# Patient Record
Sex: Male | Born: 1935 | Race: White | Hispanic: No | Marital: Married | Smoking: Former smoker
Health system: Southern US, Community
[De-identification: ages and names within clinical notes are randomized; demographics above are authoritative.]

## PROBLEM LIST (undated history)

## (undated) ENCOUNTER — Emergency Department (HOSPITAL_COMMUNITY): Payer: Medicare Other | Source: Home / Self Care

## (undated) DIAGNOSIS — R972 Elevated prostate specific antigen [PSA]: Secondary | ICD-10-CM

## (undated) DIAGNOSIS — I272 Pulmonary hypertension, unspecified: Secondary | ICD-10-CM

## (undated) DIAGNOSIS — I4821 Permanent atrial fibrillation: Secondary | ICD-10-CM

## (undated) DIAGNOSIS — C61 Malignant neoplasm of prostate: Secondary | ICD-10-CM

## (undated) DIAGNOSIS — D126 Benign neoplasm of colon, unspecified: Secondary | ICD-10-CM

## (undated) DIAGNOSIS — Z8669 Personal history of other diseases of the nervous system and sense organs: Secondary | ICD-10-CM

## (undated) DIAGNOSIS — Z953 Presence of xenogenic heart valve: Secondary | ICD-10-CM

## (undated) DIAGNOSIS — G2581 Restless legs syndrome: Secondary | ICD-10-CM

## (undated) DIAGNOSIS — K2270 Barrett's esophagus without dysplasia: Secondary | ICD-10-CM

## (undated) DIAGNOSIS — R739 Hyperglycemia, unspecified: Secondary | ICD-10-CM

## (undated) DIAGNOSIS — I10 Essential (primary) hypertension: Secondary | ICD-10-CM

## (undated) DIAGNOSIS — R609 Edema, unspecified: Secondary | ICD-10-CM

## (undated) DIAGNOSIS — Z952 Presence of prosthetic heart valve: Secondary | ICD-10-CM

## (undated) DIAGNOSIS — E0590 Thyrotoxicosis, unspecified without thyrotoxic crisis or storm: Secondary | ICD-10-CM

## (undated) DIAGNOSIS — Z8679 Personal history of other diseases of the circulatory system: Secondary | ICD-10-CM

## (undated) DIAGNOSIS — Z950 Presence of cardiac pacemaker: Secondary | ICD-10-CM

## (undated) DIAGNOSIS — K219 Gastro-esophageal reflux disease without esophagitis: Secondary | ICD-10-CM

## (undated) DIAGNOSIS — D509 Iron deficiency anemia, unspecified: Secondary | ICD-10-CM

## (undated) DIAGNOSIS — Z9889 Other specified postprocedural states: Secondary | ICD-10-CM

## (undated) DIAGNOSIS — R296 Repeated falls: Secondary | ICD-10-CM

## (undated) DIAGNOSIS — D131 Benign neoplasm of stomach: Secondary | ICD-10-CM

## (undated) DIAGNOSIS — I495 Sick sinus syndrome: Secondary | ICD-10-CM

## (undated) DIAGNOSIS — C4491 Basal cell carcinoma of skin, unspecified: Secondary | ICD-10-CM

## (undated) DIAGNOSIS — I872 Venous insufficiency (chronic) (peripheral): Secondary | ICD-10-CM

## (undated) DIAGNOSIS — I350 Nonrheumatic aortic (valve) stenosis: Secondary | ICD-10-CM

## (undated) DIAGNOSIS — Z951 Presence of aortocoronary bypass graft: Secondary | ICD-10-CM

## (undated) DIAGNOSIS — Z8719 Personal history of other diseases of the digestive system: Secondary | ICD-10-CM

## (undated) DIAGNOSIS — I739 Peripheral vascular disease, unspecified: Secondary | ICD-10-CM

## (undated) DIAGNOSIS — E785 Hyperlipidemia, unspecified: Secondary | ICD-10-CM

## (undated) DIAGNOSIS — I059 Rheumatic mitral valve disease, unspecified: Secondary | ICD-10-CM

## (undated) DIAGNOSIS — I2510 Atherosclerotic heart disease of native coronary artery without angina pectoris: Secondary | ICD-10-CM

## (undated) DIAGNOSIS — I5032 Chronic diastolic (congestive) heart failure: Secondary | ICD-10-CM

## (undated) DIAGNOSIS — I255 Ischemic cardiomyopathy: Secondary | ICD-10-CM

## (undated) DIAGNOSIS — F411 Generalized anxiety disorder: Principal | ICD-10-CM

## (undated) DIAGNOSIS — W19XXXA Unspecified fall, initial encounter: Secondary | ICD-10-CM

## (undated) HISTORY — DX: Sick sinus syndrome: I49.5

## (undated) HISTORY — PX: CYSTECTOMY: SHX5119

## (undated) HISTORY — DX: Elevated prostate specific antigen (PSA): R97.20

## (undated) HISTORY — DX: Restless legs syndrome: G25.81

## (undated) HISTORY — DX: Generalized anxiety disorder: F41.1

## (undated) HISTORY — DX: Hyperglycemia, unspecified: R73.9

## (undated) HISTORY — DX: Gastro-esophageal reflux disease without esophagitis: K21.9

## (undated) HISTORY — DX: Atherosclerotic heart disease of native coronary artery without angina pectoris: I25.10

## (undated) HISTORY — DX: Edema, unspecified: R60.9

## (undated) HISTORY — DX: Nonrheumatic aortic (valve) stenosis: I35.0

## (undated) HISTORY — PX: TONSILLECTOMY: SUR1361

## (undated) HISTORY — DX: Venous insufficiency (chronic) (peripheral): I87.2

## (undated) HISTORY — DX: Basal cell carcinoma of skin, unspecified: C44.91

## (undated) HISTORY — DX: Presence of xenogenic heart valve: Z95.3

## (undated) HISTORY — DX: Ischemic cardiomyopathy: I25.5

## (undated) HISTORY — DX: Iron deficiency anemia, unspecified: D50.9

## (undated) HISTORY — DX: Barrett's esophagus without dysplasia: K22.70

## (undated) HISTORY — DX: Presence of aortocoronary bypass graft: Z95.1

## (undated) HISTORY — DX: Other specified postprocedural states: Z98.890

## (undated) HISTORY — DX: Hyperlipidemia, unspecified: E78.5

## (undated) HISTORY — DX: Personal history of other diseases of the circulatory system: Z86.79

## (undated) HISTORY — DX: Essential (primary) hypertension: I10

## (undated) HISTORY — DX: Benign neoplasm of stomach: D13.1

## (undated) HISTORY — DX: Benign neoplasm of colon, unspecified: D12.6

## (undated) HISTORY — DX: Personal history of other diseases of the nervous system and sense organs: Z86.69

## (undated) HISTORY — DX: Peripheral vascular disease, unspecified: I73.9

## (undated) HISTORY — PX: SKIN CANCER EXCISION: SHX779

---

## 1990-11-01 HISTORY — PX: CARDIAC CATHETERIZATION: SHX172

## 1999-07-08 ENCOUNTER — Encounter (INDEPENDENT_AMBULATORY_CARE_PROVIDER_SITE_OTHER): Payer: Self-pay

## 1999-07-08 ENCOUNTER — Other Ambulatory Visit: Admission: RE | Admit: 1999-07-08 | Discharge: 1999-07-08 | Payer: Self-pay | Admitting: Gastroenterology

## 1999-11-10 ENCOUNTER — Ambulatory Visit (HOSPITAL_COMMUNITY): Admission: RE | Admit: 1999-11-10 | Discharge: 1999-11-10 | Payer: Self-pay | Admitting: Endocrinology

## 1999-11-10 ENCOUNTER — Encounter: Payer: Self-pay | Admitting: Endocrinology

## 2001-07-01 ENCOUNTER — Ambulatory Visit (HOSPITAL_BASED_OUTPATIENT_CLINIC_OR_DEPARTMENT_OTHER): Admission: RE | Admit: 2001-07-01 | Discharge: 2001-07-01 | Payer: Self-pay | Admitting: Endocrinology

## 2002-11-06 ENCOUNTER — Emergency Department (HOSPITAL_COMMUNITY): Admission: EM | Admit: 2002-11-06 | Discharge: 2002-11-06 | Payer: Self-pay | Admitting: Emergency Medicine

## 2004-07-06 ENCOUNTER — Observation Stay (HOSPITAL_COMMUNITY): Admission: EM | Admit: 2004-07-06 | Discharge: 2004-07-08 | Payer: Self-pay

## 2004-07-12 ENCOUNTER — Emergency Department (HOSPITAL_COMMUNITY): Admission: EM | Admit: 2004-07-12 | Discharge: 2004-07-12 | Payer: Self-pay | Admitting: Emergency Medicine

## 2004-08-03 ENCOUNTER — Ambulatory Visit (HOSPITAL_COMMUNITY): Admission: RE | Admit: 2004-08-03 | Discharge: 2004-08-03 | Payer: Self-pay | Admitting: General Surgery

## 2004-09-07 ENCOUNTER — Ambulatory Visit: Payer: Self-pay | Admitting: Gastroenterology

## 2004-09-13 ENCOUNTER — Ambulatory Visit: Payer: Self-pay | Admitting: Endocrinology

## 2005-05-01 ENCOUNTER — Emergency Department (HOSPITAL_COMMUNITY): Admission: EM | Admit: 2005-05-01 | Discharge: 2005-05-01 | Payer: Self-pay | Admitting: Emergency Medicine

## 2005-05-05 ENCOUNTER — Ambulatory Visit: Payer: Self-pay | Admitting: Endocrinology

## 2005-05-09 ENCOUNTER — Ambulatory Visit (HOSPITAL_COMMUNITY): Admission: RE | Admit: 2005-05-09 | Discharge: 2005-05-09 | Payer: Self-pay | Admitting: Endocrinology

## 2005-05-09 ENCOUNTER — Ambulatory Visit: Payer: Self-pay | Admitting: Endocrinology

## 2005-08-23 ENCOUNTER — Ambulatory Visit: Payer: Self-pay | Admitting: Endocrinology

## 2005-09-01 ENCOUNTER — Ambulatory Visit: Payer: Self-pay | Admitting: Gastroenterology

## 2005-09-08 ENCOUNTER — Ambulatory Visit: Payer: Self-pay | Admitting: Endocrinology

## 2005-09-09 HISTORY — PX: CARDIOVASCULAR STRESS TEST: SHX262

## 2005-09-15 ENCOUNTER — Ambulatory Visit: Payer: Self-pay | Admitting: Endocrinology

## 2005-11-15 ENCOUNTER — Ambulatory Visit: Payer: Self-pay | Admitting: Endocrinology

## 2005-11-25 ENCOUNTER — Ambulatory Visit: Payer: Self-pay | Admitting: Endocrinology

## 2005-12-01 ENCOUNTER — Ambulatory Visit (HOSPITAL_COMMUNITY): Admission: RE | Admit: 2005-12-01 | Discharge: 2005-12-01 | Payer: Self-pay | Admitting: Orthopedic Surgery

## 2006-02-09 ENCOUNTER — Ambulatory Visit: Payer: Self-pay | Admitting: Endocrinology

## 2006-02-10 ENCOUNTER — Ambulatory Visit: Admission: RE | Admit: 2006-02-10 | Discharge: 2006-05-11 | Payer: Self-pay | Admitting: Radiation Oncology

## 2006-02-22 ENCOUNTER — Ambulatory Visit: Payer: Self-pay | Admitting: Gastroenterology

## 2006-03-21 ENCOUNTER — Ambulatory Visit: Payer: Self-pay | Admitting: Gastroenterology

## 2006-03-21 ENCOUNTER — Encounter (INDEPENDENT_AMBULATORY_CARE_PROVIDER_SITE_OTHER): Payer: Self-pay | Admitting: Specialist

## 2006-04-05 ENCOUNTER — Ambulatory Visit: Payer: Self-pay | Admitting: Endocrinology

## 2006-05-12 ENCOUNTER — Ambulatory Visit: Admission: RE | Admit: 2006-05-12 | Discharge: 2006-08-10 | Payer: Self-pay | Admitting: Radiation Oncology

## 2006-08-23 ENCOUNTER — Ambulatory Visit: Payer: Self-pay | Admitting: Endocrinology

## 2006-11-03 ENCOUNTER — Ambulatory Visit: Payer: Self-pay | Admitting: Gastroenterology

## 2006-11-15 ENCOUNTER — Ambulatory Visit: Payer: Self-pay | Admitting: Gastroenterology

## 2006-11-15 LAB — HM COLONOSCOPY

## 2007-01-08 ENCOUNTER — Ambulatory Visit: Payer: Self-pay | Admitting: Endocrinology

## 2007-06-01 ENCOUNTER — Encounter: Payer: Self-pay | Admitting: Endocrinology

## 2007-06-01 DIAGNOSIS — I2510 Atherosclerotic heart disease of native coronary artery without angina pectoris: Secondary | ICD-10-CM

## 2007-06-01 DIAGNOSIS — I10 Essential (primary) hypertension: Secondary | ICD-10-CM

## 2007-06-01 DIAGNOSIS — K219 Gastro-esophageal reflux disease without esophagitis: Secondary | ICD-10-CM

## 2007-06-01 DIAGNOSIS — E785 Hyperlipidemia, unspecified: Secondary | ICD-10-CM

## 2007-06-01 HISTORY — DX: Gastro-esophageal reflux disease without esophagitis: K21.9

## 2007-06-01 HISTORY — DX: Essential (primary) hypertension: I10

## 2007-06-01 HISTORY — DX: Atherosclerotic heart disease of native coronary artery without angina pectoris: I25.10

## 2007-06-01 HISTORY — DX: Hyperlipidemia, unspecified: E78.5

## 2007-06-09 ENCOUNTER — Emergency Department (HOSPITAL_COMMUNITY): Admission: EM | Admit: 2007-06-09 | Discharge: 2007-06-09 | Payer: Self-pay | Admitting: Emergency Medicine

## 2007-06-19 ENCOUNTER — Ambulatory Visit: Payer: Self-pay | Admitting: Endocrinology

## 2007-06-25 ENCOUNTER — Emergency Department (HOSPITAL_COMMUNITY): Admission: EM | Admit: 2007-06-25 | Discharge: 2007-06-25 | Payer: Self-pay | Admitting: Emergency Medicine

## 2007-07-02 ENCOUNTER — Ambulatory Visit: Payer: Self-pay | Admitting: Endocrinology

## 2007-07-11 ENCOUNTER — Encounter (HOSPITAL_BASED_OUTPATIENT_CLINIC_OR_DEPARTMENT_OTHER): Admission: RE | Admit: 2007-07-11 | Discharge: 2007-08-07 | Payer: Self-pay | Admitting: Surgery

## 2007-08-07 ENCOUNTER — Encounter (HOSPITAL_BASED_OUTPATIENT_CLINIC_OR_DEPARTMENT_OTHER): Admission: RE | Admit: 2007-08-07 | Discharge: 2007-09-24 | Payer: Self-pay | Admitting: Surgery

## 2007-08-16 ENCOUNTER — Ambulatory Visit: Payer: Self-pay | Admitting: Endocrinology

## 2007-08-23 ENCOUNTER — Ambulatory Visit: Payer: Self-pay | Admitting: Internal Medicine

## 2007-09-18 ENCOUNTER — Encounter: Payer: Self-pay | Admitting: Endocrinology

## 2008-01-01 ENCOUNTER — Encounter: Payer: Self-pay | Admitting: Endocrinology

## 2008-01-01 HISTORY — PX: CARDIOVASCULAR STRESS TEST: SHX262

## 2008-01-10 ENCOUNTER — Encounter: Payer: Self-pay | Admitting: Endocrinology

## 2008-01-14 DIAGNOSIS — Z8669 Personal history of other diseases of the nervous system and sense organs: Secondary | ICD-10-CM

## 2008-01-14 DIAGNOSIS — D126 Benign neoplasm of colon, unspecified: Secondary | ICD-10-CM

## 2008-01-14 DIAGNOSIS — Z8546 Personal history of malignant neoplasm of prostate: Secondary | ICD-10-CM | POA: Insufficient documentation

## 2008-01-14 HISTORY — DX: Benign neoplasm of colon, unspecified: D12.6

## 2008-01-14 HISTORY — DX: Personal history of other diseases of the nervous system and sense organs: Z86.69

## 2008-01-16 ENCOUNTER — Encounter: Admission: RE | Admit: 2008-01-16 | Discharge: 2008-01-16 | Payer: Self-pay | Admitting: Cardiovascular Disease

## 2008-01-21 ENCOUNTER — Ambulatory Visit (HOSPITAL_COMMUNITY): Admission: RE | Admit: 2008-01-21 | Discharge: 2008-01-21 | Payer: Self-pay | Admitting: Cardiology

## 2008-01-21 ENCOUNTER — Encounter (INDEPENDENT_AMBULATORY_CARE_PROVIDER_SITE_OTHER): Payer: Self-pay | Admitting: Cardiology

## 2008-02-29 ENCOUNTER — Ambulatory Visit (HOSPITAL_COMMUNITY): Admission: RE | Admit: 2008-02-29 | Discharge: 2008-02-29 | Payer: Self-pay | Admitting: *Deleted

## 2008-04-16 ENCOUNTER — Encounter: Payer: Self-pay | Admitting: Endocrinology

## 2008-04-17 ENCOUNTER — Encounter: Payer: Self-pay | Admitting: Endocrinology

## 2008-05-21 ENCOUNTER — Encounter: Payer: Self-pay | Admitting: Endocrinology

## 2008-06-13 ENCOUNTER — Encounter: Payer: Self-pay | Admitting: Endocrinology

## 2008-06-13 ENCOUNTER — Encounter: Payer: Self-pay | Admitting: Cardiovascular Disease

## 2008-06-13 DIAGNOSIS — I059 Rheumatic mitral valve disease, unspecified: Secondary | ICD-10-CM

## 2008-06-13 DIAGNOSIS — Z951 Presence of aortocoronary bypass graft: Secondary | ICD-10-CM

## 2008-06-13 DIAGNOSIS — Z8679 Personal history of other diseases of the circulatory system: Secondary | ICD-10-CM

## 2008-06-13 DIAGNOSIS — Z953 Presence of xenogenic heart valve: Secondary | ICD-10-CM

## 2008-06-13 HISTORY — DX: Personal history of other diseases of the circulatory system: Z86.79

## 2008-06-13 HISTORY — DX: Presence of xenogenic heart valve: Z95.3

## 2008-06-13 HISTORY — PX: MITRAL VALVE REPLACEMENT: SHX147

## 2008-06-13 HISTORY — PX: CORONARY ARTERY BYPASS GRAFT: SHX141

## 2008-06-13 HISTORY — DX: Rheumatic mitral valve disease, unspecified: I05.9

## 2008-06-13 HISTORY — DX: Presence of aortocoronary bypass graft: Z95.1

## 2008-06-25 ENCOUNTER — Encounter: Payer: Self-pay | Admitting: Endocrinology

## 2008-06-29 ENCOUNTER — Encounter: Payer: Self-pay | Admitting: Endocrinology

## 2008-07-13 ENCOUNTER — Encounter: Payer: Self-pay | Admitting: Endocrinology

## 2008-07-13 DIAGNOSIS — I495 Sick sinus syndrome: Secondary | ICD-10-CM

## 2008-07-13 HISTORY — DX: Sick sinus syndrome: I49.5

## 2008-07-13 HISTORY — PX: PACEMAKER PLACEMENT: SHX43

## 2008-07-15 ENCOUNTER — Encounter: Payer: Self-pay | Admitting: Endocrinology

## 2008-07-23 ENCOUNTER — Encounter: Payer: Self-pay | Admitting: Endocrinology

## 2008-07-28 ENCOUNTER — Encounter: Payer: Self-pay | Admitting: Endocrinology

## 2008-08-01 ENCOUNTER — Ambulatory Visit: Payer: Self-pay | Admitting: Cardiothoracic Surgery

## 2008-08-07 ENCOUNTER — Ambulatory Visit: Payer: Self-pay | Admitting: Endocrinology

## 2008-08-08 ENCOUNTER — Ambulatory Visit: Payer: Self-pay | Admitting: Cardiothoracic Surgery

## 2008-08-08 ENCOUNTER — Encounter: Admission: RE | Admit: 2008-08-08 | Discharge: 2008-08-08 | Payer: Self-pay | Admitting: Cardiothoracic Surgery

## 2008-08-12 ENCOUNTER — Ambulatory Visit: Payer: Self-pay | Admitting: Endocrinology

## 2008-08-15 ENCOUNTER — Encounter: Payer: Self-pay | Admitting: Endocrinology

## 2008-08-18 ENCOUNTER — Telehealth (INDEPENDENT_AMBULATORY_CARE_PROVIDER_SITE_OTHER): Payer: Self-pay | Admitting: *Deleted

## 2008-08-18 DIAGNOSIS — Z8679 Personal history of other diseases of the circulatory system: Secondary | ICD-10-CM | POA: Insufficient documentation

## 2008-08-18 HISTORY — DX: Personal history of other diseases of the circulatory system: Z86.79

## 2008-08-20 ENCOUNTER — Encounter: Payer: Self-pay | Admitting: Endocrinology

## 2008-08-22 ENCOUNTER — Ambulatory Visit: Payer: Self-pay | Admitting: Cardiothoracic Surgery

## 2008-08-26 ENCOUNTER — Encounter: Payer: Self-pay | Admitting: Endocrinology

## 2008-08-28 ENCOUNTER — Encounter (HOSPITAL_COMMUNITY): Admission: RE | Admit: 2008-08-28 | Discharge: 2008-11-05 | Payer: Self-pay | Admitting: Cardiovascular Disease

## 2008-09-04 ENCOUNTER — Ambulatory Visit: Payer: Self-pay | Admitting: Endocrinology

## 2008-09-04 ENCOUNTER — Telehealth (INDEPENDENT_AMBULATORY_CARE_PROVIDER_SITE_OTHER): Payer: Self-pay | Admitting: *Deleted

## 2008-09-04 DIAGNOSIS — F411 Generalized anxiety disorder: Secondary | ICD-10-CM | POA: Insufficient documentation

## 2008-09-04 DIAGNOSIS — S8000XA Contusion of unspecified knee, initial encounter: Secondary | ICD-10-CM

## 2008-09-04 DIAGNOSIS — S81819A Laceration without foreign body, unspecified lower leg, initial encounter: Secondary | ICD-10-CM | POA: Insufficient documentation

## 2008-09-04 HISTORY — DX: Generalized anxiety disorder: F41.1

## 2008-09-08 LAB — CONVERTED CEMR LAB
Basophils Absolute: 0 10*3/uL (ref 0.0–0.1)
Basophils Relative: 0.1 % (ref 0.0–3.0)
MCHC: 33.6 g/dL (ref 30.0–36.0)
MCV: 85.1 fL (ref 78.0–100.0)
Monocytes Absolute: 0.6 10*3/uL (ref 0.1–1.0)
Monocytes Relative: 10.6 % (ref 3.0–12.0)
RDW: 15 % — ABNORMAL HIGH (ref 11.5–14.6)
TSH: 1.25 microintl units/mL (ref 0.35–5.50)

## 2008-09-09 ENCOUNTER — Encounter (INDEPENDENT_AMBULATORY_CARE_PROVIDER_SITE_OTHER): Payer: Self-pay | Admitting: *Deleted

## 2008-09-14 ENCOUNTER — Ambulatory Visit: Payer: Self-pay | Admitting: Internal Medicine

## 2008-09-14 ENCOUNTER — Inpatient Hospital Stay (HOSPITAL_COMMUNITY): Admission: EM | Admit: 2008-09-14 | Discharge: 2008-09-16 | Payer: Self-pay | Admitting: Emergency Medicine

## 2008-09-15 ENCOUNTER — Encounter (INDEPENDENT_AMBULATORY_CARE_PROVIDER_SITE_OTHER): Payer: Self-pay | Admitting: Internal Medicine

## 2008-09-15 ENCOUNTER — Ambulatory Visit: Payer: Self-pay | Admitting: Vascular Surgery

## 2008-09-18 ENCOUNTER — Ambulatory Visit: Payer: Self-pay | Admitting: Endocrinology

## 2008-09-18 DIAGNOSIS — L03119 Cellulitis of unspecified part of limb: Secondary | ICD-10-CM

## 2008-09-18 DIAGNOSIS — I872 Venous insufficiency (chronic) (peripheral): Secondary | ICD-10-CM | POA: Insufficient documentation

## 2008-09-18 DIAGNOSIS — D649 Anemia, unspecified: Secondary | ICD-10-CM

## 2008-09-18 DIAGNOSIS — L02419 Cutaneous abscess of limb, unspecified: Secondary | ICD-10-CM | POA: Insufficient documentation

## 2008-09-18 HISTORY — DX: Venous insufficiency (chronic) (peripheral): I87.2

## 2008-09-18 LAB — CONVERTED CEMR LAB
Basophils Absolute: 0 10*3/uL (ref 0.0–0.1)
HCT: 29.8 % — ABNORMAL LOW (ref 39.0–52.0)
Hemoglobin: 10.2 g/dL — ABNORMAL LOW (ref 13.0–17.0)
Iron: 32 ug/dL — ABNORMAL LOW (ref 42–165)
Lymphocytes Relative: 13 % (ref 12.0–46.0)
MCHC: 34.2 g/dL (ref 30.0–36.0)
Platelets: 316 10*3/uL (ref 150–400)
RBC: 3.56 M/uL — ABNORMAL LOW (ref 4.22–5.81)
WBC: 7.3 10*3/uL (ref 4.5–10.5)

## 2008-09-22 ENCOUNTER — Encounter: Payer: Self-pay | Admitting: Endocrinology

## 2008-09-22 ENCOUNTER — Encounter (HOSPITAL_BASED_OUTPATIENT_CLINIC_OR_DEPARTMENT_OTHER): Admission: RE | Admit: 2008-09-22 | Discharge: 2008-10-17 | Payer: Self-pay | Admitting: Internal Medicine

## 2008-10-13 ENCOUNTER — Telehealth: Payer: Self-pay | Admitting: Endocrinology

## 2008-10-13 ENCOUNTER — Ambulatory Visit: Payer: Self-pay | Admitting: Endocrinology

## 2008-10-13 DIAGNOSIS — J069 Acute upper respiratory infection, unspecified: Secondary | ICD-10-CM | POA: Insufficient documentation

## 2008-10-13 DIAGNOSIS — D509 Iron deficiency anemia, unspecified: Secondary | ICD-10-CM

## 2008-10-13 DIAGNOSIS — J90 Pleural effusion, not elsewhere classified: Secondary | ICD-10-CM | POA: Insufficient documentation

## 2008-10-13 HISTORY — DX: Iron deficiency anemia, unspecified: D50.9

## 2008-10-13 LAB — CONVERTED CEMR LAB
Basophils Relative: 2.3 % (ref 0.0–3.0)
CO2: 28 meq/L (ref 19–32)
Chloride: 107 meq/L (ref 96–112)
Creatinine, Ser: 0.8 mg/dL (ref 0.4–1.5)
Eosinophils Absolute: 0 10*3/uL (ref 0.0–0.7)
Eosinophils Relative: 0 % (ref 0.0–5.0)
Glucose, Bld: 116 mg/dL — ABNORMAL HIGH (ref 70–99)
HCT: 31.6 % — ABNORMAL LOW (ref 39.0–52.0)
Iron: 9 ug/dL — ABNORMAL LOW (ref 42–165)
Lymphocytes Relative: 5.2 % — ABNORMAL LOW (ref 12.0–46.0)
MCV: 82.6 fL (ref 78.0–100.0)
Monocytes Absolute: 0.5 10*3/uL (ref 0.1–1.0)
Neutrophils Relative %: 88.2 % — ABNORMAL HIGH (ref 43.0–77.0)
RDW: 16.6 % — ABNORMAL HIGH (ref 11.5–14.6)
Saturation Ratios: 3.5 % — ABNORMAL LOW (ref 20.0–50.0)
Sodium: 142 meq/L (ref 135–145)
WBC: 11.5 10*3/uL — ABNORMAL HIGH (ref 4.5–10.5)

## 2008-10-14 ENCOUNTER — Telehealth (INDEPENDENT_AMBULATORY_CARE_PROVIDER_SITE_OTHER): Payer: Self-pay | Admitting: *Deleted

## 2008-10-14 ENCOUNTER — Ambulatory Visit: Payer: Self-pay | Admitting: Cardiothoracic Surgery

## 2008-10-15 ENCOUNTER — Ambulatory Visit: Payer: Self-pay | Admitting: Endocrinology

## 2008-10-16 ENCOUNTER — Ambulatory Visit: Payer: Self-pay | Admitting: Endocrinology

## 2008-10-21 ENCOUNTER — Ambulatory Visit: Payer: Self-pay | Admitting: Endocrinology

## 2008-10-21 ENCOUNTER — Ambulatory Visit: Payer: Self-pay

## 2008-10-21 DIAGNOSIS — R319 Hematuria, unspecified: Secondary | ICD-10-CM | POA: Insufficient documentation

## 2008-10-21 DIAGNOSIS — R609 Edema, unspecified: Secondary | ICD-10-CM | POA: Insufficient documentation

## 2008-10-21 LAB — CONVERTED CEMR LAB
BUN: 16 mg/dL (ref 6–23)
Bilirubin Urine: NEGATIVE
CO2: 30 meq/L (ref 19–32)
Calcium: 8.6 mg/dL (ref 8.4–10.5)
Creatinine, Ser: 0.8 mg/dL (ref 0.4–1.5)
Eosinophils Relative: 1.5 % (ref 0.0–5.0)
Glucose, Urine, Semiquant: NEGATIVE
INR: 1.9 — ABNORMAL HIGH (ref 0.8–1.0)
Ketones, urine, test strip: NEGATIVE
MCV: 81.5 fL (ref 78.0–100.0)
Neutrophils Relative %: 71.7 % (ref 43.0–77.0)
Nitrite: NEGATIVE
Platelets: 245 10*3/uL (ref 150–400)
Potassium: 4 meq/L (ref 3.5–5.1)
Pro B Natriuretic peptide (BNP): 186 pg/mL — ABNORMAL HIGH (ref 0.0–100.0)
Protein, U semiquant: 30
Prothrombin Time: 19.7 s — ABNORMAL HIGH (ref 10.9–13.3)
RBC: 3.8 M/uL — ABNORMAL LOW (ref 4.22–5.81)
RDW: 16.5 % — ABNORMAL HIGH (ref 11.5–14.6)
Sodium: 143 meq/L (ref 135–145)
Specific Gravity, Urine: >=1.03
Urobilinogen, UA: 0.2
pH: 5

## 2008-10-22 ENCOUNTER — Encounter: Payer: Self-pay | Admitting: Endocrinology

## 2008-11-04 ENCOUNTER — Telehealth (INDEPENDENT_AMBULATORY_CARE_PROVIDER_SITE_OTHER): Payer: Self-pay | Admitting: *Deleted

## 2008-11-07 ENCOUNTER — Encounter (HOSPITAL_COMMUNITY): Admission: RE | Admit: 2008-11-07 | Discharge: 2009-01-05 | Payer: Self-pay | Admitting: Cardiovascular Disease

## 2008-11-13 ENCOUNTER — Ambulatory Visit: Payer: Self-pay | Admitting: Endocrinology

## 2008-11-13 LAB — CONVERTED CEMR LAB
Eosinophils Absolute: 0.1 10*3/uL (ref 0.0–0.7)
HCT: 35.5 % — ABNORMAL LOW (ref 39.0–52.0)
Hemoglobin: 12 g/dL — ABNORMAL LOW (ref 13.0–17.0)
Iron: 72 ug/dL (ref 42–165)
MCV: 84.8 fL (ref 78.0–100.0)
Neutro Abs: 3.8 10*3/uL (ref 1.4–7.7)
RBC: 4.19 M/uL — ABNORMAL LOW (ref 4.22–5.81)
RDW: 19.1 % — ABNORMAL HIGH (ref 11.5–14.6)
WBC: 5.5 10*3/uL (ref 4.5–10.5)

## 2008-11-18 ENCOUNTER — Ambulatory Visit (HOSPITAL_COMMUNITY): Admission: RE | Admit: 2008-11-18 | Discharge: 2008-11-18 | Payer: Self-pay | Admitting: Cardiovascular Disease

## 2008-11-28 ENCOUNTER — Ambulatory Visit: Payer: Self-pay | Admitting: Internal Medicine

## 2008-11-28 ENCOUNTER — Encounter: Payer: Self-pay | Admitting: Endocrinology

## 2008-12-12 ENCOUNTER — Encounter: Payer: Self-pay | Admitting: Endocrinology

## 2009-02-18 ENCOUNTER — Encounter (INDEPENDENT_AMBULATORY_CARE_PROVIDER_SITE_OTHER): Payer: Self-pay | Admitting: *Deleted

## 2009-02-24 ENCOUNTER — Encounter: Payer: Self-pay | Admitting: Internal Medicine

## 2009-03-20 ENCOUNTER — Ambulatory Visit: Payer: Self-pay | Admitting: Internal Medicine

## 2009-03-20 DIAGNOSIS — K2270 Barrett's esophagus without dysplasia: Secondary | ICD-10-CM

## 2009-03-20 HISTORY — DX: Barrett's esophagus without dysplasia: K22.70

## 2009-03-26 ENCOUNTER — Encounter: Payer: Self-pay | Admitting: Endocrinology

## 2009-03-26 ENCOUNTER — Telehealth: Payer: Self-pay | Admitting: Internal Medicine

## 2009-03-30 ENCOUNTER — Telehealth (INDEPENDENT_AMBULATORY_CARE_PROVIDER_SITE_OTHER): Payer: Self-pay | Admitting: *Deleted

## 2009-04-02 ENCOUNTER — Ambulatory Visit: Payer: Self-pay | Admitting: Endocrinology

## 2009-04-02 DIAGNOSIS — R040 Epistaxis: Secondary | ICD-10-CM

## 2009-04-02 LAB — CONVERTED CEMR LAB
Basophils Absolute: 0 10*3/uL (ref 0.0–0.1)
Eosinophils Absolute: 0.1 10*3/uL (ref 0.0–0.7)
Hemoglobin: 11.7 g/dL — ABNORMAL LOW (ref 13.0–17.0)
Iron: 70 ug/dL (ref 42–165)
Lymphocytes Relative: 19.3 % (ref 12.0–46.0)
Lymphs Abs: 1 10*3/uL (ref 0.7–4.0)
Monocytes Relative: 10.7 % (ref 3.0–12.0)
Neutrophils Relative %: 68.6 % (ref 43.0–77.0)
RBC: 3.8 M/uL — ABNORMAL LOW (ref 4.22–5.81)
RDW: 14 % (ref 11.5–14.6)
Saturation Ratios: 25.4 % (ref 20.0–50.0)
Transferrin: 196.7 mg/dL — ABNORMAL LOW (ref 212.0–360.0)

## 2009-04-28 ENCOUNTER — Ambulatory Visit: Payer: Self-pay | Admitting: Internal Medicine

## 2009-04-28 ENCOUNTER — Encounter: Payer: Self-pay | Admitting: Internal Medicine

## 2009-05-04 ENCOUNTER — Telehealth: Payer: Self-pay | Admitting: Internal Medicine

## 2009-05-13 ENCOUNTER — Encounter: Payer: Self-pay | Admitting: Endocrinology

## 2009-05-13 ENCOUNTER — Encounter: Payer: Self-pay | Admitting: Internal Medicine

## 2009-05-13 DIAGNOSIS — D131 Benign neoplasm of stomach: Secondary | ICD-10-CM

## 2009-05-13 HISTORY — DX: Benign neoplasm of stomach: D13.1

## 2009-05-15 ENCOUNTER — Ambulatory Visit: Payer: Self-pay | Admitting: Internal Medicine

## 2009-05-18 ENCOUNTER — Telehealth: Payer: Self-pay | Admitting: Endocrinology

## 2009-05-21 ENCOUNTER — Ambulatory Visit: Payer: Self-pay | Admitting: Endocrinology

## 2009-05-21 ENCOUNTER — Telehealth: Payer: Self-pay | Admitting: Endocrinology

## 2009-05-21 DIAGNOSIS — R05 Cough: Secondary | ICD-10-CM | POA: Insufficient documentation

## 2009-05-22 ENCOUNTER — Ambulatory Visit: Payer: Self-pay | Admitting: Internal Medicine

## 2009-05-22 ENCOUNTER — Ambulatory Visit (HOSPITAL_COMMUNITY): Admission: RE | Admit: 2009-05-22 | Discharge: 2009-05-22 | Payer: Self-pay | Admitting: Internal Medicine

## 2009-05-22 ENCOUNTER — Encounter: Payer: Self-pay | Admitting: Internal Medicine

## 2009-05-22 ENCOUNTER — Telehealth: Payer: Self-pay | Admitting: Endocrinology

## 2009-05-25 ENCOUNTER — Encounter: Payer: Self-pay | Admitting: Internal Medicine

## 2009-05-28 ENCOUNTER — Telehealth: Payer: Self-pay | Admitting: Internal Medicine

## 2009-06-03 ENCOUNTER — Telehealth: Payer: Self-pay | Admitting: Internal Medicine

## 2009-06-11 ENCOUNTER — Encounter: Payer: Self-pay | Admitting: Endocrinology

## 2009-07-10 ENCOUNTER — Encounter: Payer: Self-pay | Admitting: Endocrinology

## 2009-07-14 HISTORY — PX: COLONOSCOPY: SHX174

## 2009-07-14 HISTORY — PX: UPPER GASTROINTESTINAL ENDOSCOPY: SHX188

## 2009-07-30 DIAGNOSIS — R609 Edema, unspecified: Secondary | ICD-10-CM

## 2009-07-30 DIAGNOSIS — R600 Localized edema: Secondary | ICD-10-CM

## 2009-07-30 HISTORY — DX: Edema, unspecified: R60.9

## 2009-07-30 HISTORY — DX: Localized edema: R60.0

## 2009-08-18 ENCOUNTER — Ambulatory Visit: Payer: Self-pay | Admitting: Endocrinology

## 2009-12-31 ENCOUNTER — Telehealth: Payer: Self-pay | Admitting: Internal Medicine

## 2010-01-01 ENCOUNTER — Ambulatory Visit: Payer: Self-pay | Admitting: Endocrinology

## 2010-01-01 ENCOUNTER — Encounter: Payer: Self-pay | Admitting: Endocrinology

## 2010-01-01 ENCOUNTER — Ambulatory Visit: Payer: Self-pay | Admitting: Internal Medicine

## 2010-01-01 DIAGNOSIS — Z860100 Personal history of colon polyps, unspecified: Secondary | ICD-10-CM | POA: Insufficient documentation

## 2010-01-02 LAB — CONVERTED CEMR LAB
AST: 22 units/L (ref 0–37)
Alkaline Phosphatase: 66 units/L (ref 39–117)
Basophils Relative: 0.1 % (ref 0.0–3.0)
Bilirubin, Direct: 0.2 mg/dL (ref 0.0–0.3)
CO2: 28 meq/L (ref 19–32)
Cholesterol: 109 mg/dL (ref 0–200)
GFR calc non Af Amer: 68.66 mL/min (ref >60)
Glucose, Bld: 101 mg/dL — ABNORMAL HIGH (ref 70–99)
Hemoglobin: 11.2 g/dL — ABNORMAL LOW (ref 13.0–17.0)
Lymphocytes Relative: 9.3 % — ABNORMAL LOW (ref 12.0–46.0)
Monocytes Relative: 8.6 % (ref 3.0–12.0)
Neutro Abs: 6.3 10*3/uL (ref 1.4–7.7)
Neutrophils Relative %: 81 % — ABNORMAL HIGH (ref 43.0–77.0)
Saturation Ratios: 3.9 % — ABNORMAL LOW (ref 20.0–50.0)
Sodium: 143 meq/L (ref 135–145)
TSH: 0.76 microintl units/mL (ref 0.35–5.50)
Total Protein: 6.1 g/dL (ref 6.0–8.3)
Triglycerides: 29 mg/dL (ref 0.0–149.0)
VLDL: 5.8 mg/dL (ref 0.0–40.0)
WBC: 7.8 10*3/uL (ref 4.5–10.5)

## 2010-01-21 ENCOUNTER — Encounter: Payer: Self-pay | Admitting: Endocrinology

## 2010-03-11 ENCOUNTER — Telehealth: Payer: Self-pay | Admitting: Endocrinology

## 2010-04-14 ENCOUNTER — Encounter (INDEPENDENT_AMBULATORY_CARE_PROVIDER_SITE_OTHER): Payer: Self-pay | Admitting: *Deleted

## 2010-04-27 ENCOUNTER — Encounter: Payer: Self-pay | Admitting: Endocrinology

## 2010-05-24 ENCOUNTER — Ambulatory Visit: Payer: Self-pay | Admitting: Internal Medicine

## 2010-05-24 ENCOUNTER — Encounter (INDEPENDENT_AMBULATORY_CARE_PROVIDER_SITE_OTHER): Payer: Self-pay | Admitting: *Deleted

## 2010-06-03 ENCOUNTER — Encounter: Payer: Self-pay | Admitting: Endocrinology

## 2010-06-03 HISTORY — PX: CARDIOVASCULAR STRESS TEST: SHX262

## 2010-06-11 ENCOUNTER — Encounter: Payer: Self-pay | Admitting: Endocrinology

## 2010-06-16 DIAGNOSIS — I739 Peripheral vascular disease, unspecified: Secondary | ICD-10-CM

## 2010-06-16 HISTORY — DX: Peripheral vascular disease, unspecified: I73.9

## 2010-06-24 ENCOUNTER — Telehealth: Payer: Self-pay | Admitting: Endocrinology

## 2010-06-29 ENCOUNTER — Ambulatory Visit: Payer: Self-pay | Admitting: Internal Medicine

## 2010-07-02 ENCOUNTER — Encounter: Payer: Self-pay | Admitting: Internal Medicine

## 2010-07-06 ENCOUNTER — Encounter: Payer: Self-pay | Admitting: Endocrinology

## 2010-08-12 ENCOUNTER — Ambulatory Visit: Payer: Self-pay | Admitting: Endocrinology

## 2010-08-26 ENCOUNTER — Encounter: Payer: Self-pay | Admitting: Endocrinology

## 2010-09-28 ENCOUNTER — Ambulatory Visit: Payer: Self-pay | Admitting: Endocrinology

## 2010-09-28 DIAGNOSIS — C4491 Basal cell carcinoma of skin, unspecified: Secondary | ICD-10-CM

## 2010-09-28 HISTORY — DX: Basal cell carcinoma of skin, unspecified: C44.91

## 2010-09-28 LAB — CONVERTED CEMR LAB
Basophils Absolute: 0 10*3/uL (ref 0.0–0.1)
Eosinophils Absolute: 0.1 10*3/uL (ref 0.0–0.7)
Hemoglobin: 12.9 g/dL — ABNORMAL LOW (ref 13.0–17.0)
Lymphocytes Relative: 20.9 % (ref 12.0–46.0)
MCV: 93.5 fL (ref 78.0–100.0)
Monocytes Absolute: 0.6 10*3/uL (ref 0.1–1.0)
Monocytes Relative: 10.1 % (ref 3.0–12.0)
Neutro Abs: 3.7 10*3/uL (ref 1.4–7.7)
Platelets: 169 10*3/uL (ref 150.0–400.0)
RBC: 4.03 M/uL — ABNORMAL LOW (ref 4.22–5.81)
TSH: 1.32 microintl units/mL (ref 0.35–5.50)
Transferrin: 250.5 mg/dL (ref 212.0–360.0)
WBC: 5.5 10*3/uL (ref 4.5–10.5)

## 2010-10-22 ENCOUNTER — Ambulatory Visit: Payer: Self-pay | Admitting: Endocrinology

## 2010-10-22 DIAGNOSIS — L738 Other specified follicular disorders: Secondary | ICD-10-CM | POA: Insufficient documentation

## 2010-11-05 ENCOUNTER — Telehealth: Payer: Self-pay | Admitting: Internal Medicine

## 2010-12-09 NOTE — Assessment & Plan Note (Signed)
Summary: constipation/rectal bleeding , pt on coumadin/sheri    History of Present Illness Visit Type: Follow-up Visit Primary GI MD: Stan Head MD [facility] Primary Provider: Romero Belling, MD Chief Complaint: constipation and rectal bleeding pt. was seen today by Dr. Everardo All for cold symtpoms and placed on Antibiotic History of Present Illness:   77 YO MALE KNOWN TO DR. Leone Payor WITH HX OF BARRETTS ESOPHAGUS,DIVERTICULAR DISEASE,HX OF COLON POLYPS,AND EXTERNAL HEMORRHOIDS. HE IS ON COUMADIN FOR MITRAL VALVE REPLACEMENT.HE COMES IN TODAY WITH C/O CONSTIPATION AND RECTAL BLEEDING. HE HAS HAD PROBLEMS WITH CONSTIPATION FOR THE PAST FEW MONTHS DESPITE USING FIBER SUPPLEMENT,AND STOOL SOFTENERS REGULARLY. HE HAD A HARD STOOL 2 DAYS AGO, AND AFTER THAT STARTED HAVING DARK RED BLOOD AND SMALL CLOTS PER RECTUM.HE HAS NO C/O ABDOMINAL PAIN,DISCOMFORT,NO RECTAL DISCOMFORT.   GI Review of Systems      Denies abdominal pain, acid reflux, belching, bloating, chest pain, dysphagia with liquids, dysphagia with solids, heartburn, loss of appetite, nausea, vomiting, vomiting blood, and  weight loss.      Reports change in bowel habits, constipation, diverticulosis, heme positive stool, hemorrhoids, and  rectal bleeding.     Denies anal fissure, black tarry stools, diarrhea, fecal incontinence, irritable bowel syndrome, jaundice, light color stool, liver problems, and  rectal pain.    Current Medications (verified): 1)  Protonix 40 Mg  Tbec (Pantoprazole Sodium) .... Take 1 Tablet By Mouth Two Times A Day 2)  Bayer Low Strength 81 Mg  Tbec (Aspirin) .... Take 1 By Mouth Qd 3)  Digoxin 0.125 Mg Tabs (Digoxin) .... 1/2 By Mouth Once Daily 4)  Coumadin 3 Mg Tabs (Warfarin Sodium) .Marland Kitchen.. 1 Tablet By Mouth As Directed 5)  Metoprolol Tartrate 25 Mg Tabs (Metoprolol Tartrate) .Marland Kitchen.. 1 Tablet By Mouth Two Times A Day 6)  Amiodarone Hcl 200 Mg Tabs (Amiodarone Hcl) .Marland Kitchen.. 1 Tablet By Mouth Once Daily 7)  Calcium 600/vitamin  D 600-400 Mg-Unit Tabs (Calcium Carbonate-Vitamin D) .Marland Kitchen.. 1 Tablet By Mouth Two Times A Day 8)  Simvastatin 40 Mg Tabs (Simvastatin) .Marland Kitchen.. 1 Qhs 9)  Cefuroxime Axetil 250 Mg Tabs (Cefuroxime Axetil) .Marland Kitchen.. 1 Qd  Allergies (verified): No Known Drug Allergies  Past History:  Past Medical History: Coronary artery disease Mitral Regurgitation s/p MVR, bioprosthetic GERD BARRETTS ESOPHAGUS DIVERTICULOSIS HX COLON POLYPS Hyperlipidemia Hypertension Pipe smoker (quit 1980) Barrett's Esophagus Chronic atrophy left testicle Restless leg syndrome Hyperglycemia Elevated PSA  Past Surgical History: Reviewed history from 05/13/2009 and no changes required. Cardiac catherization (11/01/1990) Stress Cardiolite (07/04/1996) EKG (05/17/2004) Prostatectomy Tonsillectomy Left leg cystectomy Pacemaker placement ([place]) CABG, S Mitral Valve Replacement, bioprosthetic, Maze procedure 8/09 [facility]  Family History: Reviewed history from 03/20/2009 and no changes required. No FH of Colon Cancer: Family History of Breast Cancer:mother  Social History: Reviewed history from 03/20/2009 and no changes required. 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         ROS OTHERWISE AS IN HPI  Vital Signs:  Patient profile:   77 year old male Height:  70 inches Weight:      173.38  pounds BMI:     24.97 Pulse rate:   80 / minute Pulse rhythm:   regular BP sitting:   128 / 94  (left arm)  Vitals Entered By: Milford Cage NCMA (January 01, 2010 11:13 AM)  Physical Exam  General:  Well developed, well nourished, no acute distress. Head:  Normocephalic and atraumatic. Eyes:  PERRLA, no icterus. Lungs:  Clear throughout to auscultation. Heart:  Regular rate and rhythm; no murmurs, rubs,  or bruits.,PROSTHETIC VALVE SOUND Abdomen:  SOFT, NONTENDER, NO MASS OR HSM,BS+ Rectal:  EXTERNAL HEMORRHOID,NONTENDER, RED BLOOD ON GLOVE NO STOOL IN VAULT Extremities:  MULTIPLE ECCHYMOSIS Neurologic:  Alert and  oriented x4;  grossly normal neurologically. Psych:  Alert and cooperative. Normal mood and affect.   Impression & Recommendations:  Problem # 1:  RECTAL BLEEDING (ICD-569.3) Assessment New 77 YO MALE WITH 2 DAY HX OF RECTAL BLEEDING  SECONDARY TO EXTERNAL HEMORRHOID-EXACERBATED BY CONSTIPATION AND COUMADIN USE.  START ANUSOL HC SUPP AT BEDTIME DAILY X 10 DAYS THEN AS NEEDED. START MIRALAX 17 GM DAILY IN 8 OZ WATER-MAY STOP STOOL SOFTENER CONTINUE EFFORTS AT HIGH FIBER DIET AND INCREASING FLUIDS. FOLLOW UP WITH DRGESSNER IN 2-3 WEEKS IF SXS PERSIST.  Problem # 2:  PERSONAL HX COLONIC POLYPS (ICD-V12.72) Assessment: Comment Only LAST COLONOSCOPY 1/08-NO POLYPS;DUE FOR FOLLOW UP 11/2011  Problem # 3:  MITRAL VALVE REPLACEMENT, HX OF; ON COUMADIN (ICD-V15.1) Assessment: Comment Only  Problem # 4:  BARRETTS ESOPHAGUS (ICD-530.85) Assessment: Comment Only LAST EGD 7/10  Problem # 5:  CEREBROVASCULAR ACCIDENT, HX OF (ICD-V12.50) Assessment: Comment Only  Problem # 6:  PROSTATE CANCER, HX OF (ICD-V10.46) Assessment: Comment Only  Patient Instructions: 1)  Take Miralax 17 grams in 8 0z of water daily. 2)  I sent a perscription for Anusol HC Suppositories. 3)  Use as directed. 4)  You are due for a follow up Colonoscopy in 11/2011.  5)  Call for a follow up if  still having problems in 2 weeks. 6)  Copy sent to : Romero Belling, MD 7)  The medication list was reviewed and reconciled.  All changed / newly prescribed medications were explained.  A complete medication list was provided to the patient / caregiver. Prescriptions: ANUSOL-HC 25 MG SUPP (HYDROCORTISONE ACETATE) Use 1 suppository  at bedtime for 10 days and then as needed  #10 x 1   Entered by:   Lowry Ram NCMA   Authorized by:   Sammuel Cooper PA-c   Signed by:   Lowry Ram NCMA on 01/01/2010   Method used:   [facility]  [phone]* (retail)       [address] [place], [place]  [address]       Ph: [phone] or [phone]       Fax: [phone]   RxID:   [id number]

## 2010-12-09 NOTE — Letter (Signed)
Summary: [facility]  [facility]   Imported By: Sherian Rein 06/16/2010 15:06:55  _____________________________________________________________________  External Attachment:    Type:   Image     Comment:   External Document

## 2010-12-09 NOTE — Miscellaneous (Signed)
Summary: Orders Update   Clinical Lists Changes  Orders: Added new Referral order of Psychology Referral (Psychology) - Signed    wife gives me a paper with a list of pt's psychological sxs.  she feels he would be resistent to seeing psychologist.  after discussion with pt, he agrees to see psychology.  i have discarded the paper, because wife gives to me and not to him.

## 2010-12-09 NOTE — Consult Note (Signed)
Summary: [facility]  [facility]   Imported By: Sherian Rein 07/13/2010 13:25:19  _____________________________________________________________________  External Attachment:    Type:   Image     Comment:   External Document

## 2010-12-09 NOTE — Procedures (Signed)
Summary: Upper Endoscopy  Patient: [name] Note: All result statuses are Final unless otherwise noted.  Tests: (1) Upper Endoscopy (EGD)   EGD Upper Endoscopy       DONE     [facility]     [address]     [place], [place]  [address]           ENDOSCOPY PROCEDURE REPORT           PATIENT:  [name] [name], [name] [name]  MR#:  [id number]     BIRTHDATE:  15-Oct-1929, 81 yrs. old  GENDER:  male           ENDOSCOPIST:  Iva Boop, MD, [facility]     Referred by:           PROCEDURE DATE:  06/29/2010     PROCEDURE:  EGD with biopsy     ASA CLASS:  Class II     INDICATIONS:  h/o Barrett's Esophagus - surveillance EGD, atypia     seen on last biopsies 2010           MEDICATIONS:   Fentanyl 25 mcg IV, Versed 3 mg IV     TOPICAL ANESTHETIC:  Exactacain Spray           DESCRIPTION OF PROCEDURE:   After the risks benefits and     alternatives of the procedure were thoroughly explained, informed     consent was obtained.  The LB [id number] endoscope was     introduced through the mouth and advanced to the second portion of     the duodenum, without limitations.  The instrument was slowly     withdrawn as the mucosa was fully examined.     <<PROCEDUREIMAGES>>           Barrett's esophagus was found in the distal esophagus. Two tongues     and multiple tiny islands of columnar mucosa. 38-40 cm. Nothing     suspicious seen. Multiple biopsies were obtained and sent to     pathology.  A hiatal hernia was found. It was 10 cm in size. 40-50     cm.  Otherwise the examination was normal.    Retroflexed views     also revealed a hiatal hernia.    The scope was then withdrawn     from the patient and the procedure completed.           COMPLICATIONS:  None           ENDOSCOPIC IMPRESSION:     1) Barrett's esophagus in the distal esophagus (38-40 cm)     2) 10 cm hiatal hernia     3) Otherwise normal examination     RECOMMENDATIONS:     1) Await biopsy results           REPEAT EXAM:   await biopsy results, future health status and these     will determine plan for next routine endoscopy.           Iva Boop, MD, [facility]           CC:  The Patient           n.     eSIGNED:   Iva Boop at 06/29/2010 10:31 AM           [name] [name], [id number]  Note: An exclamation mark (!) indicates a result that was not dispersed into the flowsheet. Document Creation Date: 06/29/2010 10:33 AM _______________________________________________________________________  Marland Kitchen  1) Order result status: Final Collection or observation date-time: 06/29/2010 10:22 Requested date-time:  Receipt date-time:  Reported date-time:  Referring Physician:   Ordering Physician: Stan Head [phone]) Specimen Source:  Source: Launa Grill Order Number: [id number] Lab site:   Appended Document: Upper Endoscopy     Procedures Next Due Date:    EGD: 07/2011

## 2010-12-09 NOTE — Letter (Signed)
Summary: Office Visit Letter  [facility]  [address] [place], [place] [address]   Phone: [phone]  Fax: [phone]      April 14, 2010 MRN: [id number]   DOMINICO ROD [address] [place], [place]  [address]   Dear Mr. WANLESS,   According to our records, it is time for you to schedule a follow-up office visit with [place].   At your convenience, please call [phone] (option #2)to schedule an office visit. If you have any questions, concerns, or feel that this letter is in error, we would appreciate your call.   Sincerely,    Iva Boop, M.D.  [facility] [phone]

## 2010-12-09 NOTE — Assessment & Plan Note (Signed)
Summary: FLU SHOT/NWS  Nurse Visit   Allergies: No Known Drug Allergies  Orders Added: 1)  Flu Vaccine 3yrs + MEDICARE PATIENTS [Q2039] 2)  Administration Flu vaccine - MCR [G0008] Flu Vaccine Consent Questions     Do you have a history of severe allergic reactions to this vaccine? no    Any prior history of allergic reactions to egg and/or gelatin? no    Do you have a sensitivity to the preservative Thimersol? no    Do you have a past history of Guillan-Barre Syndrome? no    Do you currently have an acute febrile illness? no    Have you ever had a severe reaction to latex? no    Vaccine information given and explained to patient? yes    Are you currently pregnant? no    Lot Number:[id number]   Exp Date:05/07/2011   Site Given  Left Deltoid IM 

## 2010-12-09 NOTE — Assessment & Plan Note (Signed)
Summary: COLD/ SORE THROAT/ NO FEVER/NWS   Vital Signs:  Patient profile:   77 year old male Height:      70 inches (177.80 cm) Weight:      175 pounds (79.55 kg) O2 Sat:      93 % on Room air Temp:     97.5 degrees F (36.39 degrees C) oral Pulse rate:   85 / minute BP sitting:   118 / 74  (left arm) Cuff size:   regular  Vitals Entered By: Josph Macho RMA (January 01, 2010 8:12 AM)  O2 Flow:  Room air CC: Cold, sore throat, no fever X6days/ CF   Primary Provider:  Romero Belling, MD  CC:  Cold, sore throat, and no fever X6days/ CF.  History of Present Illness: see above, x 4 days.   Current Medications (verified): 1)  Protonix 40 Mg  Tbec (Pantoprazole Sodium) .... Take 1 Tablet By Mouth Two Times A Day 2)  Bayer Low Strength 81 Mg  Tbec (Aspirin) .... Take 1 By Mouth Qd 3)  Digoxin 0.125 Mg Tabs (Digoxin) .... 1/2 By Mouth Once Daily 4)  Iron 325 (65 Fe) Mg Tabs (Ferrous Sulfate) .... Take 2 Two Times A Day 5)  Astepro 0.15 % Soln (Azelastine Hcl) .... 2 Spray/side Once Daily 6)  Coumadin 3 Mg Tabs (Warfarin Sodium) .Marland Kitchen.. 1 Tablet By Mouth As Directed 7)  Metoprolol Tartrate 25 Mg Tabs (Metoprolol Tartrate) .Marland Kitchen.. 1 Tablet By Mouth Two Times A Day 8)  Amiodarone Hcl 200 Mg Tabs (Amiodarone Hcl) .Marland Kitchen.. 1 Tablet By Mouth Once Daily 9)  Citracal Plus  Tabs (Multiple Minerals-Vitamins) .Marland Kitchen.. 1 Tablet By Mouth Once Daily 10)  Calcium 600/vitamin D 600-400 Mg-Unit Tabs (Calcium Carbonate-Vitamin D) .Marland Kitchen.. 1 Tablet By Mouth Two Times A Day 11)  Vitamin D3 1000 Unit Caps (Cholecalciferol) .Marland Kitchen.. 1 Capsule By Mouth Once Daily 12)  Cefuroxime Axetil 250 Mg Tabs (Cefuroxime Axetil) .Marland Kitchen.. 1 Bid 13)  Simvastatin 40 Mg Tabs (Simvastatin) .Marland Kitchen.. 1 Qhs  Allergies (verified): No Known Drug Allergies  Past History:  Past Medical History: Last updated: 05/13/2009 Coronary artery disease Mitral Regurgitation s/p MVR, bioprosthetic GERD Hyperlipidemia Hypertension Pipe smoker (quit  1980) Barrett's Esophagus Chronic atrophy left testicle Restless leg syndrome Hyperglycemia Elevated PSA Heart Disease  Review of Systems  The patient denies fever.    Physical Exam  General:  normal appearance.   Head:  head: no deformity eyes: no periorbital swelling, no proptosis external nose and ears are normal mouth: no lesion seen   Impression & Recommendations:  Problem # 1:  COUGH (ICD-786.2) due to uri  Medications Added to Medication List This Visit: 1)  Cefuroxime Axetil 250 Mg Tabs (Cefuroxime axetil) .Marland Kitchen.. 1 qd  Other Orders: TLB-Lipid Panel (80061-LIPID) TLB-TSH (Thyroid Stimulating Hormone) (84443-TSH) TLB-IBC Pnl (Iron/FE;Transferrin) (83550-IBC) TLB-BMP (Basic Metabolic Panel-BMET) (80048-METABOL) TLB-CBC Platelet - w/Differential (85025-CBCD) TLB-Hepatic/Liver Function Pnl (80076-HEPATIC) TLB-PSA (Prostate Specific Antigen) (84153-PSA) T-2 View CXR (71020TC) Est. Patient Level III (01601)  Patient Instructions: 1)  chest x ray and blood tests today. 2)  cefuroxime 250 mg two times a day. 3)  physical soon. Prescriptions: CEFUROXIME AXETIL 250 MG TABS (CEFUROXIME AXETIL) 1 qd  #14 x 0   Entered and Authorized by:   Minus Breeding MD   Signed by:   Minus Breeding MD on 01/01/2010   Method used:   [facility]  [address]  [phone]* (retail)  [address] [place], [place]  [address]       Ph: [phone] or [phone]       Fax: [phone]   RxID:   [id number]   Appended Document: Orders Update     Clinical Lists Changes  Orders: Added new Service order of Prescription Created Electronically [phone]) - Signed

## 2010-12-09 NOTE — Assessment & Plan Note (Signed)
Summary: LUMPS IN ARM PIT /NWS   Vital Signs:  Patient profile:   77 year old male Height:      70 inches (177.80 cm) Weight:      180.25 pounds (81.93 kg) BMI:     25.96 O2 Sat:      96 % on Room air Temp:     98.4 degrees F (36.89 degrees C) oral Pulse rate:   88 / minute BP sitting:   110 / 74  (left arm) Cuff size:   regular  Vitals Entered By: Brenton Grills CMA Duncan Dull) (October 22, 2010 9:45 AM)  O2 Flow:  Room air CC: Lumps in left arm pit area/tender to touch/aj Is Patient Diabetic? No   Referring Provider:  n/a Primary Provider:  Romero Belling, MD  CC:  Lumps in left arm pit area/tender to touch/aj.  History of Present Illness: pt states few weeks of slight lumps at the left axilla, and assoc pain  Current Medications (verified): 1)  Bayer Low Strength 81 Mg  Tbec (Aspirin) .... Take 1 By Mouth Qd 2)  Digoxin 0.125 Mg Tabs (Digoxin) .... 1/2 By Mouth Once Daily 3)  Coumadin 3 Mg Tabs (Warfarin Sodium) .Marland Kitchen.. 1 Tablet By Mouth As Directed 4)  Metoprolol Tartrate 25 Mg Tabs (Metoprolol Tartrate) .Marland Kitchen.. 1 Tablet By Mouth Two Times A Day 5)  Amiodarone Hcl 200 Mg Tabs (Amiodarone Hcl) .Marland Kitchen.. 1 Tablet By Mouth Once Daily 6)  Simvastatin 40 Mg Tabs (Simvastatin) .Marland Kitchen.. 1 Qhs 7)  Pantoprazole Sodium 40 Mg Tbec (Pantoprazole Sodium) .Marland Kitchen.. 1 By Mouth Two Times A Day 8)  Centrum Silver Ultra Mens  Tabs (Multiple Vitamins-Minerals) .Marland Kitchen.. 1 By Mouth Once Daily 9)  Iron 325 (65 Fe) Mg Tabs (Ferrous Sulfate) .Marland Kitchen.. 1 By Mouth Once Daily  Allergies (verified): No Known Drug Allergies  Past History:  Past Medical History: Last updated: 05/24/2010 Coronary artery disease Mitral Regurgitation s/p MVR, bioprosthetic GERD BARRETTS ESOPHAGUS DIVERTICULOSIS HX COLON POLYPS Hyperlipidemia Hypertension Pipe smoker (quit 1980) Barrett's Esophagus Chronic atrophy left testicle Restless leg syndrome Hyperglycemia Elevated PSA squemas cell skin cancer  Review of Systems  The patient  denies fever.         denies itching at the axillae  Physical Exam  General:  normal appearance.   Chest Wall:  mild to moderate folliculitis at the left axilla   Impression & Recommendations:  Problem # 1:  FOLLICULITIS (ICD-704.8) Assessment New  Problem # 2:  coumadin rx this presents a risk of doxycycline rx  Medications Added to Medication List This Visit: 1)  Amoxicillin-pot Clavulanate 500-125 Mg Tabs (Amoxicillin-pot clavulanate) .Marland Kitchen.. 1 tab three times a day  Other Orders: Est. Patient Level IV ([address])  Patient Instructions: 1)  augmentin, 1 tab three times a day. 2)  return in a few weeks if it is not better. Prescriptions: AMOXICILLIN-POT CLAVULANATE 500-125 MG TABS (AMOXICILLIN-POT CLAVULANATE) 1 tab three times a day  #21 x 0   Entered and Authorized by:   Minus Breeding MD   Signed by:   Minus Breeding MD on 10/22/2010   Method used:   [facility]  [phone]* (retail)       [address] [place], [place]  [address]       Ph: [phone] or [phone]       Fax: [phone]   RxID:   [id number]    Orders Added:  1)  Est. Patient Level IV [[address]]

## 2010-12-09 NOTE — Letter (Signed)
Summary: Patient Notice-Barrett's [facility]  [address] [place], [place] [address]   Phone: [phone]  Fax: [phone]        July 02, 2010 MRN: [id number]    GRAEME MENEES [address] [place], [place]  [address]    Dear Mr. YOAKUM,  I am pleased to inform you that the biopsies taken during your recent endoscopic examination did not show any evidence of cancer upon pathologic examination.  However, your biopsies indicate you have a condition known as Barrett's esophagus. While not cancer, it is pre-cancerous (can progress to cancer) and needs to be monitored with repeat endoscopic examination and biopsies.  Fortunately, it is quite rare that this develops into cancer, but careful monitoring of the condition along with taking your medication as prescribed is important in reducing the risk of developing cancer.  It is my recommendation that you have a routine repeat office visit in 1 year. At this point, we will see how you are doing as time goes on and whether it makes sense to continue routine endoscopy follow-up. The earliest I would recommend that would be 3 years, 2014.  Please call us if you have or develop heartburn, reflux symptoms, any swallowing problems, or if you have questions about your condition that have not been fully answered at this time.   Sincerely,  Iva Boop MD, [facility]  This letter has been electronically signed by your physician.  Appended Document: Patient Notice-Barrett's Esopghagus letter mailed

## 2010-12-09 NOTE — Progress Notes (Signed)
Summary: Triage   Phone Note Call from Patient Call back at Home Phone [phone]   Caller: Patient Call For: Fredderick Phenix Reason for Call: Talk to Nurse Summary of Call: Pt said he is bleeding from his rectum. Pt. is constipated Initial call taken by: Karna Christmas,  December 31, 2009 10:06 AM  Follow-up for Phone Call        Patient  c/o constipation he has been trying OTC stool softener with no improvement.  I have advised him to start on  Miralax 1-2 doses a day.  He reports bright red rectal bleeding and is on coumadin.  Dr Leone Payor is it ok to call in some suppositories or rectal cream .  Please advise if you would like him evlauated or ok for rx. Follow-up by: Darcey Nora RN, CGRN,  December 31, 2009 11:02 AM  Additional Follow-up for Phone Call Additional follow up Details #1::        It is best that he be evaluated in the office Iva Boop MD, [facility]  December 31, 2009 11:15 AM     Additional Follow-up for Phone Call Additional follow up Details #2::    Patient  scheduled with Mike Gip PA 01-01-10 11:00 Follow-up by: Darcey Nora RN, CGRN,  December 31, 2009 11:48 AM

## 2010-12-09 NOTE — Progress Notes (Signed)
Summary: Rx refill request  Phone Note Call from Patient Call back at Home Phone [phone]   Caller: Patient Summary of Call: pt came into clinic requesting refill of Simvastin 40mg  #90. Initial call taken by: Margaret Pyle, CMA,  Mar 11, 2010 2:37 PM    Prescriptions: SIMVASTATIN 40 MG TABS (SIMVASTATIN) 1 qhs  #90 x 0   Entered by:   Margaret Pyle, CMA   Authorized by:   Minus Breeding MD   Signed by:   Margaret Pyle, CMA on 03/11/2010   Method used:   Electronically to        [facility]* (mail-order)             ,          Ph: [phone]       Fax: [phone]   RxID:   [id number]

## 2010-12-09 NOTE — Letter (Signed)
Summary: External Correspondence  External Correspondence   Imported By: Lester [place] 05/13/2010 10:11:28  _____________________________________________________________________  External Attachment:    Type:   Image     Comment:   External Document

## 2010-12-09 NOTE — Progress Notes (Signed)
Summary: Rx refill req  Phone Note Refill Request Message from:  Patient on June 24, 2010 10:20 AM  Refills Requested: Medication #1:  DIGOXIN 0.125 MG TABS 1/2 by mouth once daily   Dosage confirmed as above?Dosage Confirmed   Supply Requested: 3 months  Method Requested: Fax to [facility] Initial call taken by: Margaret Pyle, CMA,  June 24, 2010 10:20 AM    Prescriptions: DIGOXIN 0.125 MG TABS (DIGOXIN) 1/2 by mouth once daily  #45 x 0   Entered by:   Margaret Pyle, CMA   Authorized by:   Minus Breeding MD   Signed by:   Margaret Pyle, CMA on 06/24/2010   Method used:   Electronically to        [facility]* (retail)             ,          Ph: [phone]       Fax: [phone]   RxID:   [id number]

## 2010-12-09 NOTE — Letter (Signed)
Summary: [facility]   Imported By: Sherian Rein 09/10/2010 10:52:19  _____________________________________________________________________  External Attachment:    Type:   Image     Comment:   External Document

## 2010-12-09 NOTE — Letter (Signed)
Summary: [facility]   Imported By: Lester [place] 06/16/2010 07:45:06  _____________________________________________________________________  External Attachment:    Type:   Image     Comment:   External Document

## 2010-12-09 NOTE — Progress Notes (Signed)
Summary: HA/SAE pt  Phone Note Call from Patient Call back at Home Phone [phone]   Caller: Patient Summary of Call: Pt called stating he has had HA on and off x 4 days, no fever, pain or weakness. HA is not radiating. please advise Initial call taken by: Margaret Pyle, CMA,  November 05, 2010 9:13 AM  Follow-up for Phone Call        hard to say much from just this information, but OK for tylenol as needed , but should consider OV or ER for persistent or worsening pain, or any relation to blurred vision, n/v, balance issue or facial or extremity weakness Follow-up by: Corwin Levins MD,  November 05, 2010 9:21 AM  Additional Follow-up for Phone Call Additional follow up Details #1::        called patient informed of above information. Additional Follow-up by: Robin Ewing CMA Duncan Dull),  November 05, 2010 2:13 PM

## 2010-12-09 NOTE — Letter (Signed)
Summary: EGD Instructions  [facility]  [address] [place], [place] [address]   Phone: [phone]  Fax: [phone]       [name] [name]    03/09/1929    MRN: [id number]       Procedure Day Dorna Bloom: [name], 06/29/10     Arrival Time: 1:00 PM     Procedure Time: 2:00 PM     Location of Procedure:                    _X_ [facility] (4th Floor)  PREPARATION FOR ENDOSCOPY   On TUESDAY, 06/29/10 THE DAY OF THE PROCEDURE:  1.   No solid foods, milk or milk products are allowed after midnight the night before your procedure.  2.   Do not drink anything colored red or purple.  Avoid juices with pulp.  No orange juice.  3.  You may drink clear liquids until 12:00 PM, which is 2 hours before your procedure.                                                                                                CLEAR LIQUIDS INCLUDE: Water Jello Ice Popsicles Tea (sugar ok, no milk/cream) Powdered fruit flavored drinks Coffee (sugar ok, no milk/cream) Gatorade Juice: apple, white grape, white cranberry  Lemonade Clear bullion, consomm, broth Carbonated beverages (any kind) Strained chicken noodle soup Hard Candy   MEDICATION INSTRUCTIONS  Unless otherwise instructed, you should take regular prescription medications with a small sip of water as early as possible the morning of your procedure.  Do not hold your Coumadin for your procedure per Dr. Leone Payor.                OTHER INSTRUCTIONS  You will need a responsible adult at least 75 years of age to accompany you and drive you home.   This person must remain in the waiting room during your procedure.  Wear loose fitting clothing that is easily removed.  Leave jewelry and other valuables at home.  However, you may wish to bring a book to read or an iPod/MP3 player to listen to music as you wait for your procedure to start.  Remove all body piercing jewelry and leave at home.  Total time from  sign-in until discharge is approximately 2-3 hours.  You should go home directly after your procedure and rest.  You can resume normal activities the day after your procedure.  The day of your procedure you should not:   Drive   Make legal decisions   Operate machinery   Drink alcohol   Return to work  You will receive specific instructions about eating, activities and medications before you leave.    The above instructions have been reviewed and explained to me by   _______________________    I fully understand and can verbalize these instructions _____________________________ Date _________
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
## 2010-12-09 NOTE — Assessment & Plan Note (Signed)
Summary: PER PT PER DR SAE SCHED YEARLY--[facility]   Vital Signs:  Patient profile:   77 year old male Height:      70 inches (177.80 cm) Weight:      179.38 pounds (81.54 kg) BMI:     25.83 O2 Sat:      96 % on Room air Temp:     98.2 degrees F (36.78 degrees C) oral Pulse rate:   88 / minute BP sitting:   124 / 72  (left arm) Cuff size:   regular  Vitals Entered By: Brenton Grills CMA Duncan Dull) (September 28, 2010 9:53 AM)  O2 Flow:  Room air CC: Yearly F/U/aj Is Patient Diabetic? No   Referring Provider:  n/a Primary Provider:  Romero Belling, MD  CC:  Yearly F/U/aj.  History of Present Illness: here for regular wellness examination.  He's feeling pretty well in general, and does not smoke. alcohol is rare.  he now takes fe 1/day.  Current Medications (verified): 1)  Bayer Low Strength 81 Mg  Tbec (Aspirin) .... Take 1 By Mouth Qd 2)  Digoxin 0.125 Mg Tabs (Digoxin) .... 1/2 By Mouth Once Daily 3)  Coumadin 3 Mg Tabs (Warfarin Sodium) .Marland Kitchen.. 1 Tablet By Mouth As Directed 4)  Metoprolol Tartrate 25 Mg Tabs (Metoprolol Tartrate) .Marland Kitchen.. 1 Tablet By Mouth Two Times A Day 5)  Amiodarone Hcl 200 Mg Tabs (Amiodarone Hcl) .Marland Kitchen.. 1 Tablet By Mouth Once Daily 6)  Simvastatin 40 Mg Tabs (Simvastatin) .Marland Kitchen.. 1 Qhs 7)  Pantoprazole Sodium 40 Mg Tbec (Pantoprazole Sodium) .Marland Kitchen.. 1 By Mouth Two Times A Day 8)  Centrum Silver Ultra Mens  Tabs (Multiple Vitamins-Minerals) .Marland Kitchen.. 1 By Mouth Once Daily 9)  Iron 325 (65 Fe) Mg Tabs (Ferrous Sulfate) .Marland Kitchen.. 1 By Mouth Once Daily  Allergies (verified): No Known Drug Allergies  Past History:  Past Medical History: Last updated: 05/24/2010 Coronary artery disease Mitral Regurgitation s/p MVR, bioprosthetic GERD BARRETTS ESOPHAGUS DIVERTICULOSIS HX COLON POLYPS Hyperlipidemia Hypertension Pipe smoker (quit 1980) Barrett's Esophagus Chronic atrophy left testicle Restless leg syndrome Hyperglycemia Elevated PSA squemas cell skin cancer  Family  History: Reviewed history from 03/20/2009 and no changes required. No FH of Colon Cancer: Family History of Breast Cancer:mother  Social History: Reviewed history from 03/20/2009 and no changes required. 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  The patient denies fever, vision loss, decreased hearing, chest pain, syncope, dyspnea on exertion, prolonged cough, headaches, abdominal pain, melena, hematochezia, severe indigestion/heartburn, hematuria, suspicious skin lesions, and depression.    Physical Exam  General:  normal appearance.   Head:  head: no deformity eyes: no periorbital swelling, no proptosis external nose and ears are normal mouth: no lesion seen Neck:  Supple without thyroid enlargement or tenderness.  Chest Wall:  thers is kyphosis, and increased a-p diameter there is a healed median sternotomy scar pacemaker is present at the right anterior chest. Lungs:  Clear to auscultation bilaterally. Normal respiratory effort.  Heart:  Regular rate and rhythm without gallops noted. Normal S1,S2.  there is a high-pitched systolic murmur Abdomen:  abdomen is soft, nontender.  no hepatosplenomegaly.   not distended.  no hernia  Rectal:  sees urology  Genitalia:  sees urology  Prostate:  sees urology  Msk:  muscle bulk and strength are grossly normal.  no obvious joint swelling.  gait is normal and steady  Pulses:  dorsalis pedis intact bilat, but decreased from normal.  no carotid bruit Extremities:  there is severe cyanosis of the legs c/w chronic vanous insufficiency.   there are bilat varicosities no deformity.  no ulcer on the feet.   1+ right pedal edema, 1+ left pedal edema mycotic toenails.   Neurologic:  cn 2-12 grossly intact.   readily moves all 4's.   sensation is intact to touch on the feet  Skin:  normal texture and temp.  no rash.  there are  ecchymoses on the hands and forearms.  not diaphoretic.  there are multiple ak's  Cervical Nodes:  No significant adenopathy.  Psych:  Alert and cooperative; normal mood and affect; normal attention span and concentration.     Impression & Recommendations:  Problem # 1:  ROUTINE GENERAL MEDICAL EXAM@HEALTH  CARE FACL (ICD-V70.0)  Medications Added to Medication List This Visit: 1)  Iron 325 (65 Fe) Mg Tabs (Ferrous sulfate) .Marland Kitchen.. 1 by mouth once daily  Other Orders: TLB-TSH (Thyroid Stimulating Hormone) (84443-TSH) TLB-CBC Platelet - w/Differential (85025-CBCD) TLB-IBC Pnl (Iron/FE;Transferrin) (83550-IBC) Est. Patient 65& > (78295)  Patient Instructions: 1)  blood tests are being ordered for you today.  please call [phone] to hear your test results. 2)  please consider these measures for your health:  minimize alcohol.  do not use tobacco products.  have a colonoscopy at least every 10 years from age 76.  keep firearms safely stored.  always use seat belts.  have working smoke alarms in your home.  see an eye doctor and dentist regularly.  never drive under the influence of alcohol or drugs (including prescription drugs).  those with fair skin should take precautions against the sun. 3)  please let me know what your wishes would be, if artificial life support measures should become necessary.  it is critically important to prevent falling down (keep floor areas well-lit, dry, and free of loose objects). 4)  (update: we discussed code status.  pt requests full code, but would not want to be started or maintained on artificial life-support measures if there was not a reasonable chance of recovery) 5)  (update: i left message on phone-tree:  increase fe to 2/day   Orders Added: 1)  TLB-TSH (Thyroid Stimulating Hormone) [84443-TSH] 2)  TLB-CBC Platelet - w/Differential [85025-CBCD] 3)  TLB-IBC Pnl (Iron/FE;Transferrin) [83550-IBC] 4)  Est. Patient 65& > [57846]

## 2010-12-09 NOTE — Letter (Signed)
Summary: [facility]  [facility]   Imported By: Sherian Rein 01/26/2010 10:53:03  _____________________________________________________________________  External Attachment:    Type:   Image     Comment:   External Document

## 2010-12-17 ENCOUNTER — Encounter: Payer: Self-pay | Admitting: Endocrinology

## 2011-01-03 ENCOUNTER — Telehealth: Payer: Self-pay | Admitting: Endocrinology

## 2011-01-06 ENCOUNTER — Ambulatory Visit (INDEPENDENT_AMBULATORY_CARE_PROVIDER_SITE_OTHER): Payer: Medicare Other | Admitting: Endocrinology

## 2011-01-06 ENCOUNTER — Encounter: Payer: Self-pay | Admitting: Endocrinology

## 2011-01-06 DIAGNOSIS — E0590 Thyrotoxicosis, unspecified without thyrotoxic crisis or storm: Secondary | ICD-10-CM

## 2011-01-13 NOTE — Progress Notes (Signed)
Summary: OV due  Phone Note Outgoing Call Call back at [facility] Phone [phone]   Call placed by: Brenton Grills CMA Duncan Dull),  January 03, 2011 9:48 AM Call placed to: Patient Summary of Call: Per MD, pt is due for F/U OV. Left message for pt to callback office to schedule OV.

## 2011-01-13 NOTE — Assessment & Plan Note (Signed)
Summary: f/u appt/ (per phone call from ashley)cd   Vital Signs:  Patient profile:   77 year old male Weight:      175.0 pounds (79.55 kg) O2 Sat:      95 % on Room air Temp:     97.7 degrees F (36.50 degrees C) oral Pulse rate:   87 / minute BP sitting:   110 / 62  (left arm) Cuff size:   regular  Vitals Entered By: Orlan Leavens RMA (January 06, 2011 1:09 PM)  O2 Flow:  Room air CC: follow-up visit Is Patient Diabetic? No Pain Assessment Patient in pain? no        Referring Provider:  n/a Primary Provider:  Romero Belling, MD  CC:  follow-up visit.  History of Present Illness: pt was noted by dr Alanda Amass to have low tsh.  it was normal when he was lst here 3 mos ago, and pt says no h/o thyroid problems.  he denies palpitations, anxiety, and doe.   he has few years of moderate weakness of the muscles, worst at the thighs, but no assoc diaphoresis.  Current Medications (verified): 1)  Bayer Low Strength 81 Mg  Tbec (Aspirin) .... Take 1 By Mouth Qd 2)  Digoxin 0.125 Mg Tabs (Digoxin) .... 1/2 By Mouth Once Daily 3)  Coumadin 3 Mg Tabs (Warfarin Sodium) .Marland Kitchen.. 1 Tablet By Mouth As Directed 4)  Metoprolol Tartrate 25 Mg Tabs (Metoprolol Tartrate) .Marland Kitchen.. 1 Tablet By Mouth Two Times A Day 5)  Amiodarone Hcl 200 Mg Tabs (Amiodarone Hcl) .Marland Kitchen.. 1 Tablet By Mouth Once Daily 6)  Simvastatin 40 Mg Tabs (Simvastatin) .Marland Kitchen.. 1 Qhs 7)  Pantoprazole Sodium 40 Mg Tbec (Pantoprazole Sodium) .Marland Kitchen.. 1 By Mouth Two Times A Day 8)  Centrum Silver Ultra Mens  Tabs (Multiple Vitamins-Minerals) .Marland Kitchen.. 1 By Mouth Once Daily 9)  Iron 325 (65 Fe) Mg Tabs (Ferrous Sulfate) .Marland Kitchen.. 1 By Mouth Once Daily 10)  Amoxicillin-Pot Clavulanate 500-125 Mg Tabs (Amoxicillin-Pot Clavulanate) .Marland Kitchen.. 1 Tab Three Times A Day  Allergies (verified): No Known Drug Allergies  Past History:  Past Medical History: Last updated: 05/24/2010 Coronary artery disease Mitral Regurgitation s/p MVR, bioprosthetic GERD BARRETTS  ESOPHAGUS DIVERTICULOSIS HX COLON POLYPS Hyperlipidemia Hypertension Pipe smoker (quit 1980) Barrett's Esophagus Chronic atrophy left testicle Restless leg syndrome Hyperglycemia Elevated PSA squemas cell skin cancer  Review of Systems  The patient denies fever.         denies tremor  Physical Exam  Neck:  thyroid is perhaps slightly enlarged on the right, and normal on the left Additional Exam:  outside test results are reviewed:  tsh=0.058   Impression & Recommendations:  Problem # 1:  HYPERTHYROIDISM (ICD-242.90) Assessment New  Problem # 2:  anticoagulation interaction between tapazole and coumadin  Medications Added to Medication List This Visit: 1)  Methimazole 10 Mg Tabs (Methimazole) .Marland Kitchen.. 1 tab once daily  Other Orders: Est. Patient Level IV (16109) Cardiology Referral (Cardiology)  Patient Instructions: 1)  take methimazole 10 mg once daily 2)  Please schedule a follow-up appointment in 1 month. 3)  please see dr Kandis Cocking office in 4 days, for a coumadin check.   Prescriptions: METHIMAZOLE 10 MG TABS (METHIMAZOLE) 1 tab once daily  #30 x 2   Entered and Authorized by:   Minus Breeding MD   Signed by:   Minus Breeding MD on 01/06/2011   Method used:   Electronically to        [facility]  [address]  [phone]* (retail)       [address] [place], [place]  [address]       Ph: [phone] or [phone]       Fax: [phone]   RxID:   [id number]    Orders Added: 1)  Est. Patient Level IV [75643] 2)  Cardiology Referral [Cardiology]

## 2011-01-13 NOTE — Letter (Signed)
Summary: [facility]  [facility]   Imported By: Sherian Rein 01/04/2011 11:08:31  _____________________________________________________________________  External Attachment:    Type:   Image     Comment:   External Document

## 2011-01-18 ENCOUNTER — Encounter: Payer: Self-pay | Admitting: Endocrinology

## 2011-02-21 LAB — URINALYSIS, ROUTINE W REFLEX MICROSCOPIC
Hgb urine dipstick: NEGATIVE
Protein, ur: NEGATIVE mg/dL
Specific Gravity, Urine: 1.017 (ref 1.005–1.030)
Urobilinogen, UA: 1 mg/dL (ref 0.0–1.0)

## 2011-02-21 LAB — URINE MICROSCOPIC-ADD ON

## 2011-02-21 LAB — PROTIME-INR
INR: 2.4 — ABNORMAL HIGH (ref 0.00–1.49)
Prothrombin Time: 27.9 seconds — ABNORMAL HIGH (ref 11.6–15.2)

## 2011-02-21 LAB — URINE CULTURE

## 2011-03-16 ENCOUNTER — Telehealth: Payer: Self-pay | Admitting: *Deleted

## 2011-03-16 NOTE — Telephone Encounter (Signed)
Pt's wife Nicole Cella called, wanted to let MD know that pt has been hospitalized in [place], [place] with bacterial pneumonia.

## 2011-03-17 NOTE — Telephone Encounter (Signed)
Noted, thank you

## 2011-03-22 NOTE — Assessment & Plan Note (Signed)
[facility]   NAME:  [name] [name], [name] [name] NO.:  [id number]   MEDICAL RECORD NO.:  [id number]      DATE OF BIRTH:  1929/09/15   PHYSICIAN:  Maxwell Caul, M.D. VISIT DATE:  09/07/2007                                   OFFICE VISIT   PURPOSE OF TODAY'S VISIT:  [name] is seen in followup for his venous  stasis ulceration on his right lower extremity associated with edema and  venous stasis changes.  Last week we wrapped him with an Unna wrap and  used TCA.  We have already given him a prescription for bilateral 30 to  40 mm open-toe below the knee compression hose which he is wearing on  his left leg.   WOUND EXAM:  Temperature, he is afebrile.  Pulse is 78, respirations 16,  blood pressure 117/73.  On the right medial lower leg, there are several open areas compatible  with venous stasis.  All of these areas are close to 100%  epithelialized.  There is no evidence of infection.  The original trauma  he had on his medial aspect of his leg is also epithelializing nicely.  Nevertheless, I do not think all of these wounds are ready for usual  activity, and therefore, I have rewrapped him in the this week with  forecast that we will be able to transfer him to his graded pressure  stockings next week.  Incidentally, there is a raised area over the  right anterior tibia.  This area was a bit worrisome to me in terms of a  possible underlying skin cancer (Bowen's disease).  In the setting of  venous stasis, and the fact that the patient said that this area has  been present for a long time and unchanged, I am uncertain, but I would  observe this clinically over time.  He is already known to a  dermatologist.   IMPRESSION:  Venous stasis ulceration.  I have rewrapped him in an Unna  with TCA underneath.  He may take Unna off next week for a shower and  then I forecast we will be able to transfer him to his graded pressure  stockings and discharge  at that time.           ______________________________  Maxwell Caul, M.D.     MGR/MEDQ  D:  09/07/2007  T:  09/07/2007  Job:  [id number]

## 2011-03-22 NOTE — H&P (Signed)
NAME:  [name] [name], [name] [name] NO.:  [id number]   MEDICAL RECORD NO.:  [id number]          PATIENT TYPE:  EMS   LOCATION:  MAJO                         FACILITY:  [facility]   PHYSICIAN:  Therisa Doyne, MD    DATE OF BIRTH:  1929-10-13   DATE OF ADMISSION:  09/14/2008  DATE OF DISCHARGE:                              HISTORY & PHYSICAL   PRIMARY CARE Leray Garverick:  Sean A. Everardo All, MD   CARDIOTHORACIC SURGEON:  Dr. Hazle Quant at [facility].   ASSOCIATED PHYSICIAN:  Kerin Perna, M.D.   CHIEF COMPLAINT:  Left lower extremity pain and erythema.   HISTORY OF PRESENT ILLNESS:  A 77 year old white male with past medical  history significant for coronary artery disease, status post recent  cardiothoracic surgery in August 2009.  During this procedure he  underwent coronary artery bypass grafting, as well as St. Jude mitral  valve replacement surgery and associated MAZE procedure.  He presents to  the emergency department today because of acute pain and erythema in his  left lower extremity.  It should be noted that the patient has chronic  venous stasis changes, and recently had a fall approximately 2 weeks --  where he tripped over a cart.  He had several skin tears on both his  right and left legs.   This morning he awoke with severe tenderness and erythema over his left  knee, which extended down his left calf.  Because of these symptoms he  went to the urgent care clinic.  They recommended that he come to the  emergency department for intravenous antibiotics.   The patient denies any other symptoms.  He denies any fevers, chills or  malaise.  He denies any heart failure symptoms, denying dyspnea on  exertion.  No chest pain, orthopnea, PND or syncope   In the emergency department the patient received vancomycin 1 gram  intravenously.   REVIEW OF SYSTEMS:  All systems were reviewed and are negative, except  as those mentioned above in the history of present illness.   PAST  MEDICAL HISTORY:  1. Status post cardiothoracic surgery in August 2009, for which he      underwent CABG, St. Jude mitral valve replacement and MAZE      procedure.  2. Postoperative atrial fibrillation; on amiodarone and Coumadin.  3. Anemia.  4. Hypertension.  5. History of Barrett's esophagus.   SOCIAL HISTORY:  The patient is married and lives in [place].  He  denies tobacco, alcohol or illicit drugs.   FAMILY HISTORY:  Negative for cancer, stroke or heart attack.   ALLERGIES:  NO KNOWN DRUG ALLERGIES.   MEDICATIONS:  1. Aspirin 81 mg daily.  2. Coumadin 5 mg on Monday and 2.5 mg on the other days.  3. Potassium chloride 10 mEq on Tuesdays and Thursdays.  4. Lasix 20 mg on Tuesdays and Thursdays.  5. Lopressor 25 mg b.i.d.  6. Protonix 40 mg b.i.d.  7. Amiodarone 200 mg daily.  8. Digoxin 0.125 mg tablet, take 1/2 tablet daily.  9. Simvastatin 80 mg daily.   PHYSICAL EXAMINATION:  VITAL SIGNS:  Temperature 97.8, blood pressure  128/75, pulse 81, respirations 20, oxygen saturation 99% on room air.  GENERAL:  In no acute distress.  HEENT:  Normocephalic and atraumatic.  Pupils equal, round and reactive  to light and accommodation.  Extraocular movements are intact.  Oropharynx pink and moist.  NECK:  Supple, no lymphadenopathy nor jugular venous distention.  CARDIOVASCULAR:  Regular rate and rhythm, with a 2/6 holosystolic murmur  heard at the left lower sternal border.  CHEST:  Clear to auscultation bilaterally.  ABDOMEN:  Positive bowel sounds.  Soft, nontender and nondistended.  EXTREMITIES:  There is chronic stasis dermatitis and venous stasis  changes at the bilateral lower extremities.  His shin over his left  lower extremity has a healing skin tear, with granulation tissue  present.  His left knee is tender to palpation; it is warm and  erythematous.  There is also warmth extending down the lower calf.  NEUROLOGIC:  No focal deficits.  SKIN:  Abnormalities as  noted above.  BACK:  No CVA tenderness.   LABORATORY STUDIES:  White blood cell count 5.9, hemoglobin 9.8.  INR  2.1.   DIAGNOSTIC TESTING:  Left lower extremity x-ray showed no evidence of  osteomyelitis.   ASSESSMENT AND PLAN:  This is a 77 year old white male with a past  medical history significant for recent coronary artery bypass grafting,  mechanical mitral valve replacement and Maze procedure; complicated by  postoperative atrial fibrillation.  He presents to the emergency  department with evidence of left lower extremity cellulitis.   1. We will admit the patient to [facility] service.   1. Left lower extremity cellulitis.  He received vancomycin in the      emergency department, and by the patient's report he states that      his erythema is decreasing.  We will continue vancomycin for gram-      positive coverage.  Will check blood cultures x2.  Will obtain a      wound consult; I doubt that this is a DVT playing role in this, as      the patient has been chronically anticoagulated with Coumadin since      his surgery.   1. Status post St. Jude mitral valve mechanical replacement.  The      patient has no heart failure symptoms at this time, however he does      have evidence of a systolic murmur.  Because of this, we will check      an echocardiogram to ensure that the valve is functioning properly.      Continue Coumadin therapy, with 5 mg on Mondays and 2.5 mg on other      days.  Check daily INRs.   1. Postoperative atrial fibrillation:  Currently he appears to be in      sinus rhythm.  We will also check an EKG, monitor the patient on      telemetry.  Will continue amiodarone 200 mg daily; continue Digoxin      0.125 mg half a tablet daily; and Lopressor 20 mg b.i.d.   1. Coronary artery disease:  Continue aspirin, simvastatin and      Lopressor.   1. Fluids, Electrolytes, Nutrition:  Nitrites are stable.  Regular      diet.   1. DVT Prophylaxis:   The patient is chronically being anticoagulated.      Therisa Doyne, MD  Electronically Signed     SJT/MEDQ  D:  09/14/2008  T:  09/15/2008  Job:  [id number]

## 2011-03-22 NOTE — Assessment & Plan Note (Signed)
[facility]   NAME:  [name] [name], [name] [name]                ACCOUNT NO.:  [id number]   MEDICAL RECORD NO.:  [id number]      DATE OF BIRTH:  October 03, 1929   PHYSICIAN:  [name] A. Tanda Rockers, M.D. VISIT DATE:  07/19/2007                                   OFFICE VISIT   SUBJECTIVE:  [name] [name] is a 77 year old man who we are re-evaluating and  following for a stasis ulcer.  In the interim he has been treated with a  compression wrap.  There has been no pain.  There has been moderate  drainage, no malodor, and no fever.   OBJECTIVE:  His blood pressure is 143/87, respirations 16, pulse rate  70, temperature 97.8.  Inspection of the lower extremity shows that  there is 3+ edema on the left.  The right lower extremity has been  adequately wrapped with control of edema as manifested by lineal  wrinkles.  The wound, itself, has areas of necrosis.  Under EMLA block,  an excisional debridement was performed with removal of skin,  subcutaneous tissue, and chronically inflamed granulation tissue.  Hemorrhage was controlled with direct pressure.  Thereafter, and Unna  boot was reapplied.  The pedal pulse remains palpable.  There is no  evidence of ischemia or ascending infection.   ASSESSMENT:  Clinical improvement of stasis ulcer with adequate  debridement.   PLAN:  We are transferring the patient to the nurse only clinic for the  next 3 weeks for weekly visits, and the physician will reevaluate him in  1 month.  We have given the patient an opportunity to ask questions.  He  seems to understand the treatment and plan.  He expresses gratitude for  having been seen in the clinic; and indicates that he will be compliant      Harold A. Tanda Rockers, M.D.  Electronically Signed     HAN/MEDQ  D:  07/19/2007  T:  07/19/2007  Job:  [id number]

## 2011-03-22 NOTE — Assessment & Plan Note (Signed)
[facility]   NAME:  [name] [name], [name] [name]                ACCOUNT NO.:  [id number]   MEDICAL RECORD NO.:  [id number]      DATE OF BIRTH:  1929/03/27   PHYSICIAN:  [name] A. Tanda Rockers, M.D. VISIT DATE:  09/17/2007                                   OFFICE VISIT   SUBJECTIVE:  Mr. [name] [name] is a 77 year old man who we have followed for  stasis ulceration involving his right lower extremity.  We have treated  him with compression wrap therapy and topical agent.  He returns for  followup.  There has been no excessive drainage, malodor pain or fever.  He removed his wrap prior to his office visit.   OBJECTIVE:  VITAL SIGNS:  Blood pressure is 132/79, respirations 18,  pulse rate 80, temperature 97.5.  EXTREMITIES:  Inspection of the right lower extremity shows that wound  #1 has a firm eschar with contraction of the wound.  There is no  drainage.  There is no exudate.  There is no malodor.  There is no  evidence of infection or ischemia.   ASSESSMENT:  Resolved stasis.   PLAN:  We are placing the patient in bilateral 30-40 mm, below-the-knee  compression hose.  We have instructed him to wear these stockings upon  awakening and to leave them in place until he retires at night.  He is  not to sleep in the stockings.  He may notice some scant drainage  associated with mechanical separation of the eschar.  If this should  occur, he is to use a cotton gauze over thin epithelium, but he is to  continue to wear the stocking.  If he has any concerns, he is to call  the clinic for an interim appointment.  We are discharging the patient  from the clinic to keep his followup medical appointments with Dr. Romero Belling.   We have given the patient an opportunity to ask questions.  He seems to  understand and expresses gratitude for having been seen in the clinic.      Harold A. Tanda Rockers, M.D.  Electronically Signed     HAN/MEDQ  D:  09/17/2007  T:  09/18/2007  Job:  [id number]   cc:    Gregary Signs A. Everardo All, MD

## 2011-03-22 NOTE — Assessment & Plan Note (Signed)
[facility]                         GASTROENTEROLOGY OFFICE NOTE   NAME:BAUERAyren, [name] [name]                       MRN:          [id number]  DATE:08/23/2007                            DOB:          [birth year]    CHIEF COMPLAINT:  Follow up of reflux and other GI problems.  Prescription refill.   PROBLEMS:  1. Barrett's esophagus, no dysplasia on two consecutive EGDs.  2. Reflux esophagitis.  3. Hiatal hernia.  4. Gastritis.  5. Diverticulosis and external hemorrhoids on colonoscopy on November 15, 2006, done for surveillance of adenomatous polyps.  6. Chronic Coumadin therapy for atrial fibrillation, followed by Dr.      Alanda Amass.  7. Hypertension.  8. Dyslipidemia.  9. Mitral valve prolapse with some mitral regurgitation, aortic      sclerosis.  10.Prostate cancer, status post radiation.  11.Prior tonsillectomy.  12.Stasis ulcer of the right lower extremity, treated at the wound      care center this year.  13.Former smoker.  14.Varicose veins, lower extremities.  15.Restless legs syndrome.  16.Hyperglycemia, question pre-diabetes.  17.Moderate pulmonary hypertension.  18.Scoliosis.   MEDICATIONS:  1. Lisinopril 5 mg daily.  2. Protonix 40 mg b.i.d.  3. Simvastatin 80 mg daily.  4. Coumadin as directed.  5. Aspirin 81 mg daily.  6. Metoprolol 50 mg daily.  7. Multivitamin daily.   No known drug allergies.   He is doing well without any reflux.  I spent 15 minutes of time  reviewing his colonoscopy history, his Barrett's esophagus and the  rationale for b.i.d. Protonix therapy.  There is no dysphagia reported.  We discussed that he could have an EGD off of Coumadin with biopsies in  the future.  I think his recall for his EGD should be 2010, not 2009,  i.e., three years after two consecutive biopsies showing no dysplasia.  Colonoscopy will be considered at five years, depending on what his  medical comorbidities.   Weight 192  pounds.  Pulse 72, blood pressure 110/68.   ASSESSMENT:  Per problem list above.  He is stable.  We will continue  his current medications, get his Protonix refilled.  As long as there is  no dysphagia or symptoms, and he is continuing regular primary care  followup, we can refill his Protonix for two years without an office  visit, I believe.     Iva Boop, MD,FACG  Electronically Signed    CEG/MedQ  DD: 08/23/2007  DT: 08/23/2007  Job #: [id number]   cc:   Gerlene Burdock A. Alanda Amass, M.D.
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
## 2011-03-22 NOTE — Consult Note (Signed)
NEW PATIENT CONSULTATION   [name] [name], [name] [name]  DOB:  1929/09/28                                        August 01, 2008  CHART #:  [id number]   REASON FOR CONSULTATION:  Swelling in the right groin following CABG -  mitral valve replacement and Maze procedure by Dr. Janey Genta at [facility] in August 2009.   PRESENT ILLNESS:  [name] [name] is a very nice 77 year old gentleman who  underwent mitral valve replacement with a St. Jude porcine valve,  cryoMaze procedure, and saphenous vein graft to the LAD by Dr. Janey Genta  on June 13, 2008.  He had a perioperative left body CVA and had  bradycardia postop and had a transvenous dual-chamber pacemaker placed.  He is a patient of Dr. Alanda Amass and has been noted by Dr. Alanda Amass to  have some swelling in the right thigh where the saphenous vein was  harvested with an open technique.  He has been recently placed on  Coumadin after he had problems with nosebleeds.  He has undergone  rehabilitation and has recovered well from his left body stroke.  He  denies fever or drainage from the surgical incisions.   MEDICATIONS:  Lopressor, Lanoxin, amiodarone, simvastatin, Protonix,  Lasix, potassium, Prilosec, iron, Percocet p.r.n. pain, and Ambien  p.r.n.   PHYSICAL EXAMINATION:  VITAL SIGNS:  Blood pressure 120/80, pulse 86,  respirations 18, saturation 95%, and temperature 97.  GENERAL:  He is alert and oriented.  LUNGS:  Breath sounds are clear.  Sternum is stable and well healed.  He  has kyphosis of his chest.  CARDIAC:  Rhythm is regular.  He has a grade 2/6 holosystolic murmur.  EXTREMITIES:  He has chronic venous stasis of both legs in the pretibial  region to the ankles and some ankle edema.  He has 2 small bridged  surgical incisions of the right thigh where the vein was harvested.  There were some thickened tissue underneath the incisions of either  hematoma or collagenous scar formation.  There is no erythema  or  fluctuance and there is no evidence of fluid pocket.   LABORATORY DATA:  An ultrasound of the leg performed in Dr. Kandis Cocking  office showed a hematoma.   IMPRESSION AND RECOMMENDATIONS:  It does not appear the patient has a  lymphocele or fluid-filled structure in the thigh, but rather an  organizing hematoma with scar.  However, I would recommend elevation,  heat therapy, and observation.  The patient returned to see me in 1 week  for a wound check with a chest x-ray.  I would not start antibiotics at  this point.   Kerin Perna, M.D.  Electronically Signed   PV/MEDQ  D:  08/01/2008  T:  08/02/2008  Job:  [id number]   cc:   Gerlene Burdock A. Alanda Amass, M.D.

## 2011-03-22 NOTE — Assessment & Plan Note (Signed)
[facility]   NAME:  REEVES, MUSICK                ACCOUNT NO.:  [id number]   MEDICAL RECORD NO.:  [id number]      DATE OF BIRTH:  1929/05/13   PHYSICIAN:  [name] A. Tanda Rockers, M.D. VISIT DATE:  07/26/2007                                   OFFICE VISIT   SUBJECTIVE:  Mr. Gilkeson is a 77 year old man whom we are treating for  stasis ulcer involving his right lower extremity.  He has been managed  with an Radio broadcast assistant.  There has been no interim excessive drainage,  malodor, pain, or fever.   OBJECTIVE:  Blood pressure is 129/87, respirations 16, pulse rate 88,  temperature is 97.7.  Inspection of the right lower extremity shows that the edema is well  controlled with an Unna wrap.  The ulcer shows a 100% granulated base  without evidence of infection.  There is early epithelialization at the  periphery.  The pedal pulse remains faintly palpable.  There is no  evidence of ischemia or ascending infection.   ASSESSMENT:  Clinical response to compression.   PLAN:  Resume the Unna wrap and reevaluate the patient per nurse for 4  visits with monthly followup per M.D.      Harold A. Tanda Rockers, M.D.  Electronically Signed     HAN/MEDQ  D:  07/26/2007  T:  07/26/2007  Job:  [id number]

## 2011-03-22 NOTE — Assessment & Plan Note (Signed)
[facility]   NAME:  SENDER, RUEB                ACCOUNT NO.:  [id number]   MEDICAL RECORD NO.:  [id number]      DATE OF BIRTH:  [birth year]   PHYSICIAN:  [name] A. Tanda Rockers, M.D. VISIT DATE:  08/15/2007                                   OFFICE VISIT   SUBJECTIVE:  Mr. Bunner is a 77 year old man who we have followed for a  right medial stasis ulcer.  We have treated him exclusively with  compression wraps, he has shown continued decrease in the area.  In the  interim there has been no excessive drainage, malodor, pain or fever.  He is tolerating the wrap well.   OBJECTIVE:  Blood pressure is 123/71, respirations 16, pulse rate 77,  temperature 98.5.  Inspection of the lower extremity shows that there  are persistent changes of stasis in both lower extremities on the right.  The ulceration shows evidence of area reduction.  There is no evidence  of active infection, cellulitis or abscess.  The pedal pulses are  indeterminate but both lower extremities are equally warm and there is  no evidence of petechiae or acute ischemia.   ASSESSMENT:  Clinical response to compression therapy.   PLAN:  We will resume the Unna wrap.  We will reevaluate the patient in  1 week.      Harold A. Tanda Rockers, M.D.  Electronically Signed     HAN/MEDQ  D:  08/15/2007  T:  08/16/2007  Job:  [id number]
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
## 2011-03-22 NOTE — Assessment & Plan Note (Signed)
OFFICE VISIT   [name] [name], [name] [name]  DOB:  August 23, 1929                                        October 14, 2008  CHART #:  [id number]   HISTORY:  The patient seen on today's date in followup.  He was seen  last August 22, 2008, by Dr. Donata Clay where he was evaluated for a  hematoma of the right thigh, status post saphenous vein graft for his  combined CABG and mitral valve replacement done by Dr. Janey Genta in  August 2009.  At that time, he had a lymphocele/seroma aspirated of  approximately 20 mL of clear fluid in the distal aspect of the incision.  Since that time, he reports that he is having minimal discomfort from  this.  Primarily, he gets some discomfort following the use of his  recumbent bike in cardiac rehabilitation.  He describes this is only a  mild discomfort.  He denies fevers, chills, or other constitutional  symptoms.  He denies any drainage or erythema.   PHYSICAL EXAMINATION:  VITAL SIGNS:  Blood pressure is 108/84, pulse 84,  respirations 18, and oxygen saturation is 94% on room air.  PULMONARY:  Coarse breath sounds throughout.  CARDIAC:  A 2/6 systolic murmur.  EXTREMITIES:  The right groin incision is expected.  There is firmness  to the underlying tissue.  No erythema.  No drainage.  No flocculence.  No evidence of seroma.   ASSESSMENT:  The patient is making steady progress in his recovery  overall.  At this point, the wound is quite stable and will require some  time for remodeling of the dense scar tissue.  There is no current  evidence of infection.  We will see him again on a p.r.n. basis.   [name] [name], P.A.-C.   [name] [name]  D:  10/14/2008  T:  10/14/2008  Job:  [id number]   cc:   [name] Signs A. Everardo All, MD  [name] Register, MD  [name] [name], M.D.  [name] [name], M.D.

## 2011-03-22 NOTE — Assessment & Plan Note (Signed)
[facility]   NAME:  [name] [name], [name] [name]                ACCOUNT NO.:  [id number]   MEDICAL RECORD NO.:  [id number]      DATE OF BIRTH:  [birth year]   PHYSICIAN:  [name] A. Tanda Rockers, M.D. VISIT DATE:  08/23/2007                                   OFFICE VISIT   SUBJECTIVE:  [name] [name] is a 77 year old man who we are following for a  posttraumatic stasis ulcer involving his right medial lower extremity.  In the interim, he has worn an [employer]  He continues to be  ambulatory.  There has been no pain or excessive drainage.   OBJECTIVE:  VITAL SIGNS:  Blood pressure is 136/80, respirations 18,  pulse rate 77, temperature is 97.1.  EXTREMITIES:  Inspection of the right medial leg shows that there is in  fact decrease in area and volume of the previous ulcer.  There is  minimum erythema.  There is no evidence of active infection.  There is  no evidence of ischemia.   ASSESSMENT:  Clinical improvement.   PLAN:  We will resume compression therapy and reevaluate the patient in  1 week.      Harold A. Tanda Rockers, M.D.  Electronically Signed     HAN/MEDQ  D:  08/23/2007  T:  08/24/2007  Job:  [id number]

## 2011-03-22 NOTE — Assessment & Plan Note (Signed)
[facility]   NAME:  [name] [name], [name] [name] NO.:  [id number]   MEDICAL RECORD NO.:  [id number]      DATE OF BIRTH:  17-Nov-1928   PHYSICIAN:  Barry Dienes. Eloise Harman, M.D. VISIT DATE:  10/14/2008                                   OFFICE VISIT   SUBJECTIVE:  The patient is a 77 year old Caucasian man who has been  followed for traumatically-induced stasis ulcers on both legs.  He has  been treated with Silverlon covered by a Profore wrap changed once  weekly.  At last visit, his left-sided traumatic ulcer had resolved and  he was wearing 20-30 compression hose on that side.  He has not had  significant pain in either leg and has been wearing the compression hose  on the left side faithfully.   OBJECTIVE:  VITAL SIGNS:  Blood pressure 145/91, pulse 85, respirations  18, and temperature 97.6.   The condition of the wound is as follows:  Wound #2:  It is located on the proximal anterior right leg and has  resolved entirely.  Both legs continue to show dry skin without loss of  integrity of this underlying epidermis.  He has hyperpigmentation of  both legs consistent with chronic venous insufficiency.   ASSESSMENT:  Healed right anterior leg ulcer secondary to trauma plus  chronic venous insufficiency.  Continued chronic venous insufficiency of  both legs with dry skin.   PLAN:  He is discharged from the wound center today.  He was advised to  continue wearing his 20 - 30 knee-high compression hose every a.m.  In  addition, he was advised to apply Bag Balm, udder cream to both legs  nightly and covered with stockings.  If he has significant breakdown of  the skin in the future, he is advised to call for a reevaluation.           ______________________________  Barry Dienes Eloise Harman, M.D.     DGP/MEDQ  D:  10/14/2008  T:  10/14/2008  Job:  [id number]   cc:   Gregary Signs A. Everardo All, MD

## 2011-03-22 NOTE — Assessment & Plan Note (Signed)
[facility]   NAME:  [name] [name], [name] [name]                ACCOUNT NO.:  [id number]   MEDICAL RECORD NO.:  [id number]      DATE OF BIRTH:  Feb 17, 1929   PHYSICIAN:  Jonelle Sports. Sevier, M.D.  VISIT DATE:  10/01/2008                                   OFFICE VISIT   HISTORY:  This 77 year old white male is followed for stasis ulcerations  traumatically induced on the anterior aspects of both pretibial areas,  more proximal on the left than on the right.  These have been relatively  minor and the treated fairly conservatively simply with Silverlon pads  and Profore wraps.   In the week since his last visit, he reports no new problems.  No  awareness of swelling, no pain, no drainage or odor, and no fever or  systemic symptoms.   PHYSICAL EXAMINATION:  Blood pressure 144/84, pulse 80, respirations 20,  and temperature 97.7.  The lesion on the right anterior lower extremity  measures 0.7 x 0.5 x 0.1 cm and then on the left 1.2 x 1.2 x 0.2 cm.  Both have good clean bases and granular appearance and appear to be  progressing toward healing.   IMPRESSION:  Satisfactory course, traumatic venous ulcerations,  bilateral lower extremities.   DISPOSITION:  The wounds require no specific attention today that will  be redressed with Silverlon pads and bilateral Profore dressings with  the anticipation that they will heal within the next week.   Followup visit is scheduled for 1 week.           ______________________________  Jonelle Sports. Cheryll Cockayne, M.D.     RES/MEDQ  D:  10/01/2008  T:  10/01/2008  Job:  [id number]

## 2011-03-22 NOTE — Discharge Summary (Signed)
NAME:  [name] [name], [name] [name] NO.:  [id number]   MEDICAL RECORD NO.:  [id number]          PATIENT TYPE:  INP   LOCATION:  5524                         FACILITY:  [facility]   PHYSICIAN:  [name] [name], MDDATE OF BIRTH:  1929/07/17   DATE OF ADMISSION:  09/14/2008  DATE OF DISCHARGE:  09/16/2008                               DISCHARGE SUMMARY   PRIMARY CARE PHYSICIAN:  [name] A. Everardo All, MD   CARDIOTHORACIC SURGEON:  Dr. Lucretia [name] at [facility].   ASSOCIATE PHYSICIAN:  [name] Perna, MD   DISCHARGE DIAGNOSES:  Left lower extremity cellulitis in setting of  chronic venous stasis ulcers.   HISTORY OF PRESENT ILLNESS:  [name] [name] is a 77 year old white male with  past medical history of coronary artery disease status post recent  coronary artery bypass with St. Jude mitral valve replacement in August  2009 with associated maze procedure.  The patient presented to the  emergency room on day of admission with onset of acute pain and erythema  to the left lower extremity distal to knee.  The patient did report  tripping and falling approximately 2 weeks prior to onset of these  symptoms experiencing abrasions to left shin.  The patient denied any  fever, chills or malaise, denied any chest pain, orthopnea or syncope.  Upon evaluation in the emergency room, the patient found to have chronic  stasis dermatitis and venous stasis changes bilaterally to lower  extremities.  However, shin on his left lower extremity had healing skin  tear with granulation tissue, erythema noted to patellar region and  distal to anterior tib-fib area extending to lower calf.  The patient  was admitted for further evaluation and treatment of cellulitis.   PAST MEDICAL HISTORY:  1. Status post CABG St. Jude mitral valve replacement and maze      procedure August 2009 at [facility].  2. Postop AFib.  3. Anemia.  4. Hypertension.  5. History of Barrett  esophagus.   COURSE OF HOSPITALIZATION:  1. Left lower extremity cellulitis.  The patient started on empiric      vancomycin at the time of admission with slow improvement to left      lower extremity cellulitis despite therapeutic INR as the patient      is on chronic Coumadin.  Venous Dopplers were obtained which were      negative for DVT.  At this time as the patient has shown clinical      improvement and is anxious for discharge, the patient will be      discharged home on oral doxycycline as well as oral Avelox for a      total of 10 days treatment and close outpatient followup with      primary care physician.  Also of note, prior to this      hospitalization, the patient had been scheduled to see Wound Clinic      in regards to venous stasis ulcers by primary care physician.  The      patient is scheduled for this appointment on September 24, 2008, is      instructed to keep this appointment.  The patient also underwent      evaluation by wound care therapy during this hospitalization who      recommended Vaseline gauze to scabbed areas on lower extremities as      well as Mepilex to protect knees from friction and shear.  The      patient and wife will be instructed on wound care prior to      disposition.   MEDICATIONS AT THE TIME OF DISCHARGE:  1. Avelox 400 mg p.o. daily until gone.  2. Doxycycline 100 mg p.o. daily until gone.  3. Coumadin 5 mg on Monday.  4. Coumadin 2.5 mg Tuesday through Sunday.  5. Potassium chloride 10 mEq on Tuesday and Thursday.  6. Lasix 20 mg on Tuesday and Thursday.  7. Metoprolol 25 mg p.o. b.i.d.  8. Protonix 40 mg p.o. daily.  9. Amiodarone 1200 mg p.o. daily.  10.Digoxin 0.125 mg half tablet p.o. daily.  11.Simvastatin 80 mg p.o. daily.  12.Aspirin 81 mg p.o. daily.   PERTINENT LABORATORY WORK AT TIME OF DISCHARGE:  White cell count 5.9,  platelet count 314, hemoglobin 9.8, hematocrit 29.9, sodium 143,  potassium 4.0, BUN 19,  creatinine 1.1, blood cultures x2 obtained on  September 14, 2008 are negative for any growth.  INR on September 16, 2008,  2.1.   DISPOSITION:  The patient felt medically stable for discharge home at  this time.  The patient instructed to follow up with his primary care  physician Dr. Romero [name] on Thursday September 18, 2008 at 3 p.m. at  which time reassessment of the patient's cellulitis and any further  antibiotic treatment can be determined.  The patient is instructed to  call the office or return to the ER for any fever or increased erythema  or pain to the left lower extremity.      Cordelia Pen, NP      Raenette Rover. Felicity Coyer, MD  Electronically Signed    LE/MEDQ  D:  09/16/2008  T:  09/17/2008  Job:  [id number]   cc:   [name] Signs A. Everardo All, MD

## 2011-03-22 NOTE — Assessment & Plan Note (Signed)
OFFICE VISIT   [name] [name], [name] [name]  DOB:  1929-02-15                                        August 08, 2008  CHART #:  [id number]   CURRENT PROBLEMS:  1. Hematoma and seroma of right thigh status post saphenous vein      harvest for combined CABG and mitral valve replacement and maze      procedure by Dr. Janey Genta in August 2009.  2. Postoperative anemia.  3. Postoperative atrial fibrillation on amiodarone.   HISTORY OF PRESENT ILLNESS:  The patient is a 77 year old gentleman who  is recovering from a complex operation and prolonged postoperative  course in August.  He is currently walking and driving and is being  followed by Dr. Alanda Amass for Cardiology.  I was asked to see him for  evaluation treatment of his surgical incisions has he had a surgery out  town.  Today, he returns for a wound check of a right thigh hematoma  where vein was harvested after the mammary artery was found to be  inadequate for uses a conduit.  He denies fever.  He denies symptoms of  CHF.  The sternal incision has been healing well and has cause no  problem.   MEDICATIONS:  He remains on his multiple medications including aspirin,  Lopressor, Lanoxin, Protonix, Zocor, amiodarone, Lasix, and potassium.   PHYSICAL EXAMINATION:  Blood pressure 113/75, pulse 72 and regular,  respirations 18, saturation 98%.  He looks very well and stronger this  week.  Breath sounds are clear.  Cardiac rhythm is regular and he has a  soft systolic murmur grade 1/6.  Examination the right thigh reveals  some dense collagen - scar tissue in the right upper thigh.  The right  lower thigh has a fluctuant area consistent with a lymphoseal or seroma.  Under sterile prep, a needle aspiration this air returned 30 mL of clear  fluid.  A slight pressure wrap dressing was applied to the high which  will be alone placed for the next 12 hours.   IMAGING:  PA and lateral chest x-ray today shows fairly clear  lung  fields.  No significant pleural effusions and the sternal wires are all  intact.   IMPRESSION:  The patient is gradually recovering. .  I would not start  Coumadin in this patient due to his high risk for fall and the  bioprosthetic mitral valve.  I did provide him with a prescription for  Niferex to help with his postoperative anemia.  I plan on following his  leg wound and will see him back in 2 weeks to make sure the seroma does  not recur.  There is no sign of infection and antibiotics were not  indicated at this time.    Kerin Perna, M.D.  Electronically Signed   PV/MEDQ  D:  08/08/2008  T:  08/08/2008  Job:  [id number]   cc:   Gerlene Burdock A. Alanda Amass, M.D.

## 2011-03-22 NOTE — Assessment & Plan Note (Signed)
[facility]   NAME:  [name] [name], [name] [name]                ACCOUNT NO.:  [id number]   MEDICAL RECORD NO.:  [id number]      DATE OF BIRTH:  1929/03/27   PHYSICIAN:  [name] A. Tanda Rockers, M.D.      VISIT DATE:                                   OFFICE VISIT   SUBJECTIVE:  [name] [name] is a 77 year old man who we are following for  stasis ulceration.  We have treated him with Unna wraps.  He returns for  followup.  There has been no excessive drainage, malodor, pain or fever.   OBJECTIVE:  VITAL SIGNS:  Blood pressure is 135/49, respirations 16,  pulse rate 67, temperature 97.1.  EXTREMITIES:  Inspection of the right medial lower extremity shows that  the stasis wound has decreased in volume. The wound was measured,  photographed and catalogued.  There remains mild hyperemia and moist  exudative drainage.  This area was cultured without difficulty.  The  pedal pulse remains palpable.  The edema is 1 to 2+.   ASSESSMENT:  Clinical improvement.   PLAN:  We will continue the patient in the nurse's clinic weekly for 3  weeks and thereafter, we will have the patient evaluated by the  physician monthly.  In the interim, we will also initiate doxycycline  100 mg p.o. b.i.d.      Harold A. Tanda Rockers, M.D.  Electronically Signed     HAN/MEDQ  D:  08/02/2007  T:  08/02/2007  Job:  [id number]

## 2011-03-22 NOTE — Assessment & Plan Note (Signed)
OFFICE VISIT   CORDON, GASSETT  DOB:  16-Sep-1929                                        August 22, 2008  CHART #:  [id number]   CURRENT PROBLEMS:  1. Hematoma right thigh status post saphenous vein harvest for      combined CABG and mitral valve replacement and maze procedure by      Dr. Janey Genta in August 2009 at [facility].   HISTORY OF PRESENT ILLNESS:  Mr. Michiels returns for a wound check.  He  was seen 2 weeks ago for his right thigh.  He had a lymphocele-seroma  aspirated of approximately 20 mL of clear fluid at the distal aspect of  the incision.  He had a fairly firm well organized hematoma in the  proximal right groin.  He is now driving and walking and going to rehab.  He has had no fever.   On exam, his blood pressure is 120/80, pulse 80, respirations 18,  saturation 96%.  The right thigh and groin look better.  He still has  some indurated correction he still has some firm organized hematoma just  beneath the inguinal crease.  There is no fluctuance or evidence of a  cystic structure or lymphocele.  The distal aspect of the short incision  to harvest vein has less fluid and should resolve now without further  aspiration.  The distal leg is warm without significant edema.   PLAN:  Observational care of the right leg, but no further aspiration or  surgery is anticipated.  I will see him back for final check in  approximately 6 weeks.   Kerin Perna, M.D.  Electronically Signed   PV/MEDQ  D:  08/22/2008  T:  08/23/2008  Job:  [id number]
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
## 2011-03-22 NOTE — Assessment & Plan Note (Signed)
[facility]   NAME:  HITESH, FOUCHE                ACCOUNT NO.:  [id number]   MEDICAL RECORD NO.:  [id number]      DATE OF BIRTH:  31-May-1929   PHYSICIAN:  [name] A. Tanda Rockers, M.D. VISIT DATE:  08/31/2007                                   OFFICE VISIT   SUBJECTIVE:  Mr. Dilone is a 76 year old man who we are following for  stasis ulceration of his right lower extremity associated with edema.  He continues to be ambulatory.  There has been no pain.  No fever.  No  malodor.   OBJECTIVE:  VITAL SIGNS:  Blood pressure is 127/78, respirations 16,  pulse rate 66, temperature 98.5.  WOUNDS:  Inspection of his right lower extremity shows a persistence of  chronic stasis changes with hyperpigmentation and punctate ulcerations  which were measured, cataloged, and entered into the database.  Please  refer to that database.  The pedal pulse remains palpable.  Capillary  refill is normal.  There is no evidence of ascending infection.   IMPRESSION:  Continued improvement of stasis ulcerations with control of  edema.   PLAN:  We have given the patient a prescription for bilateral 30-40 mm  open toe below-the-knee compression hose.  He is to procure these  garments and to begin wearing them on his left lower extremity.  We  anticipate that his wound should be near completely healed during his  next visit.  We will see the patient in a week.      Harold A. Tanda Rockers, M.D.  Electronically Signed     HAN/MEDQ  D:  08/31/2007  T:  09/01/2007  Job:  [id number]

## 2011-03-22 NOTE — Assessment & Plan Note (Signed)
[facility]   NAME:  [name] [name], [name] [name]                ACCOUNT NO.:  [id number]   MEDICAL RECORD NO.:  [id number]      DATE OF BIRTH:  12/22/1928   PHYSICIAN:  Jonelle Sports. Sevier, M.D.  VISIT DATE:  10/08/2008                                   OFFICE VISIT   HISTORY:  This is a 77 year old white male who has been followed for  traumatically-induced stasis ulcers on both pretibial areas.  He has  been treated with standard treatment and compression and has shown  steady progress.   Since his last visit here a week ago, he has had no problematic symptoms  and his therapy has been well tolerated.   PHYSICAL EXAMINATION:  VITAL SIGNS:  Blood pressure 127/84, pulse 87,  temperature 97.6, respirations not recorded.  EXTREMITIES:  The lesion on the left anterior lower extremity pretibial  area is completely resolved.  On the right anterior pretibial area,  there is persistence of a small ulcer covered by a bit of crust.  Following removal of that crust, the lesion measures 0.7 x 1.2 x 0.1 cm  and has a nice clean base.   IMPRESSION:  Resolution of left lower extremity ulcer on persistence,  but with improvement of right lower extremity ulcer.   DISPOSITION:  1. The right lower extremity wound is selectively debrided of the      crust as indicated above.  2. It will then be treated with an application of Silverlon held in      place with Steri-Strips and that extremity returned to a Profore      wrap.  3. Followup visit will be here in 1 week with the anticipation that      this lesion will be completely healed at that time and it can be      released from further followup.           ______________________________  Jonelle Sports. Cheryll Cockayne, M.D.     RES/MEDQ  D:  10/08/2008  T:  10/08/2008  Job:  [id number]

## 2011-03-22 NOTE — Assessment & Plan Note (Signed)
[facility]   NAME:  [name], [name] NO.:  [id number]   MEDICAL RECORD NO.:  [id number]      DATE OF BIRTH:  1929/05/29   PHYSICIAN:  Darlin Priestly, MD   VISIT DATE:  02/29/2008                                   OFFICE VISIT   [name] [name] is a 77 year old male, patient of Dr. Susa Griffins, with  a history of hypertension, hyperlipidemia, noncritical CAD by remote  cath, history of Barrett's esophagus, history of mitral regurgitation,  and questionable aortic valve disease.  He recently underwent  transesophageal echocardiogram to evaluate his mitral regurgitation;  however, this was a poor quality study secondary to inability to obtain  adequate images.  The patient does have severe high kyphosis.  He is now  brought back for repeat attempt at TEE to reevaluate the mitral valve.   DESCRIPTION OF PROCEDURE:  After getting informed consent, the patient  was brought to the [facility]oscopy Suite in a fasting state.  He then  underwent successful and uncomplicated transesophageal echocardiogram.   This is a technically suboptimal study secondary to poor acoustic  window.   The left ventricle is not well visualized.   The aortic valve appears to be moderately calcified with mild aortic  regurgitation.  There appears to be probable mild-to-moderate aortic  stenosis with __________  aortic valve area of approximately 1.2 to 1.3  sq cm.   The mitral valve is not perfectly visualized; however, we are able to  assess the patient has probable moderate-to-severe mitral annular  calcification.  The anterior and posterior mitral leaflets appear at  least moderately thickened with moderate prolapse of the posterior  mitral valve leaflet.  There appears to be at least moderate mitral  regurg, which is an eccentric jet skirting across the atrial floor.  The  left atrium does appear to be dilated, though I am unable to completely  visualize his entire  left atrium.  Continuous-wave Doppler interrogation  of the mitral valve reveals a probable mild mitral stenosis with a  transmitral valve gradient of approximately 6 mmHg.   The tricuspid and pulmonic valves are not well visualized.   There appears to be moderate calcification of the descending thoracic  aorta with mild-to-moderate atherosclerosis present.   No __________ views obtained.   Conclusions:  Technically suboptimal transesophageal echocardiogram  secondary to poor acoustic window.  The patient does appear to have  moderately thickened mitral valve anterior and posterior leaflets with  moderate prolapse  of posterior mitral valve leaflet and at least moderate mitral regurg.  There also appears to be mild mitral stenosis.  In addition, the patient  has a moderately calcified aortic valve with probable mild aortic  stenosis and mild aortic insufficiency.  I cannot comment on left  ventricular function.      Darlin Priestly, MD  Electronically Signed     RHM/MEDQ  D:  02/29/2008  T:  03/01/2008  Job:  [id number]   cc:   Gerlene Burdock A. Alanda Amass, M.D.

## 2011-03-23 DIAGNOSIS — F411 Generalized anxiety disorder: Secondary | ICD-10-CM

## 2011-03-24 ENCOUNTER — Ambulatory Visit: Payer: Medicare Other | Admitting: Endocrinology

## 2011-03-24 ENCOUNTER — Encounter: Payer: Self-pay | Admitting: Endocrinology

## 2011-03-24 ENCOUNTER — Ambulatory Visit (INDEPENDENT_AMBULATORY_CARE_PROVIDER_SITE_OTHER): Payer: Medicare Other | Admitting: Endocrinology

## 2011-03-24 DIAGNOSIS — I509 Heart failure, unspecified: Secondary | ICD-10-CM

## 2011-03-24 DIAGNOSIS — E0590 Thyrotoxicosis, unspecified without thyrotoxic crisis or storm: Secondary | ICD-10-CM

## 2011-03-24 DIAGNOSIS — D509 Iron deficiency anemia, unspecified: Secondary | ICD-10-CM

## 2011-03-24 NOTE — Patient Instructions (Addendum)
blood tests are being ordered for you today.  please call [phone] to hear your test results.  You will be prompted to enter the 9-digit "MRN" number that appears at the top left of this page, followed by #.  Then you will hear the message. pending the test results, please continue these listed medications for now.  Cc dr Alanda Amass Please make a follow-up appointment in 1 month (update: i left message on phone-tree:  Same rx.

## 2011-03-24 NOTE — Progress Notes (Signed)
Subjective:    Patient ID: [name] [name], male    DOB: Nov 16, 1928, 77 y.o.   MRN: [id number]  HPI Pt was recently hospitalized for pneumonia in [place] (he had gone to celebrate his 82th birthday). He was rx'ed  With abx, and was d/c'ed 03/18/11.  pt states he feels better in general. He was noted to have "water around my heart," which was rx'ed with lasix.   He was given kcl also, due to being on lasix.   He ran out of tapazole 1-2 mos ago.   Past Medical History  Diagnosis Date  . GASTRIC POLYP 05/13/2009  . COLONIC POLYPS, ADENOMATOUS 01/14/2008  . HYPERLIPIDEMIA 06/01/2007  . ANEMIA, IRON DEFICIENCY 10/13/2008  . UNSPECIFIED ANEMIA 09/18/2008  . COAGULOPATHY, COUMADIN-INDUCED 01/14/2008  . ANXIETY 09/04/2008  . HYPERTENSION 06/01/2007  . CORONARY ARTERY DISEASE 06/01/2007  . ATRIAL FIBRILLATION, CHRONIC 01/14/2008  . HEMORRHOIDS, EXTERNAL 01/14/2008  . UNSPECIFIED VENOUS INSUFFICIENCY 09/18/2008  . PLEURAL EFFUSION, LEFT 10/13/2008  . GERD 06/01/2007  . BARRETTS ESOPHAGUS 03/20/2009  . HIATAL HERNIA 01/14/2008  . DIVERTICULOSIS, COLON 01/14/2008  . PROSTATE CANCER, HX OF 01/14/2008  . CATARACTS, BILATERAL, HX OF 01/14/2008  . CEREBROVASCULAR ACCIDENT, HX OF 08/18/2008  . PERSONAL HX COLONIC POLYPS 01/01/2010  . BASAL CELL CARCINOMA OF SKIN SITE UNSPECIFIED 09/28/2010  . HYPERTHYROIDISM 01/06/2011  . Hyperglycemia   . Elevated PSA   . Restless leg syndrome   . Mitral regurgitation     s/p MVR, bioprosthetic    Past Surgical History  Procedure Date  . Prostatectomy   . Tonsillectomy   . Cystectomy     Left leg  . Pacemaker placement     Adapata  . Coronary artery bypass graft   . Mitral valve replacement   . Maze procedure 08/09    ECU  . Cardiac catheterization 11/01/1990  . Skin cancer excision     removed from forehead and right leg    History   Social History  . Marital Status: Married    Spouse Name: N/A    Number of Children: N/A  . Years of Education: N/A   Occupational  History  . Retired    Social History Main Topics  . Smoking status: Former Games developer  . Smokeless tobacco: Not on file  . Alcohol Use: No  . Drug Use: No  . Sexually Active: Not on file   Other Topics Concern  . Not on file   Social History Narrative  . No narrative on file    Current Outpatient Prescriptions on File Prior to Visit  Medication Sig Dispense Refill  . amiodarone (PACERONE) 200 MG tablet Take 200 mg by mouth daily.        Marland Kitchen aspirin (BAYER LOW STRENGTH) 81 MG EC tablet Take 81 mg by mouth daily.        . Ferrous Sulfate (IRON) 325 (65 FE) MG TABS Take 1 tablet by mouth daily.        . methimazole (TAPAZOLE) 10 MG tablet Take 10 mg by mouth daily.        . metoprolol tartrate (LOPRESSOR) 25 MG tablet Take 25 mg by mouth 2 (two) times daily.        . Multiple Vitamins-Minerals (CENTRUM SILVER ULTRA MENS) TABS Take 1 tablet by mouth daily.        . pantoprazole (PROTONIX) 40 MG tablet Take 40 mg by mouth 2 (two) times daily.        . simvastatin (ZOCOR)  40 MG tablet Take 40 mg by mouth at bedtime.        Marland Kitchen warfarin (COUMADIN) 3 MG tablet Take 1 tablet by mouth as directed         No Known Allergies  Family History  Problem Relation Age of Onset  . Cancer Mother     Breast Cancer    BP 124/82  Pulse 88  Temp(Src) 97.9 F (36.6 C) (Oral)  Ht 5\' 9"  (1.753 m)  Wt 158 lb 3.2 oz (71.759 kg)  BMI 23.36 kg/m2  SpO2 98%    Review of Systems He lost weight due to his illness. No further fever.      Objective:   Physical Exam elderly, frail, no distress LUNGS:  Clear to auscultation.    Lab Results  Component Value Date   WBC 5.5 09/28/2010   HGB 12.9* 09/28/2010   HCT 37.8* 09/28/2010   PLT 169.0 09/28/2010   CHOL 109 01/01/2010   TRIG 29.0 01/01/2010   HDL 49.60 01/01/2010   ALT 18 01/01/2010   AST 22 01/01/2010   NA 143 01/01/2010   K 3.7 01/01/2010   CL 108 01/01/2010   CREATININE 1.1 01/01/2010   BUN 20 01/01/2010   CO2 28 01/01/2010   TSH 1.32  09/28/2010   PSA 0.00 Repeated and verified X2. ng/mL* 01/01/2010   INR 2.4* 11/18/2008      Assessment & Plan:  Pneumonia, clinically improved. Chf, clinically improved Hypokalemia, he needs to contnue rx for now Hyperthyroidism.  He needs to continue tapazole.
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
## 2011-03-28 ENCOUNTER — Telehealth: Payer: Self-pay

## 2011-03-28 NOTE — Telephone Encounter (Signed)
Message copied by Margaret Pyle on Mon Mar 28, 2011  2:28 PM ------      Message from: Romero Belling      Created: Sat Mar 26, 2011  3:32 PM       Sorry, i mean to dr Alanda Amass

## 2011-03-28 NOTE — Telephone Encounter (Signed)
Message copied by Margaret Pyle on Mon Mar 28, 2011  2:28 PM ------      Message from: Romero Belling      Created: Sat Mar 26, 2011  3:31 PM       Please fax note, and labs, if any, to pcp.  Thank you.

## 2011-03-28 NOTE — Telephone Encounter (Signed)
done

## 2011-03-29 ENCOUNTER — Other Ambulatory Visit: Payer: Self-pay | Admitting: Endocrinology

## 2011-04-06 ENCOUNTER — Ambulatory Visit (INDEPENDENT_AMBULATORY_CARE_PROVIDER_SITE_OTHER)
Admission: RE | Admit: 2011-04-06 | Discharge: 2011-04-06 | Disposition: A | Discharge status: Home / Self Care | Payer: Medicare Other | Source: Ambulatory Visit | Attending: Internal Medicine | Admitting: Internal Medicine

## 2011-04-06 ENCOUNTER — Encounter: Payer: Self-pay | Admitting: Internal Medicine

## 2011-04-06 ENCOUNTER — Ambulatory Visit (INDEPENDENT_AMBULATORY_CARE_PROVIDER_SITE_OTHER): Payer: Medicare Other | Admitting: Internal Medicine

## 2011-04-06 VITALS — BP 110/72 | HR 87 | Temp 97.8°F | Ht 70.0 in | Wt 166.2 lb

## 2011-04-06 DIAGNOSIS — I509 Heart failure, unspecified: Secondary | ICD-10-CM

## 2011-04-06 DIAGNOSIS — IMO0002 Reserved for concepts with insufficient information to code with codable children: Secondary | ICD-10-CM

## 2011-04-06 DIAGNOSIS — I10 Essential (primary) hypertension: Secondary | ICD-10-CM

## 2011-04-06 DIAGNOSIS — R509 Fever, unspecified: Secondary | ICD-10-CM

## 2011-04-06 MED ORDER — AZITHROMYCIN 250 MG PO TABS: ORAL_TABLET | ORAL | Status: AC

## 2011-04-06 NOTE — Progress Notes (Signed)
Subjective:    Patient ID: [name] [name], male    DOB: [birth year], 77 y.o.   MRN: [id number]  HPI  Pt of Dr Everardo All with hx afib, CVA, prostate CA, CAD, CHF, Left pleural effusion - here after recently visiting family in [place] early May 2012, unfortunately requiring hospn there may 5-10 for primarily PNA per pt, and reports also seen per Cardiology and felt pacemaker ok, and no other acute cardiac issue (no record available today).  Treated with antibx IV, then with more to go home (not sure of name), finished over a wk ago, and seemed to be back to baseline until a few days ago with onset URI symptoms of feeling warm, sinus congestion, ear fullness with feeling off balance, hoarseness and cough (nonprod).  Pt denies chest pain, increased sob or doe, wheezing, orthopnea, PND, increased LE swelling, palpitations, dizziness or syncope. Pt denies new neurological symptoms such as new headache, or facial or extremity weakness or numbness   Pt denies polydipsia, polyuria.  No recent fall, not sure how he developed abrasion to left leg Past Medical History  Diagnosis Date  . GASTRIC POLYP 05/13/2009  . COLONIC POLYPS, ADENOMATOUS 01/14/2008  . HYPERLIPIDEMIA 06/01/2007  . ANEMIA, IRON DEFICIENCY 10/13/2008  . UNSPECIFIED ANEMIA 09/18/2008  . COAGULOPATHY, COUMADIN-INDUCED 01/14/2008  . ANXIETY 09/04/2008  . HYPERTENSION 06/01/2007  . CORONARY ARTERY DISEASE 06/01/2007  . ATRIAL FIBRILLATION, CHRONIC 01/14/2008  . HEMORRHOIDS, EXTERNAL 01/14/2008  . UNSPECIFIED VENOUS INSUFFICIENCY 09/18/2008  . PLEURAL EFFUSION, LEFT 10/13/2008  . GERD 06/01/2007  . BARRETTS ESOPHAGUS 03/20/2009  . HIATAL HERNIA 01/14/2008  . DIVERTICULOSIS, COLON 01/14/2008  . PROSTATE CANCER, HX OF 01/14/2008  . CATARACTS, BILATERAL, HX OF 01/14/2008  . CEREBROVASCULAR ACCIDENT, HX OF 08/18/2008  . PERSONAL HX COLONIC POLYPS 01/01/2010  . BASAL CELL CARCINOMA OF SKIN SITE UNSPECIFIED 09/28/2010  . HYPERTHYROIDISM 01/06/2011  . Hyperglycemia   .  Elevated PSA   . Restless leg syndrome   . Mitral regurgitation     s/p MVR, bioprosthetic   Past Surgical History  Procedure Date  . Prostatectomy   . Tonsillectomy   . Cystectomy     Left leg  . Pacemaker placement     Adapata  . Coronary artery bypass graft   . Mitral valve replacement   . Maze procedure 08/09    ECU  . Cardiac catheterization 11/01/1990  . Skin cancer excision     removed from forehead and right leg    reports that he has quit smoking. He does not have any smokeless tobacco history on file. He reports that he does not drink alcohol or use illicit drugs. family history includes Cancer in his mother. No Known Allergies Current Outpatient Prescriptions on File Prior to Visit  Medication Sig Dispense Refill  . amiodarone (PACERONE) 200 MG tablet Take 200 mg by mouth daily.        Marland Kitchen aspirin (BAYER LOW STRENGTH) 81 MG EC tablet Take 81 mg by mouth daily.        . Ferrous Sulfate (IRON) 325 (65 FE) MG TABS Take 1 tablet by mouth daily.        . methimazole (TAPAZOLE) 10 MG tablet TAKE 1 TABLET EVERY DAY  30 tablet  5  . metoprolol tartrate (LOPRESSOR) 25 MG tablet Take 25 mg by mouth 2 (two) times daily.        . Multiple Vitamins-Minerals (CENTRUM SILVER ULTRA MENS) TABS Take 1 tablet by mouth daily.        Marland Kitchen  pantoprazole (PROTONIX) 40 MG tablet Take 40 mg by mouth 2 (two) times daily.        . simvastatin (ZOCOR) 40 MG tablet Take 40 mg by mouth at bedtime.        Marland Kitchen warfarin (COUMADIN) 3 MG tablet Take 1 tablet by mouth as directed        Review of Systems All otherwise neg per pt     Objective:   Physical Exam BP 110/72  Pulse 87  Temp(Src) 97.8 F (36.6 C) (Oral)  Ht 5\' 10"  (1.778 m)  Wt 166 lb 4 oz (75.411 kg)  BMI 23.85 kg/m2  SpO2 98% Physical Exam  VS noted, mild ill appearing Constitutional: Pt appears well-developed and well-nourished.  HENT: Head: Normocephalic.  Right Ear: External ear normal.  Left Ear: External ear normal.  Bilat tm's  mild erythema.  Sinus nontender.  Pharynx mild erythema Eyes: Conjunctivae and EOM are normal. Pupils are equal, round, and reactive to light.  Neck: Normal range of motion. Neck supple. neck with mid post SCM mass, firm/not hard, ? Mild tender No other neck LA or mass noted Cardiovascular: Normal rate and regular rhythm.   Pulmonary/Chest: Effort normal and breath sounds with few LLL crackles Abd:  Soft, NT, non-distended, + BS Neurological: Pt is alert. No cranial nerve deficit.  Skin: Skin is warm. No erythema. left leg abrasion below knee 2 cm area without red/swelling/tender Psychiatric: Pt behavior is normal. Thought content normal.         Assessment & Plan:

## 2011-04-06 NOTE — Assessment & Plan Note (Signed)
Exam c/w URI, left neck nodular mass and LLL rales - unclear what this represents whether acute or chronic given the recent hx of PNA in [place];  Will tx with zpack, check CXR, and ask pt to see Dr Everardo All in 1 wk

## 2011-04-06 NOTE — Patient Instructions (Signed)
Take all new medications as prescribed Continue all other medications as before Please go to XRAY in the Basement for the x-ray test Please call the phone number [phone] (the PhoneTree System) for results of testing in 2-3 days;  When calling, simply dial the number, and when prompted enter the MRN number above (the Medical Record Number) and the # key, then the message should start. Please return in 1 week to Dr Everardo All for re-check

## 2011-04-11 ENCOUNTER — Encounter: Payer: Self-pay | Admitting: Internal Medicine

## 2011-04-11 NOTE — Assessment & Plan Note (Signed)
Noted left knee area, no sign of cellulitis,  to f/u any worsening symptoms or concerns

## 2011-04-11 NOTE — Assessment & Plan Note (Signed)
stable overall by hx and exam, most recent data reviewed with pt, and pt to continue medical treatment as before  BP Readings from Last 3 Encounters:  04/06/11 110/72  03/24/11 124/82  01/06/11 110/62

## 2011-04-11 NOTE — Assessment & Plan Note (Signed)
stable overall by hx and exam, most recent data reviewed with pt, and pt to continue medical treatment as before  Lab Results  Component Value Date   WBC 5.5 09/28/2010   HGB 12.9* 09/28/2010   HCT 37.8* 09/28/2010   PLT 169.0 09/28/2010   CHOL 109 01/01/2010   TRIG 29.0 01/01/2010   HDL 49.60 01/01/2010   ALT 18 01/01/2010   AST 22 01/01/2010   NA 143 01/01/2010   K 3.7 01/01/2010   CL 108 01/01/2010   CREATININE 1.1 01/01/2010   BUN 20 01/01/2010   CO2 28 01/01/2010   TSH 1.32 09/28/2010   PSA 0.00 Repeated and verified X2. ng/mL* 01/01/2010   INR 2.4* 11/18/2008

## 2011-04-13 ENCOUNTER — Other Ambulatory Visit (INDEPENDENT_AMBULATORY_CARE_PROVIDER_SITE_OTHER): Payer: Medicare Other

## 2011-04-13 ENCOUNTER — Ambulatory Visit (INDEPENDENT_AMBULATORY_CARE_PROVIDER_SITE_OTHER): Payer: Medicare Other | Admitting: Endocrinology

## 2011-04-13 ENCOUNTER — Encounter: Payer: Self-pay | Admitting: Endocrinology

## 2011-04-13 DIAGNOSIS — I509 Heart failure, unspecified: Secondary | ICD-10-CM

## 2011-04-13 DIAGNOSIS — D509 Iron deficiency anemia, unspecified: Secondary | ICD-10-CM

## 2011-04-13 DIAGNOSIS — E0590 Thyrotoxicosis, unspecified without thyrotoxic crisis or storm: Secondary | ICD-10-CM

## 2011-04-13 LAB — CBC WITH DIFFERENTIAL/PLATELET
Basophils Absolute: 0 10*3/uL (ref 0.0–0.1)
Eosinophils Relative: 0.6 % (ref 0.0–5.0)
HCT: 33.7 % — ABNORMAL LOW (ref 39.0–52.0)
Hemoglobin: 11.5 g/dL — ABNORMAL LOW (ref 13.0–17.0)
Lymphocytes Relative: 17.8 % (ref 12.0–46.0)
Monocytes Relative: 11.6 % (ref 3.0–12.0)
Neutro Abs: 3.7 10*3/uL (ref 1.4–7.7)
RBC: 3.84 Mil/uL — ABNORMAL LOW (ref 4.22–5.81)
RDW: 14.9 % — ABNORMAL HIGH (ref 11.5–14.6)
WBC: 5.3 10*3/uL (ref 4.5–10.5)

## 2011-04-13 LAB — BASIC METABOLIC PANEL
BUN: 19 mg/dL (ref 6–23)
Chloride: 110 mEq/L (ref 96–112)
GFR: 98.84 mL/min (ref >60.00)
Potassium: 4.1 mEq/L (ref 3.5–5.1)
Sodium: 144 mEq/L (ref 135–145)

## 2011-04-13 LAB — IBC PANEL: Saturation Ratios: 15.5 % — ABNORMAL LOW (ref 20.0–50.0)

## 2011-04-13 LAB — BRAIN NATRIURETIC PEPTIDE: Pro B Natriuretic peptide (BNP): 420 pg/mL — ABNORMAL HIGH (ref 0.0–100.0)

## 2011-04-13 LAB — TSH: TSH: 0.02 u[IU]/mL — ABNORMAL LOW (ref 0.35–5.50)

## 2011-04-13 MED ORDER — FUROSEMIDE 20 MG PO TABS: 20.0000 mg | ORAL_TABLET | Freq: Every day | ORAL | Status: DC

## 2011-04-13 MED ORDER — METHIMAZOLE 10 MG PO TABS: 10.0000 mg | ORAL_TABLET | Freq: Two times a day (BID) | ORAL | Status: DC

## 2011-04-13 NOTE — Patient Instructions (Addendum)
blood tests are being ordered for you today.  please call [phone] to hear your test results.  You will be prompted to enter the 9-digit "MRN" number that appears at the top left of this page, followed by #.  Then you will hear the message. pending the test results, please continue these listed medications for now.  Cc dr Alanda Amass Please make a follow-up appointment in 1 month. (update: i left message on phone-tree:  Increase tapazole to 10 mg bid.  Increase fe to 1 tab bid).

## 2011-04-13 NOTE — Progress Notes (Signed)
Subjective:    Patient ID: [name] [name], male    DOB: [birth year], 77 y.o.   MRN: [id number]  HPI Pt says cough and hoarseness have improved since pt was seen here 1 week ago. He still reports doe, but that is better recently. Denies brbpr and hematuria Past Medical History  Diagnosis Date  . GASTRIC POLYP 05/13/2009  . COLONIC POLYPS, ADENOMATOUS 01/14/2008  . HYPERLIPIDEMIA 06/01/2007  . ANEMIA, IRON DEFICIENCY 10/13/2008  . UNSPECIFIED ANEMIA 09/18/2008  . COAGULOPATHY, COUMADIN-INDUCED 01/14/2008  . ANXIETY 09/04/2008  . HYPERTENSION 06/01/2007  . CORONARY ARTERY DISEASE 06/01/2007  . ATRIAL FIBRILLATION, CHRONIC 01/14/2008  . HEMORRHOIDS, EXTERNAL 01/14/2008  . UNSPECIFIED VENOUS INSUFFICIENCY 09/18/2008  . PLEURAL EFFUSION, LEFT 10/13/2008  . GERD 06/01/2007  . BARRETTS ESOPHAGUS 03/20/2009  . HIATAL HERNIA 01/14/2008  . DIVERTICULOSIS, COLON 01/14/2008  . PROSTATE CANCER, HX OF 01/14/2008  . CATARACTS, BILATERAL, HX OF 01/14/2008  . CEREBROVASCULAR ACCIDENT, HX OF 08/18/2008  . PERSONAL HX COLONIC POLYPS 01/01/2010  . BASAL CELL CARCINOMA OF SKIN SITE UNSPECIFIED 09/28/2010  . HYPERTHYROIDISM 01/06/2011  . Hyperglycemia   . Elevated PSA   . Restless leg syndrome   . Mitral regurgitation     s/p MVR, bioprosthetic    Past Surgical History  Procedure Date  . Prostatectomy   . Tonsillectomy   . Cystectomy     Left leg  . Pacemaker placement     Adapata  . Coronary artery bypass graft   . Mitral valve replacement   . Maze procedure 08/09    ECU  . Cardiac catheterization 11/01/1990  . Skin cancer excision     removed from forehead and right leg    History   Social History  . Marital Status: Married    Spouse Name: N/A    Number of Children: N/A  . Years of Education: N/A   Occupational History  . Retired    Social History Main Topics  . Smoking status: Former Smoker    Types: Pipe  . Smokeless tobacco: Not on file  . Alcohol Use: No  . Drug Use: No  . Sexually Active:  Not on file   Other Topics Concern  . Not on file   Social History Narrative  . No narrative on file    Current Outpatient Prescriptions on File Prior to Visit  Medication Sig Dispense Refill  . amiodarone (PACERONE) 200 MG tablet Take 200 mg by mouth daily.        Marland Kitchen aspirin (BAYER LOW STRENGTH) 81 MG EC tablet Take 81 mg by mouth daily.        . Ferrous Sulfate (IRON) 325 (65 FE) MG TABS Take 1 tablet by mouth daily.        . methimazole (TAPAZOLE) 10 MG tablet TAKE 1 TABLET EVERY DAY  30 tablet  5  . metoprolol tartrate (LOPRESSOR) 25 MG tablet Take 25 mg by mouth 2 (two) times daily.        . Multiple Vitamins-Minerals (CENTRUM SILVER ULTRA MENS) TABS Take 1 tablet by mouth daily.        . pantoprazole (PROTONIX) 40 MG tablet Take 40 mg by mouth 2 (two) times daily.        . simvastatin (ZOCOR) 40 MG tablet Take 40 mg by mouth at bedtime.        Marland Kitchen warfarin (COUMADIN) 3 MG tablet Take 1 tablet by mouth as directed         No Known Allergies  Family History  Problem Relation Age of Onset  . Cancer Mother     Breast Cancer    BP 124/80  Pulse 90  Temp(Src) 97.4 F (36.3 C) (Oral)  Ht 5\' 10"  (1.778 m)  Wt 167 lb 12.8 oz (76.114 kg)  BMI 24.08 kg/m2  SpO2 98%     Review of Systems Denies fever and chest pain    Objective:   Physical Exam elderly, frail, no distress Chest wall: kyphosis (chronic) LUNGS:  Clear to auscultation    04/06/11 cxr is noted. Lab Results  Component Value Date   IRON 41* 04/13/2011   Lab Results  Component Value Date   WBC 5.3 04/13/2011   HGB 11.5* 04/13/2011   HCT 33.7* 04/13/2011   MCV 87.7 04/13/2011   PLT 151.0 04/13/2011   Lab Results  Component Value Date   TSH 0.02* 04/13/2011  bnp is elevated  Assessment & Plan:  Chf, with mild exacerbation Hyperthyroidism, needs increased rx fe-deficiency anemia, needs increased rx

## 2011-05-05 LAB — PACEMAKER DEVICE OBSERVATION

## 2011-06-23 ENCOUNTER — Other Ambulatory Visit: Payer: Self-pay

## 2011-06-23 MED ORDER — METHIMAZOLE 10 MG PO TABS: 10.0000 mg | ORAL_TABLET | Freq: Two times a day (BID) | ORAL | Status: DC

## 2011-06-23 NOTE — Telephone Encounter (Signed)
Pt informed of Rx refill to mail order pharmacy

## 2011-07-18 ENCOUNTER — Other Ambulatory Visit (INDEPENDENT_AMBULATORY_CARE_PROVIDER_SITE_OTHER): Payer: Medicare Other

## 2011-07-18 ENCOUNTER — Ambulatory Visit (INDEPENDENT_AMBULATORY_CARE_PROVIDER_SITE_OTHER)
Admission: RE | Admit: 2011-07-18 | Discharge: 2011-07-18 | Disposition: A | Discharge status: Home / Self Care | Payer: Medicare Other | Source: Ambulatory Visit | Attending: Endocrinology | Admitting: Endocrinology

## 2011-07-18 ENCOUNTER — Ambulatory Visit (INDEPENDENT_AMBULATORY_CARE_PROVIDER_SITE_OTHER): Payer: Medicare Other | Admitting: Endocrinology

## 2011-07-18 ENCOUNTER — Encounter: Payer: Self-pay | Admitting: Endocrinology

## 2011-07-18 DIAGNOSIS — I509 Heart failure, unspecified: Secondary | ICD-10-CM

## 2011-07-18 DIAGNOSIS — J90 Pleural effusion, not elsewhere classified: Secondary | ICD-10-CM

## 2011-07-18 DIAGNOSIS — E0590 Thyrotoxicosis, unspecified without thyrotoxic crisis or storm: Secondary | ICD-10-CM

## 2011-07-18 DIAGNOSIS — D509 Iron deficiency anemia, unspecified: Secondary | ICD-10-CM

## 2011-07-18 DIAGNOSIS — R0602 Shortness of breath: Secondary | ICD-10-CM

## 2011-07-18 LAB — CBC WITH DIFFERENTIAL/PLATELET
Basophils Absolute: 0 10*3/uL (ref 0.0–0.1)
Eosinophils Relative: 1.7 % (ref 0.0–5.0)
Hemoglobin: 11.1 g/dL — ABNORMAL LOW (ref 13.0–17.0)
Lymphocytes Relative: 14.5 % (ref 12.0–46.0)
Monocytes Relative: 10.7 % (ref 3.0–12.0)
Neutro Abs: 5.3 10*3/uL (ref 1.4–7.7)
Platelets: 301 10*3/uL (ref 150.0–400.0)
RDW: 16 % — ABNORMAL HIGH (ref 11.5–14.6)
WBC: 7.3 10*3/uL (ref 4.5–10.5)

## 2011-07-18 LAB — BRAIN NATRIURETIC PEPTIDE: Pro B Natriuretic peptide (BNP): 156 pg/mL — ABNORMAL HIGH (ref 0.0–100.0)

## 2011-07-18 NOTE — Progress Notes (Signed)
Subjective:    Patient ID: [name] [name], male    DOB: 1929/02/26, 77 y.o.   MRN: [id number]  HPI Pt states 1 week of mild sob sensation in the chest, in the context of exertion.  No assoc chest pain.   Past Medical History  Diagnosis Date  . GASTRIC POLYP 05/13/2009  . COLONIC POLYPS, ADENOMATOUS 01/14/2008  . HYPERLIPIDEMIA 06/01/2007  . ANEMIA, IRON DEFICIENCY 10/13/2008  . UNSPECIFIED ANEMIA 09/18/2008  . COAGULOPATHY, COUMADIN-INDUCED 01/14/2008  . ANXIETY 09/04/2008  . HYPERTENSION 06/01/2007  . CORONARY ARTERY DISEASE 06/01/2007  . ATRIAL FIBRILLATION, CHRONIC 01/14/2008  . HEMORRHOIDS, EXTERNAL 01/14/2008  . UNSPECIFIED VENOUS INSUFFICIENCY 09/18/2008  . PLEURAL EFFUSION, LEFT 10/13/2008  . GERD 06/01/2007  . BARRETTS ESOPHAGUS 03/20/2009  . HIATAL HERNIA 01/14/2008  . DIVERTICULOSIS, COLON 01/14/2008  . PROSTATE CANCER, HX OF 01/14/2008  . CATARACTS, BILATERAL, HX OF 01/14/2008  . CEREBROVASCULAR ACCIDENT, HX OF 08/18/2008  . PERSONAL HX COLONIC POLYPS 01/01/2010  . BASAL CELL CARCINOMA OF SKIN SITE UNSPECIFIED 09/28/2010  . HYPERTHYROIDISM 01/06/2011  . Hyperglycemia   . Elevated PSA   . Restless leg syndrome   . Mitral regurgitation     s/p MVR, bioprosthetic    Past Surgical History  Procedure Date  . Prostatectomy   . Tonsillectomy   . Cystectomy     Left leg  . Pacemaker placement     [place]  . Coronary artery bypass graft   . Mitral valve replacement   . Maze procedure 08/09    [facility]  . Cardiac catheterization 11/01/1990  . Skin cancer excision     removed from forehead and right leg    History   Social History  . Marital Status: Married    Spouse Name: N/A    Number of Children: N/A  . Years of Education: N/A   Occupational History  . Retired    Social History Main Topics  . Smoking status: Former Smoker    Types: Pipe  . Smokeless tobacco: Not on file   Comment: quit over 40 years ago, never smoked cigarettes  . Alcohol Use: No  . Drug Use: No  . Sexually  Active: Not on file   Other Topics Concern  . Not on file   Social History Narrative  . No narrative on file    Current Outpatient Prescriptions on File Prior to Visit  Medication Sig Dispense Refill  . amiodarone (PACERONE) 200 MG tablet Take 200 mg by mouth daily.        Marland Kitchen aspirin (BAYER LOW STRENGTH) 81 MG EC tablet Take 81 mg by mouth daily.        . metoprolol tartrate (LOPRESSOR) 25 MG tablet Take 25 mg by mouth 2 (two) times daily.        . Multiple Vitamins-Minerals (CENTRUM SILVER ULTRA MENS) TABS Take 1 tablet by mouth daily.        . pantoprazole (PROTONIX) 40 MG tablet Take 40 mg by mouth 2 (two) times daily.        . simvastatin (ZOCOR) 40 MG tablet Take 40 mg by mouth at bedtime.        Marland Kitchen warfarin (COUMADIN) 3 MG tablet Take 1 tablet by mouth as directed       . Ferrous Sulfate (IRON) 325 (65 FE) MG TABS Take 1 tablet by mouth 2 (two) times daily.         No Known Allergies  Family History  Problem Relation Age of Onset  .  Cancer Mother     Breast Cancer    BP 108/68  Pulse 102  Temp(Src) 97.3 F (36.3 C) (Oral)  Ht 5\' 10"  (1.778 m)  Wt 173 lb 12.8 oz (78.835 kg)  BMI 24.94 kg/m2  SpO2 95%    Review of Systems  Constitutional: Negative for fever and diaphoresis.  Eyes: Negative for visual disturbance.  Cardiovascular: Negative for leg swelling.  Gastrointestinal: Negative for anal bleeding.       He stopped fe tabs, due to constipation  Genitourinary: Negative for hematuria.  Musculoskeletal: Negative for gait problem.  Skin:       No change in chronic easy bruising  Neurological: Negative for syncope.  Psychiatric/Behavioral: Negative for confusion.   Denies cough    Objective:   Physical Exam VITAL SIGNS:  See vs page GENERAL: no distress LUNGS:  Clear to auscultation, except for decreased bs at the left lower lung field.  HEART:  Regular rate and rhythm.  There is a soft systolic murmur noted. Normal S1,S2.   Ext: 1+ bilat leg edema   (  i reviewed all of today's labs) Cxr: nad    Assessment & Plan:  CHF, worse fe-deficiency anemia, needs increased rx Pleural effusion, worse Hyperthyroidism, overcontrolled Sob could be caused or contributed to by any of the above.  Constipation.  This limits rx of anemia

## 2011-07-18 NOTE — Patient Instructions (Addendum)
Chest x-ray, and blood tests are being requested for you today.  please call [phone] to hear your test results.  You will be prompted to enter the 9-digit "MRN" number that appears at the top left of this page, followed by #.  Then you will hear the message. Please make a regular physical appointment in 3 months. (update: i left message on phone-tree:  Reduce tapazole to 10 mg q.  Increase lasix to 40/d.  Take fe 1/d.  Take miralax.  Ref pulm).

## 2011-07-19 LAB — BASIC METABOLIC PANEL
Calcium: 8.6 mg/dL (ref 8.4–10.5)
GFR: 88.48 mL/min (ref >60.00)
Sodium: 142 mEq/L (ref 135–145)

## 2011-07-19 LAB — IBC PANEL
Iron: 24 ug/dL — ABNORMAL LOW (ref 42–165)
Saturation Ratios: 10.3 % — ABNORMAL LOW (ref 20.0–50.0)

## 2011-07-19 MED ORDER — FUROSEMIDE 40 MG PO TABS: 40.0000 mg | ORAL_TABLET | Freq: Two times a day (BID) | ORAL | Status: DC

## 2011-07-22 ENCOUNTER — Telehealth: Payer: Self-pay

## 2011-07-22 NOTE — Telephone Encounter (Signed)
Please at least try it, as your blood test and x-rays say you have too much fluid

## 2011-07-22 NOTE — Telephone Encounter (Signed)
Pt states that 20 mg Furosemide causes him to go to the restroom every 20 minutes. Pt also wanted me to inform MD that since having Prostate surgery (which "messed up bladder") he had more frequent restroom visits and furosemide makes it worse. Pt says he will try 40 mg tomorrow and if he is not able to continue he will call back Monday to inform.

## 2011-07-22 NOTE — Telephone Encounter (Signed)
Pt called stating that Furosemide 20 mg causes frequent urination through out the day. Pt is concerned about increasing to 40 mg for this same reason, please advise.

## 2011-07-27 ENCOUNTER — Encounter: Payer: Self-pay | Admitting: Internal Medicine

## 2011-07-29 ENCOUNTER — Ambulatory Visit (INDEPENDENT_AMBULATORY_CARE_PROVIDER_SITE_OTHER): Payer: Medicare Other | Admitting: Critical Care Medicine

## 2011-07-29 ENCOUNTER — Encounter: Payer: Self-pay | Admitting: Critical Care Medicine

## 2011-07-29 DIAGNOSIS — J90 Pleural effusion, not elsewhere classified: Secondary | ICD-10-CM

## 2011-07-29 DIAGNOSIS — D6832 Hemorrhagic disorder due to extrinsic circulating anticoagulants: Secondary | ICD-10-CM

## 2011-07-29 DIAGNOSIS — D689 Coagulation defect, unspecified: Secondary | ICD-10-CM

## 2011-07-29 NOTE — Patient Instructions (Addendum)
A thoracentesis will be obtained on Thursday 08/04/11 Hold coumadin starting with your Sunday 9/23 dose Come to [facility] for lab work on Wed 08/03/11 to check the protime prior to the procedure for 9/27 Return in two weeks for follow up

## 2011-07-29 NOTE — Progress Notes (Signed)
Subjective:    Patient ID: [name] [name], male    DOB: July 18, 1929, 77 y.o.   MRN: [id number]  HPI Comments: Was visiting in [place] in 03/2011.  Hosp for PNA LLL .  rx and released.  Was ok after this.  Then notes over the past three weeks notes more dyspnea.    Shortness of Breath This is a chronic problem. The current episode started more than 1 month ago (noted more dyspnea 03/2011). Episode frequency: Exertional only, ok at rest  The problem has been gradually worsening. Pertinent negatives include no abdominal pain, chest pain, ear pain, fever, headaches, hemoptysis, leg pain, leg swelling, neck pain, orthopnea, PND, rash, rhinorrhea, sore throat, sputum production, vomiting or wheezing. The symptoms are aggravated by any activity. Associated symptoms comments: No cough. He has tried nothing for the symptoms. His past medical history is significant for pneumonia. There is no history of asthma, COPD, DVT, a heart failure or PE.    Past Medical History  Diagnosis Date  . GASTRIC POLYP 05/13/2009  . COLONIC POLYPS, ADENOMATOUS 01/14/2008  . HYPERLIPIDEMIA 06/01/2007  . ANEMIA, IRON DEFICIENCY 10/13/2008  . UNSPECIFIED ANEMIA 09/18/2008  . COAGULOPATHY, COUMADIN-INDUCED 01/14/2008  . ANXIETY 09/04/2008  . HYPERTENSION 06/01/2007  . CORONARY ARTERY DISEASE 06/01/2007  . ATRIAL FIBRILLATION, CHRONIC 01/14/2008  . HEMORRHOIDS, EXTERNAL 01/14/2008  . UNSPECIFIED VENOUS INSUFFICIENCY 09/18/2008  . PLEURAL EFFUSION, LEFT 10/13/2008  . GERD 06/01/2007  . BARRETTS ESOPHAGUS 03/20/2009  . HIATAL HERNIA 01/14/2008  . DIVERTICULOSIS, COLON 01/14/2008  . PROSTATE CANCER, HX OF 01/14/2008  . CATARACTS, BILATERAL, HX OF 01/14/2008  . CEREBROVASCULAR ACCIDENT, HX OF 08/18/2008  . PERSONAL HX COLONIC POLYPS 01/01/2010  . BASAL CELL CARCINOMA OF SKIN SITE UNSPECIFIED 09/28/2010  . HYPERTHYROIDISM 01/06/2011  . Hyperglycemia   . Elevated PSA   . Restless leg syndrome   . Mitral regurgitation     s/p MVR, bioprosthetic       Family History  Problem Relation Age of Onset  . Cancer Mother     Breast Cancer     History   Social History  . Marital Status: Married    Spouse Name: N/A    Number of Children: N/A  . Years of Education: N/A   Occupational History  . Retired    Social History Main Topics  . Smoking status: Former Smoker -- 2 years    Types: Pipe    Quit date: 11/07/1965  . Smokeless tobacco: Never Used   Comment: quit over 40 years ago, never smoked cigarettes  . Alcohol Use: No  . Drug Use: No  . Sexually Active: Not on file   Other Topics Concern  . Not on file   Social History Narrative  . No narrative on file     No Known Allergies   Outpatient Prescriptions Prior to Visit  Medication Sig Dispense Refill  . amiodarone (PACERONE) 200 MG tablet Take 200 mg by mouth daily.        Marland Kitchen aspirin (BAYER LOW STRENGTH) 81 MG EC tablet Take 81 mg by mouth daily.        . Ferrous Sulfate (IRON) 325 (65 FE) MG TABS Take 1 tablet by mouth daily.       . furosemide (LASIX) 40 MG tablet Take 1 tablet (40 mg total) by mouth 2 (two) times daily.  30 tablet  11  . methimazole (TAPAZOLE) 10 MG tablet Take 10 mg by mouth daily.        Marland Kitchen  metoprolol tartrate (LOPRESSOR) 25 MG tablet Take 25 mg by mouth 2 (two) times daily.        . Multiple Vitamins-Minerals (CENTRUM SILVER ULTRA MENS) TABS Take 1 tablet by mouth daily.        . pantoprazole (PROTONIX) 40 MG tablet Take 40 mg by mouth 2 (two) times daily.        . simvastatin (ZOCOR) 40 MG tablet Take 40 mg by mouth at bedtime.        Marland Kitchen warfarin (COUMADIN) 3 MG tablet Take 1 tablet by mouth as directed           Review of Systems  Constitutional: Positive for activity change, fatigue and unexpected weight change. Negative for fever, chills, diaphoresis and appetite change.  HENT: Positive for voice change. Negative for hearing loss, ear pain, nosebleeds, congestion, sore throat, facial swelling, rhinorrhea, sneezing, mouth sores, trouble  swallowing, neck pain, neck stiffness, dental problem, postnasal drip, sinus pressure, tinnitus and ear discharge.   Eyes: Negative for photophobia, discharge, itching and visual disturbance.  Respiratory: Positive for shortness of breath. Negative for apnea, cough, hemoptysis, sputum production, choking, chest tightness, wheezing and stridor.   Cardiovascular: Negative for chest pain, palpitations, orthopnea, leg swelling and PND.  Gastrointestinal: Positive for constipation. Negative for nausea, vomiting, abdominal pain, blood in stool and abdominal distention.  Genitourinary: Positive for dysuria, frequency and difficulty urinating. Negative for urgency, hematuria, flank pain and decreased urine volume.  Musculoskeletal: Negative for myalgias, back pain, joint swelling, arthralgias and gait problem.  Skin: Negative for color change, pallor and rash.  Neurological: Negative for dizziness, tremors, seizures, syncope, speech difficulty, weakness, light-headedness, numbness and headaches.  Hematological: Negative for adenopathy. Does not bruise/bleed easily.  Psychiatric/Behavioral: Negative for confusion, sleep disturbance and agitation. The patient is not nervous/anxious.        Objective:   Physical Exam Filed Vitals:   07/29/11 1549  BP: 108/68  Pulse: 91  Temp: 97.9 F (36.6 C)  TempSrc: Oral  Height: 5\' 10"  (1.778 m)  Weight: 164 lb 3.2 oz (74.481 kg)  SpO2: 92%    Gen: Pleasant, well-nourished, in no distress,  normal affect  ENT: No lesions,  mouth clear,  oropharynx clear, no postnasal drip  Neck: No JVD, no TMG, no carotid bruits  Lungs: No use of accessory muscles, no dullness to percussion, decreased BS 1/2 way up on L  Cardiovascular: RRR, heart sounds normal, no murmur or gallops, no peripheral edema  Abdomen: soft and NT, no HSM,  BS normal  Musculoskeletal: No deformities, no cyanosis or clubbing  Neuro: alert, non focal  Skin: Warm, no lesions or  rashes   CXR: increased L pleural effusion    Assessment & Plan:   PLEURAL EFFUSION, LEFT Increased L pleural effusion ?parapneumonic from prior PNA vs transudative due to heart disease  Plan Tap L effusion under u/s  Hold coumadin and recheck INR to insure is <1.5 for tap     Updated Medication List Outpatient Encounter Prescriptions as of 07/29/2011  Medication Sig Dispense Refill  . amiodarone (PACERONE) 200 MG tablet Take 200 mg by mouth daily.        Marland Kitchen aspirin (BAYER LOW STRENGTH) 81 MG EC tablet Take 81 mg by mouth daily.        . digoxin (LANOXIN) 0.125 MG tablet Take 0.5 tablets by mouth Daily.      . Ferrous Sulfate (IRON) 325 (65 FE) MG TABS Take 1 tablet by mouth daily.       Marland Kitchen  furosemide (LASIX) 40 MG tablet Take 1 tablet (40 mg total) by mouth 2 (two) times daily.  30 tablet  11  . methimazole (TAPAZOLE) 10 MG tablet Take 10 mg by mouth daily.        . metoprolol tartrate (LOPRESSOR) 25 MG tablet Take 25 mg by mouth 2 (two) times daily.        . Multiple Vitamins-Minerals (CENTRUM SILVER ULTRA MENS) TABS Take 1 tablet by mouth daily.        . pantoprazole (PROTONIX) 40 MG tablet Take 40 mg by mouth 2 (two) times daily.        . simvastatin (ZOCOR) 40 MG tablet Take 40 mg by mouth at bedtime.        . Tamsulosin HCl (FLOMAX) 0.4 MG CAPS Take 1 tablet by mouth Daily.      Marland Kitchen warfarin (COUMADIN) 3 MG tablet Take 1 tablet by mouth as directed

## 2011-07-30 NOTE — Assessment & Plan Note (Signed)
Increased L pleural effusion ?parapneumonic from prior PNA vs transudative due to heart disease  Plan Tap L effusion under u/s  Hold coumadin and recheck INR to insure is <1.5 for tap

## 2011-08-03 ENCOUNTER — Ambulatory Visit: Payer: Medicare Other

## 2011-08-03 DIAGNOSIS — D6832 Hemorrhagic disorder due to extrinsic circulating anticoagulants: Secondary | ICD-10-CM

## 2011-08-03 LAB — PROTIME-INR
INR: 1.59 — ABNORMAL HIGH (ref <1.50)
Prothrombin Time: 19.5 seconds — ABNORMAL HIGH (ref 11.6–15.2)

## 2011-08-04 ENCOUNTER — Other Ambulatory Visit: Payer: Self-pay | Admitting: Diagnostic Radiology

## 2011-08-04 ENCOUNTER — Other Ambulatory Visit: Payer: Self-pay | Admitting: Critical Care Medicine

## 2011-08-04 ENCOUNTER — Ambulatory Visit (HOSPITAL_COMMUNITY)
Admission: RE | Admit: 2011-08-04 | Discharge: 2011-08-04 | Disposition: A | Discharge status: Home / Self Care | Payer: Medicare Other | Source: Ambulatory Visit | Attending: Critical Care Medicine | Admitting: Critical Care Medicine

## 2011-08-04 ENCOUNTER — Other Ambulatory Visit (HOSPITAL_COMMUNITY): Payer: Medicare Other

## 2011-08-04 ENCOUNTER — Inpatient Hospital Stay (HOSPITAL_COMMUNITY): Admit: 2011-08-04 | Payer: Self-pay

## 2011-08-04 DIAGNOSIS — J90 Pleural effusion, not elsewhere classified: Secondary | ICD-10-CM

## 2011-08-04 DIAGNOSIS — Z8546 Personal history of malignant neoplasm of prostate: Secondary | ICD-10-CM | POA: Insufficient documentation

## 2011-08-04 LAB — BODY FLUID CELL COUNT WITH DIFFERENTIAL
Eos, Fluid: 0 %
Monocyte-Macrophage-Serous Fluid: 31 % — ABNORMAL LOW (ref 50–90)

## 2011-08-04 LAB — PROTEIN, BODY FLUID: Total protein, fluid: 3.6 g/dL

## 2011-08-04 LAB — LACTATE DEHYDROGENASE, PLEURAL OR PERITONEAL FLUID: LD, Fluid: 173 U/L — ABNORMAL HIGH (ref 3–23)

## 2011-08-04 LAB — PH, BODY FLUID: pH, Fluid: 8

## 2011-08-07 LAB — BODY FLUID CULTURE: Culture: NO GROWTH

## 2011-08-09 ENCOUNTER — Telehealth: Payer: Self-pay | Admitting: Critical Care Medicine

## 2011-08-09 LAB — DIFFERENTIAL
Eosinophils Absolute: 0.1
Lymphs Abs: 1.2
Monocytes Absolute: 0.6
Monocytes Relative: 10
Neutrophils Relative %: 67

## 2011-08-09 LAB — POCT I-STAT, CHEM 8
Chloride: 107
Creatinine, Ser: 1.1
Glucose, Bld: 98
Hemoglobin: 9.9 — ABNORMAL LOW
Potassium: 4

## 2011-08-09 LAB — CULTURE, BLOOD (ROUTINE X 2)
Culture: NO GROWTH
Culture: NO GROWTH

## 2011-08-09 LAB — CBC
HCT: 29.9 — ABNORMAL LOW
Hemoglobin: 9.8 — ABNORMAL LOW
MCV: 85.2
RBC: 3.51 — ABNORMAL LOW
WBC: 5.9

## 2011-08-09 LAB — PROTIME-INR
INR: 2.1 — ABNORMAL HIGH
INR: 2.1 — ABNORMAL HIGH
Prothrombin Time: 25.2 — ABNORMAL HIGH

## 2011-08-09 NOTE — Telephone Encounter (Signed)
I spoke with pt and he states he was returning Dr. Delford Field call and he was advised his test results will be discussed with him on that day. Nothing was needed from pt and per [place] Dr. Delford Field will review everything at his OV on thursday

## 2011-08-09 NOTE — Telephone Encounter (Signed)
Called, spoke with pt.  He was instructed to f/u with PW in 2 wks from last OV on 07/29/11  -- he would like to schedule this appt.  I offered tomorrow in [place] but pt already has an appt that conflicts with this.  He is ok with coming to [facility] office on Thursday -- appt scheduled for 08/11/11 at 4:15pm with PW in [facility] -- pt aware and was given [facility] address and phone number.  Nothing further needed at this time.

## 2011-08-11 ENCOUNTER — Ambulatory Visit (INDEPENDENT_AMBULATORY_CARE_PROVIDER_SITE_OTHER): Payer: Medicare Other | Admitting: Critical Care Medicine

## 2011-08-11 ENCOUNTER — Encounter: Payer: Self-pay | Admitting: Critical Care Medicine

## 2011-08-11 DIAGNOSIS — J90 Pleural effusion, not elsewhere classified: Secondary | ICD-10-CM

## 2011-08-11 MED ORDER — FUROSEMIDE 40 MG PO TABS: 40.0000 mg | ORAL_TABLET | Freq: Two times a day (BID) | ORAL | Status: DC

## 2011-08-11 NOTE — Patient Instructions (Signed)
Resume lasix 1/2 tablet twice daily Return in 2 months Call sooner if breathing is worse

## 2011-08-11 NOTE — Progress Notes (Signed)
Subjective:    Patient ID: [name] [name], male    DOB: October 15, 1929, 77 y.o.   MRN: [id number]  HPI Comments: Was visiting in [place] in 03/2011.  Hosp for PNA LLL .  rx and released.  Was ok after this.  Then notes over the past three weeks notes more dyspnea.    Shortness of Breath This is a chronic problem. The current episode started more than 1 month ago (noted more dyspnea 03/2011). Episode frequency: Exertional only, ok at rest  The problem has been rapidly improving. Pertinent negatives include no abdominal pain, chest pain, ear pain, fever, headaches, hemoptysis, leg pain, leg swelling, neck pain, orthopnea, PND, rash, rhinorrhea, sore throat, sputum production, vomiting or wheezing. The symptoms are aggravated by exercise. Associated symptoms comments: No cough. He has tried nothing for the symptoms. His past medical history is significant for pneumonia. There is no history of asthma, COPD, DVT, a heart failure or PE.   10/4 L effusion tapped and exudate.   No Ca , no bacteria. Now no cough.  Less dyspneic.  If do a lot of exercise will be dyspneic.   Past Medical History  Diagnosis Date  . GASTRIC POLYP 05/13/2009  . COLONIC POLYPS, ADENOMATOUS 01/14/2008  . HYPERLIPIDEMIA 06/01/2007  . ANEMIA, IRON DEFICIENCY 10/13/2008  . UNSPECIFIED ANEMIA 09/18/2008  . COAGULOPATHY, COUMADIN-INDUCED 01/14/2008  . ANXIETY 09/04/2008  . HYPERTENSION 06/01/2007  . CORONARY ARTERY DISEASE 06/01/2007  . ATRIAL FIBRILLATION, CHRONIC 01/14/2008  . HEMORRHOIDS, EXTERNAL 01/14/2008  . UNSPECIFIED VENOUS INSUFFICIENCY 09/18/2008  . PLEURAL EFFUSION, LEFT 10/13/2008  . GERD 06/01/2007  . BARRETTS ESOPHAGUS 03/20/2009  . HIATAL HERNIA 01/14/2008  . DIVERTICULOSIS, COLON 01/14/2008  . PROSTATE CANCER, HX OF 01/14/2008  . CATARACTS, BILATERAL, HX OF 01/14/2008  . CEREBROVASCULAR ACCIDENT, HX OF 08/18/2008  . PERSONAL HX COLONIC POLYPS 01/01/2010  . BASAL CELL CARCINOMA OF SKIN SITE UNSPECIFIED 09/28/2010  .  HYPERTHYROIDISM 01/06/2011  . Hyperglycemia   . Elevated PSA   . Restless leg syndrome   . Mitral regurgitation     s/p MVR, bioprosthetic     Family History  Problem Relation Age of Onset  . Cancer Mother     Breast Cancer     History   Social History  . Marital Status: Married    Spouse Name: N/A    Number of Children: N/A  . Years of Education: N/A   Occupational History  . Retired    Social History Main Topics  . Smoking status: Former Smoker -- 2 years    Types: Pipe    Quit date: 11/07/1965  . Smokeless tobacco: Never Used   Comment: quit over 40 years ago, never smoked cigarettes  . Alcohol Use: No  . Drug Use: No  . Sexually Active: Not on file   Other Topics Concern  . Not on file   Social History Narrative  . No narrative on file     No Known Allergies   Outpatient Prescriptions Prior to Visit  Medication Sig Dispense Refill  . amiodarone (PACERONE) 200 MG tablet Take 200 mg by mouth daily.        Marland Kitchen aspirin (BAYER LOW STRENGTH) 81 MG EC tablet Take 81 mg by mouth daily.        . digoxin (LANOXIN) 0.125 MG tablet Take 0.5 tablets by mouth Daily.      . Ferrous Sulfate (IRON) 325 (65 FE) MG TABS Take 1 tablet by mouth daily.       Marland Kitchen  methimazole (TAPAZOLE) 10 MG tablet Take 10 mg by mouth daily.        . metoprolol tartrate (LOPRESSOR) 25 MG tablet Take 25 mg by mouth 2 (two) times daily.        . Multiple Vitamins-Minerals (CENTRUM SILVER ULTRA MENS) TABS Take 1 tablet by mouth daily.        . pantoprazole (PROTONIX) 40 MG tablet Take 40 mg by mouth 2 (two) times daily.        . simvastatin (ZOCOR) 40 MG tablet Take 40 mg by mouth at bedtime.        . Tamsulosin HCl (FLOMAX) 0.4 MG CAPS Take 1 tablet by mouth Daily.      Marland Kitchen warfarin (COUMADIN) 3 MG tablet Take 1 tablet by mouth as directed       . furosemide (LASIX) 40 MG tablet Take 1 tablet (40 mg total) by mouth 2 (two) times daily.  30 tablet  11      Review of Systems  Constitutional: Positive  for activity change, fatigue and unexpected weight change. Negative for fever, chills, diaphoresis and appetite change.  HENT: Positive for voice change. Negative for hearing loss, ear pain, nosebleeds, congestion, sore throat, facial swelling, rhinorrhea, sneezing, mouth sores, trouble swallowing, neck pain, neck stiffness, dental problem, postnasal drip, sinus pressure, tinnitus and ear discharge.   Eyes: Negative for photophobia, discharge, itching and visual disturbance.  Respiratory: Positive for shortness of breath. Negative for apnea, cough, hemoptysis, sputum production, choking, chest tightness, wheezing and stridor.   Cardiovascular: Negative for chest pain, palpitations, orthopnea, leg swelling and PND.  Gastrointestinal: Positive for constipation. Negative for nausea, vomiting, abdominal pain, blood in stool and abdominal distention.  Genitourinary: Positive for dysuria, frequency and difficulty urinating. Negative for urgency, hematuria, flank pain and decreased urine volume.  Musculoskeletal: Negative for myalgias, back pain, joint swelling, arthralgias and gait problem.  Skin: Negative for color change, pallor and rash.  Neurological: Negative for dizziness, tremors, seizures, syncope, speech difficulty, weakness, light-headedness, numbness and headaches.  Hematological: Negative for adenopathy. Does not bruise/bleed easily.  Psychiatric/Behavioral: Negative for confusion, sleep disturbance and agitation. The patient is not nervous/anxious.        Objective:   Physical Exam  Filed Vitals:   08/11/11 1611  BP: 122/88  Pulse: 81  Temp: 97.9 F (36.6 C)  TempSrc: Oral  Height: 5\' 10"  (1.778 m)  Weight: 173 lb (78.472 kg)  SpO2: 99%    Gen: Pleasant, well-nourished, in no distress,  normal affect  ENT: No lesions,  mouth clear,  oropharynx clear, no postnasal drip  Neck: No JVD, no TMG, no carotid bruits  Lungs: No use of accessory muscles, no dullness to percussion,  decreased BS 1/4 way up on L  Cardiovascular: RRR, heart sounds normal, no murmur or gallops, 2+ peripheral edema  Abdomen: soft and NT, no HSM,  BS normal  Musculoskeletal: No deformities, no cyanosis or clubbing  Neuro: alert, non focal  Skin: Warm, no lesions or rashes   08/04/2011 status post thoracentesis CXR: Decreased L pleural effusion    Assessment & Plan:   PLEURAL EFFUSION, LEFT Increased L pleural effusion  With mixed exudate and transudate features Etiology likely due to chronic pleuritis from prior community-acquired pneumonia with associated congestive heart failure and tendency towards fluid retention Status post drainage 07/2011  Plan Continued observation and resume Lasix at 20 mg twice daily Return 2 months     Updated Medication List Outpatient Encounter Prescriptions as of 08/11/2011  Medication Sig Dispense Refill  . amiodarone (PACERONE) 200 MG tablet Take 200 mg by mouth daily.        Marland Kitchen aspirin (BAYER LOW STRENGTH) 81 MG EC tablet Take 81 mg by mouth daily.        . digoxin (LANOXIN) 0.125 MG tablet Take 0.5 tablets by mouth Daily.      . Ferrous Sulfate (IRON) 325 (65 FE) MG TABS Take 1 tablet by mouth daily.       . methimazole (TAPAZOLE) 10 MG tablet Take 10 mg by mouth daily.        . metoprolol tartrate (LOPRESSOR) 25 MG tablet Take 25 mg by mouth 2 (two) times daily.        . Multiple Vitamins-Minerals (CENTRUM SILVER ULTRA MENS) TABS Take 1 tablet by mouth daily.        . pantoprazole (PROTONIX) 40 MG tablet Take 40 mg by mouth 2 (two) times daily.        . simvastatin (ZOCOR) 40 MG tablet Take 40 mg by mouth at bedtime.        . Tamsulosin HCl (FLOMAX) 0.4 MG CAPS Take 1 tablet by mouth Daily.      Marland Kitchen warfarin (COUMADIN) 3 MG tablet Take 1 tablet by mouth as directed       . furosemide (LASIX) 40 MG tablet Take 1 tablet (40 mg total) by mouth 2 (two) times daily. Take 1/2 tablet twice daily  30 tablet  11  . DISCONTD: furosemide (LASIX) 40 MG  tablet Take 1 tablet (40 mg total) by mouth 2 (two) times daily.  30 tablet  11

## 2011-08-12 NOTE — Assessment & Plan Note (Signed)
Increased L pleural effusion  With mixed exudate and transudate features Etiology likely due to chronic pleuritis from prior community-acquired pneumonia with associated congestive heart failure and tendency towards fluid retention Status post drainage 07/2011  Plan Continued observation and resume Lasix at 20 mg twice daily Return 2 months

## 2011-08-15 ENCOUNTER — Ambulatory Visit: Payer: Medicare Other | Admitting: Endocrinology

## 2011-08-18 ENCOUNTER — Other Ambulatory Visit (INDEPENDENT_AMBULATORY_CARE_PROVIDER_SITE_OTHER): Payer: Medicare Other

## 2011-08-18 ENCOUNTER — Encounter: Payer: Self-pay | Admitting: Internal Medicine

## 2011-08-18 ENCOUNTER — Encounter: Payer: Self-pay | Admitting: Endocrinology

## 2011-08-18 ENCOUNTER — Ambulatory Visit (INDEPENDENT_AMBULATORY_CARE_PROVIDER_SITE_OTHER): Payer: Medicare Other | Admitting: Endocrinology

## 2011-08-18 ENCOUNTER — Ambulatory Visit (INDEPENDENT_AMBULATORY_CARE_PROVIDER_SITE_OTHER): Payer: Medicare Other | Admitting: Internal Medicine

## 2011-08-18 VITALS — BP 102/64 | HR 90 | Temp 97.3°F | Ht 70.0 in | Wt 166.5 lb

## 2011-08-18 DIAGNOSIS — K219 Gastro-esophageal reflux disease without esophagitis: Secondary | ICD-10-CM

## 2011-08-18 DIAGNOSIS — Z86010 Personal history of colonic polyps: Secondary | ICD-10-CM

## 2011-08-18 DIAGNOSIS — E0590 Thyrotoxicosis, unspecified without thyrotoxic crisis or storm: Secondary | ICD-10-CM

## 2011-08-18 DIAGNOSIS — Z23 Encounter for immunization: Secondary | ICD-10-CM

## 2011-08-18 DIAGNOSIS — D509 Iron deficiency anemia, unspecified: Secondary | ICD-10-CM

## 2011-08-18 DIAGNOSIS — K2270 Barrett's esophagus without dysplasia: Secondary | ICD-10-CM

## 2011-08-18 DIAGNOSIS — J90 Pleural effusion, not elsewhere classified: Secondary | ICD-10-CM

## 2011-08-18 LAB — CBC WITH DIFFERENTIAL/PLATELET
Basophils Absolute: 0 10*3/uL (ref 0.0–0.1)
Basophils Relative: 0.2 % (ref 0.0–3.0)
Eosinophils Relative: 0.3 % (ref 0.0–5.0)
HCT: 32.2 % — ABNORMAL LOW (ref 39.0–52.0)
Hemoglobin: 10.7 g/dL — ABNORMAL LOW (ref 13.0–17.0)
Lymphocytes Relative: 15.9 % (ref 12.0–46.0)
Lymphs Abs: 0.9 10*3/uL (ref 0.7–4.0)
Monocytes Relative: 8.3 % (ref 3.0–12.0)
Neutro Abs: 4.2 10*3/uL (ref 1.4–7.7)
RBC: 3.79 Mil/uL — ABNORMAL LOW (ref 4.22–5.81)
WBC: 5.6 10*3/uL (ref 4.5–10.5)

## 2011-08-18 LAB — T4, FREE: Free T4: 0.69 ng/dL (ref 0.60–1.60)

## 2011-08-18 LAB — FERRITIN: Ferritin: 100.9 ng/mL (ref 22.0–322.0)

## 2011-08-18 LAB — IBC PANEL: Saturation Ratios: 20.5 % (ref 20.0–50.0)

## 2011-08-18 LAB — BASIC METABOLIC PANEL
Calcium: 8.4 mg/dL (ref 8.4–10.5)
GFR: 86.2 mL/min (ref >60.00)
Potassium: 3.9 mEq/L (ref 3.5–5.1)
Sodium: 141 mEq/L (ref 135–145)

## 2011-08-18 MED ORDER — METHIMAZOLE 5 MG PO TABS: 5.0000 mg | ORAL_TABLET | Freq: Three times a day (TID) | ORAL | Status: DC

## 2011-08-18 NOTE — Assessment & Plan Note (Signed)
Add ferritin to today's labs I think this is chronic disease anemia Will let him know if he still needs iron (it constipates him)

## 2011-08-18 NOTE — Patient Instructions (Signed)
We have added a Ferritin level to your labs that were completed earlier today. Please follow up as needed.

## 2011-08-18 NOTE — Assessment & Plan Note (Signed)
At his age and comorbidities no more screening He understands and accepts

## 2011-08-18 NOTE — Progress Notes (Signed)
Subjective:    Patient ID: [name] [name], male    DOB: 08/23/1929, 77 y.o.   MRN: [id number]  HPI Pt states 3 weeks of intermittent moderate pain at the left post hip and flank, worse in the context of bending over.  No assoc numbness.  Pain is slightly improved over the past few days Since on the reduced tapazole, he otherwise feels no different.   Past Medical History  Diagnosis Date  . GASTRIC POLYP 05/13/2009  . COLONIC POLYPS, ADENOMATOUS 01/14/2008  . HYPERLIPIDEMIA 06/01/2007  . ANEMIA, IRON DEFICIENCY 10/13/2008  . COAGULOPATHY, COUMADIN-INDUCED 01/14/2008  . ANXIETY 09/04/2008  . HYPERTENSION 06/01/2007  . CORONARY ARTERY DISEASE 06/01/2007  . ATRIAL FIBRILLATION, CHRONIC 01/14/2008  . UNSPECIFIED VENOUS INSUFFICIENCY 09/18/2008  . PLEURAL EFFUSION, LEFT 10/13/2008  . GERD 06/01/2007  . BARRETTS ESOPHAGUS 03/20/2009  . PROSTATE CANCER, HX OF 01/14/2008  . CATARACTS, BILATERAL, HX OF 01/14/2008  . CEREBROVASCULAR ACCIDENT, HX OF 08/18/2008  . PERSONAL HX COLONIC POLYPS 01/01/2010  . BASAL CELL CARCINOMA OF SKIN SITE UNSPECIFIED 09/28/2010  . HYPERTHYROIDISM 01/06/2011  . Hyperglycemia   . Elevated PSA   . Restless leg syndrome   . Mitral regurgitation     s/p MVR, bioprosthetic    Past Surgical History  Procedure Date  . Prostatectomy   . Tonsillectomy   . Cystectomy     Left leg  . Pacemaker placement     Ad[facility]ata  . Coronary artery bypass graft 06/2008  . Mitral valve replacement 06/2008  . Maze procedure 08/09    [facility]  . Cardiac catheterization 11/01/1990  . Skin cancer excision     removed from forehead and right leg, nose/face  . Upper gastrointestinal endoscopy 07/14/2009    erosive reflux esopagitis, Barrett's esophagus  . Colonoscopy 07/14/2009    diverticulosis, internal hemorrhoids    History   Social History  . Marital Status: Married    Spouse Name: N/A    Number of Children: 4  . Years of Education: N/A   Occupational History  . Retired    Social History Main  Topics  . Smoking status: Former Smoker -- 2 years    Types: Pipe    Quit date: 11/07/1965  . Smokeless tobacco: Never Used   Comment: quit over 40 years ago, never smoked cigarettes  . Alcohol Use: No  . Drug Use: No  . Sexually Active: Not on file   Other Topics Concern  . Not on file   Social History Narrative  . No narrative on file    Current Outpatient Prescriptions on File Prior to Visit  Medication Sig Dispense Refill  . amiodarone (PACERONE) 200 MG tablet Take 200 mg by mouth daily.        Marland Kitchen aspirin (BAYER LOW STRENGTH) 81 MG EC tablet Take 81 mg by mouth daily.        . digoxin (LANOXIN) 0.125 MG tablet Take 0.5 tablets by mouth Daily.      . Ferrous Sulfate (IRON) 325 (65 FE) MG TABS Take 1 tablet by mouth daily.       . metoprolol tartrate (LOPRESSOR) 25 MG tablet Take 25 mg by mouth 2 (two) times daily.        . Multiple Vitamins-Minerals (CENTRUM SILVER ULTRA MENS) TABS Take 1 tablet by mouth daily.        . pantoprazole (PROTONIX) 40 MG tablet Take 40 mg by mouth 2 (two) times daily.        . simvastatin (  ZOCOR) 40 MG tablet Take 40 mg by mouth at bedtime.        . Tamsulosin HCl (FLOMAX) 0.4 MG CAPS Take 1 tablet by mouth Daily.      Marland Kitchen warfarin (COUMADIN) 3 MG tablet Take 1 tablet by mouth as directed         No Known Allergies  Family History  Problem Relation Age of Onset  . Cancer Mother     Breast Cancer    BP 102/64  Pulse 90  Temp(Src) 97.3 F (36.3 C) (Oral)  Ht 5\' 10"  (1.778 m)  Wt 166 lb 8 oz (75.524 kg)  BMI 23.89 kg/m2  SpO2 99%  Review of Systems Denies hematuria and fever    Objective:   Physical Exam VITAL SIGNS:  See vs page GENERAL: no distress CHEST WALL:  Kyphosis (chronic) LUNGS:  Clear to auscultation, except for decreased bs at the left base. Left flank: minimally tender  Lab Results  Component Value Date   TSH 9.93* 08/18/2011   Lab Results  Component Value Date   WBC 5.6 08/18/2011   HGB 10.7* 08/18/2011   HCT  32.2* 08/18/2011   MCV 84.8 08/18/2011   PLT 268.0 08/18/2011      Assessment & Plan:  Low-back pain.  He declines pain meds Hyperthyroidism, still overcontrolled. fe-deficiency anemia, needs increased rx

## 2011-08-18 NOTE — Assessment & Plan Note (Signed)
No active sxs (dysphagia or heartburn) At his age and with comorbidities does not make sense to screen further I have explained and he accepts.

## 2011-08-18 NOTE — Progress Notes (Signed)
  Subjective:    Patient ID: [name] [name], male    DOB: 06-18-1929, 77 y.o.   MRN: [id number]  HPI 77 year old white man with history of adenomatous colon polyps and Barrett's esophagus with GERD. He has no significant GI complaints today though he is constipated somewhat on iron. He has a low TIBC and low iron has been given ferrous sulfate. More recently he has had problems with a left pleural effusion which was thought related to community-acquired pneumonia that developed this summer, which was diagnosed and treated in [place]. He just had a basal cell carcinoma removed from the right side of his nose and nasolabial fold area   Review of Systems As above    Objective:   Physical Exam Elderly, in no acute distress       Assessment & Plan:

## 2011-08-18 NOTE — Patient Instructions (Addendum)
blood tests are being requested for you today.  please call [phone] to hear your test results.  You will be prompted to enter the 9-digit "MRN" number that appears at the top left of this page, followed by #.  Then you will hear the message. Please make a regular physical appointment in 2 months. (update: i left message on phone-tree:  Skip 1 week of tapazole, then resume at 5 mg qd.  Increase fe to 2/d).

## 2011-08-18 NOTE — Assessment & Plan Note (Signed)
No complaints, will continue PPI, currently on pantoprazole.

## 2011-08-22 ENCOUNTER — Telehealth: Payer: Self-pay | Admitting: *Deleted

## 2011-08-22 NOTE — Telephone Encounter (Signed)
ok 

## 2011-08-22 NOTE — Telephone Encounter (Signed)
Pt's spouse informed 

## 2011-08-22 NOTE — Telephone Encounter (Signed)
Should be 5 mg qd.  i changed med list.

## 2011-08-22 NOTE — Telephone Encounter (Signed)
Pt informed. Pt states that he still has some 10mg  tablets left and wants to know if he can cut Methimazole tablet in half.

## 2011-08-22 NOTE — Telephone Encounter (Signed)
Patient requesting a call back regarding methimazole. Per pt, he was advised by Dr Everardo All to take only ONE 10 mg tab once daily. Med list shows 5 mg tid. He is confused, what dose should he be taking?

## 2011-08-30 ENCOUNTER — Other Ambulatory Visit: Payer: Self-pay

## 2011-08-30 MED ORDER — SIMVASTATIN 40 MG PO TABS: 40.0000 mg | ORAL_TABLET | Freq: Every day | ORAL | Status: DC

## 2011-09-02 ENCOUNTER — Ambulatory Visit
Admission: RE | Admit: 2011-09-02 | Discharge: 2011-09-02 | Disposition: A | Discharge status: Home / Self Care | Payer: Medicare Other | Source: Ambulatory Visit | Attending: Cardiovascular Disease | Admitting: Cardiovascular Disease

## 2011-09-02 ENCOUNTER — Other Ambulatory Visit: Payer: Self-pay | Admitting: Cardiovascular Disease

## 2011-09-02 DIAGNOSIS — I509 Heart failure, unspecified: Secondary | ICD-10-CM

## 2011-09-14 ENCOUNTER — Telehealth: Payer: Self-pay | Admitting: Endocrinology

## 2011-09-14 NOTE — Telephone Encounter (Signed)
please call patient: i received labs from dr Alanda Amass. Verify pt is on methimazole 5 mg qd. Then skip 1 week, then resume at 5 mg m,w,f. Ret as sched.

## 2011-09-14 NOTE — Telephone Encounter (Signed)
Left message for pt to callback office.  

## 2011-09-14 NOTE — Telephone Encounter (Signed)
Pt advised and will return as scheduled

## 2011-09-19 ENCOUNTER — Telehealth: Payer: Self-pay | Admitting: Internal Medicine

## 2011-09-21 MED ORDER — PANTOPRAZOLE SODIUM 40 MG PO TBEC: 40.0000 mg | DELAYED_RELEASE_TABLET | Freq: Two times a day (BID) | ORAL | Status: DC

## 2011-09-21 NOTE — Telephone Encounter (Signed)
Medication sent to [facility] for pantoprazole #180 with 3 refills.

## 2011-10-11 ENCOUNTER — Ambulatory Visit: Payer: Medicare Other | Admitting: Critical Care Medicine

## 2011-10-14 ENCOUNTER — Ambulatory Visit (INDEPENDENT_AMBULATORY_CARE_PROVIDER_SITE_OTHER): Payer: Medicare Other | Admitting: Critical Care Medicine

## 2011-10-14 ENCOUNTER — Ambulatory Visit (INDEPENDENT_AMBULATORY_CARE_PROVIDER_SITE_OTHER)
Admission: RE | Admit: 2011-10-14 | Discharge: 2011-10-14 | Disposition: A | Discharge status: Home / Self Care | Payer: Medicare Other | Source: Ambulatory Visit | Attending: Critical Care Medicine | Admitting: Critical Care Medicine

## 2011-10-14 ENCOUNTER — Encounter: Payer: Self-pay | Admitting: Critical Care Medicine

## 2011-10-14 VITALS — BP 110/70 | HR 84 | Temp 97.7°F | Ht 70.0 in | Wt 174.0 lb

## 2011-10-14 DIAGNOSIS — J90 Pleural effusion, not elsewhere classified: Secondary | ICD-10-CM

## 2011-10-14 NOTE — Progress Notes (Signed)
Subjective:    Patient ID: [name] [name], male    DOB: November 28, 1928, 77 y.o.   MRN: [id number]  HPI Comments: Was visiting in [place] in 03/2011.  Hosp for PNA LLL .  rx and released.  Was ok after this.  Then notes over the past three weeks notes more dyspnea.    Shortness of Breath This is a chronic problem. The current episode started more than 1 month ago (noted more dyspnea 03/2011). Episode frequency: Exertional only, ok at rest  The problem has been rapidly improving. Pertinent negatives include no abdominal pain, chest pain, ear pain, fever, headaches, hemoptysis, leg pain, leg swelling, neck pain, orthopnea, PND, rash, rhinorrhea, sore throat, sputum production, vomiting or wheezing. The symptoms are aggravated by exercise. Associated symptoms comments: No cough. He has tried nothing for the symptoms. His past medical history is significant for pneumonia. There is no history of asthma, COPD, DVT, a heart failure or PE.   10/4 L effusion tapped and exudate.   No Ca , no bacteria. Now no cough.  Less dyspneic.  If do a lot of exercise will be dyspneic.    12/7 At last ov we rec: increased L pleural effusion  With mixed exudate and transudate features Etiology likely due to chronic pleuritis from prior community-acquired pneumonia with associated congestive heart failure and tendency towards fluid retention Status post drainage 07/2011  Plan Continued observation and resume Lasix at 20 mg twice daily Return 2 months  No symptoms now.  No cough or dyspnea.   Past Medical History  Diagnosis Date  . GASTRIC POLYP 05/13/2009  . COLONIC POLYPS, ADENOMATOUS 01/14/2008  . HYPERLIPIDEMIA 06/01/2007  . ANEMIA, IRON DEFICIENCY 10/13/2008  . COAGULOPATHY, COUMADIN-INDUCED 01/14/2008  . ANXIETY 09/04/2008  . HYPERTENSION 06/01/2007  . CORONARY ARTERY DISEASE 06/01/2007  . ATRIAL FIBRILLATION, CHRONIC 01/14/2008  . UNSPECIFIED VENOUS INSUFFICIENCY 09/18/2008  . PLEURAL EFFUSION, LEFT 10/13/2008  .  GERD 06/01/2007  . BARRETTS ESOPHAGUS 03/20/2009  . PROSTATE CANCER, HX OF 01/14/2008  . CATARACTS, BILATERAL, HX OF 01/14/2008  . CEREBROVASCULAR ACCIDENT, HX OF 08/18/2008  . PERSONAL HX COLONIC POLYPS 01/01/2010  . BASAL CELL CARCINOMA OF SKIN SITE UNSPECIFIED 09/28/2010  . HYPERTHYROIDISM 01/06/2011  . Hyperglycemia   . Elevated PSA   . Restless leg syndrome   . Mitral regurgitation     s/p MVR, bioprosthetic     Family History  Problem Relation Age of Onset  . Cancer Mother     Breast Cancer     History   Social History  . Marital Status: Married    Spouse Name: N/A    Number of Children: 4  . Years of Education: N/A   Occupational History  . Retired    Social History Main Topics  . Smoking status: Former Smoker -- 2 years    Types: Pipe    Quit date: 11/07/1965  . Smokeless tobacco: Never Used   Comment: quit over 40 years ago, never smoked cigarettes  . Alcohol Use: No  . Drug Use: No  . Sexually Active: Not on file   Other Topics Concern  . Not on file   Social History Narrative  . No narrative on file     No Known Allergies   Outpatient Prescriptions Prior to Visit  Medication Sig Dispense Refill  . amiodarone (PACERONE) 200 MG tablet Take 200 mg by mouth daily.        Marland Kitchen aspirin (BAYER LOW STRENGTH) 81 MG EC tablet Take  81 mg by mouth daily.        . digoxin (LANOXIN) 0.125 MG tablet Take 0.5 tablets by mouth Daily.      . Ferrous Sulfate (IRON) 325 (65 FE) MG TABS Take 1 tablet by mouth daily.       . furosemide (LASIX) 20 MG tablet Take 20 mg by mouth 2 (two) times daily. Pt is taking as needed only.      . methimazole (TAPAZOLE) 5 MG tablet Take 5 mg by mouth daily.        . metoprolol tartrate (LOPRESSOR) 25 MG tablet Take 25 mg by mouth 2 (two) times daily.        . Multiple Vitamins-Minerals (CENTRUM SILVER ULTRA MENS) TABS Take 1 tablet by mouth daily.        . pantoprazole (PROTONIX) 40 MG tablet Take 1 tablet (40 mg total) by mouth 2 (two) times  daily.  180 tablet  3  . simvastatin (ZOCOR) 40 MG tablet Take 1 tablet (40 mg total) by mouth at bedtime.  90 tablet  1  . Tamsulosin HCl (FLOMAX) 0.4 MG CAPS Take 1 tablet by mouth Daily.      Marland Kitchen warfarin (COUMADIN) 3 MG tablet Take 1 tablet by mouth as directed           Review of Systems  Constitutional: Positive for activity change, fatigue and unexpected weight change. Negative for fever, chills, diaphoresis and appetite change.  HENT: Positive for voice change. Negative for hearing loss, ear pain, nosebleeds, congestion, sore throat, facial swelling, rhinorrhea, sneezing, mouth sores, trouble swallowing, neck pain, neck stiffness, dental problem, postnasal drip, sinus pressure, tinnitus and ear discharge.   Eyes: Negative for photophobia, discharge, itching and visual disturbance.  Respiratory: Positive for shortness of breath. Negative for apnea, cough, hemoptysis, sputum production, choking, chest tightness, wheezing and stridor.   Cardiovascular: Negative for chest pain, palpitations, orthopnea, leg swelling and PND.  Gastrointestinal: Positive for constipation. Negative for nausea, vomiting, abdominal pain, blood in stool and abdominal distention.  Genitourinary: Positive for dysuria, frequency and difficulty urinating. Negative for urgency, hematuria, flank pain and decreased urine volume.  Musculoskeletal: Negative for myalgias, back pain, joint swelling, arthralgias and gait problem.  Skin: Negative for color change, pallor and rash.  Neurological: Negative for dizziness, tremors, seizures, syncope, speech difficulty, weakness, light-headedness, numbness and headaches.  Hematological: Negative for adenopathy. Does not bruise/bleed easily.  Psychiatric/Behavioral: Negative for confusion, sleep disturbance and agitation. The patient is not nervous/anxious.        Objective:   Physical Exam  Filed Vitals:   10/14/11 1138  BP: 110/70  Pulse: 84  Temp: 97.7 F (36.5 C)    TempSrc: Oral  Height: 5\' 10"  (1.778 m)  Weight: 174 lb (78.926 kg)  SpO2: 94%    Gen: Pleasant, well-nourished, in no distress,  normal affect  ENT: No lesions,  mouth clear,  oropharynx clear, no postnasal drip  Neck: No JVD, no TMG, no carotid bruits  Lungs: No use of accessory muscles, no dullness to percussion, decreased BS 1/4 way up on L  Cardiovascular: RRR, heart sounds normal, no murmur or gallops, 2+ peripheral edema  Abdomen: soft and NT, no HSM,  BS normal  Musculoskeletal: No deformities, no cyanosis or clubbing  Neuro: alert, non focal  Skin: Warm, no lesions or rashes   Dg Chest 2 View  10/14/2011  *RADIOLOGY REPORT*  Clinical Data: Follow up pleural effusion  CHEST - 2 VIEW  Comparison: 09/02/2011  Findings: Stable left lower lobe opacity, likely a combination of atelectasis and pleural effusion.  Mild right basilar atelectasis. No pneumothorax.  Stable cardiomediastinal silhouette.  Left subclavian pacemaker.  Large hiatal hernia.  IMPRESSION: Stable left lower lobe opacity, likely a combination of atelectasis and pleural effusion.  Original Report Authenticated By: Charline Bills, M.D.      Assessment & Plan:   PLEURAL EFFUSION, LEFT Stable L pleural effusion multifactorial Plan Monitor for any change in symptoms No need to retap effusion     Updated Medication List Outpatient Encounter Prescriptions as of 10/14/2011  Medication Sig Dispense Refill  . amiodarone (PACERONE) 200 MG tablet Take 200 mg by mouth daily.        Marland Kitchen aspirin (BAYER LOW STRENGTH) 81 MG EC tablet Take 81 mg by mouth daily.        . digoxin (LANOXIN) 0.125 MG tablet Take 0.5 tablets by mouth Daily.      . Ferrous Sulfate (IRON) 325 (65 FE) MG TABS Take 1 tablet by mouth daily.       . furosemide (LASIX) 20 MG tablet Take 20 mg by mouth 2 (two) times daily. Pt is taking as needed only.      . methimazole (TAPAZOLE) 5 MG tablet Take 5 mg by mouth daily.        . metoprolol  tartrate (LOPRESSOR) 25 MG tablet Take 25 mg by mouth 2 (two) times daily.        . Multiple Vitamins-Minerals (CENTRUM SILVER ULTRA MENS) TABS Take 1 tablet by mouth daily.        . pantoprazole (PROTONIX) 40 MG tablet Take 1 tablet (40 mg total) by mouth 2 (two) times daily.  180 tablet  3  . simvastatin (ZOCOR) 40 MG tablet Take 1 tablet (40 mg total) by mouth at bedtime.  90 tablet  1  . Tamsulosin HCl (FLOMAX) 0.4 MG CAPS Take 1 tablet by mouth Daily.      Marland Kitchen warfarin (COUMADIN) 3 MG tablet Take 1 tablet by mouth as directed

## 2011-10-14 NOTE — Patient Instructions (Signed)
Chest xray today, I will call with results Return as needed, if not much fluid on chest xray return will be as needed No change in medications

## 2011-10-14 NOTE — Progress Notes (Signed)
Quick Note:  Notify the patient that the Xray is stable, left effusion is stable Return to pulmonary as needed  No change in medications are recommended. Continue current meds as prescribed at last office visit ______

## 2011-10-15 NOTE — Assessment & Plan Note (Signed)
Stable L pleural effusion multifactorial Plan Monitor for any change in symptoms No need to retap effusion

## 2011-10-17 ENCOUNTER — Telehealth: Payer: Self-pay | Admitting: Critical Care Medicine

## 2011-10-17 NOTE — Telephone Encounter (Signed)
Pt advised of results. Jennifer Castillo, CMA  

## 2011-11-14 ENCOUNTER — Other Ambulatory Visit: Payer: Self-pay

## 2011-11-14 MED ORDER — SIMVASTATIN 40 MG PO TABS: 40.0000 mg | ORAL_TABLET | Freq: Every day | ORAL | Status: DC

## 2011-11-15 ENCOUNTER — Telehealth: Payer: Self-pay

## 2011-11-15 NOTE — Telephone Encounter (Signed)
Since neither med is new, any effect has already been taken into account

## 2011-11-15 NOTE — Telephone Encounter (Signed)
Pt called stating that according to insert that came with Methimazole, this medication affects Warfarin, Digoxin, and Metoprolol. Pt is concerned as he take all of these medications and is unsure of the potential affects, please advise.

## 2011-11-15 NOTE — Telephone Encounter (Signed)
Left message on machine for pt to return my call  

## 2011-11-16 NOTE — Telephone Encounter (Signed)
Pt informed and understood

## 2011-12-02 ENCOUNTER — Telehealth: Payer: Self-pay

## 2011-12-02 DIAGNOSIS — D500 Iron deficiency anemia secondary to blood loss (chronic): Secondary | ICD-10-CM

## 2011-12-02 NOTE — Telephone Encounter (Signed)
Message copied by Annett Fabian on Fri Dec 02, 2011 10:04 AM ------      Message from: Stan Head E      Created: Thu Dec 01, 2011  5:42 PM      Regarding: cbc       Tell him I was doing chart review and would like him to have a CBC to make sure things are stable re: anemia      (we had decided no more recalls but his old colonoscopy recall came to me).

## 2011-12-02 NOTE — Telephone Encounter (Signed)
Patient advised he will come one day next week

## 2011-12-06 ENCOUNTER — Other Ambulatory Visit (INDEPENDENT_AMBULATORY_CARE_PROVIDER_SITE_OTHER): Payer: Medicare Other

## 2011-12-06 DIAGNOSIS — D500 Iron deficiency anemia secondary to blood loss (chronic): Secondary | ICD-10-CM

## 2011-12-06 LAB — CBC WITH DIFFERENTIAL/PLATELET
Basophils Absolute: 0 10*3/uL (ref 0.0–0.1)
Eosinophils Relative: 1.6 % (ref 0.0–5.0)
Hemoglobin: 11.3 g/dL — ABNORMAL LOW (ref 13.0–17.0)
Lymphocytes Relative: 17.8 % (ref 12.0–46.0)
Monocytes Relative: 11.5 % (ref 3.0–12.0)
Neutro Abs: 3.1 10*3/uL (ref 1.4–7.7)
RBC: 3.86 Mil/uL — ABNORMAL LOW (ref 4.22–5.81)
RDW: 16.3 % — ABNORMAL HIGH (ref 11.5–14.6)
WBC: 4.5 10*3/uL (ref 4.5–10.5)

## 2011-12-06 NOTE — Progress Notes (Signed)
Quick Note:  Mild anemia is unchanged No further testing recommended ______

## 2011-12-25 ENCOUNTER — Encounter (HOSPITAL_COMMUNITY): Payer: Self-pay

## 2011-12-25 ENCOUNTER — Emergency Department (HOSPITAL_COMMUNITY)
Admission: EM | Admit: 2011-12-25 | Discharge: 2011-12-25 | Disposition: A | Discharge status: Home / Self Care | Payer: Medicare Other | Source: Home / Self Care | Attending: Family Medicine | Admitting: Family Medicine

## 2011-12-25 DIAGNOSIS — T148XXA Other injury of unspecified body region, initial encounter: Secondary | ICD-10-CM

## 2011-12-25 DIAGNOSIS — X58XXXA Exposure to other specified factors, initial encounter: Secondary | ICD-10-CM

## 2011-12-25 DIAGNOSIS — IMO0002 Reserved for concepts with insufficient information to code with codable children: Secondary | ICD-10-CM

## 2011-12-25 NOTE — ED Provider Notes (Signed)
History     CSN: [id number]  Arrival date & time 12/25/11  1045   First MD Initiated Contact with Patient 12/25/11 1134      Chief Complaint  Patient presents with  . Puncture Wound    (Consider location/radiation/quality/duration/timing/severity/associated sxs/prior treatment) HPI Comments: [name] [name] presents today for evaluation of a wound over his right anterior lower leg. He reports that he cut his leg on his car door on Tuesday. He did not seek evaluation or care at that time. He dressed and bandaged the wound himself. He presents today for persistent bleeding from the wound. He is on Coumadin for atrial fibrillation. He reports that his last INR was too high and his medication was therefore adjusted. He denies any fever, warmth over the wound or pain.  Patient is a 78 y.o. male presenting with leg pain. The history is provided by the patient.  Leg Pain  The incident occurred more than 2 days ago. The incident occurred at home. The injury mechanism was a direct blow. The pain is present in the right leg. The patient is experiencing no pain. Pertinent negatives include no numbness, no inability to bear weight and no tingling.    Past Medical History  Diagnosis Date  . GASTRIC POLYP 05/13/2009  . COLONIC POLYPS, ADENOMATOUS 01/14/2008  . HYPERLIPIDEMIA 06/01/2007  . ANEMIA, IRON DEFICIENCY 10/13/2008  . COAGULOPATHY, COUMADIN-INDUCED 01/14/2008  . ANXIETY 09/04/2008  . HYPERTENSION 06/01/2007  . CORONARY ARTERY DISEASE 06/01/2007  . ATRIAL FIBRILLATION, CHRONIC 01/14/2008  . UNSPECIFIED VENOUS INSUFFICIENCY 09/18/2008  . PLEURAL EFFUSION, LEFT 10/13/2008  . GERD 06/01/2007  . BARRETTS ESOPHAGUS 03/20/2009  . PROSTATE CANCER, HX OF 01/14/2008  . CATARACTS, BILATERAL, HX OF 01/14/2008  . CEREBROVASCULAR ACCIDENT, HX OF 08/18/2008  . PERSONAL HX COLONIC POLYPS 01/01/2010  . BASAL CELL CARCINOMA OF SKIN SITE UNSPECIFIED 09/28/2010  . HYPERTHYROIDISM 01/06/2011  . Hyperglycemia   . Elevated PSA   .  Restless leg syndrome   . Mitral regurgitation     s/p MVR, bioprosthetic    Past Surgical History  Procedure Date  . Prostatectomy   . Tonsillectomy   . Cystectomy     Left leg  . Pacemaker placement     Adapata  . Coronary artery bypass graft 06/2008  . Mitral valve replacement 06/2008  . Maze procedure 08/09    [facility]  . Cardiac catheterization 11/01/1990  . Skin cancer excision     removed from forehead and right leg, nose/face  . Upper gastrointestinal endoscopy 07/14/2009    erosive reflux esopagitis, Barrett's esophagus  . Colonoscopy 07/14/2009    diverticulosis, internal hemorrhoids    Family History  Problem Relation Age of Onset  . Cancer Mother     Breast Cancer    History  Substance Use Topics  . Smoking status: Former Smoker -- 2 years    Types: Pipe    Quit date: 11/07/1965  . Smokeless tobacco: Never Used   Comment: quit over 40 years ago, never smoked cigarettes  . Alcohol Use: No      Review of Systems  Constitutional: Negative.   HENT: Negative.   Eyes: Negative.   Respiratory: Negative.   Cardiovascular: Negative.   Gastrointestinal: Negative.   Genitourinary: Negative.   Musculoskeletal: Negative.   Skin: Positive for wound.  Neurological: Negative.  Negative for tingling and numbness.  Hematological: Bruises/bleeds easily.    Allergies  Review of patient's allergies indicates no known allergies.  Home Medications   Current Outpatient Rx  Name Route Sig Dispense Refill  . AMIODARONE HCL 200 MG PO TABS Oral Take 200 mg by mouth daily.      . ASPIRIN 81 MG PO TBEC Oral Take 81 mg by mouth daily.      Marland Kitchen DIGOXIN 0.125 MG PO TABS Oral Take 0.5 tablets by mouth Daily.    . IRON 325 (65 FE) MG PO TABS Oral Take 1 tablet by mouth daily.     . FUROSEMIDE 20 MG PO TABS Oral Take 20 mg by mouth 2 (two) times daily. Pt is taking as needed only.    Marland Kitchen METHIMAZOLE 5 MG PO TABS Oral Take 5 mg by mouth daily.      Marland Kitchen METOPROLOL TARTRATE 25 MG PO TABS  Oral Take 25 mg by mouth 2 (two) times daily.      . CENTRUM SILVER ULTRA MENS PO TABS Oral Take 1 tablet by mouth daily.      Marland Kitchen PANTOPRAZOLE SODIUM 40 MG PO TBEC Oral Take 1 tablet (40 mg total) by mouth 2 (two) times daily. 180 tablet 3  . SIMVASTATIN 40 MG PO TABS Oral Take 1 tablet (40 mg total) by mouth at bedtime. 90 tablet 1  . TAMSULOSIN HCL 0.4 MG PO CAPS Oral Take 1 tablet by mouth Daily.    . WARFARIN SODIUM 3 MG PO TABS  Take 1 tablet by mouth as directed       BP 118/80  Pulse 91  Temp(Src) 97.7 F (36.5 C) (Oral)  Resp 17  SpO2 97%  Physical Exam  Nursing note and vitals reviewed. Constitutional: He is oriented to person, place, and time. He appears well-developed and well-nourished.  HENT:  Head: Normocephalic and atraumatic.  Eyes: EOM are normal.  Neck: Normal range of motion.  Pulmonary/Chest: Effort normal.  Musculoskeletal: Normal range of motion.       Legs: Neurological: He is alert and oriented to person, place, and time.  Skin: Skin is warm and dry.     Psychiatric: His behavior is normal.    ED Course  Procedures (including critical care time)  Labs Reviewed - No data to display No results found.   1. Laceration       MDM  Surgical absorbable gelatin sponge applied; pressure dressing; will follow up with me in 48 hours        [name] Priest, MD 12/25/11 1255

## 2011-12-25 NOTE — Discharge Instructions (Signed)
We have applied a special foam that is used to stop bleeding. Leave the dressing, and the foam, in place until you follow up with [place]. Return sooner should your symptoms worsen in any way such as increased pain, warmth, purulent drainage, or numbness, tingling, or weakness.

## 2011-12-25 NOTE — ED Notes (Signed)
Pt hit rt shin on car door on Tuesday and has skin tear and bruising, pt on coumadin for atrial fib.

## 2011-12-27 ENCOUNTER — Encounter (HOSPITAL_COMMUNITY): Payer: Self-pay | Admitting: Emergency Medicine

## 2011-12-27 ENCOUNTER — Emergency Department (INDEPENDENT_AMBULATORY_CARE_PROVIDER_SITE_OTHER)
Admission: EM | Admit: 2011-12-27 | Discharge: 2011-12-27 | Disposition: A | Discharge status: Home Health | Payer: Medicare Other | Source: Home / Self Care | Attending: Family Medicine | Admitting: Family Medicine

## 2011-12-27 DIAGNOSIS — IMO0002 Reserved for concepts with insufficient information to code with codable children: Secondary | ICD-10-CM

## 2011-12-27 DIAGNOSIS — X58XXXA Exposure to other specified factors, initial encounter: Secondary | ICD-10-CM

## 2011-12-27 DIAGNOSIS — T148XXA Other injury of unspecified body region, initial encounter: Secondary | ICD-10-CM

## 2011-12-27 NOTE — ED Provider Notes (Signed)
History     CSN: [id number]  Arrival date & time 12/27/11  1308   First MD Initiated Contact with Patient 12/27/11 0830      Chief Complaint  Patient presents with  . Wound Check    (Consider location/radiation/quality/duration/timing/severity/associated sxs/prior treatment) HPI Comments: [name] [name] presents for evaluation for wound recheck. [name] [name] was seen here 2 days ago for laceration on his right lower leg. [name] [name] struck his leg on a car door several days before presentation. [name] [name] presented to care for concern of persistent bleeding as [name] [name] is on blood thinners. [name] [name] takes Coumadin for atrial fibrillation. [name] [name] eventually wound himself at a controlled most of the bleeding except for one small area. Gelfoam was applied with a pressure dressing and [name] [name] was advised to come back today. [name] [name] denies any other problems.  Patient is a 78 y.o. male presenting with wound check. The history is provided by the patient.  Wound Check  [name] [name] was treated in the ED 2 to 3 days ago. Previous treatment in the ED includes laceration repair. Treatments tried: gelfoam and pressure dressing. There has been no drainage from the wound. The redness has not changed. The swelling has not changed. The pain has not changed.    Past Medical History  Diagnosis Date  . GASTRIC POLYP 05/13/2009  . COLONIC POLYPS, ADENOMATOUS 01/14/2008  . HYPERLIPIDEMIA 06/01/2007  . ANEMIA, IRON DEFICIENCY 10/13/2008  . COAGULOPATHY, COUMADIN-INDUCED 01/14/2008  . ANXIETY 09/04/2008  . HYPERTENSION 06/01/2007  . CORONARY ARTERY DISEASE 06/01/2007  . ATRIAL FIBRILLATION, CHRONIC 01/14/2008  . UNSPECIFIED VENOUS INSUFFICIENCY 09/18/2008  . PLEURAL EFFUSION, LEFT 10/13/2008  . GERD 06/01/2007  . BARRETTS ESOPHAGUS 03/20/2009  . PROSTATE CANCER, HX OF 01/14/2008  . CATARACTS, BILATERAL, HX OF 01/14/2008  . CEREBROVASCULAR ACCIDENT, HX OF 08/18/2008  . PERSONAL HX COLONIC POLYPS 01/01/2010  . BASAL CELL CARCINOMA OF SKIN SITE UNSPECIFIED 09/28/2010  . HYPERTHYROIDISM  01/06/2011  . Hyperglycemia   . Elevated PSA   . Restless leg syndrome   . Mitral regurgitation     s/p MVR, bioprosthetic    Past Surgical History  Procedure Date  . Prostatectomy   . Tonsillectomy   . Cystectomy     Left leg  . Pacemaker placement     Adapata  . Coronary artery bypass graft 06/2008  . Mitral valve replacement 06/2008  . Maze procedure 08/09    [facility]  . Cardiac catheterization 11/01/1990  . Skin cancer excision     removed from forehead and right leg, nose/face  . Upper gastrointestinal endoscopy 07/14/2009    erosive reflux esopagitis, Barrett's esophagus  . Colonoscopy 07/14/2009    diverticulosis, internal hemorrhoids    Family History  Problem Relation Age of Onset  . Cancer Mother     Breast Cancer    History  Substance Use Topics  . Smoking status: Former Smoker -- 2 years    Types: Pipe    Quit date: 11/07/1965  . Smokeless tobacco: Never Used   Comment: quit over 40 years ago, never smoked cigarettes  . Alcohol Use: No      Review of Systems  Constitutional: Negative.   HENT: Negative.   Eyes: Negative.   Respiratory: Negative.   Cardiovascular: Negative.   Gastrointestinal: Negative.   Genitourinary: Negative.   Musculoskeletal: Negative.   Skin: Positive for wound.  Neurological: Negative.     Allergies  Review of patient's allergies indicates no known allergies.  Home Medications   Current Outpatient Rx  Name Route  Sig Dispense Refill  . AMIODARONE HCL 200 MG PO TABS Oral Take 200 mg by mouth daily.      . ASPIRIN 81 MG PO TBEC Oral Take 81 mg by mouth daily.      Marland Kitchen DIGOXIN 0.125 MG PO TABS Oral Take 0.5 tablets by mouth Daily.    . IRON 325 (65 FE) MG PO TABS Oral Take 1 tablet by mouth daily.     . FUROSEMIDE 20 MG PO TABS Oral Take 20 mg by mouth 2 (two) times daily. Pt is taking as needed only.    Marland Kitchen METHIMAZOLE 5 MG PO TABS Oral Take 5 mg by mouth daily.      Marland Kitchen METOPROLOL TARTRATE 25 MG PO TABS Oral Take 25 mg by mouth 2  (two) times daily.      . CENTRUM SILVER ULTRA MENS PO TABS Oral Take 1 tablet by mouth daily.      Marland Kitchen PANTOPRAZOLE SODIUM 40 MG PO TBEC Oral Take 1 tablet (40 mg total) by mouth 2 (two) times daily. 180 tablet 3  . SIMVASTATIN 40 MG PO TABS Oral Take 1 tablet (40 mg total) by mouth at bedtime. 90 tablet 1  . TAMSULOSIN HCL 0.4 MG PO CAPS Oral Take 1 tablet by mouth Daily.    . WARFARIN SODIUM 3 MG PO TABS  Take 1 tablet by mouth as directed       BP 113/67  Pulse 84  Temp(Src) 97.6 F (36.4 C) (Oral)  Resp 18  SpO2 95%  Physical Exam  Nursing note and vitals reviewed. Constitutional: [name] [name] is oriented to person, place, and time. [name] [name] appears well-developed and well-nourished.  HENT:  Head: Normocephalic and atraumatic.  Eyes: EOM are normal.  Neck: Normal range of motion.  Pulmonary/Chest: Effort normal.  Musculoskeletal: Normal range of motion.  Neurological: [name] [name] is alert and oriented to person, place, and time.  Skin: Skin is warm and dry.          Hemostatic V shaped laceration over lateral RIGHT lower leg; gelfoam removed, new non-stick dressing applied  Psychiatric: His behavior is normal.    ED Course  Procedures (including critical care time)  Labs Reviewed - No data to display No results found.   1. Laceration       MDM  Bleeding controlled; new nonstick loose dressing applied        Richardo Priest, MD 12/27/11 [phone]

## 2011-12-27 NOTE — ED Notes (Signed)
Wound recheck to right lower leg.  Seen Sunday 12/24/2010.

## 2011-12-27 NOTE — Discharge Instructions (Signed)
Keep wound clean, dry, and covered. Change dressing once daily. Monitor for signs of infection, such as increasing redness, warmth, purulent drainage or increased pain. Return to care. Should any of these symptoms present. Keep leg elevated when not walking.

## 2012-03-19 ENCOUNTER — Other Ambulatory Visit: Payer: Self-pay

## 2012-03-19 MED ORDER — METHIMAZOLE 5 MG PO TABS: 5.0000 mg | ORAL_TABLET | Freq: Every day | ORAL | Status: DC

## 2012-03-19 MED ORDER — SIMVASTATIN 40 MG PO TABS: 40.0000 mg | ORAL_TABLET | Freq: Every day | ORAL | Status: DC

## 2012-04-17 ENCOUNTER — Other Ambulatory Visit: Payer: Self-pay

## 2012-04-17 MED ORDER — SIMVASTATIN 40 MG PO TABS: 40.0000 mg | ORAL_TABLET | Freq: Every day | ORAL | Status: DC

## 2012-07-03 ENCOUNTER — Telehealth: Payer: Self-pay | Admitting: *Deleted

## 2012-07-03 MED ORDER — SIMVASTATIN 40 MG PO TABS
40.0000 mg | ORAL_TABLET | Freq: Every day | ORAL | Status: DC
Start: 2012-07-03 — End: 2012-09-24

## 2012-07-03 NOTE — Telephone Encounter (Signed)
Left msg on triage need new rx for simvastatin [facility]...Raechel Chute

## 2012-07-03 NOTE — Telephone Encounter (Signed)
Called pt back no answer LMOM rx sent [facility]...Raechel Chute

## 2012-07-24 ENCOUNTER — Ambulatory Visit (INDEPENDENT_AMBULATORY_CARE_PROVIDER_SITE_OTHER): Payer: Medicare Other

## 2012-07-24 DIAGNOSIS — Z23 Encounter for immunization: Secondary | ICD-10-CM

## 2012-08-06 ENCOUNTER — Other Ambulatory Visit: Payer: Self-pay | Admitting: Ophthalmology

## 2012-08-10 ENCOUNTER — Encounter: Payer: Self-pay | Admitting: Internal Medicine

## 2012-08-17 ENCOUNTER — Encounter: Payer: Self-pay | Admitting: Endocrinology

## 2012-08-17 ENCOUNTER — Ambulatory Visit (INDEPENDENT_AMBULATORY_CARE_PROVIDER_SITE_OTHER): Payer: Medicare Other | Admitting: Endocrinology

## 2012-08-17 VITALS — BP 124/78 | HR 86 | Temp 97.7°F | Wt 175.0 lb

## 2012-08-17 DIAGNOSIS — E785 Hyperlipidemia, unspecified: Secondary | ICD-10-CM

## 2012-08-17 DIAGNOSIS — Z79899 Other long term (current) drug therapy: Secondary | ICD-10-CM

## 2012-08-17 DIAGNOSIS — E0590 Thyrotoxicosis, unspecified without thyrotoxic crisis or storm: Secondary | ICD-10-CM

## 2012-08-17 DIAGNOSIS — I4891 Unspecified atrial fibrillation: Secondary | ICD-10-CM

## 2012-08-17 DIAGNOSIS — D509 Iron deficiency anemia, unspecified: Secondary | ICD-10-CM

## 2012-08-17 DIAGNOSIS — Z0000 Encounter for general adult medical examination without abnormal findings: Secondary | ICD-10-CM

## 2012-08-17 DIAGNOSIS — I10 Essential (primary) hypertension: Secondary | ICD-10-CM

## 2012-08-17 LAB — HEPATIC FUNCTION PANEL
AST: 20 U/L (ref 0–37)
Alkaline Phosphatase: 87 U/L (ref 39–117)
Indirect Bilirubin: 0.4 mg/dL (ref 0.0–0.9)
Total Bilirubin: 0.6 mg/dL (ref 0.3–1.2)
Total Protein: 6.1 g/dL (ref 6.0–8.3)

## 2012-08-17 LAB — BASIC METABOLIC PANEL
CO2: 31 mEq/L (ref 19–32)
Calcium: 8.9 mg/dL (ref 8.4–10.5)
Chloride: 109 mEq/L (ref 96–112)
Creat: 0.83 mg/dL (ref 0.50–1.35)
Glucose, Bld: 71 mg/dL (ref 70–99)

## 2012-08-17 LAB — CBC WITH DIFFERENTIAL/PLATELET
Basophils Absolute: 0 10*3/uL (ref 0.0–0.1)
Basophils Relative: 1 % (ref 0–1)
Eosinophils Absolute: 0.1 10*3/uL (ref 0.0–0.7)
Eosinophils Relative: 2 % (ref 0–5)
HCT: 36.2 % — ABNORMAL LOW (ref 39.0–52.0)
Hemoglobin: 12.4 g/dL — ABNORMAL LOW (ref 13.0–17.0)
Lymphs Abs: 1.1 10*3/uL (ref 0.7–4.0)
MCV: 90 fL (ref 78.0–100.0)
Neutrophils Relative %: 67 % (ref 43–77)
Platelets: 160 10*3/uL (ref 150–400)
WBC: 5.6 10*3/uL (ref 4.0–10.5)

## 2012-08-17 LAB — LIPID PANEL
HDL: 37 mg/dL — ABNORMAL LOW (ref >39)
Triglycerides: 59 mg/dL (ref <150)

## 2012-08-17 LAB — IBC PANEL: %SAT: 25 % (ref 20–55)

## 2012-08-17 NOTE — Patient Instructions (Addendum)
blood tests are being requested for you today.  You will be contacted with results. please consider these measures for your health:  minimize alcohol.  do not use tobacco products.  have a colonoscopy at least every 10 years from age 78.  keep firearms safely stored.  always use seat belts.  have working smoke alarms in your home.  see an eye doctor and dentist regularly.  never drive under the influence of alcohol or drugs (including prescription drugs).  those with fair skin should take precautions against the sun. please let me know what your wishes would be, if artificial life support measures should become necessary.  it is critically important to prevent falling down (keep floor areas well-lit, dry, and free of loose objects.  If you have a cane, walker, or wheelchair, you should use it, even for short trips around the house.  Also, try not to rush). You should have a vaccine against shingles (a painful rash which results from the  chickenpox infection which most people had many years ago).  This vaccine reduces, but does not totally eliminate the risk of shingles.  Because this is a medicare part d benefit, you should get it at a pharmacy.   Please come back for a follow-up appointment in 6 months. (update: we discussed code status.  pt requests full code, but would not want to be started or maintained on artificial life-support measures if there was not a reasonable chance of recovery)

## 2012-08-17 NOTE — Progress Notes (Signed)
  Subjective:    Patient ID: [name] [name], male    DOB: Aug 18, 1929, 78 y.o.   MRN: [id number]  HPI    Review of Systems  Constitutional: Negative for unexpected weight change.  HENT: Negative for hearing loss.   Eyes: Negative for visual disturbance.  Respiratory: Negative for shortness of breath.   Cardiovascular: Negative for chest pain.  Gastrointestinal: Negative for abdominal pain.  Musculoskeletal: Negative for back pain.  Skin: Negative for rash.  Neurological: Negative for syncope.  Hematological: Bruises/bleeds easily.  Psychiatric/Behavioral: Negative for dysphoric mood.       Objective:   Physical Exam VS: see vs page GEN: no distress HEAD: head: no deformity eyes: no periorbital swelling, no proptosis external nose and ears are normal mouth: no lesion seen NECK: supple, thyroid is not enlarged CHEST WALL: no deformity.  Old healed surgical scar (median sternotomy), and pacemaker present. LUNGS: clear to auscultation BREASTS:  No gynecomastia CV: reg rate and rhythm.  Systolic murmur GENITALIA/RECTAL/PROSTATE:  sees urology MUSCULOSKELETAL: muscle bulk and strength are grossly normal.  no obvious joint swelling.  gait is normal and steady; kyphosis EXTEMITIES: no deformity.  no ulcer on the feet.  feet are of normal color and temp.  no edema.  There is bilateral onychomycosis, rust colored skin, and varicosities.   PULSES: dorsalis pedis intact bilat, but decreased from normal.  no carotid bruit.   SKIN:  Normal texture and temperature.  No rash or suspicious lesion is visible.  Multiple ecchymoses. NODES:  None palpable at the neck PSYCH: alert, oriented x3.  Does not appear anxious nor depressed.     Assessment & Plan:  Wellness visit today, with problems stable, except as noted.    SEPARATE EVALUATION FOLLOWS--EACH PROBLEM HERE IS NEW, NOT RESPONDING TO TREATMENT, OR POSES SIGNIFICANT RISK TO THE PATIENT'S HEALTH: HISTORY OF THE PRESENT ILLNESS: The  state of at least three ongoing medical problems is addressed today: Hyperthyroidism: he takes tapazole as rx'ed.  Denies fever Anemia: denies hematuria Abnormal ua: denies urinary retention PAST MEDICAL HISTORY reviewed and up to date today REVIEW OF SYSTEMS: Denies falls and brbpr PHYSICAL EXAMINATION: VITAL SIGNS:  See vs page GENERAL: no distress ABD: abdomen is soft, nontender.  no hepatosplenomegaly.  not distended.  no hernia NEURO:  cn 2-12 grossly intact.   readily moves all 4's.  sensation is intact to touch on the feet.  no tremor LAB/XRAY RESULTS: (i reviewed [place] results) Lab Results  Component Value Date   WBC 5.6 08/17/2012   HGB 12.4* 08/17/2012   HCT 36.2* 08/17/2012   PLT 160 08/17/2012   GLUCOSE 71 08/17/2012   CHOL 122 08/17/2012   TRIG 59 08/17/2012   HDL 37* 08/17/2012   LDLCALC 73 08/17/2012   ALT 16 08/17/2012   AST 20 08/17/2012   NA 142 08/17/2012   K 4.7 08/17/2012   CL 109 08/17/2012   CREATININE 0.83 08/17/2012   BUN 15 08/17/2012   CO2 31 08/17/2012   TSH 12.822* 08/17/2012   PSA 0.00 Repeated and verified X2. ng/mL* 01/01/2010   INR 1.59* 08/03/2011  IMPRESSION: hyperthyroidism, overcontrolled mild anemia, which persists despite normal iron level Abnormal ua, uncertain etiology PLAN: See instruction page

## 2012-08-18 LAB — URINALYSIS, ROUTINE W REFLEX MICROSCOPIC
Nitrite: NEGATIVE
Specific Gravity, Urine: 1.023 (ref 1.005–1.030)
pH: 5.5 (ref 5.0–8.0)

## 2012-08-18 LAB — URINALYSIS, MICROSCOPIC ONLY: Bacteria, UA: NONE SEEN

## 2012-08-24 ENCOUNTER — Encounter: Payer: Self-pay | Admitting: Endocrinology

## 2012-09-24 ENCOUNTER — Other Ambulatory Visit: Payer: Self-pay

## 2012-09-24 MED ORDER — SIMVASTATIN 40 MG PO TABS: 40.0000 mg | ORAL_TABLET | Freq: Every day | ORAL | Status: DC

## 2012-09-24 MED ORDER — METHIMAZOLE 5 MG PO TABS: 5.0000 mg | ORAL_TABLET | Freq: Once | ORAL | Status: DC

## 2012-10-08 ENCOUNTER — Other Ambulatory Visit: Payer: Self-pay

## 2012-10-08 MED ORDER — PANTOPRAZOLE SODIUM 40 MG PO TBEC: 40.0000 mg | DELAYED_RELEASE_TABLET | Freq: Two times a day (BID) | ORAL | Status: DC

## 2012-10-08 NOTE — Telephone Encounter (Signed)
Form for Pantoprazole faxed back to [phone].  [facility].

## 2012-11-01 ENCOUNTER — Encounter (HOSPITAL_COMMUNITY): Payer: Self-pay | Admitting: *Deleted

## 2012-11-01 ENCOUNTER — Emergency Department (HOSPITAL_COMMUNITY)
Admission: EM | Admit: 2012-11-01 | Discharge: 2012-11-01 | Disposition: A | Discharge status: Home / Self Care | Payer: Medicare Other | Attending: Emergency Medicine | Admitting: Emergency Medicine

## 2012-11-01 ENCOUNTER — Emergency Department (HOSPITAL_COMMUNITY): Payer: Medicare Other

## 2012-11-01 DIAGNOSIS — I4891 Unspecified atrial fibrillation: Secondary | ICD-10-CM | POA: Insufficient documentation

## 2012-11-01 DIAGNOSIS — S298XXA Other specified injuries of thorax, initial encounter: Secondary | ICD-10-CM | POA: Insufficient documentation

## 2012-11-01 DIAGNOSIS — Z79899 Other long term (current) drug therapy: Secondary | ICD-10-CM | POA: Insufficient documentation

## 2012-11-01 DIAGNOSIS — Z8669 Personal history of other diseases of the nervous system and sense organs: Secondary | ICD-10-CM | POA: Insufficient documentation

## 2012-11-01 DIAGNOSIS — Z8639 Personal history of other endocrine, nutritional and metabolic disease: Secondary | ICD-10-CM | POA: Insufficient documentation

## 2012-11-01 DIAGNOSIS — Z95810 Presence of automatic (implantable) cardiac defibrillator: Secondary | ICD-10-CM | POA: Insufficient documentation

## 2012-11-01 DIAGNOSIS — W108XXA Fall (on) (from) other stairs and steps, initial encounter: Secondary | ICD-10-CM | POA: Insufficient documentation

## 2012-11-01 DIAGNOSIS — E785 Hyperlipidemia, unspecified: Secondary | ICD-10-CM | POA: Insufficient documentation

## 2012-11-01 DIAGNOSIS — Z7982 Long term (current) use of aspirin: Secondary | ICD-10-CM | POA: Insufficient documentation

## 2012-11-01 DIAGNOSIS — K219 Gastro-esophageal reflux disease without esophagitis: Secondary | ICD-10-CM | POA: Insufficient documentation

## 2012-11-01 DIAGNOSIS — R0789 Other chest pain: Secondary | ICD-10-CM

## 2012-11-01 DIAGNOSIS — I2510 Atherosclerotic heart disease of native coronary artery without angina pectoris: Secondary | ICD-10-CM | POA: Insufficient documentation

## 2012-11-01 DIAGNOSIS — Z862 Personal history of diseases of the blood and blood-forming organs and certain disorders involving the immune mechanism: Secondary | ICD-10-CM | POA: Insufficient documentation

## 2012-11-01 DIAGNOSIS — Z8719 Personal history of other diseases of the digestive system: Secondary | ICD-10-CM | POA: Insufficient documentation

## 2012-11-01 DIAGNOSIS — Z860100 Personal history of colon polyps, unspecified: Secondary | ICD-10-CM | POA: Insufficient documentation

## 2012-11-01 DIAGNOSIS — Z8546 Personal history of malignant neoplasm of prostate: Secondary | ICD-10-CM | POA: Insufficient documentation

## 2012-11-01 DIAGNOSIS — Z85828 Personal history of other malignant neoplasm of skin: Secondary | ICD-10-CM | POA: Insufficient documentation

## 2012-11-01 DIAGNOSIS — Y939 Activity, unspecified: Secondary | ICD-10-CM | POA: Insufficient documentation

## 2012-11-01 DIAGNOSIS — Z8679 Personal history of other diseases of the circulatory system: Secondary | ICD-10-CM | POA: Insufficient documentation

## 2012-11-01 DIAGNOSIS — D509 Iron deficiency anemia, unspecified: Secondary | ICD-10-CM | POA: Insufficient documentation

## 2012-11-01 DIAGNOSIS — Z8673 Personal history of transient ischemic attack (TIA), and cerebral infarction without residual deficits: Secondary | ICD-10-CM | POA: Insufficient documentation

## 2012-11-01 DIAGNOSIS — Y929 Unspecified place or not applicable: Secondary | ICD-10-CM | POA: Insufficient documentation

## 2012-11-01 DIAGNOSIS — T148XXA Other injury of unspecified body region, initial encounter: Secondary | ICD-10-CM | POA: Insufficient documentation

## 2012-11-01 DIAGNOSIS — Z8709 Personal history of other diseases of the respiratory system: Secondary | ICD-10-CM | POA: Insufficient documentation

## 2012-11-01 DIAGNOSIS — Z951 Presence of aortocoronary bypass graft: Secondary | ICD-10-CM | POA: Insufficient documentation

## 2012-11-01 DIAGNOSIS — Z87891 Personal history of nicotine dependence: Secondary | ICD-10-CM | POA: Insufficient documentation

## 2012-11-01 DIAGNOSIS — Z7901 Long term (current) use of anticoagulants: Secondary | ICD-10-CM | POA: Insufficient documentation

## 2012-11-01 DIAGNOSIS — I10 Essential (primary) hypertension: Secondary | ICD-10-CM | POA: Insufficient documentation

## 2012-11-01 MED ORDER — HYDROCODONE-ACETAMINOPHEN 5-325 MG PO TABS: 1.0000 | ORAL_TABLET | ORAL | Status: DC | PRN

## 2012-11-01 MED ORDER — METHOCARBAMOL 500 MG PO TABS: 500.0000 mg | ORAL_TABLET | Freq: Two times a day (BID) | ORAL | Status: DC

## 2012-11-01 NOTE — ED Notes (Addendum)
Pt reports falling down 13 stairs on Christmas Eve.  Pt remembers falling, denies hitting head or LOC.  Pt reports mid back pain that hurts worse with deep inspiration and turning a certain way.  Pt reports that he thinks he has a broken rib.  Pt ambulatory without difficulty.  NAD noted.  A/O x 4.  Pts back is not bruised or swollen or deformity noted.

## 2012-11-01 NOTE — ED Notes (Signed)
Pt. returned from XR. 

## 2012-11-01 NOTE — ED Notes (Signed)
Patient transported to X-ray 

## 2012-11-01 NOTE — ED Notes (Signed)
Pt wife, Jeanice Lim, went home. Requested to call her if any changes/Discharge. [phone]

## 2012-11-01 NOTE — ED Provider Notes (Signed)
History     CSN: [id number]  Arrival date & time 11/01/12  9562   First MD Initiated Contact with Patient 11/01/12 2226      Chief Complaint  Patient presents with  . Back Pain    (Consider location/radiation/quality/duration/timing/severity/associated sxs/prior treatment) HPI Comments: Pt comes in with cc of fall and back pain. Pt had a fall on Christmas eve, as he just slid down the stairs. He fell on to his right side - and started having focal pain the day after. The pain is mild to moderate, worse with inspiration and with palpation in that region.  Pt has no midline spine tenderness and no  numbness, weakness, urinary incontinence, urinary retention, bowel incontinence, weakness.   Patient is a 78 y.o. male presenting with back pain. The history is provided by the patient.  Back Pain  Associated symptoms include chest pain. Pertinent negatives include no abdominal pain and no dysuria.    Past Medical History  Diagnosis Date  . GASTRIC POLYP 05/13/2009  . COLONIC POLYPS, ADENOMATOUS 01/14/2008  . HYPERLIPIDEMIA 06/01/2007  . ANEMIA, IRON DEFICIENCY 10/13/2008  . COAGULOPATHY, COUMADIN-INDUCED 01/14/2008  . ANXIETY 09/04/2008  . HYPERTENSION 06/01/2007  . CORONARY ARTERY DISEASE 06/01/2007  . ATRIAL FIBRILLATION, CHRONIC 01/14/2008  . UNSPECIFIED VENOUS INSUFFICIENCY 09/18/2008  . PLEURAL EFFUSION, LEFT 10/13/2008  . GERD 06/01/2007  . BARRETTS ESOPHAGUS 03/20/2009  . PROSTATE CANCER, HX OF 01/14/2008  . CATARACTS, BILATERAL, HX OF 01/14/2008  . CEREBROVASCULAR ACCIDENT, HX OF 08/18/2008  . PERSONAL HX COLONIC POLYPS 01/01/2010  . BASAL CELL CARCINOMA OF SKIN SITE UNSPECIFIED 09/28/2010  . HYPERTHYROIDISM 01/06/2011  . Hyperglycemia   . Elevated PSA   . Restless leg syndrome   . Mitral regurgitation     s/p MVR, bioprosthetic    Past Surgical History  Procedure Date  . Prostatectomy   . Tonsillectomy   . Cystectomy     Left leg  . Pacemaker placement     Adapata  .  Coronary artery bypass graft 06/2008  . Mitral valve replacement 06/2008  . Maze procedure 08/09    [facility]  . Cardiac catheterization 11/01/1990  . Skin cancer excision     removed from forehead and right leg, nose/face  . Upper gastrointestinal endoscopy 07/14/2009    erosive reflux esopagitis, Barrett's esophagus  . Colonoscopy 07/14/2009    diverticulosis, internal hemorrhoids    Family History  Problem Relation Age of Onset  . Cancer Mother     Breast Cancer    History  Substance Use Topics  . Smoking status: Former Smoker -- 2 years    Types: Pipe    Quit date: 11/07/1965  . Smokeless tobacco: Never Used     Comment: quit over 40 years ago, never smoked cigarettes  . Alcohol Use: No      Review of Systems  Constitutional: Negative for activity change and appetite change.  Respiratory: Negative for cough and shortness of breath.   Cardiovascular: Positive for chest pain.  Gastrointestinal: Negative for abdominal pain.  Genitourinary: Negative for dysuria.  Musculoskeletal: Positive for back pain.  Hematological: Bruises/bleeds easily.    Allergies  Review of patient's allergies indicates no known allergies.  Home Medications   Current Outpatient Rx  Name  Route  Sig  Dispense  Refill  . AMIODARONE HCL 200 MG PO TABS   Oral   Take 200 mg by mouth daily.          . ASPIRIN EC 81 MG PO TBEC  Oral   Take 81 mg by mouth daily.         Marland Kitchen DIGOXIN 0.125 MG PO TABS   Oral   Take 0.125 mg by mouth daily.         Marland Kitchen FERROUS SULFATE 325 (65 FE) MG PO TABS   Oral   Take 325 mg by mouth daily with breakfast.         . METHIMAZOLE 5 MG PO TABS   Oral   Take 5 mg by mouth 3 (three) times a week. On Monday, Wednesday and Friday         . METOPROLOL TARTRATE 50 MG PO TABS   Oral   Take 50 mg by mouth 2 (two) times daily.         . ADULT MULTIVITAMIN W/MINERALS CH   Oral   Take 1 tablet by mouth daily. Centrum Silver         . PANTOPRAZOLE SODIUM 40  MG PO TBEC   Oral   Take 1 tablet (40 mg total) by mouth 2 (two) times daily.   180 tablet   3     Faxed to [phone]   . SIMVASTATIN 40 MG PO TABS   Oral   Take 1 tablet (40 mg total) by mouth at bedtime.   90 tablet   0     Yearly physical due in October no additional refil ...   . TAMSULOSIN HCL 0.4 MG PO CAPS   Oral   Take by mouth every evening.          . WARFARIN SODIUM 3 MG PO TABS   Oral   Take 3-4.5 mg by mouth every evening. 1 1/2 tabs (4.5 mg) on Monday, Wednesday and Friday, 1 tab (3 mg) on Tuesday, Thursday, Saturday and Sunday           BP 130/75  Pulse 72  Temp 98 F (36.7 C) (Oral)  Resp 20  SpO2 95%  Physical Exam  Nursing note and vitals reviewed. Constitutional: He is oriented to person, place, and time. He appears well-developed.  HENT:  Head: Normocephalic and atraumatic.  Eyes: Conjunctivae normal and EOM are normal. Pupils are equal, round, and reactive to light.  Neck: Normal range of motion. Neck supple.  Cardiovascular: Normal rate and regular rhythm.   Pulmonary/Chest: Effort normal and breath sounds normal.  Abdominal: Soft. Bowel sounds are normal. He exhibits no distension. There is no tenderness. There is no rebound and no guarding.  Musculoskeletal:       Focal tenderness lower thoracic rib on the right side. No spine tenderness at any point.  Neurological: He is alert and oriented to person, place, and time.  Skin: Skin is warm.    ED Course  Procedures (including critical care time)  Labs Reviewed - No data to display Dg Ribs Unilateral W/chest Right  11/01/2012  *RADIOLOGY REPORT*  Clinical Data: 78 year old male with chest pain and right rib pain following fall.  RIGHT RIBS AND CHEST - 3+ VIEW  Comparison: 11/29/2011 abdominal CT.  10/14/2011 and multiple chest radiographs dating back to 08/03/2004.  Findings: Cardiomegaly, median sternotomy wires, and a left-sided pacemaker again noted. A very large hiatal hernia  containing stomach and colon is again noted. There is no evidence of focal airspace disease, pulmonary edema, suspicious pulmonary nodule/mass, pleural effusion, or pneumothorax. No acute bony abnormalities are identified. No definite right rib abnormality or acute fracture is noted.  IMPRESSION: No evidence of acute abnormality -  no evidence of definite right rib fracture.  Cardiomegaly and very large hiatal hernia containing stomach and colon.   Original Report Authenticated By: Harmon Pier, M.D.    Dg Ankle Complete Left  11/01/2012  *RADIOLOGY REPORT*  Clinical Data: Status post fall down steps 2 days ago; left ankle pain.  LEFT ANKLE COMPLETE - 3+ VIEW  Comparison: Left tibia / fibula radiographs performed 09/14/2008  Findings: There is no evidence of fracture or dislocation.  A tiny osseous fragment adjacent to the distal fibula may be degenerative in nature.  The ankle mortise is intact; the interosseous space is within normal limits.  No talar tilt or subluxation is seen.  The joint spaces are preserved.  Diffuse vascular calcifications are noted.  IMPRESSION:  1.  No evidence of fracture or dislocation. 2.  Diffuse vascular calcifications seen.   Original Report Authenticated By: Tonia Ghent, M.D.      No diagnosis found.    MDM  Pt comes in with cc of focal back pain from a fall. Given he is on coumadin, we considered cord compresion from hematoma - however, he has no spine tenderness and hx and exam not suggestive of cord involvement. We will get focal rib films - if negative, likely contusion and chest wall tenderness. Ankle pain as well - but ambulating- and ankle films are negative.   Derwood Kaplan, MD 11/01/12 2329

## 2012-11-01 NOTE — ED Notes (Signed)
Pt given warm blanket.

## 2012-11-19 ENCOUNTER — Telehealth: Payer: Self-pay | Admitting: Endocrinology

## 2012-11-19 NOTE — Telephone Encounter (Signed)
Pt advised he needs to keep his appointment on 11/22/12 and can get refills on meds once he keeps his appt.

## 2012-11-19 NOTE — Telephone Encounter (Signed)
The patient called to request refill of Simvastatin 40mg .  The patient requests a 3 month supply sent to [facility] -mail order pharmacy.  The patient may be reached at [phone] if needed.

## 2012-11-22 ENCOUNTER — Ambulatory Visit (INDEPENDENT_AMBULATORY_CARE_PROVIDER_SITE_OTHER): Payer: Medicare Other | Admitting: Endocrinology

## 2012-11-22 VITALS — BP 124/68 | HR 87 | Temp 97.8°F | Wt 170.0 lb

## 2012-11-22 DIAGNOSIS — E0590 Thyrotoxicosis, unspecified without thyrotoxic crisis or storm: Secondary | ICD-10-CM

## 2012-11-22 LAB — TSH: TSH: 4.804 u[IU]/mL — ABNORMAL HIGH (ref 0.350–4.500)

## 2012-11-22 NOTE — Progress Notes (Signed)
Subjective:    Patient ID: [name] [name], male    DOB: Jun 16, 1929, 79 y.o.   MRN: [id number]  HPI Pt returns for f/u of hyperthyroidism (dx'ed 2012; tapazole rx was chosen for prompt normalization, due to AF; cause is uncertain).  He feels no different since synthroid was reduced.   Past Medical History  Diagnosis Date  . GASTRIC POLYP 05/13/2009  . COLONIC POLYPS, ADENOMATOUS 01/14/2008  . HYPERLIPIDEMIA 06/01/2007  . ANEMIA, IRON DEFICIENCY 10/13/2008  . COAGULOPATHY, COUMADIN-INDUCED 01/14/2008  . ANXIETY 09/04/2008  . HYPERTENSION 06/01/2007  . CORONARY ARTERY DISEASE 06/01/2007  . ATRIAL FIBRILLATION, CHRONIC 01/14/2008  . UNSPECIFIED VENOUS INSUFFICIENCY 09/18/2008  . PLEURAL EFFUSION, LEFT 10/13/2008  . GERD 06/01/2007  . BARRETTS ESOPHAGUS 03/20/2009  . PROSTATE CANCER, HX OF 01/14/2008  . CATARACTS, BILATERAL, HX OF 01/14/2008  . CEREBROVASCULAR ACCIDENT, HX OF 08/18/2008  . PERSONAL HX COLONIC POLYPS 01/01/2010  . BASAL CELL CARCINOMA OF SKIN SITE UNSPECIFIED 09/28/2010  . HYPERTHYROIDISM 01/06/2011  . Hyperglycemia   . Elevated PSA   . Restless leg syndrome   . Mitral regurgitation     s/p MVR, bioprosthetic    Past Surgical History  Procedure Date  . Prostatectomy   . Tonsillectomy   . Cystectomy     Left leg  . Pacemaker placement     Adapata  . Coronary artery bypass graft 06/2008  . Mitral valve replacement 06/2008  . Maze procedure 08/09    [facility]U  . Cardiac catheterization 11/01/1990  . Skin cancer excision     removed from forehead and right leg, nose/face  . Upper gastrointestinal endoscopy 07/14/2009    erosive reflux esopagitis, Barrett's esophagus  . Colonoscopy 07/14/2009    diverticulosis, internal hemorrhoids    History   Social History  . Marital Status: Married    Spouse Name: N/A    Number of Children: 4  . Years of Education: N/A   Occupational History  . Retired    Social History Main Topics  . Smoking status: Former Smoker -- 2 years    Types: Pipe      Quit date: 11/07/1965  . Smokeless tobacco: Never Used     Comment: quit over 40 years ago, never smoked cigarettes  . Alcohol Use: No  . Drug Use: No  . Sexually Active: Not on file   Other Topics Concern  . Not on file   Social History Narrative  . No narrative on file    Current Outpatient Prescriptions on File Prior to Visit  Medication Sig Dispense Refill  . amiodarone (PACERONE) 200 MG tablet Take 200 mg by mouth daily.       Marland Kitchen aspirin EC 81 MG tablet Take 81 mg by mouth daily.      . digoxin (LANOXIN) 0.125 MG tablet Take 0.125 mg by mouth daily.      . ferrous sulfate 325 (65 FE) MG tablet Take 325 mg by mouth daily with breakfast.      . HYDROcodone-acetaminophen (NORCO/VICODIN) 5-325 MG per tablet Take 1 tablet by mouth every 4 (four) hours as needed for pain.  15 tablet  0  . methimazole (TAPAZOLE) 5 MG tablet Take 5 mg by mouth 3 (three) times a week. On Monday, Wednesday and Friday      . methocarbamol (ROBAXIN) 500 MG tablet Take 1 tablet (500 mg total) by mouth 2 (two) times daily.  20 tablet  0  . metoprolol (LOPRESSOR) 50 MG tablet Take 50 mg by mouth  2 (two) times daily.      . Multiple Vitamin (MULTIVITAMIN WITH MINERALS) TABS Take 1 tablet by mouth daily. Centrum Silver      . pantoprazole (PROTONIX) 40 MG tablet Take 1 tablet (40 mg total) by mouth 2 (two) times daily.  180 tablet  3  . simvastatin (ZOCOR) 40 MG tablet Take 1 tablet (40 mg total) by mouth at bedtime.  90 tablet  0  . Tamsulosin HCl (FLOMAX) 0.4 MG CAPS Take by mouth every evening.       . warfarin (COUMADIN) 3 MG tablet Take 3-4.5 mg by mouth every evening. 1 1/2 tabs (4.5 mg) on Monday, Wednesday and Friday, 1 tab (3 mg) on Tuesday, Thursday, Saturday and Sunday        No Known Allergies  Family History  Problem Relation Age of Onset  . Cancer Mother     Breast Cancer    BP 124/68  Pulse 87  Temp 97.8 F (36.6 C) (Oral)  Wt 170 lb (77.111 kg)  SpO2 96%    Review of  Systems Denies fever.      Objective:   Physical Exam VITAL SIGNS:  See vs page GENERAL: no distress NECK: There is no palpable thyroid enlargement.  No thyroid nodule is palpable.  No palpable lymphadenopathy at the anterior neck.    Lab Results  Component Value Date   TSH 4.804* 11/22/2012      Assessment & Plan:  Hyperthyroidism, well-controlled

## 2012-11-22 NOTE — Patient Instructions (Addendum)
A thyroid blood test is being requested for you today.  We'll contact you with results.   Please come back for a follow-up appointment in 3 months.

## 2012-11-30 ENCOUNTER — Telehealth: Payer: Self-pay

## 2012-11-30 ENCOUNTER — Other Ambulatory Visit: Payer: Self-pay

## 2012-11-30 MED ORDER — SIMVASTATIN 40 MG PO TABS: 40.0000 mg | ORAL_TABLET | Freq: Every day | ORAL | Status: DC

## 2012-11-30 NOTE — Telephone Encounter (Signed)
Pt requesting refill of simvastatin to his mail order pharmacy for 90 day supply

## 2012-12-07 ENCOUNTER — Telehealth: Payer: Self-pay | Admitting: Endocrinology

## 2012-12-07 MED ORDER — SIMVASTATIN 40 MG PO TABS: 40.0000 mg | ORAL_TABLET | Freq: Every day | ORAL | Status: DC

## 2012-12-18 ENCOUNTER — Encounter: Payer: Self-pay | Admitting: *Deleted

## 2013-01-22 ENCOUNTER — Encounter: Payer: Self-pay | Admitting: Endocrinology

## 2013-01-25 ENCOUNTER — Ambulatory Visit (INDEPENDENT_AMBULATORY_CARE_PROVIDER_SITE_OTHER): Payer: Medicare Other | Admitting: Endocrinology

## 2013-01-25 ENCOUNTER — Encounter: Payer: Self-pay | Admitting: Endocrinology

## 2013-01-25 ENCOUNTER — Ambulatory Visit
Admission: RE | Admit: 2013-01-25 | Discharge: 2013-01-25 | Disposition: A | Discharge status: Home / Self Care | Payer: Medicare Other | Source: Ambulatory Visit | Attending: Endocrinology | Admitting: Endocrinology

## 2013-01-25 VITALS — BP 130/80 | HR 90 | Wt 175.0 lb

## 2013-01-25 DIAGNOSIS — J90 Pleural effusion, not elsewhere classified: Secondary | ICD-10-CM

## 2013-01-25 DIAGNOSIS — E0590 Thyrotoxicosis, unspecified without thyrotoxic crisis or storm: Secondary | ICD-10-CM

## 2013-01-25 LAB — TSH: TSH: 2.91 u[IU]/mL (ref 0.35–5.50)

## 2013-01-25 MED ORDER — CEFUROXIME AXETIL 250 MG PO TABS: 250.0000 mg | ORAL_TABLET | Freq: Two times a day (BID) | ORAL | Status: AC

## 2013-01-25 MED ORDER — BENZONATATE 100 MG PO CAPS: 100.0000 mg | ORAL_CAPSULE | Freq: Two times a day (BID) | ORAL | Status: DC | PRN

## 2013-01-25 MED ORDER — FLUTICASONE-SALMETEROL 100-50 MCG/DOSE IN AEPB: 1.0000 | INHALATION_SPRAY | Freq: Two times a day (BID) | RESPIRATORY_TRACT | Status: DC

## 2013-01-25 NOTE — Patient Instructions (Addendum)
An x-ray and a blood test are being requested for you today.  We'll contact you with results. i have sent 3 prescriptions to your pharmacy: inhaler, cough medicine, and antibiotic.  Please come back for a follow-up appointment in 3 months.

## 2013-01-25 NOTE — Progress Notes (Signed)
Subjective:    Patient ID: [name] [name], male    DOB: Nov 28, 1928, 79 y.o.   MRN: [id number]  HPI Pt states few days of moderate prod-quality cough in the chest, and assoc wheezing.   Past Medical History  Diagnosis Date  . GASTRIC POLYP 05/13/2009  . COLONIC POLYPS, ADENOMATOUS 01/14/2008  . HYPERLIPIDEMIA 06/01/2007  . ANEMIA, IRON DEFICIENCY 10/13/2008  . COAGULOPATHY, COUMADIN-INDUCED 01/14/2008  . ANXIETY 09/04/2008  . HYPERTENSION 06/01/2007  . ATRIAL FIBRILLATION, CHRONIC 01/14/2008    on warfarin since 2007  . UNSPECIFIED VENOUS INSUFFICIENCY 09/18/2008  . PLEURAL EFFUSION, LEFT 10/13/2008  . GERD 06/01/2007  . BARRETTS ESOPHAGUS 03/20/2009  . PROSTATE CANCER, HX OF 01/14/2008  . CATARACTS, BILATERAL, HX OF 01/14/2008  . CEREBROVASCULAR ACCIDENT, HX OF 08/18/2008  . PERSONAL HX COLONIC POLYPS 01/01/2010  . BASAL CELL CARCINOMA OF SKIN SITE UNSPECIFIED 09/28/2010  . HYPERTHYROIDISM 01/06/2011  . Hyperglycemia   . Elevated PSA   . Restless leg syndrome   . Mitral regurgitation 06/13/2008    mitral valve replacement #20 St. Jude Biocor; Maze procedure by Dr. Janey Genta  at [facility]  . CORONARY ARTERY DISEASE 06/01/2007    Cath 04/03/2008; Cath 11/01/1990  . Tachy-brady syndrome 07/13/2008    medtronic Adapta gen. model ADDR01 ,serial # [id number] H by Dr  Dallas Breeding at [facility]  . Peripheral artery disease 06/16/2010    LEA doppler-left ABI nml 0.61 calcified vessels;right ABI  0.68  . Edema, peripheral 07/30/2009    LEV doppler- no thrombus or thrombophlebitis  . Aortic valvular disorder 08/16/2012    echo EF 45-50% LV mildly reduce,Biocor mitral valve,mild to mod. tricuspid regrug.,mild to mod. aortic regrug.  . Mitral valve disorder 09/08/2011    echo EF 50-55% LV normal    Past Surgical History  Procedure Laterality Date  . Prostatectomy    . Tonsillectomy    . Cystectomy      Left leg  . Pacemaker placement  07/13/2008    Adapata dual chamber-Medtronic at [facility]   . Coronary artery  bypass graft  06/13/2008    graft x1 w/vein graft LAD artery by Dr. Janey Genta at [facility]  . Mitral valve replacement  06/13/2008    #20 St. Jude Bicor mitral valve ;Maze procedure at [facility] by Dr. Janey Genta  . Cardiac catheterization  11/01/1990  . Skin cancer excision      removed from forehead and right leg, nose/face  . Upper gastrointestinal endoscopy  07/14/2009    erosive reflux esopagitis, Barrett's esophagus  . Colonoscopy  07/14/2009    diverticulosis, internal hemorrhoids  . Cardiovascular stress test  06/03/2010    Persantine perfusion EF 45% mild to moderate ischemia basal and mid inferolateral region  . Cardiovascular stress test  01/01/2008    left ventricle normal,no significant ischemia  . Cardiovascular stress test  09/09/2005     possibility of ischemia inferior wall    History   Social History  . Marital Status: Married    Spouse Name: N/A    Number of Children: 4  . Years of Education: N/A   Occupational History  . Retired    Social History Main Topics  . Smoking status: Former Smoker -- 2 years    Types: Pipe    Quit date: 11/07/1965  . Smokeless tobacco: Never Used     Comment: quit over 40 years ago, never smoked cigarettes  . Alcohol Use: No  . Drug Use: No  . Sexually Active: Not on file  Other Topics Concern  . Not on file   Social History Narrative  . No narrative on file    Current Outpatient Prescriptions on File Prior to Visit  Medication Sig Dispense Refill  . amiodarone (PACERONE) 200 MG tablet Take 200 mg by mouth daily.       Marland Kitchen aspirin EC 81 MG tablet Take 81 mg by mouth daily.      . digoxin (LANOXIN) 0.125 MG tablet Take 0.125 mg by mouth daily.      . ferrous sulfate 325 (65 FE) MG tablet Take 325 mg by mouth daily with breakfast.      . HYDROcodone-acetaminophen (NORCO/VICODIN) 5-325 MG per tablet Take 1 tablet by mouth every 4 (four) hours as needed for pain.  15 tablet  0  . methimazole (TAPAZOLE) 5 MG tablet Take 5 mg by mouth 3  (three) times a week. On Monday, Wednesday and Friday      . methocarbamol (ROBAXIN) 500 MG tablet Take 1 tablet (500 mg total) by mouth 2 (two) times daily.  20 tablet  0  . metoprolol (LOPRESSOR) 50 MG tablet Take 50 mg by mouth 2 (two) times daily.      . Multiple Vitamin (MULTIVITAMIN WITH MINERALS) TABS Take 1 tablet by mouth daily. Centrum Silver      . pantoprazole (PROTONIX) 40 MG tablet Take 1 tablet (40 mg total) by mouth 2 (two) times daily.  180 tablet  3  . simvastatin (ZOCOR) 40 MG tablet Take 1 tablet (40 mg total) by mouth at bedtime.  90 tablet  6  . Tamsulosin HCl (FLOMAX) 0.4 MG CAPS Take by mouth every evening.       . warfarin (COUMADIN) 3 MG tablet Take 3-4.5 mg by mouth every evening. 1 1/2 tabs (4.5 mg) on Monday, Wednesday and Friday, 1 tab (3 mg) on Tuesday, Thursday, Saturday and Sunday       No current facility-administered medications on file prior to visit.    No Known Allergies  Family History  Problem Relation Age of Onset  . Cancer Mother     Breast Cancer    BP 130/80  Pulse 90  Wt 175 lb (79.379 kg)  BMI 25.11 kg/m2  SpO2 97%    Review of Systems Denies fever and weight change.      Objective:   Physical Exam VITAL SIGNS:  See vs page GENERAL: no distress head: no deformity eyes: no periorbital swelling, no proptosis external nose and ears are normal mouth: no lesion seen Both eac's and tm's are normal LUNGS:  Clear to auscultation.   Lab Results  Component Value Date   TSH 2.91 01/25/2013      Assessment & Plan:  Acute bronchitis, new Hyperthyroidism, well-controlled

## 2013-01-29 ENCOUNTER — Ambulatory Visit (INDEPENDENT_AMBULATORY_CARE_PROVIDER_SITE_OTHER): Payer: Medicare Other | Admitting: Endocrinology

## 2013-01-29 ENCOUNTER — Encounter: Payer: Self-pay | Admitting: Endocrinology

## 2013-01-29 VITALS — BP 124/70 | HR 90 | Wt 176.0 lb

## 2013-01-29 DIAGNOSIS — R062 Wheezing: Secondary | ICD-10-CM

## 2013-01-29 MED ORDER — MOMETASONE FURO-FORMOTEROL FUM 200-5 MCG/ACT IN AERO: 2.0000 | INHALATION_SPRAY | Freq: Two times a day (BID) | RESPIRATORY_TRACT | Status: DC

## 2013-01-29 NOTE — Patient Instructions (Addendum)
i have sent a prescription to your pharmacy, for a different inhaler Refer back to dr Delford Field.  you will receive a phone call, about a day and time for an appointment

## 2013-01-29 NOTE — Progress Notes (Signed)
Subjective:    Patient ID: [name] [name], male    DOB: 1929/05/03, 79 y.o.   MRN: [id number]  HPI Pt was seen here last week for acute bronchitis.  Since then, he has slight tingling in the chest, and assoc wheezing. He attributes this to advair.   Past Medical History  Diagnosis Date  . GASTRIC POLYP 05/13/2009  . COLONIC POLYPS, ADENOMATOUS 01/14/2008  . HYPERLIPIDEMIA 06/01/2007  . ANEMIA, IRON DEFICIENCY 10/13/2008  . COAGULOPATHY, COUMADIN-INDUCED 01/14/2008  . ANXIETY 09/04/2008  . HYPERTENSION 06/01/2007  . ATRIAL FIBRILLATION, CHRONIC 01/14/2008    on warfarin since 2007  . UNSPECIFIED VENOUS INSUFFICIENCY 09/18/2008  . PLEURAL EFFUSION, LEFT 10/13/2008  . GERD 06/01/2007  . BARRETTS ESOPHAGUS 03/20/2009  . PROSTATE CANCER, HX OF 01/14/2008  . CATARACTS, BILATERAL, HX OF 01/14/2008  . CEREBROVASCULAR ACCIDENT, HX OF 08/18/2008  . PERSONAL HX COLONIC POLYPS 01/01/2010  . BASAL CELL CARCINOMA OF SKIN SITE UNSPECIFIED 09/28/2010  . HYPERTHYROIDISM 01/06/2011  . Hyperglycemia   . Elevated PSA   . Restless leg syndrome   . Mitral regurgitation 06/13/2008    mitral valve replacement #20 St. Jude Biocor; Maze procedure by Dr. Janey Genta  at [facility]  . CORONARY ARTERY DISEASE 06/01/2007    Cath 04/03/2008; Cath 11/01/1990  . Tachy-brady syndrome 07/13/2008    medtronic Adapta gen. model ADDR01 ,serial # [id number] H by Dr  Dallas Breeding at [facility]  . Peripheral artery disease 06/16/2010    LEA doppler-left ABI nml 0.61 calcified vessels;right ABI  0.68  . Edema, peripheral 07/30/2009    LEV doppler- no thrombus or thrombophlebitis  . Aortic valvular disorder 08/16/2012    echo EF 45-50% LV mildly reduce,Biocor mitral valve,mild to mod. tricuspid regrug.,mild to mod. aortic regrug.  . Mitral valve disorder 09/08/2011    echo EF 50-55% LV normal    Past Surgical History  Procedure Laterality Date  . Prostatectomy    . Tonsillectomy    . Cystectomy      Left leg  . Pacemaker placement  07/13/2008     Adapata dual chamber-Medtronic at [facility]   . Coronary artery bypass graft  06/13/2008    graft x1 w/vein graft LAD artery by Dr. Janey Genta at [facility]  . Mitral valve replacement  06/13/2008    #20 St. Jude Bicor mitral valve ;Maze procedure at [facility] by Dr. Janey Genta  . Cardiac catheterization  11/01/1990  . Skin cancer excision      removed from forehead and right leg, nose/face  . Upper gastrointestinal endoscopy  07/14/2009    erosive reflux esopagitis, Barrett's esophagus  . Colonoscopy  07/14/2009    diverticulosis, internal hemorrhoids  . Cardiovascular stress test  06/03/2010    Persantine perfusion EF 45% mild to moderate ischemia basal and mid inferolateral region  . Cardiovascular stress test  01/01/2008    left ventricle normal,no significant ischemia  . Cardiovascular stress test  09/09/2005     possibility of ischemia inferior wall    History   Social History  . Marital Status: Married    Spouse Name: N/A    Number of Children: 4  . Years of Education: N/A   Occupational History  . Retired    Social History Main Topics  . Smoking status: Former Smoker -- 2 years    Types: Pipe    Quit date: 11/07/1965  . Smokeless tobacco: Never Used     Comment: quit over 40 years ago, never smoked cigarettes  . Alcohol Use: No  .  Drug Use: No  . Sexually Active: Not on file   Other Topics Concern  . Not on file   Social History Narrative  . No narrative on file    Current Outpatient Prescriptions on File Prior to Visit  Medication Sig Dispense Refill  . amiodarone (PACERONE) 200 MG tablet Take 200 mg by mouth daily.       Marland Kitchen aspirin EC 81 MG tablet Take 81 mg by mouth daily.      . benzonatate (TESSALON) 100 MG capsule Take 1 capsule (100 mg total) by mouth 2 (two) times daily as needed for cough.  20 capsule  0  . cefUROXime (CEFTIN) 250 MG tablet Take 1 tablet (250 mg total) by mouth 2 (two) times daily.  14 tablet  0  . digoxin (LANOXIN) 0.125 MG tablet Take 0.125 mg by  mouth daily.      . ferrous sulfate 325 (65 FE) MG tablet Take 325 mg by mouth daily with breakfast.      . HYDROcodone-acetaminophen (NORCO/VICODIN) 5-325 MG per tablet Take 1 tablet by mouth every 4 (four) hours as needed for pain.  15 tablet  0  . methimazole (TAPAZOLE) 5 MG tablet Take 5 mg by mouth 3 (three) times a week. On Monday, Wednesday and Friday      . methocarbamol (ROBAXIN) 500 MG tablet Take 1 tablet (500 mg total) by mouth 2 (two) times daily.  20 tablet  0  . metoprolol (LOPRESSOR) 50 MG tablet Take 50 mg by mouth 2 (two) times daily.      . Multiple Vitamin (MULTIVITAMIN WITH MINERALS) TABS Take 1 tablet by mouth daily. Centrum Silver      . pantoprazole (PROTONIX) 40 MG tablet Take 1 tablet (40 mg total) by mouth 2 (two) times daily.  180 tablet  3  . simvastatin (ZOCOR) 40 MG tablet Take 1 tablet (40 mg total) by mouth at bedtime.  90 tablet  6  . Tamsulosin HCl (FLOMAX) 0.4 MG CAPS Take by mouth every evening.       . warfarin (COUMADIN) 3 MG tablet Take 3-4.5 mg by mouth every evening. 1 1/2 tabs (4.5 mg) on Monday, Wednesday and Friday, 1 tab (3 mg) on Tuesday, Thursday, Saturday and Sunday       No current facility-administered medications on file prior to visit.    No Known Allergies  Family History  Problem Relation Age of Onset  . Cancer Mother     Breast Cancer    BP 124/70  Pulse 90  Wt 176 lb (79.833 kg)  BMI 25.25 kg/m2  SpO2 95%  Review of Systems Denies fever.     Objective:   Physical Exam VITAL SIGNS:  See vs page GENERAL: no distress LUNGS:  Clear to auscultation, except for a few rales at the left base      Assessment & Plan:  Wheezing, persistent Paresthesias, new, uncertain etiology.

## 2013-02-23 ENCOUNTER — Encounter: Payer: Self-pay | Admitting: Pharmacist Clinician (PhC)/ Clinical Pharmacy Specialist

## 2013-02-23 DIAGNOSIS — Z8679 Personal history of other diseases of the circulatory system: Secondary | ICD-10-CM

## 2013-02-23 DIAGNOSIS — Z7901 Long term (current) use of anticoagulants: Secondary | ICD-10-CM

## 2013-02-23 DIAGNOSIS — I4891 Unspecified atrial fibrillation: Secondary | ICD-10-CM

## 2013-02-26 ENCOUNTER — Ambulatory Visit (INDEPENDENT_AMBULATORY_CARE_PROVIDER_SITE_OTHER): Payer: Medicare Other | Admitting: Endocrinology

## 2013-02-26 ENCOUNTER — Encounter: Payer: Self-pay | Admitting: Endocrinology

## 2013-02-26 VITALS — BP 128/80 | HR 78 | Wt 170.0 lb

## 2013-02-26 DIAGNOSIS — I4891 Unspecified atrial fibrillation: Secondary | ICD-10-CM

## 2013-02-26 DIAGNOSIS — E0590 Thyrotoxicosis, unspecified without thyrotoxic crisis or storm: Secondary | ICD-10-CM

## 2013-02-26 DIAGNOSIS — R319 Hematuria, unspecified: Secondary | ICD-10-CM

## 2013-02-26 MED ORDER — METOPROLOL SUCCINATE ER 100 MG PO TB24: 100.0000 mg | ORAL_TABLET | Freq: Every day | ORAL | Status: DC

## 2013-02-26 NOTE — Progress Notes (Signed)
Subjective:    Patient ID: [name] [name], male    DOB: 10-Aug-1929, 79 y.o.   MRN: [id number]  HPI Pt takes metoprolol for AF.  He has trouble remembering to take the evening dose.  Sob is much less now. Past Medical History  Diagnosis Date  . GASTRIC POLYP 05/13/2009  . COLONIC POLYPS, ADENOMATOUS 01/14/2008  . HYPERLIPIDEMIA 06/01/2007  . ANEMIA, IRON DEFICIENCY 10/13/2008  . COAGULOPATHY, COUMADIN-INDUCED 01/14/2008  . ANXIETY 09/04/2008  . HYPERTENSION 06/01/2007  . ATRIAL FIBRILLATION, CHRONIC 01/14/2008    on warfarin since 2007  . UNSPECIFIED VENOUS INSUFFICIENCY 09/18/2008  . PLEURAL EFFUSION, LEFT 10/13/2008  . GERD 06/01/2007  . BARRETTS ESOPHAGUS 03/20/2009  . PROSTATE CANCER, HX OF 01/14/2008  . CATARACTS, BILATERAL, HX OF 01/14/2008  . CEREBROVASCULAR ACCIDENT, HX OF 08/18/2008  . PERSONAL HX COLONIC POLYPS 01/01/2010  . BASAL CELL CARCINOMA OF SKIN SITE UNSPECIFIED 09/28/2010  . HYPERTHYROIDISM 01/06/2011  . Hyperglycemia   . Elevated PSA   . Restless leg syndrome   . Mitral regurgitation 06/13/2008    mitral valve replacement #20 St. Jude Biocor; Maze procedure by Dr. Janey Genta  at [facility]  . CORONARY ARTERY DISEASE 06/01/2007    Cath 04/03/2008; Cath 11/01/1990  . Tachy-brady syndrome 07/13/2008    medtronic Adapta gen. model ADDR01 ,serial # [id number] H by Dr  Dallas Breeding at [facility]  . Peripheral artery disease 06/16/2010    LEA doppler-left ABI nml 0.61 calcified vessels;right ABI  0.68  . Edema, peripheral 07/30/2009    LEV doppler- no thrombus or thrombophlebitis  . Aortic valvular disorder 08/16/2012    echo EF 45-50% LV mildly reduce,Biocor mitral valve,mild to mod. tricuspid regrug.,mild to mod. aortic regrug.  . Mitral valve disorder 09/08/2011    echo EF 50-55% LV normal    Past Surgical History  Procedure Laterality Date  . Prostatectomy    . Tonsillectomy    . Cystectomy      Left leg  . Pacemaker placement  07/13/2008    Adapata dual chamber-Medtronic at [facility]   .  Coronary artery bypass graft  06/13/2008    graft x1 w/vein graft LAD artery by Dr. Janey Genta at [facility]  . Mitral valve replacement  06/13/2008    #20 St. Jude Bicor mitral valve ;Maze procedure at [facility] by Dr. Janey Genta  . Cardiac catheterization  11/01/1990  . Skin cancer excision      removed from forehead and right leg, nose/face  . Upper gastrointestinal endoscopy  07/14/2009    erosive reflux esopagitis, Barrett's esophagus  . Colonoscopy  07/14/2009    diverticulosis, internal hemorrhoids  . Cardiovascular stress test  06/03/2010    Persantine perfusion EF 45% mild to moderate ischemia basal and mid inferolateral region  . Cardiovascular stress test  01/01/2008    left ventricle normal,no significant ischemia  . Cardiovascular stress test  09/09/2005     possibility of ischemia inferior wall    History   Social History  . Marital Status: Married    Spouse Name: N/A    Number of Children: 4  . Years of Education: N/A   Occupational History  . Retired    Social History Main Topics  . Smoking status: Former Smoker -- 2 years    Types: Pipe    Quit date: 11/07/1965  . Smokeless tobacco: Never Used     Comment: quit over 40 years ago, never smoked cigarettes  . Alcohol Use: No  . Drug Use: No  . Sexually  Active: Not on file   Other Topics Concern  . Not on file   Social History Narrative  . No narrative on file    Current Outpatient Prescriptions on File Prior to Visit  Medication Sig Dispense Refill  . amiodarone (PACERONE) 200 MG tablet Take 200 mg by mouth daily.       Marland Kitchen aspirin EC 81 MG tablet Take 81 mg by mouth daily.      . digoxin (LANOXIN) 0.125 MG tablet Take 0.125 mg by mouth daily.      . ferrous sulfate 325 (65 FE) MG tablet Take 325 mg by mouth daily with breakfast.      . methimazole (TAPAZOLE) 5 MG tablet Take 5 mg by mouth 3 (three) times a week. On Monday, Wednesday and Friday      . methocarbamol (ROBAXIN) 500 MG tablet Take 1 tablet (500 mg total) by  mouth 2 (two) times daily.  20 tablet  0  . mometasone-formoterol (DULERA) 200-5 MCG/ACT AERO Inhale 2 puffs into the lungs 2 (two) times daily.  1 Inhaler  1  . Multiple Vitamin (MULTIVITAMIN WITH MINERALS) TABS Take 1 tablet by mouth daily. Centrum Silver      . pantoprazole (PROTONIX) 40 MG tablet Take 1 tablet (40 mg total) by mouth 2 (two) times daily.  180 tablet  3  . simvastatin (ZOCOR) 40 MG tablet Take 1 tablet (40 mg total) by mouth at bedtime.  90 tablet  6  . Tamsulosin HCl (FLOMAX) 0.4 MG CAPS Take by mouth every evening.       . warfarin (COUMADIN) 3 MG tablet Take 3-4.5 mg by mouth every evening. 1 1/2 tabs (4.5 mg) on Monday, Wednesday and Friday, 1 tab (3 mg) on Tuesday, Thursday, Saturday and Sunday       No current facility-administered medications on file prior to visit.    No Known Allergies  Family History  Problem Relation Age of Onset  . Cancer Mother     Breast Cancer    BP 128/80  Pulse 78  Wt 170 lb (77.111 kg)  BMI 24.39 kg/m2  SpO2 98%  Review of Systems He denies palpitations    Objective:   Physical Exam VITAL SIGNS:  See vs page GENERAL: no distress HEART:  Regular rate and rhythm with high-pitched systolic murmur noted.     Assessment & Plan:  AF, therapy limited by noncompliance.  i'll do the best i can.  He may do better with qd metoprolol.

## 2013-02-26 NOTE — Patient Instructions (Addendum)
i have sent a prescription to your pharmacy, to change metoprolol to the once-a-day extended-release.   Please come back for a follow-up appointment in 2 months

## 2013-02-27 NOTE — Telephone Encounter (Signed)
Completed saw Dr Ellison  °

## 2013-03-12 ENCOUNTER — Ambulatory Visit: Payer: Medicare Other | Admitting: Critical Care Medicine

## 2013-03-29 ENCOUNTER — Encounter: Payer: Self-pay | Admitting: Critical Care Medicine

## 2013-03-29 ENCOUNTER — Ambulatory Visit (INDEPENDENT_AMBULATORY_CARE_PROVIDER_SITE_OTHER): Payer: Medicare Other | Admitting: Critical Care Medicine

## 2013-03-29 VITALS — BP 110/80 | HR 84 | Temp 97.8°F | Ht 70.0 in | Wt 173.0 lb

## 2013-03-29 DIAGNOSIS — J984 Other disorders of lung: Secondary | ICD-10-CM

## 2013-03-29 DIAGNOSIS — J90 Pleural effusion, not elsewhere classified: Secondary | ICD-10-CM

## 2013-03-29 DIAGNOSIS — R062 Wheezing: Secondary | ICD-10-CM

## 2013-03-29 NOTE — Progress Notes (Signed)
Subjective:    Patient ID: [name] [name], male    DOB: 07-23-1929, 79 y.o.   MRN: [id number]  HPI   03/29/2013 Not seen since 2012 .   Pt started wheezing two months ago, ?uri.   Prior L effusion d/t parapneumonic process 2012 Pt saws PCP , Rx Advair 01/29/13 , side effects made dyspnea worse.  Pt then rx dulera and this helped. Seemed to get over this and now no wheezing at all.  No cough. CXR 01/25/13 neg for pleural effusion Now No cough, no wheeze or mucus, No edema in feet.  Lifelong never smoker    Past Medical History  Diagnosis Date  . GASTRIC POLYP 05/13/2009  . COLONIC POLYPS, ADENOMATOUS 01/14/2008  . HYPERLIPIDEMIA 06/01/2007  . ANEMIA, IRON DEFICIENCY 10/13/2008  . COAGULOPATHY, COUMADIN-INDUCED 01/14/2008  . ANXIETY 09/04/2008  . HYPERTENSION 06/01/2007  . ATRIAL FIBRILLATION, CHRONIC 01/14/2008    on warfarin since 2007  . UNSPECIFIED VENOUS INSUFFICIENCY 09/18/2008  . PLEURAL EFFUSION, LEFT 10/13/2008  . GERD 06/01/2007  . BARRETTS ESOPHAGUS 03/20/2009  . PROSTATE CANCER, HX OF 01/14/2008  . CATARACTS, BILATERAL, HX OF 01/14/2008  . CEREBROVASCULAR ACCIDENT, HX OF 08/18/2008  . PERSONAL HX COLONIC POLYPS 01/01/2010  . BASAL CELL CARCINOMA OF SKIN SITE UNSPECIFIED 09/28/2010  . HYPERTHYROIDISM 01/06/2011  . Hyperglycemia   . Elevated PSA   . Restless leg syndrome   . Mitral regurgitation 06/13/2008    mitral valve replacement #20 St. Jude Biocor; Maze procedure by Dr. Janey Genta  at [facility]  . CORONARY ARTERY DISEASE 06/01/2007    Cath 04/03/2008; Cath 11/01/1990  . Tachy-brady syndrome 07/13/2008    medtronic Adapta gen. model ADDR01 ,serial # [id number] H by Dr  Dallas Breeding at [facility]  . Peripheral artery disease 06/16/2010    LEA doppler-left ABI nml 0.61 calcified vessels;right ABI  0.68  . Edema, peripheral 07/30/2009    LEV doppler- no thrombus or thrombophlebitis  . Aortic valvular disorder 08/16/2012    echo EF 45-50% LV mildly reduce,Biocor mitral valve,mild to mod.  tricuspid regrug.,mild to mod. aortic regrug.  . Mitral valve disorder 09/08/2011    echo EF 50-55% LV normal     Family History  Problem Relation Age of Onset  . Cancer Mother     Breast Cancer     History   Social History  . Marital Status: Married    Spouse Name: N/A    Number of Children: 4  . Years of Education: N/A   Occupational History  . Retired    Social History Main Topics  . Smoking status: Former Smoker -- 2 years    Types: Pipe    Quit date: 11/07/1965  . Smokeless tobacco: Never Used     Comment: quit over 40 years ago, never smoked cigarettes  . Alcohol Use: No  . Drug Use: No  . Sexually Active: Not on file   Other Topics Concern  . Not on file   Social History Narrative  . No narrative on file     No Known Allergies   Outpatient Prescriptions Prior to Visit  Medication Sig Dispense Refill  . amiodarone (PACERONE) 200 MG tablet Take 200 mg by mouth daily.       Marland Kitchen aspirin EC 81 MG tablet Take 81 mg by mouth daily.      . digoxin (LANOXIN) 0.125 MG tablet Take 0.125 mg by mouth daily.      . ferrous sulfate 325 (65 FE) MG tablet Take  325 mg by mouth daily with breakfast.      . methimazole (TAPAZOLE) 5 MG tablet Take 5 mg by mouth 3 (three) times a week. On Monday, Wednesday and Friday      . methocarbamol (ROBAXIN) 500 MG tablet Take 1 tablet (500 mg total) by mouth 2 (two) times daily.  20 tablet  0  . metoprolol succinate (TOPROL-XL) 100 MG 24 hr tablet Take 1 tablet (100 mg total) by mouth daily. Take with or immediately following a meal.  90 tablet  3  . Multiple Vitamin (MULTIVITAMIN WITH MINERALS) TABS Take 1 tablet by mouth daily. Centrum Silver      . pantoprazole (PROTONIX) 40 MG tablet Take 1 tablet (40 mg total) by mouth 2 (two) times daily.  180 tablet  3  . simvastatin (ZOCOR) 40 MG tablet Take 1 tablet (40 mg total) by mouth at bedtime.  90 tablet  6  . Tamsulosin HCl (FLOMAX) 0.4 MG CAPS Take by mouth every evening.       . warfarin  (COUMADIN) 3 MG tablet Take 3-4.5 mg by mouth every evening. 1 1/2 tabs (4.5 mg) on Monday, Wednesday and Friday, 1 tab (3 mg) on Tuesday, Thursday, Saturday and Sunday      . mometasone-formoterol (DULERA) 200-5 MCG/ACT AERO Inhale 2 puffs into the lungs 2 (two) times daily.  1 Inhaler  1   No facility-administered medications prior to visit.      Review of Systems  Constitutional: Positive for activity change, fatigue and unexpected weight change. Negative for chills, diaphoresis and appetite change.  HENT: Positive for voice change. Negative for hearing loss, nosebleeds, congestion, facial swelling, sneezing, mouth sores, trouble swallowing, neck stiffness, dental problem, postnasal drip, sinus pressure, tinnitus and ear discharge.   Eyes: Negative for photophobia, discharge, itching and visual disturbance.  Respiratory: Negative for apnea, cough, choking, chest tightness and stridor.   Cardiovascular: Negative for palpitations.  Gastrointestinal: Positive for constipation. Negative for nausea, blood in stool and abdominal distention.  Genitourinary: Positive for dysuria, frequency and difficulty urinating. Negative for urgency, hematuria, flank pain and decreased urine volume.  Musculoskeletal: Negative for myalgias, back pain, joint swelling, arthralgias and gait problem.  Skin: Negative for color change and pallor.  Neurological: Negative for dizziness, tremors, seizures, syncope, speech difficulty, weakness, light-headedness and numbness.  Hematological: Negative for adenopathy. Does not bruise/bleed easily.  Psychiatric/Behavioral: Negative for confusion, sleep disturbance and agitation. The patient is not nervous/anxious.        Objective:   Physical Exam  Filed Vitals:   03/29/13 1443  BP: 110/80  Pulse: 84  Temp: 97.8 F (36.6 C)  TempSrc: Oral  Height: 5\' 10"  (1.778 m)  Weight: 78.472 kg (173 lb)  SpO2: 96%    Gen: Pleasant, well-nourished, in no distress,  normal  affect  ENT: No lesions,  mouth clear,  oropharynx clear, no postnasal drip  Neck: No JVD, no TMG, no carotid bruits  Lungs: No use of accessory muscles, no dullness to percussion Cardiovascular: RRR, heart sounds normal, no murmur or gallops, 1+ peripheral edema  Abdomen: soft and NT, no HSM,  BS normal  Musculoskeletal: No deformities, no cyanosis or clubbing  Neuro: alert, non focal  Skin: Warm, no lesions or rashes   No results found.    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 amiodarone (PACERONE) 200 MG tablet Take 200 mg by mouth daily.       Marland Kitchen aspirin EC 81 MG tablet Take 81 mg by mouth daily.      . digoxin (LANOXIN) 0.125 MG tablet Take 0.125 mg by mouth daily.      . ferrous sulfate 325 (65 FE) MG tablet Take 325 mg by mouth daily with breakfast.      . methimazole (TAPAZOLE) 5 MG tablet Take 5 mg by mouth 3 (three) times a week. On Monday, Wednesday and Friday      . methocarbamol (ROBAXIN) 500 MG tablet Take 1 tablet (500 mg total) by mouth 2 (two) times daily.  20 tablet  0  . metoprolol succinate (TOPROL-XL) 100 MG 24 hr tablet Take 1 tablet (100 mg total) by mouth daily. Take with or immediately following a meal.  90 tablet  3  . Multiple Vitamin (MULTIVITAMIN WITH MINERALS) TABS Take 1 tablet by mouth daily. Centrum Silver      . pantoprazole (PROTONIX) 40 MG tablet Take 1 tablet (40 mg total) by mouth 2 (two) times daily.  180 tablet  3  . simvastatin (ZOCOR) 40 MG tablet Take 1 tablet (40 mg total) by mouth at bedtime.  90 tablet  6  . Tamsulosin HCl (FLOMAX) 0.4 MG CAPS Take by mouth every evening.       . warfarin (COUMADIN) 3 MG tablet Take 3-4.5 mg by mouth every evening. 1 1/2 tabs (4.5 mg) on Monday,  Wednesday and Friday, 1 tab (3 mg) on Tuesday, Thursday, Saturday and Sunday      . [DISCONTINUED] mometasone-formoterol (DULERA) 200-5 MCG/ACT AERO Inhale 2 puffs into the lungs 2 (two) times daily.  1 Inhaler  1   No facility-administered encounter medications on file as of 03/29/2013.

## 2013-03-29 NOTE — Patient Instructions (Addendum)
Lets watch your breathing for now Call if you worsen Return 4 months

## 2013-03-30 DIAGNOSIS — J984 Other disorders of lung: Secondary | ICD-10-CM | POA: Insufficient documentation

## 2013-03-30 NOTE — Assessment & Plan Note (Signed)
No recurrence of effusion observation

## 2013-03-30 NOTE — Assessment & Plan Note (Signed)
Restrictive lung disease on the basis of kyphosis Plan observation

## 2013-04-16 ENCOUNTER — Ambulatory Visit (INDEPENDENT_AMBULATORY_CARE_PROVIDER_SITE_OTHER): Payer: Medicare Other | Admitting: Pharmacist Clinician (PhC)/ Clinical Pharmacy Specialist

## 2013-04-16 VITALS — BP 108/64 | HR 72

## 2013-04-16 DIAGNOSIS — I4891 Unspecified atrial fibrillation: Secondary | ICD-10-CM

## 2013-04-16 DIAGNOSIS — Z7901 Long term (current) use of anticoagulants: Secondary | ICD-10-CM

## 2013-04-16 DIAGNOSIS — Z8679 Personal history of other diseases of the circulatory system: Secondary | ICD-10-CM

## 2013-04-16 LAB — POCT INR: INR: 2.4

## 2013-05-07 ENCOUNTER — Other Ambulatory Visit: Payer: Self-pay | Admitting: *Deleted

## 2013-05-07 MED ORDER — SIMVASTATIN 40 MG PO TABS: 40.0000 mg | ORAL_TABLET | Freq: Every day | ORAL | Status: DC

## 2013-05-14 ENCOUNTER — Other Ambulatory Visit: Payer: Self-pay

## 2013-05-14 ENCOUNTER — Ambulatory Visit (INDEPENDENT_AMBULATORY_CARE_PROVIDER_SITE_OTHER): Payer: Medicare Other | Admitting: Pharmacist Clinician (PhC)/ Clinical Pharmacy Specialist

## 2013-05-14 VITALS — BP 122/64 | HR 80

## 2013-05-14 DIAGNOSIS — I4891 Unspecified atrial fibrillation: Secondary | ICD-10-CM

## 2013-05-14 DIAGNOSIS — Z8679 Personal history of other diseases of the circulatory system: Secondary | ICD-10-CM

## 2013-05-14 DIAGNOSIS — Z7901 Long term (current) use of anticoagulants: Secondary | ICD-10-CM

## 2013-05-14 MED ORDER — SIMVASTATIN 40 MG PO TABS: 40.0000 mg | ORAL_TABLET | Freq: Every day | ORAL | Status: DC

## 2013-05-20 ENCOUNTER — Other Ambulatory Visit: Payer: Self-pay | Admitting: Ophthalmology

## 2013-05-27 ENCOUNTER — Ambulatory Visit: Payer: Medicare Other | Admitting: Pharmacist Clinician (PhC)/ Clinical Pharmacy Specialist

## 2013-05-28 ENCOUNTER — Encounter: Payer: Self-pay | Admitting: Endocrinology

## 2013-05-28 ENCOUNTER — Ambulatory Visit (INDEPENDENT_AMBULATORY_CARE_PROVIDER_SITE_OTHER): Payer: Medicare Other | Admitting: Endocrinology

## 2013-05-28 ENCOUNTER — Ambulatory Visit: Payer: Medicare Other | Admitting: Pharmacist Clinician (PhC)/ Clinical Pharmacy Specialist

## 2013-05-28 VITALS — BP 100/60 | HR 91 | Temp 97.4°F | Ht 70.0 in | Wt 168.3 lb

## 2013-05-28 DIAGNOSIS — D509 Iron deficiency anemia, unspecified: Secondary | ICD-10-CM

## 2013-05-28 DIAGNOSIS — E0590 Thyrotoxicosis, unspecified without thyrotoxic crisis or storm: Secondary | ICD-10-CM

## 2013-05-28 LAB — TSH: TSH: 4.25 u[IU]/mL (ref 0.35–5.50)

## 2013-05-28 LAB — CBC WITH DIFFERENTIAL/PLATELET
Basophils Relative: 0.5 % (ref 0.0–3.0)
Eosinophils Relative: 1.7 % (ref 0.0–5.0)
HCT: 36.8 % — ABNORMAL LOW (ref 39.0–52.0)
MCV: 94.2 fl (ref 78.0–100.0)
Monocytes Absolute: 0.5 10*3/uL (ref 0.1–1.0)
Monocytes Relative: 8.8 % (ref 3.0–12.0)
Neutrophils Relative %: 66.4 % (ref 43.0–77.0)
Platelets: 141 10*3/uL — ABNORMAL LOW (ref 150.0–400.0)
RBC: 3.91 Mil/uL — ABNORMAL LOW (ref 4.22–5.81)
WBC: 5.7 10*3/uL (ref 4.5–10.5)

## 2013-05-28 LAB — IBC PANEL
Iron: 63 ug/dL (ref 42–165)
Transferrin: 202.9 mg/dL — ABNORMAL LOW (ref 212.0–360.0)

## 2013-05-28 NOTE — Progress Notes (Signed)
Subjective:    Patient ID: [name] [name], male    DOB: 1929-02-26, 79 y.o.   MRN: [id number]  HPI Pt returns for f/u of hyperthyroidism (dx'ed 2012; tapazole rx was chosen for prompt normalization, due to AF; cause is uncertain).  pt states he feels well in general.  Denies brbpr and hematuria. Past Medical History  Diagnosis Date  . GASTRIC POLYP 05/13/2009  . COLONIC POLYPS, ADENOMATOUS 01/14/2008  . HYPERLIPIDEMIA 06/01/2007  . ANEMIA, IRON DEFICIENCY 10/13/2008  . COAGULOPATHY, COUMADIN-INDUCED 01/14/2008  . ANXIETY 09/04/2008  . HYPERTENSION 06/01/2007  . ATRIAL FIBRILLATION, CHRONIC 01/14/2008    on warfarin since 2007  . UNSPECIFIED VENOUS INSUFFICIENCY 09/18/2008  . PLEURAL EFFUSION, LEFT 10/13/2008  . GERD 06/01/2007  . BARRETTS ESOPHAGUS 03/20/2009  . PROSTATE CANCER, HX OF 01/14/2008  . CATARACTS, BILATERAL, HX OF 01/14/2008  . CEREBROVASCULAR ACCIDENT, HX OF 08/18/2008  . PERSONAL HX COLONIC POLYPS 01/01/2010  . BASAL CELL CARCINOMA OF SKIN SITE UNSPECIFIED 09/28/2010  . HYPERTHYROIDISM 01/06/2011  . Hyperglycemia   . Elevated PSA   . Restless leg syndrome   . Mitral regurgitation 06/13/2008    mitral valve replacement #20 St. Jude Biocor; Maze procedure by Dr. Janey Genta  at [facility]  . CORONARY ARTERY DISEASE 06/01/2007    Cath 04/03/2008; Cath 11/01/1990  . Tachy-brady syndrome 07/13/2008    medtronic Adapta gen. model ADDR01 ,serial # [id number] H by Dr  Dallas Breeding at [facility]  . Peripheral artery disease 06/16/2010    LEA doppler-left ABI nml 0.61 calcified vessels;right ABI  0.68  . Edema, peripheral 07/30/2009    LEV doppler- no thrombus or thrombophlebitis  . Aortic valvular disorder 08/16/2012    echo EF 45-50% LV mildly reduce,Biocor mitral valve,mild to mod. tricuspid regrug.,mild to mod. aortic regrug.  . Mitral valve disorder 09/08/2011    echo EF 50-55% LV normal    Past Surgical History  Procedure Laterality Date  . Prostatectomy    . Tonsillectomy    . Cystectomy       Left leg  . Pacemaker placement  07/13/2008    Adapata dual chamber-Medtronic at [facility]   . Coronary artery bypass graft  06/13/2008    graft x1 w/vein graft LAD artery by Dr. Janey Genta at [facility]  . Mitral valve replacement  06/13/2008    #20 St. Jude Bicor mitral valve ;Maze procedure at [facility] by Dr. Janey Genta  . Cardiac catheterization  11/01/1990  . Skin cancer excision      removed from forehead and right leg, nose/face  . Upper gastrointestinal endoscopy  07/14/2009    erosive reflux esopagitis, Barrett's esophagus  . Colonoscopy  07/14/2009    diverticulosis, internal hemorrhoids  . Cardiovascular stress test  06/03/2010    Persantine perfusion EF 45% mild to moderate ischemia basal and mid inferolateral region  . Cardiovascular stress test  01/01/2008    left ventricle normal,no significant ischemia  . Cardiovascular stress test  09/09/2005     possibility of ischemia inferior wall    History   Social History  . Marital Status: Married    Spouse Name: N/A    Number of Children: 4  . Years of Education: N/A   Occupational History  . Retired    Social History Main Topics  . Smoking status: Former Smoker -- 2 years    Types: Pipe    Quit date: 11/07/1965  . Smokeless tobacco: Never Used     Comment: quit over 40 years ago, never smoked cigarettes  .  Alcohol Use: No  . Drug Use: No  . Sexually Active: Not on file   Other Topics Concern  . Not on file   Social History Narrative  . No narrative on file    Current Outpatient Prescriptions on File Prior to Visit  Medication Sig Dispense Refill  . amiodarone (PACERONE) 200 MG tablet Take 200 mg by mouth daily.       Marland Kitchen aspirin EC 81 MG tablet Take 81 mg by mouth daily.      . digoxin (LANOXIN) 0.125 MG tablet Take 0.125 mg by mouth daily.      . ferrous sulfate 325 (65 FE) MG tablet Take 325 mg by mouth daily with breakfast.      . methimazole (TAPAZOLE) 5 MG tablet Take 5 mg by mouth 3 (three) times a week. On Monday,  Wednesday and Friday      . methocarbamol (ROBAXIN) 500 MG tablet Take 1 tablet (500 mg total) by mouth 2 (two) times daily.  20 tablet  0  . metoprolol succinate (TOPROL-XL) 100 MG 24 hr tablet Take 1 tablet (100 mg total) by mouth daily. Take with or immediately following a meal.  90 tablet  3  . Multiple Vitamin (MULTIVITAMIN WITH MINERALS) TABS Take 1 tablet by mouth daily. Centrum Silver      . pantoprazole (PROTONIX) 40 MG tablet Take 1 tablet (40 mg total) by mouth 2 (two) times daily.  180 tablet  3  . simvastatin (ZOCOR) 40 MG tablet Take 1 tablet (40 mg total) by mouth at bedtime.  90 tablet  2  . Tamsulosin HCl (FLOMAX) 0.4 MG CAPS Take by mouth every evening.       . warfarin (COUMADIN) 3 MG tablet Take 3-4.5 mg by mouth every evening. 1 1/2 tabs (4.5 mg) on Monday, Wednesday and Friday, 1 tab (3 mg) on Tuesday, Thursday, Saturday and Sunday       No current facility-administered medications on file prior to visit.    No Known Allergies  Family History  Problem Relation Age of Onset  . Cancer Mother     Breast Cancer    BP 100/60  Pulse 91  Temp(Src) 97.4 F (36.3 C) (Oral)  Ht 5\' 10"  (1.778 m)  Wt 168 lb 4.8 oz (76.34 kg)  BMI 24.15 kg/m2  SpO2 95%  Review of Systems Denies fever    Objective:   Physical Exam VITAL SIGNS:  See vs page. GENERAL: no distress. NECK: There is no palpable thyroid enlargement.  No thyroid nodule is palpable.  No palpable lymphadenopathy at the anterior neck.  Lab Results  Component Value Date   WBC 5.7 05/28/2013   HGB 12.4* 05/28/2013   HCT 36.8* 05/28/2013   MCV 94.2 05/28/2013   PLT 141.0* 05/28/2013   Lab Results  Component Value Date   TSH 4.25 05/28/2013      Assessment & Plan:  Hyperthyroidism, well-controlled Anemia, stable.  We'll follow this.

## 2013-05-28 NOTE — Patient Instructions (Addendum)
Please come back for a regular physical appointment in 3 months (after 08/17/13). blood tests are being requested for you today.  We'll contact you with results. if ever you have fever while taking methimazole, stop it and call us, because of the risk of a rare side-effect.

## 2013-06-04 ENCOUNTER — Ambulatory Visit (INDEPENDENT_AMBULATORY_CARE_PROVIDER_SITE_OTHER): Payer: Medicare Other | Admitting: Pharmacist Clinician (PhC)/ Clinical Pharmacy Specialist

## 2013-06-04 DIAGNOSIS — Z7901 Long term (current) use of anticoagulants: Secondary | ICD-10-CM

## 2013-06-04 DIAGNOSIS — I4891 Unspecified atrial fibrillation: Secondary | ICD-10-CM

## 2013-06-04 DIAGNOSIS — Z8679 Personal history of other diseases of the circulatory system: Secondary | ICD-10-CM

## 2013-06-04 LAB — POCT INR: INR: 1.7

## 2013-06-25 ENCOUNTER — Ambulatory Visit: Payer: Medicare Other | Admitting: Pharmacist Clinician (PhC)/ Clinical Pharmacy Specialist

## 2013-06-26 ENCOUNTER — Ambulatory Visit (INDEPENDENT_AMBULATORY_CARE_PROVIDER_SITE_OTHER): Payer: Medicare Other | Admitting: Pharmacist Clinician (PhC)/ Clinical Pharmacy Specialist

## 2013-06-26 DIAGNOSIS — I4891 Unspecified atrial fibrillation: Secondary | ICD-10-CM

## 2013-06-26 DIAGNOSIS — Z7901 Long term (current) use of anticoagulants: Secondary | ICD-10-CM

## 2013-06-26 DIAGNOSIS — Z8679 Personal history of other diseases of the circulatory system: Secondary | ICD-10-CM

## 2013-06-26 LAB — POCT INR: INR: 3.3

## 2013-07-16 ENCOUNTER — Encounter: Payer: Self-pay | Admitting: Endocrinology

## 2013-07-16 ENCOUNTER — Ambulatory Visit (INDEPENDENT_AMBULATORY_CARE_PROVIDER_SITE_OTHER): Payer: Medicare Other | Admitting: Endocrinology

## 2013-07-16 VITALS — BP 122/70 | HR 75 | Ht 68.0 in | Wt 172.0 lb

## 2013-07-16 DIAGNOSIS — M79609 Pain in unspecified limb: Secondary | ICD-10-CM

## 2013-07-16 DIAGNOSIS — M79642 Pain in left hand: Secondary | ICD-10-CM

## 2013-07-16 DIAGNOSIS — Z8679 Personal history of other diseases of the circulatory system: Secondary | ICD-10-CM

## 2013-07-16 DIAGNOSIS — D509 Iron deficiency anemia, unspecified: Secondary | ICD-10-CM

## 2013-07-16 LAB — CBC WITH DIFFERENTIAL/PLATELET
Basophils Relative: 0.6 % (ref 0.0–3.0)
Eosinophils Relative: 3 % (ref 0.0–5.0)
HCT: 33.6 % — ABNORMAL LOW (ref 39.0–52.0)
MCV: 93.5 fl (ref 78.0–100.0)
Monocytes Absolute: 0.4 10*3/uL (ref 0.1–1.0)
Monocytes Relative: 8.2 % (ref 3.0–12.0)
Neutrophils Relative %: 64.2 % (ref 43.0–77.0)
RBC: 3.59 Mil/uL — ABNORMAL LOW (ref 4.22–5.81)
WBC: 5.2 10*3/uL (ref 4.5–10.5)

## 2013-07-16 MED ORDER — CEPHALEXIN 500 MG PO CAPS: 500.0000 mg | ORAL_CAPSULE | Freq: Three times a day (TID) | ORAL | Status: DC

## 2013-07-16 NOTE — Progress Notes (Signed)
Subjective:    Patient ID: [name] [name], male    DOB: October 26, 1929, 79 y.o.   MRN: [id number]  HPI Pt states 1 week of slight swelling of the left hand and wrist, and assoc pain Past Medical History  Diagnosis Date  . GASTRIC POLYP 05/13/2009  . COLONIC POLYPS, ADENOMATOUS 01/14/2008  . HYPERLIPIDEMIA 06/01/2007  . ANEMIA, IRON DEFICIENCY 10/13/2008  . COAGULOPATHY, COUMADIN-INDUCED 01/14/2008  . ANXIETY 09/04/2008  . HYPERTENSION 06/01/2007  . ATRIAL FIBRILLATION, CHRONIC 01/14/2008    on warfarin since 2007  . UNSPECIFIED VENOUS INSUFFICIENCY 09/18/2008  . PLEURAL EFFUSION, LEFT 10/13/2008  . GERD 06/01/2007  . BARRETTS ESOPHAGUS 03/20/2009  . PROSTATE CANCER, HX OF 01/14/2008  . CATARACTS, BILATERAL, HX OF 01/14/2008  . CEREBROVASCULAR ACCIDENT, HX OF 08/18/2008  . PERSONAL HX COLONIC POLYPS 01/01/2010  . BASAL CELL CARCINOMA OF SKIN SITE UNSPECIFIED 09/28/2010  . HYPERTHYROIDISM 01/06/2011  . Hyperglycemia   . Elevated PSA   . Restless leg syndrome   . Mitral regurgitation 06/13/2008    mitral valve replacement #20 St. Jude Biocor; Maze procedure by Dr. Janey Genta  at [facility]  . CORONARY ARTERY DISEASE 06/01/2007    Cath 04/03/2008; Cath 11/01/1990  . Tachy-brady syndrome 07/13/2008    medtronic Adapta gen. model ADDR01 ,serial # [id number] H by Dr  Dallas Breeding at [facility]  . Peripheral artery disease 06/16/2010    LEA doppler-left ABI nml 0.61 calcified vessels;right ABI  0.68  . Edema, peripheral 07/30/2009    LEV doppler- no thrombus or thrombophlebitis  . Aortic valvular disorder 08/16/2012    echo EF 45-50% LV mildly reduce,Biocor mitral valve,mild to mod. tricuspid regrug.,mild to mod. aortic regrug.  . Mitral valve disorder 09/08/2011    echo EF 50-55% LV normal    Past Surgical History  Procedure Laterality Date  . Prostatectomy    . Tonsillectomy    . Cystectomy      Left leg  . Pacemaker placement  07/13/2008    Adapata dual chamber-Medtronic at [facility]   . Coronary artery bypass  graft  06/13/2008    graft x1 w/vein graft LAD artery by Dr. Janey Genta at [facility]  . Mitral valve replacement  06/13/2008    #20 St. Jude Bicor mitral valve ;Maze procedure at [facility] by Dr. Janey Genta  . Cardiac catheterization  11/01/1990  . Skin cancer excision      removed from forehead and right leg, nose/face  . Upper gastrointestinal endoscopy  07/14/2009    erosive reflux esopagitis, Barrett's esophagus  . Colonoscopy  07/14/2009    diverticulosis, internal hemorrhoids  . Cardiovascular stress test  06/03/2010    Persantine perfusion EF 45% mild to moderate ischemia basal and mid inferolateral region  . Cardiovascular stress test  01/01/2008    left ventricle normal,no significant ischemia  . Cardiovascular stress test  09/09/2005     possibility of ischemia inferior wall    History   Social History  . Marital Status: Married    Spouse Name: N/A    Number of Children: 4  . Years of Education: N/A   Occupational History  . Retired    Social History Main Topics  . Smoking status: Former Smoker -- 2 years    Types: Pipe    Quit date: 11/07/1965  . Smokeless tobacco: Never Used     Comment: quit over 40 years ago, never smoked cigarettes  . Alcohol Use: No  . Drug Use: No  . Sexual Activity: Not on file  Other Topics Concern  . Not on file   Social History Narrative  . No narrative on file    Current Outpatient Prescriptions on File Prior to Visit  Medication Sig Dispense Refill  . amiodarone (PACERONE) 200 MG tablet Take 200 mg by mouth daily.       Marland Kitchen aspirin EC 81 MG tablet Take 81 mg by mouth daily.      . digoxin (LANOXIN) 0.125 MG tablet Take 0.125 mg by mouth daily.      . ferrous sulfate 325 (65 FE) MG tablet Take 325 mg by mouth daily with breakfast.      . methimazole (TAPAZOLE) 5 MG tablet Take 5 mg by mouth 3 (three) times a week. On Monday, Wednesday and Friday      . methocarbamol (ROBAXIN) 500 MG tablet Take 1 tablet (500 mg total) by mouth 2 (two) times  daily.  20 tablet  0  . metoprolol succinate (TOPROL-XL) 100 MG 24 hr tablet Take 1 tablet (100 mg total) by mouth daily. Take with or immediately following a meal.  90 tablet  3  . Multiple Vitamin (MULTIVITAMIN WITH MINERALS) TABS Take 1 tablet by mouth daily. Centrum Silver      . pantoprazole (PROTONIX) 40 MG tablet Take 1 tablet (40 mg total) by mouth 2 (two) times daily.  180 tablet  3  . simvastatin (ZOCOR) 40 MG tablet Take 1 tablet (40 mg total) by mouth at bedtime.  90 tablet  2  . Tamsulosin HCl (FLOMAX) 0.4 MG CAPS Take by mouth every evening.       . warfarin (COUMADIN) 3 MG tablet Take 3-4.5 mg by mouth every evening. 1 1/2 tabs (4.5 mg) on Monday, Wednesday and Friday, 1 tab (3 mg) on Tuesday, Thursday, Saturday and Sunday       No current facility-administered medications on file prior to visit.    No Known Allergies  Family History  Problem Relation Age of Onset  . Cancer Mother     Breast Cancer    BP 122/70  Pulse 75  Ht 5\' 8"  (1.727 m)  Wt 172 lb (78.019 kg)  BMI 26.16 kg/m2  SpO2 97%  Review of Systems He also has ecchymosis there.  Denies brbpr    Objective:   Physical Exam VITAL SIGNS:  See vs page GENERAL: no distress  both hands: ecchymoses at the dorsal aspects Left hand and wrist: slight swelling at the thenar aspect, and at the flexor aspect of the wrist. Ext: bilat leg edema.   Lab Results  Component Value Date   INR 2.2* 07/16/2013   INR 3.3 06/26/2013   INR 1.7 06/04/2013       Assessment & Plan:  Hand swelling, new, ? Early cellulitis Anticoagulation: INR is appropriate. CHF: seems well-controlled.  Does not seem to be the cause of hand swelling

## 2013-07-16 NOTE — Patient Instructions (Addendum)
blood tests are being requested for you today.  We'll contact you with results. i have sent a prescription to your pharmacy, for an antibiotic. I hope you feel better soon.  If you don't feel better by next week, please call back.  Please call sooner if you get worse.

## 2013-07-17 ENCOUNTER — Ambulatory Visit (INDEPENDENT_AMBULATORY_CARE_PROVIDER_SITE_OTHER): Payer: Medicare Other | Admitting: Pharmacist Clinician (PhC)/ Clinical Pharmacy Specialist

## 2013-07-17 VITALS — BP 120/68 | HR 80

## 2013-07-17 DIAGNOSIS — I4891 Unspecified atrial fibrillation: Secondary | ICD-10-CM

## 2013-07-17 DIAGNOSIS — Z8679 Personal history of other diseases of the circulatory system: Secondary | ICD-10-CM

## 2013-07-17 DIAGNOSIS — Z7901 Long term (current) use of anticoagulants: Secondary | ICD-10-CM

## 2013-07-25 ENCOUNTER — Other Ambulatory Visit: Payer: Self-pay

## 2013-07-25 LAB — PACEMAKER DEVICE OBSERVATION
ATRIAL PACING PM: 100
BATTERY VOLTAGE: 2.77 V
RV LEAD IMPEDENCE PM: 636 Ohm
VENTRICULAR PACING PM: 100

## 2013-07-26 ENCOUNTER — Other Ambulatory Visit: Payer: Self-pay | Admitting: Cardiovascular Disease

## 2013-07-26 LAB — LIPID PANEL
Cholesterol: 122 mg/dL (ref 0–200)
Triglycerides: 62 mg/dL (ref <150)

## 2013-07-26 LAB — COMPREHENSIVE METABOLIC PANEL
Albumin: 3.8 g/dL (ref 3.5–5.2)
BUN: 22 mg/dL (ref 6–23)
CO2: 30 mEq/L (ref 19–32)
Glucose, Bld: 90 mg/dL (ref 70–99)
Potassium: 4.2 mEq/L (ref 3.5–5.3)
Sodium: 143 mEq/L (ref 135–145)
Total Protein: 6.2 g/dL (ref 6.0–8.3)

## 2013-08-01 ENCOUNTER — Encounter: Payer: Self-pay | Admitting: Cardiovascular Disease

## 2013-08-02 ENCOUNTER — Encounter: Payer: Self-pay | Admitting: Cardiovascular Disease

## 2013-08-07 ENCOUNTER — Ambulatory Visit: Payer: Medicare Other | Admitting: Pharmacist Clinician (PhC)/ Clinical Pharmacy Specialist

## 2013-08-09 ENCOUNTER — Ambulatory Visit (INDEPENDENT_AMBULATORY_CARE_PROVIDER_SITE_OTHER): Payer: Medicare Other | Admitting: Pharmacist Clinician (PhC)/ Clinical Pharmacy Specialist

## 2013-08-09 VITALS — BP 130/60 | HR 88

## 2013-08-09 DIAGNOSIS — Z7901 Long term (current) use of anticoagulants: Secondary | ICD-10-CM

## 2013-08-09 DIAGNOSIS — I4891 Unspecified atrial fibrillation: Secondary | ICD-10-CM

## 2013-08-09 DIAGNOSIS — Z8679 Personal history of other diseases of the circulatory system: Secondary | ICD-10-CM

## 2013-08-15 ENCOUNTER — Other Ambulatory Visit (HOSPITAL_COMMUNITY): Payer: Self-pay | Admitting: Internal Medicine

## 2013-08-15 DIAGNOSIS — Z8249 Family history of ischemic heart disease and other diseases of the circulatory system: Secondary | ICD-10-CM

## 2013-08-20 ENCOUNTER — Other Ambulatory Visit (HOSPITAL_COMMUNITY): Payer: Self-pay | Admitting: Cardiovascular Disease

## 2013-08-20 ENCOUNTER — Other Ambulatory Visit: Payer: Self-pay | Admitting: *Deleted

## 2013-08-20 ENCOUNTER — Ambulatory Visit (HOSPITAL_COMMUNITY)
Admission: RE | Admit: 2013-08-20 | Discharge: 2013-08-20 | Disposition: A | Discharge status: Home / Self Care | Payer: Medicare Other | Source: Ambulatory Visit | Attending: Cardiology | Admitting: Cardiology

## 2013-08-20 DIAGNOSIS — I359 Nonrheumatic aortic valve disorder, unspecified: Secondary | ICD-10-CM

## 2013-08-20 DIAGNOSIS — I350 Nonrheumatic aortic (valve) stenosis: Secondary | ICD-10-CM

## 2013-08-20 NOTE — Progress Notes (Signed)
2D Echo Performed 08/20/2013    Glory Graefe, RCS  

## 2013-08-21 NOTE — Progress Notes (Signed)
LMTC for echo results. 

## 2013-08-21 NOTE — Progress Notes (Signed)
LMTC for Echo results. 

## 2013-08-28 ENCOUNTER — Ambulatory Visit: Payer: Medicare Other | Admitting: Endocrinology

## 2013-08-28 DIAGNOSIS — Z0289 Encounter for other administrative examinations: Secondary | ICD-10-CM

## 2013-08-30 ENCOUNTER — Encounter: Payer: Self-pay | Admitting: Critical Care Medicine

## 2013-08-30 ENCOUNTER — Ambulatory Visit (INDEPENDENT_AMBULATORY_CARE_PROVIDER_SITE_OTHER): Payer: Medicare Other | Admitting: Critical Care Medicine

## 2013-08-30 ENCOUNTER — Ambulatory Visit (INDEPENDENT_AMBULATORY_CARE_PROVIDER_SITE_OTHER): Payer: Medicare Other

## 2013-08-30 VITALS — BP 130/70 | HR 82 | Temp 97.5°F | Ht 70.0 in | Wt 181.2 lb

## 2013-08-30 DIAGNOSIS — Z23 Encounter for immunization: Secondary | ICD-10-CM

## 2013-08-30 DIAGNOSIS — J984 Other disorders of lung: Secondary | ICD-10-CM

## 2013-08-30 NOTE — Patient Instructions (Signed)
No change in medications. Return as needed 

## 2013-08-30 NOTE — Progress Notes (Signed)
Subjective:    Patient ID: [name] [name], male    DOB: [birth year], 79 y.o.   MRN: [id number]  HPI  08/30/2013 Chief Complaint  Patient presents with  . Follow-up    Pt has no complaints.  No congestion, wheezing, cough.  No new issues. No recurrence of pneumonias. Pt had flu vaccine already. On no inhaled meds now.     Past Medical History  Diagnosis Date  . GASTRIC POLYP 05/13/2009  . COLONIC POLYPS, ADENOMATOUS 01/14/2008  . HYPERLIPIDEMIA 06/01/2007  . ANEMIA, IRON DEFICIENCY 10/13/2008  . COAGULOPATHY, COUMADIN-INDUCED 01/14/2008  . ANXIETY 09/04/2008  . HYPERTENSION 06/01/2007  . ATRIAL FIBRILLATION, CHRONIC 01/14/2008    on warfarin since 2007  . UNSPECIFIED VENOUS INSUFFICIENCY 09/18/2008  . PLEURAL EFFUSION, LEFT 10/13/2008  . GERD 06/01/2007  . BARRETTS ESOPHAGUS 03/20/2009  . PROSTATE CANCER, HX OF 01/14/2008  . CATARACTS, BILATERAL, HX OF 01/14/2008  . CEREBROVASCULAR ACCIDENT, HX OF 08/18/2008  . PERSONAL HX COLONIC POLYPS 01/01/2010  . BASAL CELL CARCINOMA OF SKIN SITE UNSPECIFIED 09/28/2010  . HYPERTHYROIDISM 01/06/2011  . Hyperglycemia   . Elevated PSA   . Restless leg syndrome   . Mitral regurgitation 06/13/2008    mitral valve replacement #20 St. Jude Biocor; Maze procedure by Dr. Janey Genta  at [facility]  . CORONARY ARTERY DISEASE 06/01/2007    Cath 04/03/2008; Cath 11/01/1990  . Tachy-brady syndrome 07/13/2008    medtronic Adapta gen. model ADDR01 ,serial # [id number] H by Dr  Dallas Breeding at [facility]  . Peripheral artery disease 06/16/2010    LEA doppler-left ABI nml 0.61 calcified vessels;right ABI  0.68  . Edema, peripheral 07/30/2009    LEV doppler- no thrombus or thrombophlebitis  . Aortic valvular disorder 08/16/2012    echo EF 45-50% LV mildly reduce,Biocor mitral valve,mild to mod. tricuspid regrug.,mild to mod. aortic regrug.  . Mitral valve disorder 09/08/2011    echo EF 50-55% LV normal     Family History  Problem Relation Age of Onset  . Cancer Mother    Breast Cancer     History   Social History  . Marital Status: Married    Spouse Name: N/A    Number of Children: 4  . Years of Education: N/A   Occupational History  . Retired    Social History Main Topics  . Smoking status: Former Smoker -- 2 years    Types: Pipe    Quit date: 11/07/1965  . Smokeless tobacco: Never Used     Comment: quit over 40 years ago, never smoked cigarettes  . Alcohol Use: No  . Drug Use: No  . Sexual Activity: Not on file   Other Topics Concern  . Not on file   Social History Narrative  . No narrative on file     No Known Allergies   Outpatient Prescriptions Prior to Visit  Medication Sig Dispense Refill  . amiodarone (PACERONE) 200 MG tablet Take 200 mg by mouth daily.       Marland Kitchen aspirin EC 81 MG tablet Take 81 mg by mouth daily.      . digoxin (LANOXIN) 0.125 MG tablet Take 0.125 mg by mouth daily.      . ferrous sulfate 325 (65 FE) MG tablet Take 325 mg by mouth daily with breakfast.      . methimazole (TAPAZOLE) 5 MG tablet Take 5 mg by mouth 3 (three) times a week. On Monday, Wednesday and Friday      . metoprolol succinate (TOPROL-XL)  100 MG 24 hr tablet Take 1 tablet (100 mg total) by mouth daily. Take with or immediately following a meal.  90 tablet  3  . Multiple Vitamin (MULTIVITAMIN WITH MINERALS) TABS Take 1 tablet by mouth daily. Centrum Silver      . pantoprazole (PROTONIX) 40 MG tablet Take 1 tablet (40 mg total) by mouth 2 (two) times daily.  180 tablet  3  . simvastatin (ZOCOR) 40 MG tablet Take 1 tablet (40 mg total) by mouth at bedtime.  90 tablet  2  . Tamsulosin HCl (FLOMAX) 0.4 MG CAPS Take by mouth every evening.       . warfarin (COUMADIN) 3 MG tablet Take 3-4.5 mg by mouth every evening. 1 1/2 tabs (4.5 mg) on Monday, Wednesday and Friday, 1 tab (3 mg) on Tuesday, Thursday, Saturday and Sunday      . cephALEXin (KEFLEX) 500 MG capsule Take 1 capsule (500 mg total) by mouth 3 (three) times daily.  21 capsule  0  .  methocarbamol (ROBAXIN) 500 MG tablet Take 1 tablet (500 mg total) by mouth 2 (two) times daily.  20 tablet  0   No facility-administered medications prior to visit.      Review of Systems  Constitutional: Positive for activity change, fatigue and unexpected weight change. Negative for chills, diaphoresis and appetite change.  HENT: Positive for voice change. Negative for congestion, dental problem, ear discharge, facial swelling, hearing loss, mouth sores, nosebleeds, postnasal drip, sinus pressure, sneezing, tinnitus and trouble swallowing.   Eyes: Negative for photophobia, discharge, itching and visual disturbance.  Respiratory: Negative for apnea, cough, choking, chest tightness and stridor.   Cardiovascular: Negative for palpitations.  Gastrointestinal: Positive for constipation. Negative for nausea, blood in stool and abdominal distention.  Genitourinary: Positive for dysuria, frequency and difficulty urinating. Negative for urgency, hematuria, flank pain and decreased urine volume.  Musculoskeletal: Negative for arthralgias, back pain, gait problem, joint swelling, myalgias and neck stiffness.  Skin: Negative for color change and pallor.  Neurological: Negative for dizziness, tremors, seizures, syncope, speech difficulty, weakness, light-headedness and numbness.  Hematological: Negative for adenopathy. Does not bruise/bleed easily.  Psychiatric/Behavioral: Negative for confusion, sleep disturbance and agitation. The patient is not nervous/anxious.        Objective:   Physical Exam  Filed Vitals:   08/30/13 1622  BP: 130/70  Pulse: 82  Temp: 97.5 F (36.4 C)  TempSrc: Oral  Height: 5\' 10"  (1.778 m)  Weight: 181 lb 3.2 oz (82.192 kg)  SpO2: 97%    Gen: Pleasant, well-nourished, in no distress,  normal affect  ENT: No lesions,  mouth clear,  oropharynx clear, no postnasal drip  Neck: No JVD, no TMG, no carotid bruits  Lungs: No use of accessory muscles, no dullness to  percussion Cardiovascular: RRR, heart sounds normal, no murmur or gallops, 1+ peripheral edema  Abdomen: soft and NT, no HSM,  BS normal  Musculoskeletal: No deformities, no cyanosis or clubbing  Neuro: alert, non focal  Skin: Warm, no lesions or rashes   No results found.    Assessment & Plan:   Restrictive lung disease Restrictive lung disease on the basis of significant kyphotic changes and chronic scar and pleural space from prior pneumonia but no evidence of active pneumonitis or pleural effusions Plan Expectant observation for now Return to pulmonary as needed    Updated Medication List Outpatient Encounter Prescriptions as of 08/30/2013  Medication Sig Dispense Refill  . amiodarone (PACERONE) 200 MG tablet Take 200  mg by mouth daily.       Marland Kitchen aspirin EC 81 MG tablet Take 81 mg by mouth daily.      . digoxin (LANOXIN) 0.125 MG tablet Take 0.125 mg by mouth daily.      . ferrous sulfate 325 (65 FE) MG tablet Take 325 mg by mouth daily with breakfast.      . methimazole (TAPAZOLE) 5 MG tablet Take 5 mg by mouth 3 (three) times a week. On Monday, Wednesday and Friday      . metoprolol succinate (TOPROL-XL) 100 MG 24 hr tablet Take 1 tablet (100 mg total) by mouth daily. Take with or immediately following a meal.  90 tablet  3  . Multiple Vitamin (MULTIVITAMIN WITH MINERALS) TABS Take 1 tablet by mouth daily. Centrum Silver      . pantoprazole (PROTONIX) 40 MG tablet Take 1 tablet (40 mg total) by mouth 2 (two) times daily.  180 tablet  3  . simvastatin (ZOCOR) 40 MG tablet Take 1 tablet (40 mg total) by mouth at bedtime.  90 tablet  2  . Tamsulosin HCl (FLOMAX) 0.4 MG CAPS Take by mouth every evening.       . warfarin (COUMADIN) 3 MG tablet Take 3-4.5 mg by mouth every evening. 1 1/2 tabs (4.5 mg) on Monday, Wednesday and Friday, 1 tab (3 mg) on Tuesday, Thursday, Saturday and Sunday      . [DISCONTINUED] cephALEXin (KEFLEX) 500 MG capsule Take 1 capsule (500 mg total) by  mouth 3 (three) times daily.  21 capsule  0  . [DISCONTINUED] methocarbamol (ROBAXIN) 500 MG tablet Take 1 tablet (500 mg total) by mouth 2 (two) times daily.  20 tablet  0   No facility-administered encounter medications on file as of 08/30/2013.

## 2013-08-31 NOTE — Assessment & Plan Note (Signed)
Restrictive lung disease on the basis of significant kyphotic changes and chronic scar and pleural space from prior pneumonia but no evidence of active pneumonitis or pleural effusions Plan Expectant observation for now Return to pulmonary as needed

## 2013-09-10 ENCOUNTER — Telehealth: Payer: Self-pay | Admitting: Internal Medicine

## 2013-09-10 ENCOUNTER — Ambulatory Visit: Payer: Medicare Other | Admitting: Pharmacist Clinician (PhC)/ Clinical Pharmacy Specialist

## 2013-09-10 NOTE — Telephone Encounter (Signed)
Recall for Dr C in Jan/mt

## 2013-09-11 ENCOUNTER — Ambulatory Visit (INDEPENDENT_AMBULATORY_CARE_PROVIDER_SITE_OTHER): Payer: Medicare Other | Admitting: Pharmacist Clinician (PhC)/ Clinical Pharmacy Specialist

## 2013-09-11 VITALS — BP 118/78 | HR 84

## 2013-09-11 DIAGNOSIS — Z7901 Long term (current) use of anticoagulants: Secondary | ICD-10-CM

## 2013-09-11 DIAGNOSIS — I4891 Unspecified atrial fibrillation: Secondary | ICD-10-CM

## 2013-09-11 DIAGNOSIS — Z8679 Personal history of other diseases of the circulatory system: Secondary | ICD-10-CM

## 2013-09-11 LAB — POCT INR: INR: 2.3

## 2013-10-09 ENCOUNTER — Ambulatory Visit (INDEPENDENT_AMBULATORY_CARE_PROVIDER_SITE_OTHER): Payer: Medicare Other | Admitting: Pharmacist Clinician (PhC)/ Clinical Pharmacy Specialist

## 2013-10-09 VITALS — BP 122/84 | HR 72

## 2013-10-09 DIAGNOSIS — Z7901 Long term (current) use of anticoagulants: Secondary | ICD-10-CM

## 2013-10-09 DIAGNOSIS — Z8679 Personal history of other diseases of the circulatory system: Secondary | ICD-10-CM

## 2013-10-09 DIAGNOSIS — I4891 Unspecified atrial fibrillation: Secondary | ICD-10-CM

## 2013-10-09 LAB — POCT INR: INR: 1.6

## 2013-10-10 ENCOUNTER — Other Ambulatory Visit: Payer: Self-pay

## 2013-10-10 ENCOUNTER — Other Ambulatory Visit: Payer: Self-pay | Admitting: *Deleted

## 2013-10-10 MED ORDER — METHIMAZOLE 5 MG PO TABS: 5.0000 mg | ORAL_TABLET | ORAL | Status: DC

## 2013-10-10 MED ORDER — DIGOXIN 125 MCG PO TABS: 0.1250 mg | ORAL_TABLET | Freq: Every day | ORAL | Status: DC

## 2013-10-10 NOTE — Telephone Encounter (Signed)
Methimazole

## 2013-10-10 NOTE — Telephone Encounter (Signed)
Rx was sent to pharmacy electronically. 

## 2013-10-23 ENCOUNTER — Ambulatory Visit (INDEPENDENT_AMBULATORY_CARE_PROVIDER_SITE_OTHER): Payer: Medicare Other | Admitting: Pharmacist Clinician (PhC)/ Clinical Pharmacy Specialist

## 2013-10-23 VITALS — BP 118/70 | HR 84

## 2013-10-23 DIAGNOSIS — Z7901 Long term (current) use of anticoagulants: Secondary | ICD-10-CM

## 2013-10-23 DIAGNOSIS — I4891 Unspecified atrial fibrillation: Secondary | ICD-10-CM

## 2013-10-23 DIAGNOSIS — Z8679 Personal history of other diseases of the circulatory system: Secondary | ICD-10-CM

## 2013-11-04 ENCOUNTER — Other Ambulatory Visit: Payer: Self-pay | Admitting: *Deleted

## 2013-11-04 MED ORDER — AMIODARONE HCL 200 MG PO TABS: 200.0000 mg | ORAL_TABLET | Freq: Every day | ORAL | Status: DC

## 2013-11-04 NOTE — Telephone Encounter (Signed)
Rx was sent to pharmacy electronically. 

## 2013-11-08 ENCOUNTER — Other Ambulatory Visit: Payer: Self-pay

## 2013-11-08 MED ORDER — PANTOPRAZOLE SODIUM 40 MG PO TBEC
40.0000 mg | DELAYED_RELEASE_TABLET | Freq: Two times a day (BID) | ORAL | Status: DC
Start: 2013-11-08 — End: 2013-12-26

## 2013-11-08 NOTE — Telephone Encounter (Signed)
Spoke to patient and made him appointment for Feb. and then sent in refill for pantoprazole to [facility] as requested.

## 2013-11-14 ENCOUNTER — Emergency Department (INDEPENDENT_AMBULATORY_CARE_PROVIDER_SITE_OTHER)
Admission: EM | Admit: 2013-11-14 | Discharge: 2013-11-14 | Disposition: A | Discharge status: Home / Self Care | Payer: Medicare Other | Source: Home / Self Care | Attending: Family Medicine | Admitting: Family Medicine

## 2013-11-14 ENCOUNTER — Encounter (HOSPITAL_COMMUNITY): Payer: Self-pay | Admitting: Emergency Medicine

## 2013-11-14 DIAGNOSIS — S81009A Unspecified open wound, unspecified knee, initial encounter: Secondary | ICD-10-CM

## 2013-11-14 DIAGNOSIS — S81811A Laceration without foreign body, right lower leg, initial encounter: Secondary | ICD-10-CM

## 2013-11-14 DIAGNOSIS — IMO0002 Reserved for concepts with insufficient information to code with codable children: Secondary | ICD-10-CM

## 2013-11-14 DIAGNOSIS — S91009A Unspecified open wound, unspecified ankle, initial encounter: Secondary | ICD-10-CM

## 2013-11-14 DIAGNOSIS — S81809A Unspecified open wound, unspecified lower leg, initial encounter: Secondary | ICD-10-CM

## 2013-11-14 MED ORDER — TETANUS-DIPHTH-ACELL PERTUSSIS 5-2.5-18.5 LF-MCG/0.5 IM SUSP
0.5000 mL | Freq: Once | INTRAMUSCULAR | Status: AC
  Administered 2013-11-14: 0.5 mL via INTRAMUSCULAR

## 2013-11-14 NOTE — ED Notes (Signed)
Reports scraping right lower leg at a church/funeral service.  Service started around 3pm today, skin tear occurred going into service.  Someone at church applied dressing.  On arrival to ucc, dressing removed, bleeding controlled.  "L" shaped laceration

## 2013-11-14 NOTE — ED Provider Notes (Signed)
CSN: [id number]     Arrival date & time 11/14/13  1649 History   First MD Initiated Contact with Patient 11/14/13 1703     Chief Complaint  Patient presents with  . Extremity Laceration   (Consider location/radiation/quality/duration/timing/severity/associated sxs/prior Treatment) Patient is a 80 y.o. male presenting with skin laceration. The history is provided by the patient and the spouse.  Laceration Location:  Leg Leg laceration location:  R lower leg Length (cm):  4 Depth:  Cutaneous Quality: jagged   Bleeding: controlled   Time since incident:  3 hours Laceration mechanism:  Blunt object (scraped leg against brick wall , did not tear pants, ie blunt tear.) Pain details:    Severity:  Mild   Progression:  Unchanged Foreign body present:  No foreign bodies Worsened by:  Nothing tried Ineffective treatments:  None tried Tetanus status:  Out of date   Past Medical History  Diagnosis Date  . GASTRIC POLYP 05/13/2009  . COLONIC POLYPS, ADENOMATOUS 01/14/2008  . HYPERLIPIDEMIA 06/01/2007  . ANEMIA, IRON DEFICIENCY 10/13/2008  . COAGULOPATHY, COUMADIN-INDUCED 01/14/2008  . ANXIETY 09/04/2008  . HYPERTENSION 06/01/2007  . ATRIAL FIBRILLATION, CHRONIC 01/14/2008    on warfarin since 2007  . UNSPECIFIED VENOUS INSUFFICIENCY 09/18/2008  . PLEURAL EFFUSION, LEFT 10/13/2008  . GERD 06/01/2007  . BARRETTS ESOPHAGUS 03/20/2009  . PROSTATE CANCER, HX OF 01/14/2008  . CATARACTS, BILATERAL, HX OF 01/14/2008  . CEREBROVASCULAR ACCIDENT, HX OF 08/18/2008  . PERSONAL HX COLONIC POLYPS 01/01/2010  . BASAL CELL CARCINOMA OF SKIN SITE UNSPECIFIED 09/28/2010  . HYPERTHYROIDISM 01/06/2011  . Hyperglycemia   . Elevated PSA   . Restless leg syndrome   . Mitral regurgitation 06/13/2008    mitral valve replacement #20 St. Jude Biocor; Maze procedure by Dr. Amador Cunas  at [facility]  . CORONARY ARTERY DISEASE 06/01/2007    Cath 04/03/2008; Cath 11/01/1990  . Tachy-brady syndrome 07/13/2008    medtronic Adapta gen.  model ADDR01 ,serial # [id number] H by Dr  [name] at [facility]  . Peripheral artery disease 06/16/2010    LEA doppler-left ABI nml 0.61 calcified vessels;right ABI  0.68  . Edema, peripheral 07/30/2009    LEV doppler- no thrombus or thrombophlebitis  . Aortic valvular disorder 08/16/2012    echo EF 45-50% LV mildly reduce,Biocor mitral valve,mild to mod. tricuspid regrug.,mild to mod. aortic regrug.  . Mitral valve disorder 09/08/2011    echo EF 50-55% LV normal   Past Surgical History  Procedure Laterality Date  . Prostatectomy    . Tonsillectomy    . Cystectomy      Left leg  . Pacemaker placement  07/13/2008    [place] dual chamber-Medtronic at [facility]   . Coronary artery bypass graft  06/13/2008    graft x1 w/vein graft LAD artery by Dr. Amador Cunas at [facility]  . Mitral valve replacement  06/13/2008    #20 St. Jude Bicor mitral valve ;Maze procedure at [facility] by Dr. Amador Cunas  . Cardiac catheterization  11/01/1990  . Skin cancer excision      removed from forehead and right leg, nose/face  . Upper gastrointestinal endoscopy  07/14/2009    erosive reflux esopagitis, Barrett's esophagus  . Colonoscopy  07/14/2009    diverticulosis, internal hemorrhoids  . Cardiovascular stress test  06/03/2010    Persantine perfusion EF 45% mild to moderate ischemia basal and mid inferolateral region  . Cardiovascular stress test  01/01/2008    left ventricle normal,no significant ischemia  . Cardiovascular stress test  09/09/2005     possibility of ischemia inferior wall   Family History  Problem Relation Age of Onset  . Cancer Mother     Breast Cancer   History  Substance Use Topics  . Smoking status: Former Smoker -- 2 years    Types: Pipe    Quit date: 11/07/1965  . Smokeless tobacco: Never Used     Comment: quit over 40 years ago, never smoked cigarettes  . Alcohol Use: No    Review of Systems  Constitutional: Negative.   Musculoskeletal: Negative.   Skin: Positive for wound. Negative for  rash.    Allergies  Review of patient's allergies indicates no known allergies.  Home Medications   Current Outpatient Rx  Name  Route  Sig  Dispense  Refill  . amiodarone (PACERONE) 200 MG tablet   Oral   Take 1 tablet (200 mg total) by mouth daily.   90 tablet   1   . aspirin EC 81 MG tablet   Oral   Take 81 mg by mouth daily.         . digoxin (LANOXIN) 0.125 MG tablet   Oral   Take 1 tablet (0.125 mg total) by mouth daily.   90 tablet   0   . ferrous sulfate 325 (65 FE) MG tablet   Oral   Take 325 mg by mouth daily with breakfast.         . methimazole (TAPAZOLE) 5 MG tablet   Oral   Take 1 tablet (5 mg total) by mouth 3 (three) times a week. On Monday, Wednesday and Friday   90 tablet   0   . metoprolol succinate (TOPROL-XL) 100 MG 24 hr tablet   Oral   Take 1 tablet (100 mg total) by mouth daily. Take with or immediately following a meal.   90 tablet   3   . Multiple Vitamin (MULTIVITAMIN WITH MINERALS) TABS   Oral   Take 1 tablet by mouth daily. Centrum Silver         . pantoprazole (PROTONIX) 40 MG tablet   Oral   Take 1 tablet (40 mg total) by mouth 2 (two) times daily.   180 tablet   0     Please keep appointment 12/10/13 at 10:45 am   . simvastatin (ZOCOR) 40 MG tablet   Oral   Take 1 tablet (40 mg total) by mouth at bedtime.   90 tablet   2   . Tamsulosin HCl (FLOMAX) 0.4 MG CAPS   Oral   Take by mouth every evening.          . warfarin (COUMADIN) 3 MG tablet   Oral   Take 3-4.5 mg by mouth every evening. 1 1/2 tabs (4.5 mg) on Monday, Wednesday and Friday, 1 tab (3 mg) on Tuesday, Thursday, Saturday and Sunday          BP 134/96  Pulse 91  Temp(Src) 97.5 F (36.4 C) (Axillary)  Resp 18  SpO2 99% Physical Exam  Nursing note and vitals reviewed. Constitutional: He is oriented to person, place, and time. He appears well-developed and well-nourished.  Musculoskeletal: He exhibits tenderness.       Legs: Neurological: He  is alert and oriented to person, place, and time.  Skin: Skin is warm and dry.    ED Course  Debridement Date/Time: 11/14/2013 5:50 PM Performed by: Billy Fischer Authorized by: Ihor Gully D Consent: Verbal consent obtained. Risks and benefits: risks, benefits  and alternatives were discussed Consent given by: patient Preparation: Patient was prepped and draped in the usual sterile fashion. Local anesthesia used: yes Local anesthetic: lidocaine 2% without epinephrine Patient sedated: no Patient tolerance: Patient tolerated the procedure well with no immediate complications. Comments: Xeroform dsg.   (including critical care time) Labs Review Labs Reviewed - No data to display Imaging Review No results found.  EKG Interpretation    Date/Time:    Ventricular Rate:    PR Interval:    QRS Duration:   QT Interval:    QTC Calculation:   R Axis:     Text Interpretation:              MDM  Wound care.    Billy Fischer, MD 11/14/13 1754

## 2013-11-14 NOTE — Discharge Instructions (Signed)
Leave bandaged until sat then use polysporin oint twice a day and cover as needed until healed, return if any concerns.

## 2013-11-19 ENCOUNTER — Encounter: Payer: Self-pay | Admitting: Cardiovascular Disease

## 2013-11-19 ENCOUNTER — Ambulatory Visit (INDEPENDENT_AMBULATORY_CARE_PROVIDER_SITE_OTHER): Payer: Medicare Other | Admitting: Cardiovascular Disease

## 2013-11-19 ENCOUNTER — Ambulatory Visit (INDEPENDENT_AMBULATORY_CARE_PROVIDER_SITE_OTHER): Payer: Medicare Other | Admitting: Pharmacist Clinician (PhC)/ Clinical Pharmacy Specialist

## 2013-11-19 VITALS — BP 104/68 | HR 73 | Ht 70.0 in | Wt 171.6 lb

## 2013-11-19 DIAGNOSIS — I359 Nonrheumatic aortic valve disorder, unspecified: Secondary | ICD-10-CM

## 2013-11-19 DIAGNOSIS — I4891 Unspecified atrial fibrillation: Secondary | ICD-10-CM

## 2013-11-19 DIAGNOSIS — Z8679 Personal history of other diseases of the circulatory system: Secondary | ICD-10-CM

## 2013-11-19 DIAGNOSIS — I2510 Atherosclerotic heart disease of native coronary artery without angina pectoris: Secondary | ICD-10-CM

## 2013-11-19 DIAGNOSIS — I10 Essential (primary) hypertension: Secondary | ICD-10-CM

## 2013-11-19 DIAGNOSIS — E0590 Thyrotoxicosis, unspecified without thyrotoxic crisis or storm: Secondary | ICD-10-CM

## 2013-11-19 DIAGNOSIS — Z79899 Other long term (current) drug therapy: Secondary | ICD-10-CM

## 2013-11-19 DIAGNOSIS — E782 Mixed hyperlipidemia: Secondary | ICD-10-CM

## 2013-11-19 DIAGNOSIS — Z950 Presence of cardiac pacemaker: Secondary | ICD-10-CM | POA: Insufficient documentation

## 2013-11-19 DIAGNOSIS — Z7901 Long term (current) use of anticoagulants: Secondary | ICD-10-CM

## 2013-11-19 DIAGNOSIS — I2589 Other forms of chronic ischemic heart disease: Secondary | ICD-10-CM

## 2013-11-19 DIAGNOSIS — I350 Nonrheumatic aortic (valve) stenosis: Secondary | ICD-10-CM

## 2013-11-19 DIAGNOSIS — I255 Ischemic cardiomyopathy: Secondary | ICD-10-CM

## 2013-11-19 DIAGNOSIS — E785 Hyperlipidemia, unspecified: Secondary | ICD-10-CM

## 2013-11-19 HISTORY — DX: Ischemic cardiomyopathy: I25.5

## 2013-11-19 HISTORY — DX: Nonrheumatic aortic (valve) stenosis: I35.0

## 2013-11-19 LAB — PACEMAKER DEVICE OBSERVATION

## 2013-11-19 LAB — POCT INR: INR: 1.7

## 2013-11-19 NOTE — Assessment & Plan Note (Signed)
Despite mildly depressed LVEF of 45-50% he has never had manifestations of clinical heart failure. The presumption was that his mother Dalbert Garnet for secondary to pacing induced asynchrony but his most recent echo showed inferolateral hypokinesis suggestive of ischemic heart disease. He does not have accompanying angina. He is on appropriate treatment with a beta blocker and his blood pressure and aortic stenosis will not permit ACE inhibitor/angiotensin receptor blocker therapy. NYHA functional class I. Clinically euvolemic.

## 2013-11-19 NOTE — Progress Notes (Signed)
Patient ID: [name] [name], male   DOB: June 02, 1929, 80 y.o.   MRN: [id number]     Reason for office visit Valvular heart disease, coronary disease, paroxysmal atrial fibrillation, pacemaker followup  As the first time I have met Mr. Helming who is a former patient of Dr. Terance Ice.  His cardiac problems date back to 2009 when he was diagnosed with severe mitral insufficiency related to mitral valve prolapse. He was also having problems with paroxysmal atrial fibrillation. Coronary angiography demonstrated moderate coronary artery disease primarily a roughly 70% calcified eccentric proximal LAD lesion with moderate stenoses in the other 2 major coronary arteries. He was referred for robotic mitral valve repair by Dr. Amador Cunas at [facility]. The LIMA was found to be a very small vessel. He received a saphenous vein graft bypass to the LAD. Cryoablation was performed for atrial fibrillation. The mitral valve was replaced with a 29 mm St. Jude Biocor prosthesis. The postoperative course was complicated by an embolic stroke from which she has recovered without sequelae. He received a dual-chamber permanent pacemaker (Medtronic adapta) and is pacemaker dependent with 100% pacing in both the atrium and the ventricle.  He has remained on amiodarone therapy with an extremely low burden of atrial fibrillation. As far as I can tell from the record the last documentation of atrial fibrillation was a one-hour episode that occurred in May of 2012. He also remains on warfarin therapy.  Subsequently has developed progressive aortic valve disease and has at least moderate aortic stenosis with mild to moderate aortic insufficiency. This is currently asymptomatic. There is some discrepancy between the estimated aortic valve area at 0.7 cm and the aortic gradients (mean 29 mm Hg). Biological mitral valve prosthesis shows mild stenosis with a mean gradient of 7 mm Hg. He has a severely dilated left atrium and mild tricuspid  insufficiency.  Never had manifestations of heart failure. Left ventricular systolic function is mildly depressed with an EF of 45-50% primarily due to RV pacing induced asynchrony.  He has mild hyperthyroidism that is well controlled on low dose of methimazole. I suspect this is a meal related   No Known Allergies  Current Outpatient Prescriptions  Medication Sig Dispense Refill  . amiodarone (PACERONE) 200 MG tablet Take 1 tablet (200 mg total) by mouth daily.  90 tablet  1  . aspirin EC 81 MG tablet Take 81 mg by mouth daily.      . digoxin (LANOXIN) 0.125 MG tablet Take 1 tablet (0.125 mg total) by mouth daily.  90 tablet  0  . ferrous sulfate 325 (65 FE) MG tablet Take 325 mg by mouth daily with breakfast.      . methimazole (TAPAZOLE) 5 MG tablet Take 1 tablet (5 mg total) by mouth 3 (three) times a week. On Monday, Wednesday and Friday  90 tablet  0  . metoprolol succinate (TOPROL-XL) 100 MG 24 hr tablet Take 1 tablet (100 mg total) by mouth daily. Take with or immediately following a meal.  90 tablet  3  . Multiple Vitamin (MULTIVITAMIN WITH MINERALS) TABS Take 1 tablet by mouth daily. Centrum Silver      . pantoprazole (PROTONIX) 40 MG tablet Take 1 tablet (40 mg total) by mouth 2 (two) times daily.  180 tablet  0  . simvastatin (ZOCOR) 40 MG tablet Take 1 tablet (40 mg total) by mouth at bedtime.  90 tablet  2  . Tamsulosin HCl (FLOMAX) 0.4 MG CAPS Take by mouth every evening.       Marland Kitchen  warfarin (COUMADIN) 3 MG tablet Take 3-4.5 mg by mouth every evening. 1 1/2 tabs (4.5 mg) on Monday, Wednesday and Friday, 1 tab (3 mg) on Tuesday, Thursday, Saturday and Sunday       No current facility-administered medications for this visit.    Past Medical History  Diagnosis Date  . GASTRIC POLYP 05/13/2009  . COLONIC POLYPS, ADENOMATOUS 01/14/2008  . HYPERLIPIDEMIA 06/01/2007  . ANEMIA, IRON DEFICIENCY 10/13/2008  . COAGULOPATHY, COUMADIN-INDUCED 01/14/2008  . ANXIETY 09/04/2008  . HYPERTENSION  06/01/2007  . ATRIAL FIBRILLATION, CHRONIC 01/14/2008    on warfarin since 2007  . UNSPECIFIED VENOUS INSUFFICIENCY 09/18/2008  . PLEURAL EFFUSION, LEFT 10/13/2008  . GERD 06/01/2007  . BARRETTS ESOPHAGUS 03/20/2009  . PROSTATE CANCER, HX OF 01/14/2008  . CATARACTS, BILATERAL, HX OF 01/14/2008  . CEREBROVASCULAR ACCIDENT, HX OF 08/18/2008  . PERSONAL HX COLONIC POLYPS 01/01/2010  . BASAL CELL CARCINOMA OF SKIN SITE UNSPECIFIED 09/28/2010  . HYPERTHYROIDISM 01/06/2011  . Hyperglycemia   . Elevated PSA   . Restless leg syndrome   . Mitral regurgitation 06/13/2008    mitral valve replacement #20 St. Jude Biocor; Maze procedure by Dr. Amador Cunas  at [facility]  . CORONARY ARTERY DISEASE 06/01/2007    Cath 04/03/2008; Cath 11/01/1990  . Tachy-brady syndrome 07/13/2008    medtronic Adapta gen. model ADDR01 ,serial # [id number] H by Dr  [name] at [facility]  . Peripheral artery disease 06/16/2010    LEA doppler-left ABI nml 0.61 calcified vessels;right ABI  0.68  . Edema, peripheral 07/30/2009    LEV doppler- no thrombus or thrombophlebitis  . Aortic valvular disorder 08/16/2012    echo EF 45-50% LV mildly reduce,Biocor mitral valve,mild to mod. tricuspid regrug.,mild to mod. aortic regrug.  . Mitral valve disorder 09/08/2011    echo EF 50-55% LV normal    Past Surgical History  Procedure Laterality Date  . Prostatectomy    . Tonsillectomy    . Cystectomy      Left leg  . Pacemaker placement  07/13/2008    [place] dual chamber-Medtronic at [facility]   . Coronary artery bypass graft  06/13/2008    graft x1 w/vein graft LAD artery by Dr. Amador Cunas at [facility]  . Mitral valve replacement  06/13/2008    #20 St. Jude Bicor mitral valve ;Maze procedure at [facility] by Dr. Amador Cunas  . Cardiac catheterization  11/01/1990  . Skin cancer excision      removed from forehead and right leg, nose/face  . Upper gastrointestinal endoscopy  07/14/2009    erosive reflux esopagitis, Barrett's esophagus  . Colonoscopy  07/14/2009     diverticulosis, internal hemorrhoids  . Cardiovascular stress test  06/03/2010    Persantine perfusion EF 45% mild to moderate ischemia basal and mid inferolateral region  . Cardiovascular stress test  01/01/2008    left ventricle normal,no significant ischemia  . Cardiovascular stress test  09/09/2005     possibility of ischemia inferior wall    Family History  Problem Relation Age of Onset  . Cancer Mother     Breast Cancer    History   Social History  . Marital Status: Married    Spouse Name: N/A    Number of Children: 4  . Years of Education: N/A   Occupational History  . Retired    Social History Main Topics  . Smoking status: Former Smoker -- 2 years    Types: Pipe    Quit date: 11/07/1965  . Smokeless tobacco: Never Used  Comment: quit over 40 years ago, never smoked cigarettes  . Alcohol Use: No  . Drug Use: No  . Sexual Activity: Not on file   Other Topics Concern  . Not on file   Social History Narrative  . No narrative on file    Review of systems: The patient specifically denies any chest pain at rest or with exertion, dyspnea at rest or with exertion, orthopnea, paroxysmal nocturnal dyspnea, syncope, palpitations, focal neurological deficits, intermittent claudication, lower extremity edema, unexplained weight gain, cough, hemoptysis or wheezing.  The patient also denies abdominal pain, nausea, vomiting, dysphagia, diarrhea, constipation, polyuria, polydipsia, dysuria, hematuria, frequency, urgency, abnormal bleeding or bruising, fever, chills, unexpected weight changes, mood swings, change in skin or hair texture, change in voice quality, auditory or visual problems, allergic reactions or rashes, new musculoskeletal complaints other than usual "aches and pains".   PHYSICAL EXAM BP 104/68  Pulse 73  Ht 5' 10"  (1.778 m)  Wt 171 lb 9.6 oz (77.837 kg)  BMI 24.62 kg/m2  General: Alert, oriented x3, no distress; he has prominent thoracic  kyphosis Head: no evidence of trauma, PERRL, EOMI, no exophtalmos or lid lag, no myxedema, no xanthelasma; normal ears, nose and oropharynx Neck: normal jugular venous pulsations and no hepatojugular reflux; brisk carotid pulses without delay and no carotid bruits Chest: clear to auscultation, no signs of consolidation by percussion or palpation, normal fremitus, symmetrical and full respiratory excursions. Mild sternal deformity Cardiovascular: normal position and quality of the apical impulse, regular rhythm, normal first and second heart sounds, no diastolic murmurs, rubs or gallops.  Mid-peaking grade 3/6 aortic ejection murmur heard throughout the entire precordium loudest at the right upper sternal border Abdomen: no tenderness or distention, no masses by palpation, no abnormal pulsatility or arterial bruits, normal bowel sounds, no hepatosplenomegaly Extremities: no clubbing, cyanosis or edema; 2+ radial, ulnar and brachial pulses bilaterally; 2+ right femoral, posterior tibial and dorsalis pedis pulses; 2+ left femoral, posterior tibial and dorsalis pedis pulses; no subclavian or femoral bruits Neurological: grossly nonfocal   EKG: Atrial ventricular sequential pacing   Pacemaker check in the office today shows that he does not have any underlying atrial activity or ventricular activity and is pacemaker dependent. Occasional brief paroxysmal atrial tachycardia recorded. No atrophic fibrillation has been noted since February of 2012  Lipid Panel     Component Value Date/Time   CHOL 122 07/26/2013 1005   TRIG 62 07/26/2013 1005   HDL 39* 07/26/2013 1005   CHOLHDL 3.1 07/26/2013 1005   VLDL 12 07/26/2013 1005   LDLCALC 71 07/26/2013 1005    BMET    Component Value Date/Time   NA 143 07/26/2013 1005   K 4.2 07/26/2013 1005   CL 107 07/26/2013 1005   CO2 30 07/26/2013 1005   GLUCOSE 90 07/26/2013 1005   BUN 22 07/26/2013 1005   CREATININE 1.02 07/26/2013 1005   CREATININE 0.9 08/18/2011 1001    CALCIUM 8.7 07/26/2013 1005   GFRNONAA 68.66 01/01/2010 0811   GFRAA 121 10/21/2008 1006     ASSESSMENT AND PLAN Aortic valvular stenosis He seems to have at least moderate if not severe aortic valve stenosis. However at this point in time it remains asymptomatic despite the fact that he has a very active lifestyle. Warned him to pay attention to signs of exertional angina, exertional dyspnea or exertional syncope/presyncope, which would prompt referral for therapy. Since he has had previous thoracotomy and bypass surgery as well as the presence of some  chest deformity with prominent kyphosis, he's probably not a good candidate for conventional aortic valve replacement. However I think he would be an excellent candidate for TAVR. I described this technology to him in some detail. We reviewed the fact that he would have to have additional workup including CT angiography evaluation of the aortic root and the pelvic arterial system as well as repeat coronary angiography. This additional workup can be delayed until he develops symptomatic aortic stenosis.   CORONARY ARTERY DISEASE He received a saphenous vein graft bypass to the LAD artery since his LIMA conduit was deemed to be too small at the time of his original surgery. He should undergo repeat coronary angiography if he goes for aortic valve replacement. Notes that he had moderate stenoses in the mid right coronary artery and left circumflex/oblique marginal system. These appear relatively discrete and might be amenable to percutaneous revascularization if they have progressed. 3 risk factor modification and physical activity.  Paroxysmal atrial fibrillation status post cryoablation While on the amiodarone therapy and following his cryoablation he has had a very low prevalence of atrial fibrillation. Nevertheless an hour-long episode of atrial fibrillation has been recorded long after his ablation procedure. Having said that it has been almost 3  years since that event occurred and his pacemaker shows only extremely brief episodes of atrial tachycardia. Specifically inquired about discontinuation of anticoagulants. He has had a previous stroke and has valvular heart disease, therefore is in the highest risk category for future embolic events. I believe barring any serious bleeding complications he should continue warfarin. It is impossible to date when he will have atrial fibrillation again. On the other hand I see no purpose for digoxin therapy. He never has high ventricular rates and paces the ventricle almost 100% of the time. This medication with its low therapeutic index should be discontinued. Similarly, if he does not have atrial fibrillation next few months I would encourage him to try to wean off the amiodarone. The exception to this would be if we see progression of aortic stenosis symptoms to the point that he will require intervention. There is a high likelihood he will have recurrent arrhythmia around the time of any aortic valve procedure. Meanwhile continue biannual assessment of liver function tests and frequent assessment of thyroid function studies. Warned about the risks of photosensitivity/hyperpigmentation and the need for yearly ophthalmological exam. I don't think he's had a baseline pulmonary function test although it is reasonable to believe one was performed before his initial of replacement surgery. If one can be located it is not unreasonable to also perform yearly pulmonary function tests to monitor for amiodarone lung toxicity to  CEREBROVASCULAR ACCIDENT, HX OF This is a perioperative complication and has not left residual abnormalities. To my knowledge significant carotid disease has not been documented. He has minimal evidence of peripheral arterial disease of the lower extremities with occlusion of the right posterior tibial artery.  HYPERTENSION His blood pressure is quite low today but he is asymptomatic. No  changes were made to his medications. I need to reduce the doses of antihypertensives may suggest progression of aortic stenosis and need for further evaluation.  HYPERLIPIDEMIA The parameters are within the desirable range. The potential interaction between amiodarone and simvastatin is duly noted. It is also noted that he has been on this combination of medications for over 5 years without any adverse side effects. No changes are made.  Pacemaker - Medtronic Adapta, dual chamber Pacemaker dependent. Please refer to pacemaker note  from today. Normal device function. No atrophic relation recorded. 100% AV sequential pacing.  Cardiomyopathy, ischemic Despite mildly depressed LVEF of 45-50% he has never had manifestations of clinical heart failure. The presumption was that his mother Dalbert Garnet for secondary to pacing induced asynchrony but his most recent echo showed inferolateral hypokinesis suggestive of ischemic heart disease. He does not have accompanying angina. He is on appropriate treatment with a beta blocker and his blood pressure and aortic stenosis will not permit ACE inhibitor/angiotensin receptor blocker therapy. NYHA functional class I. Clinically euvolemic.  HYPERTHYROIDISM Likely amiodarone related. Well compensated on a small dose of methimazole   Orders Placed This Encounter  Procedures  . Comp Met (CMET)  . Lipid Profile  . EKG 12-Lead   No orders of the defined types were placed in this encounter.    Holli Humbles, MD, [place] [phone] office [phone] pager

## 2013-11-19 NOTE — Assessment & Plan Note (Signed)
This is a perioperative complication and has not left residual abnormalities. To my knowledge significant carotid disease has not been documented. He has minimal evidence of peripheral arterial disease of the lower extremities with occlusion of the right posterior tibial artery.

## 2013-11-19 NOTE — Assessment & Plan Note (Signed)
He received a saphenous vein graft bypass to the LAD artery since his LIMA conduit was deemed to be too small at the time of his original surgery. He should undergo repeat coronary angiography if he goes for aortic valve replacement. Notes that he had moderate stenoses in the mid right coronary artery and left circumflex/oblique marginal system. These appear relatively discrete and might be amenable to percutaneous revascularization if they have progressed. 3 risk factor modification and physical activity.

## 2013-11-19 NOTE — Assessment & Plan Note (Signed)
Likely amiodarone related. Well compensated on a small dose of methimazole

## 2013-11-19 NOTE — Assessment & Plan Note (Signed)
The parameters are within the desirable range. The potential interaction between amiodarone and simvastatin is duly noted. It is also noted that he has been on this combination of medications for over 5 years without any adverse side effects. No changes are made.

## 2013-11-19 NOTE — Assessment & Plan Note (Signed)
Pacemaker dependent. Please refer to pacemaker note from today. Normal device function. No atrophic relation recorded. 100% AV sequential pacing.

## 2013-11-19 NOTE — Assessment & Plan Note (Signed)
His blood pressure is quite low today but he is asymptomatic. No changes were made to his medications. I need to reduce the doses of antihypertensives may suggest progression of aortic stenosis and need for further evaluation.

## 2013-11-19 NOTE — Assessment & Plan Note (Signed)
While on the amiodarone therapy and following his cryoablation he has had a very low prevalence of atrial fibrillation. Nevertheless an hour-long episode of atrial fibrillation has been recorded long after his ablation procedure. Having said that it has been almost 3 years since that event occurred and his pacemaker shows only extremely brief episodes of atrial tachycardia. Specifically inquired about discontinuation of anticoagulants. He has had a previous stroke and has valvular heart disease, therefore is in the highest risk category for future embolic events. I believe barring any serious bleeding complications he should continue warfarin. It is impossible to date when he will have atrial fibrillation again. On the other hand I see no purpose for digoxin therapy. He never has high ventricular rates and paces the ventricle almost 100% of the time. This medication with its low therapeutic index should be discontinued. Similarly, if he does not have atrial fibrillation next few months I would encourage him to try to wean off the amiodarone. The exception to this would be if we see progression of aortic stenosis symptoms to the point that he will require intervention. There is a high likelihood he will have recurrent arrhythmia around the time of any aortic valve procedure. Meanwhile continue biannual assessment of liver function tests and frequent assessment of thyroid function studies. Warned about the risks of photosensitivity/hyperpigmentation and the need for yearly ophthalmological exam. I don't think he's had a baseline pulmonary function test although it is reasonable to believe one was performed before his initial of replacement surgery. If one can be located it is not unreasonable to also perform yearly pulmonary function tests to monitor for amiodarone lung toxicity to

## 2013-11-19 NOTE — Patient Instructions (Addendum)
Remote monitoring is used to monitor your pacemaker from home. This monitoring reduces the number of office visits required to check your device to one time per year. It allows [place] to keep an eye on the functioning of your device to ensure it is working properly. You are scheduled for a device check from home on 02-20-2014. You may send your transmission at any time that day. If you have a wireless device, the transmission will be sent automatically. After your physician reviews your transmission, you will receive a postcard with your next transmission date.  Your physician recommends that you return for lab work in: March 2015. Do not eat or drink prior to having labs drawn.  Your physician recommends that you schedule a follow-up appointment in: 6 months with Dr.[name]

## 2013-11-19 NOTE — Assessment & Plan Note (Signed)
He seems to have at least moderate if not severe aortic valve stenosis. However at this point in time it remains asymptomatic despite the fact that he has a very active lifestyle. Warned him to pay attention to signs of exertional angina, exertional dyspnea or exertional syncope/presyncope, which would prompt referral for therapy. Since he has had previous thoracotomy and bypass surgery as well as the presence of some chest deformity with prominent kyphosis, he's probably not a good candidate for conventional aortic valve replacement. However I think he would be an excellent candidate for TAVR. I described this technology to him in some detail. We reviewed the fact that he would have to have additional workup including CT angiography evaluation of the aortic root and the pelvic arterial system as well as repeat coronary angiography. This additional workup can be delayed until he develops symptomatic aortic stenosis.

## 2013-11-20 ENCOUNTER — Ambulatory Visit: Payer: Medicare Other | Admitting: Pharmacist Clinician (PhC)/ Clinical Pharmacy Specialist

## 2013-11-20 ENCOUNTER — Encounter: Payer: Self-pay | Admitting: Cardiovascular Disease

## 2013-12-05 ENCOUNTER — Telehealth: Payer: Self-pay | Admitting: Critical Care Medicine

## 2013-12-05 ENCOUNTER — Ambulatory Visit (INDEPENDENT_AMBULATORY_CARE_PROVIDER_SITE_OTHER): Payer: Medicare Other | Admitting: Internal Medicine

## 2013-12-05 ENCOUNTER — Other Ambulatory Visit (INDEPENDENT_AMBULATORY_CARE_PROVIDER_SITE_OTHER): Payer: Medicare Other

## 2013-12-05 ENCOUNTER — Ambulatory Visit (INDEPENDENT_AMBULATORY_CARE_PROVIDER_SITE_OTHER)
Admission: RE | Admit: 2013-12-05 | Discharge: 2013-12-05 | Disposition: A | Discharge status: Home / Self Care | Payer: Medicare Other | Source: Ambulatory Visit | Attending: Internal Medicine | Admitting: Internal Medicine

## 2013-12-05 ENCOUNTER — Encounter: Payer: Self-pay | Admitting: Internal Medicine

## 2013-12-05 VITALS — BP 116/70 | HR 85 | Temp 97.5°F | Ht 70.0 in | Wt 180.0 lb

## 2013-12-05 DIAGNOSIS — R0600 Dyspnea, unspecified: Secondary | ICD-10-CM

## 2013-12-05 DIAGNOSIS — R0609 Other forms of dyspnea: Secondary | ICD-10-CM

## 2013-12-05 DIAGNOSIS — J90 Pleural effusion, not elsewhere classified: Secondary | ICD-10-CM | POA: Insufficient documentation

## 2013-12-05 DIAGNOSIS — R0989 Other specified symptoms and signs involving the circulatory and respiratory systems: Secondary | ICD-10-CM

## 2013-12-05 DIAGNOSIS — R911 Solitary pulmonary nodule: Secondary | ICD-10-CM

## 2013-12-05 LAB — BASIC METABOLIC PANEL
BUN: 19 mg/dL (ref 6–23)
CO2: 31 mEq/L (ref 19–32)
Calcium: 8.8 mg/dL (ref 8.4–10.5)
Chloride: 106 mEq/L (ref 96–112)
Creatinine, Ser: 0.8 mg/dL (ref 0.4–1.5)
GFR: 92.81 mL/min (ref >60.00)
Glucose, Bld: 87 mg/dL (ref 70–99)
POTASSIUM: 4 meq/L (ref 3.5–5.1)
SODIUM: 143 meq/L (ref 135–145)

## 2013-12-05 LAB — CBC WITH DIFFERENTIAL/PLATELET
BASOS ABS: 0 10*3/uL (ref 0.0–0.1)
BASOS PCT: 0.3 % (ref 0.0–3.0)
Eosinophils Absolute: 0.2 10*3/uL (ref 0.0–0.7)
Eosinophils Relative: 3.6 % (ref 0.0–5.0)
HCT: 37.7 % — ABNORMAL LOW (ref 39.0–52.0)
HEMOGLOBIN: 12.4 g/dL — AB (ref 13.0–17.0)
LYMPHS PCT: 19.9 % (ref 12.0–46.0)
Lymphs Abs: 1.2 10*3/uL (ref 0.7–4.0)
MCHC: 32.9 g/dL (ref 30.0–36.0)
MCV: 93.5 fl (ref 78.0–100.0)
Monocytes Absolute: 0.5 10*3/uL (ref 0.1–1.0)
Monocytes Relative: 9.5 % (ref 3.0–12.0)
NEUTROS ABS: 3.9 10*3/uL (ref 1.4–7.7)
Neutrophils Relative %: 66.7 % (ref 43.0–77.0)
Platelets: 164 10*3/uL (ref 150.0–400.0)
RBC: 4.04 Mil/uL — AB (ref 4.22–5.81)
RDW: 14.9 % — ABNORMAL HIGH (ref 11.5–14.6)
WBC: 5.8 10*3/uL (ref 4.5–10.5)

## 2013-12-05 LAB — SEDIMENTATION RATE: Sed Rate: 16 mm/hr (ref 0–22)

## 2013-12-05 LAB — TSH: TSH: 4.77 u[IU]/mL (ref 0.35–5.50)

## 2013-12-05 LAB — BRAIN NATRIURETIC PEPTIDE: PRO B NATRI PEPTIDE: 382 pg/mL — AB (ref 0.0–100.0)

## 2013-12-05 NOTE — Telephone Encounter (Signed)
Called and spoke with pt. He is scheduled to come in for OV with MW this afternoon in [place] office. Nothing further needed

## 2013-12-05 NOTE — Progress Notes (Addendum)
Subjective:     Patient ID: [name] [name], male   DOB: 12/15/1928    MRN: [id number]  HPI  48 yowm never smoker with h/o R pleural effusion self referred to Pulmonary clinic  12/05/2013 for recurrent sob  Last eval by Dr Joya Gaskins 08/30/13 Imp Restrictive lung disease on the basis of significant kyphotic changes and chronic scar and pleural space from prior pneumonia but no evidence of active pneumonitis or pleural effusions  Plan  Expectant observation for now  Return to pulmonary as needed    12/05/2013 1st [place] [facility] office visit/ [name] cc acute onset sob x 3 weeks progressively worse to point of difficulty going up full flight of steps s stopping half way up.  Also when lie down a bit uncomfortable  No obvious day to day or daytime variabilty or assoc chronic cough or cp or chest tightness, subjective wheeze overt sinus or hb symptoms. No unusual exp hx or h/o childhood pna/ asthma or knowledge of premature birth.  Sleeping ok without nocturnal  or early am exacerbation  of respiratory  c/o's or need for noct saba. Also denies any obvious fluctuation of symptoms with weather or environmental changes or other aggravating or alleviating factors except as outlined above   Current Medications, Allergies, Complete Past Medical History, Past Surgical History, Family History, and Social History were reviewed in [employer] record.  ROS  The following are not active complaints unless bolded sore throat, dysphagia, dental problems, itching, sneezing,  nasal congestion or excess/ purulent secretions, ear ache,   fever, chills, sweats, unintended wt loss, pleuritic or exertional cp, hemoptysis,  orthopnea pnd or leg swelling, presyncope, palpitations, heartburn, abdominal pain, anorexia, nausea, vomiting, diarrhea  or change in bowel or urinary habits, change in stools or urine, dysuria,hematuria,  rash, arthralgias, visual complaints, headache, numbness weakness or ataxia or  problems with walking or coordination,  change in mood/affect or memory.       Review of Systems     Objective:   Physical Exam Wt Readings from Last 3 Encounters:  12/05/13 180 lb (81.647 kg)  11/19/13 171 lb 9.6 oz (77.837 kg)  08/30/13 181 lb 3.2 oz (82.192 kg)     Hoarse amb wm nad mod severe kyphosis  HEENT: nl dentition, turbinates, and orophanx. Nl external ear canals without cough reflex   NECK :  without JVD/Nodes/TM/ nl carotid upstrokes bilaterally   LUNGS: no acc muscle use, clear to A and P bilaterally without cough on insp or exp maneuvers   CV:  RRR  no s3   III/VI SEM  - no  increase in P2, no edema   ABD:  soft and nontender with nl excursion in the supine position. No bruits or organomegaly, bowel sounds nl  MS:  warm without deformities, calf tenderness, cyanosis or clubbing - both legs with severe chronic  venous stasis/ stasis dermatitis/ trace edema  SKIN: warm and dry without lesions    NEURO:  alert, approp, no deficits    CXR  12/05/2013 :   Stable chronic bibasilar changes with suggestion of pleural parenchymal scarring versus small amount of pleural fluid.  9 mm nodule opacity over the left midlung. Recommend followup chest radiograph in 3 months. If this persists, recommend noncontrast chest CT.  Large hiatal hernia unchanged.    Labs 12/05/2013  Ok,  bnp 382      Assessment:

## 2013-12-05 NOTE — Patient Instructions (Signed)
Be sure to take your protonix (pantoprazole) Take 30- 60 min before your first and last meals of the day   GERD (REFLUX)  is an extremely common cause of respiratory symptoms, many times with no significant heartburn at all.    It can be treated with medication, but also with lifestyle changes including avoidance of late meals, excessive alcohol, smoking cessation, and avoid fatty foods, chocolate, peppermint, colas, red wine, and acidic juices such as orange juice.  NO MINT OR MENTHOL PRODUCTS SO NO COUGH DROPS  USE SUGARLESS CANDY INSTEAD (jolley ranchers or Stover's)  NO OIL BASED VITAMINS - use powdered substitutes.    Please remember to go to the lab and x-ray department downstairs for your tests - we will call you with the results when they are available.

## 2013-12-06 ENCOUNTER — Ambulatory Visit (INDEPENDENT_AMBULATORY_CARE_PROVIDER_SITE_OTHER): Payer: Medicare Other | Admitting: Pharmacist Clinician (PhC)/ Clinical Pharmacy Specialist

## 2013-12-06 VITALS — BP 112/72 | HR 88

## 2013-12-06 DIAGNOSIS — Z7901 Long term (current) use of anticoagulants: Secondary | ICD-10-CM

## 2013-12-06 DIAGNOSIS — I4891 Unspecified atrial fibrillation: Secondary | ICD-10-CM

## 2013-12-06 DIAGNOSIS — Z8679 Personal history of other diseases of the circulatory system: Secondary | ICD-10-CM

## 2013-12-06 LAB — POCT INR: INR: 1.6

## 2013-12-08 DIAGNOSIS — R911 Solitary pulmonary nodule: Secondary | ICD-10-CM | POA: Insufficient documentation

## 2013-12-08 NOTE — Assessment & Plan Note (Addendum)
Followed in Pulmonary clinic/ [facility]/ Avenell Sellers  - onset around 11/07/2013  - 12/05/2013  Walked RA x 3 laps @ 185 ft each stopped due to mild sob, no desat  Symptoms are markedly disproportionate to objective findings and not clear this is a lung problem but pt does appear to have difficult airway management issues. DDX of  difficult airways managment all start with A and  include Adherence, Ace Inhibitors, Acid Reflux, Active Sinus Disease, Alpha 1 Antitripsin deficiency, Anxiety masquerading as Airways dz,  ABPA,  allergy(esp in young), Aspiration (esp in elderly), Adverse effects of DPI,  Active smokers, plus two Bs  = Bronchiectasis and Beta blocker use..and one C= CHF   Adherence is always the initial "prime suspect" and is a multilayered concern that requires a "trust but verify" approach in every patient - starting with knowing how to use medications, especially inhalers, correctly, keeping up with refills and understanding the fundamental difference between maintenance and prns vs those medications only taken for a very short course and then stopped and not refilled.   ? Acid (or non-acid) GERD assoc with very large HH > always difficult to exclude as up to 75% of pts in some series report no assoc GI/ Heartburn symptoms> rec max (24h)  acid suppression and diet restrictions/ reviewed and instructions given in writing.   ? chf > bnp 382 which is intermediate and does not exclude diastolic chf > continue rx   ? Beta blocker effect > no evidence of wheezing so probably ok  ? Adverse drug effect esp amiodarone > esr only 16, no desat with walking so ok to continue

## 2013-12-08 NOTE — Assessment & Plan Note (Addendum)
See cxr 12/05/13 > placed in tickle file for recall 03/05/14 as this is moot issue for now

## 2013-12-08 NOTE — Assessment & Plan Note (Signed)
S/p L thoracentesis 07/29/11 x 1.5 liters blood tinged fluid>> borderline exudate/ neg cyt ? parapneumonic  No evidence of sign recurrence

## 2013-12-10 ENCOUNTER — Ambulatory Visit: Payer: Medicare Other | Admitting: Internal Medicine

## 2013-12-19 ENCOUNTER — Telehealth: Payer: Self-pay | Admitting: Cardiovascular Disease

## 2013-12-20 ENCOUNTER — Ambulatory Visit (INDEPENDENT_AMBULATORY_CARE_PROVIDER_SITE_OTHER): Payer: Medicare Other | Admitting: Pharmacist Clinician (PhC)/ Clinical Pharmacy Specialist

## 2013-12-20 VITALS — BP 130/78 | HR 68

## 2013-12-20 DIAGNOSIS — I4891 Unspecified atrial fibrillation: Secondary | ICD-10-CM

## 2013-12-20 DIAGNOSIS — Z7901 Long term (current) use of anticoagulants: Secondary | ICD-10-CM

## 2013-12-20 DIAGNOSIS — Z8679 Personal history of other diseases of the circulatory system: Secondary | ICD-10-CM

## 2013-12-20 LAB — POCT INR: INR: 2.4

## 2013-12-23 ENCOUNTER — Encounter: Payer: Self-pay | Admitting: Endocrinology

## 2013-12-23 ENCOUNTER — Ambulatory Visit (INDEPENDENT_AMBULATORY_CARE_PROVIDER_SITE_OTHER): Payer: Medicare Other | Admitting: Endocrinology

## 2013-12-23 VITALS — BP 122/70 | HR 79 | Temp 97.9°F | Ht 70.0 in | Wt 178.0 lb

## 2013-12-23 DIAGNOSIS — D509 Iron deficiency anemia, unspecified: Secondary | ICD-10-CM

## 2013-12-23 DIAGNOSIS — E785 Hyperlipidemia, unspecified: Secondary | ICD-10-CM

## 2013-12-23 LAB — LIPID PANEL
Cholesterol: 112 mg/dL (ref 0–200)
HDL: 41.5 mg/dL (ref >39.00)
LDL Cholesterol: 58 mg/dL (ref 0–99)
Total CHOL/HDL Ratio: 3
Triglycerides: 62 mg/dL (ref 0.0–149.0)
VLDL: 12.4 mg/dL (ref 0.0–40.0)

## 2013-12-23 LAB — IBC PANEL
IRON: 62 ug/dL (ref 42–165)
SATURATION RATIOS: 22.6 % (ref 20.0–50.0)
Transferrin: 196.3 mg/dL — ABNORMAL LOW (ref 212.0–360.0)

## 2013-12-23 MED ORDER — CEPHALEXIN 500 MG PO CAPS: 500.0000 mg | ORAL_CAPSULE | Freq: Three times a day (TID) | ORAL | Status: DC

## 2013-12-23 NOTE — Progress Notes (Signed)
Subjective:    Patient ID: [name] [name], male    DOB: 21-Feb-1929, 80 y.o.   MRN: [id number]  HPI Pt states 2 weeks of slight pain at the left foot, and assoc swelling.  He thinks this may be related to recent skin bx there.  No other injury.   He takes fe 1 tab/day. Past Medical History  Diagnosis Date  . GASTRIC POLYP 05/13/2009  . COLONIC POLYPS, ADENOMATOUS 01/14/2008  . HYPERLIPIDEMIA 06/01/2007  . ANEMIA, IRON DEFICIENCY 10/13/2008  . COAGULOPATHY, COUMADIN-INDUCED 01/14/2008  . ANXIETY 09/04/2008  . HYPERTENSION 06/01/2007  . ATRIAL FIBRILLATION, CHRONIC 01/14/2008    on warfarin since 2007  . UNSPECIFIED VENOUS INSUFFICIENCY 09/18/2008  . PLEURAL EFFUSION, LEFT 10/13/2008  . GERD 06/01/2007  . BARRETTS ESOPHAGUS 03/20/2009  . PROSTATE CANCER, HX OF 01/14/2008  . CATARACTS, BILATERAL, HX OF 01/14/2008  . CEREBROVASCULAR ACCIDENT, HX OF 08/18/2008  . PERSONAL HX COLONIC POLYPS 01/01/2010  . BASAL CELL CARCINOMA OF SKIN SITE UNSPECIFIED 09/28/2010  . HYPERTHYROIDISM 01/06/2011  . Hyperglycemia   . Elevated PSA   . Restless leg syndrome   . Mitral regurgitation 06/13/2008    mitral valve replacement #20 St. Jude Biocor; Maze procedure by Dr. Amador Cunas  at [facility]  . CORONARY ARTERY DISEASE 06/01/2007    Cath 04/03/2008; Cath 11/01/1990  . Tachy-brady syndrome 07/13/2008    medtronic Adapta gen. model ADDR01 ,serial # [id number] H by Dr  [name] at [facility]  . Peripheral artery disease 06/16/2010    LEA doppler-left ABI nml 0.61 calcified vessels;right ABI  0.68  . Edema, peripheral 07/30/2009    LEV doppler- no thrombus or thrombophlebitis  . Aortic valvular disorder 08/16/2012    echo EF 45-50% LV mildly reduce,Biocor mitral valve,mild to mod. tricuspid regrug.,mild to mod. aortic regrug.  . Mitral valve disorder 09/08/2011    echo EF 50-55% LV normal  . Aortic valvular stenosis 11/19/2013  . Cardiomyopathy, ischemic 11/19/2013    EF 45-50% with inferior wall hypokinesis by echo 2014     Past Surgical History  Procedure Laterality Date  . Prostatectomy    . Tonsillectomy    . Cystectomy      Left leg  . Pacemaker placement  07/13/2008    Adapata dual chamber-Medtronic at [facility]   . Coronary artery bypass graft  06/13/2008    graft x1 w/vein graft LAD artery by Dr. Amador Cun[facility] at [facility]  . Mitral valve replacement  06/13/2008    #20 St. Jude Bicor mitral valve ;Maze procedure at [facility] by Dr. Amador Cunas  . Cardiac catheterization  11/01/1990  . Skin cancer excision      removed from forehead and right leg, nose/face  . Upper gastrointestinal endoscopy  07/14/2009    erosive reflux esopagitis, Barrett's esophagus  . Colonoscopy  07/14/2009    diverticulosis, internal hemorrhoids  . Cardiovascular stress test  06/03/2010    Persantine perfusion EF 45% mild to moderate ischemia basal and mid inferolateral region  . Cardiovascular stress test  01/01/2008    left ventricle normal,no significant ischemia  . Cardiovascular stress test  09/09/2005     possibility of ischemia inferior wall    History   Social History  . Marital Status: Married    Spouse Name: N/A    Number of Children: 4  . Years of Education: N/A   Occupational History  . Retired    Social History Main Topics  . Smoking status: Former Smoker -- 2 years  Types: Pipe    Quit date: 11/07/1965  . Smokeless tobacco: Never Used     Comment: quit over 40 years ago, never smoked cigarettes  . Alcohol Use: No  . Drug Use: No  . Sexual Activity: Not on file   Other Topics Concern  . Not on file   Social History Narrative  . No narrative on file    Current Outpatient Prescriptions on File Prior to Visit  Medication Sig Dispense Refill  . amiodarone (PACERONE) 200 MG tablet Take 1 tablet (200 mg total) by mouth daily.  90 tablet  1  . aspirin EC 81 MG tablet Take 81 mg by mouth daily.      . ferrous sulfate 325 (65 FE) MG tablet Take 325 mg by mouth daily with breakfast.      . methimazole (TAPAZOLE) 5  MG tablet Take 1 tablet (5 mg total) by mouth 3 (three) times a week. On Monday, Wednesday and Friday  90 tablet  0  . metoprolol succinate (TOPROL-XL) 100 MG 24 hr tablet Take 1 tablet (100 mg total) by mouth daily. Take with or immediately following a meal.  90 tablet  3  . Multiple Vitamin (MULTIVITAMIN WITH MINERALS) TABS Take 1 tablet by mouth daily. Centrum Silver      . pantoprazole (PROTONIX) 40 MG tablet Take 1 tablet (40 mg total) by mouth 2 (two) times daily.  180 tablet  0  . simvastatin (ZOCOR) 40 MG tablet Take 1 tablet (40 mg total) by mouth at bedtime.  90 tablet  2  . Tamsulosin HCl (FLOMAX) 0.4 MG CAPS Take by mouth every evening.       . warfarin (COUMADIN) 3 MG tablet Take 3-4.5 mg by mouth every evening. 1 1/2 tabs (4.5 mg) on Monday, Wednesday and Friday, 1 tab (3 mg) on Tuesday, Thursday, Saturday and Sunday       No current facility-administered medications on file prior to visit.   No Known Allergies  Family History  Problem Relation Age of Onset  . Cancer Mother     Breast Cancer   BP 122/70  Pulse 79  Temp(Src) 97.9 F (36.6 C)  Ht 5\' 10"  (1.778 m)  Wt 178 lb (80.74 kg)  BMI 25.54 kg/m2  SpO2 %  Review of Systems Denies fever and sob.      Objective:   Physical Exam VITAL SIGNS:  See vs page. GENERAL: no distress.  Left foot: 1 cm healing skin bx site, just distal the the lateral malleolus.  There is slight surrounding erythema, swelling, and tenderness.      Assessment & Plan:  Cellulitis, ? Related to recent skin bx. Anemia, with well-controlled fe-deficiency.

## 2013-12-23 NOTE — Patient Instructions (Addendum)
i have sent a prescription to your pharmacy, for an antibiotic pill.   You will get better much faster if you elevate your foot above the rest of your body.    I hope you feel better soon.  If you don't get better by next week, please call back.  Please call sooner if your foot gets worse. Please come back soon for a regular physical.   blood tests are being requested for you today.  We'll contact you with results.

## 2013-12-26 ENCOUNTER — Ambulatory Visit: Payer: Medicare Other | Admitting: Internal Medicine

## 2013-12-26 ENCOUNTER — Ambulatory Visit (INDEPENDENT_AMBULATORY_CARE_PROVIDER_SITE_OTHER): Payer: Medicare Other | Admitting: Internal Medicine

## 2013-12-26 ENCOUNTER — Encounter: Payer: Self-pay | Admitting: Internal Medicine

## 2013-12-26 VITALS — BP 120/68 | HR 64 | Ht 70.0 in | Wt 179.2 lb

## 2013-12-26 DIAGNOSIS — K2270 Barrett's esophagus without dysplasia: Secondary | ICD-10-CM

## 2013-12-26 MED ORDER — PANTOPRAZOLE SODIUM 40 MG PO TBEC: 40.0000 mg | DELAYED_RELEASE_TABLET | Freq: Two times a day (BID) | ORAL | Status: DC

## 2013-12-26 NOTE — Progress Notes (Signed)
    Subjective:    Patient ID: [name] [name], male    DOB: 08/19/1929, 80 y.o.   MRN: [id number]  HPI The patient is here for f/u GERD and Barrett's esophagus. He has no c/o heartburn or dysphagia. Needs PA for pantoprazole 40 mg bid Medications, allergies, past medical history, past surgical history, family history and social history are reviewed and updated in the EMR.   Review of Systems As above    Objective:   Physical Exam Elderly NAD    Assessment & Plan:  Barrett's esophagus

## 2013-12-26 NOTE — Assessment & Plan Note (Signed)
No c/o heartburn or dysphagia on bid PPI. Needs PA to refill bid pantoprazole and will do that. RTC 1 year - he is still vigorous and can reconsider repeat surveillance in 2016 - perhaps - reviewed today

## 2013-12-26 NOTE — Patient Instructions (Signed)
We have sent the following medications to your pharmacy for you to pick up at your convenience: pantoprazole   Follow up in a year.   I appreciate the opportunity to care for you.

## 2013-12-27 ENCOUNTER — Other Ambulatory Visit: Payer: Self-pay | Admitting: Pharmacist Clinician (PhC)/ Clinical Pharmacy Specialist

## 2013-12-27 MED ORDER — WARFARIN SODIUM 3 MG PO TABS: ORAL_TABLET | ORAL | Status: DC

## 2013-12-30 ENCOUNTER — Encounter: Payer: Self-pay | Admitting: Endocrinology

## 2013-12-30 ENCOUNTER — Ambulatory Visit (INDEPENDENT_AMBULATORY_CARE_PROVIDER_SITE_OTHER): Payer: Medicare Other | Admitting: Endocrinology

## 2013-12-30 ENCOUNTER — Telehealth: Payer: Self-pay | Admitting: *Deleted

## 2013-12-30 ENCOUNTER — Telehealth: Payer: Self-pay

## 2013-12-30 VITALS — BP 118/82 | HR 94 | Temp 97.0°F | Ht 70.0 in | Wt 176.0 lb

## 2013-12-30 DIAGNOSIS — Z0000 Encounter for general adult medical examination without abnormal findings: Secondary | ICD-10-CM

## 2013-12-30 NOTE — Patient Instructions (Signed)
please consider these measures for your health:  minimize alcohol.  do not use tobacco products.  have a colonoscopy at least every 10 years from age 80.  keep firearms safely stored.  always use seat belts.  have working smoke alarms in your home.  see an eye doctor and dentist regularly.  never drive under the influence of alcohol or drugs (including prescription drugs).  those with fair skin should take precautions against the sun. please let me know what your wishes would be, if artificial life support measures should become necessary.  it is critically important to prevent falling down (keep floor areas well-lit, dry, and free of loose objects.  If you have a cane, walker, or wheelchair, you should use it, even for short trips around the house.  Also, try not to rush).   Please come back for a follow-up appointment in 6 months.    

## 2013-12-30 NOTE — Telephone Encounter (Signed)
Returned call and pt verified x 2.  Pt c/o SOB and isn't sure what's causing it.  Stated he had his physical exam today and they can't figure out why he's SOB.  Pt stated he doesn't know if it is the amiodarone b/c it's a strong med or the pacemaker.  Pt c/o SOB x 3 weeks.  Denied other symptoms w/ SOB.  No CP.    Pt advised to do a transmission today and informed Shakila and Dr. Loletha Grayer will be notified.  Pt stated he has another appt and cannot do the transmission until after 5 pm.  Pt advised to transmit as soon as possible and informed he will be notified of plan once reviewed.  Advised ER for worsening symptoms or call the office if more concerned for OV.  Pt also informed amiodarone can cause respiratory problems.  Pt verbalized understanding and agreed w/ plan.  Message forwarded to Dr. Rhett Bannister, CMA r/t device/monitor concerns.

## 2013-12-30 NOTE — Telephone Encounter (Signed)
Called [phone] and got a drug quantity limitations request form.  Filled this out , dx : Barrett's Esophagus with hx of atypia 530.85 , Gerd 530.81.  Faxed form back to [phone].  The lady on the phone said it could take up to 72 hours to hear back from them.

## 2013-12-30 NOTE — Telephone Encounter (Signed)
Pt is on a pacemaker and he is having SOB. He stated that he can't figure out why he is having SOB.  [place]

## 2013-12-30 NOTE — Progress Notes (Signed)
Subjective:    Patient ID: [name] [name], male    DOB: Aug 06, 1929, 80 y.o.   MRN: [id number]  HPI Pt is here for regular wellness examination, and is feeling pretty well in general, and says chronic med probs are stable, except as noted below Pt says he has had zostavax.   Past Medical History  Diagnosis Date  . GASTRIC POLYP 05/13/2009  . COLONIC POLYPS, ADENOMATOUS 01/14/2008  . HYPERLIPIDEMIA 06/01/2007  . ANEMIA, IRON DEFICIENCY 10/13/2008  . COAGULOPATHY, COUMADIN-INDUCED 01/14/2008  . ANXIETY 09/04/2008  . HYPERTENSION 06/01/2007  . ATRIAL FIBRILLATION, CHRONIC 01/14/2008    on warfarin since 2007  . UNSPECIFIED VENOUS INSUFFICIENCY 09/18/2008  . PLEURAL EFFUSION, LEFT 10/13/2008  . GERD 06/01/2007  . BARRETTS ESOPHAGUS 03/20/2009  . PROSTATE CANCER, HX OF 01/14/2008  . CATARACTS, BILATERAL, HX OF 01/14/2008  . CEREBROVASCULAR ACCIDENT, HX OF 08/18/2008  . PERSONAL HX COLONIC POLYPS 01/01/2010  . BASAL CELL CARCINOMA OF SKIN SITE UNSPECIFIED 09/28/2010  . HYPERTHYROIDISM 01/06/2011  . Hyperglycemia   . Elevated PSA   . Restless leg syndrome   . Mitral regurgitation 06/13/2008    mitral valve replacement #20 St. Jude Biocor; Maze procedure by Dr. Amador Cunas  at [facility]  . CORONARY ARTERY DISEASE 06/01/2007    Cath 04/03/2008; Cath 11/01/1990  . Tachy-brady syndrome 07/13/2008    medtronic Adapta gen. model ADDR01 ,serial # [id number] H by Dr  [name] at [facility]  . Peripheral artery disease 06/16/2010    LEA doppler-left ABI nml 0.61 calcified vessels;right ABI  0.68  . Edema, peripheral 07/30/2009    LEV doppler- no thrombus or thrombophlebitis  . Aortic valvular disorder 08/16/2012    echo EF 45-50% LV mildly reduce,Biocor mitral valve,mild to mod. tricuspid regrug.,mild to mod. aortic regrug.  . Mitral valve disorder 09/08/2011    echo EF 50-55% LV normal  . Aortic valvular stenosis 11/19/2013  . Cardiomyopathy, ischemic 11/19/2013    EF 45-50% with inferior wall hypokinesis by echo  2014    Past Surgical History  Procedure Laterality Date  . Prostatectomy    . Tonsillectomy    . Cystectomy      Left leg  . Pacemaker placement  07/13/2008    Adapata dual chamber-Medtronic at [facility]   . Coronary artery bypass graft  06/13/2008    graft x1 w/vein graft LAD artery by Dr. Amador Cunas at [facility]  . Mitral valve replacement  06/13/2008    #20 St. Jude Bicor mitral valve ;Maze procedure at [facility] by Dr. Amador Cunas  . Cardiac catheterization  11/01/1990  . Skin cancer excision      removed from forehead and right leg, nose/face  . Upper gastrointestinal endoscopy  07/14/2009    erosive reflux esopagitis, Barrett's esophagus  . Colonoscopy  07/14/2009    diverticulosis, internal hemorrhoids  . Cardiovascular stress test  06/03/2010    Persantine perfusion EF 45% mild to moderate ischemia basal and mid inferolateral region  . Cardiovascular stress test  01/01/2008    left ventricle normal,no significant ischemia  . Cardiovascular stress test  09/09/2005     possibility of ischemia inferior wall    History   Social History  . Marital Status: Married    Spouse Name: N/A    Number of Children: 4  . Years of Education: N/A   Occupational History  . Retired    Social History Main Topics  . Smoking status: Former Smoker -- 2 years    Types: Pipe  Quit date: 11/07/1965  . Smokeless tobacco: Never Used     Comment: quit over 40 years ago, never smoked cigarettes  . Alcohol Use: No  . Drug Use: No  . Sexual Activity: Not on file   Other Topics Concern  . Not on file   Social History Narrative  . No narrative on file    Current Outpatient Prescriptions on File Prior to Visit  Medication Sig Dispense Refill  . amiodarone (PACERONE) 200 MG tablet Take 1 tablet (200 mg total) by mouth daily.  90 tablet  1  . aspirin EC 81 MG tablet Take 81 mg by mouth daily.      . cephALEXin (KEFLEX) 500 MG capsule Take 1 capsule (500 mg total) by mouth 3 (three) times daily.  21 capsule   0  . ferrous sulfate 325 (65 FE) MG tablet Take 325 mg by mouth daily with breakfast.      . methimazole (TAPAZOLE) 5 MG tablet Take 1 tablet (5 mg total) by mouth 3 (three) times a week. On Monday, Wednesday and Friday  90 tablet  0  . metoprolol succinate (TOPROL-XL) 100 MG 24 hr tablet Take 1 tablet (100 mg total) by mouth daily. Take with or immediately following a meal.  90 tablet  3  . Multiple Vitamin (MULTIVITAMIN WITH MINERALS) TABS Take 1 tablet by mouth daily. Centrum Silver      . pantoprazole (PROTONIX) 40 MG tablet Take 1 tablet (40 mg total) by mouth 2 (two) times daily.  180 tablet  3  . simvastatin (ZOCOR) 40 MG tablet Take 1 tablet (40 mg total) by mouth at bedtime.  90 tablet  2  . Tamsulosin HCl (FLOMAX) 0.4 MG CAPS Take by mouth every evening.       . warfarin (COUMADIN) 3 MG tablet Take 1 to 1 & 1/2 tablets by mouth daily as directed  135 tablet  1   No current facility-administered medications on file prior to visit.    No Known Allergies  Family History  Problem Relation Age of Onset  . Cancer Mother     Breast Cancer    BP 118/82  Pulse 94  Temp(Src) 97 F (36.1 C) (Oral)  Ht 5\' 10"  (1.778 m)  Wt 176 lb (79.833 kg)  BMI 25.25 kg/m2  Review of Systems  Constitutional: Negative for fever and unexpected weight change.  HENT: Negative for hearing loss.   Eyes: Negative for visual disturbance.  Respiratory:       No change in chronic doe  Cardiovascular: Negative for chest pain.  Gastrointestinal: Negative for blood in stool.  Endocrine: Positive for cold intolerance.  Genitourinary: Negative for hematuria.  Musculoskeletal: Negative for back pain.  Skin: Negative for rash.  Allergic/Immunologic: Negative for environmental allergies.  Neurological: Negative for syncope.  Hematological: Bruises/bleeds easily.  Psychiatric/Behavioral: Negative for dysphoric mood.       Objective:   Physical Exam VS: see vs page GEN: no distress HEAD: head: no  deformity eyes: no periorbital swelling, no proptosis external nose and ears are normal mouth: no lesion seen NECK: supple, thyroid is not enlarged.   CHEST WALL: no deformity.  Pacemaker is present.  Old healed surgical scar (median sternotomy).  Increased AP diameter.  LUNGS: clear to auscultation, except for decreased BS at the bases. BREASTS:  No gynecomastia. CV: reg rate and rhythm; 3/6 systolic murmur.   ABD: abdomen is soft, nontender.  no hepatosplenomegaly.  not distended.  no hernia GENITALIA/RECTAL/PROSTATE:  sees  urology.   MUSCULOSKELETAL: muscle bulk and strength are grossly normal.  no obvious joint swelling.  gait is normal and steady EXTEMITIES: no deformity.  no ulcer on the feet.  feet are of normal color and temp.  trace bilat leg edema.  There are bilateral onychomycosis and varicosities. PULSES: dorsalis pedis intact bilat.  no carotid bruit NEURO:  cn 2-12 grossly intact.   readily moves all 4's.  sensation is intact to touch on the feet SKIN:  Normal texture and temperature.  No rash or suspicious lesion is visible.  Violaceous discoloration at the hands and ankles. NODES:  None palpable at the neck PSYCH: alert, well-oriented.  Does not appear anxious nor depressed.     Assessment & Plan:  Wellness visit today, with problems stable, except as noted. we discussed code status.  pt requests full code, but would not want to be started or maintained on artificial life-support measures if there was not a reasonable chance of recovery.

## 2013-12-31 DIAGNOSIS — Z0000 Encounter for general adult medical examination without abnormal findings: Secondary | ICD-10-CM | POA: Insufficient documentation

## 2013-12-31 NOTE — Telephone Encounter (Signed)
Appointment made for 01-01-2014 @ 11:45am.

## 2014-01-01 ENCOUNTER — Ambulatory Visit (INDEPENDENT_AMBULATORY_CARE_PROVIDER_SITE_OTHER): Payer: Medicare Other | Admitting: Cardiovascular Disease

## 2014-01-01 ENCOUNTER — Encounter: Payer: Medicare Other | Admitting: Cardiovascular Disease

## 2014-01-01 ENCOUNTER — Ambulatory Visit: Payer: Medicare Other | Admitting: *Deleted

## 2014-01-01 ENCOUNTER — Encounter: Payer: Self-pay | Admitting: Cardiovascular Disease

## 2014-01-01 VITALS — BP 108/84 | HR 78 | Resp 20 | Ht 70.0 in | Wt 177.0 lb

## 2014-01-01 DIAGNOSIS — R5381 Other malaise: Secondary | ICD-10-CM

## 2014-01-01 DIAGNOSIS — R5383 Other fatigue: Secondary | ICD-10-CM

## 2014-01-01 DIAGNOSIS — R0602 Shortness of breath: Secondary | ICD-10-CM

## 2014-01-01 DIAGNOSIS — Z79899 Other long term (current) drug therapy: Secondary | ICD-10-CM

## 2014-01-01 DIAGNOSIS — R10819 Abdominal tenderness, unspecified site: Secondary | ICD-10-CM

## 2014-01-01 DIAGNOSIS — D689 Coagulation defect, unspecified: Secondary | ICD-10-CM

## 2014-01-01 DIAGNOSIS — I350 Nonrheumatic aortic (valve) stenosis: Secondary | ICD-10-CM

## 2014-01-01 DIAGNOSIS — I495 Sick sinus syndrome: Secondary | ICD-10-CM

## 2014-01-01 DIAGNOSIS — I359 Nonrheumatic aortic valve disorder, unspecified: Secondary | ICD-10-CM

## 2014-01-01 LAB — MDC_IDC_ENUM_SESS_TYPE_INCLINIC
Battery Remaining Longevity: 45 mo
Battery Voltage: 2.77 V
Brady Statistic AP VS Percent: 0 %
Brady Statistic AS VP Percent: 0 %
Brady Statistic AS VS Percent: 0 %
Date Time Interrogation Session: 20150225134828
Lead Channel Impedance Value: 420 Ohm
Lead Channel Impedance Value: 746 Ohm
Lead Channel Pacing Threshold Amplitude: 0.5 V
Lead Channel Pacing Threshold Amplitude: 0.5 V
Lead Channel Pacing Threshold Pulse Width: 0.4 ms
Lead Channel Setting Pacing Amplitude: 2 V
Lead Channel Setting Pacing Amplitude: 2 V
MDC IDC MSMT BATTERY IMPEDANCE: 1196 Ohm
MDC IDC MSMT LEADCHNL RV PACING THRESHOLD PULSEWIDTH: 0.4 ms
MDC IDC SET LEADCHNL RV PACING PULSEWIDTH: 0.4 ms
MDC IDC SET LEADCHNL RV SENSING SENSITIVITY: 2.8 mV
MDC IDC STAT BRADY AP VP PERCENT: 100 %

## 2014-01-01 LAB — PACEMAKER DEVICE OBSERVATION

## 2014-01-01 NOTE — Progress Notes (Unsigned)
Pacemaker check in clinic with [facility]. Normal device function. No changes made this session.

## 2014-01-01 NOTE — Patient Instructions (Signed)
Dr. [name] has requested that you have a Right and Left cardiac catheterization Wednesday March 4th in the afternoon. Cardiac catheterization is used to diagnose and/or treat various heart conditions. Doctors may recommend this procedure for a number of different reasons. The most common reason is to evaluate chest pain. Chest pain can be a symptom of coronary artery disease (CAD), and cardiac catheterization can show whether plaque is narrowing or blocking your heart's arteries. This procedure is also used to evaluate the valves, as well as measure the blood flow and oxygen levels in different parts of your heart. For further information please visit [email or web address]. Please follow instruction sheet, as given.  Dr. [name] recommends that you return for lab work in: 3-5 days prior to the cardiac cath.  A referral has been made to Dr. Gerrit Halls in the [facility].

## 2014-01-02 ENCOUNTER — Encounter (HOSPITAL_COMMUNITY): Payer: Self-pay | Admitting: Pharmacy Technician

## 2014-01-03 ENCOUNTER — Encounter: Payer: Self-pay | Admitting: Cardiovascular Disease

## 2014-01-03 NOTE — Progress Notes (Signed)
Patient ID: [name] [name], male   DOB: [birth year], 80 y.o.   MRN: [id number]      Reason for office visit Shortness of breath, aortic stenosis  Less than 2 months have passed since Mr. Pinkerton last appointment and he now has noticed progressively worsening exertional dyspnea. Previously asymptomatic, he now appears to have NYHA class II dyspnea on exertion. Denies chest pain or syncope with activity.  His cardiac problems date back to 2009 when he was diagnosed with severe mitral insufficiency related to mitral valve prolapse. He was also having problems with paroxysmal atrial fibrillation. Coronary angiography demonstrated moderate coronary artery disease primarily a 70% calcified eccentric proximal LAD lesion with moderate stenoses in the other 2 major coronary arteries. He was referred for robotic mitral valve repair by Dr. Amador Cunas at [facility]. The LIMA was found to be a very small vessel. He received a saphenous vein graft bypass to the LAD. Cryoablation was performed for atrial fibrillation. The mitral valve was replaced with a 29 mm St. Jude Biocor prosthesis. The postoperative course was complicated by an embolic stroke from which she has recovered without sequelae. He received a dual-chamber permanent pacemaker (Medtronic adapta) and is pacemaker dependent with 100% pacing in both the atrium and the ventricle.  He has remained on amiodarone therapy with an extremely low burden of atrial fibrillation. As far as I can tell from the record the last documentation of atrial fibrillation was a one-hour episode that occurred in May of 2012. He also remains on warfarin therapy.  Subsequently, he has developed progressive aortic valve disease and has at least moderate aortic stenosis with mild to moderate aortic insufficiency. There is some discrepancy between the estimated aortic valve area at 0.7 cm and the aortic gradients (mean 29 mm Hg). Biological mitral valve prosthesis shows mild stenosis with a mean  gradient of 7 mm Hg (heart rate not specified). He has a severely dilated left atrium and mild tricuspid insufficiency.  Left ventricular systolic function is mildly depressed with an EF of 45-50% primarily due to RV pacing induced asynchrony.  He has mild hyperthyroidism that is well controlled on low dose of methimazole. I suspect this is amiodaronel related    No Known Allergies  Current Outpatient Prescriptions  Medication Sig Dispense Refill  . amiodarone (PACERONE) 200 MG tablet Take 1 tablet (200 mg total) by mouth daily.  90 tablet  1  . aspirin EC 81 MG tablet Take 81 mg by mouth daily.      . ferrous sulfate 325 (65 FE) MG tablet Take 325 mg by mouth daily with breakfast.      . methimazole (TAPAZOLE) 5 MG tablet Take 1 tablet (5 mg total) by mouth 3 (three) times a week. On Monday, Wednesday and Friday  90 tablet  0  . metoprolol succinate (TOPROL-XL) 100 MG 24 hr tablet Take 1 tablet (100 mg total) by mouth daily. Take with or immediately following a meal.  90 tablet  3  . Multiple Vitamin (MULTIVITAMIN WITH MINERALS) TABS Take 1 tablet by mouth daily. Centrum Silver      . pantoprazole (PROTONIX) 40 MG tablet Take 1 tablet (40 mg total) by mouth 2 (two) times daily.  180 tablet  3  . simvastatin (ZOCOR) 40 MG tablet Take 1 tablet (40 mg total) by mouth at bedtime.  90 tablet  2  . Tamsulosin HCl (FLOMAX) 0.4 MG CAPS Take 0.4 mg by mouth every evening.       . warfarin (  COUMADIN) 3 MG tablet Take 1.5-3 mg by mouth daily. Take 1/2 tablet (1.52m) on Monday, Wednesday, and Friday. All other days take a whole tablet of 372m      No current facility-administered medications for this visit.    Past Medical History  Diagnosis Date  . GASTRIC POLYP 05/13/2009  . COLONIC POLYPS, ADENOMATOUS 01/14/2008  . HYPERLIPIDEMIA 06/01/2007  . ANEMIA, IRON DEFICIENCY 10/13/2008  . COAGULOPATHY, COUMADIN-INDUCED 01/14/2008  . ANXIETY 09/04/2008  . HYPERTENSION 06/01/2007  . ATRIAL FIBRILLATION,  CHRONIC 01/14/2008    on warfarin since 2007  . UNSPECIFIED VENOUS INSUFFICIENCY 09/18/2008  . PLEURAL EFFUSION, LEFT 10/13/2008  . GERD 06/01/2007  . BARRETTS ESOPHAGUS 03/20/2009  . PROSTATE CANCER, HX OF 01/14/2008  . CATARACTS, BILATERAL, HX OF 01/14/2008  . CEREBROVASCULAR ACCIDENT, HX OF 08/18/2008  . PERSONAL HX COLONIC POLYPS 01/01/2010  . BASAL CELL CARCINOMA OF SKIN SITE UNSPECIFIED 09/28/2010  . HYPERTHYROIDISM 01/06/2011  . Hyperglycemia   . Elevated PSA   . Restless leg syndrome   . Mitral regurgitation 06/13/2008    mitral valve replacement #20 St. Jude Biocor; [facility]. CORONARY ARTERY DISEASE 06/01/2007    Cath 04/03/2008; Cath 11/01/1990  . Tachy-brady syndrome 07/13/2008    medtronic Adapta gen. model ADDR01 ,serial # [id number] [facility]. Peripheral artery disease 06/16/2010    LEA doppler-left ABI nml 0.61 calcified vessels;right ABI  0.68  . Edema, peripheral 07/30/2009    LEV doppler- no thrombus or thrombophlebitis  . Aortic valvular disorder 08/16/2012    echo EF 45-50% LV mildly reduce,Biocor mitral valve,mild to mod. tricuspid regrug.,mild to mod. aortic regrug.  . Mitral valve disorder 09/08/2011    echo EF 50-55% LV normal  . Aortic valvular stenosis 11/19/2013  . Cardiomyopathy, ischemic 11/19/2013    EF 45-50% with inferior wall hypokinesis by echo 2014    Past Surgical History  Procedure Laterality Date  . Prostatectomy    . Tonsillectomy    . Cystectomy      Left leg  . Pacemaker placement  07/13/2008    [facility] . Coronary artery bypass graft  06/13/2008    graft x1 w/vein graft LAD artery [facility]. Mitral valve replacement  06/13/2008    #20 St. Jude Bicor mitral valve ;[facility]. Cardiac catheterization  11/01/1990  . Skin cancer excision      removed from forehead and right leg, nose/face  . Upper gastrointestinal  endoscopy  07/14/2009    erosive reflux esopagitis, Barrett's esophagus  . Colonoscopy  07/14/2009    diverticulosis, internal hemorrhoids  . Cardiovascular stress test  06/03/2010    Persantine perfusion EF 45% mild to moderate ischemia basal and mid inferolateral region  . Cardiovascular stress test  01/01/2008    left ventricle normal,no significant ischemia  . Cardiovascular stress test  09/09/2005     possibility of ischemia inferior wall    Family History  Problem Relation Age of Onset  . Cancer Mother     Breast Cancer    History   Social History  . Marital Status: Married    Spouse Name: N/A    Number of Children: 4  . Years of Education: N/A   Occupational History  . Retired    Social History Main Topics  . Smoking status: Former Smoker --  2 years    Types: Pipe    Quit date: 11/07/1965  . Smokeless tobacco: Never Used     Comment: quit over 40 years ago, never smoked cigarettes  . Alcohol Use: No  . Drug Use: No  . Sexual Activity: Not on file   Other Topics Concern  . Not on file   Social History Narrative  . No narrative on file    Review of systems: The patient specifically denies any chest pain at rest or with exertion, dyspnea at rest, orthopnea, paroxysmal nocturnal dyspnea, syncope, palpitations, focal neurological deficits, intermittent claudication, lower extremity edema, unexplained weight gain, cough, hemoptysis or wheezing.  The patient also denies abdominal pain, nausea, vomiting, dysphagia, diarrhea, constipation, polyuria, polydipsia, dysuria, hematuria, frequency, urgency, abnormal bleeding or bruising, fever, chills, unexpected weight changes, mood swings, change in skin or hair texture, change in voice quality, auditory or visual problems, allergic reactions or rashes, new musculoskeletal complaints other than usual "aches and pains".   PHYSICAL EXAM BP 108/84  Pulse 78  Resp 20  Ht 5' 10"  (1.778 m)  Wt 80.287 kg (177 lb)  BMI 25.40  kg/m2 General: Alert, oriented x3, no distress; he has prominent thoracic kyphosis  Head: no evidence of trauma, PERRL, EOMI, no exophtalmos or lid lag, no myxedema, no xanthelasma; normal ears, nose and oropharynx  Neck: normal jugular venous pulsations and no hepatojugular reflux; brisk carotid pulses without delay and no carotid bruits  Chest: clear to auscultation, no signs of consolidation by percussion or palpation, normal fremitus, symmetrical and full respiratory excursions. Mild sternal deformity  Cardiovascular: normal position and quality of the apical impulse, regular rhythm, normal first and second heart sounds, no diastolic murmurs, rubs or gallops. Mid-peaking grade 3/6 aortic ejection murmur heard throughout the entire precordium loudest at the right upper sternal border  Abdomen: no tenderness or distention, no masses by palpation, no abnormal pulsatility or arterial bruits, normal bowel sounds, no hepatosplenomegaly  Extremities: no clubbing, cyanosis or edema; 2+ radial, ulnar and brachial pulses bilaterally; 2+ right femoral, posterior tibial and dorsalis pedis pulses; 2+ left femoral, posterior tibial and dorsalis pedis pulses; no subclavian or femoral bruits  Neurological: grossly nonfocal   EKG: AV sequential pacing  Lipid Panel     Component Value Date/Time   CHOL 112 12/23/2013 0901   TRIG 62.0 12/23/2013 0901   HDL 41.50 12/23/2013 0901   CHOLHDL 3 12/23/2013 0901   VLDL 12.4 12/23/2013 0901   LDLCALC 58 12/23/2013 0901    BMET    Component Value Date/Time   NA 143 12/05/2013 1654   K 4.0 12/05/2013 1654   CL 106 12/05/2013 1654   CO2 31 12/05/2013 1654   GLUCOSE 87 12/05/2013 1654   BUN 19 12/05/2013 1654   CREATININE 0.8 12/05/2013 1654   CREATININE 1.02 07/26/2013 1005   CALCIUM 8.8 12/05/2013 1654   GFRNONAA 68.66 01/01/2010 0811   GFRAA 121 10/21/2008 1006     ASSESSMENT AND PLAN  Mr. Kamau has developed shortness of breath on exertion and the concern is that  this may be a sign of progressive aortic stenosis. His last echocardiogram showed conflicting values for his aortic valve area (severe left ear) and a mean transaortic valve gradient (moderate left ear).  Have recommended that he undergo right and left heart catheterization. The risks and benefits of this procedure were discussed in detail he understands and agrees to proceed. A femoral approach will be used since he has had previous bypass.  If he indeed has symptomatic aortic stenosis with critical values by hemodynamic measurement, he is probably best suited for TAVR. He has had a previous sternotomy after failed attempts at robotic mitral valve surgery. Redo sternotomy would probably be high risk in this octogenarian.  Orders Placed This Encounter  Procedures  . CBC  . Comp Met (CMET)  . Protime-INR  . PTT  . Ambulatory referral to Cardiology  . LEFT AND RIGHT HEART CATHETERIZATION WITH CORONARY ANGIOGRAM   No orders of the defined types were placed in this encounter.    Holli Humbles, MD, [place] [phone] office [phone] pager

## 2014-01-06 LAB — CBC
HCT: 36.9 % — ABNORMAL LOW (ref 39.0–52.0)
Hemoglobin: 12.4 g/dL — ABNORMAL LOW (ref 13.0–17.0)
MCH: 30.5 pg (ref 26.0–34.0)
MCHC: 33.6 g/dL (ref 30.0–36.0)
MCV: 90.7 fL (ref 78.0–100.0)
PLATELETS: 161 10*3/uL (ref 150–400)
RBC: 4.07 MIL/uL — AB (ref 4.22–5.81)
RDW: 14.9 % (ref 11.5–15.5)
WBC: 4.6 10*3/uL (ref 4.0–10.5)

## 2014-01-06 LAB — COMPREHENSIVE METABOLIC PANEL
ALT: 14 U/L (ref 0–53)
AST: 17 U/L (ref 0–37)
Albumin: 3.9 g/dL (ref 3.5–5.2)
Alkaline Phosphatase: 52 U/L (ref 39–117)
BUN: 20 mg/dL (ref 6–23)
CALCIUM: 8.7 mg/dL (ref 8.4–10.5)
CHLORIDE: 107 meq/L (ref 96–112)
CO2: 29 meq/L (ref 19–32)
CREATININE: 1 mg/dL (ref 0.50–1.35)
Glucose, Bld: 91 mg/dL (ref 70–99)
POTASSIUM: 4.2 meq/L (ref 3.5–5.3)
SODIUM: 143 meq/L (ref 135–145)
TOTAL PROTEIN: 6.1 g/dL (ref 6.0–8.3)
Total Bilirubin: 0.7 mg/dL (ref 0.2–1.2)

## 2014-01-06 LAB — APTT: aPTT: 36 seconds (ref 24–37)

## 2014-01-06 LAB — PROTIME-INR
INR: 1.34 (ref <1.50)
Prothrombin Time: 16.4 seconds — ABNORMAL HIGH (ref 11.6–15.2)

## 2014-01-07 ENCOUNTER — Telehealth: Payer: Self-pay | Admitting: Cardiovascular Disease

## 2014-01-07 NOTE — Telephone Encounter (Signed)
Per Janett Billow at [facility] the drug quantity limitations request has been approved from 12/30/13-12/30/14.  Patient informed.

## 2014-01-07 NOTE — Telephone Encounter (Signed)
Spoke with patient and informed him of the appointment with Dr. Burt Knack at [facility] 01/14/14 @ 9:00am..  I also gave him the address and phone number.  He voiced understanding.

## 2014-01-08 ENCOUNTER — Encounter (HOSPITAL_COMMUNITY)
Admission: RE | Disposition: A | Discharge status: Home / Self Care | Payer: Self-pay | Source: Ambulatory Visit | Attending: Cardiovascular Disease

## 2014-01-08 ENCOUNTER — Ambulatory Visit (HOSPITAL_COMMUNITY)
Admission: RE | Admit: 2014-01-08 | Discharge: 2014-01-08 | Disposition: A | Discharge status: Home / Self Care | Payer: Medicare Other | Source: Ambulatory Visit | Attending: Cardiovascular Disease | Admitting: Cardiovascular Disease

## 2014-01-08 DIAGNOSIS — I25810 Atherosclerosis of coronary artery bypass graft(s) without angina pectoris: Secondary | ICD-10-CM | POA: Insufficient documentation

## 2014-01-08 DIAGNOSIS — I255 Ischemic cardiomyopathy: Secondary | ICD-10-CM

## 2014-01-08 DIAGNOSIS — I350 Nonrheumatic aortic (valve) stenosis: Secondary | ICD-10-CM

## 2014-01-08 DIAGNOSIS — I700 Atherosclerosis of aorta: Secondary | ICD-10-CM | POA: Insufficient documentation

## 2014-01-08 DIAGNOSIS — Z7982 Long term (current) use of aspirin: Secondary | ICD-10-CM | POA: Insufficient documentation

## 2014-01-08 DIAGNOSIS — I739 Peripheral vascular disease, unspecified: Secondary | ICD-10-CM | POA: Insufficient documentation

## 2014-01-08 DIAGNOSIS — Z7901 Long term (current) use of anticoagulants: Secondary | ICD-10-CM | POA: Insufficient documentation

## 2014-01-08 DIAGNOSIS — Z87891 Personal history of nicotine dependence: Secondary | ICD-10-CM | POA: Insufficient documentation

## 2014-01-08 DIAGNOSIS — I079 Rheumatic tricuspid valve disease, unspecified: Secondary | ICD-10-CM | POA: Insufficient documentation

## 2014-01-08 DIAGNOSIS — I10 Essential (primary) hypertension: Secondary | ICD-10-CM | POA: Insufficient documentation

## 2014-01-08 DIAGNOSIS — F411 Generalized anxiety disorder: Secondary | ICD-10-CM | POA: Insufficient documentation

## 2014-01-08 DIAGNOSIS — I050 Rheumatic mitral stenosis: Secondary | ICD-10-CM | POA: Insufficient documentation

## 2014-01-08 DIAGNOSIS — I4891 Unspecified atrial fibrillation: Secondary | ICD-10-CM | POA: Insufficient documentation

## 2014-01-08 DIAGNOSIS — Z8546 Personal history of malignant neoplasm of prostate: Secondary | ICD-10-CM | POA: Insufficient documentation

## 2014-01-08 DIAGNOSIS — Z954 Presence of other heart-valve replacement: Secondary | ICD-10-CM | POA: Insufficient documentation

## 2014-01-08 DIAGNOSIS — G2581 Restless legs syndrome: Secondary | ICD-10-CM | POA: Insufficient documentation

## 2014-01-08 DIAGNOSIS — E785 Hyperlipidemia, unspecified: Secondary | ICD-10-CM | POA: Insufficient documentation

## 2014-01-08 DIAGNOSIS — I359 Nonrheumatic aortic valve disorder, unspecified: Secondary | ICD-10-CM

## 2014-01-08 DIAGNOSIS — I509 Heart failure, unspecified: Secondary | ICD-10-CM | POA: Insufficient documentation

## 2014-01-08 DIAGNOSIS — I428 Other cardiomyopathies: Secondary | ICD-10-CM | POA: Insufficient documentation

## 2014-01-08 DIAGNOSIS — K219 Gastro-esophageal reflux disease without esophagitis: Secondary | ICD-10-CM | POA: Insufficient documentation

## 2014-01-08 DIAGNOSIS — Z8673 Personal history of transient ischemic attack (TIA), and cerebral infarction without residual deficits: Secondary | ICD-10-CM | POA: Insufficient documentation

## 2014-01-08 DIAGNOSIS — E0590 Thyrotoxicosis, unspecified without thyrotoxic crisis or storm: Secondary | ICD-10-CM | POA: Insufficient documentation

## 2014-01-08 DIAGNOSIS — I2510 Atherosclerotic heart disease of native coronary artery without angina pectoris: Secondary | ICD-10-CM | POA: Insufficient documentation

## 2014-01-08 HISTORY — PX: LEFT AND RIGHT HEART CATHETERIZATION WITH CORONARY ANGIOGRAM: SHX5449

## 2014-01-08 LAB — POCT I-STAT 3, VENOUS BLOOD GAS (G3P V)
Acid-base deficit: 3 mmol/L — ABNORMAL HIGH (ref 0.0–2.0)
Bicarbonate: 23.5 mEq/L (ref 20.0–24.0)
O2 SAT: 57 %
PCO2 VEN: 48.7 mmHg (ref 45.0–50.0)
TCO2: 25 mmol/L (ref 0–100)
pH, Ven: 7.292 (ref 7.250–7.300)
pO2, Ven: 33 mmHg (ref 30.0–45.0)

## 2014-01-08 LAB — POCT I-STAT 3, ART BLOOD GAS (G3+)
Acid-base deficit: 1 mmol/L (ref 0.0–2.0)
Bicarbonate: 24.4 mEq/L — ABNORMAL HIGH (ref 20.0–24.0)
O2 Saturation: 95 %
PCO2 ART: 40.4 mmHg (ref 35.0–45.0)
PO2 ART: 75 mmHg — AB (ref 80.0–100.0)
TCO2: 26 mmol/L (ref 0–100)
pH, Arterial: 7.388 (ref 7.350–7.450)

## 2014-01-08 SURGERY — LEFT AND RIGHT HEART CATHETERIZATION WITH CORONARY ANGIOGRAM
Anesthesia: LOCAL

## 2014-01-08 MED ORDER — SODIUM CHLORIDE 0.9 % IJ SOLN: 3.0000 mL | Freq: Two times a day (BID) | INTRAMUSCULAR | Status: DC

## 2014-01-08 MED ORDER — SODIUM CHLORIDE 0.9 % IV SOLN
250.0000 mL | INTRAVENOUS | Status: DC | PRN
  Administered 2014-01-08: 12:00:00 via INTRAVENOUS

## 2014-01-08 MED ORDER — LIDOCAINE HCL (PF) 1 % IJ SOLN
INTRAMUSCULAR | Status: AC
  Filled 2014-01-08: qty 30

## 2014-01-08 MED ORDER — NITROGLYCERIN 0.2 MG/ML ON CALL CATH LAB
INTRAVENOUS | Status: AC
  Filled 2014-01-08: qty 1

## 2014-01-08 MED ORDER — SODIUM CHLORIDE 0.9 % IJ SOLN: 3.0000 mL | INTRAMUSCULAR | Status: DC | PRN

## 2014-01-08 MED ORDER — SODIUM CHLORIDE 0.9 % IV SOLN: 1.0000 mL/kg/h | INTRAVENOUS | Status: DC

## 2014-01-08 MED ORDER — ASPIRIN 81 MG PO CHEW: 81.0000 mg | CHEWABLE_TABLET | ORAL | Status: DC

## 2014-01-08 MED ORDER — MIDAZOLAM HCL 2 MG/2ML IJ SOLN
INTRAMUSCULAR | Status: AC
  Filled 2014-01-08: qty 2

## 2014-01-08 MED ORDER — FENTANYL CITRATE 0.05 MG/ML IJ SOLN
INTRAMUSCULAR | Status: AC
  Filled 2014-01-08: qty 2

## 2014-01-08 MED ORDER — ASPIRIN 81 MG PO CHEW: 81.0000 mg | CHEWABLE_TABLET | Freq: Every day | ORAL | Status: DC

## 2014-01-08 MED ORDER — HEPARIN (PORCINE) IN NACL 2-0.9 UNIT/ML-% IJ SOLN
INTRAMUSCULAR | Status: AC
  Filled 2014-01-08: qty 1500

## 2014-01-08 NOTE — Interval H&P Note (Signed)
History and Physical Interval Note:  01/08/2014 8:17 AM  [name] [name]  has presented today for surgery, with the diagnosis of shortness of breath  The various methods of treatment have been discussed with the patient and family. After consideration of risks, benefits and other options for treatment, the patient has consented to  Procedure(s): LEFT AND RIGHT HEART CATHETERIZATION WITH CORONARY ANGIOGRAM (N/A) as a surgical intervention .  The patient's history has been reviewed, patient examined, no change in status, stable for surgery.  I have reviewed the patient's chart and labs.  Questions were answered to the patient's satisfaction.     [name]

## 2014-01-08 NOTE — H&P (View-Only) (Signed)
Patient ID: [name] [name], male   DOB: 24-Nov-1928, 80 y.o.   MRN: [id number]      Reason for office visit Shortness of breath, aortic stenosis  Less than 2 months have passed since [name] [name] and he now has noticed progressively worsening exertional dyspnea. Previously asymptomatic, he now appears to have NYHA class II dyspnea on exertion. Denies chest pain or syncope with activity.  His cardiac problems date back to 2009 when he was diagnosed with severe mitral insufficiency related to mitral valve prolapse. He was also having problems with paroxysmal atrial fibrillation. Coronary angiography demonstrated moderate coronary artery disease primarily a 70% calcified eccentric proximal LAD lesion with moderate stenoses in the other 2 major coronary arteries. He was referred for robotic mitral valve repair by Dr. Amador Cunas at [facility]. The LIMA was found to be a very small vessel. He received a saphenous vein graft bypass to the LAD. Cryoablation was performed for atrial fibrillation. The mitral valve was replaced with a 29 mm St. Jude Biocor prosthesis. The postoperative course was complicated by an embolic stroke from which she has recovered without sequelae. He received a dual-chamber permanent pacemaker (Medtronic adapta) and is pacemaker dependent with 100% pacing in both the atrium and the ventricle.  He has remained on amiodarone therapy with an extremely low burden of atrial fibrillation. As far as I can tell from the record the last documentation of atrial fibrillation was a one-hour episode that occurred in May of 2012. He also remains on warfarin therapy.  Subsequently, he has developed progressive aortic valve disease and has at least moderate aortic stenosis with mild to moderate aortic insufficiency. There is some discrepancy between the estimated aortic valve area at 0.7 cm and the aortic gradients (mean 29 mm Hg). Biological mitral valve prosthesis shows mild stenosis with a mean  gradient of 7 mm Hg (heart rate not specified). He has a severely dilated left atrium and mild tricuspid insufficiency.  Left ventricular systolic function is mildly depressed with an EF of 45-50% primarily due to RV pacing induced asynchrony.  He has mild hyperthyroidism that is well controlled on low dose of methimazole. I suspect this is amiodaronel related    No Known Allergies  Current Outpatient Prescriptions  Medication Sig Dispense Refill  . amiodarone (PACERONE) 200 MG tablet Take 1 tablet (200 mg total) by mouth daily.  90 tablet  1  . aspirin EC 81 MG tablet Take 81 mg by mouth daily.      . ferrous sulfate 325 (65 FE) MG tablet Take 325 mg by mouth daily with breakfast.      . methimazole (TAPAZOLE) 5 MG tablet Take 1 tablet (5 mg total) by mouth 3 (three) times a week. On Monday, Wednesday and Friday  90 tablet  0  . metoprolol succinate (TOPROL-XL) 100 MG 24 hr tablet Take 1 tablet (100 mg total) by mouth daily. Take with or immediately following a meal.  90 tablet  3  . Multiple Vitamin (MULTIVITAMIN WITH MINERALS) TABS Take 1 tablet by mouth daily. Centrum Silver      . pantoprazole (PROTONIX) 40 MG tablet Take 1 tablet (40 mg total) by mouth 2 (two) times daily.  180 tablet  3  . simvastatin (ZOCOR) 40 MG tablet Take 1 tablet (40 mg total) by mouth at bedtime.  90 tablet  2  . Tamsulosin HCl (FLOMAX) 0.4 MG CAPS Take 0.4 mg by mouth every evening.       . warfarin (  COUMADIN) 3 MG tablet Take 1.5-3 mg by mouth daily. Take 1/2 tablet (1.67m) on Monday, Wednesday, and Friday. All other days take a whole tablet of 389m      No current facility-administered medications for this visit.    Past Medical History  Diagnosis Date  . GASTRIC POLYP 05/13/2009  . COLONIC POLYPS, ADENOMATOUS 01/14/2008  . HYPERLIPIDEMIA 06/01/2007  . ANEMIA, IRON DEFICIENCY 10/13/2008  . COAGULOPATHY, COUMADIN-INDUCED 01/14/2008  . ANXIETY 09/04/2008  . HYPERTENSION 06/01/2007  . ATRIAL FIBRILLATION,  CHRONIC 01/14/2008    on warfarin since 2007  . UNSPECIFIED VENOUS INSUFFICIENCY 09/18/2008  . PLEURAL EFFUSION, LEFT 10/13/2008  . GERD 06/01/2007  . BARRETTS ESOPHAGUS 03/20/2009  . PROSTATE CANCER, HX OF 01/14/2008  . CATARACTS, BILATERAL, HX OF 01/14/2008  . CEREBROVASCULAR ACCIDENT, HX OF 08/18/2008  . PERSONAL HX COLONIC POLYPS 01/01/2010  . BASAL CELL CARCINOMA OF SKIN SITE UNSPECIFIED 09/28/2010  . HYPERTHYROIDISM 01/06/2011  . Hyperglycemia   . Elevated PSA   . Restless leg syndrome   . Mitral regurgitation 06/13/2008    mitral valve replacement #20 St. Jude Biocor; [facility]. CORONARY ARTERY DISEASE 06/01/2007    Cath 04/03/2008; Cath 11/01/1990  . Tachy-brady syndrome 07/13/2008    medtronic Adapta gen. model ADDR01 ,serial # [id number] [facility]. Peripheral artery disease 06/16/2010    LEA doppler-left ABI nml 0.61 calcified vessels;right ABI  0.68  . Edema, peripheral 07/30/2009    LEV doppler- no thrombus or thrombophlebitis  . Aortic valvular disorder 08/16/2012    echo EF 45-50% LV mildly reduce,Biocor mitral valve,mild to mod. tricuspid regrug.,mild to mod. aortic regrug.  . Mitral valve disorder 09/08/2011    echo EF 50-55% LV normal  . Aortic valvular stenosis 11/19/2013  . Cardiomyopathy, ischemic 11/19/2013    EF 45-50% with inferior wall hypokinesis by echo 2014    Past Surgical History  Procedure Laterality Date  . Prostatectomy    . Tonsillectomy    . Cystectomy      Left leg  . Pacemaker placement  07/13/2008    [facility] . Coronary artery bypass graft  06/13/2008    graft x1 w/vein graft LAD artery [facility]. Mitral valve replacement  06/13/2008    #20 St. Jude Bicor mitral valve ;[facility]. Cardiac catheterization  11/01/1990  . Skin cancer excision      removed from forehead and right leg, nose/face  . Upper gastrointestinal  endoscopy  07/14/2009    erosive reflux esopagitis, Barrett's esophagus  . Colonoscopy  07/14/2009    diverticulosis, internal hemorrhoids  . Cardiovascular stress test  06/03/2010    Persantine perfusion EF 45% mild to moderate ischemia basal and mid inferolateral region  . Cardiovascular stress test  01/01/2008    left ventricle normal,no significant ischemia  . Cardiovascular stress test  09/09/2005     possibility of ischemia inferior wall    Family History  Problem Relation Age of Onset  . Cancer Mother     Breast Cancer    History   Social History  . Marital Status: Married    Spouse Name: N/A    Number of Children: 4  . Years of Education: N/A   Occupational History  . Retired    Social History Main Topics  . Smoking status: Former Smoker --  2 years    Types: Pipe    Quit date: 11/07/1965  . Smokeless tobacco: Never Used     Comment: quit over 40 years ago, never smoked cigarettes  . Alcohol Use: No  . Drug Use: No  . Sexual Activity: Not on file   Other Topics Concern  . Not on file   Social History Narrative  . No narrative on file    Review of systems: The patient specifically denies any chest pain at rest or with exertion, dyspnea at rest, orthopnea, paroxysmal nocturnal dyspnea, syncope, palpitations, focal neurological deficits, intermittent claudication, lower extremity edema, unexplained weight gain, cough, hemoptysis or wheezing.  The patient also denies abdominal pain, nausea, vomiting, dysphagia, diarrhea, constipation, polyuria, polydipsia, dysuria, hematuria, frequency, urgency, abnormal bleeding or bruising, fever, chills, unexpected weight changes, mood swings, change in skin or hair texture, change in voice quality, auditory or visual problems, allergic reactions or rashes, new musculoskeletal complaints other than usual "aches and pains".   PHYSICAL EXAM BP 108/84  Pulse 78  Resp 20  Ht 5' 10"  (1.778 m)  Wt 80.287 kg (177 lb)  BMI 25.40  kg/m2 General: Alert, oriented x3, no distress; he has prominent thoracic kyphosis  Head: no evidence of trauma, PERRL, EOMI, no exophtalmos or lid lag, no myxedema, no xanthelasma; normal ears, nose and oropharynx  Neck: normal jugular venous pulsations and no hepatojugular reflux; brisk carotid pulses without delay and no carotid bruits  Chest: clear to auscultation, no signs of consolidation by percussion or palpation, normal fremitus, symmetrical and full respiratory excursions. Mild sternal deformity  Cardiovascular: normal position and quality of the apical impulse, regular rhythm, normal first and second heart sounds, no diastolic murmurs, rubs or gallops. Mid-peaking grade 3/6 aortic ejection murmur heard throughout the entire precordium loudest at the right upper sternal border  Abdomen: no tenderness or distention, no masses by palpation, no abnormal pulsatility or arterial bruits, normal bowel sounds, no hepatosplenomegaly  Extremities: no clubbing, cyanosis or edema; 2+ radial, ulnar and brachial pulses bilaterally; 2+ right femoral, posterior tibial and dorsalis pedis pulses; 2+ left femoral, posterior tibial and dorsalis pedis pulses; no subclavian or femoral bruits  Neurological: grossly nonfocal   EKG: AV sequential pacing  Lipid Panel     Component Value Date/Time   CHOL 112 12/23/2013 0901   TRIG 62.0 12/23/2013 0901   HDL 41.50 12/23/2013 0901   CHOLHDL 3 12/23/2013 0901   VLDL 12.4 12/23/2013 0901   LDLCALC 58 12/23/2013 0901    BMET    Component Value Date/Time   NA 143 12/05/2013 1654   K 4.0 12/05/2013 1654   CL 106 12/05/2013 1654   CO2 31 12/05/2013 1654   GLUCOSE 87 12/05/2013 1654   BUN 19 12/05/2013 1654   CREATININE 0.8 12/05/2013 1654   CREATININE 1.02 07/26/2013 1005   CALCIUM 8.8 12/05/2013 1654   GFRNONAA 68.66 01/01/2010 0811   GFRAA 121 10/21/2008 1006     ASSESSMENT AND PLAN  Mr. Scaife has developed shortness of breath on exertion and the concern is that  this may be a sign of progressive aortic stenosis. His last echocardiogram showed conflicting values for his aortic valve area (severe left ear) and a mean transaortic valve gradient (moderate left ear).  Have recommended that he undergo right and left heart catheterization. The risks and benefits of this procedure were discussed in detail he understands and agrees to proceed. A femoral approach will be used since he has had previous bypass.  If he indeed has symptomatic aortic stenosis with critical values by hemodynamic measurement, he is probably best suited for TAVR. He has had a previous sternotomy after failed attempts at robotic mitral valve surgery. Redo sternotomy would probably be high risk in this octogenarian.  Orders Placed This Encounter  Procedures  . CBC  . Comp Met (CMET)  . Protime-INR  . PTT  . Ambulatory referral to Cardiology  . LEFT AND RIGHT HEART CATHETERIZATION WITH CORONARY ANGIOGRAM   No orders of the defined types were placed in this encounter.    Holli Humbles, MD, [place] [phone] office [phone] pager

## 2014-01-08 NOTE — Op Note (Signed)
CARDIAC CATHETERIZATION REPORT   Procedures performed:  1. Left heart catheterization  2. Selective coronary angiography  3. Left ventriculography   Reason for procedure:  CAD status post CABG Congestive heart failure Valvular heart disease:   Procedure performed by: Sanda Klein, MD, [facility]  Complications: none   Estimated blood loss: less than 5 mL   History:  He is here to undergo radical left heart catheterization to clarify the severity of aortic stenosis and possible need for referral to TAVR.  Less than 2 months have passed since Mr. Offerdahl last appointment and he now has noticed progressively worsening exertional dyspnea. Previously asymptomatic, he now appears to have NYHA class II dyspnea on exertion. Denies chest pain or syncope with activity.  His cardiac problems date back to 2009 when he was diagnosed with severe mitral insufficiency related to mitral valve prolapse. He was also having problems with paroxysmal atrial fibrillation. Coronary angiography demonstrated moderate coronary artery disease primarily a 70% calcified eccentric proximal LAD lesion with moderate stenoses in the other 2 major coronary arteries. He was referred for robotic mitral valve repair by Dr. Amador Cunas at [facility]. The LIMA was found to be a very small vessel. He received a saphenous vein graft bypass to the LAD. Cryoablation was performed for atrial fibrillation. The mitral valve was replaced with a 29 mm [employer] Biocor prosthesis. The postoperative course was complicated by an embolic stroke from which she has recovered without sequelae. He received a dual-chamber permanent pacemaker (Medtronic adapta) and is pacemaker dependent with 100% pacing in both the atrium and the ventricle.  He has remained on amiodarone therapy with an extremely low burden of atrial fibrillation. As far as I can tell from the record the last documentation of atrial fibrillation was a one-hour episode that occurred in May of 2012.  He also remains on warfarin therapy.  Subsequently, he has developed progressive aortic valve disease and has at least moderate aortic stenosis with mild to moderate aortic insufficiency. There is some discrepancy between the estimated aortic valve area at 0.7 cm and the aortic gradients (mean 29 mm Hg). Biological mitral valve prosthesis shows mild stenosis with a mean gradient of 7 mm Hg (heart rate not specified). He has a severely dilated left atrium and mild tricuspid insufficiency.  Left ventricular systolic function is mildly depressed with an EF of 45-50% primarily due to RV pacing induced asynchrony.    Consent: The risks, benefits, and details of the procedure were explained to the patient. Risks including death, MI, stroke, bleeding, limb ischemia, renal failure and allergy were described and accepted by the patient. Informed written consent was obtained prior to proceeding.  Technique: The patient was brought to the cardiac catheterization laboratory in the fasting state. He was prepped and draped in the usual sterile fashion. Local anesthesia with 1% lidocaine was administered to the right groin area. Using the modified Seldinger technique a 5 French right common femoral artery sheath was introduced without difficulty. Under fluoroscopic guidance, using 5 [place] JL4, JR and angled pigtail catheters, selective cannulation of the left coronary artery, right coronary artery and left ventricle were respectively performed. Several coronary angiograms in a variety of projections were recorded, as well as a left ventriculogram in the RAO projection. Left ventricular pressure and a pull back to the aorta were recorded. No immediate complications occurred. At the end of the procedure, all catheters were removed. After the procedure, hemostasis will be achieved with manual pressure.  Contrast used: 150 mL Omnipaque  Hemodynamic findings:  Aortic pressure 130/70 (mean 96) mm Hg   Left ventricle  353/61 with end-diastolic pressure of 21 mm Hg  PA wedge pressure a wave 27, v wave 40 (mean 31) mm Hg  Pulmonary artery 56/33 (mean 43) mm Hg  Right ventricle 58/13 with an end-diastolic pressure of 19 mm Hg  Right atrium a wave 18, v wave 20 (mean 18) mm Hg  Cardiac output is 4.1 L per minute (cardiac index 2.1 L per minute per meter sq), with similar results by thermodilution and Fick methods  Aortic valve area by Gorlin equation 0.98 cm square (index 0.5 cm square/meter squared BSA) Aortic valve mean gradient 22 mm Hg Mitral valve area by Gorlin equation 1.2 cm square (0.58 cm square index for body surface area) Mitral valve mean gradient 13.7 mm Hg at a heart rate of 72 beats per minute - prosthetic valve  Angiographic Findings:  1. The left main coronary artery is free of significant atherosclerosis and bifurcates in the usual fashion into the left anterior descending artery and left circumflex coronary artery.  2. The left anterior descending artery is a large vessel that reaches the apex and generates twomajor diagonal branches. There is evidence of extensve luminal irregularities and severe calcification. There is a severe eccentric (probably >90%) ostial stenosis of the LAD artery with a large eccentric calcified plaque. No competitive flow is seen in the distal LAD artery, suggesting graft occlusion. 3. The left circumflex coronary artery is a large-size vessel non- dominant vessel that generates 2 major oblque marginal arteries. There is evidence of extensive luminal irregularities and heavy calcification. No hemodynamically meaningful stenoses are seen. 4. The right coronary artery is a large-size dominant vessel that generates a long posterior lateral ventricular system as well as the PDA. There is evidence of extensive luminal irregularities and the entire length of the vessel is heavily calcified.  A 60-70% stenosis is seen in the mid RCA, at the acute margin with an exophytic  heavily calcified plaque 5. The left ventricle was not injected There is moderate aortic valve stenosis by pullback. The left ventricular end-diastolic pressure is mildly elevated.  6. the ascending aorta is normal in caliber but is very heavily calcified. No evidence of previously placed saphenous vein graft filling the during ascending aortogram    IMPRESSIONS:  1. moderate aortic stenosis 2. moderate mitral stenosis (iatrogenic status post mitral valve replacement) 3. extensive coronary disease involving both native and graft vessels - interval occlusion of the saphenous vein graft bypass to the LAD artery with progression of the ostial LAD stenosis, which is now critical   RECOMMENDATION: Mr. Janik requires revascularization in the LAD artery territory. The stenosis is ostial and heavily calcified, therefore challenging for treatment with angioplasty and stent. He also has moderate aortic valve stenosis and will require treatment for this in the future. This would make him a candidate for surgical treatment. Unfortunately the presence of heavy calcification of the ascending aorta, advanced age and history of prior sternotomy placement of a high risk for surgical complications.  The images were reviewed with Dr. Burt Knack and various options for treatment were discussed. The patient will followup with Dr. Burt Knack in the office next week to discuss a high risk percutaneous intervention to the proximal LAD artery. Ideally he'll receive a drug-eluting stent. Consideration will be given to aortic valve replacement with a stent graft after 6-12 months of dual antiplatelet therapy.

## 2014-01-08 NOTE — Progress Notes (Signed)
Received pt from cath procedure alert and denies any discomfort at this time. 

## 2014-01-08 NOTE — Discharge Instructions (Signed)
Angiography, Care After Refer to this sheet in the next few weeks. These instructions provide you with information on caring for yourself after your procedure. Your health care provider may also give you more specific instructions. Your treatment has been planned according to current medical practices, but problems sometimes occur. Call your health care provider if you have any problems or questions after your procedure.  WHAT TO EXPECT AFTER THE PROCEDURE After your procedure, it is typical to have the following sensations:  Minor discomfort or tenderness and a small bump at the catheter insertion site. The bump should usually decrease in size and tenderness within 1 to 2 weeks.  Any bruising will usually fade within 2 to 4 weeks. HOME CARE INSTRUCTIONS   You may need to keep taking blood thinners if they were prescribed for you. Only take over-the-counter or prescription medicines for pain, fever, or discomfort as directed by your health care provider.  Do not apply powder or lotion to the site.  Do not sit in a bathtub, swimming pool, or whirlpool for 5 to 7 days.  You may shower 24 hours after the procedure. Remove the bandage (dressing) and gently wash the site with plain soap and water. Gently pat the site dry.  Inspect the site at least twice daily.  Limit your activity for the first 48 hours. Do not bend, squat, or lift anything over 20 lb (9 kg) or as directed by your health care provider.  Do not drive home if you are discharged the day of the procedure. Have someone else drive you. Follow instructions about when you can drive or return to work. SEEK MEDICAL CARE IF:  You get lightheaded when standing up.  You have drainage (other than a small amount of blood on the dressing).  You have chills.  You have a fever.  You have redness, warmth, swelling, or pain at the insertion site. SEEK IMMEDIATE MEDICAL CARE IF:   You develop chest pain or shortness of breath, feel faint,  or pass out.  You have bleeding, swelling larger than a walnut, or drainage from the catheter insertion site.  You develop pain, discoloration, coldness, or severe bruising in the leg or arm that held the catheter.  You develop bleeding from any other place, such as the bowels. You may see bright red blood in your urine or stools, or your stools may appear black and tarry.  You have heavy bleeding from the site. If this happens, hold pressure on the site. MAKE SURE YOU:  Understand these instructions.  Will watch your condition.  Will get help right away if you are not doing well or get worse.  Call if bleeding is not under control Document Released: 05/12/2005 Document Revised: 06/26/2013 Document Reviewed: 03/18/2013 [facility] Patient Information 2014 [place].

## 2014-01-08 NOTE — Progress Notes (Signed)
Discharge instructions given per MD order.  Pt and CG able to verbalize understanding.  Pt to car via wheelchair. 

## 2014-01-14 ENCOUNTER — Ambulatory Visit: Payer: Medicare Other | Admitting: Cardiovascular Disease

## 2014-01-14 ENCOUNTER — Telehealth: Payer: Self-pay | Admitting: Cardiovascular Disease

## 2014-01-14 NOTE — Telephone Encounter (Signed)
Walk In pt Form " Pt Requesting Earlier Appt" gave to Message Nurse

## 2014-01-15 ENCOUNTER — Telehealth: Payer: Self-pay | Admitting: Cardiovascular Disease

## 2014-01-15 ENCOUNTER — Telehealth: Payer: Self-pay | Admitting: Nurse Practitioner

## 2014-01-15 NOTE — Telephone Encounter (Signed)
Sending to [facility], Dr. Antionette Char scheduler to schedule ov for tomorrow, March 12.

## 2014-01-15 NOTE — Telephone Encounter (Signed)
New message ° ° ° ° ° ° ° ° ° °Pt returning nurses call °

## 2014-01-15 NOTE — Telephone Encounter (Signed)
Left message for patient to schedule him for tomorrow 3/12 due to a cancellation.  Advised patient of the time and to call at 0800 tomorrow to confirm.

## 2014-01-16 ENCOUNTER — Ambulatory Visit (INDEPENDENT_AMBULATORY_CARE_PROVIDER_SITE_OTHER): Payer: Medicare Other | Admitting: Cardiovascular Disease

## 2014-01-16 ENCOUNTER — Encounter: Payer: Self-pay | Admitting: Nurse Practitioner

## 2014-01-16 ENCOUNTER — Encounter: Payer: Self-pay | Admitting: Cardiovascular Disease

## 2014-01-16 VITALS — BP 126/81 | HR 77 | Ht 70.0 in | Wt 178.0 lb

## 2014-01-16 DIAGNOSIS — I359 Nonrheumatic aortic valve disorder, unspecified: Secondary | ICD-10-CM | POA: Diagnosis not present

## 2014-01-16 DIAGNOSIS — I050 Rheumatic mitral stenosis: Secondary | ICD-10-CM | POA: Diagnosis not present

## 2014-01-16 DIAGNOSIS — R0602 Shortness of breath: Secondary | ICD-10-CM

## 2014-01-16 DIAGNOSIS — I350 Nonrheumatic aortic (valve) stenosis: Secondary | ICD-10-CM

## 2014-01-16 LAB — CBC WITH DIFFERENTIAL/PLATELET
Basophils Absolute: 0 10*3/uL (ref 0.0–0.1)
Basophils Relative: 0.5 % (ref 0.0–3.0)
EOS PCT: 3.6 % (ref 0.0–5.0)
Eosinophils Absolute: 0.2 10*3/uL (ref 0.0–0.7)
HEMATOCRIT: 36.7 % — AB (ref 39.0–52.0)
HEMOGLOBIN: 12.2 g/dL — AB (ref 13.0–17.0)
LYMPHS PCT: 22 % (ref 12.0–46.0)
Lymphs Abs: 1.1 10*3/uL (ref 0.7–4.0)
MCHC: 33.3 g/dL (ref 30.0–36.0)
MCV: 93.5 fl (ref 78.0–100.0)
MONOS PCT: 10.5 % (ref 3.0–12.0)
Monocytes Absolute: 0.5 10*3/uL (ref 0.1–1.0)
Neutro Abs: 3.2 10*3/uL (ref 1.4–7.7)
Neutrophils Relative %: 63.4 % (ref 43.0–77.0)
PLATELETS: 161 10*3/uL (ref 150.0–400.0)
RBC: 3.92 Mil/uL — ABNORMAL LOW (ref 4.22–5.81)
RDW: 15.6 % — AB (ref 11.5–14.6)
WBC: 5 10*3/uL (ref 4.5–10.5)

## 2014-01-16 LAB — BASIC METABOLIC PANEL
BUN: 23 mg/dL (ref 6–23)
CALCIUM: 9 mg/dL (ref 8.4–10.5)
CO2: 29 mEq/L (ref 19–32)
Chloride: 108 mEq/L (ref 96–112)
Creatinine, Ser: 0.9 mg/dL (ref 0.4–1.5)
GFR: 90.3 mL/min (ref >60.00)
GLUCOSE: 83 mg/dL (ref 70–99)
Potassium: 4.5 mEq/L (ref 3.5–5.1)
Sodium: 143 mEq/L (ref 135–145)

## 2014-01-16 LAB — PROTIME-INR
INR: 1.4 ratio — ABNORMAL HIGH (ref 0.8–1.0)
Prothrombin Time: 15 s — ABNORMAL HIGH (ref 10.2–12.4)

## 2014-01-16 NOTE — Telephone Encounter (Signed)
New problem    Please call pt

## 2014-01-16 NOTE — Patient Instructions (Addendum)
Your physician recommends that you have lab work today:  Basic metabolic panel, CBC, PT/INR  Your physician has requested that you have a TEE. During a TEE, sound waves are used to create images of your heart. It provides your doctor with information about the size and shape of your heart and how well your heart's chambers and valves are working. In this test, a transducer is attached to the end of a flexible tube that's guided down your throat and into your esophagus (the tube leading from you mouth to your stomach) to get a more detailed image of your heart. You are not awake for the procedure. Please see the instruction sheet given to you today. For further information please visit [email or web address].  Non-Cardiac CT scanning, (CAT scanning), is a noninvasive, special x-ray that produces cross-sectional images of the body using x-rays and a computer. CT scans help physicians diagnose and treat medical conditions. For some CT exams, a contrast material is used to enhance visibility in the area of the body being studied. CT scans provide greater clarity and reveal more details than regular x-ray exams.  Your physician recommends that you continue on your current medications as directed. Please refer to the Current Medication list given to you today.  Your physician recommends that you schedule a follow-up appointment in:  With Dr. Burt Knack after your CT Scan and TEE

## 2014-01-16 NOTE — Telephone Encounter (Signed)
Follow up         I talked with pt and he is coming this morning

## 2014-01-17 ENCOUNTER — Ambulatory Visit (INDEPENDENT_AMBULATORY_CARE_PROVIDER_SITE_OTHER): Payer: Medicare Other | Admitting: Pharmacist Clinician (PhC)/ Clinical Pharmacy Specialist

## 2014-01-17 ENCOUNTER — Ambulatory Visit (INDEPENDENT_AMBULATORY_CARE_PROVIDER_SITE_OTHER)
Admission: RE | Admit: 2014-01-17 | Discharge: 2014-01-17 | Disposition: A | Discharge status: Home / Self Care | Payer: Medicare Other | Source: Ambulatory Visit | Attending: Cardiovascular Disease | Admitting: Cardiovascular Disease

## 2014-01-17 DIAGNOSIS — I050 Rheumatic mitral stenosis: Secondary | ICD-10-CM

## 2014-01-17 DIAGNOSIS — I4891 Unspecified atrial fibrillation: Secondary | ICD-10-CM

## 2014-01-17 DIAGNOSIS — I359 Nonrheumatic aortic valve disorder, unspecified: Secondary | ICD-10-CM

## 2014-01-17 DIAGNOSIS — Z7901 Long term (current) use of anticoagulants: Secondary | ICD-10-CM

## 2014-01-17 DIAGNOSIS — I350 Nonrheumatic aortic (valve) stenosis: Secondary | ICD-10-CM

## 2014-01-17 DIAGNOSIS — Z8679 Personal history of other diseases of the circulatory system: Secondary | ICD-10-CM

## 2014-01-17 DIAGNOSIS — R0602 Shortness of breath: Secondary | ICD-10-CM

## 2014-01-17 LAB — POCT INR: INR: 1.3

## 2014-01-17 MED ORDER — IOHEXOL 350 MG/ML SOLN
80.0000 mL | Freq: Once | INTRAVENOUS | Status: AC | PRN
  Administered 2014-01-17: 80 mL via INTRAVENOUS

## 2014-01-19 ENCOUNTER — Other Ambulatory Visit: Payer: Self-pay | Admitting: Cardiovascular Disease

## 2014-01-19 DIAGNOSIS — I350 Nonrheumatic aortic (valve) stenosis: Secondary | ICD-10-CM

## 2014-01-20 ENCOUNTER — Other Ambulatory Visit: Payer: Self-pay | Admitting: Pharmacist Clinician (PhC)/ Clinical Pharmacy Specialist

## 2014-01-20 ENCOUNTER — Encounter: Payer: Self-pay | Admitting: Cardiovascular Disease

## 2014-01-20 DIAGNOSIS — Z7901 Long term (current) use of anticoagulants: Secondary | ICD-10-CM

## 2014-01-20 DIAGNOSIS — Z8679 Personal history of other diseases of the circulatory system: Secondary | ICD-10-CM

## 2014-01-20 DIAGNOSIS — I4891 Unspecified atrial fibrillation: Secondary | ICD-10-CM

## 2014-01-20 NOTE — Telephone Encounter (Signed)
Left detailed message on patient's voice mail regarding normal chest CT and advised patient to call office with additional questions or concerns.

## 2014-01-20 NOTE — Progress Notes (Signed)
HPI:   80 year-old gentleman referred by Dr [name] for interventional evaluation of symptomatic CAD and aortic stenosis. The patient has a complex history dating back to 2009 when he was diagnosed with MVP and associated severe MR as well as atrial fibrillation. He was noted to have 70% calcified stenosis at the LAD ostium and moderate disease in his other coronary arteries. He underwent robotic mitral valve repair at [facility] with grafting of the LAD using a SVG conduit because the LIMA was inadequate. He underwent Cryoablation and ultimately required mitral valve replacement with a 29 mm St Jude Biocor prosthesis. His post-op course was complicated by an embolic stroke from which he has had a full recovery. He has undergone placement of a permanent pacemaker and is pacer-dependent. He's maintained on amiodarone for PAF with a very low AF burden and he's anticoagulated with warfarin. LV function is mildly depressed at 45-50%.   The patient had done quite well since his valve surgery in 2009, until he began to experience breathlessness over the past 2-3 months. He has been independent and fairly active, still teaching art. Over the past several months, he has developed shortness of breath with walking on level ground (less than one block), and with conversation. He denies cough, chest pain, edema, lightheadedness, or syncope.   The patient is married. He has 4 children, 14 grandchildren, and 24 great grandchildren.   Outpatient Encounter Prescriptions as of 01/16/2014  Medication Sig  . amiodarone (PACERONE) 200 MG tablet Take 1 tablet (200 mg total) by mouth daily.  Marland Kitchen aspirin EC 81 MG tablet Take 81 mg by mouth daily.  . ferrous sulfate 325 (65 FE) MG tablet Take 325 mg by mouth daily with breakfast.  . methimazole (TAPAZOLE) 5 MG tablet Take 1 tablet (5 mg total) by mouth 3 (three) times a week. On Monday, Wednesday and Friday  . metoprolol succinate (TOPROL-XL) 100 MG 24 hr tablet Take 1 tablet (100  mg total) by mouth daily. Take with or immediately following a meal.  . Multiple Vitamin (MULTIVITAMIN WITH MINERALS) TABS Take 1 tablet by mouth daily. Centrum Silver  . pantoprazole (PROTONIX) 40 MG tablet Take 1 tablet (40 mg total) by mouth 2 (two) times daily.  . simvastatin (ZOCOR) 40 MG tablet Take 1 tablet (40 mg total) by mouth at bedtime.  . Tamsulosin HCl (FLOMAX) 0.4 MG CAPS Take 0.4 mg by mouth every evening.   . warfarin (COUMADIN) 3 MG tablet Take 1.5-3 mg by mouth daily. Take 1/2 tablet (1.5mg ) on Monday, Wednesday, and Friday. All other days take a whole tablet of 3mg     Review of patient's allergies indicates no known allergies.  Past Medical History  Diagnosis Date  . GASTRIC POLYP 05/13/2009  . COLONIC POLYPS, ADENOMATOUS 01/14/2008  . HYPERLIPIDEMIA 06/01/2007  . ANEMIA, IRON DEFICIENCY 10/13/2008  . COAGULOPATHY, COUMADIN-INDUCED 01/14/2008  . ANXIETY 09/04/2008  . HYPERTENSION 06/01/2007  . ATRIAL FIBRILLATION, CHRONIC 01/14/2008    on warfarin since 2007  . UNSPECIFIED VENOUS INSUFFICIENCY 09/18/2008  . PLEURAL EFFUSION, LEFT 10/13/2008  . GERD 06/01/2007  . BARRETTS ESOPHAGUS 03/20/2009  . PROSTATE CANCER, HX OF 01/14/2008  . CATARACTS, BILATERAL, HX OF 01/14/2008  . CEREBROVASCULAR ACCIDENT, HX OF 08/18/2008  . PERSONAL HX COLONIC POLYPS 01/01/2010  . BASAL CELL CARCINOMA OF SKIN SITE UNSPECIFIED 09/28/2010  . HYPERTHYROIDISM 01/06/2011  . Hyperglycemia   . Elevated PSA   . Restless leg syndrome   . Mitral regurgitation 06/13/2008    mitral valve  replacement #20 St. Jude Biocor; Maze procedure by Dr. Amador Cunas  at [facility]  . CORONARY ARTERY DISEASE 06/01/2007    Cath 04/03/2008; Cath 11/01/1990  . Tachy-brady syndrome 07/13/2008    medtronic Adapta gen. model ADDR01 ,serial # [id number] H by Dr  [name] at [facility]  . Peripheral artery disease 06/16/2010    LEA doppler-left ABI nml 0.61 calcified vessels;right ABI  0.68  . Edema, peripheral 07/30/2009    LEV doppler- no  thrombus or thrombophlebitis  . Aortic valvular disorder 08/16/2012    echo EF 45-50% LV mildly reduce,Biocor mitral valve,mild to mod. tricuspid regrug.,mild to mod. aortic regrug.  . Mitral valve disorder 09/08/2011    echo EF 50-55% LV normal  . Aortic valvular stenosis 11/19/2013  . Cardiomyopathy, ischemic 11/19/2013    EF 45-50% with inferior wall hypokinesis by echo 2014    Past Surgical History  Procedure Laterality Date  . Prostatectomy    . Tonsillectomy    . Cystectomy      Left leg  . Pacemaker placement  07/13/2008    [place] dual chamber-Medtronic at [facility]   . Coronary artery bypass graft  06/13/2008    graft x1 w/vein graft LAD artery by Dr. Amador Cunas at [facility]  . Mitral valve replacement  06/13/2008    #20 St. Jude Bicor mitral valve ;Maze procedure at [facility] by Dr. Amador Cunas  . Cardiac catheterization  11/01/1990  . Skin cancer excision      removed from forehead and right leg, nose/face  . Upper gastrointestinal endoscopy  07/14/2009    erosive reflux esopagitis, Barrett's esophagus  . Colonoscopy  07/14/2009    diverticulosis, internal hemorrhoids  . Cardiovascular stress test  06/03/2010    Persantine perfusion EF 45% mild to moderate ischemia basal and mid inferolateral region  . Cardiovascular stress test  01/01/2008    left ventricle normal,no significant ischemia  . Cardiovascular stress test  09/09/2005     possibility of ischemia inferior wall    History   Social History  . Marital Status: Married    Spouse Name: N/A    Number of Children: 4  . Years of Education: N/A   Occupational History  . Retired    Social History Main Topics  . Smoking status: Former Smoker -- 2 years    Types: Pipe    Quit date: 11/07/1965  . Smokeless tobacco: Never Used     Comment: quit over 40 years ago, never smoked cigarettes  . Alcohol Use: No  . Drug Use: No  . Sexual Activity: Not on file   Other Topics Concern  . Not on file   Social History Narrative  . No  narrative on file    Family History  Problem Relation Age of Onset  . Cancer Mother     Breast Cancer    ROS:  General: no fevers/chills/night sweats Eyes: no blurry vision, diplopia, or amaurosis ENT: no sore throat or hearing loss Resp: no cough, wheezing, or hemoptysis CV: no edema or palpitations GI: no abdominal pain, nausea, vomiting, diarrhea, or constipation GU: no dysuria, frequency, or hematuria Skin: no rash Neuro: no headache, numbness, tingling, or weakness of extremities Musculoskeletal: no joint pain or swelling Heme: no bleeding, DVT, or easy bruising Endo: no polydipsia or polyuria  BP 126/81  Pulse 77  Ht 5\' 10"  (1.778 m)  Wt 178 lb (80.74 kg)  BMI 25.54 kg/m2  PHYSICAL EXAM: Pt is alert and oriented, WD, WN, pleasant elderly male in  no distress. HEENT: normal Neck: JVP normal. Carotid upstrokes normal. No thyromegaly. Lungs: equal expansion, clear bilaterally CV: Apex is discrete and nondisplaced, RRR with grade 3/6 harsh systolic murmur at the LSB Abd: soft, NT, +BS, no bruit, no hepatosplenomegaly Back: no CVA tenderness Ext: no C/C/E        DP/PT pulses intact and = Skin: warm and dry without rash Neuro: CNII-XII intact             Strength intact = bilaterally  2D Echo 08/20/2013: Study Conclusions  - Left ventricle: The cavity size was normal. There was severe concentric hypertrophy. Systolic function was mildly reduced. The estimated ejection fraction was in the range of 45% to 50%. Incoordinate septal motion. Basal to mid inferior akinesis. The study is not technically sufficient to allow evaluation of LV diastolic function. The E/e' ratio is >25, suggesting extremely high LV filling pressure. - Aortic valve: Heavily calcified aortic valve with restricted leaflet motion. Peak and mean gradients of 46 and 29 mmHg, respectively. Based on an LVOT diameter of 2.1cm, the calculated AVA is 0.7-0.8 cm2. There is discrepancy between the  calculated valve area and gradient. Dobutamine stress echo may be helpful in an asymptomatic patient to see if a provokable gradient is present, given his reduced EF. Mild regurgitation. Valve area: 0.75cm^2(VTI). Valve area: 0.84cm^2 (Vmax). - Mitral valve: Porcine mitral valve bioprosthetic. Well-seated. There is mild mitral stenosis. The peak and mean gradients are 20 mmHg and 7 mmHG, respectively. The P1/2 T is 109 ms. Indexed valve area by pressure half-time: 1.05cm^2/m^2. Valve area by continuity equation (using LVOT flow): 1.24cm^2. - Left atrium: Severely dilated - 75 ml/m2. LA Volume/ BSA = 75.5 ml/m2 - Right ventricle: The cavity size was mildly dilated. Wall thickness was normal. Pacer wire or catheter noted in right ventricle. RV systolic pressure: 0000000 Hg (S, est). - Right atrium: Moderately dilated (21.6 cm2). Pacer wire or catheter noted in right atrium. - Tricuspid valve: Moderate regurgitation. - Pulmonary arteries: PA peak pressure: 48mm Hg (S). - Inferior vena cava: The vessel was dilated; the respirophasic diameter changes were blunted (< 50%); findings are consistent with elevated central venous pressure.  Cardiac cath 01/08/2014: Contrast used: 150 mL Omnipaque  Hemodynamic findings:  Aortic pressure 130/70 (mean 96) mm Hg  Left ventricle [id number] with end-diastolic pressure of 21 mm Hg  PA wedge pressure a wave 27, v wave 40 (mean 31) mm Hg  Pulmonary artery 56/33 (mean 43) mm Hg  Right ventricle 58/13 with an end-diastolic pressure of 19 mm Hg  Right atrium a wave 18, v wave 20 (mean 18) mm Hg  Cardiac output is 4.1 L per minute (cardiac index 2.1 L per minute per meter sq), with similar results by thermodilution and Fick methods  Aortic valve area by Gorlin equation 0.98 cm square (index 0.5 cm square/meter squared BSA)  Aortic valve mean gradient 22 mm Hg  Mitral valve area by Gorlin equation 1.2 cm square (0.58 cm square index for body surface area)    Mitral valve mean gradient 13.7 mm Hg at a heart rate of 72 beats per minute - prosthetic valve  Angiographic Findings:  1. The left main coronary artery is free of significant atherosclerosis and bifurcates in the usual fashion into the left anterior descending artery and left circumflex coronary artery.  2. The left anterior descending artery is a large vessel that reaches the apex and generates twomajor diagonal branches. There is evidence of extensve luminal irregularities and severe  calcification. There is a severe eccentric (probably >90%) ostial stenosis of the LAD artery with a large eccentric calcified plaque.  No competitive flow is seen in the distal LAD artery, suggesting graft occlusion.  3. The left circumflex coronary artery is a large-size vessel non- dominant vessel that generates 2 major oblque marginal arteries. There is evidence of extensive luminal irregularities and heavy calcification. No hemodynamically meaningful stenoses are seen.  4. The right coronary artery is a large-size dominant vessel that generates a long posterior lateral ventricular system as well as the PDA. There is evidence of extensive luminal irregularities and the entire length of the vessel is heavily calcified. A 60-70% stenosis is seen in the mid RCA, at the acute margin with an exophytic heavily calcified plaque  5. The left ventricle was not injected There is moderate aortic valve stenosis by pullback. The left ventricular end-diastolic pressure is mildly elevated.  6. the ascending aorta is normal in caliber but is very heavily calcified. No evidence of previously placed saphenous vein graft filling the during ascending aortogram   IMPRESSIONS:  1. moderate aortic stenosis  2. moderate mitral stenosis (iatrogenic status post mitral valve replacement)  3. extensive coronary disease involving both native and graft vessels - interval occlusion of the saphenous vein graft bypass to the LAD artery with  progression of the ostial LAD stenosis, which is now critical  RECOMMENDATION:  Mr. Rumple requires revascularization in the LAD artery territory. The stenosis is ostial and heavily calcified, therefore challenging for treatment with angioplasty and stent. He also has moderate aortic valve stenosis and will require treatment for this in the future. This would make him a candidate for surgical treatment.  Unfortunately the presence of heavy calcification of the ascending aorta, advanced age and history of prior sternotomy placement of a high risk for surgical complications.  The images were reviewed with Dr. Burt Knack and various options for treatment were discussed. The patient will followup with Dr. Burt Knack in the office next week to discuss a high risk percutaneous intervention to the proximal LAD artery. Ideally he'll receive a drug-eluting stent. Consideration will be given to aortic valve replacement with a stent graft after 6-12 months of dual antiplatelet therapy.      ASSESSMENT AND PLAN: 80 year-old male with a complex cardiac history outlined above, presenting for evaluation of progressive dyspnea, now NYHA Class 3. Significant cardiac problems include moderate-severe aortic stenosis, obstructive CAD primarily involving the LAD ostium but also in the mid-RCA, and possible prosthetic mitral stenosis. It is also notable that he is on chronic amiodarone for paroxysmal atrial fibrillation.   I have reviewed his cardiac cath and echo films. Notable findings include a severely calcified and restricted aortic valve. Despite hemodynamic findings suggestive of moderate AS, the appearance of the valve with limited mobility of all 3 leaflets raises suspicion for more severe stenosis. On plain fluoroscopy, the patient has very severe calcification of the ascending aorta. Considering his previous perioperative stroke with cardiac surgery and severely calcified ascending aorta, I do not think he would be a  candidate for redo CABG/AVR.  Recommend the following:  Check CTA Chest to evaluate lung parenchyma and evaluate the possibility of amiodarone-related lung disease (doubt). Also will further evaluate the ascending aorta.   Check TEE to evaluate aortic valve morphology and hemodynamics further. Also evaluate mitral valve prosthesis  Obtain op report from 2009 cardiac surgery  Follow-up after above studies completed.   Considerations will be high-risk PCI of the LAD ostium +/-  RCA with adjunctive atherectomy to treat heavy calcification and allow stent expansion. Also will need to consider multidisciplinary approach with cardiac surgery if his valvular disease is found to be severe.   I appreciate the opportunity to see this nice gentleman and will arrange follow-up after his studies are completed.   [name] 01/20/2014 11:06 PM

## 2014-01-20 NOTE — Telephone Encounter (Signed)
Follow up     Returning a nurses call.  Having TEE soon

## 2014-01-22 ENCOUNTER — Encounter (HOSPITAL_COMMUNITY)
Admission: RE | Disposition: A | Discharge status: Home / Self Care | Payer: Self-pay | Source: Ambulatory Visit | Attending: Cardiovascular Disease

## 2014-01-22 ENCOUNTER — Ambulatory Visit (HOSPITAL_COMMUNITY)
Admission: RE | Admit: 2014-01-22 | Discharge: 2014-01-22 | Disposition: A | Discharge status: Home / Self Care | Payer: Medicare Other | Source: Ambulatory Visit | Attending: Cardiovascular Disease | Admitting: Cardiovascular Disease

## 2014-01-22 DIAGNOSIS — Z954 Presence of other heart-valve replacement: Secondary | ICD-10-CM | POA: Insufficient documentation

## 2014-01-22 DIAGNOSIS — I359 Nonrheumatic aortic valve disorder, unspecified: Secondary | ICD-10-CM

## 2014-01-22 DIAGNOSIS — I350 Nonrheumatic aortic (valve) stenosis: Secondary | ICD-10-CM

## 2014-01-22 DIAGNOSIS — Z950 Presence of cardiac pacemaker: Secondary | ICD-10-CM | POA: Insufficient documentation

## 2014-01-22 DIAGNOSIS — I4891 Unspecified atrial fibrillation: Secondary | ICD-10-CM | POA: Insufficient documentation

## 2014-01-22 DIAGNOSIS — I739 Peripheral vascular disease, unspecified: Secondary | ICD-10-CM | POA: Insufficient documentation

## 2014-01-22 DIAGNOSIS — Z7982 Long term (current) use of aspirin: Secondary | ICD-10-CM | POA: Insufficient documentation

## 2014-01-22 DIAGNOSIS — F411 Generalized anxiety disorder: Secondary | ICD-10-CM | POA: Insufficient documentation

## 2014-01-22 DIAGNOSIS — Z8673 Personal history of transient ischemic attack (TIA), and cerebral infarction without residual deficits: Secondary | ICD-10-CM | POA: Insufficient documentation

## 2014-01-22 DIAGNOSIS — I080 Rheumatic disorders of both mitral and aortic valves: Secondary | ICD-10-CM | POA: Insufficient documentation

## 2014-01-22 DIAGNOSIS — I495 Sick sinus syndrome: Secondary | ICD-10-CM | POA: Insufficient documentation

## 2014-01-22 DIAGNOSIS — I10 Essential (primary) hypertension: Secondary | ICD-10-CM | POA: Insufficient documentation

## 2014-01-22 DIAGNOSIS — K219 Gastro-esophageal reflux disease without esophagitis: Secondary | ICD-10-CM | POA: Insufficient documentation

## 2014-01-22 DIAGNOSIS — Z8546 Personal history of malignant neoplasm of prostate: Secondary | ICD-10-CM | POA: Insufficient documentation

## 2014-01-22 DIAGNOSIS — K2270 Barrett's esophagus without dysplasia: Secondary | ICD-10-CM | POA: Insufficient documentation

## 2014-01-22 DIAGNOSIS — Z7901 Long term (current) use of anticoagulants: Secondary | ICD-10-CM | POA: Insufficient documentation

## 2014-01-22 DIAGNOSIS — I25810 Atherosclerosis of coronary artery bypass graft(s) without angina pectoris: Secondary | ICD-10-CM | POA: Insufficient documentation

## 2014-01-22 DIAGNOSIS — I2510 Atherosclerotic heart disease of native coronary artery without angina pectoris: Secondary | ICD-10-CM | POA: Insufficient documentation

## 2014-01-22 DIAGNOSIS — I2589 Other forms of chronic ischemic heart disease: Secondary | ICD-10-CM | POA: Insufficient documentation

## 2014-01-22 DIAGNOSIS — Z85828 Personal history of other malignant neoplasm of skin: Secondary | ICD-10-CM | POA: Insufficient documentation

## 2014-01-22 DIAGNOSIS — Z87891 Personal history of nicotine dependence: Secondary | ICD-10-CM | POA: Insufficient documentation

## 2014-01-22 DIAGNOSIS — G2581 Restless legs syndrome: Secondary | ICD-10-CM | POA: Insufficient documentation

## 2014-01-22 DIAGNOSIS — E785 Hyperlipidemia, unspecified: Secondary | ICD-10-CM | POA: Insufficient documentation

## 2014-01-22 HISTORY — PX: TEE WITHOUT CARDIOVERSION: SHX5443

## 2014-01-22 SURGERY — ECHOCARDIOGRAM, TRANSESOPHAGEAL
Anesthesia: Moderate Sedation

## 2014-01-22 MED ORDER — MIDAZOLAM HCL 10 MG/2ML IJ SOLN
INTRAMUSCULAR | Status: DC | PRN
  Administered 2014-01-22: 2 mg via INTRAVENOUS
  Administered 2014-01-22: 1 mg via INTRAVENOUS

## 2014-01-22 MED ORDER — SODIUM CHLORIDE 0.9 % IV SOLN
INTRAVENOUS | Status: DC
  Administered 2014-01-22: 14:00:00 via INTRAVENOUS

## 2014-01-22 MED ORDER — DIPHENHYDRAMINE HCL 50 MG/ML IJ SOLN
INTRAMUSCULAR | Status: AC
  Filled 2014-01-22: qty 1

## 2014-01-22 MED ORDER — BUTAMBEN-TETRACAINE-BENZOCAINE 2-2-14 % EX AERO
INHALATION_SPRAY | CUTANEOUS | Status: DC | PRN
  Administered 2014-01-22: 2 via TOPICAL

## 2014-01-22 MED ORDER — FENTANYL CITRATE 0.05 MG/ML IJ SOLN
INTRAMUSCULAR | Status: AC
  Filled 2014-01-22: qty 2

## 2014-01-22 MED ORDER — MIDAZOLAM HCL 5 MG/ML IJ SOLN
INTRAMUSCULAR | Status: AC
  Filled 2014-01-22: qty 2

## 2014-01-22 MED ORDER — FENTANYL CITRATE 0.05 MG/ML IJ SOLN
INTRAMUSCULAR | Status: DC | PRN
  Administered 2014-01-22: 25 ug via INTRAVENOUS

## 2014-01-22 NOTE — Interval H&P Note (Signed)
History and Physical Interval Note:  01/22/2014 12:54 PM  [name] [name]  has presented today for surgery, with the diagnosis of shortness of breath  The various methods of treatment have been discussed with the patient and family. After consideration of risks, benefits and other options for treatment, the patient has consented to  Procedure(s): TRANSESOPHAGEAL ECHOCARDIOGRAM (TEE) (N/A) as a surgical intervention .  The patient's history has been reviewed, patient examined, no change in status, stable for surgery.  I have reviewed the patient's chart and labs.  Questions were answered to the patient's satisfaction.     Seven Dollens

## 2014-01-22 NOTE — Progress Notes (Signed)
  Echocardiogram Echocardiogram Transesophageal has been performed.  Philipp Deputy 01/22/2014, 3:25 PM

## 2014-01-22 NOTE — Discharge Instructions (Signed)
Hiatal Hernia A hiatal hernia occurs when a part of the stomach slides above the diaphragm. The diaphragm is the thin muscle separating the belly (abdomen) from the chest. A hiatal hernia can be something you are born with or develop over time. Hiatal hernias may allow stomach acid to flow back into your esophagus, the tube which carries food from your mouth to your stomach. If this acid causes problems it is called GERD (gastro-esophageal reflux disease).  SYMPTOMS  Common symptoms of GERD are heartburn (burning in your chest). This is worse when lying down or bending over. It may also cause belching and indigestion. Some of the things which make GERD worse are:  Increased weight pushes on stomach making acid rise more easily.  Smoking markedly increases acid production.  Alcohol decreases lower esophageal sphincter pressure (valve between stomach and esophagus), allowing acid from stomach into esophagus.  Late evening meals and going to bed with a full stomach increases pressure.  Anything that causes an increase in acid production.  Lower esophageal sphincter incompetence. DIAGNOSIS  Hiatal hernia is often diagnosed with x-rays of your stomach and small bowel. This is called an UGI (upper gastrointestinal x-ray). Sometimes a gastroscopic procedure is done. This is a procedure where your caregiver uses a flexible instrument to look into the stomach and small bowel. HOME CARE INSTRUCTIONS   Try to achieve and maintain an ideal body weight.  Avoid drinking alcoholic beverages.  Stop smoking.  Put the head of your bed on 4 to 6 inch blocks. This will keep your head and esophagus higher than your stomach. If you cannot use blocks, sleep with several pillows under your head and shoulders.  Over-the-counter medications will decrease acid production. Your caregiver can also prescribe medications for this. Take as directed.  1/2 to 1 teaspoon of an antacid taken every hour while awake, with  meals and at bedtime, will neutralize acid.  Do not take aspirin, ibuprofen (Advil or Motrin), or other nonsteroidal anti-inflammatory drugs.  Do not wear tight clothing around your chest or stomach.  Eat smaller meals and eat more frequently. This keeps your stomach from getting too full. Eat slowly.  Do not lie down for 2 or 3 hours after eating. Do not eat or drink anything 1 to 2 hours before going to bed.  Avoid caffeine beverages (colas, coffee, cocoa, tea), fatty foods, citrus fruits and all other foods and drinks that contain acid and that seem to increase the problems.  Avoid bending over, especially after eating. Also avoid straining during bowel movements or when urinating or lifting things. Anything that increases the pressure in your belly increases the amount of acid that may be pushed up into your esophagus. SEEK IMMEDIATE MEDICAL CARE IF:  There is change in location (pain in arms, neck, jaw, teeth or back) of your pain, or the pain is getting worse.  You also experience nausea, vomiting, sweating (diaphoresis), or shortness of breath.  You develop continual vomiting, vomit blood or coffee ground material, have bright red blood in your stools, or have black tarry stools. Some of these symptoms could signal other problems such as heart disease. MAKE SURE YOU:   Understand these instructions.  Monitor your condition.  Contact your caregiver if you are not doing well or are getting worse. Document Released: 01/14/2004 Document Revised: 01/16/2012 Document Reviewed: 10/24/2005 [facility] Patient Information 2014 [place], [place]. Transesophageal Echocardiography Transesophageal echocardiography (TEE) is a special type of test that produces images of the heart by using sound  waves (echocardiogram). This type of echocardiography can obtain better images of the heart than standard echocardiography. TEE is done by passing a flexible tube down the esophagus. The heart is located  in front of the esophagus. Because the heart and esophagus are close to one another, your health care provider can take very clear, detailed pictures of the heart via ultrasound waves. TEE may be done:  If your health care provider needs more information based on standard echocardiography findings.  If you had a stroke. This might have happened because a clot formed in your heart. TEE can visualize different areas of the heart and check for clots.  To check valve anatomy and function.  To check for infection on the inside of your heart (endocarditis).  To evaluate the dividing wall (septum) of the heart and presence of a hole that did not close after birth (patent foramen ovale or atrial septal defect).  To help diagnose a tear in the wall of the aorta (aortic dissection).  During cardiac valve surgery. This allows the surgeon to assess the valve repair before closing the chest.  During a variety of other cardiac procedures to guide positioning of catheters.  Sometimes before a cardioversion, which is a shock to convert heart rhythm back to normal. LET [facility] CARE PROVIDER KNOW ABOUT:   Any allergies you have.  All medicines you are taking, including vitamins, herbs, eye drops, creams, and over-the-counter medicines.  Previous problems you or members of your family have had with the use of anesthetics.  Any blood disorders you have.  Previous surgeries you have had.  Medical conditions you have.  Swallowing difficulties.  An esophageal obstruction. RISKS AND COMPLICATIONS  Generally, TEE is a safe procedure. However, as with any procedure, complications can occur. Possible complications include an esophageal tear (rupture). BEFORE THE PROCEDURE   Do not eat or drink for 6 hours before the procedure or as directed by your health care provider.  Arrange for someone to drive you home after the procedure. Do not drive yourself home. During the procedure, you will be given  medicines that can continue to make you feel drowsy and can impair your reflexes.  An IV access tube will be started in the arm. PROCEDURE   A medicine to help you relax (sedative) will be given through the IV access tube.  A medicine that numbs the area (local anesthetic) may be sprayed in the back of the throat.  Your blood pressure, heart rate, and breathing (vital signs) will be monitored during the procedure.  The TEE probe is a long, flexible tube. The tip of the probe is placed into the back of the mouth, and you will be asked to swallow. This helps to pass the tip of the probe into the esophagus. Once the tip of the probe is in the correct area, your health care provider can take pictures of the heart.  TEE is usually not a painful procedure. You may feel the probe press against the back of the throat. The probe does not enter the trachea and does not affect your breathing.  Your time spent at the hospital is usually less than 2 hours. AFTER THE PROCEDURE   You will be in bed, resting, until you have fully returned to consciousness.  When you first awaken, your throat may feel slightly sore and will probably still feel numb. This will improve slowly over time.  You will not be allowed to eat or drink until it is clear that  the numbness has improved.  Once you have been able to drink, urinate, and sit on the edge of the bed without feeling sick to your stomach (nausea) or dizzy, you may be cleared to go home.  You should have a friend or family member with you for the next 24 hours after your procedure. Document Released: 01/14/2003 Document Revised: 08/14/2013 Document Reviewed: 04/25/2013 [facility] Patient Information 2014 [place], [place].

## 2014-01-22 NOTE — H&P (View-Only) (Signed)
HPI:   80 year-old gentleman referred by Dr [name] for interventional evaluation of symptomatic CAD and aortic stenosis. The patient has a complex history dating back to 2009 when he was diagnosed with MVP and associated severe MR as well as atrial fibrillation. He was noted to have 70% calcified stenosis at the LAD ostium and moderate disease in his other coronary arteries. He underwent robotic mitral valve repair at [facility] with grafting of the LAD using a SVG conduit because the LIMA was inadequate. He underwent Cryoablation and ultimately required mitral valve replacement with a 29 mm St Jude Biocor prosthesis. His post-op course was complicated by an embolic stroke from which he has had a full recovery. He has undergone placement of a permanent pacemaker and is pacer-dependent. He's maintained on amiodarone for PAF with a very low AF burden and he's anticoagulated with warfarin. LV function is mildly depressed at 45-50%.   The patient had done quite well since his valve surgery in 2009, until he began to experience breathlessness over the past 2-3 months. He has been independent and fairly active, still teaching art. Over the past several months, he has developed shortness of breath with walking on level ground (less than one block), and with conversation. He denies cough, chest pain, edema, lightheadedness, or syncope.   The patient is married. He has 4 children, 14 grandchildren, and 24 great grandchildren.   Outpatient Encounter Prescriptions as of 01/16/2014  Medication Sig  . amiodarone (PACERONE) 200 MG tablet Take 1 tablet (200 mg total) by mouth daily.  Marland Kitchen aspirin EC 81 MG tablet Take 81 mg by mouth daily.  . ferrous sulfate 325 (65 FE) MG tablet Take 325 mg by mouth daily with breakfast.  . methimazole (TAPAZOLE) 5 MG tablet Take 1 tablet (5 mg total) by mouth 3 (three) times a week. On Monday, Wednesday and Friday  . metoprolol succinate (TOPROL-XL) 100 MG 24 hr tablet Take 1 tablet (100  mg total) by mouth daily. Take with or immediately following a meal.  . Multiple Vitamin (MULTIVITAMIN WITH MINERALS) TABS Take 1 tablet by mouth daily. Centrum Silver  . pantoprazole (PROTONIX) 40 MG tablet Take 1 tablet (40 mg total) by mouth 2 (two) times daily.  . simvastatin (ZOCOR) 40 MG tablet Take 1 tablet (40 mg total) by mouth at bedtime.  . Tamsulosin HCl (FLOMAX) 0.4 MG CAPS Take 0.4 mg by mouth every evening.   . warfarin (COUMADIN) 3 MG tablet Take 1.5-3 mg by mouth daily. Take 1/2 tablet (1.5mg ) on Monday, Wednesday, and Friday. All other days take a whole tablet of 3mg     Review of patient's allergies indicates no known allergies.  Past Medical History  Diagnosis Date  . GASTRIC POLYP 05/13/2009  . COLONIC POLYPS, ADENOMATOUS 01/14/2008  . HYPERLIPIDEMIA 06/01/2007  . ANEMIA, IRON DEFICIENCY 10/13/2008  . COAGULOPATHY, COUMADIN-INDUCED 01/14/2008  . ANXIETY 09/04/2008  . HYPERTENSION 06/01/2007  . ATRIAL FIBRILLATION, CHRONIC 01/14/2008    on warfarin since 2007  . UNSPECIFIED VENOUS INSUFFICIENCY 09/18/2008  . PLEURAL EFFUSION, LEFT 10/13/2008  . GERD 06/01/2007  . BARRETTS ESOPHAGUS 03/20/2009  . PROSTATE CANCER, HX OF 01/14/2008  . CATARACTS, BILATERAL, HX OF 01/14/2008  . CEREBROVASCULAR ACCIDENT, HX OF 08/18/2008  . PERSONAL HX COLONIC POLYPS 01/01/2010  . BASAL CELL CARCINOMA OF SKIN SITE UNSPECIFIED 09/28/2010  . HYPERTHYROIDISM 01/06/2011  . Hyperglycemia   . Elevated PSA   . Restless leg syndrome   . Mitral regurgitation 06/13/2008    mitral valve  replacement #20 St. Jude Biocor; Maze procedure by Dr. Amador Cunas  at [facility]  . CORONARY ARTERY DISEASE 06/01/2007    Cath 04/03/2008; Cath 11/01/1990  . Tachy-brady syndrome 07/13/2008    medtronic Adapta gen. model ADDR01 ,serial # [id number] H by Dr  [name] at [facility]  . Peripheral artery disease 06/16/2010    LEA doppler-left ABI nml 0.61 calcified vessels;right ABI  0.68  . Edema, peripheral 07/30/2009    LEV doppler- no  thrombus or thrombophlebitis  . Aortic valvular disorder 08/16/2012    echo EF 45-50% LV mildly reduce,Biocor mitral valve,mild to mod. tricuspid regrug.,mild to mod. aortic regrug.  . Mitral valve disorder 09/08/2011    echo EF 50-55% LV normal  . Aortic valvular stenosis 11/19/2013  . Cardiomyopathy, ischemic 11/19/2013    EF 45-50% with inferior wall hypokinesis by echo 2014    Past Surgical History  Procedure Laterality Date  . Prostatectomy    . Tonsillectomy    . Cystectomy      Left leg  . Pacemaker placement  07/13/2008    [place] dual chamber-Medtronic at [facility]   . Coronary artery bypass graft  06/13/2008    graft x1 w/vein graft LAD artery by Dr. Amador [facility]unas at [facility]  . Mitral valve replacement  06/13/2008    #20 St. Jude Bicor mitral valve ;Maze procedure at [facility] by Dr. Amador Cunas  . Cardiac catheterization  11/01/1990  . Skin cancer excision      removed from forehead and right leg, nose/face  . Upper gastrointestinal endoscopy  07/14/2009    erosive reflux esopagitis, Barrett's esophagus  . Colonoscopy  07/14/2009    diverticulosis, internal hemorrhoids  . Cardiovascular stress test  06/03/2010    Persantine perfusion EF 45% mild to moderate ischemia basal and mid inferolateral region  . Cardiovascular stress test  01/01/2008    left ventricle normal,no significant ischemia  . Cardiovascular stress test  09/09/2005     possibility of ischemia inferior wall    History   Social History  . Marital Status: Married    Spouse Name: N/A    Number of Children: 4  . Years of Education: N/A   Occupational History  . Retired    Social History Main Topics  . Smoking status: Former Smoker -- 2 years    Types: Pipe    Quit date: 11/07/1965  . Smokeless tobacco: Never Used     Comment: quit over 40 years ago, never smoked cigarettes  . Alcohol Use: No  . Drug Use: No  . Sexual Activity: Not on file   Other Topics Concern  . Not on file   Social History Narrative  . No  narrative on file    Family History  Problem Relation Age of Onset  . Cancer Mother     Breast Cancer    ROS:  General: no fevers/chills/night sweats Eyes: no blurry vision, diplopia, or amaurosis ENT: no sore throat or hearing loss Resp: no cough, wheezing, or hemoptysis CV: no edema or palpitations GI: no abdominal pain, nausea, vomiting, diarrhea, or constipation GU: no dysuria, frequency, or hematuria Skin: no rash Neuro: no headache, numbness, tingling, or weakness of extremities Musculoskeletal: no joint pain or swelling Heme: no bleeding, DVT, or easy bruising Endo: no polydipsia or polyuria  BP 126/81  Pulse 77  Ht 5\' 10"  (1.778 m)  Wt 178 lb (80.74 kg)  BMI 25.54 kg/m2  PHYSICAL EXAM: Pt is alert and oriented, WD, WN, pleasant elderly male in  no distress. HEENT: normal Neck: JVP normal. Carotid upstrokes normal. No thyromegaly. Lungs: equal expansion, clear bilaterally CV: Apex is discrete and nondisplaced, RRR with grade 3/6 harsh systolic murmur at the LSB Abd: soft, NT, +BS, no bruit, no hepatosplenomegaly Back: no CVA tenderness Ext: no C/C/E        DP/PT pulses intact and = Skin: warm and dry without rash Neuro: CNII-XII intact             Strength intact = bilaterally  2D Echo 08/20/2013: Study Conclusions  - Left ventricle: The cavity size was normal. There was severe concentric hypertrophy. Systolic function was mildly reduced. The estimated ejection fraction was in the range of 45% to 50%. Incoordinate septal motion. Basal to mid inferior akinesis. The study is not technically sufficient to allow evaluation of LV diastolic function. The E/e' ratio is >25, suggesting extremely high LV filling pressure. - Aortic valve: Heavily calcified aortic valve with restricted leaflet motion. Peak and mean gradients of 46 and 29 mmHg, respectively. Based on an LVOT diameter of 2.1cm, the calculated AVA is 0.7-0.8 cm2. There is discrepancy between the  calculated valve area and gradient. Dobutamine stress echo may be helpful in an asymptomatic patient to see if a provokable gradient is present, given his reduced EF. Mild regurgitation. Valve area: 0.75cm^2(VTI). Valve area: 0.84cm^2 (Vmax). - Mitral valve: Porcine mitral valve bioprosthetic. Well-seated. There is mild mitral stenosis. The peak and mean gradients are 20 mmHg and 7 mmHG, respectively. The P1/2 T is 109 ms. Indexed valve area by pressure half-time: 1.05cm^2/m^2. Valve area by continuity equation (using LVOT flow): 1.24cm^2. - Left atrium: Severely dilated - 75 ml/m2. LA Volume/ BSA = 75.5 ml/m2 - Right ventricle: The cavity size was mildly dilated. Wall thickness was normal. Pacer wire or catheter noted in right ventricle. RV systolic pressure: 0000000 Hg (S, est). - Right atrium: Moderately dilated (21.6 cm2). Pacer wire or catheter noted in right atrium. - Tricuspid valve: Moderate regurgitation. - Pulmonary arteries: PA peak pressure: 22mm Hg (S). - Inferior vena cava: The vessel was dilated; the respirophasic diameter changes were blunted (< 50%); findings are consistent with elevated central venous pressure.  Cardiac cath 01/08/2014: Contrast used: 150 mL Omnipaque  Hemodynamic findings:  Aortic pressure 130/70 (mean 96) mm Hg  Left ventricle [id number] with end-diastolic pressure of 21 mm Hg  PA wedge pressure a wave 27, v wave 40 (mean 31) mm Hg  Pulmonary artery 56/33 (mean 43) mm Hg  Right ventricle 58/13 with an end-diastolic pressure of 19 mm Hg  Right atrium a wave 18, v wave 20 (mean 18) mm Hg  Cardiac output is 4.1 L per minute (cardiac index 2.1 L per minute per meter sq), with similar results by thermodilution and Fick methods  Aortic valve area by Gorlin equation 0.98 cm square (index 0.5 cm square/meter squared BSA)  Aortic valve mean gradient 22 mm Hg  Mitral valve area by Gorlin equation 1.2 cm square (0.58 cm square index for body surface area)    Mitral valve mean gradient 13.7 mm Hg at a heart rate of 72 beats per minute - prosthetic valve  Angiographic Findings:  1. The left main coronary artery is free of significant atherosclerosis and bifurcates in the usual fashion into the left anterior descending artery and left circumflex coronary artery.  2. The left anterior descending artery is a large vessel that reaches the apex and generates twomajor diagonal branches. There is evidence of extensve luminal irregularities and severe  calcification. There is a severe eccentric (probably >90%) ostial stenosis of the LAD artery with a large eccentric calcified plaque.  No competitive flow is seen in the distal LAD artery, suggesting graft occlusion.  3. The left circumflex coronary artery is a large-size vessel non- dominant vessel that generates 2 major oblque marginal arteries. There is evidence of extensive luminal irregularities and heavy calcification. No hemodynamically meaningful stenoses are seen.  4. The right coronary artery is a large-size dominant vessel that generates a long posterior lateral ventricular system as well as the PDA. There is evidence of extensive luminal irregularities and the entire length of the vessel is heavily calcified. A 60-70% stenosis is seen in the mid RCA, at the acute margin with an exophytic heavily calcified plaque  5. The left ventricle was not injected There is moderate aortic valve stenosis by pullback. The left ventricular end-diastolic pressure is mildly elevated.  6. the ascending aorta is normal in caliber but is very heavily calcified. No evidence of previously placed saphenous vein graft filling the during ascending aortogram   IMPRESSIONS:  1. moderate aortic stenosis  2. moderate mitral stenosis (iatrogenic status post mitral valve replacement)  3. extensive coronary disease involving both native and graft vessels - interval occlusion of the saphenous vein graft bypass to the LAD artery with  progression of the ostial LAD stenosis, which is now critical  RECOMMENDATION:  Mr. Rumple requires revascularization in the LAD artery territory. The stenosis is ostial and heavily calcified, therefore challenging for treatment with angioplasty and stent. He also has moderate aortic valve stenosis and will require treatment for this in the future. This would make him a candidate for surgical treatment.  Unfortunately the presence of heavy calcification of the ascending aorta, advanced age and history of prior sternotomy placement of a high risk for surgical complications.  The images were reviewed with Dr. Burt Knack and various options for treatment were discussed. The patient will followup with Dr. Burt Knack in the office next week to discuss a high risk percutaneous intervention to the proximal LAD artery. Ideally he'll receive a drug-eluting stent. Consideration will be given to aortic valve replacement with a stent graft after 6-12 months of dual antiplatelet therapy.      ASSESSMENT AND PLAN: 80 year-old male with a complex cardiac history outlined above, presenting for evaluation of progressive dyspnea, now NYHA Class 3. Significant cardiac problems include moderate-severe aortic stenosis, obstructive CAD primarily involving the LAD ostium but also in the mid-RCA, and possible prosthetic mitral stenosis. It is also notable that he is on chronic amiodarone for paroxysmal atrial fibrillation.   I have reviewed his cardiac cath and echo films. Notable findings include a severely calcified and restricted aortic valve. Despite hemodynamic findings suggestive of moderate AS, the appearance of the valve with limited mobility of all 3 leaflets raises suspicion for more severe stenosis. On plain fluoroscopy, the patient has very severe calcification of the ascending aorta. Considering his previous perioperative stroke with cardiac surgery and severely calcified ascending aorta, I do not think he would be a  candidate for redo CABG/AVR.  Recommend the following:  Check CTA Chest to evaluate lung parenchyma and evaluate the possibility of amiodarone-related lung disease (doubt). Also will further evaluate the ascending aorta.   Check TEE to evaluate aortic valve morphology and hemodynamics further. Also evaluate mitral valve prosthesis  Obtain op report from 2009 cardiac surgery  Follow-up after above studies completed.   Considerations will be high-risk PCI of the LAD ostium +/-  RCA with adjunctive atherectomy to treat heavy calcification and allow stent expansion. Also will need to consider multidisciplinary approach with cardiac surgery if his valvular disease is found to be severe.   I appreciate the opportunity to see this nice gentleman and will arrange follow-up after his studies are completed.   [name] 01/20/2014 11:06 PM

## 2014-01-22 NOTE — Op Note (Signed)
INDICATIONS: aortic root disease and aortic stenosis  PROCEDURE:   Informed consent was obtained prior to the procedure. The risks, benefits and alternatives for the procedure were discussed and the patient comprehended these risks.  Risks include, but are not limited to, cough, sore throat, vomiting, nausea, somnolence, esophageal and stomach trauma or perforation, bleeding, low blood pressure, aspiration, pneumonia, infection, trauma to the teeth and death.    After a procedural time-out, the oropharynx was anesthetized with 20% benzocaine spray. The patient was given 3 mg versed and 25 mcg fentanyl for moderate sedation.   The transesophageal probe was inserted in the esophagus and stomach without difficulty and multiple views were obtained.  The patient was kept under observation until the patient left the procedure room.  The patient left the procedure room in stable condition.   Agitated microbubble saline contrast was not administered.  COMPLICATIONS:    There were no immediate complications.  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  Time Spent Directly with the Patient:  45 minutes   [name] [name] 01/22/2014, 2:21 PM

## 2014-01-23 ENCOUNTER — Encounter (HOSPITAL_COMMUNITY): Payer: Self-pay | Admitting: Cardiovascular Disease

## 2014-01-27 ENCOUNTER — Ambulatory Visit (INDEPENDENT_AMBULATORY_CARE_PROVIDER_SITE_OTHER): Payer: Medicare Other | Admitting: Pharmacist Clinician (PhC)/ Clinical Pharmacy Specialist

## 2014-01-27 DIAGNOSIS — Z7901 Long term (current) use of anticoagulants: Secondary | ICD-10-CM

## 2014-01-27 DIAGNOSIS — Z8679 Personal history of other diseases of the circulatory system: Secondary | ICD-10-CM

## 2014-01-27 DIAGNOSIS — I4891 Unspecified atrial fibrillation: Secondary | ICD-10-CM

## 2014-01-27 LAB — POCT INR: INR: 2.6

## 2014-02-04 ENCOUNTER — Encounter: Payer: Self-pay | Admitting: Cardiovascular Disease

## 2014-02-04 ENCOUNTER — Ambulatory Visit: Payer: Medicare Other | Admitting: Cardiovascular Disease

## 2014-02-04 ENCOUNTER — Ambulatory Visit (INDEPENDENT_AMBULATORY_CARE_PROVIDER_SITE_OTHER): Payer: Medicare Other | Admitting: Cardiovascular Disease

## 2014-02-04 VITALS — BP 118/68 | HR 92 | Ht 70.0 in | Wt 171.0 lb

## 2014-02-04 DIAGNOSIS — I350 Nonrheumatic aortic (valve) stenosis: Secondary | ICD-10-CM

## 2014-02-04 DIAGNOSIS — I359 Nonrheumatic aortic valve disorder, unspecified: Secondary | ICD-10-CM

## 2014-02-04 DIAGNOSIS — I2510 Atherosclerotic heart disease of native coronary artery without angina pectoris: Secondary | ICD-10-CM

## 2014-02-04 NOTE — Patient Instructions (Addendum)
You have been referred to Dr. Roxy Manns with [facility]

## 2014-02-05 ENCOUNTER — Other Ambulatory Visit: Payer: Self-pay

## 2014-02-05 ENCOUNTER — Telehealth: Payer: Self-pay | Admitting: Endocrinology

## 2014-02-05 MED ORDER — METHIMAZOLE 5 MG PO TABS: 5.0000 mg | ORAL_TABLET | ORAL | Status: DC

## 2014-02-05 MED ORDER — METOPROLOL SUCCINATE ER 100 MG PO TB24: 100.0000 mg | ORAL_TABLET | Freq: Every day | ORAL | Status: DC

## 2014-02-05 NOTE — Telephone Encounter (Signed)
Done

## 2014-02-05 NOTE — Telephone Encounter (Signed)
Pt needs rx methimazole, metroprolol

## 2014-02-07 ENCOUNTER — Encounter: Payer: Self-pay | Admitting: Cardiovascular Disease

## 2014-02-07 NOTE — Progress Notes (Signed)
HPI:   80 year-old gentleman returning for follow-up evaluation. Please see my 3/12 note for details. In brief, he has progressive and lifestyle-limited dyspnea with exertion, NYHA Class 3 functional class, in the setting of at least moderate and probably severe aortic stenosis, severe calcific proximal LAD stenosis, and hiatal hernia with extensive herniation of bowel loops and stomach above the diaphragm. He's had prior cardiac surgery with SVG-LAD (now occluded) and bioprosthetic mitral valve replacement. He was sent for a TEE to better delineate his valvular heart disease but the study was nondiagnostic because of inability to visualize the heart with abdominal viscera above the diaphragm.   The patient reports no change in symptoms since his last visit. He denies CP, edema, orthopnea, PND, lightheadedness, or resting dyspnea. He continues to experience dyspnea with singing or low-level exertion. He otherwise feels well.  Outpatient Encounter Prescriptions as of 02/04/2014  Medication Sig  . amiodarone (PACERONE) 200 MG tablet Take 1 tablet (200 mg total) by mouth daily.  Marland Kitchen aspirin EC 81 MG tablet Take 81 mg by mouth daily.  . ferrous sulfate 325 (65 FE) MG tablet Take 325 mg by mouth daily with breakfast.  . Multiple Vitamin (MULTIVITAMIN WITH MINERALS) TABS Take 1 tablet by mouth daily. Centrum Silver  . pantoprazole (PROTONIX) 40 MG tablet Take 1 tablet (40 mg total) by mouth 2 (two) times daily.  . simvastatin (ZOCOR) 40 MG tablet Take 1 tablet (40 mg total) by mouth at bedtime.  . Tamsulosin HCl (FLOMAX) 0.4 MG CAPS Take 0.4 mg by mouth every evening.   . warfarin (COUMADIN) 3 MG tablet Take 1.5-3 mg by mouth daily. Take 1/2 tablet (1.5mg ) on Monday, Wednesday, and Friday. All other days take a whole tablet of 3mg   . [DISCONTINUED] methimazole (TAPAZOLE) 5 MG tablet Take 1 tablet (5 mg total) by mouth 3 (three) times a week. On Monday, Wednesday and Friday  . [DISCONTINUED] metoprolol  succinate (TOPROL-XL) 100 MG 24 hr tablet Take 1 tablet (100 mg total) by mouth daily. Take with or immediately following a meal.    No Known Allergies  Past Medical History  Diagnosis Date  . GASTRIC POLYP 05/13/2009  . COLONIC POLYPS, ADENOMATOUS 01/14/2008  . HYPERLIPIDEMIA 06/01/2007  . ANEMIA, IRON DEFICIENCY 10/13/2008  . COAGULOPATHY, COUMADIN-INDUCED 01/14/2008  . ANXIETY 09/04/2008  . HYPERTENSION 06/01/2007  . ATRIAL FIBRILLATION, CHRONIC 01/14/2008    on warfarin since 2007  . UNSPECIFIED VENOUS INSUFFICIENCY 09/18/2008  . PLEURAL EFFUSION, LEFT 10/13/2008  . GERD 06/01/2007  . BARRETTS ESOPHAGUS 03/20/2009  . PROSTATE CANCER, HX OF 01/14/2008  . CATARACTS, BILATERAL, HX OF 01/14/2008  . CEREBROVASCULAR ACCIDENT, HX OF 08/18/2008  . PERSONAL HX COLONIC POLYPS 01/01/2010  . BASAL CELL CARCINOMA OF SKIN SITE UNSPECIFIED 09/28/2010  . HYPERTHYROIDISM 01/06/2011  . Hyperglycemia   . Elevated PSA   . Restless leg syndrome   . Mitral regurgitation 06/13/2008    mitral valve replacement #20 [employer] Biocor; Maze procedure by Dr. Amador Cunas  at [facility]  . CORONARY ARTERY DISEASE 06/01/2007    Cath 04/03/2008; Cath 11/01/1990  . Tachy-brady syndrome 07/13/2008    medtronic Adapta gen. model ADDR01 ,serial # [id number] H by Dr  [name] at [facility]  . Peripheral artery disease 06/16/2010    LEA doppler-left ABI nml 0.61 calcified vessels;right ABI  0.68  . Edema, peripheral 07/30/2009    LEV doppler- no thrombus or thrombophlebitis  . Aortic valvular disorder 08/16/2012    echo EF 45-50%  LV mildly reduce,Biocor mitral valve,mild to mod. tricuspid regrug.,mild to mod. aortic regrug.  . Mitral valve disorder 09/08/2011    echo EF 50-55% LV normal  . Aortic valvular stenosis 11/19/2013  . Cardiomyopathy, ischemic 11/19/2013    EF 45-50% with inferior wall hypokinesis by echo 2014    ROS: Negative except as per HPI  BP 118/68  Pulse 92  Ht 5\' 10"  (1.778 m)  Wt 171 lb (77.565 kg)  BMI 24.54  kg/m2  SpO2 95%  PHYSICAL EXAM: Pt is alert and oriented, NAD HEENT: normal Neck: JVP - normal, carotids 2+=  Lungs: CTA bilaterally CV: RRR with grade 3/6 harsh systolic murmur at the RUSB Abd: soft, NT, Positive BS, no hepatomegaly Ext: no C/C/E, distal pulses intact and equal Skin: warm/dry no rash  2D Echo: Study Conclusions  - Left ventricle: The cavity size was normal. There was severe concentric hypertrophy. Systolic function was mildly reduced. The estimated ejection fraction was in the range of 45% to 50%. Incoordinate septal motion. Basal to mid inferior akinesis. The study is not technically sufficient to allow evaluation of LV diastolic function. The E/e' ratio is >25, suggesting extremely high LV filling pressure. - Aortic valve: Heavily calcified aortic valve with restricted leaflet motion. Peak and mean gradients of 46 and 29 mmHg, respectively. Based on an LVOT diameter of 2.1cm, the calculated AVA is 0.7-0.8 cm2. There is discrepancy between the calculated valve area and gradient. Dobutamine stress echo may be helpful in an asymptomatic patient to see if a provokable gradient is present, given his reduced EF. Mild regurgitation. Valve area: 0.75cm^2(VTI). Valve area: 0.84cm^2 (Vmax). - Mitral valve: Porcine mitral valve bioprosthetic. Well-seated. There is mild mitral stenosis. The peak and mean gradients are 20 mmHg and 7 mmHG, respectively. The P1/2 T is 109 ms. Indexed valve area by pressure half-time: 1.05cm^2/m^2. Valve area by continuity equation (using LVOT flow): 1.24cm^2. - Left atrium: Severely dilated - 75 ml/m2. LA Volume/ BSA = 75.5 ml/m2 - Right ventricle: The cavity size was mildly dilated. Wall thickness was normal. Pacer wire or catheter noted in right ventricle. RV systolic pressure: 35KK Hg (S, est). - Right atrium: Moderately dilated (21.6 cm2). Pacer wire or catheter noted in right atrium. - Tricuspid valve: Moderate  regurgitation. - Pulmonary arteries: PA peak pressure: 72mm Hg (S). - Inferior vena cava: The vessel was dilated; the respirophasic diameter changes were blunted (< 50%); findings are consistent with elevated central venous pressure.  TEE: Impressions:  - Nondiagnostic study.  Only the ascending aorta, aortic arch and descending aorta as well as the pulmonary artery bifurcation could be visualized. Cardiac structures could not be imaged from either midesophageal or gastric views. Review of the chest CT shows a very large diaphragmatic hernia, with loops of transverse colon and the stomach interposed between the esophagus and the heart.  CTA Chest: FINDINGS: There is no pulmonary embolus. There is no mediastinal or hilar lymphadenopathy. Atherosclerosis of the aorta is identified. The heart size is normal. There is no pericardial effusion. There are small bilateral pleural effusions with mild atelectasis of both lungs, right greater than left. There is no focal pneumonia. There is a large herniation of stomach and colon through the esophageal hiatus. The visualized upper abdominal structures demonstrate a cyst in the upper pole right kidney. There is a small calcified nodule in the right thyroid gland. There is a small cyst in the right thyroid gland.  Review of the MIP images confirms the above findings.  IMPRESSION:  No pulmonary embolus. There are small bilateral pleural effusions with mild atelectasis of both lungs, right greater than left. There is no focal pneumonia.  Cardiac Cath: Hemodynamic findings:  Aortic pressure 130/70 (mean 96) mm Hg  Left ventricle [id number] with end-diastolic pressure of 21 mm Hg  PA wedge pressure a wave 27, v wave 40 (mean 31) mm Hg  Pulmonary artery 56/33 (mean 43) mm Hg  Right ventricle 58/13 with an end-diastolic pressure of 19 mm Hg  Right atrium a wave 18, v wave 20 (mean 18) mm Hg  Cardiac output is 4.1 L per minute (cardiac index  2.1 L per minute per meter sq), with similar results by thermodilution and Fick methods  Aortic valve area by Gorlin equation 0.98 cm square (index 0.5 cm square/meter squared BSA)  Aortic valve mean gradient 22 mm Hg  Mitral valve area by Gorlin equation 1.2 cm square (0.58 cm square index for body surface area)  Mitral valve mean gradient 13.7 mm Hg at a heart rate of 72 beats per minute - prosthetic valve  Angiographic Findings:  1. The left main coronary artery is free of significant atherosclerosis and bifurcates in the usual fashion into the left anterior descending artery and left circumflex coronary artery.  2. The left anterior descending artery is a large vessel that reaches the apex and generates twomajor diagonal branches. There is evidence of extensve luminal irregularities and severe calcification. There is a severe eccentric (probably >90%) ostial stenosis of the LAD artery with a large eccentric calcified plaque.  No competitive flow is seen in the distal LAD artery, suggesting graft occlusion.  3. The left circumflex coronary artery is a large-size vessel non- dominant vessel that generates 2 major oblque marginal arteries. There is evidence of extensive luminal irregularities and heavy calcification. No hemodynamically meaningful stenoses are seen.  4. The right coronary artery is a large-size dominant vessel that generates a long posterior lateral ventricular system as well as the PDA. There is evidence of extensive luminal irregularities and the entire length of the vessel is heavily calcified. A 60-70% stenosis is seen in the mid RCA, at the acute margin with an exophytic heavily calcified plaque  5. The left ventricle was not injected There is moderate aortic valve stenosis by pullback. The left ventricular end-diastolic pressure is mildly elevated.  6. the ascending aorta is normal in caliber but is very heavily calcified. No evidence of previously placed saphenous vein graft  filling the during ascending aortogram   IMPRESSIONS:  1. moderate aortic stenosis  2. moderate mitral stenosis (iatrogenic status post mitral valve replacement)  3. extensive coronary disease involving both native and graft vessels - interval occlusion of the saphenous vein graft bypass to the LAD artery with progression of the ostial LAD stenosis, which is now critical  RECOMMENDATION:  Mr. Savko requires revascularization in the LAD artery territory. The stenosis is ostial and heavily calcified, therefore challenging for treatment with angioplasty and stent. He also has moderate aortic valve stenosis and will require treatment for this in the future. This would make him a candidate for surgical treatment.  Unfortunately the presence of heavy calcification of the ascending aorta, advanced age and history of prior sternotomy placement of a high risk for surgical complications.  The images were reviewed with Dr. Burt Knack and various options for treatment were discussed. The patient will followup with Dr. Burt Knack in the office next week to discuss a high risk percutaneous intervention to the proximal LAD artery. Ideally he'll  receive a drug-eluting stent. Consideration will be given to aortic valve replacement with a stent graft after 6-12 months of dual antiplatelet therapy.    ASSESSMENT AND PLAN: 80 year-old male with CAD and valvular heart disease presenting with progressive symptoms of DOE, now with NYHA Class 3 symptoms of shortness of breath. He has 3 major problems that could be contibuting to his sx's:   1. CAD with severe proximal LAD stenosis and moderate-severe distal RCA stenosis  2. Aortic stenosis - moderate to severe (gradients moderate but visually valve appears severely restricted and LVEF is mildly decreased)  3. Abdominal viscera (stomach and bowel loops) herniated above diaphragm  Difficult management situation. He is at high-risk of cardiac surgery as he would be a redo  sternotomy and he has a severely calcified ascending aorta most impressive on plain fluoroscopy but also evident on CT. He had a post-op stroke at the time of his first surgery. The other treatment option for his cardiac disease is PCI of the proximal/ostial LAD +/- PCI of the distal RCA (may favor med Rx for the RCA). This would be high-risk in the setting of aortic stenosis. I think TAVR would be a reasonable consideration for treatment of his aortic stenosis. He is a patient I would normally consider for a dobutamine study to better assess for low-flow AS but with his prox LAD stenosis I don't think a provocative study would be a good idea.  With respect to his hiatal hernia, surgical treatment considerations would have to be delayed until his cardiac disease is addressed.   I reviewed all of the patient's studies today and had extensive discussion with Mr Tracz and his wife. I would favor an approach of proximal LAD PCI followed by consideration of TAVR. They understand that his situation is quite complicated, and before proceeding I would like him to undergo formal cardiac surgery evaluation by Dr Roxy Manns. They are agreeable with this plan. Will arrange follow-up after he sees Dr Roxy Manns.  [name] 02/07/2014 10:42 AM

## 2014-02-07 NOTE — Progress Notes (Signed)
Thanks, Mike.

## 2014-02-17 ENCOUNTER — Encounter: Payer: Self-pay | Admitting: Thoracic Surgery (Cardiothoracic Vascular Surgery)

## 2014-02-17 ENCOUNTER — Institutional Professional Consult (permissible substitution) (INDEPENDENT_AMBULATORY_CARE_PROVIDER_SITE_OTHER): Payer: Medicare Other | Admitting: Thoracic Surgery (Cardiothoracic Vascular Surgery)

## 2014-02-17 ENCOUNTER — Other Ambulatory Visit: Payer: Self-pay | Admitting: Thoracic Surgery (Cardiothoracic Vascular Surgery)

## 2014-02-17 VITALS — BP 129/85 | HR 86 | Resp 20 | Ht 70.0 in | Wt 171.0 lb

## 2014-02-17 DIAGNOSIS — Z951 Presence of aortocoronary bypass graft: Secondary | ICD-10-CM

## 2014-02-17 DIAGNOSIS — I359 Nonrheumatic aortic valve disorder, unspecified: Secondary | ICD-10-CM

## 2014-02-17 DIAGNOSIS — I97190 Other postprocedural cardiac functional disturbances following cardiac surgery: Secondary | ICD-10-CM

## 2014-02-17 DIAGNOSIS — T8209XA Other mechanical complication of heart valve prosthesis, initial encounter: Secondary | ICD-10-CM

## 2014-02-17 DIAGNOSIS — Z952 Presence of prosthetic heart valve: Secondary | ICD-10-CM

## 2014-02-17 DIAGNOSIS — Z8679 Personal history of other diseases of the circulatory system: Secondary | ICD-10-CM

## 2014-02-17 DIAGNOSIS — I050 Rheumatic mitral stenosis: Secondary | ICD-10-CM

## 2014-02-17 DIAGNOSIS — Z953 Presence of xenogenic heart valve: Secondary | ICD-10-CM

## 2014-02-17 DIAGNOSIS — Z9889 Other specified postprocedural states: Secondary | ICD-10-CM

## 2014-02-17 DIAGNOSIS — T8203XA Leakage of heart valve prosthesis, initial encounter: Secondary | ICD-10-CM | POA: Insufficient documentation

## 2014-02-17 DIAGNOSIS — I350 Nonrheumatic aortic (valve) stenosis: Secondary | ICD-10-CM

## 2014-02-17 NOTE — H&P (Addendum)
[address]       [place], [address]             [phone]     CARDIOTHORACIC SURGERY CONSULTATION REPORT  Referring Provider is [name], MD Primary Cardiologist is Sanda Klein, MD PCP is [name], MD  Chief Complaint  Patient presents with  . Aortic Stenosis    Surgical eval for TAVR, cardiac cath 01/08/14, TEE 01/22/14    HPI:  Patient is an 80 year old white male from [place] with history of mitral regurgitation, coronary artery disease, and atrial fibrillation previously followed by Dr. Rollene Fare who underwent mitral valve replacement using a bioprosthetic tissue valve, single-vessel coronary artery bypass grafting, and a limited lesion set Maze procedure in 2009. At that time the patient was referred to Dr. Amador Cunas at [facility] for possible robotic mitral valve repair and maze procedure. However, the patient was noted to have single-vessel coronary artery disease with stenosis of the proximal left anterior descending coronary artery.  Because of this the patient underwent conventional median sternotomy for surgery by Dr. Amador Cunas on 06/13/2008. His mitral valve was replaced using a 29 mm St. Jude Biocor bioprosthetic tissue valve because of severe calcification involving the posterior leaflet. The procedure was notably very complicated and prolonged with problems exposing mitral valve, severe bleeding from torn left atrium requiring pericardial patch closure, and severe bleeding from the ascending thoracic aorta which was reportedly thin walled. In addition, attempted transesophageal echocardiogram performed intraoperatively was severely limited because of the patient's very large hiatal hernia. The patient suffered a stroke manifested as transient left-sided hemiplegia in the immediate postoperative period but symptoms improved rapidly and the patient ultimately recovered without significant long-term consequences. The patient also required  placement of a permanent pacemaker because of tachybradycardia syndrome with symptomatic bradycardia during his early postoperative convalescence.  Eventually the patient return to normal activity including routine exercise with normal exercise tolerance. He continued to do fairly well but followup echocardiograms have demonstrated the development of aortic stenosis. Most recent transthoracic echocardiogram performed in October of 2014 revealed peak velocity across the aortic valve averaging between 3.3 and 3.4 m/s corresponding to peak and mean transvalvular gradient of 46 and 29 mm mercury respectively with calculated valve area less than 0.8 cm.  At that time there was mild stenosis involving the mitral valve prosthesis with mean transvalvular gradient 7 mm mercury and calculated valve area 1.0-1.2 cm.  He underwent left and right heart catheterization by Dr. [name] on 01/08/2014. This confirmed the presence of at least moderate aortic stenosis with mean transvalvular gradient measured 22 mm mercury by catheterization.  Mean transvalvular gradient across the mitral valve was measured 13.7 mm mercury. There was mild to moderate pulmonary hypertension with PA pressures measured 56/33 and central venous pressure 18. Cardiac output was measured 4.1 L per minute corresponding to a cardiac index of 2.1.  The patient was also noted to have significant two-vessel coronary artery disease and vein graft disease with high-grade 95% proximal stenosis of the left anterior descending coronary artery and ulcerated 60-70% stenosis of the distal right coronary artery with right dominant coronary circulation. The previous saphenous vein graft to the left anterior descending coronary artery was chronically occluded.  The patient was referred to Dr. Burt Knack to consider treatment options for management of severe symptomatic aortic stenosis and subsequently transesophageal echocardiogram was attempted to further characterize the  severity of aortic stenosis and dysfunction related to the bioprosthetic tissue valve in  the mitral position. However, transesophageal echocardiogram was aborted because of inability to obtain adequate views related to the patient's underlying hiatal hernia. The patient has now been referred for surgical consultation to further discuss treatment options.  The patient lives with his wife here and [place]. He has been retired from [employer] for more than 25 years but he continues to work Engineer, mining. He has been physically active for most of his life although he admits that over the past year he has slowed down considerably.  He states that he is limited primarily because of exertional shortness of breath, and over the last few months this has progressed to the point where he now gets short winded regularly with relatively mild activity. This limits his routine daily activities considerably. He denies any resting shortness of breath, PND, orthopnea, or lower extremity edema. He has not had any tachypalpitations or dizzy spells. He has never had any chest pain or chest tightness either with activity or at rest. He does not have significant arthritis or other physical limitations.  Past Medical History  Diagnosis Date  . GASTRIC POLYP 05/13/2009  . COLONIC POLYPS, ADENOMATOUS 01/14/2008  . HYPERLIPIDEMIA 06/01/2007  . ANEMIA, IRON DEFICIENCY 10/13/2008  . COAGULOPATHY, COUMADIN-INDUCED 01/14/2008  . ANXIETY 09/04/2008  . HYPERTENSION 06/01/2007  . ATRIAL FIBRILLATION, CHRONIC 01/14/2008    on warfarin since 2007  . UNSPECIFIED VENOUS INSUFFICIENCY 09/18/2008  . PLEURAL EFFUSION, LEFT 10/13/2008  . GERD 06/01/2007  . BARRETTS ESOPHAGUS 03/20/2009  . PROSTATE CANCER, HX OF 01/14/2008  . CATARACTS, BILATERAL, HX OF 01/14/2008  . CEREBROVASCULAR ACCIDENT, HX OF 08/18/2008  . PERSONAL HX COLONIC POLYPS 01/01/2010  . BASAL CELL CARCINOMA OF SKIN SITE UNSPECIFIED 09/28/2010  . HYPERTHYROIDISM 01/06/2011  . Hyperglycemia     . Elevated PSA   . Restless leg syndrome   . Mitral regurgitation 06/13/2008    mitral valve replacement #20 St. Jude Biocor; Maze procedure by Dr. Amador Cunas  at [facility]  . CORONARY ARTERY DISEASE 06/01/2007    Cath 04/03/2008; Cath 11/01/1990  . Tachy-brady syndrome 07/13/2008    medtronic Adapta gen. model ADDR01 ,serial # [id number] H by Dr  [name] at [facility]  . Peripheral artery disease 06/16/2010    LEA doppler-left ABI nml 0.61 calcified vessels;right ABI  0.68  . Edema, peripheral 07/30/2009    LEV doppler- no thrombus or thrombophlebitis  . Aortic valvular disorder 08/16/2012    echo EF 45-50% LV mildly reduce,Biocor mitral valve,mild to mod. tricuspid regrug.,mild to mod. aortic regrug.  . Mitral valve disorder 09/08/2011    echo EF 50-55% LV normal  . Aortic valvular stenosis 11/19/2013  . Cardiomyopathy, ischemic 11/19/2013    EF 45-50% with inferior wall hypokinesis by echo 2014  . S/P CABG x 1 06/13/2008    SVG to LAD with open vein harvest right thigh, LIMA harvested but not utilized by Dr Amador Cunas  . S/P mitral valve replacement with bioprosthetic valve 06/13/2008    21mm St Jude Biocor porcine bioprosthetic tissue valve by Dr Amador Cunas  . S/P Maze operation for atrial fibrillation 06/13/2008    Partial left atrial lesion set using cryothermy for bilateral pulmonary vein isolation by Dr Amador Cunas  . Prosthetic valve dysfunction     Mitral stenosis involving bioprosthetic tissue valve placed 06/13/2008  . Mitral stenosis     Porcine bioprosthetic tissue valve placed 06/13/2008    Past Surgical History  Procedure Laterality Date  . Prostatectomy    . Tonsillectomy    .  Cystectomy      Left leg  . Pacemaker placement  07/13/2008    [place] dual chamber-Medtronic at [facility]   . Coronary artery bypass graft  06/13/2008    graft x1 w/vein graft LAD artery by Dr. Amador Cunas at [facility]  . Mitral valve replacement  06/13/2008    #20 St. Jude Bicor mitral valve ;Maze procedure at [facility] by Dr.  Amador Cunas  . Cardiac catheterization  11/01/1990  . Skin cancer excision      removed from forehead and right leg, nose/face  . Upper gastrointestinal endoscopy  07/14/2009    erosive reflux esopagitis, Barrett's esophagus  . Colonoscopy  07/14/2009    diverticulosis, internal hemorrhoids  . Cardiovascular stress test  06/03/2010    Persantine perfusion EF 45% mild to moderate ischemia basal and mid inferolateral region  . Cardiovascular stress test  01/01/2008    left ventricle normal,no significant ischemia  . Cardiovascular stress test  09/09/2005     possibility of ischemia inferior wall  . Tee without cardioversion N/A 01/22/2014    Procedure: TRANSESOPHAGEAL ECHOCARDIOGRAM (TEE);  Surgeon: Sanda Klein, MD;  Location: [facility] ENDOSCOPY;  Service: Cardiovascular;  Laterality: N/A;    Family History  Problem Relation Age of Onset  . Cancer Mother     Breast Cancer    History   Social History  . Marital Status: Married    Spouse Name: N/A    Number of Children: 4  . Years of Education: N/A   Occupational History  . Retired    Social History Main Topics  . Smoking status: Former Smoker -- 2 years    Types: Pipe    Quit date: 11/07/1965  . Smokeless tobacco: Never Used     Comment: quit over 40 years ago, never smoked cigarettes  . Alcohol Use: No  . Drug Use: No  . Sexual Activity: Not on file   Other Topics Concern  . Not on file   Social History Narrative  . No narrative on file    Current Outpatient Prescriptions  Medication Sig Dispense Refill  . amiodarone (PACERONE) 200 MG tablet Take 1 tablet (200 mg total) by mouth daily.  90 tablet  1  . aspirin EC 81 MG tablet Take 81 mg by mouth daily.      . ferrous sulfate 325 (65 FE) MG tablet Take 325 mg by mouth daily with breakfast.      . methimazole (TAPAZOLE) 5 MG tablet Take 1 tablet (5 mg total) by mouth 3 (three) times a week. On Monday, Wednesday and Friday  90 tablet  0  . metoprolol succinate (TOPROL-XL)  100 MG 24 hr tablet Take 1 tablet (100 mg total) by mouth daily. Take with or immediately following a meal.  90 tablet  3  . Multiple Vitamin (MULTIVITAMIN WITH MINERALS) TABS Take 1 tablet by mouth daily. Centrum Silver      . pantoprazole (PROTONIX) 40 MG tablet Take 1 tablet (40 mg total) by mouth 2 (two) times daily.  180 tablet  3  . simvastatin (ZOCOR) 40 MG tablet Take 1 tablet (40 mg total) by mouth at bedtime.  90 tablet  2  . Tamsulosin HCl (FLOMAX) 0.4 MG CAPS Take 0.4 mg by mouth every evening.       . warfarin (COUMADIN) 3 MG tablet Take 1.5-3 mg by mouth daily. Take 1/2 tablet (1.5mg ) on Monday, Wednesday, and Friday. All other days take a whole tablet of 3mg   No current facility-administered medications for this visit.    No Known Allergies    Review of Systems:   General:  normal appetite, decreased energy, no weight gain, no weight loss, no fever  Cardiac:  no chest pain with exertion, no chest pain at rest, + SOB with mild exertion, no resting SOB, no PND, no orthopnea, no palpitations, + arrhythmia, + atrial fibrillation, + LE edema, no dizzy spells, no syncope  Respiratory:  + shortness of breath, no home oxygen, no productive cough, no dry cough, no bronchitis, no wheezing, no hemoptysis, no asthma, no pain with inspiration or cough, no sleep apnea, no CPAP at night  GI:   no difficulty swallowing, + reflux, no frequent heartburn, + hiatal hernia, no abdominal pain, no constipation, no diarrhea, no hematochezia, no hematemesis, no melena  GU:   no dysuria,  + frequency, no urinary tract infection, no hematuria, no enlarged prostate, no kidney stones, no kidney disease  Vascular:  no pain suggestive of claudication, no pain in feet, no leg cramps, no varicose veins, no DVT, no non-healing foot ulcer  Neuro:   + stroke, no TIA's, no seizures, no headaches, no temporary blindness one eye,  no slurred speech, no peripheral neuropathy, no chronic pain, no instability of  gait, no memory/cognitive dysfunction  Musculoskeletal: no arthritis, no joint swelling, no myalgias, no difficulty walking, normal mobility   Skin:   chronic rash both lower legs, no itching, no skin infections, no pressure sores or ulcerations  Psych:   no anxiety, no depression, no nervousness, no unusual recent stress  Eyes:   no blurry vision, no floaters, no recent vision changes, + wears glasses or contacts  ENT:   no hearing loss, no loose or painful teeth, no dentures, last saw dentist November 2014  Hematologic:  no easy bruising, no abnormal bleeding, no clotting disorder, no frequent epistaxis  Endocrine:  no diabetes, does not check CBG's at home     Physical Exam:   BP 129/85  Pulse 86  Resp 20  Ht 5\' 10"  (1.778 m)  Wt 171 lb (77.565 kg)  BMI 24.54 kg/m2  SpO2 97%  General:    well-appearing  HEENT:  Unremarkable   Neck:   no JVD, no bruits, no adenopathy   Chest:   clear to auscultation, symmetrical breath sounds, no wheezes, no rhonchi   CV:   RRR, grade III/VI crescendo/decrescendo systolic murmur   Abdomen:  soft, non-tender, no masses   Extremities:  warm, well-perfused, pulses diminished, 1+ bilateral LE edema, chronic hemosiderosis and skin changes both lower legs  Rectal/GU  Deferred  Neuro:   Grossly non-focal and symmetrical throughout  Skin:   Clean and dry, no rashes, no breakdown   Diagnostic Tests:  Transthoracic Echocardiography  Patient:    Shloime, Creese MR #:       [id number] Study Date: 08/20/2013 Gender:     M Age:        11 Height:     177.8cm Weight:     74.8kg BSA:        1.43m^2 Pt. Status: Room:    REFERRING    Olam Idler     [name], [name]  REFERRING    [name], [name]  ATTENDING    Glenetta Hew  SONOGRAPHER  Marygrace Drought, RCS  PERFORMING   [facility] cc:  ------------------------------------------------------------ LV EF: 45% -    50%  ------------------------------------------------------------ Indications:      424.1 Aortic valve disorders.  ------------------------------------------------------------  History:   PMH:  Porcine MVR (2009)  Atrial fibrillation.   ------------------------------------------------------------ Study Conclusions  - Left ventricle: The cavity size was normal. There was   severe concentric hypertrophy. Systolic function was   mildly reduced. The estimated ejection fraction was in the   range of 45% to 50%. Incoordinate septal motion. Basal to   mid inferior akinesis. The study is not technically   sufficient to allow evaluation of LV diastolic function.   The E/e' ratio is >25, suggesting extremely high LV   filling pressure. - Aortic valve: Heavily calcified aortic valve with   restricted leaflet motion. Peak and mean gradients of 46   and 29 mmHg, respectively. Based on an LVOT diameter of   2.1cm, the calculated AVA is 0.7-0.8 cm2. There is   discrepancy between the calculated valve area and   gradient. Dobutamine stress echo may be helpful in an   asymptomatic patient to see if a provokable gradient is   present, given his reduced EF. Mild regurgitation. Valve   area: 0.75cm^2(VTI). Valve area: 0.84cm^2 (Vmax). - Mitral valve: Porcine mitral valve bioprosthetic.   Well-seated. There is mild mitral stenosis. The peak and   mean gradients are 20 mmHg and 7 mmHG, respectively. The   P1/2 T is 109 ms. Indexed valve area by pressure   half-time: 1.05cm^2/m^2. Valve area by continuity equation   (using LVOT flow): 1.24cm^2. - Left atrium: Severely dilated - 75 ml/m2. LA Volume/ BSA =   75.5 ml/m2 - Right ventricle: The cavity size was mildly dilated. Wall   thickness was normal. Pacer wire or catheter noted in   right ventricle. RV systolic pressure: 0000000 Hg (S, est). - Right atrium: Moderately dilated (21.6 cm2). Pacer wire or   catheter noted in right atrium. - Tricuspid  valve: Moderate regurgitation. - Pulmonary arteries: PA peak pressure: 24mm Hg (S). - Inferior vena cava: The vessel was dilated; the   respirophasic diameter changes were blunted (< 50%);   findings are consistent with elevated central venous   pressure.  ------------------------------------------------------------ Labs, prior tests, procedures, and surgery: Permanent pacemaker system implantation.  Coronary artery bypass grafting. Transthoracic echocardiography.  M-mode, complete 2D, spectral Doppler, and color Doppler.  Height:  Height: 177.8cm. Height: 70in.  Weight:  Weight: 74.8kg. Weight: 164.7lb.  Body mass index:  BMI: 23.7kg/m^2.  Body surface area:    BSA: 1.64m^2.  Blood pressure:     111/79.  Patient status:  Outpatient.  Location:  Echo laboratory.  ------------------------------------------------------------  ------------------------------------------------------------ Left ventricle:  The cavity size was normal. There was severe concentric hypertrophy. Systolic function was mildly reduced. The estimated ejection fraction was in the range of 45% to 50%. Incoordinate septal motion. Basal to mid inferior akinesis. The study is not technically sufficient to allow evaluation of LV diastolic function. The E/e' ratio is >25, suggesting extremely high LV filling pressure.  ------------------------------------------------------------ Aortic valve:  Heavily calcified aortic valve with restricted leaflet motion. Peak and mean gradients of 46 and 29 mmHg, respectively. Based on an LVOT diameter of 2.1cm, the calculated AVA is 0.7-0.8 cm2. There is discrepancy between the calculated valve area and gradient. Dobutamine stress echo may be helpful in an asymptomatic patient to see if a provokable gradient is present, given his reduced EF. Doppler:   Mild regurgitation.    VTI ratio of LVOT to aortic valve: 0.22. Valve area: 0.75cm^2(VTI). Indexed valve area: 0.39cm^2/m^2  (VTI). Peak velocity ratio of LVOT to aortic valve: 0.24. Valve area: 0.84cm^2 (Vmax). Indexed  valve area: 0.44cm^2/m^2 (Vmax).    Mean gradient: 62mm Hg (S). Peak gradient: 30mm Hg (S).  ------------------------------------------------------------ Aorta:  Aortic root: The aortic root was normal in size. Ascending aorta: The ascending aorta was normal in size.  ------------------------------------------------------------ Mitral valve:  Porcine mitral valve bioprosthetic. Well-seated. There is mild mitral stenosis. The peak and mean gradients are 20 mmHg and 7 mmHG, respectively. The P1/2 T is 109 ms.  Doppler:     Indexed valve area by pressure half-time: 1.05cm^2/m^2. Valve area by continuity equation (using LVOT flow): 1.24cm^2. Indexed valve area by continuity equation (using LVOT flow): 0.65cm^2/m^2.    Mean gradient: 69mm Hg (D). Peak gradient: 97mm Hg (D).  ------------------------------------------------------------ Left atrium:  Severely dilated - 75 ml/m2. LA Volume/ BSA = 75.5 ml/m2  ------------------------------------------------------------ Atrial septum:  Poorly visualized.  ------------------------------------------------------------ Right ventricle:  The cavity size was mildly dilated. Wall thickness was normal. The moderator band was prominent. Pacer wire or catheter noted in right ventricle. Systolic function was normal.  ------------------------------------------------------------ Pulmonic valve:    Doppler:   Trivial regurgitation.  ------------------------------------------------------------ Tricuspid valve:   Structurally normal valve.   Dilated annulus at 4.92 cm. Leaflet malcoaptation due to pacing wires.  Doppler:  Transvalvular velocity was within the normal range.  Moderate regurgitation.  ------------------------------------------------------------ Right atrium:  Moderately dilated (21.6 cm2). Pacer wire or catheter noted in right  atrium.  ------------------------------------------------------------ Pericardium:  There was no pericardial effusion.  ------------------------------------------------------------ Systemic veins: Inferior vena cava: The vessel was dilated; the respirophasic diameter changes were blunted (< 50%); findings are consistent with elevated central venous pressure. Diameter: 38mm.  ------------------------------------------------------------  2D measurements        Normal  Doppler measurements   Normal IVC                            Main pulmonary Diam         23 mm     ------  artery Left ventricle                 Pressure,    32 mm Hg  =30 LVID ED,   52.6 mm     43-52   S chord,                         Left ventricle PLAX                           Ea, lat     6.8 cm/s   ------ LVID ES,   35.8 mm     23-38   ann, tiss chord,                         DP PLAX                           E/Ea, lat  26.6        ------ FS, chord,   32 %      >29     ann, tiss     2 PLAX                           DP LVPW, ED   13.7 mm     ------  Ea, med     6.4 cm/s   ------  IVS/LVPW   1.26        <1.3    ann, tiss ratio, ED                      DP Ventricular septum             E/Ea, med  28.2        ------ IVS, ED    17.3 mm     ------  ann, tiss     8 LVOT                           DP Diam, S      21 mm     ------  LVOT Area       3.46 cm^2   ------  Peak vel,  80.8 cm/s   ------ Aorta                          S Root diam,   39 mm     ------  VTI, S     16.8 cm     ------ ED                             Aortic valve Left atrium                    Peak vel,   333 cm/s   ------ AP dim       57 mm     ------  S AP dim     2.97 cm/m^2 <2.2    Mean vel,   249 cm/s   ------ index                          S Right ventricle                VTI, S     77.4 cm     ------ RVID ED,   35.2 mm     19-38   Mean         28 mm Hg  ------ PLAX                           gradient,                                S                                 Peak         44 mm Hg  ------                                gradient,                                S                                VTI ratio  0.22        ------  LVOT/AV                                Area, VTI  0.75 cm^2   ------                                Area index 0.39 cm^2/m ------                                (VTI)           ^2                                Peak vel   0.24        ------                                ratio,                                LVOT/AV                                Area, Vmax 0.84 cm^2   ------                                Area index 0.44 cm^2/m ------                                (Vmax)          ^2                                Regurg PHT  302 ms     ------                                Mitral valve                                Peak E vel  181 cm/s   ------                                Mean vel,   105 cm/s   ------                                D                                Decelerati  285 ms     150-23  on time                0                                Pressure    109 ms     ------                                half-time                                Mean          6 mm Hg  ------                                gradient,                                D                                Peak         20 mm Hg  ------                                gradient,                                D                                Area index 1.05 cm^2/m ------                                (PHT)           ^2                                Area       1.24 cm^2   ------                                (LVOT)                                continuity                                Area index 0.65 cm^2/m ------                                (LVOT           Kingsley Callander  cont)                                Annulus      47 cm      ------                                VTI                                Tricuspid valve                                Regurg      259 cm/s   ------                                peak vel                                Peak RV-RA   27 mm Hg  ------                                gradient,                                S                                Systemic veins                                Estimated     5 mm Hg  ------                                CVP                                Right ventricle                                Pressure,    32 mm Hg  <30                                S                                Sa vel,     4.9 cm/s   ------                                lat ann,  tiss DP   ------------------------------------------------------------ Prepared and Electronically Authenticated by  Lyman Bishop 2014-10-14T13:28:26.903     Transesophageal Echocardiography  Patient:    Dreysen, Christin MR #:       [id number] Study Date: 01/22/2014 Gender:     M Age:        27 Height:     177.8cm Weight:     75kg BSA:        1.70m^2 Pt. Status: Room:       [facility]    SONOGRAPHER  Florentina Jenny, RDCS  ORDERING     Lissa Hoard  ADMITTING    [name], [name]  ATTENDING    [name], [name]  PERFORMING   [name], [name] cc:  ------------------------------------------------------------  ------------------------------------------------------------ Indications:      Aortic stenosis 424.1.  ------------------------------------------------------------ Impressions:  - Nondiagnostic study.     Only the ascending aorta, aortic arch and descending aorta   as well as the pulmonary artery bifurcation could be   visualized.   Cardiac structures could not be imaged from either   midesophageal or gastric views.   Review of the chest CT shows a very large diaphragmatic   hernia, with loops of transverse  colon and the stomach   interposed between the esophagus and the heart. Transesophageal echocardiography.  2D and color Doppler. Height:  Height: 177.8cm. Height: 70in.  Weight:  Weight: 75kg. Weight: 165lb.  Body mass index:  BMI: 23.7kg/m^2. Body surface area:    BSA: 1.34m^2.  Blood pressure: 122/77.  Patient status:  Inpatient.  Location:  Endoscopy.   ------------------------------------------------------------  ------------------------------------------------------------ Prepared and Electronically Authenticated by  [name], [name] 2015-03-18T18:[id number]     INDICATIONS: aortic root disease and aortic stenosis  PROCEDURE:   Informed consent was obtained prior to the procedure. The risks, benefits and alternatives for the procedure were discussed and the patient comprehended these risks.  Risks include, but are not limited to, cough, sore throat, vomiting, nausea, somnolence, esophageal and stomach trauma or perforation, bleeding, low blood pressure, aspiration, pneumonia, infection, trauma to the teeth and death.    After a procedural time-out, the oropharynx was anesthetized with 20% benzocaine spray. The patient was given 3 mg versed and 25 mcg fentanyl for moderate sedation.   The transesophageal probe was inserted in the esophagus and stomach without difficulty and multiple views were obtained.  The patient was kept under observation until the patient left the procedure room.  The patient left the procedure room in stable condition.   Agitated microbubble saline contrast was not administered.  COMPLICATIONS:    There were no immediate complications.  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  Time Spent Directly with the Patient:  45 minutes   [name],[name] 01/22/2014, 2:21 PM     CARDIAC CATHETERIZATION  REPORT    Procedures performed:   1. Left heart catheterization   2. Selective coronary angiography   3. Left ventriculography   Reason for procedure:  CAD status post CABG Congestive heart failure Valvular heart disease:   Procedure performed by: Sanda Klein, MD, [facility]  Complications: none   Estimated blood loss: less than 5 mL   History:  He is here to undergo radical left heart catheterization to clarify the severity of aortic stenosis and possible need for referral to TAVR.  Less than 2 months have passed since  Mr. Rempe last appointment and he now has noticed progressively worsening exertional dyspnea. Previously asymptomatic, he now appears to have NYHA class II dyspnea on exertion. Denies chest pain or syncope with activity.   His cardiac problems date back to 2009 when he was diagnosed with severe mitral insufficiency related to mitral valve prolapse. He was also having problems with paroxysmal atrial fibrillation. Coronary angiography demonstrated moderate coronary artery disease primarily a 70% calcified eccentric proximal LAD lesion with moderate stenoses in the other 2 major coronary arteries. He was referred for robotic mitral valve repair by Dr. Amador Cunas at [facility]. The LIMA was found to be a very small vessel. He received a saphenous vein graft bypass to the LAD. Cryoablation was performed for atrial fibrillation. The mitral valve was replaced with a 29 mm [employer] Biocor prosthesis. The postoperative course was complicated by an embolic stroke from which she has recovered without sequelae. He received a dual-chamber permanent pacemaker (Medtronic adapta) and is pacemaker dependent with 100% pacing in both the atrium and the ventricle.   He has remained on amiodarone therapy with an extremely low burden of atrial fibrillation. As far as I can tell from the record the last documentation of atrial fibrillation was a one-hour episode that occurred in May of 2012. He also remains on  warfarin therapy.   Subsequently, he has developed progressive aortic valve disease and has at least moderate aortic stenosis with mild to moderate aortic insufficiency. There is some discrepancy between the estimated aortic valve area at 0.7 cm and the aortic gradients (mean 29 mm Hg). Biological mitral valve prosthesis shows mild stenosis with a mean gradient of 7 mm Hg (heart rate not specified). He has a severely dilated left atrium and mild tricuspid insufficiency.   Left ventricular systolic function is mildly depressed with an EF of 45-50% primarily due to RV pacing induced asynchrony.    Consent: The risks, benefits, and details of the procedure were explained to the patient. Risks including death, MI, stroke, bleeding, limb ischemia, renal failure and allergy were described and accepted by the patient. Informed written consent was obtained prior to proceeding.  Technique: The patient was brought to the cardiac catheterization laboratory in the fasting state. He was prepped and draped in the usual sterile fashion. Local anesthesia with 1% lidocaine was administered to the right groin area. Using the modified Seldinger technique a 5 French right common femoral artery sheath was introduced without difficulty. Under fluoroscopic guidance, using 5 [place] JL4, JR and angled pigtail catheters, selective cannulation of the left coronary artery, right coronary artery and left ventricle were respectively performed. Several coronary angiograms in a variety of projections were recorded, as well as a left ventriculogram in the RAO projection. Left ventricular pressure and a pull back to the aorta were recorded. No immediate complications occurred. At the end of the procedure, all catheters were removed. After the procedure, hemostasis will be achieved with manual pressure.  Contrast used: 150 mL Omnipaque  Hemodynamic findings:  Aortic pressure 130/70 (mean 96) mm Hg   Left ventricle 158/14 with  end-diastolic pressure of 21 mm Hg  PA wedge pressure a wave 27, v wave 40 (mean 31) mm Hg  Pulmonary artery 56/33 (mean 43) mm Hg  Right ventricle 58/13 with an end-diastolic pressure of 19 mm Hg  Right atrium a wave 18, v wave 20 (mean 18) mm Hg  Cardiac output is 4.1 L per minute (cardiac index 2.1 L per minute per meter sq), with similar  results by thermodilution and Fick methods  Aortic valve area by Gorlin equation 0.98 cm square (index 0.5 cm square/meter squared BSA) Aortic valve mean gradient 22 mm Hg Mitral valve area by Gorlin equation 1.2 cm square (0.58 cm square index for body surface area) Mitral valve mean gradient 13.7 mm Hg at a heart rate of 72 beats per minute - prosthetic valve  Angiographic Findings:   1. The left main coronary artery is free of significant atherosclerosis and bifurcates in the usual fashion into the left anterior descending artery and left circumflex coronary artery.   2. The left anterior descending artery is a large vessel that reaches the apex and generates twomajor diagonal branches. There is evidence of extensve luminal irregularities and severe calcification. There is a severe eccentric (probably >90%) ostial stenosis of the LAD artery with a large eccentric calcified plaque. No competitive flow is seen in the distal LAD artery, suggesting graft occlusion. 3. The left circumflex coronary artery is a large-size vessel non- dominant vessel that generates 2 major oblque marginal arteries. There is evidence of extensive luminal irregularities and heavy calcification. No hemodynamically meaningful stenoses are seen. 4. The right coronary artery is a large-size dominant vessel that generates a long posterior lateral ventricular system as well as the PDA. There is evidence of extensive luminal irregularities and the entire length of the vessel is heavily calcified.  A 60-70% stenosis is seen in the mid RCA, at the acute margin with an exophytic heavily  calcified plaque 5. The left ventricle was not injected There is moderate aortic valve stenosis by pullback. The left ventricular end-diastolic pressure is mildly elevated.   6. the ascending aorta is normal in caliber but is very heavily calcified. No evidence of previously placed saphenous vein graft filling the during ascending aortogram     IMPRESSIONS:   1. moderate aortic stenosis 2. moderate mitral stenosis (iatrogenic status post mitral valve replacement) 3. extensive coronary disease involving both native and graft vessels - interval occlusion of the saphenous vein graft bypass to the LAD artery with progression of the ostial LAD stenosis, which is now critical   RECOMMENDATION: Mr. Guida requires revascularization in the LAD artery territory. The stenosis is ostial and heavily calcified, therefore challenging for treatment with angioplasty and stent. He also has moderate aortic valve stenosis and will require treatment for this in the future. This would make him a candidate for surgical treatment. Unfortunately the presence of heavy calcification of the ascending aorta, advanced age and history of prior sternotomy placement of a high risk for surgical complications.  The images were reviewed with Dr. Burt Knack and various options for treatment were discussed. The patient will followup with Dr. Burt Knack in the office next week to discuss a high risk percutaneous intervention to the proximal LAD artery. Ideally he'll receive a drug-eluting stent. Consideration will be given to aortic valve replacement with a stent graft after 6-12 months of dual antiplatelet therapy.        CT ANGIOGRAPHY CHEST WITH CONTRAST   TECHNIQUE: Multidetector CT imaging of the chest was performed using the standard protocol during bolus administration of intravenous contrast. Multiplanar CT image reconstructions and MIPs were obtained to evaluate the vascular anatomy.   CONTRAST:  97mL OMNIPAQUE IOHEXOL 350  MG/ML SOLN   COMPARISON:  80 mL Omnipaque 300   FINDINGS: There is no pulmonary embolus. There is no mediastinal or hilar lymphadenopathy. Atherosclerosis of the aorta is identified. The heart size is normal. There is no pericardial  effusion. There are small bilateral pleural effusions with mild atelectasis of both lungs, right greater than left. There is no focal pneumonia. There is a large herniation of stomach and colon through the esophageal hiatus. The visualized upper abdominal structures demonstrate a cyst in the upper pole right kidney. There is a small calcified nodule in the right thyroid gland. There is a small cyst in the right thyroid gland.   Review of the MIP images confirms the above findings.   IMPRESSION: No pulmonary embolus. There are small bilateral pleural effusions with mild atelectasis of both lungs, right greater than left. There is no focal pneumonia.     Electronically Signed   By: Abelardo Diesel M.D.   On: 01/17/2014 11:00     STS Risk Calculator  Procedure    AVR + redo CABG (without redo MVR)  Risk of Mortality   5.1% Morbidity or Mortality  24.9% Prolonged LOS   12.8% Short LOS    23.0% Permanent Stroke   3.1% Prolonged Vent Support  15.4% DSW Infection    0.3% Renal Failure    6.2% Reoperation    10.4%   Impression:  Patient presents with progressive symptoms of exertional shortness of breath consistent with chronic diastolic congestive heart failure (currently New York Heart Association functional class III) associated with at least moderate aortic stenosis, severe two-vessel coronary artery disease and mild to moderate left ventricular systolic dysfunction. I have personally reviewed the patient's most recent transthoracic echocardiogram performed last October as well as his more recent cardiac catheterization and attempted transesophageal echocardiogram. The patient's aortic valve is tricuspid with severe calcification and restricted  leaflet mobility involving all 3 leaflets. Although the peak velocity across the aortic valve measured less than 4 m/s and mean transvalvular gradient was less than 40 mm mercury both by echocardiogram and catheterization, I suspect his aortic stenosis is severe.  The patient has at least mild mitral stenosis with a bioprosthetic tissue valve in the mitral position which likely limits flow, and left ventricular function is clearly less than normal.  Unfortunately, recent attempt at transesophageal echocardiogram was not helpful as neither the aortic valve nor the mitral prosthesis could be visualized.  Images of the bioprosthetic tissue valve in the mitral position on transthoracic echocardiogram performed last October are reasonably good, and at that time the mean transmitral gradient was less than 10 mm mercury.  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  However, his hiatal hernia has been there for years, with similar size and appearance on CT scan performed in 2005.  In addition, it should be noted that the anesthesiologist ran into similar problems with imaging his heart using transesophageal echocardiogram at the time of his surgery at [place] in 2009. Under the circumstances, I think the patient's recent progression of symptoms must be presumed to be related to his underlying cardiac disease which I suspect are related to a combination of progression of severe symptomatic aortic stenosis and high-grade proximal stenosis of the left anterior descending coronary artery.  Dysfunction of the porcine bioprosthetic tissue valve in the mitral position may be contributing further, but overall I not impressed that his mitral valve prosthesis is failing at this time. 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 Under the circumstances, I would not consider this patient a candidate for conventional surgical aortic valve replacement and redo coronary artery bypass grafting regardless of whether or not redo mitral valve replacement was indicated.   Plan:  The patient and his wife were counseled at length regarding treatment alternatives for management of severe symptomatic aortic stenosis, coronary artery disease and possible prosthetic dysfunction of his mitral prosthesis. The risks and benefits of surgical intervention has been discussed in detail, with discussion particularly related to his severely diseased ascending thoracic aorta.  Long-term prognosis with medical therapy was discussed. Alternative approaches such as conventional surgical aortic valve replacement, transcatheter aortic valve replacement, and palliative medical therapy were compared and contrasted at length. This discussion was placed in the context of the patient's own specific clinical presentation and past medical history.  I favor further workup to consider transcatheter aortic valve replacement as an alternative to extreme high-risk conventional surgical aortic valve replacement with percutaneous coronary intervention and stenting to treat the patient's native coronary artery disease. We will obtain a followup transthoracic echocardiogram since recent attempted transesophageal echocardiogram were aborted and his last previous echocardiogram was 6 months ago. We will obtain cardiac gated CT angiogram of the heart to further evaluate the patient's aortic and mitral valve disease and ascertain the anatomical feasibility of a transcatheter approach for history. We will discuss the case at length with Dr. Burt Knack and  Dr. [name], as I would agree that the patient should likely have his high-grade stenosis involving the left anterior descending coronary artery treated first before considering any further interventions. The patient will be followed in the multidisciplinary heart valve clinic.  All of their questions been addressed.  Finally, the patient was educated regarding the fact that he has a bioprosthetic tissue valve in the mitral position and subsequently should be given prophylactic antibiotics prior to all dental procedures and other procedures at risk for causing prosthetic valve endocarditis.    I spent in excess of 90 minutes during the conduct of this office consultation and >50% of this time involved direct face-to-face encounter with the patient for counseling and/or coordination of their care.   Valentina Gu. Roxy Manns, MD 02/17/2014 12:27 PM

## 2014-02-17 NOTE — Patient Instructions (Signed)

## 2014-02-18 ENCOUNTER — Other Ambulatory Visit: Payer: Self-pay | Admitting: *Deleted

## 2014-02-18 DIAGNOSIS — I359 Nonrheumatic aortic valve disorder, unspecified: Secondary | ICD-10-CM

## 2014-02-18 LAB — BASIC METABOLIC PANEL WITH GFR
BUN: 25 mg/dL — ABNORMAL HIGH (ref 6–23)
CO2: 30 mEq/L (ref 19–32)
CREATININE: 0.85 mg/dL (ref 0.50–1.35)
Calcium: 9 mg/dL (ref 8.4–10.5)
Chloride: 109 mEq/L (ref 96–112)
GFR, EST NON AFRICAN AMERICAN: 81 mL/min
GFR, Est African American: >89 mL/min
Glucose, Bld: 85 mg/dL (ref 70–99)
Potassium: 4.4 mEq/L (ref 3.5–5.3)
Sodium: 146 mEq/L — ABNORMAL HIGH (ref 135–145)

## 2014-02-19 ENCOUNTER — Ambulatory Visit: Payer: Medicare Other | Admitting: Cardiovascular Disease

## 2014-02-20 ENCOUNTER — Telehealth: Payer: Self-pay | Admitting: Cardiovascular Disease

## 2014-02-20 ENCOUNTER — Encounter: Payer: Medicare Other | Admitting: *Deleted

## 2014-02-20 NOTE — Telephone Encounter (Signed)
New problem   Pt is having trouble getting the machine to work.   Pt needs a call back for help with this.

## 2014-02-20 NOTE — Telephone Encounter (Signed)
Spoke to patient about [employer] monitor....discovered that he had been taking the wand off before the transmission was complete. I advised patient to hold the wand over his device until the box shut off on its own. Patient voiced understanding. He will resend remote and call back to confirm that it has come through.

## 2014-02-20 NOTE — Telephone Encounter (Signed)
Patient was unable to get his remote transmission to work. Appt made for 4-21 with DC to check ppm. Patient voiced understanding.

## 2014-02-21 ENCOUNTER — Ambulatory Visit (HOSPITAL_COMMUNITY)
Admission: RE | Admit: 2014-02-21 | Discharge: 2014-02-21 | Disposition: A | Discharge status: Home / Self Care | Payer: Medicare Other | Source: Ambulatory Visit | Attending: Thoracic Surgery (Cardiothoracic Vascular Surgery) | Admitting: Thoracic Surgery (Cardiothoracic Vascular Surgery)

## 2014-02-21 ENCOUNTER — Ambulatory Visit (HOSPITAL_COMMUNITY): Payer: Medicare Other

## 2014-02-21 ENCOUNTER — Ambulatory Visit: Payer: Medicare Other | Attending: Thoracic Surgery (Cardiothoracic Vascular Surgery) | Admitting: Physical Therapy

## 2014-02-21 DIAGNOSIS — IMO0001 Reserved for inherently not codable concepts without codable children: Secondary | ICD-10-CM | POA: Insufficient documentation

## 2014-02-21 DIAGNOSIS — I359 Nonrheumatic aortic valve disorder, unspecified: Secondary | ICD-10-CM | POA: Insufficient documentation

## 2014-02-21 DIAGNOSIS — R42 Dizziness and giddiness: Secondary | ICD-10-CM | POA: Insufficient documentation

## 2014-02-21 DIAGNOSIS — Z01818 Encounter for other preprocedural examination: Secondary | ICD-10-CM | POA: Insufficient documentation

## 2014-02-21 MED ORDER — IOHEXOL 350 MG/ML SOLN
70.0000 mL | Freq: Once | INTRAVENOUS | Status: AC | PRN
  Administered 2014-02-21: 70 mL via INTRAVENOUS

## 2014-02-21 MED ORDER — IOHEXOL 350 MG/ML SOLN
80.0000 mL | Freq: Once | INTRAVENOUS | Status: AC | PRN
  Administered 2014-02-21: 80 mL via INTRAVENOUS

## 2014-02-21 MED ORDER — ALBUTEROL SULFATE (2.5 MG/3ML) 0.083% IN NEBU
2.5000 mg | INHALATION_SOLUTION | Freq: Once | RESPIRATORY_TRACT | Status: AC
  Administered 2014-02-21: 2.5 mg via RESPIRATORY_TRACT

## 2014-02-25 ENCOUNTER — Telehealth: Payer: Self-pay | Admitting: Endocrinology

## 2014-02-25 ENCOUNTER — Encounter: Payer: Self-pay | Admitting: Cardiovascular Disease

## 2014-02-25 ENCOUNTER — Ambulatory Visit (INDEPENDENT_AMBULATORY_CARE_PROVIDER_SITE_OTHER): Payer: Medicare Other | Admitting: *Deleted

## 2014-02-25 DIAGNOSIS — I4891 Unspecified atrial fibrillation: Secondary | ICD-10-CM

## 2014-02-25 LAB — MDC_IDC_ENUM_SESS_TYPE_INCLINIC
Battery Impedance: 1336 Ohm
Brady Statistic AP VP Percent: 100 %
Brady Statistic AP VS Percent: 0 %
Brady Statistic AS VS Percent: 0 %
Lead Channel Impedance Value: 399 Ohm
Lead Channel Impedance Value: 699 Ohm
Lead Channel Pacing Threshold Amplitude: 0.5 V
Lead Channel Pacing Threshold Amplitude: 0.5 V
Lead Channel Pacing Threshold Pulse Width: 0.4 ms
Lead Channel Pacing Threshold Pulse Width: 0.4 ms
Lead Channel Setting Pacing Amplitude: 2 V
Lead Channel Setting Pacing Amplitude: 2.5 V
Lead Channel Setting Pacing Pulse Width: 0.4 ms
MDC IDC MSMT BATTERY REMAINING LONGEVITY: 41 mo
MDC IDC MSMT BATTERY VOLTAGE: 2.76 V
MDC IDC SESS DTM: 20150421162456
MDC IDC SET LEADCHNL RV SENSING SENSITIVITY: 2.8 mV
MDC IDC STAT BRADY AS VP PERCENT: 0 %

## 2014-02-25 MED ORDER — METHIMAZOLE 5 MG PO TABS: 5.0000 mg | ORAL_TABLET | ORAL | Status: DC

## 2014-02-25 NOTE — Telephone Encounter (Signed)
Needs rx called in for methimazole

## 2014-02-25 NOTE — Telephone Encounter (Signed)
Rx sent 

## 2014-02-25 NOTE — Progress Notes (Signed)
Pacemaker check in clinic. Normal device function. Thresholds, sensing, impedances consistent with previous measurements. Device programmed to maximize longevity. No mode switch or high ventricular rates noted. Device programmed at appropriate safety margins. Histogram distribution appropriate for patient activity level. Device programmed to optimize intrinsic conduction. Estimated longevity 3.5 years. Patient will follow up with the device clinic on 7-20 @ 9:00am.

## 2014-02-27 NOTE — Telephone Encounter (Signed)
Closed encounter °

## 2014-02-28 ENCOUNTER — Ambulatory Visit (INDEPENDENT_AMBULATORY_CARE_PROVIDER_SITE_OTHER): Payer: Medicare Other | Admitting: Pharmacist Clinician (PhC)/ Clinical Pharmacy Specialist

## 2014-02-28 DIAGNOSIS — Z7901 Long term (current) use of anticoagulants: Secondary | ICD-10-CM

## 2014-02-28 DIAGNOSIS — I4891 Unspecified atrial fibrillation: Secondary | ICD-10-CM

## 2014-02-28 DIAGNOSIS — Z8679 Personal history of other diseases of the circulatory system: Secondary | ICD-10-CM

## 2014-02-28 LAB — POCT INR: INR: 2.1

## 2014-03-03 DIAGNOSIS — Z0279 Encounter for issue of other medical certificate: Secondary | ICD-10-CM

## 2014-03-04 ENCOUNTER — Telehealth: Payer: Self-pay | Admitting: *Deleted

## 2014-03-04 NOTE — Telephone Encounter (Signed)
Pt's last cxr was done 12/05/12  He had a CTA Chest 02/21/14  Pt does not have pending appt here  Please advise if still needs cxr

## 2014-03-04 NOTE — Telephone Encounter (Signed)
Message copied by Rosana Berger on Tue Mar 04, 2014  1:58 PM ------      Message from: Tanda Rockers      Created: Sun Dec 08, 2013  8:57 AM       Be sure he's had f/u cxr by now re LLL nodule ------

## 2014-03-06 LAB — PULMONARY FUNCTION TEST
DL/VA % PRED: 55 %
DL/VA: 2.52 ml/min/mmHg/L
DLCO unc % pred: 17 %
DLCO unc: 5.56 ml/min/mmHg
FEF 25-75 PRE: 0.44 L/s
FEF 25-75 Post: 0.65 L/sec
FEF2575-%Change-Post: 48 %
FEF2575-%PRED-POST: 35 %
FEF2575-%PRED-PRE: 23 %
FEV1-%CHANGE-POST: 15 %
FEV1-%PRED-PRE: 33 %
FEV1-%Pred-Post: 38 %
FEV1-PRE: 0.91 L
FEV1-Post: 1.05 L
FEV1FVC-%Change-Post: 11 %
FEV1FVC-%PRED-PRE: 85 %
FEV6-%CHANGE-POST: 2 %
FEV6-%PRED-POST: 42 %
FEV6-%Pred-Pre: 41 %
FEV6-POST: 1.53 L
FEV6-Pre: 1.5 L
FEV6FVC-%CHANGE-POST: 0 %
FEV6FVC-%Pred-Post: 107 %
FEV6FVC-%Pred-Pre: 107 %
FVC-%CHANGE-POST: 3 %
FVC-%PRED-POST: 39 %
FVC-%PRED-PRE: 38 %
FVC-POST: 1.56 L
FVC-Pre: 1.5 L
Post FEV1/FVC ratio: 68 %
Post FEV6/FVC ratio: 100 %
Pre FEV1/FVC ratio: 60 %
Pre FEV6/FVC Ratio: 100 %
RV % pred: 93 %
RV: 2.54 L
TLC % pred: 57 %
TLC: 4.1 L

## 2014-03-10 ENCOUNTER — Encounter (HOSPITAL_COMMUNITY): Payer: Self-pay | Admitting: Pharmacy Technician

## 2014-03-14 ENCOUNTER — Encounter (HOSPITAL_COMMUNITY): Payer: Medicare Other | Admitting: Cardiovascular Disease

## 2014-03-14 ENCOUNTER — Ambulatory Visit (HOSPITAL_COMMUNITY)
Admission: RE | Admit: 2014-03-14 | Discharge: 2014-03-14 | Disposition: A | Discharge status: Home / Self Care | Payer: Medicare Other | Source: Ambulatory Visit | Attending: Thoracic Surgery (Cardiothoracic Vascular Surgery) | Admitting: Thoracic Surgery (Cardiothoracic Vascular Surgery)

## 2014-03-14 ENCOUNTER — Encounter: Payer: Self-pay | Admitting: Cardiovascular Disease

## 2014-03-14 ENCOUNTER — Encounter (HOSPITAL_COMMUNITY): Payer: Medicare Other | Admitting: Thoracic Surgery (Cardiothoracic Vascular Surgery)

## 2014-03-14 ENCOUNTER — Ambulatory Visit (INDEPENDENT_AMBULATORY_CARE_PROVIDER_SITE_OTHER): Payer: Medicare Other | Admitting: Cardiovascular Disease

## 2014-03-14 VITALS — BP 138/72 | HR 84 | Ht 70.0 in | Wt 176.8 lb

## 2014-03-14 DIAGNOSIS — I2510 Atherosclerotic heart disease of native coronary artery without angina pectoris: Secondary | ICD-10-CM

## 2014-03-14 DIAGNOSIS — I359 Nonrheumatic aortic valve disorder, unspecified: Secondary | ICD-10-CM | POA: Insufficient documentation

## 2014-03-14 MED ORDER — CLOPIDOGREL BISULFATE 75 MG PO TABS: 75.0000 mg | ORAL_TABLET | Freq: Every day | ORAL | Status: DC

## 2014-03-14 MED ORDER — WARFARIN SODIUM 3 MG PO TABS: ORAL_TABLET | ORAL | Status: DC

## 2014-03-14 NOTE — Progress Notes (Signed)
  Echocardiogram 2D Echocardiogram has been performed.  Carney Corners 03/14/2014, 3:57 PM

## 2014-03-14 NOTE — Patient Instructions (Addendum)
You are scheduled for coronary intervention on Mar 24, 2014.  Please follow instruction sheet.   Please stop coumadin at this time.  START Plavix on 03/17/14.

## 2014-03-15 ENCOUNTER — Encounter: Payer: Self-pay | Admitting: Cardiovascular Disease

## 2014-03-15 NOTE — Progress Notes (Signed)
HPI:  Mr Lanoux returns for follow-up of his severe aortic stenosis and CAD.   The patient has a complex history dating back to 2009 when he was diagnosed with MVP and associated severe MR as well as atrial fibrillation. He was noted to have 70% calcified stenosis at the LAD ostium and moderate disease in his other coronary arteries. He underwent robotic mitral valve repair at [facility] with grafting of the LAD using a SVG conduit because the LIMA was inadequate. He underwent Cryoablation and ultimately required mitral valve replacement with a 29 mm St Jude Biocor prosthesis. His post-op course was complicated by an embolic stroke from which he has had a full recovery. He has undergone placement of a permanent pacemaker and is pacer-dependent. He's maintained on amiodarone for PAF with a very low AF burden and he's anticoagulated with warfarin. LV function is mildly depressed at 45-50%.  The patient had done quite well since his valve surgery in 2009, until he began to experience breathlessness over the past 3 months. Over this time period, he has developed shortness of breath with walking on level ground (less than one block), and with conversation. He denies cough, chest pain, edema, lightheadedness, or syncope. He also describes marked shortness of breath with bending over.  He has undergone extensive evaluation with 2-D echocardiography, cardiac catheterization, transesophageal echo, gated cardiac CTA, and CTA of abdomen and pelvis. Results of these studies are outlined below. In brief, the patient has severe calcific disease of his proximal LAD extending back into the ostium of the LAD. The saphenous vein graft to LAD is occluded. He otherwise has moderate calcific disease of the distal right coronary artery and patency of the left circumflex. His LV function is mildly depressed with paradoxical septal motion consistent with his paced rhythm. The patient's aortic valve is severely calcified and restricted  with a calculated valve area of 0.7 cm, but mean gradient of only 29 mm mercury.  Since the patient was last seen, his symptoms are stable. He denies chest pain, lightheadedness, or syncope. He denies orthopnea or PND. He continues to be limited by shortness of breath. He returns today for further discussion of treatment options.   Outpatient Encounter Prescriptions as of 03/14/2014  Medication Sig  . amiodarone (PACERONE) 200 MG tablet Take 1 tablet (200 mg total) by mouth daily.  Marland Kitchen aspirin EC 81 MG tablet Take 81 mg by mouth daily.  . ferrous sulfate 325 (65 FE) MG tablet Take 325 mg by mouth daily with breakfast.  . methimazole (TAPAZOLE) 5 MG tablet Take 5 mg by mouth 3 (three) times a week. On Monday, Wednesday and Friday  . metoprolol succinate (TOPROL-XL) 100 MG 24 hr tablet Take 100 mg by mouth daily. Take with or immediately following a meal.  . Multiple Vitamin (MULTIVITAMIN WITH MINERALS) TABS Take 1 tablet by mouth daily. Centrum Silver  . pantoprazole (PROTONIX) 40 MG tablet Take 1 tablet (40 mg total) by mouth 2 (two) times daily.  . simvastatin (ZOCOR) 40 MG tablet Take 1 tablet (40 mg total) by mouth at bedtime.  . Tamsulosin HCl (FLOMAX) 0.4 MG CAPS Take 0.4 mg by mouth every evening.   . warfarin (COUMADIN) 3 MG tablet ON hold starting 03/14/14  . [DISCONTINUED] warfarin (COUMADIN) 3 MG tablet Take 1.5-3 mg by mouth daily. Take 1/2 tablet (1.5mg ) on Monday, Wednesday, and Friday. All other days take a whole tablet of 3mg   . clopidogrel (PLAVIX) 75 MG tablet Take 1 tablet (75  mg total) by mouth daily.  . [DISCONTINUED] methimazole (TAPAZOLE) 5 MG tablet Take 1 tablet (5 mg total) by mouth 3 (three) times a week. On Monday, Wednesday and Friday  . [DISCONTINUED] metoprolol succinate (TOPROL-XL) 100 MG 24 hr tablet Take 1 tablet (100 mg total) by mouth daily. Take with or immediately following a meal.    Allergies  Allergen Reactions  . Penicillins Hives    Past Medical  History  Diagnosis Date  . GASTRIC POLYP 05/13/2009  . COLONIC POLYPS, ADENOMATOUS 01/14/2008  . HYPERLIPIDEMIA 06/01/2007  . ANEMIA, IRON DEFICIENCY 10/13/2008  . COAGULOPATHY, COUMADIN-INDUCED 01/14/2008  . ANXIETY 09/04/2008  . HYPERTENSION 06/01/2007  . ATRIAL FIBRILLATION, CHRONIC 01/14/2008    on warfarin since 2007  . UNSPECIFIED VENOUS INSUFFICIENCY 09/18/2008  . PLEURAL EFFUSION, LEFT 10/13/2008  . GERD 06/01/2007  . BARRETTS ESOPHAGUS 03/20/2009  . PROSTATE CANCER, HX OF 01/14/2008  . CATARACTS, BILATERAL, HX OF 01/14/2008  . CEREBROVASCULAR ACCIDENT, HX OF 08/18/2008  . PERSONAL HX COLONIC POLYPS 01/01/2010  . BASAL CELL CARCINOMA OF SKIN SITE UNSPECIFIED 09/28/2010  . HYPERTHYROIDISM 01/06/2011  . Hyperglycemia   . Elevated PSA   . Restless leg syndrome   . Mitral regurgitation 06/13/2008    mitral valve replacement #20 St. Jude Biocor; Maze procedure by Dr. Amador Cunas  at [facility]  . CORONARY ARTERY DISEASE 06/01/2007    Cath 04/03/2008; Cath 11/01/1990  . Tachy-brady syndrome 07/13/2008    medtronic Adapta gen. model ADDR01 ,serial # [id number] H by Dr  [name] at [facility]  . Peripheral artery disease 06/16/2010    LEA doppler-left ABI nml 0.61 calcified vessels;right ABI  0.68  . Edema, peripheral 07/30/2009    LEV doppler- no thrombus or thrombophlebitis  . Aortic valvular disorder 08/16/2012    echo EF 45-50% LV mildly reduce,Biocor mitral valve,mild to mod. tricuspid regrug.,mild to mod. aortic regrug.  . Mitral valve disorder 09/08/2011    echo EF 50-55% LV normal  . Aortic valvular stenosis 11/19/2013  . Cardiomyopathy, ischemic 11/19/2013    EF 45-50% with inferior wall hypokinesis by echo 2014  . S/P CABG x 1 06/13/2008    SVG to LAD with open vein harvest right thigh, LIMA harvested but not utilized by Dr Amador Cunas  . S/P mitral valve replacement with bioprosthetic valve 06/13/2008    75mm St Jude Biocor porcine bioprosthetic tissue valve by Dr Amador Cunas  . S/P Maze operation for  atrial fibrillation 06/13/2008    Partial left atrial lesion set using cryothermy for bilateral pulmonary vein isolation by Dr Amador Cunas  . Prosthetic valve dysfunction     Mitral stenosis involving bioprosthetic tissue valve placed 06/13/2008  . Mitral stenosis     Porcine bioprosthetic tissue valve placed 06/13/2008   ROS: Negative except as per HPI  BP 138/72  Pulse 84  Ht 5\' 10"  (1.778 m)  Wt 176 lb 12.8 oz (80.196 kg)  BMI 25.37 kg/m2  PHYSICAL EXAM: Pt is alert and oriented, pleasant elderly male in NAD HEENT: normal Neck: JVP - normal, carotids delayed and diminished Lungs: CTA bilaterally CV: RRR with grade 3/6 harsh systolic murmur at the upper sternal border with diminished A2 Abd: soft, NT, Positive BS, no hepatomegaly Ext: Trace pretibial edema bilaterally, distal pulses intact and equal Skin: warm/dry no rash  2-D echocardiogram: Left ventricle: The cavity size was normal. There was mild concentric hypertrophy. Systolic function was mildly to moderately reduced. The estimated ejection fraction was in the range of 40% to 45%. Images  were inadequate for LV wall motion assessment.  ------------------------------------------------------------ Aortic valve: Severely thickened, severely calcified leaflets. Valve mobility was severely restricted. Doppler: Mild regurgitation. Valve area: 0.76cm^2(VTI). Indexed valve area: 0.38cm^2/m^2 (VTI). Peak velocity ratio of LVOT to aortic valve: 0.21. Valve area: 0.74cm^2 (Vmax). Indexed valve area: 0.37cm^2/m^2 (Vmax). Mean gradient: 12mm Hg (S). Peak gradient: 68mm Hg (S).  ------------------------------------------------------------ Mitral valve: A 73mmBiocor porcine bioprosthesis was present and functioning normally. Gradients are similar to previous studies. Preserved subvalvular apparatus. Gradients are probably normal for this valve. Gradients recorded at a heart rate of 88 bpm. Doppler: No significant regurgitation. Valve  area by pressure half-time: 2.68cm^2. Indexed valve area by pressure half-time: 1.35cm^2/m^2. Mean gradient: 41mm Hg (D). Peak gradient: 12mm Hg (D).  ------------------------------------------------------------ Left atrium: The atrium was severely dilated.  ------------------------------------------------------------ Right ventricle: The cavity size was normal. Wall thickness was normal. Pacer wire or catheter noted in right ventricle. Systolic function was reduced.  ------------------------------------------------------------ Ventricular septum: Septal motion showed paradox. These changes are consistent with right ventricular pacing.  ------------------------------------------------------------ Pulmonic valve: The valve appears to be grossly normal. Doppler: Trivial regurgitation.  ------------------------------------------------------------ Tricuspid valve: Structurally normal valve. Leaflet separation was normal. Doppler: Transvalvular velocity was within the normal range. Mild regurgitation.  ------------------------------------------------------------ Pulmonary artery: Systolic pressure was mildly increased.  ------------------------------------------------------------ Right atrium: The atrium was mildly dilated. Pacer wire or catheter noted in right atrium.  ------------------------------------------------------------ Pericardium: There was no pericardial effusion.  ------------------------------------------------------------ Systemic veins: Inferior vena cava: The vessel was dilated; the respirophasic diameter changes were blunted (< 50%); findings are consistent with elevated central venous pressure.   gated cardiac CTA: FINDINGS: Aortic Valve: Trileaflet and all 3 leaflets heavily calcified especially the non coronary cusp. There is extensive calcification of the sinotubular junction as well as the annulus extending into the  Anterior mitral  leaflet  Sinotubular Junction: 3.4 cm  Ascending Thoracic Aorta: 4.5 cm with extensive calcification over the greater curvature  Descending Thoracic Aorta: 2.4 cm tortuous with scattered calcifications  Sinus of Valsalva Measurements:  Non-coronary: 33.4 mm  Right -coronary: 33.7 cm  Left -coronary: 34.8 cm  Coronary Artery Height above Annulus:  Left Main: 12.6 mm  Right Coronary: 13.5 mm  Virtual Basal Annulus Measurements:  Maximum/Minimum Diameter: 29.7/21.4 at 35% phase  Perimeter: 85.4 mm  Area: 570 mm2  Optimum Fluoroscopic Angle for Delivery: LAO 21 degrees Cranial 0 degrees Alternatively AP with 22 degrees Caudal  IMPRESSION: 1) Severely calcified trileaflet aortic valve suitable for 29 mm Sapien XT valve. Unfavorable calcification at the sinotubular junction and annulus which extends through the anterior mitral leaflet  2) Mild aortic root dilatation with severe calcification of the anterior curvature  3) Coronary Artery Heights suitable for TAVR  4) Optimal Angiographic delivery angle LAO 21 degrees Cranial 0, or AP with 22 degrees Caudal angulation  5) Huge Hiatal hernia also involving the transverse colon which would preclude TEE guidance of TAVR procedure   CTA of the abdomen and pelvis: VASCULAR MEASUREMENTS PERTINENT TO TAVR:  AORTA:  Minimal Aortic Diameter - 16 x 14 mm mm  Severity of Aortic Calcification - Severe  RIGHT PELVIS:  Right Common Iliac Artery -  Minimal Diameter - 10.4 x 10.8 mm  Tortuosity - Moderate  Calcification - Severe  Right External Iliac Artery -  Minimal Diameter - 10.1 x 10.3 mm  Tortuosity - Moderate  Calcification - Mild to moderate  Right Common Femoral Artery -  Minimal Diameter - 7.9 x 6.5 mm  Tortuosity - Mild  Calcification - Severe  LEFT PELVIS:  Left Common Iliac Artery -  Minimal Diameter - 11.4 x 12.1 mm  Tortuosity - Moderate  Calcification - Severe  Left External  Iliac Artery -  Minimal Diameter - 7.3 x 12.1 mm  Tortuosity - Mild to moderate  Calcification - Moderate  Left Common Femoral Artery -  Minimal Diameter - 6.6 x 8.0 mm  Tortuosity - Mild  Calcification - Severe  Review of the MIP images confirms the above findings.  IMPRESSION: 1. Findings and measurements pertinent to potential TAVR procedure, as detailed above. Although the patient does appear to have suitable pelvic arterial access, the patient's common femoral arteries are severely calcified bilaterally (right greater than left). 2. Massive hiatal hernia with predominantly intrathoracic stomach, as well as a large portion of the transverse colon which is intrathoracic located between stomach and the adjacent left atrium. This may have implications for intraprocedural transesophageal echocardiography.  ASSESSMENT AND PLAN: 1. Severe calcific aortic stenosis 2. Severe proximal LAD stenosis with occlusion of the saphenous vein graft to LAD 3. Status post mitral valve replacement with a bioprosthetic valve 4. Paroxysmal atrial fibrillation  I have reviewed the patient's cardiac studies in detail. He has been seen by Dr Roxy Manns with cardiac surgery. 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 We favor an approach of PCI of the LAD and consideration of TAVR. The patient has very severe stenosis of the proximal LAD that will require rotational atherectomy and stenting. I reviewed the high-risk nature of this procedure in detail with the patient and his wife. They understand the risk of major complication such as peri-procedural MI and death are in the range of 3-5% because of coronary anatomy and presence of severe aortic stenosis. I have also reviewed all of the standard risks of PCI including but not limited to stroke, MI, bleeding, vascular injury, perforation, tamponade, and death. Regarding antiplatelet  and anticoagulant therapy, I favor temporary discontinuation of warfarin and initiation of clopidogrel. He will remain on aspirin 81 mg. I have discussed this with Dr [name] who has noted the patient has had minimal atrial fibrillation over the last several years based on pacemaker interrogation.   Following successful PCI, TAVR will be considered for treatment of the patient's severe aortic stenosis. While his mean gradient is less than 40 mm Hg, all other aspects of his valve point to severe AS (severe calcification, restricted mobility) and I suspect he has low-flow aortic stenosis. He is not a candidate for dobutamine provocation in the setting of severe obstructive CAD. He will be seen back in the multidisciplinary valve clinic following his PCI procedure.  [name] 03/15/2014 5:09 AM

## 2014-03-24 ENCOUNTER — Encounter (HOSPITAL_COMMUNITY)
Admission: RE | Disposition: A | Discharge status: Home / Self Care | Payer: Medicare Other | Source: Ambulatory Visit | Attending: Cardiovascular Disease

## 2014-03-24 ENCOUNTER — Ambulatory Visit (HOSPITAL_COMMUNITY)
Admission: RE | Admit: 2014-03-24 | Discharge: 2014-03-25 | Disposition: A | Discharge status: Home / Self Care | Payer: Medicare Other | Source: Ambulatory Visit | Attending: Cardiovascular Disease | Admitting: Cardiovascular Disease

## 2014-03-24 ENCOUNTER — Other Ambulatory Visit: Payer: Self-pay

## 2014-03-24 ENCOUNTER — Encounter (HOSPITAL_COMMUNITY): Payer: Self-pay | Admitting: General Practice

## 2014-03-24 DIAGNOSIS — I2589 Other forms of chronic ischemic heart disease: Secondary | ICD-10-CM | POA: Insufficient documentation

## 2014-03-24 DIAGNOSIS — Z9861 Coronary angioplasty status: Secondary | ICD-10-CM

## 2014-03-24 DIAGNOSIS — K2270 Barrett's esophagus without dysplasia: Secondary | ICD-10-CM | POA: Insufficient documentation

## 2014-03-24 DIAGNOSIS — I209 Angina pectoris, unspecified: Secondary | ICD-10-CM

## 2014-03-24 DIAGNOSIS — E0590 Thyrotoxicosis, unspecified without thyrotoxic crisis or storm: Secondary | ICD-10-CM | POA: Insufficient documentation

## 2014-03-24 DIAGNOSIS — Z860100 Personal history of colon polyps, unspecified: Secondary | ICD-10-CM | POA: Insufficient documentation

## 2014-03-24 DIAGNOSIS — Z7982 Long term (current) use of aspirin: Secondary | ICD-10-CM | POA: Insufficient documentation

## 2014-03-24 DIAGNOSIS — Z7901 Long term (current) use of anticoagulants: Secondary | ICD-10-CM | POA: Insufficient documentation

## 2014-03-24 DIAGNOSIS — I359 Nonrheumatic aortic valve disorder, unspecified: Secondary | ICD-10-CM | POA: Insufficient documentation

## 2014-03-24 DIAGNOSIS — I739 Peripheral vascular disease, unspecified: Secondary | ICD-10-CM | POA: Insufficient documentation

## 2014-03-24 DIAGNOSIS — I350 Nonrheumatic aortic (valve) stenosis: Secondary | ICD-10-CM

## 2014-03-24 DIAGNOSIS — E785 Hyperlipidemia, unspecified: Secondary | ICD-10-CM | POA: Diagnosis present

## 2014-03-24 DIAGNOSIS — Z8673 Personal history of transient ischemic attack (TIA), and cerebral infarction without residual deficits: Secondary | ICD-10-CM | POA: Insufficient documentation

## 2014-03-24 DIAGNOSIS — Z954 Presence of other heart-valve replacement: Secondary | ICD-10-CM | POA: Insufficient documentation

## 2014-03-24 DIAGNOSIS — Z8679 Personal history of other diseases of the circulatory system: Secondary | ICD-10-CM

## 2014-03-24 DIAGNOSIS — K219 Gastro-esophageal reflux disease without esophagitis: Secondary | ICD-10-CM | POA: Insufficient documentation

## 2014-03-24 DIAGNOSIS — G2581 Restless legs syndrome: Secondary | ICD-10-CM | POA: Insufficient documentation

## 2014-03-24 DIAGNOSIS — I255 Ischemic cardiomyopathy: Secondary | ICD-10-CM

## 2014-03-24 DIAGNOSIS — I2510 Atherosclerotic heart disease of native coronary artery without angina pectoris: Secondary | ICD-10-CM

## 2014-03-24 DIAGNOSIS — I10 Essential (primary) hypertension: Secondary | ICD-10-CM | POA: Diagnosis present

## 2014-03-24 DIAGNOSIS — Z8546 Personal history of malignant neoplasm of prostate: Secondary | ICD-10-CM | POA: Insufficient documentation

## 2014-03-24 DIAGNOSIS — I25810 Atherosclerosis of coronary artery bypass graft(s) without angina pectoris: Secondary | ICD-10-CM | POA: Insufficient documentation

## 2014-03-24 DIAGNOSIS — Z85828 Personal history of other malignant neoplasm of skin: Secondary | ICD-10-CM | POA: Insufficient documentation

## 2014-03-24 DIAGNOSIS — F411 Generalized anxiety disorder: Secondary | ICD-10-CM | POA: Insufficient documentation

## 2014-03-24 DIAGNOSIS — Z7902 Long term (current) use of antithrombotics/antiplatelets: Secondary | ICD-10-CM | POA: Insufficient documentation

## 2014-03-24 DIAGNOSIS — I4891 Unspecified atrial fibrillation: Secondary | ICD-10-CM | POA: Insufficient documentation

## 2014-03-24 DIAGNOSIS — Z953 Presence of xenogenic heart valve: Secondary | ICD-10-CM

## 2014-03-24 DIAGNOSIS — I495 Sick sinus syndrome: Secondary | ICD-10-CM

## 2014-03-24 HISTORY — DX: Presence of cardiac pacemaker: Z95.0

## 2014-03-24 HISTORY — PX: CORONARY STENT PLACEMENT: SHX1402

## 2014-03-24 HISTORY — PX: PERCUTANEOUS CORONARY ROTOBLATOR INTERVENTION (PCI-R): SHX5484

## 2014-03-24 LAB — BASIC METABOLIC PANEL
BUN: 21 mg/dL (ref 6–23)
CALCIUM: 8.8 mg/dL (ref 8.4–10.5)
CO2: 25 meq/L (ref 19–32)
CREATININE: 0.84 mg/dL (ref 0.50–1.35)
Chloride: 105 mEq/L (ref 96–112)
GFR calc Af Amer: >90 mL/min (ref >90)
GFR calc non Af Amer: 79 mL/min — ABNORMAL LOW (ref >90)
GLUCOSE: 96 mg/dL (ref 70–99)
Potassium: 4.2 mEq/L (ref 3.7–5.3)
Sodium: 142 mEq/L (ref 137–147)

## 2014-03-24 LAB — POCT ACTIVATED CLOTTING TIME
ACTIVATED CLOTTING TIME: 326 s
Activated Clotting Time: 370 seconds

## 2014-03-24 LAB — CBC
HEMATOCRIT: 36.4 % — AB (ref 39.0–52.0)
Hemoglobin: 12.3 g/dL — ABNORMAL LOW (ref 13.0–17.0)
MCH: 31.9 pg (ref 26.0–34.0)
MCHC: 33.8 g/dL (ref 30.0–36.0)
MCV: 94.5 fL (ref 78.0–100.0)
Platelets: 126 10*3/uL — ABNORMAL LOW (ref 150–400)
RBC: 3.85 MIL/uL — ABNORMAL LOW (ref 4.22–5.81)
RDW: 14.4 % (ref 11.5–15.5)
WBC: 4.4 10*3/uL (ref 4.0–10.5)

## 2014-03-24 LAB — PROTIME-INR
INR: 1.23 (ref 0.00–1.49)
Prothrombin Time: 15.2 seconds (ref 11.6–15.2)

## 2014-03-24 SURGERY — PERCUTANEOUS CORONARY ROTOBLATOR INTERVENTION (PCI-R)
Anesthesia: LOCAL

## 2014-03-24 MED ORDER — MIDAZOLAM HCL 2 MG/2ML IJ SOLN
INTRAMUSCULAR | Status: AC
  Filled 2014-03-24: qty 2

## 2014-03-24 MED ORDER — AMIODARONE HCL 200 MG PO TABS
200.0000 mg | ORAL_TABLET | Freq: Every day | ORAL | Status: DC
  Administered 2014-03-25: 11:00:00 200 mg via ORAL
  Filled 2014-03-24: qty 1

## 2014-03-24 MED ORDER — FERROUS SULFATE 325 (65 FE) MG PO TABS
325.0000 mg | ORAL_TABLET | Freq: Every day | ORAL | Status: DC
  Administered 2014-03-25: 325 mg via ORAL
  Filled 2014-03-24 (×2): qty 1

## 2014-03-24 MED ORDER — SODIUM CHLORIDE 0.9 % IJ SOLN: 3.0000 mL | INTRAMUSCULAR | Status: DC | PRN

## 2014-03-24 MED ORDER — TAMSULOSIN HCL 0.4 MG PO CAPS
0.4000 mg | ORAL_CAPSULE | Freq: Every evening | ORAL | Status: DC
  Administered 2014-03-24: 0.4 mg via ORAL
  Filled 2014-03-24 (×2): qty 1

## 2014-03-24 MED ORDER — AMIODARONE HCL 200 MG PO TABS
200.0000 mg | ORAL_TABLET | Freq: Every day | ORAL | Status: DC
  Filled 2014-03-24: qty 1

## 2014-03-24 MED ORDER — METOPROLOL SUCCINATE ER 100 MG PO TB24
100.0000 mg | ORAL_TABLET | Freq: Every day | ORAL | Status: DC
  Administered 2014-03-25: 100 mg via ORAL
  Filled 2014-03-24: qty 1

## 2014-03-24 MED ORDER — SODIUM CHLORIDE 0.9 % IV SOLN: 250.0000 mL | INTRAVENOUS | Status: DC | PRN

## 2014-03-24 MED ORDER — SODIUM CHLORIDE 0.9 % IV SOLN
INTRAVENOUS | Status: DC
  Administered 2014-03-24: 07:00:00 via INTRAVENOUS

## 2014-03-24 MED ORDER — SODIUM CHLORIDE 0.9 % IV SOLN: 1.0000 mL/kg/h | INTRAVENOUS | Status: AC

## 2014-03-24 MED ORDER — FENTANYL CITRATE 0.05 MG/ML IJ SOLN
INTRAMUSCULAR | Status: AC
  Filled 2014-03-24: qty 2

## 2014-03-24 MED ORDER — HEPARIN SODIUM (PORCINE) 1000 UNIT/ML IJ SOLN
INTRAMUSCULAR | Status: AC
  Filled 2014-03-24: qty 1

## 2014-03-24 MED ORDER — METHIMAZOLE 5 MG PO TABS
5.0000 mg | ORAL_TABLET | ORAL | Status: DC
  Filled 2014-03-24: qty 1

## 2014-03-24 MED ORDER — CLOPIDOGREL BISULFATE 75 MG PO TABS
75.0000 mg | ORAL_TABLET | Freq: Every day | ORAL | Status: DC
  Administered 2014-03-25: 11:00:00 75 mg via ORAL
  Filled 2014-03-24: qty 1

## 2014-03-24 MED ORDER — ASPIRIN 81 MG PO CHEW
81.0000 mg | CHEWABLE_TABLET | ORAL | Status: DC
Start: 2014-03-24 — End: 2014-03-24

## 2014-03-24 MED ORDER — NITROGLYCERIN 0.2 MG/ML ON CALL CATH LAB
INTRAVENOUS | Status: AC
  Filled 2014-03-24: qty 1

## 2014-03-24 MED ORDER — PANTOPRAZOLE SODIUM 40 MG PO TBEC
40.0000 mg | DELAYED_RELEASE_TABLET | Freq: Two times a day (BID) | ORAL | Status: DC
  Administered 2014-03-24 – 2014-03-25 (×2): 40 mg via ORAL
  Filled 2014-03-24 (×2): qty 1

## 2014-03-24 MED ORDER — ADULT MULTIVITAMIN W/MINERALS CH
1.0000 | ORAL_TABLET | Freq: Every day | ORAL | Status: DC
  Administered 2014-03-25: 11:00:00 1 via ORAL
  Filled 2014-03-24 (×2): qty 1

## 2014-03-24 MED ORDER — BIVALIRUDIN 250 MG IV SOLR
INTRAVENOUS | Status: AC
Start: 2014-03-24 — End: 2014-03-24
  Filled 2014-03-24: qty 250

## 2014-03-24 MED ORDER — SODIUM CHLORIDE 0.9 % IV SOLN
0.2500 mg/kg/h | INTRAVENOUS | Status: DC
  Filled 2014-03-24: qty 250

## 2014-03-24 MED ORDER — VERAPAMIL HCL 2.5 MG/ML IV SOLN
INTRAVENOUS | Status: AC
  Filled 2014-03-24: qty 2

## 2014-03-24 MED ORDER — HEPARIN (PORCINE) IN NACL 2-0.9 UNIT/ML-% IJ SOLN
INTRAMUSCULAR | Status: AC
  Filled 2014-03-24: qty 1000

## 2014-03-24 MED ORDER — HEPARIN (PORCINE) IN NACL 2-0.9 UNIT/ML-% IJ SOLN
INTRAMUSCULAR | Status: AC
  Filled 2014-03-24: qty 500

## 2014-03-24 MED ORDER — SODIUM CHLORIDE 0.9 % IJ SOLN
3.0000 mL | Freq: Two times a day (BID) | INTRAMUSCULAR | Status: DC
  Administered 2014-03-24: 3 mL via INTRAVENOUS

## 2014-03-24 MED ORDER — LIDOCAINE HCL (PF) 1 % IJ SOLN
INTRAMUSCULAR | Status: AC
  Filled 2014-03-24: qty 30

## 2014-03-24 MED ORDER — SIMVASTATIN 40 MG PO TABS
40.0000 mg | ORAL_TABLET | Freq: Every day | ORAL | Status: DC
  Administered 2014-03-24: 22:00:00 40 mg via ORAL
  Filled 2014-03-24 (×2): qty 1

## 2014-03-24 MED ORDER — SODIUM CHLORIDE 0.9 % IJ SOLN: 3.0000 mL | Freq: Two times a day (BID) | INTRAMUSCULAR | Status: DC

## 2014-03-24 MED ORDER — ASPIRIN EC 81 MG PO TBEC
81.0000 mg | DELAYED_RELEASE_TABLET | Freq: Every day | ORAL | Status: DC
  Administered 2014-03-25: 11:00:00 81 mg via ORAL
  Filled 2014-03-24: qty 1

## 2014-03-24 NOTE — Progress Notes (Signed)
Site area: right groin  Site Prior to Removal:  Level 0  Pressure Applied For 20 MINUTES    Minutes Beginning at 1135  Manual:   yes  Patient Status During Pull:  AAO X 4  Post Pull Groin Site:  Level 0  Post Pull Instructions Given:  yes  Post Pull Pulses Present:  yes  Dressing Applied:  yes  Comments:  Tolerated procedure well

## 2014-03-24 NOTE — Interval H&P Note (Signed)
History and Physical Interval Note:  03/24/2014 7:43 AM  [name] [name]  has presented today for surgery, with the diagnosis of cad  The various methods of treatment have been discussed with the patient and family. After consideration of risks, benefits and other options for treatment, the patient has consented to  Procedure(s): PERCUTANEOUS CORONARY ROTOBLATOR INTERVENTION (PCI-R) (N/A) as a surgical intervention .  The patient's history has been reviewed, patient examined, no change in status, stable for surgery.  I have reviewed the patient's chart and labs.  Questions were answered to the patient's satisfaction.    Cath Lab Visit (complete for each Cath Lab visit)  Clinical Evaluation Leading to the Procedure:   ACS: no  Non-ACS:    Anginal Classification: CCS II  Anti-ischemic medical therapy: Minimal Therapy (1 class of medications)  Non-Invasive Test Results: No non-invasive testing performed  Prior CABG: Previous CABG  [name] [name]

## 2014-03-24 NOTE — H&P (View-Only) (Signed)
HPI:  [name] [name] returns for follow-up of his severe aortic stenosis and CAD.   The patient has a complex history dating back to 2009 when he was diagnosed with MVP and associated severe [name] as well as atrial fibrillation. He was noted to have 70% calcified stenosis at the LAD ostium and moderate disease in his other coronary arteries. He underwent robotic mitral valve repair at [facility] with grafting of the LAD using a SVG conduit because the LIMA was inadequate. He underwent Cryoablation and ultimately required mitral valve replacement with a 29 mm St Jude Biocor prosthesis. His post-op course was complicated by an embolic stroke from which he has had a full recovery. He has undergone placement of a permanent pacemaker and is pacer-dependent. He's maintained on amiodarone for PAF with a very low AF burden and he's anticoagulated with warfarin. LV function is mildly depressed at 45-50%.  The patient had done quite well since his valve surgery in 2009, until he began to experience breathlessness over the past 3 months. Over this time period, he has developed shortness of breath with walking on level ground (less than one block), and with conversation. He denies cough, chest pain, edema, lightheadedness, or syncope. He also describes marked shortness of breath with bending over.  He has undergone extensive evaluation with 2-D echocardiography, cardiac catheterization, transesophageal echo, gated cardiac CTA, and CTA of abdomen and pelvis. Results of these studies are outlined below. In brief, the patient has severe calcific disease of his proximal LAD extending back into the ostium of the LAD. The saphenous vein graft to LAD is occluded. He otherwise has moderate calcific disease of the distal right coronary artery and patency of the left circumflex. His LV function is mildly depressed with paradoxical septal motion consistent with his paced rhythm. The patient's aortic valve is severely calcified and restricted  with a calculated valve area of 0.7 cm, but mean gradient of only 29 mm mercury.  Since the patient was last seen, his symptoms are stable. He denies chest pain, lightheadedness, or syncope. He denies orthopnea or PND. He continues to be limited by shortness of breath. He returns today for further discussion of treatment options.   Outpatient Encounter Prescriptions as of 03/14/2014  Medication Sig  . amiodarone (PACERONE) 200 MG tablet Take 1 tablet (200 mg total) by mouth daily.  Marland Kitchen aspirin EC 81 MG tablet Take 81 mg by mouth daily.  . ferrous sulfate 325 (65 FE) MG tablet Take 325 mg by mouth daily with breakfast.  . methimazole (TAPAZOLE) 5 MG tablet Take 5 mg by mouth 3 (three) times a week. On Monday, Wednesday and Friday  . metoprolol succinate (TOPROL-XL) 100 MG 24 hr tablet Take 100 mg by mouth daily. Take with or immediately following a meal.  . Multiple Vitamin (MULTIVITAMIN WITH MINERALS) TABS Take 1 tablet by mouth daily. Centrum Silver  . pantoprazole (PROTONIX) 40 MG tablet Take 1 tablet (40 mg total) by mouth 2 (two) times daily.  . simvastatin (ZOCOR) 40 MG tablet Take 1 tablet (40 mg total) by mouth at bedtime.  . Tamsulosin HCl (FLOMAX) 0.4 MG CAPS Take 0.4 mg by mouth every evening.   . warfarin (COUMADIN) 3 MG tablet ON hold starting 03/14/14  . [DISCONTINUED] warfarin (COUMADIN) 3 MG tablet Take 1.5-3 mg by mouth daily. Take 1/2 tablet (1.5mg ) on Monday, Wednesday, and Friday. All other days take a whole tablet of 3mg   . clopidogrel (PLAVIX) 75 MG tablet Take 1 tablet (75  mg total) by mouth daily.  . [DISCONTINUED] methimazole (TAPAZOLE) 5 MG tablet Take 1 tablet (5 mg total) by mouth 3 (three) times a week. On Monday, Wednesday and Friday  . [DISCONTINUED] metoprolol succinate (TOPROL-XL) 100 MG 24 hr tablet Take 1 tablet (100 mg total) by mouth daily. Take with or immediately following a meal.    Allergies  Allergen Reactions  . Penicillins Hives    Past Medical  History  Diagnosis Date  . GASTRIC POLYP 05/13/2009  . COLONIC POLYPS, ADENOMATOUS 01/14/2008  . HYPERLIPIDEMIA 06/01/2007  . ANEMIA, IRON DEFICIENCY 10/13/2008  . COAGULOPATHY, COUMADIN-INDUCED 01/14/2008  . ANXIETY 09/04/2008  . HYPERTENSION 06/01/2007  . ATRIAL FIBRILLATION, CHRONIC 01/14/2008    on warfarin since 2007  . UNSPECIFIED VENOUS INSUFFICIENCY 09/18/2008  . PLEURAL EFFUSION, LEFT 10/13/2008  . GERD 06/01/2007  . BARRETTS ESOPHAGUS 03/20/2009  . PROSTATE CANCER, HX OF 01/14/2008  . CATARACTS, BILATERAL, HX OF 01/14/2008  . CEREBROVASCULAR ACCIDENT, HX OF 08/18/2008  . PERSONAL HX COLONIC POLYPS 01/01/2010  . BASAL CELL CARCINOMA OF SKIN SITE UNSPECIFIED 09/28/2010  . HYPERTHYROIDISM 01/06/2011  . Hyperglycemia   . Elevated PSA   . Restless leg syndrome   . Mitral regurgitation 06/13/2008    mitral valve replacement #20 St. Jude Biocor; Maze procedure by Dr. Amador Cunas  at [facility]  . CORONARY ARTERY DISEASE 06/01/2007    Cath 04/03/2008; Cath 11/01/1990  . Tachy-brady syndrome 07/13/2008    medtronic Adapta gen. model ADDR01 ,serial # [id number] H by Dr  [name] at [facility]  . Peripheral artery disease 06/16/2010    LEA doppler-left ABI nml 0.61 calcified vessels;right ABI  0.68  . Edema, peripheral 07/30/2009    LEV doppler- no thrombus or thrombophlebitis  . Aortic valvular disorder 08/16/2012    echo EF 45-50% LV mildly reduce,Biocor mitral valve,mild to mod. tricuspid regrug.,mild to mod. aortic regrug.  . Mitral valve disorder 09/08/2011    echo EF 50-55% LV normal  . Aortic valvular stenosis 11/19/2013  . Cardiomyopathy, ischemic 11/19/2013    EF 45-50% with inferior wall hypokinesis by echo 2014  . S/P CABG x 1 06/13/2008    SVG to LAD with open vein harvest right thigh, LIMA harvested but not utilized by Dr Amador Cunas  . S/P mitral valve replacement with bioprosthetic valve 06/13/2008    75mm St Jude Biocor porcine bioprosthetic tissue valve by Dr Amador Cunas  . S/P Maze operation for  atrial fibrillation 06/13/2008    Partial left atrial lesion set using cryothermy for bilateral pulmonary vein isolation by Dr Amador Cunas  . Prosthetic valve dysfunction     Mitral stenosis involving bioprosthetic tissue valve placed 06/13/2008  . Mitral stenosis     Porcine bioprosthetic tissue valve placed 06/13/2008   ROS: Negative except as per HPI  BP 138/72  Pulse 84  Ht 5\' 10"  (1.778 m)  Wt 176 lb 12.8 oz (80.196 kg)  BMI 25.37 kg/m2  PHYSICAL EXAM: Pt is alert and oriented, pleasant elderly male in NAD HEENT: normal Neck: JVP - normal, carotids delayed and diminished Lungs: CTA bilaterally CV: RRR with grade 3/6 harsh systolic murmur at the upper sternal border with diminished A2 Abd: soft, NT, Positive BS, no hepatomegaly Ext: Trace pretibial edema bilaterally, distal pulses intact and equal Skin: warm/dry no rash  2-D echocardiogram: Left ventricle: The cavity size was normal. There was mild concentric hypertrophy. Systolic function was mildly to moderately reduced. The estimated ejection fraction was in the range of 40% to 45%. Images  were inadequate for LV wall motion assessment.  ------------------------------------------------------------ Aortic valve: Severely thickened, severely calcified leaflets. Valve mobility was severely restricted. Doppler: Mild regurgitation. Valve area: 0.76cm^2(VTI). Indexed valve area: 0.38cm^2/m^2 (VTI). Peak velocity ratio of LVOT to aortic valve: 0.21. Valve area: 0.74cm^2 (Vmax). Indexed valve area: 0.37cm^2/m^2 (Vmax). Mean gradient: 12mm Hg (S). Peak gradient: 68mm Hg (S).  ------------------------------------------------------------ Mitral valve: A 73mmBiocor porcine bioprosthesis was present and functioning normally. Gradients are similar to previous studies. Preserved subvalvular apparatus. Gradients are probably normal for this valve. Gradients recorded at a heart rate of 88 bpm. Doppler: No significant regurgitation. Valve  area by pressure half-time: 2.68cm^2. Indexed valve area by pressure half-time: 1.35cm^2/m^2. Mean gradient: 41mm Hg (D). Peak gradient: 12mm Hg (D).  ------------------------------------------------------------ Left atrium: The atrium was severely dilated.  ------------------------------------------------------------ Right ventricle: The cavity size was normal. Wall thickness was normal. Pacer wire or catheter noted in right ventricle. Systolic function was reduced.  ------------------------------------------------------------ Ventricular septum: Septal motion showed paradox. These changes are consistent with right ventricular pacing.  ------------------------------------------------------------ Pulmonic valve: The valve appears to be grossly normal. Doppler: Trivial regurgitation.  ------------------------------------------------------------ Tricuspid valve: Structurally normal valve. Leaflet separation was normal. Doppler: Transvalvular velocity was within the normal range. Mild regurgitation.  ------------------------------------------------------------ Pulmonary artery: Systolic pressure was mildly increased.  ------------------------------------------------------------ Right atrium: The atrium was mildly dilated. Pacer wire or catheter noted in right atrium.  ------------------------------------------------------------ Pericardium: There was no pericardial effusion.  ------------------------------------------------------------ Systemic veins: Inferior vena cava: The vessel was dilated; the respirophasic diameter changes were blunted (< 50%); findings are consistent with elevated central venous pressure.   gated cardiac CTA: FINDINGS: Aortic Valve: Trileaflet and all 3 leaflets heavily calcified especially the non coronary cusp. There is extensive calcification of the sinotubular junction as well as the annulus extending into the  Anterior mitral  leaflet  Sinotubular Junction: 3.4 cm  Ascending Thoracic Aorta: 4.5 cm with extensive calcification over the greater curvature  Descending Thoracic Aorta: 2.4 cm tortuous with scattered calcifications  Sinus of Valsalva Measurements:  Non-coronary: 33.4 mm  Right -coronary: 33.7 cm  Left -coronary: 34.8 cm  Coronary Artery Height above Annulus:  Left Main: 12.6 mm  Right Coronary: 13.5 mm  Virtual Basal Annulus Measurements:  Maximum/Minimum Diameter: 29.7/21.4 at 35% phase  Perimeter: 85.4 mm  Area: 570 mm2  Optimum Fluoroscopic Angle for Delivery: LAO 21 degrees Cranial 0 degrees Alternatively AP with 22 degrees Caudal  IMPRESSION: 1) Severely calcified trileaflet aortic valve suitable for 29 mm Sapien XT valve. Unfavorable calcification at the sinotubular junction and annulus which extends through the anterior mitral leaflet  2) Mild aortic root dilatation with severe calcification of the anterior curvature  3) Coronary Artery Heights suitable for TAVR  4) Optimal Angiographic delivery angle LAO 21 degrees Cranial 0, or AP with 22 degrees Caudal angulation  5) Huge Hiatal hernia also involving the transverse colon which would preclude TEE guidance of TAVR procedure   CTA of the abdomen and pelvis: VASCULAR MEASUREMENTS PERTINENT TO TAVR:  AORTA:  Minimal Aortic Diameter - 16 x 14 mm mm  Severity of Aortic Calcification - Severe  RIGHT PELVIS:  Right Common Iliac Artery -  Minimal Diameter - 10.4 x 10.8 mm  Tortuosity - Moderate  Calcification - Severe  Right External Iliac Artery -  Minimal Diameter - 10.1 x 10.3 mm  Tortuosity - Moderate  Calcification - Mild to moderate  Right Common Femoral Artery -  Minimal Diameter - 7.9 x 6.5 mm  Tortuosity - Mild  Calcification - Severe  LEFT PELVIS:  Left Common Iliac Artery -  Minimal Diameter - 11.4 x 12.1 mm  Tortuosity - Moderate  Calcification - Severe  Left External  Iliac Artery -  Minimal Diameter - 7.3 x 12.1 mm  Tortuosity - Mild to moderate  Calcification - Moderate  Left Common Femoral Artery -  Minimal Diameter - 6.6 x 8.0 mm  Tortuosity - Mild  Calcification - Severe  Review of the MIP images confirms the above findings.  IMPRESSION: 1. Findings and measurements pertinent to potential TAVR procedure, as detailed above. Although the patient does appear to have suitable pelvic arterial access, the patient's common femoral arteries are severely calcified bilaterally (right greater than left). 2. Massive hiatal hernia with predominantly intrathoracic stomach, as well as a large portion of the transverse colon which is intrathoracic located between stomach and the adjacent left atrium. This may have implications for intraprocedural transesophageal echocardiography.  ASSESSMENT AND PLAN: 1. Severe calcific aortic stenosis 2. Severe proximal LAD stenosis with occlusion of the saphenous vein graft to LAD 3. Status post mitral valve replacement with a bioprosthetic valve 4. Paroxysmal atrial fibrillation  I have reviewed the patient's cardiac studies in detail. He has been seen by Dr Roxy Manns with cardiac surgery. 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 We favor an approach of PCI of the LAD and consideration of TAVR. The patient has very severe stenosis of the proximal LAD that will require rotational atherectomy and stenting. I reviewed the high-risk nature of this procedure in detail with the patient and his wife. They understand the risk of major complication such as peri-procedural MI and death are in the range of 3-5% because of coronary anatomy and presence of severe aortic stenosis. I have also reviewed all of the standard risks of PCI including but not limited to stroke, MI, bleeding, vascular injury, perforation, tamponade, and death. Regarding antiplatelet  and anticoagulant therapy, I favor temporary discontinuation of warfarin and initiation of clopidogrel. He will remain on aspirin 81 mg. I have discussed this with Dr [name] who has noted the patient has had minimal atrial fibrillation over the last several years based on pacemaker interrogation.   Following successful PCI, TAVR will be considered for treatment of the patient's severe aortic stenosis. While his mean gradient is less than 40 mm Hg, all other aspects of his valve point to severe AS (severe calcification, restricted mobility) and I suspect he has low-flow aortic stenosis. He is not a candidate for dobutamine provocation in the setting of severe obstructive CAD. He will be seen back in the multidisciplinary valve clinic following his PCI procedure.  [name] 03/15/2014 5:09 AM

## 2014-03-24 NOTE — CV Procedure (Signed)
    CARDIAC CATH NOTE  Name: [name] [name] MRN: [id number] DOB: 06/24/1929  Procedure: IVUS, rotational atherectomy, PTCA and stenting of the proximal LAD  Indication: Severe proximal LAD stenosis. 80 -year-old gentleman with known CAD and previous mitral valve replacement/single vessel CABG has been undergoing evaluation because of progressive exertional dyspnea. He was found to have severe proximal LAD stenosis with an occluded vein graft to the LAD. The patient also has severe aortic stenosis. After careful discussion and extensive evaluation, we have elected to proceed with PCI of the proximal LAD using rotational atherectomy. The patient is also being considered for transcatheter aortic valve replacement. His LAD stenosis requires treatment not only for symptom relief, but also to allow for TAVR to be performed. The patient has been evaluated by cardiac surgery and he is not a candidate for redo sternotomy.  Procedural Details: The right groin was prepped, draped, and anesthetized with 1% lidocaine. Using the modified Seldinger technique, a 6 Fr sheath was introduced into the right femoral artery.  Weight-based bivalirudin was given for anticoagulation. Once a therapeutic ACT was achieved, a 6 [place] CLS-4.0 guide catheter was inserted.  Initially, the angiographic appearance of the ostial LAD looked less severe than the previous study. I suspected this was due to better opacification with a guide catheter. I elected to perform IVUS to be certain the lesion was severely diseased. A cougar coronary guidewire was used to cross the lesion.  IC NTG was administered. An IVUS probe was advanced across the lesion and a mechanical pullback was performed. This confirmed heavy calcification and very severe stenosis back to the ostium of the LAD. The plaque was occlusive with the IVUS catheter. We then set up for rotational atherectomy. A Rotafloppy wire was advanced across the lesion into the mid LAD. A 1.5 mm  burr was platformed outside the body at 165,000 rpm. The verbal was then advanced through the guide catheter and a total of 3 runs were made to treat the lesion. Following rotational atherectomy, The lesion was predilated with a 2.75 mm cutting balloon.  The lesion was then stented with a 3.0 x 8 mm Xience Alpine drug-eluting stent.  I tried to advance the IVUS catheter for post-procedural IVUS but it would not advance far enough to evaluate stent apposition. The stent was postdilated with a 3.25 mm noncompliant balloon to 16 atm.  Following PCI, there was 0% residual stenosis and TIMI-3 flow. Final angiography confirmed an excellent result. The patient tolerated the procedure well. There were no immediate procedural complications. Femoral hemostasis will be achieved with manual pressure. The patient was transferred to the post catheterization recovery area for further monitoring.  Lesion Data: Vessel: LAD/proximal (ostial) Percent stenosis (pre): 80 TIMI-flow (pre):  3 Stent:  3.0x8 mm Xience Alpine DES Percent stenosis (post): 0 TIMI-flow (post): 3  Conclusions: Successful PCI of the proximal LAD with a DES using adjunctive rotational atherectomy.   Recommendations: DAPT with ASA/Plavix x 12 months, continued evaluation by multidisciplinary valve team for consideration of TAVR.  [name] 03/24/2014, 9:26 AM

## 2014-03-25 DIAGNOSIS — I2589 Other forms of chronic ischemic heart disease: Secondary | ICD-10-CM

## 2014-03-25 DIAGNOSIS — Z9861 Coronary angioplasty status: Secondary | ICD-10-CM

## 2014-03-25 DIAGNOSIS — I209 Angina pectoris, unspecified: Secondary | ICD-10-CM

## 2014-03-25 DIAGNOSIS — I359 Nonrheumatic aortic valve disorder, unspecified: Secondary | ICD-10-CM

## 2014-03-25 DIAGNOSIS — I495 Sick sinus syndrome: Secondary | ICD-10-CM

## 2014-03-25 DIAGNOSIS — I2510 Atherosclerotic heart disease of native coronary artery without angina pectoris: Secondary | ICD-10-CM

## 2014-03-25 LAB — BASIC METABOLIC PANEL
BUN: 19 mg/dL (ref 6–23)
CO2: 27 mEq/L (ref 19–32)
CREATININE: 0.86 mg/dL (ref 0.50–1.35)
Calcium: 8.5 mg/dL (ref 8.4–10.5)
Chloride: 110 mEq/L (ref 96–112)
GFR calc non Af Amer: 78 mL/min — ABNORMAL LOW (ref >90)
GLUCOSE: 94 mg/dL (ref 70–99)
POTASSIUM: 4.7 meq/L (ref 3.7–5.3)
Sodium: 145 mEq/L (ref 137–147)

## 2014-03-25 LAB — CBC
HEMATOCRIT: 36.1 % — AB (ref 39.0–52.0)
HEMOGLOBIN: 11.7 g/dL — AB (ref 13.0–17.0)
MCH: 31.1 pg (ref 26.0–34.0)
MCHC: 32.4 g/dL (ref 30.0–36.0)
MCV: 96 fL (ref 78.0–100.0)
Platelets: 129 10*3/uL — ABNORMAL LOW (ref 150–400)
RBC: 3.76 MIL/uL — ABNORMAL LOW (ref 4.22–5.81)
RDW: 14.5 % (ref 11.5–15.5)
WBC: 4.6 10*3/uL (ref 4.0–10.5)

## 2014-03-25 MED FILL — Sodium Chloride IV Soln 0.9%: INTRAVENOUS | Qty: 50 | Status: AC

## 2014-03-25 NOTE — Discharge Summary (Signed)
Pt seen post PCI - stable & ready for d/c. ROV as scheduled. Will need to clarify plans for restarting warfarin - defer to primary Cardiologist & Dr. Karle Starch, MD

## 2014-03-25 NOTE — Discharge Instructions (Signed)

## 2014-03-25 NOTE — Discharge Summary (Signed)
Discharge Summary   Patient ID: [name] [name] MRN: [id number], DOB/AGE: April 18, 1929 80 y.o. Admit date: 03/24/2014 D/C date:     03/25/2014  Primary Cardiologist: Dr. [name] / Dr. Burt Knack (for TAVR)  Principal Problem:   CAD S/P percutaneous coronary angioplasty - Xience DES 3.0 mm x 8 mm prox LAD Active Problems:   HYPERLIPIDEMIA   HYPERTENSION   Paroxysmal atrial fibrillation status post cryoablation   CEREBROVASCULAR ACCIDENT, HX OF   HYPERTHYROIDISM   Cardiomyopathy, ischemic   Aortic stenosis   S/P mitral valve replacement with bioprosthetic valve   SSS (sick sinus syndrome)   Admission Dates: 03/24/14 - 03/25/14 Discharge Diagnosis: CAD s/p PCI of the pLAD with a DES using adjunctive rotational atherectomy.    LFY:BOFBPZ [name] [name] is an 80 y.o. male with a history of PAF on coumadin, MVP s/p robotic MVR (29 mm St. Jude Biocor) @ [facility] & SVG-LAD w/ Cryo-Maze, embolic CVA w/ full recovery, SSS -s/p PPM, CAD s/p single vessel CABG and hyperthyroidism who was evaluated in clinic by Dr. Burt Knack on 03/15/14 for progressive exertional dyspnea. He was found to have severe proximal LAD stenosis with an occluded SVG to the LAD and mod-severe AS (~area 0.7cm2, peak grad 29 mmHG). He was planned for elective cardiac cath on 03/24/14. After careful discussion and extensive evaluation, it was elected to proceed with PCI of the proximal LAD using rotational atherectomy. The patient is also being considered for TAVR. His LAD stenosis required treatment not only for symptom relief, but also to allow for TAVR to be performed. The patient has been evaluated by cardiac surgery and he is not a candidate for redo sternotomy.  Hospital Course:  He presented to [facility] on 03/24/14 for elective cardiac cath and is now s/p sucessful PCI of the proximal LAD with a DES using adjunctive rotational atherectomy. He was placed on DAPT with ASA/Plavix x 12 months and will continue statin and BB. He will continue with evaluation  by multidisciplinary valve team for consideration of TAVR.  The patient has had an uncomplicated hospital course and is recovering well. The femoral catheter site is stable.  He has been seen by Dr. Ellyn Hack today and deemed ready for discharge home. All follow-up appointments have been scheduled. Discharge medications include Toprol XL 100 mg qd, Zocor 18m qd, ASA/Plavix, and amiodarone 200 mg qd. Warfarin will be held until he is told otherwise at follow up appointment.    Discharge Vitals: Blood pressure 138/89, pulse 80, temperature 97.4 F (36.3 C), temperature source Oral, resp. rate 18, height 5' 10"  (1.778 m), weight 175 lb 11.3 oz (79.7 kg), SpO2 97.00%.  Labs: Lab Results  Component Value Date   WBC 4.6 03/25/2014   HGB 11.7* 03/25/2014   HCT 36.1* 03/25/2014   MCV 96.0 03/25/2014   PLT 129* 03/25/2014     Recent Labs Lab 03/25/14 0430  NA 145  K 4.7  CL 110  CO2 27  BUN 19  CREATININE 0.86  CALCIUM 8.5  GLUCOSE 94    Lab Results  Component Value Date   CHOL 112 12/23/2013   HDL 41.50 12/23/2013   LDLCALC 58 12/23/2013   TRIG 62.0 12/23/2013     Diagnostic Studies/Procedures   CARDIAC CATH NOTE  Name: [name] [name] MRN: [id number] DOB: [birth year] Procedure: IVUS, rotational atherectomy, PTCA and stenting of the proximal LAD  Indication: Severe proximal LAD stenosis. 89-year-old gentleman with known CAD and previous mitral valve replacement/single vessel  CABG has been undergoing evaluation because of progressive exertional dyspnea. He was found to have severe proximal LAD stenosis with an occluded vein graft to the LAD. The patient also has severe aortic stenosis. After careful discussion and extensive evaluation, we have elected to proceed with PCI of the proximal LAD using rotational atherectomy. The patient is also being considered for transcatheter aortic valve replacement. His LAD stenosis requires treatment not only for symptom relief, but also to allow for TAVR  to be performed. The patient has been evaluated by cardiac surgery and he is not a candidate for redo sternotomy.  Procedural Details: The right groin was prepped, draped, and anesthetized with 1% lidocaine. Using the modified Seldinger technique, a 6 Fr sheath was introduced into the right femoral artery. Weight-based bivalirudin was given for anticoagulation. Once a therapeutic ACT was achieved, a 6 [place] CLS-4.0 guide catheter was inserted. Initially, the angiographic appearance of the ostial LAD looked less severe than the previous study. I suspected this was due to better opacification with a guide catheter. I elected to perform IVUS to be certain the lesion was severely diseased. A cougar coronary guidewire was used to cross the lesion. IC NTG was administered. An IVUS probe was advanced across the lesion and a mechanical pullback was performed. This confirmed heavy calcification and very severe stenosis back to the ostium of the LAD. The plaque was occlusive with the IVUS catheter. We then set up for rotational atherectomy. A Rotafloppy wire was advanced across the lesion into the mid LAD. A 1.5 mm burr was platformed outside the body at 165,000 rpm. The verbal was then advanced through the guide catheter and a total of 3 runs were made to treat the lesion. Following rotational atherectomy, The lesion was predilated with a 2.75 mm cutting balloon. The lesion was then stented with a 3.0 x 8 mm Xience Alpine drug-eluting stent. I tried to advance the IVUS catheter for post-procedural IVUS but it would not advance far enough to evaluate stent apposition. The stent was postdilated with a 3.25 mm noncompliant balloon to 16 atm. Following PCI, there was 0% residual stenosis and TIMI-3 flow. Final angiography confirmed an excellent result. The patient tolerated the procedure well. There were no immediate procedural complications. Femoral hemostasis will be achieved with manual pressure. The patient was transferred  to the post catheterization recovery area for further monitoring.  Lesion Data:  Vessel: LAD/proximal (ostial)  Percent stenosis (pre): 80  TIMI-flow (pre): 3  Stent: 3.0x8 mm Xience Alpine DES  Percent stenosis (post): 0  TIMI-flow (post): 3  Conclusions: Successful PCI of the proximal LAD with a DES using adjunctive rotational atherectomy.  Recommendations: DAPT with ASA/Plavix x 12 months, continued evaluation by multidisciplinary valve team for consideration of TAVR.   Discharge Medications     Medication List    STOP taking these medications       warfarin 3 MG tablet  Commonly known as:  COUMADIN      TAKE these medications       amiodarone 200 MG tablet  Commonly known as:  PACERONE  Take 1 tablet (200 mg total) by mouth daily.     aspirin EC 81 MG tablet  Take 81 mg by mouth daily.     clopidogrel 75 MG tablet  Commonly known as:  PLAVIX  Take 1 tablet (75 mg total) by mouth daily.     ferrous sulfate 325 (65 FE) MG tablet  Take 325 mg by mouth daily with breakfast.  methimazole 5 MG tablet  Commonly known as:  TAPAZOLE  Take 5 mg by mouth 3 (three) times a week. On Monday, Wednesday and Friday     metoprolol succinate 100 MG 24 hr tablet  Commonly known as:  TOPROL-XL  Take 100 mg by mouth daily. Take with or immediately following a meal.     multivitamin with minerals Tabs tablet  Take 1 tablet by mouth daily. Centrum Silver     pantoprazole 40 MG tablet  Commonly known as:  PROTONIX  Take 1 tablet (40 mg total) by mouth 2 (two) times daily.     simvastatin 40 MG tablet  Commonly known as:  ZOCOR  Take 1 tablet (40 mg total) by mouth at bedtime.     tamsulosin 0.4 MG Caps capsule  Commonly known as:  FLOMAX  Take 0.4 mg by mouth every evening.        Disposition   The patient will be discharged in stable condition to home.  Follow-up Information   Follow up with [facility] TAVR On 04/04/2014. (See Dr. Burt Knack and Dr. Roxy Manns at the TAVR  clinic at [facility] in the heart and vascular center @ 2:30pm )         Duration of Discharge Encounter: Greater than 30 minutes including physician and PA time.  Signed, Perry Mount PA-C 03/25/2014, 8:54 AM

## 2014-03-25 NOTE — Progress Notes (Signed)
    80 y/o man with h/o MVP & Severe MR, PAF -- s/p robotic MVR (29 mm St. Jude Biocor) @ [facility] & SCG-LAD w/ Cryo-Maze.  Had embolic CVA - full recovery.  S/p PPM for SSS. ~3 month h/o worsening dyspnea on exertion & bending over --> found to have severe calcific prox LAD disease (occluded SVG-LAD) with moderate-severe AS (area ~0.7 cm2, peak gradient ~29 mmHg). For his AS, he has been referred to the [facility] clinic for evaluation for possible TAVR.  In preparation for this procedure, he is first referred for PCI of the LAD (with Rotational Atherectomy).  5/18 - Xience 3.0 mm x8 mm to prox LAD.  Subjective:  Feels fine this AM. No complaints  Objective:  Vital Signs in the last 24 hours: Temp:  [97.4 F (36.3 C)-98.1 F (36.7 C)] 98.1 F (36.7 C) (05/19 0440) Pulse Rate:  [70-80] 72 (05/19 0440) Resp:  [16-20] 20 (05/19 0440) BP: (123-162)/(74-96) 141/86 mmHg (05/19 0440) SpO2:  [95 %-100 %] 95 % (05/19 0440) Weight:  [175 lb 11.3 oz (79.7 kg)] 175 lb 11.3 oz (79.7 kg) (05/19 0026)  Intake/Output from previous day: 05/18 0701 - 05/19 0700 In: 1095.4 [P.O.:480; I.V.:615.4] Out: 1175 [Urine:1175] Intake/Output from this shift:   Physical Exam: General appearance: alert, cooperative, appears stated age, no distress and pleasant. Neck: no adenopathy, no carotid bruit, no JVD and supple, symmetrical, trachea midline Lungs: clear to auscultation bilaterally, normal percussion bilaterally and non-labored Heart: RRR; 3/6 harsh, late-peaking SEM @ RUSB, soft A2 Abdomen: soft, non-tender; bowel sounds normal; no masses,  no organomegaly Extremities: trace to 1+ LE edema, stasis dermatitis changes noted Pulses: 2+ and symmetric  Lab Results:  Recent Labs  03/24/14 0622 03/25/14 0430  WBC 4.4 4.6  HGB 12.3* 11.7*  PLT 126* 129*    Recent Labs  03/24/14 0622 03/25/14 0430  NA 142 145  K 4.2 4.7  CL 105 110  CO2 25 27  GLUCOSE 96 94  BUN 21 19  CREATININE 0.84 0.86    Cardiac Studies: Rotation atherectomy & PCI with DES of LAD - 3.0 mm x 8 mm Xience DES  Assessment/Plan:  Principal Problem:   CAD S/P percutaneous coronary angioplasty - Xience DES 3.0 mm x 8 mm prox LAD Active Problems:   Aortic valvular stenosis   Cardiomyopathy, ischemic  Stable post PCI of LAD -- DAPT with ASA/Plavix x 12 months  Needs to walk to assess groin site stability & Sx. Then OK for d/c.  Will need ROV with Multidisciplinary Valve team for possible TAVR, also sees Dr. [name] as primary cardiologist.   LOS: 1 day    Leonie Man 03/25/2014, 7:15 AM

## 2014-03-25 NOTE — Progress Notes (Signed)
CARDIAC REHAB PHASE I   PRE:  Rate/Rhythm: 71 pacing    BP: sitting 138/89    SaO2:   MODE:  Ambulation: 500 ft   POST:  Rate/Rhythm: 110 dual pacing    BP: sitting 125/97, 147/93    SaO2:   Slightly feeble when initially getting up, improved with distance. Steady rest of walk. No SOB noted, pt feels much better than PTA. Very clear in discussion. Ed completed. [facility]ed in [facility] and will send referral but will need to wait till after TAVR. Rake, ACSM 03/25/2014 8:42 AM

## 2014-03-28 ENCOUNTER — Ambulatory Visit: Payer: Medicare Other | Admitting: Pharmacist Clinician (PhC)/ Clinical Pharmacy Specialist

## 2014-04-04 ENCOUNTER — Ambulatory Visit (HOSPITAL_BASED_OUTPATIENT_CLINIC_OR_DEPARTMENT_OTHER)
Admission: RE | Admit: 2014-04-04 | Discharge: 2014-04-04 | Disposition: A | Discharge status: Home / Self Care | Payer: Medicare Other | Source: Ambulatory Visit | Attending: Thoracic Surgery (Cardiothoracic Vascular Surgery) | Admitting: Thoracic Surgery (Cardiothoracic Vascular Surgery)

## 2014-04-04 ENCOUNTER — Other Ambulatory Visit: Payer: Self-pay

## 2014-04-04 ENCOUNTER — Ambulatory Visit (HOSPITAL_BASED_OUTPATIENT_CLINIC_OR_DEPARTMENT_OTHER)
Admission: RE | Admit: 2014-04-04 | Discharge: 2014-04-04 | Disposition: A | Discharge status: Home / Self Care | Payer: Medicare Other | Source: Ambulatory Visit | Attending: Cardiovascular Disease | Admitting: Cardiovascular Disease

## 2014-04-04 ENCOUNTER — Encounter (HOSPITAL_COMMUNITY): Payer: Self-pay | Admitting: Cardiovascular Disease

## 2014-04-04 ENCOUNTER — Encounter (HOSPITAL_COMMUNITY): Payer: Self-pay | Admitting: Thoracic Surgery (Cardiothoracic Vascular Surgery)

## 2014-04-04 VITALS — BP 132/71 | HR 90 | Resp 19 | Ht 70.0 in | Wt 175.0 lb

## 2014-04-04 VITALS — Wt 175.0 lb

## 2014-04-04 DIAGNOSIS — I2510 Atherosclerotic heart disease of native coronary artery without angina pectoris: Secondary | ICD-10-CM | POA: Diagnosis not present

## 2014-04-04 DIAGNOSIS — D509 Iron deficiency anemia, unspecified: Secondary | ICD-10-CM | POA: Diagnosis not present

## 2014-04-04 DIAGNOSIS — Z85828 Personal history of other malignant neoplasm of skin: Secondary | ICD-10-CM | POA: Diagnosis not present

## 2014-04-04 DIAGNOSIS — Z8669 Personal history of other diseases of the nervous system and sense organs: Secondary | ICD-10-CM | POA: Diagnosis not present

## 2014-04-04 DIAGNOSIS — Z8673 Personal history of transient ischemic attack (TIA), and cerebral infarction without residual deficits: Secondary | ICD-10-CM | POA: Diagnosis not present

## 2014-04-04 DIAGNOSIS — Z9861 Coronary angioplasty status: Secondary | ICD-10-CM | POA: Diagnosis not present

## 2014-04-04 DIAGNOSIS — Z86010 Personal history of colonic polyps: Secondary | ICD-10-CM | POA: Diagnosis not present

## 2014-04-04 DIAGNOSIS — Z8659 Personal history of other mental and behavioral disorders: Secondary | ICD-10-CM | POA: Diagnosis not present

## 2014-04-04 DIAGNOSIS — Z9889 Other specified postprocedural states: Secondary | ICD-10-CM | POA: Diagnosis not present

## 2014-04-04 DIAGNOSIS — Z7902 Long term (current) use of antithrombotics/antiplatelets: Secondary | ICD-10-CM | POA: Diagnosis not present

## 2014-04-04 DIAGNOSIS — Z862 Personal history of diseases of the blood and blood-forming organs and certain disorders involving the immune mechanism: Secondary | ICD-10-CM | POA: Diagnosis not present

## 2014-04-04 DIAGNOSIS — Z953 Presence of xenogenic heart valve: Secondary | ICD-10-CM

## 2014-04-04 DIAGNOSIS — R5383 Other fatigue: Secondary | ICD-10-CM | POA: Diagnosis not present

## 2014-04-04 DIAGNOSIS — Z8546 Personal history of malignant neoplasm of prostate: Secondary | ICD-10-CM | POA: Diagnosis not present

## 2014-04-04 DIAGNOSIS — Z951 Presence of aortocoronary bypass graft: Secondary | ICD-10-CM | POA: Diagnosis not present

## 2014-04-04 DIAGNOSIS — Z87891 Personal history of nicotine dependence: Secondary | ICD-10-CM | POA: Diagnosis not present

## 2014-04-04 DIAGNOSIS — I4891 Unspecified atrial fibrillation: Secondary | ICD-10-CM | POA: Diagnosis not present

## 2014-04-04 DIAGNOSIS — Z79899 Other long term (current) drug therapy: Secondary | ICD-10-CM | POA: Diagnosis not present

## 2014-04-04 DIAGNOSIS — E785 Hyperlipidemia, unspecified: Secondary | ICD-10-CM | POA: Diagnosis not present

## 2014-04-04 DIAGNOSIS — Z952 Presence of prosthetic heart valve: Secondary | ICD-10-CM

## 2014-04-04 DIAGNOSIS — I10 Essential (primary) hypertension: Secondary | ICD-10-CM | POA: Diagnosis not present

## 2014-04-04 DIAGNOSIS — I359 Nonrheumatic aortic valve disorder, unspecified: Secondary | ICD-10-CM

## 2014-04-04 DIAGNOSIS — I350 Nonrheumatic aortic (valve) stenosis: Secondary | ICD-10-CM

## 2014-04-04 DIAGNOSIS — R011 Cardiac murmur, unspecified: Secondary | ICD-10-CM | POA: Diagnosis not present

## 2014-04-04 DIAGNOSIS — K219 Gastro-esophageal reflux disease without esophagitis: Secondary | ICD-10-CM | POA: Diagnosis not present

## 2014-04-04 DIAGNOSIS — Z950 Presence of cardiac pacemaker: Secondary | ICD-10-CM | POA: Diagnosis not present

## 2014-04-04 DIAGNOSIS — R5381 Other malaise: Secondary | ICD-10-CM | POA: Diagnosis not present

## 2014-04-04 DIAGNOSIS — R42 Dizziness and giddiness: Secondary | ICD-10-CM | POA: Diagnosis present

## 2014-04-04 DIAGNOSIS — Z7982 Long term (current) use of aspirin: Secondary | ICD-10-CM | POA: Diagnosis not present

## 2014-04-04 NOTE — Progress Notes (Signed)
MULTIDISCIPLINARY HEART VALVE CLINIC NOTE  Patient ID: [name] [name] MRN: [id number] DOB/AGE: June 06, 1929 80 y.o.  Primary Care Physician:ELLISON, SEAN, MD Primary Cardiologist: Dr [name]  HPI: [name] [name] returns for follow-up as he continues to undergo evaluation for TAVR.   He has a cardiac history dating back to 2009 when he was diagnosed with MVP and associated severe [name] as well as atrial fibrillation. He was noted to have 70% calcified stenosis at the LAD ostium and moderate disease in his other coronary arteries. He underwent robotic mitral valve repair at [facility] with grafting of the LAD using a SVG conduit because the LIMA was inadequate. He underwent Cryoablation and ultimately required mitral valve replacement with a 29 mm St Jude Biocor prosthesis. His post-op course was complicated by an embolic stroke from which he has had a full recovery. He has undergone placement of a permanent pacemaker and is pacer-dependent. He's been maintained on amiodarone for PAF with a very low AF burden and he's been anticoagulated with warfarin until recently. LV function is mildly depressed at 45-50%. Warfarin was recently stopped because he has undergone rotational atherectomy and stenting of the proximal LAD after cardiac cath demonstrated severe proximal and ostial LAD stenosis with an occluded graft to this vessel. He was treated with a DES and is now maintained on DAPT with ASA and clopidogrel.   He's had slight improvement in shortness of breath since his PCI procedure, but continues to be quite limited. Denies chest pain, orthopnea, or PND. No lightheadedness or syncope. Tolerating ASA and plavix without bleeding problems.    Past Medical History  Diagnosis Date  . GASTRIC POLYP 05/13/2009  . COLONIC POLYPS, ADENOMATOUS 01/14/2008  . HYPERLIPIDEMIA 06/01/2007  . ANEMIA, IRON DEFICIENCY 10/13/2008  . COAGULOPATHY, COUMADIN-INDUCED 01/14/2008  . ANXIETY 09/04/2008  . HYPERTENSION 06/01/2007  . ATRIAL  FIBRILLATION, CHRONIC 01/14/2008    on warfarin since 2007  . UNSPECIFIED VENOUS INSUFFICIENCY 09/18/2008  . PLEURAL EFFUSION, LEFT 10/13/2008  . GERD 06/01/2007  . BARRETTS ESOPHAGUS 03/20/2009  . PROSTATE CANCER, HX OF 01/14/2008  . CATARACTS, BILATERAL, HX OF 01/14/2008  . CEREBROVASCULAR ACCIDENT, HX OF 08/18/2008  . PERSONAL HX COLONIC POLYPS 01/01/2010  . BASAL CELL CARCINOMA OF SKIN SITE UNSPECIFIED 09/28/2010  . HYPERTHYROIDISM 01/06/2011  . Hyperglycemia   . Elevated PSA   . Restless leg syndrome   . Mitral regurgitation 06/13/2008    mitral valve replacement #20 St. Jude Biocor; Maze procedure by Dr. Amador Cunas  at [facility]  . CORONARY ARTERY DISEASE 06/01/2007    Cath 04/03/2008; Cath 11/01/1990  . Tachy-brady syndrome 07/13/2008    medtronic Adapta gen. model ADDR01 ,serial # [id number] H by Dr  [name] at [facility]  . Peripheral artery disease 06/16/2010    LEA doppler-left ABI nml 0.61 calcified vessels;right ABI  0.68  . Edema, peripheral 07/30/2009    LEV doppler- no thrombus or thrombophlebitis  . Aortic valvular disorder 08/16/2012    echo EF 45-50% LV mildly reduce,Biocor mitral valve,mild to mod. tricuspid regrug.,mild to mod. aortic regrug.  . Mitral valve disorder 09/08/2011    echo EF 50-55% LV normal  . Aortic valvular stenosis 11/19/2013  . Cardiomyopathy, ischemic 11/19/2013    EF 45-50% with inferior wall hypokinesis by echo 2014  . S/P CABG x 1 06/13/2008    SVG to LAD with open vein harvest right thigh, LIMA harvested but not utilized by Dr Amador Cunas  . S/P mitral valve replacement with bioprosthetic valve 06/13/2008  75m St Jude Biocor porcine bioprosthetic tissue valve by Dr CAmador Cunas . S/P Maze operation for atrial fibrillation 06/13/2008    Partial left atrial lesion set using cryothermy for bilateral pulmonary vein isolation by Dr CAmador Cunas . Prosthetic valve dysfunction     Mitral stenosis involving bioprosthetic tissue valve placed 06/13/2008  . Mitral stenosis      Porcine bioprosthetic tissue valve placed 06/13/2008  . Dysrhythmia     HX OF TACHY BRADY  . Pacemaker     Past Surgical History  Procedure Laterality Date  . Prostatectomy    . Tonsillectomy    . Cystectomy      Left leg  . Pacemaker placement  07/13/2008    [place] [facility]  . Coronary artery bypass graft  06/13/2008    graft x1 w/vein graft [facility]AD [facility] . Mitral valve replacement  06/13/2008    #20 St. Jude Bicor mitral valve ;[facility] . Cardiac catheterization  11/01/1990  . Skin cancer excision      removed from forehead and right leg, nose/face  . Upper gastrointestinal endoscopy  07/14/2009    erosive reflux esopagitis, Barrett's esophagus  . Colonoscopy  07/14/2009    diverticulosis, internal hemorrhoids  . Cardiovascular stress test  06/03/2010    Persantine perfusion EF 45% mild to moderate ischemia basal and mid inferolateral region  . Cardiovascular stress test  01/01/2008    left ventricle normal,no significant ischemia  . Cardiovascular stress test  09/09/2005     possibility of ischemia inferior wall  . Tee without cardioversion N/A 01/22/2014    Procedure: TRANSESOPHAGEAL ECHOCARDIOGRAM (TEE);  Surgeon: MSanda Klein MD;  [facility];  Service: Cardiovascular;  Laterality: N/A;  . Coronary stent placement  03/24/2014    DES to LAD    Family History  Problem Relation Age of Onset  . Cancer Mother     Breast Cancer    History   Social History  . Marital Status: Married    Spouse Name: N/A    Number of Children: 4  . Years of Education: N/A   Occupational History  . Retired    Social History Main Topics  . Smoking status: Former Smoker -- 2 years    Types: Pipe    Quit date: 11/07/1965  . Smokeless tobacco: Never Used     Comment: quit over 40 years ago, never smoked cigarettes  . Alcohol Use: No  . Drug Use: No  . Sexual Activity: Not on file   Other Topics Concern   . Not on file   Social History Narrative  . No narrative on file     Prior to Admission medications   Medication Sig Start Date End Date Taking? Authorizing Provider  amiodarone (PACERONE) 200 MG tablet Take 1 tablet (200 mg total) by mouth daily. 11/04/13  Yes [name] [name], MD  aspirin EC 81 MG tablet Take 81 mg by mouth daily.   Yes Historical Provider, MD  clopidogrel (PLAVIX) 75 MG tablet Take 1 tablet (75 mg total) by mouth daily. 03/14/14 03/14/15 Yes MSherren Mocha MD  ferrous sulfate 325 (65 FE) MG tablet Take 325 mg by mouth daily with breakfast.   Yes Historical Provider, MD  methimazole (TAPAZOLE) 5 MG tablet Take 5 mg by mouth 3 (three) times a week. On Monday, Wednesday and Friday 02/25/14  Yes SRenato Shin MD  metoprolol succinate (TOPROL-XL) 100 MG 24  hr tablet Take 100 mg by mouth daily. Take with or immediately following a meal. 02/05/14  Yes [name], MD  Multiple Vitamin (MULTIVITAMIN WITH MINERALS) TABS Take 1 tablet by mouth daily. Centrum Silver   Yes Historical Provider, MD  pantoprazole (PROTONIX) 40 MG tablet Take 1 tablet (40 mg total) by mouth 2 (two) times daily. 12/26/13  Yes Gatha Mayer, MD  simvastatin (ZOCOR) 40 MG tablet Take 1 tablet (40 mg total) by mouth at bedtime. 05/14/13  Yes [name], MD  Tamsulosin HCl (FLOMAX) 0.4 MG CAPS Take 0.4 mg by mouth every evening.    Yes Historical Provider, MD    No Known Allergies  ROS:  General: no fevers/chills/night sweats Eyes: no blurry vision, diplopia, or amaurosis ENT: no sore throat or hearing loss Resp: no cough, wheezing, or hemoptysis CV: no edema or palpitations GI: no abdominal pain, nausea, vomiting, diarrhea, or constipation GU: no dysuria, frequency, or hematuria Skin: no rash Neuro: no headache, numbness, tingling, or weakness of extremities Musculoskeletal: no joint pain or swelling Heme: no bleeding, DVT, or easy bruising Endo: no polydipsia or polyuria  Wt 175 lb (79.379 kg)  SpO2  %  PHYSICAL EXAM: Pt is alert and oriented, WD, WN, in no distress. HEENT: normal Neck: JVP normal. Carotid upstrokes delayed. No thyromegaly. Lungs: equal expansion, clear bilaterally CV: Apex is discrete and nondisplaced, RRR with grade 3/6 systolic murmur at the LLSB Abd: soft, NT, +BS Back: no CVA tenderness Ext: mild pretibial edema bilaterally Skin: warm and dry without rash Neuro: CNII-XII intact             Strength intact = bilaterally  2-D echocardiogram:  Left ventricle: The cavity size was normal. There was mild concentric hypertrophy. Systolic function was mildly to moderately reduced. The estimated ejection fraction was in the range of 40% to 45%. Images were inadequate for LV wall motion assessment.  ------------------------------------------------------------ Aortic valve: Severely thickened, severely calcified leaflets. Valve mobility was severely restricted. Doppler: Mild regurgitation. Valve area: 0.76cm^2(VTI). Indexed valve area: 0.38cm^2/m^2 (VTI). Peak velocity ratio of LVOT to aortic valve: 0.21. Valve area: 0.74cm^2 (Vmax). Indexed valve area: 0.37cm^2/m^2 (Vmax). Mean gradient: 42m Hg (S). Peak gradient: 443mHg (S).  ------------------------------------------------------------ Mitral valve: A 2926mocor porcine bioprosthesis was present and functioning normally. Gradients are similar to previous studies. Preserved subvalvular apparatus. Gradients are probably normal for this valve. Gradients recorded at a heart rate of 88 bpm. Doppler: No significant regurgitation. Valve area by pressure half-time: 2.68cm^2. Indexed valve area by pressure half-time: 1.35cm^2/m^2. Mean gradient: 5mm45m (D). Peak gradient: 15mm37m(D).  ------------------------------------------------------------ Left atrium: The atrium was severely dilated.  ------------------------------------------------------------ Right ventricle: The cavity size was normal. Wall  thickness was normal. Pacer wire or catheter noted in right ventricle. Systolic function was reduced.  ------------------------------------------------------------ Ventricular septum: Septal motion showed paradox. These changes are consistent with right ventricular pacing.  ------------------------------------------------------------ Pulmonic valve: The valve appears to be grossly normal. Doppler: Trivial regurgitation.  ------------------------------------------------------------ Tricuspid valve: Structurally normal valve. Leaflet separation was normal. Doppler: Transvalvular velocity was within the normal range. Mild regurgitation.  ------------------------------------------------------------ Pulmonary artery: Systolic pressure was mildly increased.  ------------------------------------------------------------ Right atrium: The atrium was mildly dilated. Pacer wire or catheter noted in right atrium.  ------------------------------------------------------------ Pericardium: There was no pericardial effusion.  ------------------------------------------------------------ Systemic veins: Inferior vena cava: The vessel was dilated; the respirophasic diameter changes were blunted (< 50%); findings are consistent with elevated central venous pressure.  gated cardiac CTA:  FINDINGS: Aortic Valve:  Trileaflet and all 3 leaflets heavily calcified especially the non coronary cusp. There is extensive calcification of the sinotubular junction as well as the annulus extending into the  Anterior mitral leaflet  Sinotubular Junction: 3.4 cm  Ascending Thoracic Aorta: 4.5 cm with extensive calcification over the greater curvature  Descending Thoracic Aorta: 2.4 cm tortuous with scattered calcifications  Sinus of Valsalva Measurements:  Non-coronary: 33.4 mm  Right -coronary: 33.7 cm  Left -coronary: 34.8 cm  Coronary Artery Height above Annulus:  Left Main: 12.6  mm  Right Coronary: 13.5 mm  Virtual Basal Annulus Measurements:  Maximum/Minimum Diameter: 29.7/21.4 at 35% phase  Perimeter: 85.4 mm  Area: 570 mm2  Optimum Fluoroscopic Angle for Delivery: LAO 21 degrees Cranial 0 degrees Alternatively AP with 22 degrees Caudal  IMPRESSION: 1) Severely calcified trileaflet aortic valve suitable for 29 mm Sapien XT valve. Unfavorable calcification at the sinotubular junction and annulus which extends through the anterior mitral leaflet  2) Mild aortic root dilatation with severe calcification of the anterior curvature  3) Coronary Artery Heights suitable for TAVR  4) Optimal Angiographic delivery angle LAO 21 degrees Cranial 0, or AP with 22 degrees Caudal angulation  5) Huge Hiatal hernia also involving the transverse colon which would preclude TEE guidance of TAVR procedure  CTA of the abdomen and pelvis:  VASCULAR MEASUREMENTS PERTINENT TO TAVR:  AORTA:  Minimal Aortic Diameter - 16 x 14 mm mm  Severity of Aortic Calcification - Severe  RIGHT PELVIS:  Right Common Iliac Artery -  Minimal Diameter - 10.4 x 10.8 mm  Tortuosity - Moderate  Calcification - Severe  Right External Iliac Artery -  Minimal Diameter - 10.1 x 10.3 mm  Tortuosity - Moderate  Calcification - Mild to moderate  Right Common Femoral Artery -  Minimal Diameter - 7.9 x 6.5 mm  Tortuosity - Mild  Calcification - Severe  LEFT PELVIS:  Left Common Iliac Artery -  Minimal Diameter - 11.4 x 12.1 mm  Tortuosity - Moderate  Calcification - Severe  Left External Iliac Artery -  Minimal Diameter - 7.3 x 12.1 mm  Tortuosity - Mild to moderate  Calcification - Moderate  Left Common Femoral Artery -  Minimal Diameter - 6.6 x 8.0 mm  Tortuosity - Mild  Calcification - Severe  Review of the MIP images confirms the above findings.  IMPRESSION: 1. Findings and measurements pertinent to potential TAVR procedure, as detailed above.  Although the patient does appear to have suitable pelvic arterial access, the patient's common femoral arteries are severely calcified bilaterally (right greater than left). 2. Massive hiatal hernia with predominantly intrathoracic stomach, as well as a large portion of the transverse colon which is intrathoracic located between stomach and the adjacent left atrium. This may have implications for intraprocedural transesophageal echocardiography.  CARDIAC CATH NOTE  03/24/2014 Name: [name] [name]  MRN: [id number]  DOB: 1929/01/26   Procedure: IVUS, rotational atherectomy, PTCA and stenting of the proximal LAD  Indication: Severe proximal LAD stenosis. 68 -year-old gentleman with known CAD and previous mitral valve replacement/single vessel CABG has been undergoing evaluation because of progressive exertional dyspnea. He was found to have severe proximal LAD stenosis with an occluded vein graft to the LAD. The patient also has severe aortic stenosis. After careful discussion and extensive evaluation, we have elected to proceed with PCI of the proximal LAD using rotational atherectomy. The patient is also being considered for transcatheter aortic valve replacement. His LAD stenosis requires treatment not only  for symptom relief, but also to allow for TAVR to be performed. The patient has been evaluated by cardiac surgery and he is not a candidate for redo sternotomy.  Procedural Details: The right groin was prepped, draped, and anesthetized with 1% lidocaine. Using the modified Seldinger technique, a 6 Fr sheath was introduced into the right femoral artery. Weight-based bivalirudin was given for anticoagulation. Once a therapeutic ACT was achieved, a 6 [place] CLS-4.0 guide catheter was inserted. Initially, the angiographic appearance of the ostial LAD looked less severe than the previous study. I suspected this was due to better opacification with a guide catheter. I elected to perform IVUS to be certain  the lesion was severely diseased. A cougar coronary guidewire was used to cross the lesion. IC NTG was administered. An IVUS probe was advanced across the lesion and a mechanical pullback was performed. This confirmed heavy calcification and very severe stenosis back to the ostium of the LAD. The plaque was occlusive with the IVUS catheter. We then set up for rotational atherectomy. A Rotafloppy wire was advanced across the lesion into the mid LAD. A 1.5 mm burr was platformed outside the body at 165,000 rpm. The verbal was then advanced through the guide catheter and a total of 3 runs were made to treat the lesion. Following rotational atherectomy, The lesion was predilated with a 2.75 mm cutting balloon. The lesion was then stented with a 3.0 x 8 mm Xience Alpine drug-eluting stent. I tried to advance the IVUS catheter for post-procedural IVUS but it would not advance far enough to evaluate stent apposition. The stent was postdilated with a 3.25 mm noncompliant balloon to 16 atm. Following PCI, there was 0% residual stenosis and TIMI-3 flow. Final angiography confirmed an excellent result. The patient tolerated the procedure well. There were no immediate procedural complications. Femoral hemostasis will be achieved with manual pressure. The patient was transferred to the post catheterization recovery area for further monitoring.  Lesion Data:  Vessel: LAD/proximal (ostial)  Percent stenosis (pre): 80  TIMI-flow (pre): 3  Stent: 3.0x8 mm Xience Alpine DES  Percent stenosis (post): 0  TIMI-flow (post): 3   Conclusions: Successful PCI of the proximal LAD with a DES using adjunctive rotational atherectomy.  Recommendations: DAPT with ASA/Plavix x 12 months, continued evaluation by multidisciplinary valve team for consideration of TAVR.  [name]  03/24/2014, 9:26 AM   ASSESSMENT AND PLAN:  This is an 80 year-old male with complex coronary and valvular heart disease. He has undergone PCI of the  proximal LAD and his residual CAD Will be treated medically. We plan to further assess the patient's aortic stenosis with a dobutamine echo to evaluate for low-flow AS. His echo studies have been reviewed and he has very severe calcification and restricted mobility of the valve leaflets. With his decreased EF and paced rhythm, I suspect low-gradient severe AS is likely. Pending the results of his dobutamine echo, he will be scheduled for transcatheter AVR for treatment of severe symptomatic AS and NYHA Class 3 CHF. Risks, potential benefits, and alternatives have been reviewed with the patient and his wife at length over several discussions.   The patient has been advised of a variety of potential complications that might develop, including but not limited to death, stroke, MI, paravalvular leak, aortic dissection, vascular injury, bleeding, infection, arrhythmia, heart block or bradycardia, CHF, respiratory failure, renal failure, valve embolization, or other late complications related to structural valve deterioration or migration. He provides full informed consent for the  procedure as described and all questions were answered.   [name] 04/06/2014 10:26 PM

## 2014-04-04 NOTE — Progress Notes (Addendum)
[facility]       CARDIOTHORACIC SURGERY NOTE  Referring Provider is [name], Dani Gobble, MD PCP is [name], MD   HPI:  Patient returns for followup of symptomatic aortic stenosis and coronary artery disease. He was originally seen in consultation on 02/17/2014. He recently underwent percutaneous coronary intervention and stenting of the left anterior descending coronary artery Dr. Burt Knack. This procedure was uncomplicated and he returns to the multidisciplinary heart valve clinic today to discuss treatment options regarding management of aortic stenosis. He denies any symptoms of chest pain or chest tightness either with activity or at rest. He reports stable symptoms of exertional shortness of breath. He has had some dizzy spells without syncope. The remainder of his review of systems is unchanged from previously.   Current Outpatient Prescriptions  Medication Sig Dispense Refill  . amiodarone (PACERONE) 200 MG tablet Take 1 tablet (200 mg total) by mouth daily.  90 tablet  1  . aspirin EC 81 MG tablet Take 81 mg by mouth daily.      . clopidogrel (PLAVIX) 75 MG tablet Take 1 tablet (75 mg total) by mouth daily.  30 tablet  11  . ferrous sulfate 325 (65 FE) MG tablet Take 325 mg by mouth daily with breakfast.      . methimazole (TAPAZOLE) 5 MG tablet Take 5 mg by mouth 3 (three) times a week. On Monday, Wednesday and Friday      . metoprolol succinate (TOPROL-XL) 100 MG 24 hr tablet Take 100 mg by mouth daily. Take with or immediately following a meal.      . Multiple Vitamin (MULTIVITAMIN WITH MINERALS) TABS Take 1 tablet by mouth daily. Centrum Silver      . pantoprazole (PROTONIX) 40 MG tablet Take 1 tablet (40 mg total) by mouth 2 (two) times daily.  180 tablet  3  . simvastatin (ZOCOR) 40 MG tablet Take 1 tablet (40 mg total) by mouth at bedtime.  90 tablet  2  . Tamsulosin HCl (FLOMAX) 0.4 MG CAPS Take 0.4 mg by mouth every  evening.        No current facility-administered medications for this encounter.      Physical Exam:   BP 132/71  Pulse 90  Resp 19  Ht 5' 10"  (1.778 m)  Wt 175 lb (79.379 kg)  BMI 25.11 kg/m2  SpO2 97%  General:  Frail but well-appearing  Chest:   Clear to auscultation  CV:   Regular rate and rhythm with prominent systolic murmur  Incisions:  n/a  Abdomen:  Soft  Extremities:  Warm  Diagnostic Tests:  Cardiac TAVR CT   TECHNIQUE: The patient was scanned on a Philips 256 scanner. A 120 kV retrospective scan was triggered in the descending thoracic aorta at 111 HU's. Gantry rotation speed was 270 msecs and collimation was .9 mm. No beta blockade or nitro were given. The 3D data set was reconstructed in 5% intervals of the R-R cycle. Systolic and diastolic phases were analyzed on a dedicated work station using MPR, MIP and VRT modes. The patient received 80 cc of contrast.   FINDINGS: Aortic Valve: Trileaflet and all 3 leaflets heavily calcified especially the non coronary cusp. There is extensive calcification of the sinotubular junction as well as the annulus extending into the   Anterior mitral leaflet   Sinotubular Junction:  3.4 cm   Ascending Thoracic Aorta: 4.5 cm with extensive calcification over the greater curvature  Descending Thoracic Aorta: 2.4 cm tortuous with scattered calcifications   Sinus of Valsalva Measurements:   Non-coronary:  33.4 mm   Right -coronary:  33.7 cm   Left -coronary:  34.8 cm   Coronary Artery Height above Annulus:   Left Main:  12.6 mm   Right Coronary:  13.5 mm   Virtual Basal Annulus Measurements:   Maximum/Minimum Diameter:  29.7/21.4 at 35% phase   Perimeter:  85.4 mm   Area:  570 mm2   Optimum Fluoroscopic Angle for Delivery: LAO 21 degrees Cranial 0 degrees Alternatively AP with 22 degrees Caudal   IMPRESSION: 1) Severely calcified trileaflet aortic valve suitable for 29 mm Sapien XT valve.  Unfavorable calcification at the sinotubular junction and annulus which extends through the anterior mitral leaflet   2) Mild aortic root dilatation with severe calcification of the anterior curvature   3) Coronary Artery Heights suitable for TAVR   4) Optimal Angiographic delivery angle LAO 21 degrees Cranial 0, or AP with 22 degrees Caudal angulation   5) Huge Hiatal hernia also involving the transverse colon which would preclude TEE guidance of TAVR procedure   Jenkins Rouge     Electronically Signed   By: Jenkins Rouge M.D.   On: 02/21/2014 17:36       Study Result    EXAM: OVER-READ INTERPRETATION  CT CHEST   The following report is an over-read performed by radiologist Dr. Rebekah [facility]field [facility] on 02/21/2014. This over-read does not include interpretation of cardiac or coronary anatomy or pathology. The coronary calcium score/coronary CTA interpretation by the cardiologist is attached.   COMPARISON:  CTA of the chest, abdomen and pelvis 02/21/2014.   FINDINGS: No significant change from contemporaneously obtained CTA of the chest, abdomen and pelvis from 02/21/2014.   IMPRESSION: 1. Please see report for contemporaneously performed CTA of the chest, abdomen and pelvis for full description of extracardiac findings. The most relevant finding for potential TAVR procedure is the patient's massive hiatal hernia which contains both the stomach (which is predominantly intrathoracic), as well as nearly the entire transverse colon. Notably, the transverse colon is interposed between the stomach and the left atrium which may have implications for potential intraprocedural transesophageal echocardiography at time of TAVR procedure.   Electronically Signed: By: Vinnie Langton M.D. On: 02/21/2014 16:07      CT ANGIOGRAPHY CHEST, ABDOMEN AND PELVIS   TECHNIQUE: Multidetector CT imaging through the chest, abdomen and pelvis was performed using  the standard protocol during bolus administration of intravenous contrast. Multiplanar reconstructed images and MIPs were obtained and reviewed to evaluate the vascular anatomy.   CONTRAST:  70 mL of Omnipaque 350.   COMPARISON:  CT of the abdomen and pelvis 11/29/2011. CT of the chest 07/12/2004.   FINDINGS: CT CHEST FINDINGS   Mediastinum: Heart size is mildly enlarged. There is no significant pericardial fluid, thickening or pericardial calcification. There is atherosclerosis of the thoracic aorta, the great vessels of the mediastinum and the coronary arteries, including calcified atherosclerotic plaque in the left main, left anterior descending, left circumflex and right coronary arteries. Extensive calcifications of the aortic valve and mitral annulus. 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 No pathologically enlarged mediastinal or hilar lymph nodes. Massive hiatal hernia with predominantly intrathoracic stomach, as well as a long segment of the transverse colon which is completely intra thoracic.   Lungs/Pleura: Small bilateral pleural effusions (right greater than left) layering dependently. These are simple in appearance. Some  associated passive atelectasis in the lower lobes of the lungs bilaterally (left greater than right). Focus of atelectasis and/or scarring in the periphery of the inferior segment of the lingula. No acute consolidative airspace disease. No definite suspicious appearing pulmonary nodules or masses.   Musculoskeletal: Median sternotomy wires. There are no aggressive appearing lytic or blastic lesions noted in the visualized portions of the skeleton. Multiple old healed anterior left-sided rib fractures.   CT ABDOMEN AND PELVIS FINDINGS   Abdomen/Pelvis: The liver has a slightly shrunken and nodular appearance, suggestive of early cirrhosis. Numerous small calcified gallstones are noted lying dependently in the  gallbladder. No current findings to suggest acute cholecystitis at this time. The appearance of the pancreas, spleen and bilateral adrenal glands is unremarkable. Multiple sub cm low-attenuation lesions are noted throughout the kidneys bilaterally, too small to definitively characterize, but favored to represent small cysts. Several larger well-defined low-intermediate attenuation lesions in the left kidney are compatible with a combination of simple and mildly proteinaceous cysts, with the largest lesion measuring up to 1.9 x 3.1 cm in the left renal sinus.   Trace volume of ascites. Extensive colonic diverticulosis without findings to suggest acute diverticulitis at this time. Nearly the entire transverse colon is intrathoracic within the patient's large hiatal hernia. Normal appendix. No pneumoperitoneum. No pathologic distention of small bowel. Small bilateral inguinal hernias. The left inguinal hernia contains predominantly fat, while the right inguinal hernia contains a short loop of distal small bowel without evidence of bowel incarceration or obstruction at this time. Prostate gland appears enlarged and heterogeneous. Multiple tiny bladder wall diverticulae are incidentally noted.   Musculoskeletal: Multilevel degenerative disc disease in the lumbar spine with multiple Schmorl's nodes. There are no aggressive appearing lytic or blastic lesions noted in the visualized portions of the skeleton.   VASCULAR MEASUREMENTS PERTINENT TO TAVR:   AORTA:   Minimal Aortic Diameter -  16 x 14 mm mm   Severity of Aortic Calcification -  Severe   RIGHT PELVIS:   Right Common Iliac Artery -   Minimal Diameter - 10.4 x 10.8 mm   Tortuosity - Moderate   Calcification - Severe   Right External Iliac Artery -   Minimal Diameter - 10.1 x 10.3 mm   Tortuosity - Moderate   Calcification - Mild to moderate   Right Common Femoral Artery -   Minimal Diameter - 7.9 x 6.5 mm     Tortuosity - Mild   Calcification - Severe   LEFT PELVIS:   Left Common Iliac Artery -   Minimal Diameter - 11.4 x 12.1 mm   Tortuosity - Moderate   Calcification - Severe   Left External Iliac Artery -   Minimal Diameter - 7.3 x 12.1 mm   Tortuosity - Mild to moderate   Calcification - Moderate   Left Common Femoral Artery -   Minimal Diameter - 6.6 x 8.0 mm   Tortuosity - Mild   Calcification - Severe   Review of the MIP images confirms the above findings.   IMPRESSION: 1. Findings and measurements pertinent to potential TAVR procedure, as detailed above. Although the patient does appear to have suitable pelvic arterial access, the patient's common femoral arteries are severely calcified bilaterally (right greater than left). 2. Massive hiatal hernia with predominantly intrathoracic stomach, as well as a large portion of the transverse colon which is intrathoracic located between stomach and the adjacent left atrium. This may have implications for intraprocedural transesophageal echocardiography. 3. Cholelithiasis  without evidence to suggest acute cholecystitis at this time. 4. Evidence of early cirrhosis. 5. Colonic diverticulosis without findings to suggest acute diverticulitis at this time. 6. Small right inguinal hernia containing a short segment of distal small bowel, without evidence of incarceration or obstruction at this time. 7. Additional incidental findings, as above.     Electronically Signed   By: Vinnie Langton M.D.   On: 02/21/2014 15:23      Transthoracic Echocardiography  Patient:    [name] [name], [name] [name] MR #:       [id number] Study Date: 03/14/2014 Gender:     M Age:        80 Height:     177.8cm Weight:     80.7kg BSA:        1.59m2 Pt. Status: Room:    OAndrena MewsMD  [place] Sean Alfred  SONOGRAPHER  LMauricio Po RDCS, CCT  PERFORMING   Chmg,  Outpatient cc:  ------------------------------------------------------------ LV EF: 40% -   45%  ------------------------------------------------------------ Indications:      Aortic stenosis 424.1.  ------------------------------------------------------------ History:   Risk factors:  Mitral valve replacement.  ------------------------------------------------------------ Study Conclusions  - Left ventricle: The cavity size was normal. There was mild   concentric hypertrophy. Systolic function was mildly to   moderately reduced. The estimated ejection fraction was in   the range of 40% to 45%. - Ventricular septum: Septal motion showed paradox. These   changes are consistent with right ventricular pacing. - Aortic valve: Valve mobility was severely restricted. Mild   regurgitation. Valve area: 0.76cm^2(VTI). Valve area:   0.74cm^2 (Vmax). - Mitral valve: A 274miocor porcine bioprosthesis was   present and functioning normally. - Left atrium: The atrium was severely dilated. - Right ventricle: Systolic function was reduced. - Right atrium: The atrium was mildly dilated. - Pulmonary arteries: Systolic pressure was mildly   increased. PA peak pressure: 4530mg (S).  ------------------------------------------------------------ Labs, prior tests, procedures, and surgery: Permanent pacemaker system implantation.  Transthoracic echocardiography.  M-mode, complete 2D, spectral Doppler, and color Doppler.  Height:  Height: 177.8cm. Height: 70in.  Weight:  Weight: 80.7kg. Weight: 177.6lb.  Body mass index:  BMI: 25.5kg/m^2.  Body surface area:    BSA: 1.42m8m Blood pressure:     165/128. Patient status:  Outpatient.  Location:  Echo laboratory.   ------------------------------------------------------------  ------------------------------------------------------------ Left ventricle:  The cavity size was normal. There was mild concentric hypertrophy. Systolic function was  mildly to moderately reduced. The estimated ejection fraction was in the range of 40% to 45%. Images were inadequate for LV wall motion assessment.  ------------------------------------------------------------ Aortic valve:   Severely thickened, severely calcified leaflets. Valve mobility was severely restricted.  Doppler:  Mild regurgitation.    Valve area: 0.76cm^2(VTI). Indexed valve area: 0.38cm^2/m^2 (VTI). Peak velocity ratio of LVOT to aortic valve: 0.21. Valve area: 0.74cm^2 (Vmax). Indexed valve area: 0.37cm^2/m^2 (Vmax).    Mean gradient: 25mm65m(S). Peak gradient: 41mm 19mS).  ------------------------------------------------------------ Mitral valve:  A 29mmBi87m porcine bioprosthesis was present and functioning normally. Gradients are similar to previous studies. Preserved subvalvular apparatus. Gradients are probably normal for this valve. Gradients recorded at a heart rate of 88 bpm.  Doppler:   No significant regurgitation.    Valve area by pressure half-time: 2.68cm^2. Indexed valve area by pressure half-time: 1.35cm^2/m^2.    Mean gradient: 5mm Hg 68m. Peak gradient: 15mm Hg 70m  ------------------------------------------------------------ Left atrium:  The atrium was  severely dilated.  ------------------------------------------------------------ Right ventricle:  The cavity size was normal. Wall thickness was normal. Pacer wire or catheter noted in right ventricle. Systolic function was reduced.  ------------------------------------------------------------ Ventricular septum:   Septal motion showed paradox. These changes are consistent with right ventricular pacing.  ------------------------------------------------------------ Pulmonic valve:    The valve appears to be grossly normal.  Doppler:   Trivial regurgitation.  ------------------------------------------------------------ Tricuspid valve:   Structurally normal valve.   Leaflet separation was  normal.  Doppler:  Transvalvular velocity was within the normal range.  Mild regurgitation.  ------------------------------------------------------------ Pulmonary artery:   Systolic pressure was mildly increased.   ------------------------------------------------------------ Right atrium:  The atrium was mildly dilated. Pacer wire or catheter noted in right atrium.  ------------------------------------------------------------ Pericardium:  There was no pericardial effusion.  ------------------------------------------------------------ Systemic veins: Inferior vena cava: The vessel was dilated; the respirophasic diameter changes were blunted (< 50%); findings are consistent with elevated central venous pressure.  ------------------------------------------------------------  2D measurements        Normal  Doppler measurements   Normal Left ventricle                 Main pulmonary LVID ED,   48.3 mm     43-52   artery chord,                         Pressure,    45 mm Hg  =30 PLAX                           S LVID ES,   37.2 mm     23-38   Pressure     12 mm Hg  ------ chord,                         ED PLAX                           LVOT FS, chord,   23 %      >29     Peak vel,    66 cm/s   ------ PLAX                           S LVPW, ED   13.5 mm     ------  Aortic valve IVS/LVPW   0.92        <1.3    Peak vel,   319 cm/s   ------ ratio, ED                      S Ventricular septum             Mean vel,   228 cm/s   ------ IVS, ED    12.4 mm     ------  S LVOT                           VTI, S     84.7 cm     ------ Diam, S    21.3 mm     ------  Mean         25 mm Hg  ------ Area       3.56 cm^2   ------  gradient, Aorta  S Root diam,   31 mm     ------  Peak         41 mm Hg  ------ ED                             gradient, Left atrium                    S AP dim       51 mm     ------  Area, VTI  0.76 cm^2   ------ AP dim     2.56 cm/m^2 <2.2     Area index 0.38 cm^2/m ------ index                          (VTI)           ^2                                Peak vel   0.21        ------                                ratio,                                LVOT/AV                                Area, Vmax 0.74 cm^2   ------                                Area index 0.37 cm^2/m ------                                (Vmax)          ^2                                Mitral valve                                Peak E vel  217 cm/s   ------                                Peak A vel 79.9 cm/s   ------                                Mean vel,  95.4 cm/s   ------                                D                                Decelerati  243  ms     150-23                                on time                0                                Pressure     82 ms     ------                                half-time                                Mean          5 mm Hg  ------                                gradient,                                D                                Peak         15 mm Hg  ------                                gradient,                                D                                Peak E/A    2.7        ------                                ratio                                Area (PHT) 2.68 cm^2   ------                                Area index 1.35 cm^2/m ------                                (PHT)           ^2                                Annulus    55.3 cm     ------  VTI                                Tricuspid valve                                Regurg      324 cm/s   ------                                peak vel                                Peak RV-RA   42 mm Hg  ------                                gradient,                                S                                Max regurg  324 cm/s   ------                                vel                                 Systemic veins                                Estimated     3 mm Hg  ------                                CVP                                Right ventricle                                Pressure,    45 mm Hg  <30                                S                                Pulmonic valve                                Regurg      153 cm/s   ------  vel, ED   ------------------------------------------------------------ Prepared and Electronically Authenticated by  [employer], [name] [id number]    CARDIAC CATH NOTE  Name: [name] [name]  MRN: [id number]  DOB: 1929/02/04  Procedure: IVUS, rotational atherectomy, PTCA and stenting of the proximal LAD  Indication: Severe proximal LAD stenosis. 52 -year-old gentleman with known CAD and previous mitral valve replacement/single vessel CABG has been undergoing evaluation because of progressive exertional dyspnea. He was found to have severe proximal LAD stenosis with an occluded vein graft to the LAD. The patient also has severe aortic stenosis. After careful discussion and extensive evaluation, we have elected to proceed with PCI of the proximal LAD using rotational atherectomy. The patient is also being considered for transcatheter aortic valve replacement. His LAD stenosis requires treatment not only for symptom relief, but also to allow for TAVR to be performed. The patient has been evaluated by cardiac surgery and he is not a candidate for redo sternotomy.  Procedural Details: The right groin was prepped, draped, and anesthetized with 1% lidocaine. Using the modified Seldinger technique, a 6 Fr sheath was introduced into the right femoral artery. Weight-based bivalirudin was given for anticoagulation. Once a therapeutic ACT was achieved, a 6 [place] CLS-4.0 guide catheter was inserted. Initially, the angiographic appearance of the ostial LAD looked less severe than the previous study. I suspected this  was due to better opacification with a guide catheter. I elected to perform IVUS to be certain the lesion was severely diseased. A cougar coronary guidewire was used to cross the lesion. IC NTG was administered. An IVUS probe was advanced across the lesion and a mechanical pullback was performed. This confirmed heavy calcification and very severe stenosis back to the ostium of the LAD. The plaque was occlusive with the IVUS catheter. We then set up for rotational atherectomy. A Rotafloppy wire was advanced across the lesion into the mid LAD. A 1.5 mm burr was platformed outside the body at 165,000 rpm. The verbal was then advanced through the guide catheter and a total of 3 runs were made to treat the lesion. Following rotational atherectomy, The lesion was predilated with a 2.75 mm cutting balloon. The lesion was then stented with a 3.0 x 8 mm Xience Alpine drug-eluting stent. I tried to advance the IVUS catheter for post-procedural IVUS but it would not advance far enough to evaluate stent apposition. The stent was postdilated with a 3.25 mm noncompliant balloon to 16 atm. Following PCI, there was 0% residual stenosis and TIMI-3 flow. Final angiography confirmed an excellent result. The patient tolerated the procedure well. There were no immediate procedural complications. Femoral hemostasis will be achieved with manual pressure. The patient was transferred to the post catheterization recovery area for further monitoring.  Lesion Data:  Vessel: LAD/proximal (ostial)  Percent stenosis (pre): 80  TIMI-flow (pre): 3  Stent: 3.0x8 mm Xience Alpine DES  Percent stenosis (post): 0  TIMI-flow (post): 3  Conclusions: Successful PCI of the proximal LAD with a DES using adjunctive rotational atherectomy.  Recommendations: DAPT with ASA/Plavix x 12 months, continued evaluation by multidisciplinary valve team for consideration of TAVR.  [name]  03/24/2014, 9:26 AM     Impression:  Patient presents with  progressive symptoms of exertional shortness of breath consistent with chronic diastolic congestive heart failure, [place] Heart Association functional class III. Previous diagnostic tests demonstrated the presence of at least moderate aortic stenosis with severe two-vessel coronary artery disease and mild to moderate left ventricular dysfunction. The patient recently underwent PCI  and stenting for high-grade stenosis of the left anterior descending coronary artery. This procedure was tolerated well and the patient remains clinically stable at this time.  He continues to experience symptoms of exertional shortness of breath.  Although previous echocardiograms have demonstrated mean transvalvular gradients across the aortic valve less than 40 mm mercury, I am convinced the patient likely has severe aortic stenosis with relatively low flow. Dobutamine echocardiogram may be helpful to definitively establish the presence of severe aortic stenosis.  Assuming this is the case, I feel the patient might benefit from transcatheter aortic valve replacement. For reasons outlined previously, he would not be considered a candidate for conventional surgical aortic valve replacement under any circumstances.  Based upon review the patient's cardiac gated CT angiogram of the heart and CT angiogram of the chest abdomen and pelvis, the patient appears to be an acceptable candidate for transcatheter aortic valve replacement using the open right transfemoral approach. He does have significant calcification and disease involving the infrarenal aorta and iliac vessels, but the arteries appear large enough to facilitate a transfemoral approach, particularly on the right side. The trans-apical approach could be considered an alternative if sheath placement via the transfemoral approach proved to be problematic. Imaging at the time of the procedure will be challenging because of the patient's extremely large hiatal hernia which has proven  to render transesophageal echocardiogram essentially useless. Transthoracic echocardiography will likely be required at the time of surgery.    Plan:  The patient and his family were counseled at length regarding treatment alternatives for management of severe symptomatic aortic stenosis. Alternative approaches such as conventional aortic valve replacement, transcatheter aortic valve replacement, and palliative medical therapy were compared and contrasted at length.   Long-term prognosis with medical therapy was discussed. This discussion was placed in the context of the patient's own specific clinical presentation and past medical history.  We plan to proceed with dobutamine echocardiography in the near future to confirm the functional severity of disease.  Following the decision to proceed with transcatheter aortic valve replacement, a discussion has been held regarding what types of management strategies would be attempted intraoperatively in the event of life-threatening complications, including the fact that the patient would not be considered a candidate for conversion to open sternotomy for attempted surgical intervention.  The patient has been advised of a variety of complications that might develop including but not limited to risks of death, stroke, paravalvular leak, aortic dissection or other major vascular complications, aortic annulus rupture, device embolization, cardiac rupture or perforation, mitral regurgitation, acute myocardial infarction, arrhythmia, heart block or bradycardia requiring permanent pacemaker placement, congestive heart failure, respiratory failure, renal failure, pneumonia, infection, other late complications related to structural valve deterioration or migration, or other complications that might ultimately cause a temporary or permanent loss of functional independence or other long term morbidity.  The patient provides full informed consent for the procedure as described  and all questions were answered.    Valentina Gu. Roxy Manns, MD 04/04/2014 4:15 PM

## 2014-04-05 ENCOUNTER — Telehealth: Payer: Self-pay | Admitting: Physician Assistant

## 2014-04-05 ENCOUNTER — Emergency Department (HOSPITAL_COMMUNITY): Payer: Medicare Other

## 2014-04-05 ENCOUNTER — Observation Stay (HOSPITAL_COMMUNITY)
Admission: EM | Admit: 2014-04-05 | Discharge: 2014-04-06 | Disposition: A | Discharge status: Home / Self Care | Payer: Medicare Other | Attending: Internal Medicine | Admitting: Internal Medicine

## 2014-04-05 ENCOUNTER — Encounter (HOSPITAL_COMMUNITY): Payer: Self-pay | Admitting: Emergency Medicine

## 2014-04-05 DIAGNOSIS — Z8669 Personal history of other diseases of the nervous system and sense organs: Secondary | ICD-10-CM | POA: Insufficient documentation

## 2014-04-05 DIAGNOSIS — K219 Gastro-esophageal reflux disease without esophagitis: Secondary | ICD-10-CM | POA: Insufficient documentation

## 2014-04-05 DIAGNOSIS — R5383 Other fatigue: Secondary | ICD-10-CM

## 2014-04-05 DIAGNOSIS — Z950 Presence of cardiac pacemaker: Secondary | ICD-10-CM | POA: Insufficient documentation

## 2014-04-05 DIAGNOSIS — Z860100 Personal history of colon polyps, unspecified: Secondary | ICD-10-CM | POA: Insufficient documentation

## 2014-04-05 DIAGNOSIS — I2510 Atherosclerotic heart disease of native coronary artery without angina pectoris: Secondary | ICD-10-CM

## 2014-04-05 DIAGNOSIS — D509 Iron deficiency anemia, unspecified: Secondary | ICD-10-CM | POA: Insufficient documentation

## 2014-04-05 DIAGNOSIS — I4891 Unspecified atrial fibrillation: Secondary | ICD-10-CM | POA: Insufficient documentation

## 2014-04-05 DIAGNOSIS — Z9889 Other specified postprocedural states: Secondary | ICD-10-CM | POA: Insufficient documentation

## 2014-04-05 DIAGNOSIS — Z79899 Other long term (current) drug therapy: Secondary | ICD-10-CM | POA: Insufficient documentation

## 2014-04-05 DIAGNOSIS — Z9861 Coronary angioplasty status: Secondary | ICD-10-CM | POA: Insufficient documentation

## 2014-04-05 DIAGNOSIS — Z8659 Personal history of other mental and behavioral disorders: Secondary | ICD-10-CM | POA: Insufficient documentation

## 2014-04-05 DIAGNOSIS — Z951 Presence of aortocoronary bypass graft: Secondary | ICD-10-CM | POA: Insufficient documentation

## 2014-04-05 DIAGNOSIS — Z85828 Personal history of other malignant neoplasm of skin: Secondary | ICD-10-CM | POA: Insufficient documentation

## 2014-04-05 DIAGNOSIS — Z862 Personal history of diseases of the blood and blood-forming organs and certain disorders involving the immune mechanism: Secondary | ICD-10-CM | POA: Insufficient documentation

## 2014-04-05 DIAGNOSIS — I10 Essential (primary) hypertension: Secondary | ICD-10-CM | POA: Diagnosis present

## 2014-04-05 DIAGNOSIS — Z8673 Personal history of transient ischemic attack (TIA), and cerebral infarction without residual deficits: Secondary | ICD-10-CM | POA: Insufficient documentation

## 2014-04-05 DIAGNOSIS — R5381 Other malaise: Secondary | ICD-10-CM | POA: Insufficient documentation

## 2014-04-05 DIAGNOSIS — E785 Hyperlipidemia, unspecified: Secondary | ICD-10-CM | POA: Diagnosis present

## 2014-04-05 DIAGNOSIS — R42 Dizziness and giddiness: Principal | ICD-10-CM | POA: Diagnosis present

## 2014-04-05 DIAGNOSIS — Z8546 Personal history of malignant neoplasm of prostate: Secondary | ICD-10-CM

## 2014-04-05 DIAGNOSIS — I495 Sick sinus syndrome: Secondary | ICD-10-CM | POA: Diagnosis present

## 2014-04-05 DIAGNOSIS — Z87891 Personal history of nicotine dependence: Secondary | ICD-10-CM | POA: Insufficient documentation

## 2014-04-05 DIAGNOSIS — Z7982 Long term (current) use of aspirin: Secondary | ICD-10-CM | POA: Insufficient documentation

## 2014-04-05 DIAGNOSIS — R011 Cardiac murmur, unspecified: Secondary | ICD-10-CM | POA: Insufficient documentation

## 2014-04-05 DIAGNOSIS — Z7902 Long term (current) use of antithrombotics/antiplatelets: Secondary | ICD-10-CM | POA: Insufficient documentation

## 2014-04-05 DIAGNOSIS — K2270 Barrett's esophagus without dysplasia: Secondary | ICD-10-CM | POA: Diagnosis present

## 2014-04-05 LAB — CBC WITH DIFFERENTIAL/PLATELET
Basophils Absolute: 0 10*3/uL (ref 0.0–0.1)
Basophils Relative: 1 % (ref 0–1)
Eosinophils Absolute: 0.1 10*3/uL (ref 0.0–0.7)
Eosinophils Relative: 3 % (ref 0–5)
HEMATOCRIT: 38.5 % — AB (ref 39.0–52.0)
HEMOGLOBIN: 12 g/dL — AB (ref 13.0–17.0)
Lymphocytes Relative: 24 % (ref 12–46)
Lymphs Abs: 1 10*3/uL (ref 0.7–4.0)
MCH: 30.2 pg (ref 26.0–34.0)
MCHC: 31.2 g/dL (ref 30.0–36.0)
MCV: 97 fL (ref 78.0–100.0)
MONO ABS: 0.4 10*3/uL (ref 0.1–1.0)
MONOS PCT: 9 % (ref 3–12)
NEUTROS ABS: 2.8 10*3/uL (ref 1.7–7.7)
Neutrophils Relative %: 63 % (ref 43–77)
Platelets: 136 10*3/uL — ABNORMAL LOW (ref 150–400)
RBC: 3.97 MIL/uL — ABNORMAL LOW (ref 4.22–5.81)
RDW: 14.1 % (ref 11.5–15.5)
WBC: 4.3 10*3/uL (ref 4.0–10.5)

## 2014-04-05 LAB — COMPREHENSIVE METABOLIC PANEL
ALT: 16 U/L (ref 0–53)
AST: 17 U/L (ref 0–37)
Albumin: 3.5 g/dL (ref 3.5–5.2)
Alkaline Phosphatase: 86 U/L (ref 39–117)
BILIRUBIN TOTAL: 0.3 mg/dL (ref 0.3–1.2)
BUN: 25 mg/dL — AB (ref 6–23)
CHLORIDE: 110 meq/L (ref 96–112)
CO2: 27 mEq/L (ref 19–32)
Calcium: 8.8 mg/dL (ref 8.4–10.5)
Creatinine, Ser: 0.85 mg/dL (ref 0.50–1.35)
GFR calc Af Amer: >90 mL/min (ref >90)
GFR calc non Af Amer: 78 mL/min — ABNORMAL LOW (ref >90)
Glucose, Bld: 86 mg/dL (ref 70–99)
Potassium: 4.2 mEq/L (ref 3.7–5.3)
Sodium: 142 mEq/L (ref 137–147)
Total Protein: 6.6 g/dL (ref 6.0–8.3)

## 2014-04-05 LAB — URINALYSIS, ROUTINE W REFLEX MICROSCOPIC
Bilirubin Urine: NEGATIVE
Glucose, UA: NEGATIVE mg/dL
HGB URINE DIPSTICK: NEGATIVE
Ketones, ur: NEGATIVE mg/dL
Nitrite: NEGATIVE
Protein, ur: NEGATIVE mg/dL
Specific Gravity, Urine: 1.028 (ref 1.005–1.030)
Urobilinogen, UA: 1 mg/dL (ref 0.0–1.0)
pH: 6 (ref 5.0–8.0)

## 2014-04-05 LAB — URINE MICROSCOPIC-ADD ON

## 2014-04-05 LAB — MAGNESIUM: MAGNESIUM: 2 mg/dL (ref 1.5–2.5)

## 2014-04-05 LAB — TSH: TSH: 3.86 u[IU]/mL (ref 0.350–4.500)

## 2014-04-05 LAB — CBG MONITORING, ED: Glucose-Capillary: 91 mg/dL (ref 70–99)

## 2014-04-05 LAB — PRO B NATRIURETIC PEPTIDE: Pro B Natriuretic peptide (BNP): 3725 pg/mL — ABNORMAL HIGH (ref 0–450)

## 2014-04-05 LAB — TROPONIN I: Troponin I: <0.3 ng/mL (ref <0.30)

## 2014-04-05 MED ORDER — SODIUM CHLORIDE 0.9 % IJ SOLN: 3.0000 mL | INTRAMUSCULAR | Status: DC | PRN

## 2014-04-05 MED ORDER — SODIUM CHLORIDE 0.9 % IV SOLN
1000.0000 mL | INTRAVENOUS | Status: DC
  Administered 2014-04-05: 1000 mL via INTRAVENOUS

## 2014-04-05 MED ORDER — TAMSULOSIN HCL 0.4 MG PO CAPS
0.4000 mg | ORAL_CAPSULE | Freq: Every evening | ORAL | Status: DC
  Administered 2014-04-05: 0.4 mg via ORAL
  Filled 2014-04-05 (×2): qty 1

## 2014-04-05 MED ORDER — FUROSEMIDE 10 MG/ML IJ SOLN
40.0000 mg | Freq: Once | INTRAMUSCULAR | Status: AC
  Administered 2014-04-05: 40 mg via INTRAVENOUS
  Filled 2014-04-05: qty 4

## 2014-04-05 MED ORDER — FERROUS SULFATE 325 (65 FE) MG PO TABS
325.0000 mg | ORAL_TABLET | Freq: Every day | ORAL | Status: DC
  Administered 2014-04-06: 325 mg via ORAL
  Filled 2014-04-05 (×2): qty 1

## 2014-04-05 MED ORDER — SIMVASTATIN 40 MG PO TABS
40.0000 mg | ORAL_TABLET | Freq: Every day | ORAL | Status: DC
  Administered 2014-04-05: 40 mg via ORAL
  Filled 2014-04-05 (×2): qty 1

## 2014-04-05 MED ORDER — ONDANSETRON HCL 4 MG/2ML IJ SOLN
4.0000 mg | Freq: Once | INTRAMUSCULAR | Status: AC
  Administered 2014-04-05: 4 mg via INTRAVENOUS
  Filled 2014-04-05: qty 2

## 2014-04-05 MED ORDER — ADULT MULTIVITAMIN W/MINERALS CH
1.0000 | ORAL_TABLET | Freq: Every day | ORAL | Status: DC
  Administered 2014-04-06: 1 via ORAL
  Filled 2014-04-05: qty 1

## 2014-04-05 MED ORDER — PANTOPRAZOLE SODIUM 40 MG PO TBEC
40.0000 mg | DELAYED_RELEASE_TABLET | Freq: Two times a day (BID) | ORAL | Status: DC
  Administered 2014-04-05 – 2014-04-06 (×2): 40 mg via ORAL
  Filled 2014-04-05 (×2): qty 1

## 2014-04-05 MED ORDER — CLOPIDOGREL BISULFATE 75 MG PO TABS
75.0000 mg | ORAL_TABLET | Freq: Every day | ORAL | Status: DC
  Administered 2014-04-06: 75 mg via ORAL
  Filled 2014-04-05: qty 1

## 2014-04-05 MED ORDER — AMIODARONE HCL 200 MG PO TABS
200.0000 mg | ORAL_TABLET | Freq: Every day | ORAL | Status: DC
  Administered 2014-04-06: 200 mg via ORAL
  Filled 2014-04-05: qty 1

## 2014-04-05 MED ORDER — METHIMAZOLE 5 MG PO TABS: 5.0000 mg | ORAL_TABLET | ORAL | Status: DC

## 2014-04-05 MED ORDER — ASPIRIN EC 81 MG PO TBEC
81.0000 mg | DELAYED_RELEASE_TABLET | Freq: Every day | ORAL | Status: DC
  Administered 2014-04-06: 81 mg via ORAL
  Filled 2014-04-05: qty 1

## 2014-04-05 MED ORDER — METOPROLOL SUCCINATE ER 100 MG PO TB24
100.0000 mg | ORAL_TABLET | Freq: Every day | ORAL | Status: DC
  Administered 2014-04-06: 100 mg via ORAL
  Filled 2014-04-05: qty 1

## 2014-04-05 MED ORDER — ENOXAPARIN SODIUM 40 MG/0.4ML ~~LOC~~ SOLN
40.0000 mg | SUBCUTANEOUS | Status: DC
  Administered 2014-04-05: 40 mg via SUBCUTANEOUS
  Filled 2014-04-05 (×2): qty 0.4

## 2014-04-05 MED ORDER — SODIUM CHLORIDE 0.9 % IV SOLN: 250.0000 mL | INTRAVENOUS | Status: DC | PRN

## 2014-04-05 MED ORDER — SODIUM CHLORIDE 0.9 % IJ SOLN: 3.0000 mL | Freq: Two times a day (BID) | INTRAMUSCULAR | Status: DC

## 2014-04-05 NOTE — ED Notes (Signed)
Dr.Lockwood at bedside  

## 2014-04-05 NOTE — H&P (Signed)
[name] [name] is an 80 y.o. male.   Chief Complaint: dizziness in the setting of severe AS HPI: The patient is an 80 year old man with a history of mitral valve regurgitation, status post mitral valve replacement approximately 6 years ago. He has known coronary artery disease, and is status post recent stent placement. The patient is being considered for TAVR secondary to severe aortic stenosis. Yesterday, he was working around his house, and notes that he may have overexerted himself. He did not have angina or shortness of breath. On several occasions however he had to stop and rest. Earlier today, he began to experience dizziness. He did not have frank syncope. His dizziness was not related to position or postural change. He had no significant palpitations. He presented to the emergency room where his blood pressure was 150. He has had no arrhythmias. He will be admitted for additional observation after he was found to have evidence of significant volume overload on physical examination.  Past Medical History  Diagnosis Date  . GASTRIC POLYP 05/13/2009  . COLONIC POLYPS, ADENOMATOUS 01/14/2008  . HYPERLIPIDEMIA 06/01/2007  . ANEMIA, IRON DEFICIENCY 10/13/2008  . COAGULOPATHY, COUMADIN-INDUCED 01/14/2008  . ANXIETY 09/04/2008  . HYPERTENSION 06/01/2007  . ATRIAL FIBRILLATION, CHRONIC 01/14/2008    on warfarin since 2007  . UNSPECIFIED VENOUS INSUFFICIENCY 09/18/2008  . PLEURAL EFFUSION, LEFT 10/13/2008  . GERD 06/01/2007  . BARRETTS ESOPHAGUS 03/20/2009  . PROSTATE CANCER, HX OF 01/14/2008  . CATARACTS, BILATERAL, HX OF 01/14/2008  . CEREBROVASCULAR ACCIDENT, HX OF 08/18/2008  . PERSONAL HX COLONIC POLYPS 01/01/2010  . BASAL CELL CARCINOMA OF SKIN SITE UNSPECIFIED 09/28/2010  . HYPERTHYROIDISM 01/06/2011  . Hyperglycemia   . Elevated PSA   . Restless leg syndrome   . Mitral regurgitation 06/13/2008    mitral valve replacement #20 St. Jude Biocor; Maze procedure by Dr. Amador Cunas  at [facility]  . CORONARY ARTERY  DISEASE 06/01/2007    Cath 04/03/2008; Cath 11/01/1990  . Tachy-brady syndrome 07/13/2008    medtronic Adapta gen. model ADDR01 ,serial # [id number] H by Dr  [name] at [facility]  . Peripheral artery disease 06/16/2010    LEA doppler-left ABI nml 0.61 calcified vessels;right ABI  0.68  . Edema, peripheral 07/30/2009    LEV doppler- no thrombus or thrombophlebitis  . Aortic valvular disorder 08/16/2012    echo EF 45-50% LV mildly reduce,Biocor mitral valve,mild to mod. tricuspid regrug.,mild to mod. aortic regrug.  . Mitral valve disorder 09/08/2011    echo EF 50-55% LV normal  . Aortic valvular stenosis 11/19/2013  . Cardiomyopathy, ischemic 11/19/2013    EF 45-50% with inferior wall hypokinesis by echo 2014  . S/P CABG x 1 06/13/2008    SVG to LAD with open vein harvest right thigh, LIMA harvested but not utilized by Dr Amador Cunas  . S/P mitral valve replacement with bioprosthetic valve 06/13/2008    86m St Jude Biocor porcine bioprosthetic tissue valve by Dr CAmador Cunas . S/P Maze operation for atrial fibrillation 06/13/2008    Partial left atrial lesion set using cryothermy for bilateral pulmonary vein isolation by Dr CAmador Cunas . Prosthetic valve dysfunction     Mitral stenosis involving bioprosthetic tissue valve placed 06/13/2008  . Mitral stenosis     Porcine bioprosthetic tissue valve placed 06/13/2008  . Dysrhythmia     HX OF TACHY BRADY  . Pacemaker     Past Surgical History  Procedure Laterality Date  . Prostatectomy    . Tonsillectomy    .  Cystectomy      Left leg  . Pacemaker placement  07/13/2008    Adapata dual chamber-Medtronic at [facility]   . Coronary artery bypass graft  06/13/2008    graft x1 w/vein graft LAD artery by Dr. Amador Cunas at [facility]  . Mitral valve replacement  06/13/2008    #20 St. Jude Bicor mitral valve ;Maze procedure at [facility] by Dr. Amador Cunas  . Cardiac catheterization  11/01/1990  . Skin cancer excision      removed from forehead and right leg, nose/face  . Upper  gastrointestinal endoscopy  07/14/2009    erosive reflux esopagitis, Barrett's esophagus  . Colonoscopy  07/14/2009    diverticulosis, internal hemorrhoids  . Cardiovascular stress test  06/03/2010    Persantine perfusion EF 45% mild to moderate ischemia basal and mid inferolateral region  . Cardiovascular stress test  01/01/2008    left ventricle normal,no significant ischemia  . Cardiovascular stress test  09/09/2005     possibility of ischemia inferior wall  . Tee without cardioversion N/A 01/22/2014    Procedure: TRANSESOPHAGEAL ECHOCARDIOGRAM (TEE);  Surgeon: Sanda Klein, MD;  Location: [facility] ENDOSCOPY;  Service: Cardiovascular;  Laterality: N/A;  . Coronary stent placement  03/24/2014    DES to LAD    Family History  Problem Relation Age of Onset  . Cancer Mother     Breast Cancer   Social History:  reports that he quit smoking about 48 years ago. His smoking use included Pipe. He has never used smokeless tobacco. He reports that he does not drink alcohol or use illicit drugs.  Allergies: No Known Allergies   (Not in a hospital admission)  Results for orders placed during the hospital encounter of 04/05/14 (from the past 48 hour(s))  CBG MONITORING, ED     Status: None   Collection Time    04/05/14  1:59 PM      Result Value Ref Range   Glucose-Capillary 91  70 - 99 mg/dL  CBC WITH DIFFERENTIAL     Status: Abnormal   Collection Time    04/05/14  2:08 PM      Result Value Ref Range   WBC 4.3  4.0 - 10.5 K/uL   RBC 3.97 (*) 4.22 - 5.81 MIL/uL   Hemoglobin 12.0 (*) 13.0 - 17.0 g/dL   HCT 38.5 (*) 39.0 - 52.0 %   MCV 97.0  78.0 - 100.0 fL   MCH 30.2  26.0 - 34.0 pg   MCHC 31.2  30.0 - 36.0 g/dL   RDW 14.1  11.5 - 15.5 %   Platelets 136 (*) 150 - 400 K/uL   Neutrophils Relative % 63  43 - 77 %   Neutro Abs 2.8  1.7 - 7.7 K/uL   Lymphocytes Relative 24  12 - 46 %   Lymphs Abs 1.0  0.7 - 4.0 K/uL   Monocytes Relative 9  3 - 12 %   Monocytes Absolute 0.4  0.1 - 1.0 K/uL    Eosinophils Relative 3  0 - 5 %   Eosinophils Absolute 0.1  0.0 - 0.7 K/uL   Basophils Relative 1  0 - 1 %   Basophils Absolute 0.0  0.0 - 0.1 K/uL  COMPREHENSIVE METABOLIC PANEL     Status: Abnormal   Collection Time    04/05/14  2:08 PM      Result Value Ref Range   Sodium 142  137 - 147 mEq/L   Potassium 4.2  3.7 - 5.3 mEq/L  Chloride 110  96 - 112 mEq/L   CO2 27  19 - 32 mEq/L   Glucose, Bld 86  70 - 99 mg/dL   BUN 25 (*) 6 - 23 mg/dL   Creatinine, Ser 0.85  0.50 - 1.35 mg/dL   Calcium 8.8  8.4 - 10.5 mg/dL   Total Protein 6.6  6.0 - 8.3 g/dL   Albumin 3.5  3.5 - 5.2 g/dL   AST 17  0 - 37 U/L   ALT 16  0 - 53 U/L   Alkaline Phosphatase 86  39 - 117 U/L   Total Bilirubin 0.3  0.3 - 1.2 mg/dL   GFR calc non Af Amer 78 (*) >90 mL/min   GFR calc Af Amer >90  >90 mL/min   Comment: (NOTE)     The eGFR has been calculated using the CKD EPI equation.     This calculation has not been validated in all clinical situations.     eGFR's persistently <90 mL/min signify possible Chronic Kidney     Disease.  TROPONIN I     Status: None   Collection Time    04/05/14  2:08 PM      Result Value Ref Range   Troponin I <0.30  <0.30 ng/mL   Comment:            Due to the release kinetics of cTnI,     a negative result within the first hours     of the onset of symptoms does not rule out     myocardial infarction with certainty.     If myocardial infarction is still suspected,     repeat the test at appropriate intervals.  URINALYSIS, ROUTINE W REFLEX MICROSCOPIC     Status: Abnormal   Collection Time    04/05/14  2:52 PM      Result Value Ref Range   Color, Urine YELLOW  YELLOW   APPearance CLEAR  CLEAR   Specific Gravity, Urine 1.028  1.005 - 1.030   pH 6.0  5.0 - 8.0   Glucose, UA NEGATIVE  NEGATIVE mg/dL   Hgb urine dipstick NEGATIVE  NEGATIVE   Bilirubin Urine NEGATIVE  NEGATIVE   Ketones, ur NEGATIVE  NEGATIVE mg/dL   Protein, ur NEGATIVE  NEGATIVE mg/dL   Urobilinogen, UA  1.0  0.0 - 1.0 mg/dL   Nitrite NEGATIVE  NEGATIVE   Leukocytes, UA TRACE (*) NEGATIVE  URINE MICROSCOPIC-ADD ON     Status: Abnormal   Collection Time    04/05/14  2:52 PM      Result Value Ref Range   WBC, UA 0-2  <3 WBC/hpf   Crystals CA OXALATE CRYSTALS (*) NEGATIVE   Urine-Other MUCOUS PRESENT     Dg Chest 2 View  04/05/2014   CLINICAL DATA:  Syncope, mitral valve replacement and CABG  EXAM: CHEST  2 VIEW  COMPARISON:  CT 02/21/2014 Chest radiograph 01/25/2013  FINDINGS: Left-sided pacemaker overlies enlarged cardiac silhouette. Midline sternotomy noted. Central venous pulmonary congestion. Small bilateral pleural effusions. No focal infiltrate. Patient is kyphotic. Central venous congestion present. No pneumothorax  IMPRESSION: 1. Cardiomegaly. 2. Central venous pulmonary congestion. 3. Basilar atelectasis. 4. No overt pulmonary edema or infiltrate identified.   Electronically Signed   By: Suzy Bouchard M.D.   On: 04/05/2014 14:52    ROS - All systems reviewed and negative except as noted in the history of present illness.  Blood pressure 151/88, pulse 75, temperature 97.4 F (36.3 C),  temperature source Oral, resp. rate 15, height 5' 10"  (1.778 m), weight 179 lb 1.6 oz (81.239 kg), SpO2 96.00%. Physical Exam  Stable appearing totally man, NAD HEENT: Unremarkable,[place], AT Neck:  10 cm JVD, no thyromegally Back:  No CVA tenderness Lungs:  Basilar rales, with no increased work of breathing. No wheezes or rhonchi. HEART:  Regular rate rhythm, grade 3/6 systolic murmurs, heard best at the right upper sternal border no rubs, no clicks Abd:  soft, positive bowel sounds, no organomegally, no rebound, no guarding Ext:  2 plus pulses, 1+ peripheral edema bilaterally, no cyanosis, no clubbing Skin:  No rashes no nodules Neuro:  CN II through XII intact, motor grossly intact  EKG: normal sinus rhythm with P. synchronous ventricular pacing   chest x-ray  - pending  Assessment/Plan 1.  dizziness  2. severe aortic stenosis  3. acute on chronic systolic and diastolic heart failure  4. complete heart block, status post pacemaker insertion  5. known coronary artery disease, status post recent stenting.   recommendation: The patient has come to the emergency room for evaluation of dizziness. He can be reproduced by movement of his head back and forth. At other times, it was related to movement and not movement of the head. On examination, he has increased jugular venous distention, basilar rales, and peripheral edema. While I would not recommend admission for his dizziness as it does not appear to be due to an arrhythmia or hypotension or to his critical/severe aortic stenosis, I would recommend admission for intravenous Lasix. He has evidence of volume overload.  Cristopher Peru, M.D.  Evans Lance 04/05/2014, 5:19 PM

## 2014-04-05 NOTE — ED Provider Notes (Signed)
CSN: [id number]     Arrival date & time 04/05/14  1306 History   First MD Initiated Contact with Patient 04/05/14 1327     Chief Complaint  Patient presents with  . Dizziness     (Consider location/radiation/quality/duration/timing/severity/associated sxs/prior Treatment) HPI  Patient presents with dizziness and weakness. Symptoms began over the past day.  Symptoms have been more prominent earlier today, better currently at rest. Symptoms seem to be more present with activity. There is no new pain anywhere, including chest pain. She denies syncope, vomiting, diarrhea, confusion, disorientation. Patient has a notable history of stenting of the LAD 2 weeks ago, and is currently being evaluated for aortic valve repair in 2 weeks. Patient states that he takes all medications as directed, including his Plavix.   History of present illness is per the patient and his wife.   Past Medical History  Diagnosis Date  . GASTRIC POLYP 05/13/2009  . COLONIC POLYPS, ADENOMATOUS 01/14/2008  . HYPERLIPIDEMIA 06/01/2007  . ANEMIA, IRON DEFICIENCY 10/13/2008  . COAGULOPATHY, COUMADIN-INDUCED 01/14/2008  . ANXIETY 09/04/2008  . HYPERTENSION 06/01/2007  . ATRIAL FIBRILLATION, CHRONIC 01/14/2008    on warfarin since 2007  . UNSPECIFIED VENOUS INSUFFICIENCY 09/18/2008  . PLEURAL EFFUSION, LEFT 10/13/2008  . GERD 06/01/2007  . BARRETTS ESOPHAGUS 03/20/2009  . PROSTATE CANCER, HX OF 01/14/2008  . CATARACTS, BILATERAL, HX OF 01/14/2008  . CEREBROVASCULAR ACCIDENT, HX OF 08/18/2008  . PERSONAL HX COLONIC POLYPS 01/01/2010  . BASAL CELL CARCINOMA OF SKIN SITE UNSPECIFIED 09/28/2010  . HYPERTHYROIDISM 01/06/2011  . Hyperglycemia   . Elevated PSA   . Restless leg syndrome   . Mitral regurgitation 06/13/2008    mitral valve replacement #20 St. Jude Biocor; Maze procedure by Dr. Amador Cunas  at [facility]  . CORONARY ARTERY DISEASE 06/01/2007    Cath 04/03/2008; Cath 11/01/1990  . Tachy-brady syndrome 07/13/2008    medtronic  Adapta gen. model ADDR01 ,serial # [id number] H by Dr  [name] at [facility]  . Peripheral artery disease 06/16/2010    LEA doppler-left ABI nml 0.61 calcified vessels;right ABI  0.68  . Edema, peripheral 07/30/2009    LEV doppler- no thrombus or thrombophlebitis  . Aortic valvular disorder 08/16/2012    echo EF 45-50% LV mildly reduce,Biocor mitral valve,mild to mod. tricuspid regrug.,mild to mod. aortic regrug.  . Mitral valve disorder 09/08/2011    echo EF 50-55% LV normal  . Aortic valvular stenosis 11/19/2013  . Cardiomyopathy, ischemic 11/19/2013    EF 45-50% with inferior wall hypokinesis by echo 2014  . S/P CABG x 1 06/13/2008    SVG to LAD with open vein harvest right thigh, LIMA harvested but not utilized by Dr Amador Cunas  . S/P mitral valve replacement with bioprosthetic valve 06/13/2008    29mm St Jude Biocor porcine bioprosthetic tissue valve by Dr Amador Cunas  . S/P Maze operation for atrial fibrillation 06/13/2008    Partial left atrial lesion set using cryothermy for bilateral pulmonary vein isolation by Dr Amador Cunas  . Prosthetic valve dysfunction     Mitral stenosis involving bioprosthetic tissue valve placed 06/13/2008  . Mitral stenosis     Porcine bioprosthetic tissue valve placed 06/13/2008  . Dysrhythmia     HX OF TACHY BRADY  . Pacemaker    Past Surgical History  Procedure Laterality Date  . Prostatectomy    . Tonsillectomy    . Cystectomy      Left leg  . Pacemaker placement  07/13/2008    Adapata dual chamber-Medtronic  at [facility]   . Coronary artery bypass graft  06/13/2008    graft x1 w/vein graft LAD artery by Dr. Amador Cunas at [facility]  . Mitral valve replacement  06/13/2008    #20 St. Jude Bicor mitral valve ;Maze procedure at [facility] by Dr. Amador Cunas  . Cardiac catheterization  11/01/1990  . Skin cancer excision      removed from forehead and right leg, nose/face  . Upper gastrointestinal endoscopy  07/14/2009    erosive reflux esopagitis, Barrett's esophagus  . Colonoscopy   07/14/2009    diverticulosis, internal hemorrhoids  . Cardiovascular stress test  06/03/2010    Persantine perfusion EF 45% mild to moderate ischemia basal and mid inferolateral region  . Cardiovascular stress test  01/01/2008    left ventricle normal,no significant ischemia  . Cardiovascular stress test  09/09/2005     possibility of ischemia inferior wall  . Tee without cardioversion N/A 01/22/2014    Procedure: TRANSESOPHAGEAL ECHOCARDIOGRAM (TEE);  Surgeon: Sanda Klein, MD;  Location: [facility] ENDOSCOPY;  Service: Cardiovascular;  Laterality: N/A;  . Coronary stent placement  03/24/2014    DES to LAD   Family History  Problem Relation Age of Onset  . Cancer Mother     Breast Cancer   History  Substance Use Topics  . Smoking status: Former Smoker -- 2 years    Types: Pipe    Quit date: 11/07/1965  . Smokeless tobacco: Never Used     Comment: quit over 40 years ago, never smoked cigarettes  . Alcohol Use: No    Review of Systems  Constitutional:       Per HPI, otherwise negative  HENT:       Per HPI, otherwise negative  Respiratory:       Per HPI, otherwise negative  Cardiovascular:       Per HPI, otherwise negative  Gastrointestinal: Negative for vomiting.  Endocrine:       Negative aside from HPI  Genitourinary:       Neg aside from HPI   Musculoskeletal:       Per HPI, otherwise negative  Skin: Negative.   Neurological: Positive for weakness. Negative for syncope.      Allergies  Review of patient's allergies indicates no known allergies.  Home Medications   Prior to Admission medications   Medication Sig Start Date End Date Taking? Authorizing Provider  amiodarone (PACERONE) 200 MG tablet Take 1 tablet (200 mg total) by mouth daily. 11/04/13   [name] [name], MD  aspirin EC 81 MG tablet Take 81 mg by mouth daily.    Historical Provider, MD  clopidogrel (PLAVIX) 75 MG tablet Take 1 tablet (75 mg total) by mouth daily. 03/14/14 03/14/15  [name], MD   ferrous sulfate 325 (65 FE) MG tablet Take 325 mg by mouth daily with breakfast.    Historical Provider, MD  methimazole (TAPAZOLE) 5 MG tablet Take 5 mg by mouth 3 (three) times a week. On Monday, Wednesday and Friday 02/25/14   [name], MD  metoprolol succinate (TOPROL-XL) 100 MG 24 hr tablet Take 100 mg by mouth daily. Take with or immediately following a meal. 02/05/14   [name], MD  Multiple Vitamin (MULTIVITAMIN WITH MINERALS) TABS Take 1 tablet by mouth daily. Centrum Silver    Historical Provider, MD  pantoprazole (PROTONIX) 40 MG tablet Take 1 tablet (40 mg total) by mouth 2 (two) times daily. 12/26/13   Gatha Mayer, MD  simvastatin (ZOCOR) 40 MG tablet Take 1  tablet (40 mg total) by mouth at bedtime. 05/14/13   [name], MD  Tamsulosin HCl (FLOMAX) 0.4 MG CAPS Take 0.4 mg by mouth every evening.     Historical Provider, MD   BP 139/92  Pulse 81  Temp(Src) 97.4 F (36.3 C) (Oral)  Resp 20  Ht 5\' 10"  (1.778 m)  Wt 179 lb 1.6 oz (81.239 kg)  BMI 25.70 kg/m2  SpO2 99% Physical Exam  Nursing note and vitals reviewed. Constitutional: He is oriented to person, place, and time. He appears well-developed. No distress.  HENT:  Head: Normocephalic and atraumatic.  Eyes: Conjunctivae and EOM are normal.  Cardiovascular: Normal rate and regular rhythm.   Murmur heard. Pulmonary/Chest: Effort normal. No stridor. No respiratory distress.  Abdominal: He exhibits no distension.  Musculoskeletal: He exhibits no edema.  Neurological: He is alert and oriented to person, place, and time.  Skin: Skin is warm and dry.  Psychiatric: He has a normal mood and affect.    ED Course  Procedures (including critical care time) Labs Review Labs Reviewed  CBC WITH DIFFERENTIAL  COMPREHENSIVE METABOLIC PANEL  TROPONIN I  URINALYSIS, ROUTINE W REFLEX MICROSCOPIC  POCT CBG (FASTING - GLUCOSE)-MANUAL ENTRY    Imaging Review No results found.   EKG Interpretation   Date/Time:   Saturday Apr 05 2014 13:14:03 EDT Ventricular Rate:  85 PR Interval:  184 QRS Duration: 202 QT Interval:  496 QTC Calculation: 590 R Axis:   -49 Text Interpretation:  AV dual-paced rhythm Abnormal ECG AV PACED RHYTHM  Abnormal ekg Confirmed by Carmin Muskrat  MD [phone]) on 04/05/2014 1:31:26  PM     I reviewed the patient's chart, including cardiology notes suggesting upcoming evaluation for aortic valve replacement.  3:52 PM On re-exam he appears calm.   MDM   Patient presents with concerns of dizziness, lightheadedness.  Patient's eval here was largely reassuring.  However, with patient's history of coronary disease, planned upcoming aortic repair, I discussed his case with our cardiology team, who will assist with the patient's disposition.    Carmin Muskrat, MD 04/05/14 [phone]

## 2014-04-05 NOTE — ED Notes (Signed)
Pt c/o dizziness and SOB on exertion. Onset last night. States he feels like the room is spinning and like he is drunk. Denies pain at the time. Respirations unlabored. Pt is alert and oriented x4.

## 2014-04-05 NOTE — Telephone Encounter (Signed)
     Patient called to report that when he lies down and moves his head to one side he gets extremely dizzy. He cannot move out of his arm chair because of the dizziness if he moves. Sx concerning for BPPV. I advised him to come to the ED for further work up.  Perry Mount PA-C  [facility]

## 2014-04-05 NOTE — ED Notes (Signed)
Pt. Stated, during the night i got really dizzy, everything was just going round and round and I got up and sit in a lounge chair half way sitting the rest of the night.  Im still light headed.

## 2014-04-05 NOTE — ED Notes (Signed)
Pt became nauseated and vomited up spit. Paged Cardiology first, then got one time order for zofran from [place].

## 2014-04-06 LAB — BASIC METABOLIC PANEL
BUN: 22 mg/dL (ref 6–23)
CO2: 27 mEq/L (ref 19–32)
Calcium: 8.7 mg/dL (ref 8.4–10.5)
Chloride: 110 mEq/L (ref 96–112)
Creatinine, Ser: 0.87 mg/dL (ref 0.50–1.35)
GFR calc Af Amer: 90 mL/min — ABNORMAL LOW (ref >90)
GFR, EST NON AFRICAN AMERICAN: 78 mL/min — AB (ref >90)
GLUCOSE: 100 mg/dL — AB (ref 70–99)
Potassium: 3.9 mEq/L (ref 3.7–5.3)
Sodium: 147 mEq/L (ref 137–147)

## 2014-04-06 LAB — CBC
HCT: 33.7 % — ABNORMAL LOW (ref 39.0–52.0)
Hemoglobin: 11.1 g/dL — ABNORMAL LOW (ref 13.0–17.0)
MCH: 31.2 pg (ref 26.0–34.0)
MCHC: 32.9 g/dL (ref 30.0–36.0)
MCV: 94.7 fL (ref 78.0–100.0)
Platelets: 126 10*3/uL — ABNORMAL LOW (ref 150–400)
RBC: 3.56 MIL/uL — ABNORMAL LOW (ref 4.22–5.81)
RDW: 14.2 % (ref 11.5–15.5)
WBC: 4.7 10*3/uL (ref 4.0–10.5)

## 2014-04-06 NOTE — Progress Notes (Signed)
Patient ID: [name] [name], male   DOB: [birth year], 80 y.o.   MRN: [id number]

## 2014-04-06 NOTE — Progress Notes (Signed)
Patient ID: DEUNTE BLEDSOE, male   DOB: 1929/03/18, 80 y.o.   MRN: [id number]   Patient Name: [name] [name] Date of Encounter: 04/06/2014     Active Problems:   Dizziness    SUBJECTIVE "I feel better", denies dizziness  CURRENT MEDS . amiodarone  200 mg Oral Daily  . aspirin EC  81 mg Oral Daily  . clopidogrel  75 mg Oral Q breakfast  . enoxaparin (LOVENOX) injection  40 mg Subcutaneous Q24H  . ferrous sulfate  325 mg Oral Q breakfast  . [START ON 04/07/2014] methimazole  5 mg Oral Q M,W,F  . metoprolol succinate  100 mg Oral Daily  . multivitamin with minerals  1 tablet Oral Daily  . pantoprazole  40 mg Oral BID  . simvastatin  40 mg Oral QHS  . sodium chloride  3 mL Intravenous Q12H  . tamsulosin  0.4 mg Oral QPM    OBJECTIVE  Filed Vitals:   04/05/14 1852 04/05/14 2020 04/06/14 0559 04/06/14 1010  BP: 158/89 127/82 111/67 100/60  Pulse: 79 75 76 70  Temp: 97.2 F (36.2 C) 97.5 F (36.4 C) 98 F (36.7 C)   TempSrc:  Oral Oral   Resp: 20 18 18    Height:      Weight:   172 lb 9.6 oz (78.291 kg)   SpO2: 96% 97% 95%     Intake/Output Summary (Last 24 hours) at 04/06/14 1232 Last data filed at 04/06/14 0915  Gross per 24 hour  Intake    120 ml  Output   1800 ml  Net  -1680 ml   Filed Weights   04/05/14 1319 04/06/14 0559  Weight: 179 lb 1.6 oz (81.239 kg) 172 lb 9.6 oz (78.291 kg)    PHYSICAL EXAM  General: Pleasant, NAD. Neuro: Alert and oriented X 3. Moves all extremities spontaneously. Psych: Normal affect. HEENT:  Normal  Neck: Supple without bruits or JVD. Lungs:  Resp regular and unlabored, CTA. Heart: RRR no s3, s4, AS murmur present. Abdomen: Soft, non-tender, non-distended, BS + x 4.  Extremities: No clubbing, cyanosis, 1+ edema bilaterally, improved. DP/PT/Radials 2+ and equal bilaterally.  Accessory Clinical Findings  CBC  Recent Labs  04/05/14 1408 04/06/14 0529  WBC 4.3 4.7  NEUTROABS 2.8  --   HGB 12.0* 11.1*  HCT 38.5* 33.7*   MCV 97.0 94.7  PLT 136* 785*   Basic Metabolic Panel  Recent Labs  04/05/14 1408 04/05/14 1734 04/06/14 0529  NA 142  --  147  K 4.2  --  3.9  CL 110  --  110  CO2 27  --  27  GLUCOSE 86  --  100*  BUN 25*  --  22  CREATININE 0.85  --  0.87  CALCIUM 8.8  --  8.7  MG  --  2.0  --    Liver Function Tests  Recent Labs  04/05/14 1408  AST 17  ALT 16  ALKPHOS 86  BILITOT 0.3  PROT 6.6  ALBUMIN 3.5   No results found for this basename: LIPASE, AMYLASE,  in the last 72 hours Cardiac Enzymes  Recent Labs  04/05/14 1408  TROPONINI <0.30   BNP No components found with this basename: POCBNP,  D-Dimer No results found for this basename: DDIMER,  in the last 72 hours Hemoglobin A1C No results found for this basename: HGBA1C,  in the last 72 hours Fasting Lipid Panel No results found for this basename: CHOL, HDL, LDLCALC, TRIG, CHOLHDL,  LDLDIRECT,  in the last 72 hours Thyroid Function Tests  Recent Labs  04/05/14 1734  TSH 3.860    TELE  nsr with ventricular pacing  Radiology/Studies  Dg Chest 2 View  04/05/2014   CLINICAL DATA:  Syncope, mitral valve replacement and CABG  EXAM: CHEST  2 VIEW  COMPARISON:  CT 02/21/2014 Chest radiograph 01/25/2013  FINDINGS: Left-sided pacemaker overlies enlarged cardiac silhouette. Midline sternotomy noted. Central venous pulmonary congestion. Small bilateral pleural effusions. No focal infiltrate. Patient is kyphotic. Central venous congestion present. No pneumothorax  IMPRESSION: 1. Cardiomegaly. 2. Central venous pulmonary congestion. 3. Basilar atelectasis. 4. No overt pulmonary edema or infiltrate identified.   Electronically Signed   By: Suzy Bouchard M.D.   On: 04/05/2014 14:52    ASSESSMENT AND PLAN 1. Dizziness 2. Acute on chronic diastolic heart failure 3. Severe aortic stenosis 4. H/o dietary indiscretion - patient had bacon and sausage yesterday before coming into the ER. Rec: ok for discharge. Dizziness  resolved. He has diuresed 7 lbs with 40 mg of IV lasix. He is pending TAVR. He has follow up scheduled. I warned him on the importance of not eating salty foods. I suspect his dizziness is more inner ear than anything as he was dizzy in the ER with a systolic pressure of 563.  Milo Solana,M.D.  04/06/2014 12:32 PM

## 2014-04-06 NOTE — Discharge Summary (Signed)
Discharge Summary   Patient ID: [name] [name] MRN: [id number], DOB/AGE: Sep 09, 1929 80 y.o. Admit date: 04/05/2014 D/C date:     04/06/2014  Primary Cardiologist: Dr. Aaron Mose (TAVR)  Principal Problem:   Acute combined systolic and diastolic heart failure Active Problems:   HYPERLIPIDEMIA   HYPERTENSION   Paroxysmal atrial fibrillation status post cryoablation   BARRETTS ESOPHAGUS   PROSTATE CANCER, HX OF   HYPERTHYROIDISM   Cardiomyopathy, ischemic   Aortic stenosis   CAD S/P percutaneous coronary angioplasty - Xience DES 3.0 mm x 8 mm prox LAD   SSS (sick sinus syndrome)   Dizziness   Discharge Diagnosis: Acute on chronic mixed systolic and diastolic CHF s/p IV diuresis.  HPI: [name] [name] is an 80 y.o. male with a history of PAF on coumadin, MVP s/p robotic MVR (29 mm [employer] Biocor) @ [facility] & [facility]G-LAD w/ Cryo-Maze, embolic CVA w/ full recovery, SSS -s/p PPM, CAD s/p single vessel CABG and recent DES to pLAD and hyperthyroidism who presented to [facility] with dizziness. He was found to have an acute CHF exacerbation after recent dietary indiscretion.    Hospital Course  Acute on chronic systolic and diastolic heart failure- Pulmonary congestion on CXR; BNP 3735.  -- H/o dietary indiscretion - patient had bacon and sausage yesterday before coming into the ER. He was warned on the importance of not eating salty foods.  -- Recent ECHO with  EF 40-45% -- He has diuresed 7 lbs with 40 mg of IV lasix. Neg 1.7 L. Discharge weight 172lbs.   Dizziness - Resolved. Dizziness suspected to be more inner ear than anything as he was dizzy in the ER with a systolic pressure of 270. Also, his dizziness is positional and made worse by certain head movements.  Severe aortic stenosis - followed by Dr. Burt Knack and Dr. Roxy Manns. Being worked up for TAVR.  The patient has had an uncomplicated hospital course and is recovering well.  He has been seen by Dr. Lovena Le today and deemed ready for discharge  home. All follow-up appointments have been scheduled.  Discharge medications are listed below. No medication changes have been made.    Discharge Vitals: Blood pressure 100/60, pulse 70, temperature 98 F (36.7 C), temperature source Oral, resp. rate 18, height 5\' 10"  (1.778 m), weight 172 lb 9.6 oz (78.291 kg), SpO2 95.00%.  Labs: Lab Results  Component Value Date   WBC 4.7 04/06/2014   HGB 11.1* 04/06/2014   HCT 33.7* 04/06/2014   MCV 94.7 04/06/2014   PLT 126* 04/06/2014     Recent Labs Lab 04/05/14 1408 04/06/14 0529  NA 142 147  K 4.2 3.9  CL 110 110  CO2 27 27  BUN 25* 22  CREATININE 0.85 0.87  CALCIUM 8.8 8.7  PROT 6.6  --   BILITOT 0.3  --   ALKPHOS 86  --   ALT 16  --   AST 17  --   GLUCOSE 86 100*    Recent Labs  04/05/14 1408  TROPONINI <0.30    Diagnostic Studies/Procedures   Dg Chest 2 View  04/05/2014   CLINICAL DATA:  Syncope, mitral valve replacement and CABG  EXAM: CHEST  2 VIEW  COMPARISON:  CT 02/21/2014 Chest radiograph 01/25/2013  FINDINGS: Left-sided pacemaker overlies enlarged cardiac silhouette. Midline sternotomy noted. Central venous pulmonary congestion. Small bilateral pleural effusions. No focal infiltrate. Patient is kyphotic. Central venous congestion present. No pneumothorax  IMPRESSION: 1. Cardiomegaly. 2. Central venous pulmonary  congestion. 3. Basilar atelectasis. 4. No overt pulmonary edema or infiltrate identified.    Discharge Medications     Medication List         amiodarone 200 MG tablet  Commonly known as:  PACERONE  Take 200 mg by mouth daily.     aspirin EC 81 MG tablet  Take 81 mg by mouth daily.     clopidogrel 75 MG tablet  Commonly known as:  PLAVIX  Take 75 mg by mouth daily with breakfast.     ferrous sulfate 325 (65 FE) MG tablet  Take 325 mg by mouth daily with breakfast.     methimazole 5 MG tablet  Commonly known as:  TAPAZOLE  Take 5 mg by mouth every Monday, Wednesday, and Friday.     metoprolol  succinate 100 MG 24 hr tablet  Commonly known as:  TOPROL-XL  Take 100 mg by mouth daily. Take with or immediately following a meal.     multivitamin with minerals Tabs tablet  Take 1 tablet by mouth daily. Centrum Silver     pantoprazole 40 MG tablet  Commonly known as:  PROTONIX  Take 40 mg by mouth 2 (two) times daily.     simvastatin 40 MG tablet  Commonly known as:  ZOCOR  Take 40 mg by mouth at bedtime.     tamsulosin 0.4 MG Caps capsule  Commonly known as:  FLOMAX  Take 0.4 mg by mouth every evening.        Disposition   The patient will be discharged in stable condition to home.      Duration of Discharge Encounter: Greater than 30 minutes including physician and PA time.  Signed, Perry Mount PA-C 04/06/2014, 1:54 PM   Cardiology Attending  Agree with above. [place] for [employer].  Mikle Bosworth.D.

## 2014-04-06 NOTE — Progress Notes (Signed)
Utilization Review Completed.  

## 2014-04-07 ENCOUNTER — Other Ambulatory Visit: Payer: Self-pay | Admitting: *Deleted

## 2014-04-07 ENCOUNTER — Telehealth: Payer: Self-pay | Admitting: Cardiovascular Disease

## 2014-04-07 DIAGNOSIS — I359 Nonrheumatic aortic valve disorder, unspecified: Secondary | ICD-10-CM

## 2014-04-09 ENCOUNTER — Encounter: Payer: Self-pay | Admitting: Surgery

## 2014-04-09 ENCOUNTER — Institutional Professional Consult (permissible substitution) (INDEPENDENT_AMBULATORY_CARE_PROVIDER_SITE_OTHER): Payer: Medicare Other | Admitting: Surgery

## 2014-04-09 VITALS — BP 112/82 | HR 92 | Resp 20 | Ht 70.0 in | Wt 172.0 lb

## 2014-04-09 DIAGNOSIS — Z952 Presence of prosthetic heart valve: Secondary | ICD-10-CM

## 2014-04-09 DIAGNOSIS — I359 Nonrheumatic aortic valve disorder, unspecified: Secondary | ICD-10-CM | POA: Diagnosis not present

## 2014-04-09 DIAGNOSIS — Z9889 Other specified postprocedural states: Secondary | ICD-10-CM

## 2014-04-09 DIAGNOSIS — I350 Nonrheumatic aortic (valve) stenosis: Secondary | ICD-10-CM

## 2014-04-09 DIAGNOSIS — Z8679 Personal history of other diseases of the circulatory system: Secondary | ICD-10-CM

## 2014-04-09 DIAGNOSIS — Z953 Presence of xenogenic heart valve: Secondary | ICD-10-CM

## 2014-04-09 DIAGNOSIS — Z951 Presence of aortocoronary bypass graft: Secondary | ICD-10-CM | POA: Diagnosis not present

## 2014-04-11 ENCOUNTER — Ambulatory Visit (HOSPITAL_COMMUNITY)
Admission: RE | Admit: 2014-04-11 | Discharge: 2014-04-11 | Disposition: A | Discharge status: Home / Self Care | Payer: Medicare Other | Source: Ambulatory Visit | Attending: Cardiovascular Disease | Admitting: Cardiovascular Disease

## 2014-04-11 ENCOUNTER — Encounter (HOSPITAL_COMMUNITY): Payer: Self-pay

## 2014-04-11 ENCOUNTER — Encounter: Payer: Self-pay | Admitting: Surgery

## 2014-04-11 DIAGNOSIS — I359 Nonrheumatic aortic valve disorder, unspecified: Secondary | ICD-10-CM | POA: Insufficient documentation

## 2014-04-11 DIAGNOSIS — I350 Nonrheumatic aortic (valve) stenosis: Secondary | ICD-10-CM

## 2014-04-11 MED ORDER — DOBUTAMINE INFUSION FOR EP/ECHO/NUC (1000 MCG/ML)
0.0000 ug/kg/min | INTRAVENOUS | Status: DC
  Administered 2014-04-11: 10 ug/kg/min via INTRAVENOUS

## 2014-04-11 MED ORDER — METOPROLOL TARTRATE 1 MG/ML IV SOLN
INTRAVENOUS | Status: AC
  Filled 2014-04-11: qty 5

## 2014-04-11 MED ORDER — ATROPINE SULFATE 0.1 MG/ML IJ SOLN
INTRAMUSCULAR | Status: AC
  Filled 2014-04-11: qty 10

## 2014-04-11 MED ORDER — SODIUM CHLORIDE 0.9 % IV SOLN
INTRAVENOUS | Status: DC
  Administered 2014-04-11: 250 mL via INTRAVENOUS

## 2014-04-11 NOTE — OR Nursing (Signed)
Dobutamine drip started 7mcg/kg/min and slowly increased to 24mcg/kg/min. HR 71 originally, then when at 111 some ectopy, drip stopped per [name], [place]. VSS

## 2014-04-11 NOTE — Progress Notes (Signed)
  Echocardiogram Echocardiogram Pharmacologic Stress Test has been performed.  [place] 04/11/2014, 1:04 PM

## 2014-04-11 NOTE — Progress Notes (Signed)
[facility]  MULTIDISCIPLINARY HEART VALVE CLINIC    CARDIOTHORACIC SURGERY CONSULTATION REPORT  Referring Provider is [name], Dani Gobble, MD PCP is [name], MD  Chief Complaint  Patient presents with  . Aortic Stenosis    2nd surgical eval for possible TAVR procedure    HPI:  Patient is an 80 year old white male with a history of mitral regurgitation, coronary artery disease, and atrial fibrillation previously followed by Dr. Rollene Fare who underwent mitral valve replacement using a bioprosthetic tissue valve, single-vessel coronary artery bypass grafting, and a limited lesion set Maze procedure in 2009 at [place] by Dr. Amador Cunas. He was referred to Dr. Amador Cunas  for possible robotic mitral valve repair and maze procedure but he was noted to have single-vessel coronary artery disease with stenosis of the proximal left anterior descending coronary artery. Because of this the patient underwent  median sternotomy for surgery by Dr. Amador Cunas on 06/13/2008. His mitral valve was replaced using a 29 mm St. Jude Biocor bioprosthetic tissue valve because of severe calcification involving the posterior leaflet. The procedure was very complicated and prolonged with problems exposing mitral valve, severe bleeding from torn left atrium requiring pericardial patch closure, and severe bleeding from the ascending thoracic aorta which was reportedly thin walled. In addition, attempted transesophageal echocardiogram performed intraoperatively was severely limited because of the patient's very large hiatal hernia. He was lucky to survive the surgery. The patient suffered a stroke manifested as transient left-sided hemiplegia in the immediate postoperative period but symptoms improved rapidly and the patient ultimately recovered without significant long-term consequences. The patient also required placement of a permanent pacemaker because of tachybradycardia syndrome with symptomatic bradycardia during  his early postoperative convalescence. He eventually recovered and returned to normal activity with normal exercise tolerance. Follow-up echocardiograms have shown development of progressive aortic stenosis. His most recent echo on 03/14/2014 showed severely thickened and calcified aortic valve leaflets with and AVA of 0.74 cm2 by Vmax and 0.76 cm2 by VTI. The mean gradient was only 25 mm Hg and the peak 41. The LVEF was moderately reduced to 40-45%. The mitral valve prosthesis was functioning normally. RV systolic function was reduced. He underwent left and right heart catheterization by Dr. [name] on 01/08/2014. This showed at least moderate aortic stenosis with mean transvalvular gradient measured 22 mm Hg. Mean transvalvular gradient across the mitral valve was measured 13.7 mm mercury. There was mild to moderate pulmonary hypertension with PA pressures measured 56/33 and central venous pressure 18. Cardiac output was measured 4.1 L per minute corresponding to a cardiac index of 2.1. The patient was also noted to have significant two-vessel coronary artery disease and vein graft disease with high-grade 95% proximal stenosis of the left anterior descending coronary artery and ulcerated 60-70% stenosis of the distal right coronary artery with right dominant coronary circulation. The previous saphenous vein graft to the left anterior descending coronary artery was chronically occluded. He subsequently underwent successful PCI of the proximal LAD with a DES using rotational atherectomy by Dr. Burt Knack on 03/24/2014 and has been on DAPT with ASA and Plavix.    Past Medical History  Diagnosis Date  . GASTRIC POLYP 05/13/2009  . COLONIC POLYPS, ADENOMATOUS 01/14/2008  . HYPERLIPIDEMIA 06/01/2007  . ANEMIA, IRON DEFICIENCY 10/13/2008  . COAGULOPATHY, COUMADIN-INDUCED 01/14/2008  . ANXIETY 09/04/2008  . HYPERTENSION 06/01/2007  . ATRIAL FIBRILLATION, CHRONIC 01/14/2008    on warfarin since 2007  . UNSPECIFIED VENOUS  INSUFFICIENCY 09/18/2008  . PLEURAL EFFUSION, LEFT 10/13/2008  . GERD  06/01/2007  . BARRETTS ESOPHAGUS 03/20/2009  . PROSTATE CANCER, HX OF 01/14/2008  . CATARACTS, BILATERAL, HX OF 01/14/2008  . CEREBROVASCULAR ACCIDENT, HX OF 08/18/2008  . PERSONAL HX COLONIC POLYPS 01/01/2010  . BASAL CELL CARCINOMA OF SKIN SITE UNSPECIFIED 09/28/2010  . HYPERTHYROIDISM 01/06/2011  . Hyperglycemia   . Elevated PSA   . Restless leg syndrome   . Mitral regurgitation 06/13/2008    mitral valve replacement #20 St. Jude Biocor; Maze procedure by Dr. Amador Cunas  at [facility]  . CORONARY ARTERY DISEASE 06/01/2007    Cath 04/03/2008; Cath 11/01/1990  . Tachy-brady syndrome 07/13/2008    medtronic Adapta gen. model ADDR01 ,serial # [id number] H by Dr  [name] at [facility]  . Peripheral artery disease 06/16/2010    LEA doppler-left ABI nml 0.61 calcified vessels;right ABI  0.68  . Edema, peripheral 07/30/2009    LEV doppler- no thrombus or thrombophlebitis  . Aortic valvular disorder 08/16/2012    echo EF 45-50% LV mildly reduce,Biocor mitral valve,mild to mod. tricuspid regrug.,mild to mod. aortic regrug.  . Mitral valve disorder 09/08/2011    echo EF 50-55% LV normal  . Aortic valvular stenosis 11/19/2013  . Cardiomyopathy, ischemic 11/19/2013    EF 45-50% with inferior wall hypokinesis by echo 2014  . S/P CABG x 1 06/13/2008    SVG to LAD with open vein harvest right thigh, LIMA harvested but not utilized by Dr Amador Cunas  . S/P mitral valve replacement with bioprosthetic valve 06/13/2008    2m St Jude Biocor porcine bioprosthetic tissue valve by Dr CAmador Cunas . S/P Maze operation for atrial fibrillation 06/13/2008    Partial left atrial lesion set using cryothermy for bilateral pulmonary vein isolation by Dr CAmador Cunas . Prosthetic valve dysfunction     Mitral stenosis involving bioprosthetic tissue valve placed 06/13/2008  . Mitral stenosis     Porcine bioprosthetic tissue valve placed 06/13/2008  . Dysrhythmia     HX OF  TACHY BRADY  . Pacemaker     Past Surgical History  Procedure Laterality Date  . Prostatectomy    . Tonsillectomy    . Cystectomy      Left leg  . Pacemaker placement  07/13/2008    [place] [facility]  . Coronary artery bypass graft  06/13/2008    graft x1 w/[facility] . Mitral valve replacement  06/13/2008    #20 St. Jude Bicor mitral valve ;[facility] . Cardiac catheterization  11/01/1990  . Skin cancer excision      removed from forehead and right leg, nose/face  . Upper gastrointestinal endoscopy  07/14/2009    erosive reflux esopagitis, Barrett's esophagus  . Colonoscopy  07/14/2009    diverticulosis, internal hemorrhoids  . Cardiovascular stress test  06/03/2010    Persantine perfusion EF 45% mild to moderate ischemia basal and mid inferolateral region  . Cardiovascular stress test  01/01/2008    left ventricle normal,no significant ischemia  . Cardiovascular stress test  09/09/2005     possibility of ischemia inferior wall  . Tee without cardioversion N/A 01/22/2014    Procedure: TRANSESOPHAGEAL ECHOCARDIOGRAM (TEE);  Surgeon: MSanda Klein MD;  [facility];  Service: Cardiovascular;  Laterality: N/A;  . Coronary stent placement  03/24/2014    DES to LAD    Family History  Problem Relation Age of Onset  . Cancer Mother     Breast Cancer  History   Social History  . Marital Status: Married    Spouse Name: N/A    Number of Children: 4  . Years of Education: N/A   Occupational History  . Retired    Social History Main Topics  . Smoking status: Former Smoker -- 2 years    Types: Pipe    Quit date: 11/07/1965  . Smokeless tobacco: Never Used     Comment: quit over 40 years ago, never smoked cigarettes  . Alcohol Use: No  . Drug Use: No  . Sexual Activity: Not on file   Other Topics Concern  . Not on file   Social History Narrative  . No narrative on file     Current Outpatient Prescriptions  Medication Sig Dispense Refill  . amiodarone (PACERONE) 200 MG tablet Take 200 mg by mouth daily.      Marland Kitchen aspirin EC 81 MG tablet Take 81 mg by mouth daily.      . clopidogrel (PLAVIX) 75 MG tablet Take 75 mg by mouth daily with breakfast.      . ferrous sulfate 325 (65 FE) MG tablet Take 325 mg by mouth daily with breakfast.      . methimazole (TAPAZOLE) 5 MG tablet Take 5 mg by mouth every Monday, Wednesday, and Friday.       . metoprolol succinate (TOPROL-XL) 100 MG 24 hr tablet Take 100 mg by mouth daily. Take with or immediately following a meal.      . Multiple Vitamin (MULTIVITAMIN WITH MINERALS) TABS Take 1 tablet by mouth daily. Centrum Silver      . pantoprazole (PROTONIX) 40 MG tablet Take 40 mg by mouth 2 (two) times daily.      . simvastatin (ZOCOR) 40 MG tablet Take 40 mg by mouth at bedtime.      . Tamsulosin HCl (FLOMAX) 0.4 MG CAPS Take 0.4 mg by mouth every evening.        No current facility-administered medications for this visit.    No Known Allergies    Review of Systems:   General: normal appetite, decreased energy, no weight gain, no weight loss, no fever  Cardiac: no chest pain with exertion, no chest pain at rest, + SOB with mild exertion, no resting SOB, no PND, no orthopnea, no palpitations, + arrhythmia, + atrial fibrillation, + LE edema, no dizzy spells, no syncope  Respiratory: + shortness of breath, no home oxygen, no productive cough, no dry cough, no bronchitis, no wheezing, no hemoptysis, no asthma, no pain with inspiration or cough, no sleep apnea, no CPAP at night  GI: no difficulty swallowing, + reflux, no frequent heartburn, + hiatal hernia, no abdominal pain, no constipation, no diarrhea, no hematochezia, no hematemesis, no melena  GU: no dysuria, + frequency, no urinary tract infection, no hematuria, no enlarged prostate, no kidney stones, no kidney disease  Vascular: no pain suggestive of claudication, no pain  in feet, no leg cramps, no varicose veins, no DVT, no non-healing foot ulcer  Neuro: + stroke, no TIA's, no seizures, no headaches, no temporary blindness one eye, no slurred speech, no peripheral neuropathy, no chronic pain, no instability of gait, no memory/cognitive dysfunction  Musculoskeletal: no arthritis, no joint swelling, no myalgias, no difficulty walking, normal mobility  Skin: chronic rash both lower legs, no itching, no skin infections, no pressure sores or ulcerations  Psych: no anxiety, no depression, no nervousness, no unusual recent stress  Eyes: no blurry vision, no floaters, no recent vision changes, +  wears glasses or contacts  ENT: no hearing loss, no loose or painful teeth, no dentures, last saw dentist November 2014  Hematologic: no easy bruising, no abnormal bleeding, no clotting disorder, no frequent epistaxis  Endocrine: no diabetes, does not check CBG's at home       Physical Exam:   BP 112/82  Pulse 92  Resp 20  Ht 5' 10"  (1.778 m)  Wt 172 lb (78.019 kg)  BMI 24.68 kg/m2  SpO2 98%  General:             well-appearing gentleman in no distress  HEENT:  [place]/AT, PERLA, oropharynx clear  Neck:   no JVD, no bruits, no adenopathy or thyromegaly  Chest:   clear to auscultation, symmetrical breath sounds, no wheezes, no rhonchi   CV:   RRR, grade III/VI crescendo/decrescendo murmur heard best at RSB,  no diastolic murmur  Abdomen:  soft, non-tender, no masses or organomegaly. Bowel sounds normal  Extremities:  warm, well-perfused, pulses decreased but palpable bilaterally, mild bilateral LE edema             Neuro                          Alert and oriented x 3, Motor and sensory grossly normal.  Skin:   Clean and dry, no rashes, no breakdown   Diagnostic Tests:  *[place]* *[facility]* [address], [place] [address] [phone]  ------------------------------------------------------------ Transthoracic  Echocardiography  Patient: Aisea, Bouldin MR #: [id number] Study Date: 03/14/2014 Gender: M Age: 89 Height: 177.8cm Weight: 80.7kg BSA: 1.53m2 Pt. Status: Room:  OAndrena MewsMD REFERRING EGloris HamSONOGRAPHER LMauricio Po RDCS, CCT PERFORMING [facility], Outpatient cc:  ------------------------------------------------------------ LV EF: 40% - 45%  ------------------------------------------------------------ Indications: Aortic stenosis 424.1.  ------------------------------------------------------------ History: Risk factors: Mitral valve replacement.  ------------------------------------------------------------ Study Conclusions  - Left ventricle: The cavity size was normal. There was mild concentric hypertrophy. Systolic function was mildly to moderately reduced. The estimated ejection fraction was in the range of 40% to 45%. - Ventricular septum: Septal motion showed paradox. These changes are consistent with right ventricular pacing. - Aortic valve: Valve mobility was severely restricted. Mild regurgitation. Valve area: 0.76cm^2(VTI). Valve area: 0.74cm^2 (Vmax). - Mitral valve: A 279miocor porcine bioprosthesis was present and functioning normally. - Left atrium: The atrium was severely dilated. - Right ventricle: Systolic function was reduced. - Right atrium: The atrium was mildly dilated. - Pulmonary arteries: Systolic pressure was mildly increased. PA peak pressure: 4530mg (S).  ------------------------------------------------------------ Labs, prior tests, procedures, and surgery: Permanent pacemaker system implantation.  Transthoracic echocardiography. M-mode, complete 2D, spectral Doppler, and color Doppler. Height: Height: 177.8cm. Height: 70in. Weight: Weight: 80.7kg. Weight: 177.6lb. Body mass index: BMI: 25.5kg/m^2. Body surface area: BSA: 1.3m71mBlood pressure: 165/128. Patient status: Outpatient. Location: Echo  laboratory.  ------------------------------------------------------------  ------------------------------------------------------------ Left ventricle: The cavity size was normal. There was mild concentric hypertrophy. Systolic function was mildly to moderately reduced. The estimated ejection fraction was in the range of 40% to 45%. Images were inadequate for LV wall motion assessment.  ------------------------------------------------------------ Aortic valve: Severely thickened, severely calcified leaflets. Valve mobility was severely restricted. Doppler: Mild regurgitation. Valve area: 0.76cm^2(VTI). Indexed valve area: 0.38cm^2/m^2 (VTI). Peak velocity ratio of LVOT to aortic valve: 0.21. Valve area: 0.74cm^2 (Vmax). Indexed valve area: 0.37cm^2/m^2 (Vmax). Mean gradient: 25mm81m(S). Peak gradient: 41mm 22mS).  ------------------------------------------------------------ Mitral valve: A 29mmBi57m porcine bioprosthesis was  present and functioning normally. Gradients are similar to previous studies. Preserved subvalvular apparatus. Gradients are probably normal for this valve. Gradients recorded at a heart rate of 88 bpm. Doppler: No significant regurgitation. Valve area by pressure half-time: 2.68cm^2. Indexed valve area by pressure half-time: 1.35cm^2/m^2. Mean gradient: 56m Hg (D). Peak gradient: 178mHg (D).  ------------------------------------------------------------ Left atrium: The atrium was severely dilated.  ------------------------------------------------------------ Right ventricle: The cavity size was normal. Wall thickness was normal. Pacer wire or catheter noted in right ventricle. Systolic function was reduced.  ------------------------------------------------------------ Ventricular septum: Septal motion showed paradox. These changes are consistent with right ventricular pacing.  ------------------------------------------------------------ Pulmonic  valve: The valve appears to be grossly normal. Doppler: Trivial regurgitation.  ------------------------------------------------------------ Tricuspid valve: Structurally normal valve. Leaflet separation was normal. Doppler: Transvalvular velocity was within the normal range. Mild regurgitation.  ------------------------------------------------------------ Pulmonary artery: Systolic pressure was mildly increased.  ------------------------------------------------------------ Right atrium: The atrium was mildly dilated. Pacer wire or catheter noted in right atrium.  ------------------------------------------------------------ Pericardium: There was no pericardial effusion.  ------------------------------------------------------------ Systemic veins: Inferior vena cava: The vessel was dilated; the respirophasic diameter changes were blunted (< 50%); findings are consistent with elevated central venous pressure.  ------------------------------------------------------------  2D measurements Normal Doppler measurements Normal Left ventricle Main pulmonary LVID ED, 48.3 mm 43-52 artery chord, Pressure, 45 mm Hg =30 PLAX S LVID ES, 37.2 mm 23-38 Pressure 12 mm Hg ------ chord, ED PLAX LVOT FS, chord, 23 % >29 Peak vel, 66 cm/s ------ PLAX S LVPW, ED 13.5 mm ------ Aortic valve IVS/LVPW 0.92 <1.3 Peak vel, 319 cm/s ------ ratio, ED S Ventricular septum Mean vel, 228 cm/s ------ IVS, ED 12.4 mm ------ S LVOT VTI, S 84.7 cm ------ Diam, S 21.3 mm ------ Mean 25 mm Hg ------ Area 3.56 cm^2 ------ gradient, Aorta S Root diam, 31 mm ------ Peak 41 mm Hg ------ ED gradient, Left atrium S AP dim 51 mm ------ Area, VTI 0.76 cm^2 ------ AP dim 2.56 cm/m^2 <2.2 Area index 0.38 cm^2/m ------ index (VTI) ^2 Peak vel 0.21 ------ ratio, LVOT/AV Area, Vmax 0.74 cm^2 ------ Area index 0.37 cm^2/m ------ (Vmax) ^2 Mitral valve Peak E vel 217 cm/s ------ Peak A vel 79.9 cm/s  ------ Mean vel, 95.4 cm/s ------ D Decelerati 243 ms 150-23 on time 0 Pressure 82 ms ------ half-time Mean 5 mm Hg ------ gradient, D Peak 15 mm Hg ------ gradient, D Peak E/A 2.7 ------ ratio Area (PHT) 2.68 cm^2 ------ Area index 1.35 cm^2/m ------ (PHT) ^2 Annulus 55.3 cm ------ VTI Tricuspid valve Regurg 324 cm/s ------ peak vel Peak RV-RA 42 mm Hg ------ gradient, S Max regurg 324 cm/s ------ vel Systemic veins Estimated 3 mm Hg ------ CVP Right ventricle Pressure, 45 mm Hg <30 S Pulmonic valve Regurg 153 cm/s ------ vel, ED  ------------------------------------------------------------ Prepared and Electronically Authenticated by  [name], [name] [id number]:[id number]   CARDIAC CATHETERIZATION REPORT  Procedures performed:  1. Left heart catheterization  2. Selective coronary angiography  3. Left ventriculography  Reason for procedure:  CAD status post CABG  Congestive heart failure  Valvular heart disease:  Procedure performed by: MiSanda KleinMD, FACleveland-Wade Park Va Medical CenterComplications: none  Estimated blood loss: less than 5 mL  History: He is here to undergo radical left heart catheterization to clarify the severity of aortic stenosis and possible need for referral to TAVR.  Less than 2 months have passed since Mr. BaGeraldsast appointment and he now has noticed progressively worsening exertional dyspnea. Previously asymptomatic, he now  appears to have NYHA class II dyspnea on exertion. Denies chest pain or syncope with activity.  His cardiac problems date back to 2009 when he was diagnosed with severe mitral insufficiency related to mitral valve prolapse. He was also having problems with paroxysmal atrial fibrillation. Coronary angiography demonstrated moderate coronary artery disease primarily a 70% calcified eccentric proximal LAD lesion with moderate stenoses in the other 2 major coronary arteries. He was referred for robotic mitral valve repair by Dr.  Amador Cunas at [facility]. The LIMA was found to be a very small vessel. He received a saphenous vein graft bypass to the LAD. Cryoablation was performed for atrial fibrillation. The mitral valve was replaced with a 29 mm St. Jude Biocor prosthesis. The postoperative course was complicated by an embolic stroke from which she has recovered without sequelae. He received a dual-chamber permanent pacemaker (Medtronic adapta) and is pacemaker dependent with 100% pacing in both the atrium and the ventricle.  He has remained on amiodarone therapy with an extremely low burden of atrial fibrillation. As far as I can tell from the record the last documentation of atrial fibrillation was a one-hour episode that occurred in May of 2012. He also remains on warfarin therapy.  Subsequently, he has developed progressive aortic valve disease and has at least moderate aortic stenosis with mild to moderate aortic insufficiency. There is some discrepancy between the estimated aortic valve area at 0.7 cm and the aortic gradients (mean 29 mm Hg). Biological mitral valve prosthesis shows mild stenosis with a mean gradient of 7 mm Hg (heart rate not specified). He has a severely dilated left atrium and mild tricuspid insufficiency.  Left ventricular systolic function is mildly depressed with an EF of 45-50% primarily due to RV pacing induced asynchrony.  Consent: The risks, benefits, and details of the procedure were explained to the patient. Risks including death, MI, stroke, bleeding, limb ischemia, renal failure and allergy were described and accepted by the patient. Informed written consent was obtained prior to proceeding.  Technique: The patient was brought to the cardiac catheterization laboratory in the fasting state. He was prepped and draped in the usual sterile fashion. Local anesthesia with 1% lidocaine was administered to the right groin area. Using the modified Seldinger technique a 5 French right common femoral artery sheath was  introduced without difficulty. Under fluoroscopic guidance, using 5 [place] JL4, JR and angled pigtail catheters, selective cannulation of the left coronary artery, right coronary artery and left ventricle were respectively performed. Several coronary angiograms in a variety of projections were recorded, as well as a left ventriculogram in the RAO projection. Left ventricular pressure and a pull back to the aorta were recorded. No immediate complications occurred. At the end of the procedure, all catheters were removed. After the procedure, hemostasis will be achieved with manual pressure.  Contrast used: 150 mL Omnipaque  Hemodynamic findings:  Aortic pressure 130/70 (mean 96) mm Hg  Left ventricle 073/71 with end-diastolic pressure of 21 mm Hg  PA wedge pressure a wave 27, v wave 40 (mean 31) mm Hg  Pulmonary artery 56/33 (mean 43) mm Hg  Right ventricle 58/13 with an end-diastolic pressure of 19 mm Hg  Right atrium a wave 18, v wave 20 (mean 18) mm Hg  Cardiac output is 4.1 L per minute (cardiac index 2.1 L per minute per meter sq), with similar results by thermodilution and Fick methods  Aortic valve area by Gorlin equation 0.98 cm square (index 0.5 cm square/meter squared BSA)  Aortic  valve mean gradient 22 mm Hg  Mitral valve area by Gorlin equation 1.2 cm square (0.58 cm square index for body surface area)  Mitral valve mean gradient 13.7 mm Hg at a heart rate of 72 beats per minute - prosthetic valve  Angiographic Findings:  1. The left main coronary artery is free of significant atherosclerosis and bifurcates in the usual fashion into the left anterior descending artery and left circumflex coronary artery.  2. The left anterior descending artery is a large vessel that reaches the apex and generates twomajor diagonal branches. There is evidence of extensve luminal irregularities and severe calcification. There is a severe eccentric (probably >90%) ostial stenosis of the LAD artery with a large  eccentric calcified plaque.  No competitive flow is seen in the distal LAD artery, suggesting graft occlusion.  3. The left circumflex coronary artery is a large-size vessel non- dominant vessel that generates 2 major oblque marginal arteries. There is evidence of extensive luminal irregularities and heavy calcification. No hemodynamically meaningful stenoses are seen.  4. The right coronary artery is a large-size dominant vessel that generates a long posterior lateral ventricular system as well as the PDA. There is evidence of extensive luminal irregularities and the entire length of the vessel is heavily calcified. A 60-70% stenosis is seen in the mid RCA, at the acute margin with an exophytic heavily calcified plaque  5. The left ventricle was not injected There is moderate aortic valve stenosis by pullback. The left ventricular end-diastolic pressure is mildly elevated.  6. the ascending aorta is normal in caliber but is very heavily calcified. No evidence of previously placed saphenous vein graft filling the during ascending aortogram   IMPRESSIONS:  1. moderate aortic stenosis  2. moderate mitral stenosis (iatrogenic status post mitral valve replacement)  3. extensive coronary disease involving both native and graft vessels - interval occlusion of the saphenous vein graft bypass to the LAD artery with progression of the ostial LAD stenosis, which is now critical  RECOMMENDATION:  Mr. Mangas requires revascularization in the LAD artery territory. The stenosis is ostial and heavily calcified, therefore challenging for treatment with angioplasty and stent. He also has moderate aortic valve stenosis and will require treatment for this in the future. This would make him a candidate for surgical treatment.  Unfortunately the presence of heavy calcification of the ascending aorta, advanced age and history of prior sternotomy placement of a high risk for surgical complications.  The images were reviewed  with Dr. Burt Knack and various options for treatment were discussed. The patient will followup with Dr. Burt Knack in the office next week to discuss a high risk percutaneous intervention to the proximal LAD artery. Ideally he'll receive a drug-eluting stent. Consideration will be given to aortic valve replacement with a stent graft after 6-12 months of dual antiplatelet therapy.     CARDIAC CATH NOTE  Name: [name] [name]  MRN: [id number]  DOB: [birth year]  Procedure: IVUS, rotational atherectomy, PTCA and stenting of the proximal LAD  Indication: Severe proximal LAD stenosis. 64 -year-old gentleman with known CAD and previous mitral valve replacement/single vessel CABG has been undergoing evaluation because of progressive exertional dyspnea. He was found to have severe proximal LAD stenosis with an occluded vein graft to the LAD. The patient also has severe aortic stenosis. After careful discussion and extensive evaluation, we have elected to proceed with PCI of the proximal LAD using rotational atherectomy. The patient is also being considered for transcatheter aortic valve replacement. His LAD  stenosis requires treatment not only for symptom relief, but also to allow for TAVR to be performed. The patient has been evaluated by cardiac surgery and he is not a candidate for redo sternotomy.  Procedural Details: The right groin was prepped, draped, and anesthetized with 1% lidocaine. Using the modified Seldinger technique, a 6 Fr sheath was introduced into the right femoral artery. Weight-based bivalirudin was given for anticoagulation. Once a therapeutic ACT was achieved, a 6 [place] CLS-4.0 guide catheter was inserted. Initially, the angiographic appearance of the ostial LAD looked less severe than the previous study. I suspected this was due to better opacification with a guide catheter. I elected to perform IVUS to be certain the lesion was severely diseased. A cougar coronary guidewire was used to cross the  lesion. IC NTG was administered. An IVUS probe was advanced across the lesion and a mechanical pullback was performed. This confirmed heavy calcification and very severe stenosis back to the ostium of the LAD. The plaque was occlusive with the IVUS catheter. We then set up for rotational atherectomy. A Rotafloppy wire was advanced across the lesion into the mid LAD. A 1.5 mm burr was platformed outside the body at 165,000 rpm. The verbal was then advanced through the guide catheter and a total of 3 runs were made to treat the lesion. Following rotational atherectomy, The lesion was predilated with a 2.75 mm cutting balloon. The lesion was then stented with a 3.0 x 8 mm Xience Alpine drug-eluting stent. I tried to advance the IVUS catheter for post-procedural IVUS but it would not advance far enough to evaluate stent apposition. The stent was postdilated with a 3.25 mm noncompliant balloon to 16 atm. Following PCI, there was 0% residual stenosis and TIMI-3 flow. Final angiography confirmed an excellent result. The patient tolerated the procedure well. There were no immediate procedural complications. Femoral hemostasis will be achieved with manual pressure. The patient was transferred to the post catheterization recovery area for further monitoring.  Lesion Data:  Vessel: LAD/proximal (ostial)  Percent stenosis (pre): 80  TIMI-flow (pre): 3  Stent: 3.0x8 mm Xience Alpine DES  Percent stenosis (post): 0  TIMI-flow (post): 3  Conclusions: Successful PCI of the proximal LAD with a DES using adjunctive rotational atherectomy.  Recommendations: DAPT with ASA/Plavix x 12 months, continued evaluation by multidisciplinary valve team for consideration of TAVR.  [name]  03/24/2014, 9:26 AM   Josue Hector, MD Ludwig Clarks Feb 21, 2014 5:39:17 PM EDT       ADDENDUM REPORT: 02/21/2014 17:36  CLINICAL DATA: Aortic stenosis  EXAM:  Cardiac TAVR CT  TECHNIQUE:  The patient was scanned on a Philips 256 scanner.  A 120 kV  retrospective scan was triggered in the descending thoracic aorta at  111 HU's. Gantry rotation speed was 270 msecs and collimation was .9  mm. No beta blockade or nitro were given. The 3D data set was  reconstructed in 5% intervals of the R-R cycle. Systolic and  diastolic phases were analyzed on a dedicated work station using  MPR, MIP and VRT modes. The patient received 80 cc of contrast.  FINDINGS:  Aortic Valve: Trileaflet and all 3 leaflets heavily calcified  especially the non coronary cusp. There is extensive calcification  of the sinotubular junction as well as the annulus extending into  the  Anterior mitral leaflet  Sinotubular Junction: 3.4 cm  Ascending Thoracic Aorta: 4.5 cm with extensive calcification over  the greater curvature  Descending Thoracic Aorta: 2.4 cm tortuous  with scattered  calcifications  Sinus of Valsalva Measurements:  Non-coronary: 33.4 mm  Right -coronary: 33.7 cm  Left -coronary: 34.8 cm  Coronary Artery Height above Annulus:  Left Main: 12.6 mm  Right Coronary: 13.5 mm  Virtual Basal Annulus Measurements:  Maximum/Minimum Diameter: 29.7/21.4 at 35% phase  Perimeter: 85.4 mm  Area: 570 mm2  Optimum Fluoroscopic Angle for Delivery: LAO 21 degrees Cranial 0  degrees Alternatively AP with 22 degrees Caudal  IMPRESSION:  1) Severely calcified trileaflet aortic valve suitable for 29 mm  Sapien XT valve. Unfavorable calcification at the sinotubular  junction and annulus which extends through the anterior mitral  leaflet  2) Mild aortic root dilatation with severe calcification of the  anterior curvature  3) Coronary Artery Heights suitable for TAVR  4) Optimal Angiographic delivery angle LAO 21 degrees Cranial 0, or  AP with 22 degrees Caudal angulation  5) Huge Hiatal hernia also involving the transverse colon which  would preclude TEE guidance of TAVR procedure  Jenkins Rouge  Electronically Signed  By: Jenkins Rouge M.D.  On:  02/21/2014 17:36       Study Result    EXAM:  OVER-READ INTERPRETATION CT CHEST  The following report is an over-read performed by radiologist Dr.  Rebekah [facility] on 02/21/2014. This  over-read does not include interpretation of cardiac or coronary  anatomy or pathology. The coronary calcium score/coronary CTA  interpretation by the cardiologist is attached.  COMPARISON: CTA of the chest, abdomen and pelvis 02/21/2014.  FINDINGS:  No significant change from contemporaneously obtained CTA of the  chest, abdomen and pelvis from 02/21/2014.  IMPRESSION:  1. Please see report for contemporaneously performed CTA of the  chest, abdomen and pelvis for full description of extracardiac  findings. The most relevant finding for potential TAVR procedure is  the patient's massive hiatal hernia which contains both the stomach  (which is predominantly intrathoracic), as well as nearly the entire  transverse colon. Notably, the transverse colon is interposed  between the stomach and the left atrium which may have implications  for potential intraprocedural transesophageal echocardiography at  time of TAVR procedure.  Electronically Signed:  By: Vinnie Langton M.D.  On: 02/21/2014 16:07    CLINICAL DATA: 80 year old male with history of severe aortic  stenosis. Preoperative evaluation prior to potential transcatheter  aortic valve replacement (TAVR).  EXAM:  CT ANGIOGRAPHY CHEST, ABDOMEN AND PELVIS  TECHNIQUE:  Multidetector CT imaging through the chest, abdomen and pelvis was  performed using the standard protocol during bolus administration of  intravenous contrast. Multiplanar reconstructed images and MIPs were  obtained and reviewed to evaluate the vascular anatomy.  CONTRAST: 70 mL of Omnipaque 350.  COMPARISON: CT of the abdomen and pelvis 11/29/2011. CT of the  chest 07/12/2004.  FINDINGS:  CT CHEST FINDINGS  Mediastinum: Heart size is mildly enlarged.  There is no significant  pericardial fluid, thickening or pericardial calcification. There is  atherosclerosis of the thoracic aorta, the great vessels of the  mediastinum and the coronary arteries, including calcified  atherosclerotic plaque in the left main, left anterior descending,  left circumflex and right coronary arteries. Extensive  calcifications of the aortic valve and mitral annulus. Status post  mitral valve replacement with bioprosthetic mitral valve. No  pathologically enlarged mediastinal or hilar lymph nodes. Massive  hiatal hernia with predominantly intrathoracic stomach, as well as a  long segment of the transverse colon which is completely intra  thoracic.  Lungs/Pleura: Small bilateral pleural  effusions (right greater than  left) layering dependently. These are simple in appearance. Some  associated passive atelectasis in the lower lobes of the lungs  bilaterally (left greater than right). Focus of atelectasis and/or  scarring in the periphery of the inferior segment of the lingula. No  acute consolidative airspace disease. No definite suspicious  appearing pulmonary nodules or masses.  Musculoskeletal: Median sternotomy wires. There are no aggressive  appearing lytic or blastic lesions noted in the visualized portions  of the skeleton. Multiple old healed anterior left-sided rib  fractures.  CT ABDOMEN AND PELVIS FINDINGS  Abdomen/Pelvis: The liver has a slightly shrunken and nodular  appearance, suggestive of early cirrhosis. Numerous small calcified  gallstones are noted lying dependently in the gallbladder. No  current findings to suggest acute cholecystitis at this time. The  appearance of the pancreas, spleen and bilateral adrenal glands is  unremarkable. Multiple sub cm low-attenuation lesions are noted  throughout the kidneys bilaterally, too small to definitively  characterize, but favored to represent small cysts. Several larger  well-defined  low-intermediate attenuation lesions in the left kidney  are compatible with a combination of simple and mildly proteinaceous  cysts, with the largest lesion measuring up to 1.9 x 3.1 cm in the  left renal sinus.  Trace volume of ascites. Extensive colonic diverticulosis without  findings to suggest acute diverticulitis at this time. Nearly the  entire transverse colon is intrathoracic within the patient's large  hiatal hernia. Normal appendix. No pneumoperitoneum. No pathologic  distention of small bowel. Small bilateral inguinal hernias. The  left inguinal hernia contains predominantly fat, while the right  inguinal hernia contains a short loop of distal small bowel without  evidence of bowel incarceration or obstruction at this time.  Prostate gland appears enlarged and heterogeneous. Multiple tiny  bladder wall diverticulae are incidentally noted.  Musculoskeletal: Multilevel degenerative disc disease in the lumbar  spine with multiple Schmorl's nodes. There are no aggressive  appearing lytic or blastic lesions noted in the visualized portions  of the skeleton.  VASCULAR MEASUREMENTS PERTINENT TO TAVR:  AORTA:  Minimal Aortic Diameter - 16 x 14 mm mm  Severity of Aortic Calcification - Severe  RIGHT PELVIS:  Right Common Iliac Artery -  Minimal Diameter - 10.4 x 10.8 mm  Tortuosity - Moderate  Calcification - Severe  Right External Iliac Artery -  Minimal Diameter - 10.1 x 10.3 mm  Tortuosity - Moderate  Calcification - Mild to moderate  Right Common Femoral Artery -  Minimal Diameter - 7.9 x 6.5 mm  Tortuosity - Mild  Calcification - Severe  LEFT PELVIS:  Left Common Iliac Artery -  Minimal Diameter - 11.4 x 12.1 mm  Tortuosity - Moderate  Calcification - Severe  Left External Iliac Artery -  Minimal Diameter - 7.3 x 12.1 mm  Tortuosity - Mild to moderate  Calcification - Moderate  Left Common Femoral Artery -  Minimal Diameter - 6.6 x 8.0 mm  Tortuosity - Mild   Calcification - Severe  Review of the MIP images confirms the above findings.  IMPRESSION:  1. Findings and measurements pertinent to potential TAVR procedure,  as detailed above. Although the patient does appear to have suitable  pelvic arterial access, the patient's common femoral arteries are  severely calcified bilaterally (right greater than left).  2. Massive hiatal hernia with predominantly intrathoracic stomach,  as well as a large portion of the transverse colon which is  intrathoracic located between stomach and the adjacent  left atrium.  This may have implications for intraprocedural transesophageal  echocardiography.  3. Cholelithiasis without evidence to suggest acute cholecystitis at  this time.  4. Evidence of early cirrhosis.  5. Colonic diverticulosis without findings to suggest acute  diverticulitis at this time.  6. Small right inguinal hernia containing a short segment of distal  small bowel, without evidence of incarceration or obstruction at  this time.  7. Additional incidental findings, as above.  Electronically Signed  By: Vinnie Langton M.D.  On: 02/21/2014 15:23       STS Risk Calculator  Procedure AVR + redo CABG (without redo MVR)  Risk of Mortality 5.1%  Morbidity or Mortality 24.9%  Prolonged LOS 12.8%  Short LOS 23.0%  Permanent Stroke 3.1%  Prolonged Vent Support 15.4%  DSW Infection 0.3%  Renal Failure 6.2%  Reoperation 10.4%      Impression:  The patient has symptomatic aortic stenosis that is at least moderate and probably severe with NYHA class III symptoms of exertional shortness of breath. He has severe two-vessel coronary artery disease that has been treated with PCI and mild to moderate LV dysfunction. The patient's aortic valve is tricuspid with severe calcification and restricted leaflet mobility involving all 3 leaflets. The peak velocity across the aortic valve measured less than 4 m/s and mean transvalvular gradient was less  than 40 mm mercury both by echocardiogram and catheterization but I suspect his aortic stenosis is severe. The patient has at least mild mitral stenosis with a bioprosthetic tissue valve in the mitral position which likely limits flow, and left ventricular function is mild to moderately reduced. He is scheduled for a dobutamine stress echo to be sure that he has severe AS. I think aortic valve replacement is indicated but he is not a candidate for conventional open AVR due to his very complicated first cardiac surgery, hostile mediastinum and heart, and significant calcification of the ascending aorta. I think TAVR is a reasonable alternative for this gentleman. I reviewed the operative procedure with him and his wife again and answered their questions. They understand that he is not a candidate for an open cardiac procedure and if he should have some intraoperative complication that could only be approached via sternotomy that we would not perform that and that he may not survive..   Plan:  TAVR via right transfemoral approach on 04/22/2014 by Dr. Roxy Manns and Dr. Burt Knack.    Gaye Pollack, MD 04/09/2014

## 2014-04-11 NOTE — Progress Notes (Signed)
Dobutamine echo performed today. Pt tolerated well. HR max was 112 at 30 ug of Dobutamine.(116 was APMHR). Tests stopped secondary to frequent ectopy.   [name] PA-C 04/11/2014 12:15 PM

## 2014-04-11 NOTE — Telephone Encounter (Signed)
Closed encounter °

## 2014-04-16 ENCOUNTER — Encounter (HOSPITAL_COMMUNITY): Payer: Self-pay | Admitting: Pharmacy Technician

## 2014-04-18 ENCOUNTER — Encounter (HOSPITAL_COMMUNITY)
Admission: RE | Admit: 2014-04-18 | Discharge: 2014-04-18 | Disposition: A | Discharge status: Home / Self Care | Payer: Medicare Other | Source: Ambulatory Visit | Attending: Cardiovascular Disease | Admitting: Cardiovascular Disease

## 2014-04-18 ENCOUNTER — Telehealth: Payer: Self-pay | Admitting: Cardiovascular Disease

## 2014-04-18 ENCOUNTER — Encounter (HOSPITAL_COMMUNITY): Payer: Self-pay

## 2014-04-18 ENCOUNTER — Ambulatory Visit: Payer: Medicare Other | Admitting: Cardiology

## 2014-04-18 VITALS — BP 102/62 | HR 76 | Temp 97.5°F | Resp 18 | Ht 70.0 in | Wt 174.4 lb

## 2014-04-18 DIAGNOSIS — Z01810 Encounter for preprocedural cardiovascular examination: Secondary | ICD-10-CM | POA: Insufficient documentation

## 2014-04-18 DIAGNOSIS — I359 Nonrheumatic aortic valve disorder, unspecified: Secondary | ICD-10-CM

## 2014-04-18 DIAGNOSIS — Z01812 Encounter for preprocedural laboratory examination: Secondary | ICD-10-CM | POA: Insufficient documentation

## 2014-04-18 LAB — HEMOGLOBIN A1C
Hgb A1c MFr Bld: 5.6 % (ref <5.7)
Mean Plasma Glucose: 114 mg/dL (ref <117)

## 2014-04-18 LAB — COMPREHENSIVE METABOLIC PANEL
ALBUMIN: 3.6 g/dL (ref 3.5–5.2)
ALT: 12 U/L (ref 0–53)
AST: 17 U/L (ref 0–37)
Alkaline Phosphatase: 82 U/L (ref 39–117)
BUN: 24 mg/dL — ABNORMAL HIGH (ref 6–23)
CALCIUM: 9.2 mg/dL (ref 8.4–10.5)
CO2: 22 meq/L (ref 19–32)
Chloride: 108 mEq/L (ref 96–112)
Creatinine, Ser: 0.78 mg/dL (ref 0.50–1.35)
GFR calc Af Amer: >90 mL/min (ref >90)
GFR, EST NON AFRICAN AMERICAN: 81 mL/min — AB (ref >90)
Glucose, Bld: 92 mg/dL (ref 70–99)
Potassium: 4.5 mEq/L (ref 3.7–5.3)
SODIUM: 144 meq/L (ref 137–147)
Total Bilirubin: 0.5 mg/dL (ref 0.3–1.2)
Total Protein: 6.7 g/dL (ref 6.0–8.3)

## 2014-04-18 LAB — CBC
HCT: 37.6 % — ABNORMAL LOW (ref 39.0–52.0)
Hemoglobin: 12.4 g/dL — ABNORMAL LOW (ref 13.0–17.0)
MCH: 30.9 pg (ref 26.0–34.0)
MCHC: 33 g/dL (ref 30.0–36.0)
MCV: 93.8 fL (ref 78.0–100.0)
PLATELETS: 116 10*3/uL — AB (ref 150–400)
RBC: 4.01 MIL/uL — ABNORMAL LOW (ref 4.22–5.81)
RDW: 14.1 % (ref 11.5–15.5)
WBC: 5.6 10*3/uL (ref 4.0–10.5)

## 2014-04-18 LAB — BLOOD GAS, ARTERIAL
Acid-Base Excess: 1.1 mmol/L (ref 0.0–2.0)
Bicarbonate: 25.3 mEq/L — ABNORMAL HIGH (ref 20.0–24.0)
DRAWN BY: 206361
FIO2: 0.21 %
O2 SAT: 97.3 %
PCO2 ART: 41.2 mmHg (ref 35.0–45.0)
Patient temperature: 98.6
TCO2: 26.6 mmol/L (ref 0–100)
pH, Arterial: 7.405 (ref 7.350–7.450)
pO2, Arterial: 92.9 mmHg (ref 80.0–100.0)

## 2014-04-18 LAB — URINALYSIS, ROUTINE W REFLEX MICROSCOPIC
Bilirubin Urine: NEGATIVE
Glucose, UA: NEGATIVE mg/dL
Hgb urine dipstick: NEGATIVE
Ketones, ur: NEGATIVE mg/dL
NITRITE: NEGATIVE
Protein, ur: NEGATIVE mg/dL
SPECIFIC GRAVITY, URINE: 1.024 (ref 1.005–1.030)
Urobilinogen, UA: 1 mg/dL (ref 0.0–1.0)
pH: 5.5 (ref 5.0–8.0)

## 2014-04-18 LAB — SURGICAL PCR SCREEN
MRSA, PCR: NEGATIVE
STAPHYLOCOCCUS AUREUS: NEGATIVE

## 2014-04-18 LAB — PROTIME-INR
INR: 1.16 (ref 0.00–1.49)
Prothrombin Time: 14.6 seconds (ref 11.6–15.2)

## 2014-04-18 LAB — URINE MICROSCOPIC-ADD ON

## 2014-04-18 LAB — APTT: aPTT: 36 seconds (ref 24–37)

## 2014-04-18 LAB — ABO/RH: ABO/RH(D): B NEG

## 2014-04-18 NOTE — Telephone Encounter (Signed)
New message    [facility] at [facility] pre-admission . Please call patient to advise to continue or stop    Does patient need to continue plavix up to day of procedure.

## 2014-04-18 NOTE — Progress Notes (Signed)
Peri-operative Implanted device orders faxed to [facility].  [employer] notified of date and time of patient surgery.

## 2014-04-18 NOTE — Pre-Procedure Instructions (Signed)
[name] [name]  04/18/2014   Your procedure is scheduled on: 04-22-2014  Tuesday   Report to [facility] Admitting at 5:30 AM.   Call this number if you have problems the morning of surgery: [phone]   Remember:   Do not eat food or drink liquids after midnight.    Take these medicines the morning of surgery with A SIP OF WATER: Amiodarone(Pacerone),metoprolol(Toprol XL)protonix,tamsulosin(Flomax)   Do not wear jewelry  Do not wear lotions, powders, or perfumes.   Do not shave 48 hours prior to surgery. Men may shave face and neck.  Do not bring valuables to the hospital.  [facility] is not responsible  for any belongings or valuables.               Contacts, dentures or bridgework may not be worn into surgery.   Leave suitcase in the car. After surgery it may be brought to your room.  For patients admitted to the hospital, discharge time is determined by your   treatment team.                  Special Instructions: See attached sheet for CHG shower/bath   Please read over the following fact sheets that you were given: Pain Booklet, Coughing and Deep Breathing, Blood Transfusion Information and Surgical Site Infection Prevention

## 2014-04-18 NOTE — Telephone Encounter (Signed)
I spoke with [name] [name] and made her aware that the pt does need to remain on Plavix prior to TAVR.  The pt is still with [name] [name] and she will make the pt aware to remain on plavix.

## 2014-04-21 MED ORDER — METOPROLOL TARTRATE 12.5 MG HALF TABLET: 12.5000 mg | ORAL_TABLET | Freq: Once | ORAL | Status: DC

## 2014-04-21 NOTE — Anesthesia Preprocedure Evaluation (Addendum)
Anesthesia Evaluation  Patient identified by MRN, date of birth, ID band Patient awake    Reviewed: Allergy & Precautions, H&P , NPO status , Patient's Chart, lab work & pertinent test results, reviewed documented beta blocker date and time   History of Anesthesia Complications Negative for: history of anesthetic complications  Airway Mallampati: II TM Distance: >3 FB Neck ROM: Full    Dental  (+) Teeth Intact   Pulmonary shortness of breath, former smoker (quit '67),  breath sounds clear to auscultation  Pulmonary exam normal       Cardiovascular hypertension, Pt. on medications and Pt. on home beta blockers + Cardiac Stents (DES to LAD), + CABG ('09 CABG x1), + Peripheral Vascular Disease and +CHF + dysrhythmias Atrial Fibrillation + pacemaker + Valvular Problems/Murmurs (S/P MVR) AS Rhythm:Regular Rate:Normal + Systolic murmurs 4/43 ECHO:  EF 45-50%, Aortic valve:   Severely thickened, severely calcified leaflets. Valve mobility was severely restricted.  Doppler:  Mild regurgitation.    Valve area: 0.76cm^2(VTI). Indexed valve area: 0.38cm^2/m^2 (VTI). Peak velocity ratio of LVOT to aortic valve: 0.21. Valve area: 0.74cm^2 (Vmax). Indexed valve area: 0.37cm^2/m^2 (Vmax).    Mean gradient: 78mm Hg (S). Peak gradient: 103mm Hg (S).    Neuro/Psych Anxiety CVA    GI/Hepatic Neg liver ROS, GERD-  Medicated and Controlled,Barrett's esophagus   Endo/Other  Hyperthyroidism   Renal/GU negative Renal ROS     Musculoskeletal   Abdominal Normal abdominal exam  (+)   Peds  Hematology  (+) anemia ,   Anesthesia Other Findings   Reproductive/Obstetrics                       Anesthesia Physical Anesthesia Plan  ASA: IV  Anesthesia Plan: General   Post-op Pain Management:    Induction: Intravenous  Airway Management Planned: Oral ETT  Additional Equipment: Arterial line, CVP, PA Cath, 3D TEE and  Ultrasound Guidance Line Placement  Intra-op Plan:   Post-operative Plan: Possible Post-op intubation/ventilation  Informed Consent: I have reviewed the patients History and Physical, chart, labs and discussed the procedure including the risks, benefits and alternatives for the proposed anesthesia with the patient or authorized representative who has indicated his/her understanding and acceptance.   Dental advisory given  Plan Discussed with: CRNA, Anesthesiologist and Surgeon  Anesthesia Plan Comments: (Plan routine monitors, A line, PA cath, GETA with TEE and possible post op ventilation)       Anesthesia Quick Evaluation

## 2014-04-22 ENCOUNTER — Encounter (HOSPITAL_COMMUNITY)
Admission: RE | Disposition: A | Discharge status: Home / Self Care | Payer: Medicare Other | Source: Ambulatory Visit | Attending: Cardiovascular Disease

## 2014-04-22 ENCOUNTER — Encounter (HOSPITAL_COMMUNITY): Payer: Medicare Other | Admitting: Anesthesiology

## 2014-04-22 ENCOUNTER — Inpatient Hospital Stay (HOSPITAL_COMMUNITY): Payer: Medicare Other | Admitting: Anesthesiology

## 2014-04-22 ENCOUNTER — Inpatient Hospital Stay (HOSPITAL_COMMUNITY)
Admission: RE | Admit: 2014-04-22 | Discharge: 2014-04-24 | DRG: 267 | Disposition: A | Discharge status: Home / Self Care | Payer: Medicare Other | Source: Ambulatory Visit | Attending: Cardiovascular Disease | Admitting: Cardiovascular Disease

## 2014-04-22 ENCOUNTER — Inpatient Hospital Stay (HOSPITAL_COMMUNITY): Payer: Medicare Other

## 2014-04-22 ENCOUNTER — Encounter (HOSPITAL_COMMUNITY): Payer: Self-pay | Admitting: Anesthesiology

## 2014-04-22 DIAGNOSIS — J9819 Other pulmonary collapse: Secondary | ICD-10-CM | POA: Diagnosis not present

## 2014-04-22 DIAGNOSIS — Y849 Medical procedure, unspecified as the cause of abnormal reaction of the patient, or of later complication, without mention of misadventure at the time of the procedure: Secondary | ICD-10-CM | POA: Diagnosis present

## 2014-04-22 DIAGNOSIS — I739 Peripheral vascular disease, unspecified: Secondary | ICD-10-CM | POA: Diagnosis present

## 2014-04-22 DIAGNOSIS — Z006 Encounter for examination for normal comparison and control in clinical research program: Secondary | ICD-10-CM

## 2014-04-22 DIAGNOSIS — I2582 Chronic total occlusion of coronary artery: Secondary | ICD-10-CM | POA: Diagnosis present

## 2014-04-22 DIAGNOSIS — Z9861 Coronary angioplasty status: Secondary | ICD-10-CM

## 2014-04-22 DIAGNOSIS — I359 Nonrheumatic aortic valve disorder, unspecified: Principal | ICD-10-CM | POA: Diagnosis present

## 2014-04-22 DIAGNOSIS — I2510 Atherosclerotic heart disease of native coronary artery without angina pectoris: Secondary | ICD-10-CM | POA: Diagnosis present

## 2014-04-22 DIAGNOSIS — D696 Thrombocytopenia, unspecified: Secondary | ICD-10-CM | POA: Diagnosis not present

## 2014-04-22 DIAGNOSIS — I509 Heart failure, unspecified: Secondary | ICD-10-CM | POA: Diagnosis present

## 2014-04-22 DIAGNOSIS — Z803 Family history of malignant neoplasm of breast: Secondary | ICD-10-CM

## 2014-04-22 DIAGNOSIS — G2581 Restless legs syndrome: Secondary | ICD-10-CM | POA: Diagnosis present

## 2014-04-22 DIAGNOSIS — Z8546 Personal history of malignant neoplasm of prostate: Secondary | ICD-10-CM

## 2014-04-22 DIAGNOSIS — Z8673 Personal history of transient ischemic attack (TIA), and cerebral infarction without residual deficits: Secondary | ICD-10-CM

## 2014-04-22 DIAGNOSIS — D62 Acute posthemorrhagic anemia: Secondary | ICD-10-CM | POA: Diagnosis not present

## 2014-04-22 DIAGNOSIS — Z7902 Long term (current) use of antithrombotics/antiplatelets: Secondary | ICD-10-CM

## 2014-04-22 DIAGNOSIS — Z87891 Personal history of nicotine dependence: Secondary | ICD-10-CM

## 2014-04-22 DIAGNOSIS — E0590 Thyrotoxicosis, unspecified without thyrotoxic crisis or storm: Secondary | ICD-10-CM | POA: Diagnosis present

## 2014-04-22 DIAGNOSIS — I700 Atherosclerosis of aorta: Secondary | ICD-10-CM | POA: Diagnosis present

## 2014-04-22 DIAGNOSIS — I10 Essential (primary) hypertension: Secondary | ICD-10-CM | POA: Diagnosis present

## 2014-04-22 DIAGNOSIS — E785 Hyperlipidemia, unspecified: Secondary | ICD-10-CM | POA: Diagnosis present

## 2014-04-22 DIAGNOSIS — Z7982 Long term (current) use of aspirin: Secondary | ICD-10-CM

## 2014-04-22 DIAGNOSIS — K449 Diaphragmatic hernia without obstruction or gangrene: Secondary | ICD-10-CM | POA: Diagnosis present

## 2014-04-22 DIAGNOSIS — Z952 Presence of prosthetic heart valve: Secondary | ICD-10-CM

## 2014-04-22 DIAGNOSIS — T82897A Other specified complication of cardiac prosthetic devices, implants and grafts, initial encounter: Secondary | ICD-10-CM | POA: Diagnosis present

## 2014-04-22 HISTORY — DX: Presence of prosthetic heart valve: Z95.2

## 2014-04-22 HISTORY — PX: INTRAOPERATIVE TRANSESOPHAGEAL ECHOCARDIOGRAM: SHX5062

## 2014-04-22 HISTORY — PX: TRANSCATHETER AORTIC VALVE REPLACEMENT, TRANSFEMORAL: SHX6400

## 2014-04-22 LAB — APTT: APTT: 36 s (ref 24–37)

## 2014-04-22 LAB — POCT I-STAT, CHEM 8
BUN: 19 mg/dL (ref 6–23)
CALCIUM ION: 1.2 mmol/L (ref 1.13–1.30)
Chloride: 108 mEq/L (ref 96–112)
Creatinine, Ser: 0.8 mg/dL (ref 0.50–1.35)
GLUCOSE: 110 mg/dL — AB (ref 70–99)
HCT: 30 % — ABNORMAL LOW (ref 39.0–52.0)
Hemoglobin: 10.2 g/dL — ABNORMAL LOW (ref 13.0–17.0)
Potassium: 4 mEq/L (ref 3.7–5.3)
Sodium: 142 mEq/L (ref 137–147)
TCO2: 24 mmol/L (ref 0–100)

## 2014-04-22 LAB — POCT I-STAT 3, ART BLOOD GAS (G3+)
ACID-BASE DEFICIT: 1 mmol/L (ref 0.0–2.0)
Bicarbonate: 24.4 mEq/L — ABNORMAL HIGH (ref 20.0–24.0)
O2 Saturation: 98 %
PH ART: 7.39 (ref 7.350–7.450)
TCO2: 26 mmol/L (ref 0–100)
pCO2 arterial: 40.1 mmHg (ref 35.0–45.0)
pO2, Arterial: 107 mmHg — ABNORMAL HIGH (ref 80.0–100.0)

## 2014-04-22 LAB — PROTIME-INR
INR: 1.36 (ref 0.00–1.49)
Prothrombin Time: 16.4 seconds — ABNORMAL HIGH (ref 11.6–15.2)

## 2014-04-22 LAB — CBC
HCT: 32.5 % — ABNORMAL LOW (ref 39.0–52.0)
HEMATOCRIT: 32.5 % — AB (ref 39.0–52.0)
HEMOGLOBIN: 10.8 g/dL — AB (ref 13.0–17.0)
Hemoglobin: 10.5 g/dL — ABNORMAL LOW (ref 13.0–17.0)
MCH: 30.3 pg (ref 26.0–34.0)
MCH: 31.8 pg (ref 26.0–34.0)
MCHC: 32.3 g/dL (ref 30.0–36.0)
MCHC: 33.2 g/dL (ref 30.0–36.0)
MCV: 93.9 fL (ref 78.0–100.0)
MCV: 95.6 fL (ref 78.0–100.0)
PLATELETS: 89 10*3/uL — AB (ref 150–400)
Platelets: 87 10*3/uL — ABNORMAL LOW (ref 150–400)
RBC: 3.46 MIL/uL — AB (ref 4.22–5.81)
RBC: 3.4 MIL/uL — ABNORMAL LOW (ref 4.22–5.81)
RDW: 14.1 % (ref 11.5–15.5)
RDW: 13.9 % (ref 11.5–15.5)
WBC: 5.7 10*3/uL (ref 4.0–10.5)
WBC: 8.5 10*3/uL (ref 4.0–10.5)

## 2014-04-22 LAB — CREATININE, SERUM
CREATININE: 0.67 mg/dL (ref 0.50–1.35)
GFR calc Af Amer: >90 mL/min (ref >90)
GFR, EST NON AFRICAN AMERICAN: 86 mL/min — AB (ref >90)

## 2014-04-22 LAB — POCT I-STAT 4, (NA,K, GLUC, HGB,HCT)
GLUCOSE: 109 mg/dL — AB (ref 70–99)
GLUCOSE: 120 mg/dL — AB (ref 70–99)
Glucose, Bld: 95 mg/dL (ref 70–99)
Glucose, Bld: 113 mg/dL — ABNORMAL HIGH (ref 70–99)
HCT: 32 % — ABNORMAL LOW (ref 39.0–52.0)
HCT: 29 % — ABNORMAL LOW (ref 39.0–52.0)
HCT: 31 % — ABNORMAL LOW (ref 39.0–52.0)
HEMATOCRIT: 32 % — AB (ref 39.0–52.0)
Hemoglobin: 10.9 g/dL — ABNORMAL LOW (ref 13.0–17.0)
Hemoglobin: 10.9 g/dL — ABNORMAL LOW (ref 13.0–17.0)
Hemoglobin: 9.9 g/dL — ABNORMAL LOW (ref 13.0–17.0)
Hemoglobin: 10.5 g/dL — ABNORMAL LOW (ref 13.0–17.0)
POTASSIUM: 4 meq/L (ref 3.7–5.3)
POTASSIUM: 4.2 meq/L (ref 3.7–5.3)
Potassium: 4 mEq/L (ref 3.7–5.3)
Potassium: 4 mEq/L (ref 3.7–5.3)
SODIUM: 145 meq/L (ref 137–147)
SODIUM: 143 meq/L (ref 137–147)
Sodium: 143 mEq/L (ref 137–147)
Sodium: 143 mEq/L (ref 137–147)

## 2014-04-22 LAB — PREPARE RBC (CROSSMATCH)

## 2014-04-22 LAB — GLUCOSE, CAPILLARY
Glucose-Capillary: 107 mg/dL — ABNORMAL HIGH (ref 70–99)
Glucose-Capillary: 169 mg/dL — ABNORMAL HIGH (ref 70–99)

## 2014-04-22 LAB — MAGNESIUM: Magnesium: 2.4 mg/dL (ref 1.5–2.5)

## 2014-04-22 SURGERY — IMPLANTATION, AORTIC VALVE, TRANSCATHETER, FEMORAL APPROACH
Anesthesia: General | Site: Chest

## 2014-04-22 MED ORDER — CLOPIDOGREL BISULFATE 75 MG PO TABS
75.0000 mg | ORAL_TABLET | Freq: Every day | ORAL | Status: DC
  Administered 2014-04-23 – 2014-04-24 (×2): 75 mg via ORAL
  Filled 2014-04-22 (×3): qty 1

## 2014-04-22 MED ORDER — TAMSULOSIN HCL 0.4 MG PO CAPS
0.4000 mg | ORAL_CAPSULE | Freq: Every evening | ORAL | Status: DC
  Administered 2014-04-23: 0.4 mg via ORAL
  Filled 2014-04-22 (×4): qty 1

## 2014-04-22 MED ORDER — ONDANSETRON HCL 4 MG/2ML IJ SOLN
INTRAMUSCULAR | Status: DC | PRN
  Administered 2014-04-22: 4 mg via INTRAVENOUS

## 2014-04-22 MED ORDER — NOREPINEPHRINE BITARTRATE 1 MG/ML IV SOLN
0.0000 ug/min | INTRAVENOUS | Status: AC
  Administered 2014-04-22: 2 ug/min via INTRAVENOUS
  Filled 2014-04-22: qty 8

## 2014-04-22 MED ORDER — NEOSTIGMINE METHYLSULFATE 10 MG/10ML IV SOLN
INTRAVENOUS | Status: DC | PRN
  Administered 2014-04-22: 3 mg via INTRAVENOUS

## 2014-04-22 MED ORDER — PHENYLEPHRINE HCL 10 MG/ML IJ SOLN
30.0000 ug/min | INTRAVENOUS | Status: DC
  Filled 2014-04-22: qty 2

## 2014-04-22 MED ORDER — ADULT MULTIVITAMIN W/MINERALS CH
1.0000 | ORAL_TABLET | Freq: Every day | ORAL | Status: DC
  Administered 2014-04-23 – 2014-04-24 (×2): 1 via ORAL
  Filled 2014-04-22 (×3): qty 1

## 2014-04-22 MED ORDER — LACTATED RINGERS IV SOLN
INTRAVENOUS | Status: DC | PRN
  Administered 2014-04-22: 06:00:00 via INTRAVENOUS

## 2014-04-22 MED ORDER — DEXTROSE 5 % IV SOLN
1.5000 g | Freq: Two times a day (BID) | INTRAVENOUS | Status: AC
  Administered 2014-04-22 – 2014-04-24 (×4): 1.5 g via INTRAVENOUS
  Filled 2014-04-22 (×5): qty 1.5

## 2014-04-22 MED ORDER — IODIXANOL 320 MG/ML IV SOLN
INTRAVENOUS | Status: DC | PRN
  Administered 2014-04-22: 102 mL via INTRAVENOUS

## 2014-04-22 MED ORDER — METOPROLOL SUCCINATE ER 100 MG PO TB24
100.0000 mg | ORAL_TABLET | Freq: Every day | ORAL | Status: DC
  Filled 2014-04-22 (×2): qty 1

## 2014-04-22 MED ORDER — ACETAMINOPHEN 650 MG RE SUPP: 650.0000 mg | Freq: Once | RECTAL | Status: DC

## 2014-04-22 MED ORDER — ALBUMIN HUMAN 5 % IV SOLN: 250.0000 mL | INTRAVENOUS | Status: DC | PRN

## 2014-04-22 MED ORDER — MAGNESIUM SULFATE 4000MG/100ML IJ SOLN
4.0000 g | Freq: Once | INTRAMUSCULAR | Status: AC
  Administered 2014-04-22: 4 g via INTRAVENOUS
  Filled 2014-04-22: qty 100

## 2014-04-22 MED ORDER — MORPHINE SULFATE 2 MG/ML IJ SOLN: 2.0000 mg | INTRAMUSCULAR | Status: DC | PRN

## 2014-04-22 MED ORDER — EPINEPHRINE HCL 1 MG/ML IJ SOLN
0.5000 ug/min | INTRAVENOUS | Status: DC
  Filled 2014-04-22: qty 4

## 2014-04-22 MED ORDER — NITROGLYCERIN IN D5W 200-5 MCG/ML-% IV SOLN
INTRAVENOUS | Status: DC | PRN
  Administered 2014-04-22: 10 ug/min via INTRAVENOUS

## 2014-04-22 MED ORDER — METOPROLOL TARTRATE 25 MG/10 ML ORAL SUSPENSION
12.5000 mg | Freq: Two times a day (BID) | ORAL | Status: DC
  Filled 2014-04-22 (×3): qty 5

## 2014-04-22 MED ORDER — SODIUM CHLORIDE 0.9 % IV SOLN
200.0000 ug | INTRAVENOUS | Status: DC | PRN
  Administered 2014-04-22: 0.2 ug/kg/h via INTRAVENOUS

## 2014-04-22 MED ORDER — GLYCOPYRROLATE 0.2 MG/ML IJ SOLN
INTRAMUSCULAR | Status: DC | PRN
  Administered 2014-04-22: 0.4 mg via INTRAVENOUS

## 2014-04-22 MED ORDER — SODIUM CHLORIDE 0.9 % IV SOLN: INTRAVENOUS | Status: AC

## 2014-04-22 MED ORDER — PHENYLEPHRINE HCL 10 MG/ML IJ SOLN
0.0000 ug/min | INTRAVENOUS | Status: DC
  Filled 2014-04-22: qty 2

## 2014-04-22 MED ORDER — MIDAZOLAM HCL 2 MG/2ML IJ SOLN
INTRAMUSCULAR | Status: AC
  Filled 2014-04-22: qty 2

## 2014-04-22 MED ORDER — ACETAMINOPHEN 500 MG PO TABS
1000.0000 mg | ORAL_TABLET | Freq: Four times a day (QID) | ORAL | Status: DC
  Administered 2014-04-22 – 2014-04-24 (×5): 1000 mg via ORAL
  Filled 2014-04-22 (×10): qty 2

## 2014-04-22 MED ORDER — SODIUM CHLORIDE 0.9 % IV SOLN
INTRAVENOUS | Status: DC
  Filled 2014-04-22: qty 1

## 2014-04-22 MED ORDER — ROCURONIUM BROMIDE 50 MG/5ML IV SOLN
INTRAVENOUS | Status: AC
  Filled 2014-04-22: qty 1

## 2014-04-22 MED ORDER — METHIMAZOLE 5 MG PO TABS
5.0000 mg | ORAL_TABLET | ORAL | Status: DC
  Administered 2014-04-23: 5 mg via ORAL
  Filled 2014-04-22: qty 1

## 2014-04-22 MED ORDER — PROPOFOL 10 MG/ML IV BOLUS
INTRAVENOUS | Status: AC
  Filled 2014-04-22: qty 20

## 2014-04-22 MED ORDER — CHLORHEXIDINE GLUCONATE 4 % EX LIQD: 30.0000 mL | CUTANEOUS | Status: DC

## 2014-04-22 MED ORDER — SODIUM CHLORIDE 0.9 % IV SOLN
1250.0000 mg | INTRAVENOUS | Status: AC
  Administered 2014-04-22: 1250 mg via INTRAVENOUS
  Filled 2014-04-22: qty 1250

## 2014-04-22 MED ORDER — POTASSIUM CHLORIDE 10 MEQ/50ML IV SOLN: 10.0000 meq | INTRAVENOUS | Status: AC

## 2014-04-22 MED ORDER — MIDAZOLAM HCL 2 MG/2ML IJ SOLN: 2.0000 mg | INTRAMUSCULAR | Status: DC | PRN

## 2014-04-22 MED ORDER — FENTANYL CITRATE 0.05 MG/ML IJ SOLN
INTRAMUSCULAR | Status: AC
  Filled 2014-04-22: qty 5

## 2014-04-22 MED ORDER — NEOSTIGMINE METHYLSULFATE 10 MG/10ML IV SOLN
INTRAVENOUS | Status: AC
  Filled 2014-04-22: qty 1

## 2014-04-22 MED ORDER — SODIUM CHLORIDE 0.9 % IR SOLN
Status: DC | PRN
  Administered 2014-04-22 (×3)

## 2014-04-22 MED ORDER — SODIUM CHLORIDE 0.45 % IV SOLN: INTRAVENOUS | Status: DC

## 2014-04-22 MED ORDER — SUCCINYLCHOLINE CHLORIDE 20 MG/ML IJ SOLN
INTRAMUSCULAR | Status: AC
  Filled 2014-04-22: qty 1

## 2014-04-22 MED ORDER — INSULIN REGULAR BOLUS VIA INFUSION
0.0000 [IU] | Freq: Three times a day (TID) | INTRAVENOUS | Status: DC
  Filled 2014-04-22: qty 10

## 2014-04-22 MED ORDER — AMIODARONE HCL 200 MG PO TABS
200.0000 mg | ORAL_TABLET | Freq: Every day | ORAL | Status: DC
  Administered 2014-04-23 – 2014-04-24 (×2): 200 mg via ORAL
  Filled 2014-04-22 (×3): qty 1

## 2014-04-22 MED ORDER — VANCOMYCIN HCL IN DEXTROSE 1-5 GM/200ML-% IV SOLN
1000.0000 mg | Freq: Once | INTRAVENOUS | Status: AC
  Administered 2014-04-22: 1000 mg via INTRAVENOUS
  Filled 2014-04-22: qty 200

## 2014-04-22 MED ORDER — INSULIN ASPART 100 UNIT/ML ~~LOC~~ SOLN
0.0000 [IU] | SUBCUTANEOUS | Status: DC
  Administered 2014-04-22: 4 [IU] via SUBCUTANEOUS

## 2014-04-22 MED ORDER — MORPHINE SULFATE 2 MG/ML IJ SOLN
1.0000 mg | INTRAMUSCULAR | Status: AC | PRN
  Administered 2014-04-22: 1 mg via INTRAVENOUS
  Filled 2014-04-22: qty 1

## 2014-04-22 MED ORDER — ASPIRIN EC 81 MG PO TBEC
81.0000 mg | DELAYED_RELEASE_TABLET | Freq: Every day | ORAL | Status: DC
  Administered 2014-04-23 – 2014-04-24 (×2): 81 mg via ORAL
  Filled 2014-04-22 (×3): qty 1

## 2014-04-22 MED ORDER — ONDANSETRON HCL 4 MG/2ML IJ SOLN
INTRAMUSCULAR | Status: AC
  Filled 2014-04-22: qty 2

## 2014-04-22 MED ORDER — ETOMIDATE 2 MG/ML IV SOLN
INTRAVENOUS | Status: DC | PRN
  Administered 2014-04-22: 16 mg via INTRAVENOUS

## 2014-04-22 MED ORDER — MAGNESIUM SULFATE 50 % IJ SOLN
40.0000 meq | INTRAMUSCULAR | Status: DC
  Filled 2014-04-22: qty 10

## 2014-04-22 MED ORDER — ATORVASTATIN CALCIUM 20 MG PO TABS
20.0000 mg | ORAL_TABLET | Freq: Every day | ORAL | Status: DC
  Administered 2014-04-22 – 2014-04-23 (×2): 20 mg via ORAL
  Filled 2014-04-22 (×4): qty 1

## 2014-04-22 MED ORDER — ACETAMINOPHEN 160 MG/5ML PO SOLN
1000.0000 mg | Freq: Four times a day (QID) | ORAL | Status: DC
  Filled 2014-04-22: qty 40

## 2014-04-22 MED ORDER — ARTIFICIAL TEARS OP OINT
TOPICAL_OINTMENT | OPHTHALMIC | Status: AC
  Filled 2014-04-22: qty 3.5

## 2014-04-22 MED ORDER — SODIUM CHLORIDE 0.9 % IV SOLN: INTRAVENOUS | Status: DC

## 2014-04-22 MED ORDER — OXYCODONE HCL 5 MG PO TABS: 5.0000 mg | ORAL_TABLET | ORAL | Status: DC | PRN

## 2014-04-22 MED ORDER — DEXTROSE 5 % IV SOLN
1.5000 g | INTRAVENOUS | Status: AC
  Administered 2014-04-22: 1.5 g via INTRAVENOUS
  Filled 2014-04-22 (×2): qty 1.5

## 2014-04-22 MED ORDER — ALBUMIN HUMAN 5 % IV SOLN
INTRAVENOUS | Status: DC | PRN
  Administered 2014-04-22: 08:00:00 via INTRAVENOUS

## 2014-04-22 MED ORDER — MIDAZOLAM HCL 5 MG/5ML IJ SOLN
INTRAMUSCULAR | Status: DC | PRN
  Administered 2014-04-22: 0.5 mg via INTRAVENOUS

## 2014-04-22 MED ORDER — NITROGLYCERIN IN D5W 200-5 MCG/ML-% IV SOLN
2.0000 ug/min | INTRAVENOUS | Status: DC
  Filled 2014-04-22: qty 250

## 2014-04-22 MED ORDER — LACTATED RINGERS IV SOLN: 500.0000 mL | Freq: Once | INTRAVENOUS | Status: AC | PRN

## 2014-04-22 MED ORDER — ROCURONIUM BROMIDE 100 MG/10ML IV SOLN
INTRAVENOUS | Status: DC | PRN
  Administered 2014-04-22: 10 mg via INTRAVENOUS
  Administered 2014-04-22: 40 mg via INTRAVENOUS

## 2014-04-22 MED ORDER — FAMOTIDINE IN NACL 20-0.9 MG/50ML-% IV SOLN: 20.0000 mg | Freq: Two times a day (BID) | INTRAVENOUS | Status: AC

## 2014-04-22 MED ORDER — NITROGLYCERIN IN D5W 200-5 MCG/ML-% IV SOLN: 0.0000 ug/min | INTRAVENOUS | Status: DC

## 2014-04-22 MED ORDER — DEXMEDETOMIDINE HCL IN NACL 200 MCG/50ML IV SOLN: 0.1000 ug/kg/h | INTRAVENOUS | Status: DC

## 2014-04-22 MED ORDER — PANTOPRAZOLE SODIUM 40 MG PO TBEC
40.0000 mg | DELAYED_RELEASE_TABLET | Freq: Two times a day (BID) | ORAL | Status: DC
  Administered 2014-04-22 – 2014-04-24 (×4): 40 mg via ORAL
  Filled 2014-04-22 (×3): qty 1

## 2014-04-22 MED ORDER — EPHEDRINE SULFATE 50 MG/ML IJ SOLN
INTRAMUSCULAR | Status: AC
  Filled 2014-04-22: qty 1

## 2014-04-22 MED ORDER — SODIUM CHLORIDE 0.9 % IV SOLN
INTRAVENOUS | Status: DC | PRN
  Administered 2014-04-22: 10:00:00 via INTRAVENOUS

## 2014-04-22 MED ORDER — FENTANYL CITRATE 0.05 MG/ML IJ SOLN
INTRAMUSCULAR | Status: DC | PRN
  Administered 2014-04-22: 50 ug via INTRAVENOUS
  Administered 2014-04-22: 200 ug via INTRAVENOUS

## 2014-04-22 MED ORDER — DEXMEDETOMIDINE HCL IN NACL 400 MCG/100ML IV SOLN
0.1000 ug/kg/h | INTRAVENOUS | Status: DC
  Filled 2014-04-22: qty 100

## 2014-04-22 MED ORDER — METOPROLOL TARTRATE 1 MG/ML IV SOLN: 2.5000 mg | INTRAVENOUS | Status: DC | PRN

## 2014-04-22 MED ORDER — PHENOL 1.4 % MT LIQD
1.0000 | OROMUCOSAL | Status: DC | PRN
  Filled 2014-04-22: qty 177

## 2014-04-22 MED ORDER — METOPROLOL TARTRATE 12.5 MG HALF TABLET
12.5000 mg | ORAL_TABLET | Freq: Two times a day (BID) | ORAL | Status: DC
  Filled 2014-04-22 (×3): qty 1

## 2014-04-22 MED ORDER — DOPAMINE-DEXTROSE 3.2-5 MG/ML-% IV SOLN
2.0000 ug/kg/min | INTRAVENOUS | Status: DC
  Filled 2014-04-22: qty 250

## 2014-04-22 MED ORDER — SIMVASTATIN 40 MG PO TABS
40.0000 mg | ORAL_TABLET | Freq: Every day | ORAL | Status: DC
  Filled 2014-04-22: qty 1

## 2014-04-22 MED ORDER — ACETAMINOPHEN 160 MG/5ML PO SOLN
650.0000 mg | Freq: Once | ORAL | Status: DC
  Filled 2014-04-22: qty 20.3

## 2014-04-22 MED ORDER — GLYCOPYRROLATE 0.2 MG/ML IJ SOLN
INTRAMUSCULAR | Status: AC
  Filled 2014-04-22: qty 2

## 2014-04-22 MED ORDER — PHENYLEPHRINE 40 MCG/ML (10ML) SYRINGE FOR IV PUSH (FOR BLOOD PRESSURE SUPPORT)
PREFILLED_SYRINGE | INTRAVENOUS | Status: AC
  Filled 2014-04-22: qty 10

## 2014-04-22 MED ORDER — SODIUM CHLORIDE 0.9 % IV SOLN
INTRAVENOUS | Status: DC
  Filled 2014-04-22: qty 30

## 2014-04-22 MED ORDER — POTASSIUM CHLORIDE 2 MEQ/ML IV SOLN
80.0000 meq | INTRAVENOUS | Status: DC
  Filled 2014-04-22: qty 40

## 2014-04-22 MED ORDER — FERROUS SULFATE 325 (65 FE) MG PO TABS
325.0000 mg | ORAL_TABLET | Freq: Every day | ORAL | Status: DC
  Administered 2014-04-23 – 2014-04-24 (×2): 325 mg via ORAL
  Filled 2014-04-22 (×3): qty 1

## 2014-04-22 MED ORDER — PROMETHAZINE HCL 25 MG/ML IJ SOLN: 12.5000 mg | Freq: Three times a day (TID) | INTRAMUSCULAR | Status: DC | PRN

## 2014-04-22 MED ORDER — CHLORHEXIDINE GLUCONATE CLOTH 2 % EX PADS: 6.0000 | MEDICATED_PAD | Freq: Once | CUTANEOUS | Status: DC

## 2014-04-22 MED ORDER — PROTAMINE SULFATE 10 MG/ML IV SOLN
INTRAVENOUS | Status: DC | PRN
  Administered 2014-04-22: 90 mg via INTRAVENOUS

## 2014-04-22 MED ORDER — ONDANSETRON HCL 4 MG/2ML IJ SOLN
4.0000 mg | Freq: Four times a day (QID) | INTRAMUSCULAR | Status: DC | PRN
  Administered 2014-04-22: 4 mg via INTRAVENOUS
  Filled 2014-04-22: qty 2

## 2014-04-22 SURGICAL SUPPLY — 115 items
ADAPTER CARDIOPLEGIA (MISCELLANEOUS) IMPLANT
ADH SKN CLS APL DERMABOND .7 (GAUZE/BANDAGES/DRESSINGS) ×2
ANTEGRADE CPLG (MISCELLANEOUS) IMPLANT
ATTRACTOMAT 16X20 MAGNETIC DRP (DRAPES) IMPLANT
BAG BANDED W/RUBBER/TAPE 36X54 (MISCELLANEOUS) ×6 IMPLANT
BAG DECANTER FOR FLEXI CONT (MISCELLANEOUS) IMPLANT
BAG EQP BAND 135X91 W/RBR TAPE (MISCELLANEOUS) ×4
BAG SNAP BAND KOVER 36X36 (MISCELLANEOUS) ×4 IMPLANT
BLADE 10 SAFETY STRL DISP (BLADE) ×4 IMPLANT
BLADE STERNUM SYSTEM 6 (BLADE) ×4 IMPLANT
BLADE SURG ROTATE 9660 (MISCELLANEOUS) IMPLANT
CABLE PACING FASLOC BIEGE (MISCELLANEOUS) ×6 IMPLANT
CABLE PACING FASLOC BLUE (MISCELLANEOUS) ×4 IMPLANT
CANISTER SUCTION 2500CC (MISCELLANEOUS) IMPLANT
CANNULA FEM VENOUS REMOTE 22FR (CANNULA) IMPLANT
CANNULA FEMORAL ART 14 SM (MISCELLANEOUS) IMPLANT
CANNULA GUNDRY RCSP 15FR (MISCELLANEOUS) IMPLANT
CANNULA OPTISITE PERFUSION 16F (CANNULA) IMPLANT
CANNULA OPTISITE PERFUSION 18F (CANNULA) IMPLANT
CANNULA SOFTFLOW AORTIC 7M21FR (CANNULA) IMPLANT
CANNULA VENOUS LOW PROF 34X46 (CANNULA) IMPLANT
CATH DIAG EXPO 6F VENT PIG 145 (CATHETERS) ×4 IMPLANT
CATH EXPO 5FR AL1 (CATHETERS) ×2 IMPLANT
CATH HEART VENT LEFT (CATHETERS) IMPLANT
CATH S G BIP PACING (SET/KITS/TRAYS/PACK) ×6 IMPLANT
CLIP TI MEDIUM 24 (CLIP) ×4 IMPLANT
CLIP TI WIDE RED SMALL 24 (CLIP) ×4 IMPLANT
CONN ST 1/4X3/8  BEN (MISCELLANEOUS)
CONN ST 1/4X3/8 BEN (MISCELLANEOUS) IMPLANT
CONNECTOR 1/2X3/8X1/2 3 WAY (MISCELLANEOUS)
CONNECTOR 1/2X3/8X1/2 3WAY (MISCELLANEOUS) IMPLANT
COVER DOME SNAP 22 D (MISCELLANEOUS) ×4 IMPLANT
COVER MAYO STAND STRL (DRAPES) ×4 IMPLANT
COVER PROBE W GEL 5X96 (DRAPES) IMPLANT
COVER SURGICAL LIGHT HANDLE (MISCELLANEOUS) ×4 IMPLANT
COVER TABLE BACK 60X90 (DRAPES) ×4 IMPLANT
CRADLE DONUT ADULT HEAD (MISCELLANEOUS) ×4 IMPLANT
DERMABOND ADVANCED (GAUZE/BANDAGES/DRESSINGS) ×2
DERMABOND ADVANCED .7 DNX12 (GAUZE/BANDAGES/DRESSINGS) ×2 IMPLANT
DRAIN CHANNEL 28F RND 3/8 FF (WOUND CARE) IMPLANT
DRAIN CHANNEL 32F RND 10.7 FF (WOUND CARE) IMPLANT
DRAPE INCISE IOBAN 66X45 STRL (DRAPES) IMPLANT
DRAPE SLUSH/WARMER DISC (DRAPES) ×4 IMPLANT
DRAPE TABLE COVER HEAVY DUTY (DRAPES) ×4 IMPLANT
DRSG TEGADERM 4X4.75 (GAUZE/BANDAGES/DRESSINGS) ×4 IMPLANT
ELECT REM PT RETURN 9FT ADLT (ELECTROSURGICAL) ×8
ELECTRODE REM PT RTRN 9FT ADLT (ELECTROSURGICAL) ×4 IMPLANT
FELT TEFLON 6X6 (MISCELLANEOUS) ×4 IMPLANT
FEMORAL VENOUS CANN RAP (CANNULA) IMPLANT
GLOVE ECLIPSE 7.5 STRL STRAW (GLOVE) ×4 IMPLANT
GLOVE ECLIPSE 8.0 STRL XLNG CF (GLOVE) ×8 IMPLANT
GLOVE EUDERMIC 7 POWDERFREE (GLOVE) ×4 IMPLANT
GLOVE ORTHO TXT STRL SZ7.5 (GLOVE) ×4 IMPLANT
GOWN STRL REUS W/ TWL LRG LVL3 (GOWN DISPOSABLE) ×6 IMPLANT
GOWN STRL REUS W/ TWL XL LVL3 (GOWN DISPOSABLE) ×12 IMPLANT
GOWN STRL REUS W/TWL LRG LVL3 (GOWN DISPOSABLE) ×12
GOWN STRL REUS W/TWL XL LVL3 (GOWN DISPOSABLE) ×24
GUIDEWIRE SAF TJ AMPL .035X180 (WIRE) ×6 IMPLANT
GUIDEWIRE SAFE TJ AMPLATZ EXST (WIRE) ×2 IMPLANT
HEMOSTAT POWDER SURGIFOAM 1G (HEMOSTASIS) IMPLANT
INSERT FOGARTY 61MM (MISCELLANEOUS) IMPLANT
INSERT FOGARTY XLG (MISCELLANEOUS) IMPLANT
KIT BASIN OR (CUSTOM PROCEDURE TRAY) ×4 IMPLANT
KIT DILATOR VASC 18G NDL (KITS) IMPLANT
KIT HEART LEFT (KITS) ×2 IMPLANT
KIT ROOM TURNOVER OR (KITS) ×4 IMPLANT
KIT SUCTION CATH 14FR (SUCTIONS) ×8 IMPLANT
LEAD PACING MYOCARDI (MISCELLANEOUS) IMPLANT
NDL PERC 18GX7CM (NEEDLE) ×2 IMPLANT
NEEDLE PERC 18GX7CM (NEEDLE) ×4 IMPLANT
NS IRRIG 1000ML POUR BTL (IV SOLUTION) ×12 IMPLANT
PACK AORTA (CUSTOM PROCEDURE TRAY) ×4 IMPLANT
PAD ARMBOARD 7.5X6 YLW CONV (MISCELLANEOUS) ×8 IMPLANT
PAD ELECT DEFIB RADIOL ZOLL (MISCELLANEOUS) ×4 IMPLANT
PATCH TACHOSII LRG 9.5X4.8 (VASCULAR PRODUCTS) IMPLANT
SET CANNULATION TOURNIQUET (MISCELLANEOUS) IMPLANT
SHEATH PINNACLE 6F 10CM (SHEATH) ×4 IMPLANT
SPONGE GAUZE 4X4 12PLY (GAUZE/BANDAGES/DRESSINGS) ×4 IMPLANT
SPONGE GAUZE 4X4 12PLY STER LF (GAUZE/BANDAGES/DRESSINGS) ×2 IMPLANT
SPONGE LAP 4X18 X RAY DECT (DISPOSABLE) ×4 IMPLANT
STOPCOCK MORSE 400PSI 3WAY (MISCELLANEOUS) ×4 IMPLANT
SUT ETHIBOND 2 0 SH (SUTURE) ×4
SUT ETHIBOND 2 0 SH 36X2 (SUTURE) ×2 IMPLANT
SUT ETHIBOND X763 2 0 SH 1 (SUTURE) ×8 IMPLANT
SUT MNCRL AB 3-0 PS2 18 (SUTURE) ×4 IMPLANT
SUT PDS AB 1 CTX 36 (SUTURE) IMPLANT
SUT PROLENE 2 0 MH 48 (SUTURE) IMPLANT
SUT PROLENE 3 0 SH1 36 (SUTURE) IMPLANT
SUT PROLENE 4 0 RB 1 (SUTURE)
SUT PROLENE 4-0 RB1 .5 CRCL 36 (SUTURE) IMPLANT
SUT PROLENE 5 0 C 1 36 (SUTURE) ×12 IMPLANT
SUT PROLENE 6 0 C 1 30 (SUTURE) ×12 IMPLANT
SUT SILK  1 MH (SUTURE) ×2
SUT SILK 1 MH (SUTURE) ×2 IMPLANT
SUT SILK 2 0 SH CR/8 (SUTURE) ×4 IMPLANT
SUT TEM PAC WIRE 2 0 SH (SUTURE) IMPLANT
SUT VIC AB 2-0 CTX 36 (SUTURE) IMPLANT
SUT VIC AB 3-0 SH 8-18 (SUTURE) ×8 IMPLANT
SYR 30ML LL (SYRINGE) ×8 IMPLANT
SYR 50ML LL SCALE MARK (SYRINGE) ×4 IMPLANT
SYSTEM SAHARA CHEST DRAIN ATS (WOUND CARE) ×4 IMPLANT
TAPE CLOTH SURG 4X10 WHT LF (GAUZE/BANDAGES/DRESSINGS) ×2 IMPLANT
TOWEL OR 17X24 6PK STRL BLUE (TOWEL DISPOSABLE) ×12 IMPLANT
TOWEL OR 17X26 10 PK STRL BLUE (TOWEL DISPOSABLE) ×8 IMPLANT
TRANSDUCER W/STOPCOCK (MISCELLANEOUS) ×4 IMPLANT
TRAY FOLEY IC TEMP SENS 14FR (CATHETERS) ×4 IMPLANT
TUBE SUCT INTRACARD DLP 20F (MISCELLANEOUS) IMPLANT
TUBING ART PRESS 72  MALE/FEM (TUBING) ×2
TUBING ART PRESS 72 MALE/FEM (TUBING) IMPLANT
TUBING HIGH PRESSURE 120CM (CONNECTOR) ×4 IMPLANT
UNDERPAD 30X30 INCONTINENT (UNDERPADS AND DIAPERS) ×4 IMPLANT
VALVE HRT TRANSCATH ASCEND 29M (Valve) ×2 IMPLANT
VENT LEFT HEART 12002 (CATHETERS)
WATER STERILE IRR 1000ML POUR (IV SOLUTION) ×8 IMPLANT
WIRE .035 3MM-J 145CM (WIRE) ×2 IMPLANT

## 2014-04-22 NOTE — Op Note (Signed)
CARDIOTHORACIC SURGERY OPERATIVE NOTE  Date of Procedure:  04/22/2014  Preoperative Diagnosis: Severe Aortic Stenosis   Postoperative Diagnosis: Same   Procedure:    Transcatheter Aortic Valve Replacement - Open Right Transfemoral Approach  Edwards Sapien XT (size 29 mm, model # 9300TFX, serial # [id number])   Co-Surgeons:  Valentina Gu. Roxy Manns, MD and [name], MD  Assistants:   Gaye Pollack, MD   Anesthesiologist:  Midge Minium, MD  Echocardiographer:  Ena Dawley, MD  Pre-operative Echo Findings:  Severe aortic stenosis  Mild left ventricular systolic dysfunction  Post-operative Echo Findings:  Trivial paravalvular leak  Unchanged left ventricular systolic function     DETAILS OF THE OPERATIVE PROCEDURE  The majority of the procedure is documented separately in a procedure note by Dr. Burt Knack.   TRANSFEMORAL ACCESS:   A small incision is made in the right groin immediately over the common femoral artery. The subcutaneous tissues are divided with electrocautery and the anterior surface of the common femoral artery is identified. The common femoral artery was severely diseased with atherosclerotic plaque.  The inguinal ligament was divided to facilitate exposure of the external iliac artery.  Sharp dissection is utilized to free up the artery proximally and distally and the vessel is encircled with a vessel loop.  A pair of CV-4 Gore-tex sutures are place as diamond-shaped purse-strings on the anterior surface of the femoral artery.  The patient is heparinized systemically and ACT verified > 250 seconds.  The common femoral artery is punctured using an 18 gauge needle and a soft J-tipped guidewire is passed into the common iliac artery under fluoroscopic guidance.  A 6 Fr straight diagnostic catheter is placed over the guidewire and the guidewire is removed.  An Amplatz super stiff guidewire is passed through the sheath into the descending thoracic aorta and  the introducing diagnostic catheter is removed.  Serial dilators are passed over the guidewire under continuous fluoroscopic guidance, making certain that each dilator passes easily all of the way into the distal abdominal aorta.  A 20 Fr Edwards Novoflex Plus introducer sheath is passed over the guidewire into the abdominal aorta.  The introducing dilator is removed, the sheath is flushed with heparinized saline, and the sheath is secured to the skin.    FEMORAL SHEATH REMOVAL AND ARTERIAL CLOSURE:  After the completion of successful valve deployment as documented separately by Dr. Burt Knack, the femoral artery sheath is removed and the arteriotomy is closed using the previously placed Gore-tex purse-string sutures. Once the repair has been completed protamine was administered to reverse the anticoagulation. A digitally-subtracted arteriogram is obtained from just above the aortic bifurcation to below the arteriotomy to confirm the integrity of the vascular repair.  The incision is irrigated with saline solution and subsequently closed in multiple layers using absorbable suture.  The skin incision is closed using a subcuticular skin closure.     Valentina Gu. Roxy Manns MD 04/22/2014 10:43 AM

## 2014-04-22 NOTE — Progress Notes (Signed)
[facility] BRIEF SICU PROGRESS NOTE  Day of Surgery  S/P Procedure(s) (LRB): TRANSCATHETER AORTIC VALVE REPLACEMENT, TRANSFEMORAL (N/A) INTRAOPERATIVE TRANSTHORACIC ECHOCARDIOGRAM (N/A)   Looks good.  Awake and alert, breathing comfortably.  Some nausea. AV paced w/ stable hemodynamics UOP adequate Labs okay  Plan: Continue routine early postop  OWEN,CLARENCE H 04/22/2014 2:41 PM

## 2014-04-22 NOTE — H&P (View-Only) (Signed)
MULTIDISCIPLINARY HEART VALVE CLINIC NOTE  Patient ID: [name] [name] MRN: [id number] DOB/AGE: 1929-06-11 80 y.o.  Primary Care Physician:ELLISON, SEAN, MD Primary Cardiologist: Dr [name]  HPI: [name] [name] returns for follow-up as he continues to undergo evaluation for TAVR.   He has a cardiac history dating back to 2009 when he was diagnosed with MVP and associated severe [name] as well as atrial fibrillation. He was noted to have 70% calcified stenosis at the LAD ostium and moderate disease in his other coronary arteries. He underwent robotic mitral valve repair at [facility] with grafting of the LAD using a SVG conduit because the LIMA was inadequate. He underwent Cryoablation and ultimately required mitral valve replacement with a 29 mm St Jude Biocor prosthesis. His post-op course was complicated by an embolic stroke from which he has had a full recovery. He has undergone placement of a permanent pacemaker and is pacer-dependent. He's been maintained on amiodarone for PAF with a very low AF burden and he's been anticoagulated with warfarin until recently. LV function is mildly depressed at 45-50%. Warfarin was recently stopped because he has undergone rotational atherectomy and stenting of the proximal LAD after cardiac cath demonstrated severe proximal and ostial LAD stenosis with an occluded graft to this vessel. He was treated with a DES and is now maintained on DAPT with ASA and clopidogrel.   He's had slight improvement in shortness of breath since his PCI procedure, but continues to be quite limited. Denies chest pain, orthopnea, or PND. No lightheadedness or syncope. Tolerating ASA and plavix without bleeding problems.    Past Medical History  Diagnosis Date  . GASTRIC POLYP 05/13/2009  . COLONIC POLYPS, ADENOMATOUS 01/14/2008  . HYPERLIPIDEMIA 06/01/2007  . ANEMIA, IRON DEFICIENCY 10/13/2008  . COAGULOPATHY, COUMADIN-INDUCED 01/14/2008  . ANXIETY 09/04/2008  . HYPERTENSION 06/01/2007  . ATRIAL  FIBRILLATION, CHRONIC 01/14/2008    on warfarin since 2007  . UNSPECIFIED VENOUS INSUFFICIENCY 09/18/2008  . PLEURAL EFFUSION, LEFT 10/13/2008  . GERD 06/01/2007  . BARRETTS ESOPHAGUS 03/20/2009  . PROSTATE CANCER, HX OF 01/14/2008  . CATARACTS, BILATERAL, HX OF 01/14/2008  . CEREBROVASCULAR ACCIDENT, HX OF 08/18/2008  . PERSONAL HX COLONIC POLYPS 01/01/2010  . BASAL CELL CARCINOMA OF SKIN SITE UNSPECIFIED 09/28/2010  . HYPERTHYROIDISM 01/06/2011  . Hyperglycemia   . Elevated PSA   . Restless leg syndrome   . Mitral regurgitation 06/13/2008    mitral valve replacement #20 St. Jude Biocor; Maze procedure by Dr. Amador Cunas  at [facility]  . CORONARY ARTERY DISEASE 06/01/2007    Cath 04/03/2008; Cath 11/01/1990  . Tachy-brady syndrome 07/13/2008    medtronic Adapta gen. model ADDR01 ,serial # [id number] H by Dr  [name] at [facility]  . Peripheral artery disease 06/16/2010    LEA doppler-left ABI nml 0.61 calcified vessels;right ABI  0.68  . Edema, peripheral 07/30/2009    LEV doppler- no thrombus or thrombophlebitis  . Aortic valvular disorder 08/16/2012    echo EF 45-50% LV mildly reduce,Biocor mitral valve,mild to mod. tricuspid regrug.,mild to mod. aortic regrug.  . Mitral valve disorder 09/08/2011    echo EF 50-55% LV normal  . Aortic valvular stenosis 11/19/2013  . Cardiomyopathy, ischemic 11/19/2013    EF 45-50% with inferior wall hypokinesis by echo 2014  . S/P CABG x 1 06/13/2008    SVG to LAD with open vein harvest right thigh, LIMA harvested but not utilized by Dr Amador Cunas  . S/P mitral valve replacement with bioprosthetic valve 06/13/2008  66m St Jude Biocor porcine bioprosthetic tissue valve by Dr CAmador Cunas . S/P Maze operation for atrial fibrillation 06/13/2008    Partial left atrial lesion set using cryothermy for bilateral pulmonary vein isolation by Dr CAmador Cunas . Prosthetic valve dysfunction     Mitral stenosis involving bioprosthetic tissue valve placed 06/13/2008  . Mitral stenosis      Porcine bioprosthetic tissue valve placed 06/13/2008  . Dysrhythmia     HX OF TACHY BRADY  . Pacemaker     Past Surgical History  Procedure Laterality Date  . Prostatectomy    . Tonsillectomy    . Cystectomy      Left leg  . Pacemaker placement  07/13/2008    [place] [facility]  . Coronary artery bypass graft  06/13/2008    graft x1 w/vein graft [facility]AD [facility] . Mitral valve replacement  06/13/2008    #20 St. Jude Bicor mitral valve ;[facility] . Cardiac catheterization  11/01/1990  . Skin cancer excision      removed from forehead and right leg, nose/face  . Upper gastrointestinal endoscopy  07/14/2009    erosive reflux esopagitis, Barrett's esophagus  . Colonoscopy  07/14/2009    diverticulosis, internal hemorrhoids  . Cardiovascular stress test  06/03/2010    Persantine perfusion EF 45% mild to moderate ischemia basal and mid inferolateral region  . Cardiovascular stress test  01/01/2008    left ventricle normal,no significant ischemia  . Cardiovascular stress test  09/09/2005     possibility of ischemia inferior wall  . Tee without cardioversion N/A 01/22/2014    Procedure: TRANSESOPHAGEAL ECHOCARDIOGRAM (TEE);  Surgeon: MSanda Klein MD;  [facility];  Service: Cardiovascular;  Laterality: N/A;  . Coronary stent placement  03/24/2014    DES to LAD    Family History  Problem Relation Age of Onset  . Cancer Mother     Breast Cancer    History   Social History  . Marital Status: Married    Spouse Name: N/A    Number of Children: 4  . Years of Education: N/A   Occupational History  . Retired    Social History Main Topics  . Smoking status: Former Smoker -- 2 years    Types: Pipe    Quit date: 11/07/1965  . Smokeless tobacco: Never Used     Comment: quit over 40 years ago, never smoked cigarettes  . Alcohol Use: No  . Drug Use: No  . Sexual Activity: Not on file   Other Topics Concern   . Not on file   Social History Narrative  . No narrative on file     Prior to Admission medications   Medication Sig Start Date End Date Taking? Authorizing Provider  amiodarone (PACERONE) 200 MG tablet Take 1 tablet (200 mg total) by mouth daily. 11/04/13  Yes [name] [name], MD  aspirin EC 81 MG tablet Take 81 mg by mouth daily.   Yes Historical Provider, MD  clopidogrel (PLAVIX) 75 MG tablet Take 1 tablet (75 mg total) by mouth daily. 03/14/14 03/14/15 Yes MSherren Mocha MD  ferrous sulfate 325 (65 FE) MG tablet Take 325 mg by mouth daily with breakfast.   Yes Historical Provider, MD  methimazole (TAPAZOLE) 5 MG tablet Take 5 mg by mouth 3 (three) times a week. On Monday, Wednesday and Friday 02/25/14  Yes SRenato Shin MD  metoprolol succinate (TOPROL-XL) 100 MG 24  hr tablet Take 100 mg by mouth daily. Take with or immediately following a meal. 02/05/14  Yes [name], MD  Multiple Vitamin (MULTIVITAMIN WITH MINERALS) TABS Take 1 tablet by mouth daily. Centrum Silver   Yes Historical Provider, MD  pantoprazole (PROTONIX) 40 MG tablet Take 1 tablet (40 mg total) by mouth 2 (two) times daily. 12/26/13  Yes Gatha Mayer, MD  simvastatin (ZOCOR) 40 MG tablet Take 1 tablet (40 mg total) by mouth at bedtime. 05/14/13  Yes [name], MD  Tamsulosin HCl (FLOMAX) 0.4 MG CAPS Take 0.4 mg by mouth every evening.    Yes Historical Provider, MD    No Known Allergies  ROS:  General: no fevers/chills/night sweats Eyes: no blurry vision, diplopia, or amaurosis ENT: no sore throat or hearing loss Resp: no cough, wheezing, or hemoptysis CV: no edema or palpitations GI: no abdominal pain, nausea, vomiting, diarrhea, or constipation GU: no dysuria, frequency, or hematuria Skin: no rash Neuro: no headache, numbness, tingling, or weakness of extremities Musculoskeletal: no joint pain or swelling Heme: no bleeding, DVT, or easy bruising Endo: no polydipsia or polyuria  Wt 175 lb (79.379 kg)  SpO2  %  PHYSICAL EXAM: Pt is alert and oriented, WD, WN, in no distress. HEENT: normal Neck: JVP normal. Carotid upstrokes delayed. No thyromegaly. Lungs: equal expansion, clear bilaterally CV: Apex is discrete and nondisplaced, RRR with grade 3/6 systolic murmur at the LLSB Abd: soft, NT, +BS Back: no CVA tenderness Ext: mild pretibial edema bilaterally Skin: warm and dry without rash Neuro: CNII-XII intact             Strength intact = bilaterally  2-D echocardiogram:  Left ventricle: The cavity size was normal. There was mild concentric hypertrophy. Systolic function was mildly to moderately reduced. The estimated ejection fraction was in the range of 40% to 45%. Images were inadequate for LV wall motion assessment.  ------------------------------------------------------------ Aortic valve: Severely thickened, severely calcified leaflets. Valve mobility was severely restricted. Doppler: Mild regurgitation. Valve area: 0.76cm^2(VTI). Indexed valve area: 0.38cm^2/m^2 (VTI). Peak velocity ratio of LVOT to aortic valve: 0.21. Valve area: 0.74cm^2 (Vmax). Indexed valve area: 0.37cm^2/m^2 (Vmax). Mean gradient: 17m Hg (S). Peak gradient: 463mHg (S).  ------------------------------------------------------------ Mitral valve: A 2927mocor porcine bioprosthesis was present and functioning normally. Gradients are similar to previous studies. Preserved subvalvular apparatus. Gradients are probably normal for this valve. Gradients recorded at a heart rate of 88 bpm. Doppler: No significant regurgitation. Valve area by pressure half-time: 2.68cm^2. Indexed valve area by pressure half-time: 1.35cm^2/m^2. Mean gradient: 5mm23m (D). Peak gradient: 15mm70m(D).  ------------------------------------------------------------ Left atrium: The atrium was severely dilated.  ------------------------------------------------------------ Right ventricle: The cavity size was normal. Wall  thickness was normal. Pacer wire or catheter noted in right ventricle. Systolic function was reduced.  ------------------------------------------------------------ Ventricular septum: Septal motion showed paradox. These changes are consistent with right ventricular pacing.  ------------------------------------------------------------ Pulmonic valve: The valve appears to be grossly normal. Doppler: Trivial regurgitation.  ------------------------------------------------------------ Tricuspid valve: Structurally normal valve. Leaflet separation was normal. Doppler: Transvalvular velocity was within the normal range. Mild regurgitation.  ------------------------------------------------------------ Pulmonary artery: Systolic pressure was mildly increased.  ------------------------------------------------------------ Right atrium: The atrium was mildly dilated. Pacer wire or catheter noted in right atrium.  ------------------------------------------------------------ Pericardium: There was no pericardial effusion.  ------------------------------------------------------------ Systemic veins: Inferior vena cava: The vessel was dilated; the respirophasic diameter changes were blunted (< 50%); findings are consistent with elevated central venous pressure.  gated cardiac CTA:  FINDINGS: Aortic Valve:  Trileaflet and all 3 leaflets heavily calcified especially the non coronary cusp. There is extensive calcification of the sinotubular junction as well as the annulus extending into the  Anterior mitral leaflet  Sinotubular Junction: 3.4 cm  Ascending Thoracic Aorta: 4.5 cm with extensive calcification over the greater curvature  Descending Thoracic Aorta: 2.4 cm tortuous with scattered calcifications  Sinus of Valsalva Measurements:  Non-coronary: 33.4 mm  Right -coronary: 33.7 cm  Left -coronary: 34.8 cm  Coronary Artery Height above Annulus:  Left Main: 12.6  mm  Right Coronary: 13.5 mm  Virtual Basal Annulus Measurements:  Maximum/Minimum Diameter: 29.7/21.4 at 35% phase  Perimeter: 85.4 mm  Area: 570 mm2  Optimum Fluoroscopic Angle for Delivery: LAO 21 degrees Cranial 0 degrees Alternatively AP with 22 degrees Caudal  IMPRESSION: 1) Severely calcified trileaflet aortic valve suitable for 29 mm Sapien XT valve. Unfavorable calcification at the sinotubular junction and annulus which extends through the anterior mitral leaflet  2) Mild aortic root dilatation with severe calcification of the anterior curvature  3) Coronary Artery Heights suitable for TAVR  4) Optimal Angiographic delivery angle LAO 21 degrees Cranial 0, or AP with 22 degrees Caudal angulation  5) Huge Hiatal hernia also involving the transverse colon which would preclude TEE guidance of TAVR procedure  CTA of the abdomen and pelvis:  VASCULAR MEASUREMENTS PERTINENT TO TAVR:  AORTA:  Minimal Aortic Diameter - 16 x 14 mm mm  Severity of Aortic Calcification - Severe  RIGHT PELVIS:  Right Common Iliac Artery -  Minimal Diameter - 10.4 x 10.8 mm  Tortuosity - Moderate  Calcification - Severe  Right External Iliac Artery -  Minimal Diameter - 10.1 x 10.3 mm  Tortuosity - Moderate  Calcification - Mild to moderate  Right Common Femoral Artery -  Minimal Diameter - 7.9 x 6.5 mm  Tortuosity - Mild  Calcification - Severe  LEFT PELVIS:  Left Common Iliac Artery -  Minimal Diameter - 11.4 x 12.1 mm  Tortuosity - Moderate  Calcification - Severe  Left External Iliac Artery -  Minimal Diameter - 7.3 x 12.1 mm  Tortuosity - Mild to moderate  Calcification - Moderate  Left Common Femoral Artery -  Minimal Diameter - 6.6 x 8.0 mm  Tortuosity - Mild  Calcification - Severe  Review of the MIP images confirms the above findings.  IMPRESSION: 1. Findings and measurements pertinent to potential TAVR procedure, as detailed above.  Although the patient does appear to have suitable pelvic arterial access, the patient's common femoral arteries are severely calcified bilaterally (right greater than left). 2. Massive hiatal hernia with predominantly intrathoracic stomach, as well as a large portion of the transverse colon which is intrathoracic located between stomach and the adjacent left atrium. This may have implications for intraprocedural transesophageal echocardiography.  CARDIAC CATH NOTE  03/24/2014 Name: [name] [name]  MRN: [id number]  DOB: 1928/12/10   Procedure: IVUS, rotational atherectomy, PTCA and stenting of the proximal LAD  Indication: Severe proximal LAD stenosis. 58 -year-old gentleman with known CAD and previous mitral valve replacement/single vessel CABG has been undergoing evaluation because of progressive exertional dyspnea. He was found to have severe proximal LAD stenosis with an occluded vein graft to the LAD. The patient also has severe aortic stenosis. After careful discussion and extensive evaluation, we have elected to proceed with PCI of the proximal LAD using rotational atherectomy. The patient is also being considered for transcatheter aortic valve replacement. His LAD stenosis requires treatment not only  for symptom relief, but also to allow for TAVR to be performed. The patient has been evaluated by cardiac surgery and he is not a candidate for redo sternotomy.  Procedural Details: The right groin was prepped, draped, and anesthetized with 1% lidocaine. Using the modified Seldinger technique, a 6 Fr sheath was introduced into the right femoral artery. Weight-based bivalirudin was given for anticoagulation. Once a therapeutic ACT was achieved, a 6 [place] CLS-4.0 guide catheter was inserted. Initially, the angiographic appearance of the ostial LAD looked less severe than the previous study. I suspected this was due to better opacification with a guide catheter. I elected to perform IVUS to be certain  the lesion was severely diseased. A cougar coronary guidewire was used to cross the lesion. IC NTG was administered. An IVUS probe was advanced across the lesion and a mechanical pullback was performed. This confirmed heavy calcification and very severe stenosis back to the ostium of the LAD. The plaque was occlusive with the IVUS catheter. We then set up for rotational atherectomy. A Rotafloppy wire was advanced across the lesion into the mid LAD. A 1.5 mm burr was platformed outside the body at 165,000 rpm. The verbal was then advanced through the guide catheter and a total of 3 runs were made to treat the lesion. Following rotational atherectomy, The lesion was predilated with a 2.75 mm cutting balloon. The lesion was then stented with a 3.0 x 8 mm Xience Alpine drug-eluting stent. I tried to advance the IVUS catheter for post-procedural IVUS but it would not advance far enough to evaluate stent apposition. The stent was postdilated with a 3.25 mm noncompliant balloon to 16 atm. Following PCI, there was 0% residual stenosis and TIMI-3 flow. Final angiography confirmed an excellent result. The patient tolerated the procedure well. There were no immediate procedural complications. Femoral hemostasis will be achieved with manual pressure. The patient was transferred to the post catheterization recovery area for further monitoring.  Lesion Data:  Vessel: LAD/proximal (ostial)  Percent stenosis (pre): 80  TIMI-flow (pre): 3  Stent: 3.0x8 mm Xience Alpine DES  Percent stenosis (post): 0  TIMI-flow (post): 3   Conclusions: Successful PCI of the proximal LAD with a DES using adjunctive rotational atherectomy.  Recommendations: DAPT with ASA/Plavix x 12 months, continued evaluation by multidisciplinary valve team for consideration of TAVR.  [name]  03/24/2014, 9:26 AM   ASSESSMENT AND PLAN:  This is an 80 year-old male with complex coronary and valvular heart disease. He has undergone PCI of the  proximal LAD and his residual CAD Will be treated medically. We plan to further assess the patient's aortic stenosis with a dobutamine echo to evaluate for low-flow AS. His echo studies have been reviewed and he has very severe calcification and restricted mobility of the valve leaflets. With his decreased EF and paced rhythm, I suspect low-gradient severe AS is likely. Pending the results of his dobutamine echo, he will be scheduled for transcatheter AVR for treatment of severe symptomatic AS and NYHA Class 3 CHF. Risks, potential benefits, and alternatives have been reviewed with the patient and his wife at length over several discussions.   The patient has been advised of a variety of potential complications that might develop, including but not limited to death, stroke, MI, paravalvular leak, aortic dissection, vascular injury, bleeding, infection, arrhythmia, heart block or bradycardia, CHF, respiratory failure, renal failure, valve embolization, or other late complications related to structural valve deterioration or migration. He provides full informed consent for the  procedure as described and all questions were answered.   [name] 04/06/2014 10:26 PM

## 2014-04-22 NOTE — Interval H&P Note (Signed)
History and Physical Interval Note:  04/22/2014 7:27 AM  [name] [name]  has presented today for surgery, with the diagnosis of SEVERE AS  The various methods of treatment have been discussed with the patient and family. After consideration of risks, benefits and other options for treatment, the patient has consented to  Procedure(s) with comments: TRANSCATHETER AORTIC VALVE REPLACEMENT, TRANSFEMORAL (N/A) - TAVR-TF (RIGHT SIDE) INTRAOPERATIVE TRANSTHORACIC ECHOCARDIOGRAM (N/A) as a surgical intervention .  The patient's history has been reviewed, patient examined, no change in status, stable for surgery.  I have reviewed the patient's chart and labs.  Questions were answered to the patient's satisfaction.    Final case review done with team. Plans for right transfemoral TAVR, 29 mm Sapien XT valve. Pt evaluated and reports no change in symptoms since his recent office evaluation. All questions answered.   [name]

## 2014-04-22 NOTE — Op Note (Signed)
[facility]  TAVR OPERATIVE NOTE   Date of Procedure:  04/22/2014  Preoperative Diagnosis: Severe Aortic Stenosis   Postoperative Diagnosis: Same   Procedure:    Transcatheter Aortic Valve Replacement - Transfemoral Approach  Edwards Sapien XT THV (size 29 mm, model # 9300TFX, serial # [id number])   Co-Surgeons:  Valentina Gu. Roxy Manns, MD and [name], MD  Assistants:   Gaye Pollack, MD   Anesthesiologist:  Annye Asa, MD  Echocardiographer:  Ena Dawley, MD  Pre-operative Echo Findings:  Severe aortic stenosis  Mild segmental left ventricular systolic dysfunction  Normal prosthetic mitral valve function  Post-operative Echo Findings (limited transthoracic study):  Trace paravalvular leak  No change in left ventricular systolic function   BRIEF CLINICAL NOTE AND INDICATIONS FOR SURGERY  [name] [name] is an 80 year-old male with severe symptomatic aortic stenosis, confirmed by recent dobutamine echo. His hx is well-documented in multiple consult notes (bioprosthetic MVR, CAD with recent rotational atherectomy/stenting of the prox LAD, progressive dyspnea, and sever low-gradient AS).   During the course of the patient's preoperative work up they have been evaluated comprehensively by a multidisciplinary team of specialists coordinated through the [facility] in the [facility].  They have been demonstrated to suffer from symptomatic severe aortic stenosis as noted above. The patient has been counseled extensively as to the relative risks and benefits of all options for the treatment of severe aortic stenosis including long term medical therapy, conventional surgery for aortic valve replacement, and transcatheter aortic valve replacement.  The patient has been independently evaluated by two cardiac surgeons including Dr Roxy Manns and Dr. Cyndia Bent, and they are felt to be at high risk for conventional surgical  aortic valve replacement based upon a predicted risk of mortality using the Society of Thoracic Surgeons risk calculator of 5.1%. His actual surgical risk is thought to be much higher than that predicted by the STS risk calculator. 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   Based upon review of all of the patient's preoperative diagnostic tests they are felt to be candidate for transcatheter aortic valve replacement using the transfemoral approach as an alternative to high risk conventional surgery.    Following the decision to proceed with transcatheter aortic valve replacement, a discussion has been held regarding what types of management strategies would be attempted intraoperatively in the event of life-threatening complications, including whether or not the patient would be considered a candidate for the use of cardiopulmonary bypass and/or conversion to open sternotomy for attempted surgical intervention.  The patient has been advised of a variety of complications that might develop peculiar to this approach including but not limited to risks of death, stroke, paravalvular leak, aortic dissection or other major vascular complications, aortic annulus rupture, device embolization, cardiac rupture or perforation, acute myocardial infarction, arrhythmia, heart block or bradycardia requiring permanent pacemaker placement, congestive heart failure, respiratory failure, renal failure, pneumonia, infection, other late complications related to structural valve deterioration or migration, or other complications that might ultimately cause a temporary or permanent loss of functional independence or other long term morbidity.  The patient provides full informed consent for the procedure as  described and all questions were answered preoperatively.    DETAILS OF THE OPERATIVE PROCEDURE  PREPARATION:    The  patient is brought to the operating room on the above mentioned date and central monitoring was established by the anesthesia team including placement of Swan-Ganz catheter and radial arterial line. The patient is placed in the supine position on the operating table.  Intravenous antibiotics are administered. General endotracheal anesthesia is induced uneventfully. A Foley catheter is placed.  Baseline transesophageal echocardiogram was performed. The patient's chest, abdomen, both groins, and both lower extremities are prepared and draped in a sterile manner. A time out procedure is performed.   PERIPHERAL ACCESS:    Using the modified Seldinger technique, femoral arterial and venous access was obtained with placement of 6 Fr sheaths on the left side.  A pigtail diagnostic catheter was passed through the left femoral arterial sheath under fluoroscopic guidance into the aortic root.  A temporary transvenous pacemaker catheter was passed through the left femoral venous sheath under fluoroscopic guidance into the right ventricle.  The pacemaker was tested to ensure stable lead placement and pacemaker capture. Aortic root angiography was performed in order to determine the optimal angiographic angle for valve deployment.   TRANSFEMORAL ACCESS:   A right femoral arterial cutdown was performed by Dr Roxy Manns. Please see his separate operative note for details. The patient was heparinized systemically and ACT verified > 250 seconds.    A 20 Fr transfemoral sheath was introduced into the right femoral artery after progressively dilating over an Amplatz superstiff wire. An AL-1 catheter was used to direct a straight-tip exchange length wire across the native aortic valve into the left ventricle. This was exchanged out for a pigtail catheter and position was confirmed in the LV apex.  Simultaneous LV and Ao pressures were recorded.  The pigtail catheter was then exchanged for an Amplatz Extra-stiff wire in the LV apex. At that point, BAV was performed using a 25 mm valvuloplasty balloon.  Once optimal position was achieved, BAV was done under rapid ventricular pacing at 180 bpm. The patient recovered well hemodynamically.   TRANSCATHETER HEART VALVE DEPLOYMENT:  An Edwards Sapien XT THV (size 29 mm) was prepared and crimped per manufacturer's guidelines, and the proper orientation of the valve is confirmed on the [employer] delivery system. The valve was advanced through the introducer sheath using normal technique until in an appropriate position in the abdominal aorta beyond the sheath tip. The balloon was then retracted and using the fine-tuning wheel was centered on the valve. The valve was then advanced across the aortic arch using appropriate flexion of the catheter. The valve was carefully positioned across the aortic valve annulus. The Novaflex catheter was retracted using normal technique. Once final position of the valve has been confirmed by angiographic assessment, the valve is deployed while temporarily holding ventilation and during rapid ventricular pacing to maintain systolic blood pressure < 50 mmHg and pulse pressure < 10 mmHg. The balloon inflation is held for >3 seconds after reaching full deployment volume. Once the balloon has fully deflated the balloon is retracted into the ascending aorta and valve function is assessed using 2D surface echo imaging. There is felt to be trace paravalvular leak and no central aortic insufficiency.  The patient's hemodynamic recovery following valve deployment is good.  The deployment balloon and guidewire are both removed. Echo demostrated trace AI. Aortography confirmed no greater than trace AI.   PROCEDURE COMPLETION:  The sheath was then removed and arteriotomy repaired by Dr Roxy Manns. Please see his separate report for  details. Distal abdominal aortography was performed to evaluate for  any arterial injury related to the procedure. There was no evidence dissection, perforation, or other vascular injury in the abdominal aorta, iliac artery, or femoral artery.  Protamine was administered once femoral arterial repair was complete. The temporary pacemaker, pigtail catheters and femoral sheaths were removed with manual pressure used for hemostasis.   The patient tolerated the procedure well and is transported to the surgical intensive care in stable condition. There were no immediate intraoperative complications. All sponge instrument and needle counts are verified correct at completion of the operation.   100 ml cell saver was administered during the operation.  The patient received a total of 102 mL of intravenous contrast during the procedure.  [name] 04/22/2014 11:29 AM

## 2014-04-22 NOTE — Progress Notes (Signed)
  Echocardiogram 2D Echocardiogram has been performed.  [name] [name] 04/22/2014, 9:11 AM

## 2014-04-22 NOTE — Transfer of Care (Signed)
Immediate Anesthesia Transfer of Care Note  Patient: [name] [name]  Procedure(s) Performed: Procedure(s) with comments: TRANSCATHETER AORTIC VALVE REPLACEMENT, TRANSFEMORAL (N/A) - TAVR-TF (RIGHT SIDE) INTRAOPERATIVE TRANSTHORACIC ECHOCARDIOGRAM (N/A)  Patient Location: ICU  Anesthesia Type:General  Level of Consciousness: awake, alert  and oriented  Airway & Oxygen Therapy: Patient Spontanous Breathing and Patient connected to face mask oxygen  Post-op Assessment: Report given to PACU RN  Post vital signs: Reviewed and stable  Complications: No apparent anesthesia complications

## 2014-04-22 NOTE — Addendum Note (Signed)
Addendum created 04/22/14 1603 by Lavina Hamman, CRNA   Modules edited: Charges VN

## 2014-04-22 NOTE — Anesthesia Postprocedure Evaluation (Signed)
  Anesthesia Post-op Note  Patient: [name] [name]  Procedure(s) Performed: Procedure(s) with comments: TRANSCATHETER AORTIC VALVE REPLACEMENT, TRANSFEMORAL (N/A) - TAVR-TF (RIGHT SIDE) INTRAOPERATIVE TRANSTHORACIC ECHOCARDIOGRAM (N/A)  Patient Location: SICU  Anesthesia Type:General  Level of Consciousness: awake, alert , oriented and patient cooperative  Airway and Oxygen Therapy: Patient Spontanous Breathing and Patient connected to nasal cannula oxygen  Post-op Pain: none  Post-op Assessment: Post-op Vital signs reviewed, Patient's Cardiovascular Status Stable, Respiratory Function Stable, Patent Airway and Pain level controlled  Post-op Vital Signs: Reviewed and stable  Last Vitals:  Filed Vitals:   04/22/14 0931  Pulse: 7  Temp:   Resp:     Complications: No apparent anesthesia complications

## 2014-04-22 NOTE — Anesthesia Procedure Notes (Signed)
Anesthesia Procedure Note PA catheter:  Routine monitors. Timeout, sterile prep, drape, FBP R neck.  Trendelenburg position.  1% [place]ido local, finder and trocar RIJ 1st pass with US guidance.  Cordis placed over J wire. PA catheter in easily.  Sterile dressing applied.  Patient tolerated well, VSS.  Jenita Seashore, MD   06:55-07:08

## 2014-04-23 ENCOUNTER — Inpatient Hospital Stay (HOSPITAL_COMMUNITY): Payer: Medicare Other

## 2014-04-23 ENCOUNTER — Encounter (HOSPITAL_COMMUNITY): Payer: Self-pay | Admitting: Cardiovascular Disease

## 2014-04-23 DIAGNOSIS — I359 Nonrheumatic aortic valve disorder, unspecified: Principal | ICD-10-CM

## 2014-04-23 DIAGNOSIS — I059 Rheumatic mitral valve disease, unspecified: Secondary | ICD-10-CM

## 2014-04-23 LAB — BASIC METABOLIC PANEL
BUN: 19 mg/dL (ref 6–23)
CALCIUM: 8.1 mg/dL — AB (ref 8.4–10.5)
CHLORIDE: 106 meq/L (ref 96–112)
CO2: 24 meq/L (ref 19–32)
Creatinine, Ser: 0.75 mg/dL (ref 0.50–1.35)
GFR calc Af Amer: >90 mL/min (ref >90)
GFR calc non Af Amer: 83 mL/min — ABNORMAL LOW (ref >90)
Glucose, Bld: 108 mg/dL — ABNORMAL HIGH (ref 70–99)
Potassium: 4.3 mEq/L (ref 3.7–5.3)
SODIUM: 140 meq/L (ref 137–147)

## 2014-04-23 LAB — CBC
HCT: 30 % — ABNORMAL LOW (ref 39.0–52.0)
HEMOGLOBIN: 9.8 g/dL — AB (ref 13.0–17.0)
MCH: 30.8 pg (ref 26.0–34.0)
MCHC: 32.7 g/dL (ref 30.0–36.0)
MCV: 94.3 fL (ref 78.0–100.0)
Platelets: 92 10*3/uL — ABNORMAL LOW (ref 150–400)
RBC: 3.18 MIL/uL — AB (ref 4.22–5.81)
RDW: 14.1 % (ref 11.5–15.5)
WBC: 7.5 10*3/uL (ref 4.0–10.5)

## 2014-04-23 LAB — GLUCOSE, CAPILLARY
GLUCOSE-CAPILLARY: 102 mg/dL — AB (ref 70–99)
GLUCOSE-CAPILLARY: 112 mg/dL — AB (ref 70–99)
Glucose-Capillary: 106 mg/dL — ABNORMAL HIGH (ref 70–99)
Glucose-Capillary: 107 mg/dL — ABNORMAL HIGH (ref 70–99)

## 2014-04-23 LAB — MAGNESIUM: Magnesium: 2.3 mg/dL (ref 1.5–2.5)

## 2014-04-23 MED ORDER — METOPROLOL SUCCINATE ER 50 MG PO TB24
50.0000 mg | ORAL_TABLET | Freq: Every day | ORAL | Status: DC
  Administered 2014-04-24: 50 mg via ORAL
  Filled 2014-04-23: qty 1

## 2014-04-23 MED FILL — Magnesium Sulfate Inj 50%: INTRAMUSCULAR | Qty: 10 | Status: AC

## 2014-04-23 MED FILL — Heparin Sodium (Porcine) Inj 1000 Unit/ML: INTRAMUSCULAR | Qty: 30 | Status: AC

## 2014-04-23 MED FILL — Dexmedetomidine HCl IV Soln 200 MCG/2ML: INTRAVENOUS | Qty: 2 | Status: AC

## 2014-04-23 MED FILL — Potassium Chloride Inj 2 mEq/ML: INTRAVENOUS | Qty: 40 | Status: AC

## 2014-04-23 MED FILL — Insulin Regular (Human) Inj 100 Unit/ML: INTRAMUSCULAR | Qty: 1 | Status: AC

## 2014-04-23 NOTE — Progress Notes (Signed)
      [address]       [place] [address]             [phone]        CARDIOTHORACIC SURGERY PROGRESS NOTE   R1 Day Post-Op Procedure(s) (LRB): TRANSCATHETER AORTIC VALVE REPLACEMENT, TRANSFEMORAL (N/A) INTRAOPERATIVE TRANSTHORACIC ECHOCARDIOGRAM (N/A)  Subjective:  Looks great. No pain. No SOB. Just ate his entire breakfast.  Objective: Vital signs: BP Readings from Last 1 Encounters:  04/23/14 104/65   Pulse Readings from Last 1 Encounters:  04/23/14 70   Resp Readings from Last 1 Encounters:  04/23/14 17   Temp Readings from Last 1 Encounters:  04/23/14 98 F (36.7 C) Oral    Hemodynamics: PAP: (35-38)/(18-24) 38/20 mmHg CO:  [4.1 L/min] 4.1 L/min CI:  [2.1 L/min/m2] 2.1 L/min/m2  Physical Exam:  Rhythm:   AV paced  Breath sounds: clear  Heart sounds:  RRR w/out murmur  Incisions:  Clean and dry  Abdomen:  soft  Extremities:  warm   Intake/Output from previous day: 06/16 0701 - 06/17 0700 In: 3240 [I.V.:2490; Blood:100; IV Piggyback:650] Out: 1235 [Urine:835; Blood:400] Intake/Output this shift:    Lab Results:  CBC: Recent Labs  04/22/14 1645 04/22/14 1649 04/23/14 0340  WBC 8.5  --  7.5  HGB 10.8* 10.2* 9.8*  HCT 32.5* 30.0* 30.0*  PLT 87*  --  92*    BMET:  Recent Labs  04/22/14 1649 04/23/14 0340  NA 142 140  K 4.0 4.3  CL 108 106  CO2  --  24  GLUCOSE 110* 108*  BUN 19 19  CREATININE 0.80 0.75  CALCIUM  --  8.1*     CBG (last 3)   Recent Labs  04/22/14 2340 04/23/14 0322 04/23/14 0729  GLUCAP 107* 106* 102*    ABG    Component Value Date/Time   PHART 7.390 04/22/2014 1106   PCO2ART 40.1 04/22/2014 1106   PO2ART 107.0* 04/22/2014 1106   HCO3 24.4* 04/22/2014 1106   TCO2 24 04/22/2014 1649   ACIDBASEDEF 1.0 04/22/2014 1106   O2SAT 98.0 04/22/2014 1106    CXR: PORTABLE CHEST - 1 VIEW  COMPARISON: April 22, 2014.  FINDINGS:  Stable cardiomegaly. Sternotomy wires are again noted. Left-sided    pacemaker is unchanged. Right internal jugular Swan-Ganz catheter  has been removed; venous sheath remains. No pneumothorax is noted.  Old left clavicular fracture is noted. Status post aortic valve  repair. Minimal left pleural effusion is noted. New lingular opacity  is noted concerning for subsegmental atelectasis or less likely  pneumonia.  IMPRESSION:  New lingular opacity is noted concerning for subsegmental  atelectasis or less likely pneumonia. Status post aortic valve  repair.  Electronically Signed  By: Sabino Dick M.D.  On: 04/23/2014 08:18   Assessment/Plan: S/P Procedure(s) (LRB): TRANSCATHETER AORTIC VALVE REPLACEMENT, TRANSFEMORAL (N/A) INTRAOPERATIVE TRANSTHORACIC ECHOCARDIOGRAM (N/A)  Doing very well POD1 Expected post op acute blood loss anemia, mild Expected post op atelectasis, mild   Continue routine postop  Anticipate early d/c home  I spent in excess of 15 minutes during the conduct of this hospital encounter and >50% of this time involved direct face-to-face encounter with the patient for counseling and/or coordination of their care.   OWEN,CLARENCE H 04/23/2014 8:52 AM

## 2014-04-23 NOTE — Progress Notes (Addendum)
    Subjective:  Feels well this am. No complaints. Denies CP, dyspnea.  Objective:  Vital Signs in the last 24 hours: Temp:  [97.2 F (36.2 C)-98.2 F (36.8 C)] 98 F (36.7 C) (06/17 0325) Pulse Rate:  [7-70] 68 (06/17 0700) Resp:  [14-29] 22 (06/17 0700) BP: (90-122)/(54-95) 100/77 mmHg (06/17 0700) SpO2:  [93 %-100 %] 95 % (06/17 0700) Arterial Line BP: (86-135)/(45-69) 116/55 mmHg (06/17 0600) Weight:  [80.287 kg (177 lb)] 80.287 kg (177 lb) (06/17 0700)  Intake/Output from previous day: 06/16 0701 - 06/17 0700 In: 3240 [I.V.:2490; Blood:100; IV Piggyback:650] Out: 1235 [Urine:835; Blood:400]  Physical Exam: Pt is alert and oriented, NAD, sitting up in chair HEENT: normal Neck: JVP - normal Lungs: CTA bilaterally CV: RRR with soft systolic murmur at the RUSB Abd: soft, NT, Positive BS, no hepatomegaly Ext: 1+ pretibial edema bilaterally unchanged from baseline with scaling and chronic stasis changes  Lab Results:  Recent Labs  04/22/14 1645 04/22/14 1649 04/23/14 0340  WBC 8.5  --  7.5  HGB 10.8* 10.2* 9.8*  PLT 87*  --  92*    Recent Labs  04/22/14 1649 04/23/14 0340  NA 142 140  K 4.0 4.3  CL 108 106  CO2  --  24  GLUCOSE 110* 108*  BUN 19 19  CREATININE 0.80 0.75   No results found for this basename: TROPONINI, CK, MB,  in the last 72 hours  Tele: V-paced, no arrhythmia  Assessment/Plan:  1. Severe symptomatic AS now POD from TAVR 2. CAD s/p recent PCI 3. HTN 4. PAF, maintaining sinus rhythm 5. Thrombocytopenia, mild.  6. Post-op anemia, stable with HgB 9.8 this am  Looks remarkably well today. D/C lines, foley, tx to SDU. Continue low-dose ASA and plavix. Check 2D Echo today. Phase 1 cardiac rehab today. BP a little soft so will reduce Toprol XL to 50 mg (100 mg home dose). Repeat CBC tomorrow and anticipate home in am.  [name], M.D. 04/23/2014, 8:34 AM

## 2014-04-23 NOTE — Progress Notes (Signed)
  Echocardiogram 2D Echocardiogram has been performed.  Basilia Jumbo 04/23/2014, 12:36 PM

## 2014-04-23 NOTE — Significant Event (Signed)
Ambulated with patient out in the halls, walked 200 feet. Patient returned to room and sat in recliner chair. Call-bell within reach. Hyiu Ksor, Therapist, sports.

## 2014-04-23 NOTE — Significant Event (Signed)
1420-Accepted patient into 2W34 from 2S. Patient traveled via wheelchair to room, accompanied by RN Richardson Landry. Patient's family at bedside. Patient oriented to room and unit. Will continue to monitor. Tamantha Saline, Therapist, sports.

## 2014-04-24 ENCOUNTER — Other Ambulatory Visit: Payer: Self-pay | Admitting: Cardiology

## 2014-04-24 ENCOUNTER — Telehealth: Payer: Self-pay | Admitting: Cardiology

## 2014-04-24 DIAGNOSIS — D696 Thrombocytopenia, unspecified: Secondary | ICD-10-CM

## 2014-04-24 LAB — BASIC METABOLIC PANEL
BUN: 20 mg/dL (ref 6–23)
CHLORIDE: 106 meq/L (ref 96–112)
CO2: 27 mEq/L (ref 19–32)
Calcium: 8.4 mg/dL (ref 8.4–10.5)
Creatinine, Ser: 0.87 mg/dL (ref 0.50–1.35)
GFR, EST AFRICAN AMERICAN: 90 mL/min — AB (ref >90)
GFR, EST NON AFRICAN AMERICAN: 78 mL/min — AB (ref >90)
Glucose, Bld: 105 mg/dL — ABNORMAL HIGH (ref 70–99)
POTASSIUM: 4.4 meq/L (ref 3.7–5.3)
SODIUM: 141 meq/L (ref 137–147)

## 2014-04-24 LAB — CBC
HCT: 30.5 % — ABNORMAL LOW (ref 39.0–52.0)
Hemoglobin: 10 g/dL — ABNORMAL LOW (ref 13.0–17.0)
MCH: 31.1 pg (ref 26.0–34.0)
MCHC: 32.8 g/dL (ref 30.0–36.0)
MCV: 94.7 fL (ref 78.0–100.0)
PLATELETS: 77 10*3/uL — AB (ref 150–400)
RBC: 3.22 MIL/uL — ABNORMAL LOW (ref 4.22–5.81)
RDW: 14.2 % (ref 11.5–15.5)
WBC: 6.8 10*3/uL (ref 4.0–10.5)

## 2014-04-24 MED ORDER — METOPROLOL SUCCINATE ER 50 MG PO TB24: 50.0000 mg | ORAL_TABLET | Freq: Every day | ORAL | Status: DC

## 2014-04-24 NOTE — Progress Notes (Signed)
D/c instructions reviewed with pt and his family. Copy of instructions given to pt. Discussed daily weights and when to call MD. Pt d/c'd via wheelchair with belongings, with family, escorted by unit staff.

## 2014-04-24 NOTE — Progress Notes (Addendum)
      [address]       [place] [address]             [phone]      2 Days Post-Op Procedure(s) (LRB): TRANSCATHETER AORTIC VALVE REPLACEMENT, TRANSFEMORAL (N/A) INTRAOPERATIVE TRANSTHORACIC ECHOCARDIOGRAM (N/A)  Subjective:  [name] [name] has no complaints this morning.  He state he is being discharged today.  Objective: Vital signs in last 24 hours: Temp:  [97.4 F (36.3 C)-98.9 F (37.2 C)] 97.4 F (36.3 C) (06/18 0515) Pulse Rate:  [69-71] 70 (06/18 0515) Cardiac Rhythm:  [-] A-V Sequential paced (06/17 2000) Resp:  [16-20] 20 (06/18 0515) BP: (83-121)/(41-78) 107/61 mmHg (06/18 0515) SpO2:  [94 %-99 %] 94 % (06/18 0515) Weight:  [175 lb 12.8 oz (79.742 kg)] 175 lb 12.8 oz (79.742 kg) (06/18 0515)  Intake/Output from previous day: 06/17 0701 - 06/18 0700 In: 690 [P.O.:600; I.V.:40; IV Piggyback:50] Out: 990 [Urine:990]  General appearance: alert, cooperative and no distress Heart: regular rate and rhythm Lungs: clear to auscultation bilaterally Wound: clean and dry  Lab Results:  Recent Labs  04/22/14 1645 04/22/14 1649 04/23/14 0340  WBC 8.5  --  7.5  HGB 10.8* 10.2* 9.8*  HCT 32.5* 30.0* 30.0*  PLT 87*  --  92*   BMET:  Recent Labs  04/22/14 1649 04/23/14 0340  NA 142 140  K 4.0 4.3  CL 108 106  CO2  --  24  GLUCOSE 110* 108*  BUN 19 19  CREATININE 0.80 0.75  CALCIUM  --  8.1*    PT/INR:  Recent Labs  04/22/14 1100  LABPROT 16.4*  INR 1.36   ABG    Component Value Date/Time   PHART 7.390 04/22/2014 1106   HCO3 24.4* 04/22/2014 1106   TCO2 24 04/22/2014 1649   ACIDBASEDEF 1.0 04/22/2014 1106   O2SAT 98.0 04/22/2014 1106   CBG (last 3)   Recent Labs  04/23/14 0322 04/23/14 0729 04/23/14 1143  GLUCAP 106* 102* 112*    Assessment/Plan: S/P Procedure(s) (LRB): TRANSCATHETER AORTIC VALVE REPLACEMENT, TRANSFEMORAL (N/A) INTRAOPERATIVE TRANSTHORACIC ECHOCARDIOGRAM (N/A)  1. CV- Hemodynamically stable 2. Acute  post operative blood loss anemia-mild, stable 3. Dispo- care per Cardiology, possibly d/c home today   LOS: 2 days    [name] [name] 04/24/2014  I have seen and examined the patient and agree with the assessment and plan as outlined.  [name] [name] 04/24/2014 8:18 AM

## 2014-04-24 NOTE — Discharge Summary (Signed)
See my progress note this same date. Needs TOC visit within 2 weeks and repeat CBC next week.

## 2014-04-24 NOTE — Care Management Note (Signed)
    Page 1 of 1   04/24/2014     4:26:44 PM CARE MANAGEMENT NOTE 04/24/2014  Patient:  RA, PFIESTER   Account Number:  [id number]  Date Initiated:  04/22/2014  Documentation initiated by:  MAYO,HENRIETTA  Subjective/Objective Assessment:   s/p TAVR; lives with spouse    PCP  [name]     Action/Plan:   Anticipated DC Date:  04/24/2014   Anticipated DC Plan:  [place]  CM consult      Choice offered to / List presented to:             Status of service:  Completed, signed off Medicare Important Message given?  NA - LOS <3 / Initial given by admissions (If response is "NO", the following Medicare IM given date fields will be blank) Date Medicare IM given:   Date Additional Medicare IM given:    Discharge Disposition:  HOME/SELF CARE  Per UR Regulation:  Reviewed for med. necessity/level of care/duration of stay  If discussed at [place] of Stay Meetings, dates discussed:    Comments:  [place], [place] [phone]   [phone]

## 2014-04-24 NOTE — Progress Notes (Signed)
CARDIAC REHAB PHASE I   PRE:  Rate/Rhythm: 77 pacing on demand    BP: sitting 121/65    SaO2: 95 RA  MODE:  Ambulation: 450 ft   POST:  Rate/Rhythm: 74 pacing    BP: sitting 122/73     SaO2: 96 RA  Ambulated with min assist, no device. Pt has habit of walking quickly, encouraged more controlled pace to ensure stability. Pt sts he feels more secure in his shoes. Ready for d/c. Ed completed including ex gl and continued discussion of CPRII. Plans to attend [place] [facility], referral already sent.  [phone]   Darrick Meigs CES, ACSM 04/24/2014 9:51 AM

## 2014-04-24 NOTE — Telephone Encounter (Signed)
TCM Phone Call. [name] [name] [phone], Cell [phone]

## 2014-04-24 NOTE — Discharge Summary (Signed)
Physician Discharge Summary  Patient ID: [name] [name] MRN: [id number] DOB/AGE: November 16, 1928 80 y.o.  Admit date: 04/22/2014 Discharge date: 04/24/2014  Primary Cardiologist: Dr. [name].   Admission Diagnoses: Aortic Valve Disorder  Discharge Diagnoses:  Principal Problem:   S/P TAVR (transcatheter aortic valve replacement) Active Problems:   Aortic valve disorders   Discharged Condition: stable  Hospital Course: The patient is a 80 y/o male, followed by Dr. [name], with a past cardiac history of MVP s/p bioprosthetic MVR in 2009, SSS s/p PPM, CAD s/p single vessel CABG and  recent rotational atherectomy/stenting of the prox LAD on 03/24/14 (DAPT with ASA + Plavix), PAF and severe symptomatic aortic stenosis, which has been followed by Dr. Burt Knack and Dr. Roxy Manns in the [facility]. He had progression of  disease with development of severe symptomatic AS and NYHA Class 3 CHF. He underwent evaluation for consideration for TAVR was was deemed a suitable candidate.  On 04/22/14, he underwent transcatheter aortic valve replacement by right transfemoral approach, performed by both Dr. Burt Knack and Dr. Roxy Manns. He tolerated the procedure well. A post-operative 2D echo demonstrated a well seated Edward Sapien XT THV. There was no central aortic insufficiency and only minimal to mild paravalvular leak. Peak and mean transaortic gradient was 11 and 8 mmHg (normal for this type of prothesis). There were no post-operative complications. He did however develop  thrombocytopenia with a platelet count of 77. Hgb was 10.0 day of discharge. Dr. Burt Knack was notified and ordered for him to get a repeat CBC in 1 week. He was instructed to continue DAPT with ASA and Plavix, in the setting of recent DES.    On post-op day 2, he was last seen and examined by Dr. Burt Knack, who determined he was stable for discharge home. TOC f/u was arranged with Lyda Jester, PA-C. He will also be scheduled for a  repeat post-op echo and 30 day visit with Dr. Burt Knack. Dr. [name] will continue to follow him long term.   Consults: None  Significant Diagnostic Studies:   Post-op 2D echo  Study Conclusions  - Left ventricle: The cavity size was normal. There was mild concentric hypertrophy. Systolic function was normal. The estimated ejection fraction was in the range of 55% to 60%. Wall motion was normal; there were no regional wall motion abnormalities. The study is not technically sufficient to allow evaluation of LV diastolic function. - Ventricular septum: Septal motion showed paradox. - Aortic valve: A 29 mm Edwards Sapien XT THV is seated well. There is no central aortic insufficiency and only minimal to mild paravalvular leak. Peak and mean transaortic gradient are 11 and 8 mmHg, and are normal for this type of prosthesis. - Mitral valve: A 41mm Biocor porcine bioprosthesis is well seated, there is no central regurgitation or paravalvular leak. Transmitral gradients are mildly elevated. The findings are consistent with mild stenosis. Mean gradient (D): 5 mm Hg. - Left atrium: The atrium was severely dilated. - Right ventricle: Pacer wire or catheter noted in right ventricle. - Tricuspid valve: There was mild regurgitation. - Pulmonary arteries: Systolic pressure was mildly increased. PA peak pressure: 41 mm Hg (S).  Impressions:  - Compared to the prior study on 04/11/2014 the left ventricular ejection has improved and is now normal 50-55%, previously 35-40%. A new Edwards Sapien XT THV is seated well with normal transaortic gradients and only minimal paravalvular leak.   Treatments: See Hospital Course  Discharge Exam: Blood pressure 107/61, pulse 70, temperature 97.4  F (36.3 C), temperature source Oral, resp. rate 20, weight 175 lb 12.8 oz (79.742 kg), SpO2 94.00%.   Disposition: 01-Home or Self Care      Discharge Instructions   Diet - low sodium heart healthy     Complete by:  As directed      Increase activity slowly    Complete by:  As directed             Medication List         amiodarone 200 MG tablet  Commonly known as:  PACERONE  Take 200 mg by mouth daily.     aspirin EC 81 MG tablet  Take 81 mg by mouth daily.     clopidogrel 75 MG tablet  Commonly known as:  PLAVIX  Take 75 mg by mouth daily with breakfast.     ferrous sulfate 325 (65 FE) MG tablet  Take 325 mg by mouth daily with breakfast.     methimazole 5 MG tablet  Commonly known as:  TAPAZOLE  Take 5 mg by mouth every Monday, Wednesday, and Friday.     metoprolol succinate 50 MG 24 hr tablet  Commonly known as:  TOPROL-XL  Take 1 tablet (50 mg total) by mouth daily. Take with or immediately following a meal.     multivitamin with minerals Tabs tablet  Take 1 tablet by mouth daily. Centrum Silver     pantoprazole 40 MG tablet  Commonly known as:  PROTONIX  Take 40 mg by mouth 2 (two) times daily.     simvastatin 40 MG tablet  Commonly known as:  ZOCOR  Take 40 mg by mouth at bedtime.     tamsulosin 0.4 MG Caps capsule  Commonly known as:  FLOMAX  Take 0.4 mg by mouth every evening.       Follow-up Information   Follow up with Lyda Jester, PA-C On 05/07/2014. (11:00 am )    Specialty:  Cardiology   Contact information:   [address] [place] [address] [phone]      TIME SPENT ON DISCHARGE, INCLUDING PHYSICIAN TIME: >30 MINUTES  Signed: Lyda Jester 04/24/2014, 12:33 PM

## 2014-04-24 NOTE — Progress Notes (Signed)
    Subjective:  Feels well. No complaints this morning. No chest pain or dyspnea.  Objective:  Vital Signs in the last 24 hours: Temp:  [97.4 F (36.3 C)-98.9 F (37.2 C)] 97.4 F (36.3 C) (06/18 0515) Pulse Rate:  [69-71] 70 (06/18 0515) Resp:  [16-20] 20 (06/18 0515) BP: (83-121)/(41-78) 107/61 mmHg (06/18 0515) SpO2:  [94 %-99 %] 94 % (06/18 0515) Weight:  [79.742 kg (175 lb 12.8 oz)] 79.742 kg (175 lb 12.8 oz) (06/18 0515)  Intake/Output from previous day: 06/17 0701 - 06/18 0700 In: 690 [P.O.:600; I.V.:40; IV Piggyback:50] Out: 51 [Urine:990]  Physical Exam: Pt is alert and oriented, NAD HEENT: normal Neck: JVP - normal Lungs: CTA bilaterally CV: RRR without murmur or gallop Abd: soft, NT, Positive BS, no hepatomegaly Ext: no C/C/E, distal pulses intact and equal, groin sites clear Skin: warm/dry no rash   Lab Results:  Recent Labs  04/22/14 1645 04/22/14 1649 04/23/14 0340  WBC 8.5  --  7.5  HGB 10.8* 10.2* 9.8*  PLT 87*  --  92*    Recent Labs  04/22/14 1649 04/23/14 0340  NA 142 140  K 4.0 4.3  CL 108 106  CO2  --  24  GLUCOSE 110* 108*  BUN 19 19  CREATININE 0.80 0.75   No results found for this basename: TROPONINI, CK, MB,  in the last 72 hours  Cardiac Studies: 2-D echo: Study Conclusions  - Left ventricle: The cavity size was normal. There was mild concentric hypertrophy. Systolic function was normal. The estimated ejection fraction was in the range of 55% to 60%. Wall motion was normal; there were no regional wall motion abnormalities. The study is not technically sufficient to allow evaluation of LV diastolic function. - Ventricular septum: Septal motion showed paradox. - Aortic valve: A 29 mm Edwards Sapien XT THV is seated well. There is no central aortic insufficiency and only minimal to mild paravalvular leak. Peak and mean transaortic gradient are 11 and 8 mmHg, and are normal for this type of prosthesis. - Mitral valve: A  23mm Biocor porcine bioprosthesis is well seated, there is no central regurgitation or paravalvular leak. Transmitral gradients are mildly elevated. The findings are consistent with mild stenosis. Mean gradient (D): 5 mm Hg. - Left atrium: The atrium was severely dilated. - Right ventricle: Pacer wire or catheter noted in right ventricle. - Tricuspid valve: There was mild regurgitation. - Pulmonary arteries: Systolic pressure was mildly increased. PA peak pressure: 41 mm Hg (S).  Impressions:  - Compared to the prior study on 04/11/2014 the left ventricular ejection has improved and is now normal 50-55%, previously 35-40%. A new Edwards Sapien XT THV is seated well with normal transaortic gradients and only minimal paravalvular leak.  Tele: Ventricular pacing, PVCs  Assessment/Plan:  1. Severe symptomatic AS now POD 2 from TAVR  2. CAD s/p recent PCI  3. HTN  4. PAF, maintaining sinus rhythm  5. Thrombocytopenia, mild.  6. Post-op anemia, mild  Repeat CBC this morning. Walk with cardiac rehabilitation this morning. Anticipate discharge home today on dual antiplatelet therapy with aspirin and Plavix. Please arrange transition of care visit with Dr [name]'s PA/NP within 2 weeks and we will arrange post-op echo and 30 day visit. Reviewed restrictions in detail with patient.   [name], M.D. 04/24/2014, 8:03 AM

## 2014-04-25 ENCOUNTER — Other Ambulatory Visit (HOSPITAL_COMMUNITY): Payer: Medicare Other

## 2014-04-27 LAB — TYPE AND SCREEN
ABO/RH(D): B NEG
ANTIBODY SCREEN: NEGATIVE
UNIT DIVISION: 0
UNIT DIVISION: 0
Unit division: 0
Unit division: 0
Unit division: 0
Unit division: 0

## 2014-04-28 ENCOUNTER — Encounter: Payer: Self-pay | Admitting: Cardiovascular Disease

## 2014-04-30 ENCOUNTER — Telehealth: Payer: Self-pay | Admitting: *Deleted

## 2014-04-30 ENCOUNTER — Telehealth: Payer: Self-pay | Admitting: Cardiovascular Disease

## 2014-04-30 NOTE — Telephone Encounter (Signed)
Mr. Delph called to relate having difficulty  voiding and having significant bruising and swelling of his left testicle.  He is s/p TAVR on 04/22/14. I asked if he had a urologist, which he does, and suggested he call him.  I called later to be sure he was able to be seen and he has an appointment for 2 pm today.

## 2014-04-30 NOTE — Telephone Encounter (Signed)
New message           Pt has swelling in his scrotum and bruising on his body / He is also bleeding and would like to speak to a nurse concerning this issue

## 2014-04-30 NOTE — Telephone Encounter (Signed)
Pt calling to tell Dr Burt Knack and Nurse that he has bilateral scrotal edema.  Pt states that he is a post TAVR pt of Dr Cooper's.  Pt had this procedure done on 6/16.  Pt states that everything was presenting as discharge instructions said, until about 2 days ago.  Pt states about 2 days ago his scrotum started to swell and then progressively became worst over time.  Pt states the swelling is so bad, that its obstructing his ability to urinate. Pt states the swelling is causing him much discomfort.  Pt denies any active bleeding at the site.  Pt states he does have a big hematoma at the area.  Pt denies any chest pain, sob, feeling dizzy or faint.  Pt does complain that he is unable to urinate d/t the swelling.  Went to DOD Dr Radford Pax to review and recommend, and she advised pt to go to [facility] ER for further evaluation of femoral site.  Informed pt that Dr Burt Knack and nurse are out of the office today, but I reviewed pts symptoms with our DOD, and she advises him to go to the ED for further eval.  Informed pt that I will call our card master Trish to notify her that pt will be coming to the ED post TAVR problems.  Pt verbalized understanding and agrees with this plan.  Will route this message to Dr Burt Knack and nurse for their review. Trish notified at 9:16 am.

## 2014-05-01 ENCOUNTER — Other Ambulatory Visit: Payer: Self-pay | Admitting: Cardiology

## 2014-05-05 ENCOUNTER — Other Ambulatory Visit: Payer: Self-pay

## 2014-05-05 DIAGNOSIS — I359 Nonrheumatic aortic valve disorder, unspecified: Secondary | ICD-10-CM

## 2014-05-07 ENCOUNTER — Ambulatory Visit (INDEPENDENT_AMBULATORY_CARE_PROVIDER_SITE_OTHER): Payer: Medicare Other | Admitting: Cardiology

## 2014-05-07 ENCOUNTER — Encounter: Payer: Self-pay | Admitting: Cardiology

## 2014-05-07 VITALS — BP 112/70 | HR 70 | Ht 70.0 in | Wt 183.2 lb

## 2014-05-07 DIAGNOSIS — I4891 Unspecified atrial fibrillation: Secondary | ICD-10-CM

## 2014-05-07 DIAGNOSIS — Z952 Presence of prosthetic heart valve: Secondary | ICD-10-CM

## 2014-05-07 DIAGNOSIS — Z954 Presence of other heart-valve replacement: Secondary | ICD-10-CM

## 2014-05-07 DIAGNOSIS — I480 Paroxysmal atrial fibrillation: Secondary | ICD-10-CM

## 2014-05-07 NOTE — Progress Notes (Signed)
Patient ID: [name] [name], male   DOB: 1929-06-17, 80 y.o.   MRN: [id number]    05/07/2014 MARCIAL PLESS   1929-02-03  [id number]  Primary Physicia [name], MD Primary Cardiologist: Dr. Timmie Foerster  HPI:  The patient is a 80 y/o male, followed by Dr. [name], with a past cardiac history of MVP s/p bioprosthetic MVR in 2009, SSS s/p PPM, CAD s/p single vessel CABG and recent rotational atherectomy/stenting of the prox LAD on 03/24/14 (DAPT with ASA + Plavix), PAF and severe symptomatic aortic stenosis, which has been followed by Dr. Burt Knack and Dr. Roxy Manns in the [facility]. He had progression of disease with development of severe symptomatic AS and NYHA Class 3 CHF. He underwent evaluation for consideration for TAVR was was deemed a suitable candidate.   On 04/22/14, he underwent transcatheter aortic valve replacement by right transfemoral approach, performed by both Dr. Burt Knack and Dr. Roxy Manns. He tolerated the procedure well. A post-operative 2D echo demonstrated a well seated Edward Sapien XT THV. There was no central aortic insufficiency and only minimal to mild paravalvular leak. Peak and mean transaortic gradient was 11 and 8 mmHg (normal for this type of prothesis). There were no post-operative complications. He did however develop thrombocytopenia with a platelet count of 77. Hgb was 10.0 day of discharge. Dr. Burt Knack was notified and ordered for him to get a repeat CBC in 1 week. He was instructed to continue DAPT with ASA and Plavix, in the setting of recent DES.   He presents to clinic today for post hospital followup. He states that he feels that he has done remarkably well. He denies any chest pain, shortness of breath, orthopnea, PND, lower extremity edema, dizziness, syncope/near-syncope. He has been fully compliant with both aspirin and Plavix, as well as all of his other prescribed medications. He denies any abnormal bleeding. Does note mild soreness near his femoral  access site, however he denies any significant pain. There has been no drainage or oozing from the site. He denies fevers and chills.  Current Outpatient Prescriptions  Medication Sig Dispense Refill  . amiodarone (PACERONE) 200 MG tablet Take 200 mg by mouth daily.      Marland Kitchen aspirin EC 81 MG tablet Take 81 mg by mouth daily.      . clopidogrel (PLAVIX) 75 MG tablet Take 75 mg by mouth daily with breakfast.      . ferrous sulfate 325 (65 FE) MG tablet Take 325 mg by mouth daily with breakfast.      . methimazole (TAPAZOLE) 5 MG tablet Take 5 mg by mouth every Monday, Wednesday, and Friday.       . metoprolol succinate (TOPROL-XL) 50 MG 24 hr tablet Take 1 tablet (50 mg total) by mouth daily. Take with or immediately following a meal.  30 tablet  5  . Multiple Vitamin (MULTIVITAMIN WITH MINERALS) TABS Take 1 tablet by mouth daily. Centrum Silver      . pantoprazole (PROTONIX) 40 MG tablet Take 40 mg by mouth 2 (two) times daily.      . simvastatin (ZOCOR) 40 MG tablet Take 40 mg by mouth at bedtime.      . Tamsulosin HCl (FLOMAX) 0.4 MG CAPS Take 0.4 mg by mouth every evening.        No current facility-administered medications for this visit.    No Known Allergies  History   Social History  . Marital Status: Married    Spouse Name: N/A  Number of Children: 4  . Years of Education: N/A   Occupational History  . Retired    Social History Main Topics  . Smoking status: Former Smoker -- 2 years    Types: Pipe    Quit date: 11/07/1965  . Smokeless tobacco: Never Used     Comment: quit over 40 years ago, never smoked cigarettes  . Alcohol Use: No  . Drug Use: No  . Sexual Activity: Not on file   Other Topics Concern  . Not on file   Social History Narrative  . No narrative on file     Review of Systems: General: negative for chills, fever, night sweats or weight changes.  Cardiovascular: negative for chest pain, dyspnea on exertion, edema, orthopnea, palpitations,  paroxysmal nocturnal dyspnea or shortness of breath Dermatological: negative for rash Respiratory: negative for cough or wheezing Urologic: negative for hematuria Abdominal: negative for nausea, vomiting, diarrhea, bright red blood per rectum, melena, or hematemesis Neurologic: negative for visual changes, syncope, or dizziness All other systems reviewed and are otherwise negative except as noted above.    Blood pressure 112/70, pulse 70, height 5\' 10"  (1.778 m), weight 183 lb 3.2 oz (83.099 kg).  General appearance: alert, cooperative and no distress Neck: no adenopathy and no carotid bruit Lungs: clear to auscultation bilaterally Heart: regular rate and rhythm, S1, S2 normal, no murmur, click, rub or gallop Extremities: warm and dry Pulses: 2+ and symmetric Skin: warm and dry Neurologic: Grossly normal  EKG normal rhythm. Pacer spikes noted   ASSESSMENT AND PLAN:   1. Severe aortic stenosis: s/p successful TAVR on 04/22/2014. He denies angina, dyspnea or syncope. Femoral access site appears stable. He is scheduled for 30 day follow-up/postop echo with Dr. Burt Knack on 05/22/2014.  2. CAD: He is status post remote single-vessel CABG and recent rotational atherectomy/stenting of the proximal LAD on 03/24/2014. He denies any anginal symptoms. Continue on ASA and Plavix.  3. Thrombocytopenia: Platelet count day of discharge was 77. Dr. Burt Knack was notified and stated that this is a common occurrence post TAVR. He had blood drawn for a repeat CBC several days ago, however his results are not recorded in epic. [facility] was notified and they will fax records to our office.  PLAN   He appears to be doing well post-op. He is scheduled for 30 day followup/postop echo with Dr. Burt Knack on 05/22/2014. He has been instructed to followup with Dr. [name] in 6-8 weeks for reassessment.  Iyannah Blake, BRITTAINYPA-C 05/07/2014 11:13 AM

## 2014-05-07 NOTE — Patient Instructions (Signed)
Continue current medications.  Keep follow-up appointment with Dr. Burt Knack.  Dr. [name] recommends that you schedule a follow-up appointment in: 6-8 weeks.

## 2014-05-08 LAB — CBC WITH DIFFERENTIAL
BASOS ABS: 0 10*3/uL (ref 0.0–0.2)
Basos: 0 %
EOS ABS: 0.1 10*3/uL (ref 0.0–0.4)
Eos: 2 %
HCT: 29.1 % — ABNORMAL LOW (ref 37.5–51.0)
Hemoglobin: 9.9 g/dL — ABNORMAL LOW (ref 12.6–17.7)
Lymphocytes Absolute: 0.9 10*3/uL (ref 0.7–3.1)
Lymphs: 16 %
MCH: 30.8 pg (ref 26.6–33.0)
MCHC: 34 g/dL (ref 31.5–35.7)
MCV: 91 fL (ref 79–97)
Monocytes Absolute: 0.6 10*3/uL (ref 0.1–0.9)
Monocytes: 11 %
NEUTROS ABS: 3.9 10*3/uL (ref 1.4–7.0)
Neutrophils Relative %: 71 %
Platelets: 162 10*3/uL (ref 150–379)
RBC: 3.21 x10E6/uL — ABNORMAL LOW (ref 4.14–5.80)
RDW: 13.7 % (ref 12.3–15.4)
WBC: 5.5 10*3/uL (ref 3.4–10.8)

## 2014-05-12 ENCOUNTER — Telehealth: Payer: Self-pay | Admitting: Cardiology

## 2014-05-12 ENCOUNTER — Other Ambulatory Visit: Payer: Self-pay | Admitting: *Deleted

## 2014-05-12 ENCOUNTER — Telehealth: Payer: Self-pay

## 2014-05-12 MED ORDER — SIMVASTATIN 40 MG PO TABS: 40.0000 mg | ORAL_TABLET | Freq: Every day | ORAL | Status: DC

## 2014-05-12 MED ORDER — AMIODARONE HCL 200 MG PO TABS: 200.0000 mg | ORAL_TABLET | Freq: Every day | ORAL | Status: DC

## 2014-05-12 NOTE — Telephone Encounter (Signed)
Left message with 780.79 for code for CBC.  If additional info is needed they will need to call back.

## 2014-05-12 NOTE — Telephone Encounter (Signed)
Received refill request for Simvastatin 40 mg. Please advised if ok to refill. Med is listed under historical provider.  Thanks!

## 2014-05-12 NOTE — Telephone Encounter (Signed)
Please refill prn 

## 2014-05-12 NOTE — Telephone Encounter (Signed)
[facility] needs a diagnosis code for the CBC w DIff that this patient had drawn 05/01/14.

## 2014-05-12 NOTE — Telephone Encounter (Signed)
Rx sent to pharmacy   

## 2014-05-15 ENCOUNTER — Telehealth: Payer: Self-pay | Admitting: Cardiology

## 2014-05-15 NOTE — Telephone Encounter (Signed)
She says she needs a diagnosis code for his lab that was done on 05-01-14 please.

## 2014-05-15 NOTE — Telephone Encounter (Signed)
Returned call to [place] at [facility] she wanted to know diagnosis code for lab work ordered.427.31 and V43.3 diagnosis codes given.

## 2014-05-17 ENCOUNTER — Inpatient Hospital Stay (HOSPITAL_COMMUNITY)
Admission: EM | Admit: 2014-05-17 | Discharge: 2014-05-22 | DRG: 981 | Disposition: A | Discharge status: Home Health | Payer: Medicare Other | Attending: Internal Medicine | Admitting: Internal Medicine

## 2014-05-17 ENCOUNTER — Inpatient Hospital Stay (HOSPITAL_COMMUNITY): Payer: Medicare Other

## 2014-05-17 ENCOUNTER — Encounter (HOSPITAL_COMMUNITY): Payer: Self-pay | Admitting: Emergency Medicine

## 2014-05-17 DIAGNOSIS — I739 Peripheral vascular disease, unspecified: Secondary | ICD-10-CM | POA: Diagnosis present

## 2014-05-17 DIAGNOSIS — Z9861 Coronary angioplasty status: Secondary | ICD-10-CM

## 2014-05-17 DIAGNOSIS — I959 Hypotension, unspecified: Secondary | ICD-10-CM | POA: Diagnosis present

## 2014-05-17 DIAGNOSIS — Z7901 Long term (current) use of anticoagulants: Secondary | ICD-10-CM

## 2014-05-17 DIAGNOSIS — Z860100 Personal history of colon polyps, unspecified: Secondary | ICD-10-CM

## 2014-05-17 DIAGNOSIS — I509 Heart failure, unspecified: Secondary | ICD-10-CM

## 2014-05-17 DIAGNOSIS — I10 Essential (primary) hypertension: Secondary | ICD-10-CM

## 2014-05-17 DIAGNOSIS — E785 Hyperlipidemia, unspecified: Secondary | ICD-10-CM

## 2014-05-17 DIAGNOSIS — K625 Hemorrhage of anus and rectum: Secondary | ICD-10-CM

## 2014-05-17 DIAGNOSIS — E0590 Thyrotoxicosis, unspecified without thyrotoxic crisis or storm: Secondary | ICD-10-CM

## 2014-05-17 DIAGNOSIS — F411 Generalized anxiety disorder: Secondary | ICD-10-CM | POA: Diagnosis present

## 2014-05-17 DIAGNOSIS — IMO0002 Reserved for concepts with insufficient information to code with codable children: Secondary | ICD-10-CM

## 2014-05-17 DIAGNOSIS — D6489 Other specified anemias: Secondary | ICD-10-CM

## 2014-05-17 DIAGNOSIS — Z953 Presence of xenogenic heart valve: Secondary | ICD-10-CM

## 2014-05-17 DIAGNOSIS — I9589 Other hypotension: Secondary | ICD-10-CM

## 2014-05-17 DIAGNOSIS — Z87891 Personal history of nicotine dependence: Secondary | ICD-10-CM | POA: Diagnosis not present

## 2014-05-17 DIAGNOSIS — Z952 Presence of prosthetic heart valve: Secondary | ICD-10-CM | POA: Diagnosis not present

## 2014-05-17 DIAGNOSIS — K5731 Diverticulosis of large intestine without perforation or abscess with bleeding: Secondary | ICD-10-CM | POA: Diagnosis present

## 2014-05-17 DIAGNOSIS — D509 Iron deficiency anemia, unspecified: Secondary | ICD-10-CM

## 2014-05-17 DIAGNOSIS — Z8673 Personal history of transient ischemic attack (TIA), and cerebral infarction without residual deficits: Secondary | ICD-10-CM

## 2014-05-17 DIAGNOSIS — Z8546 Personal history of malignant neoplasm of prostate: Secondary | ICD-10-CM

## 2014-05-17 DIAGNOSIS — K5791 Diverticulosis of intestine, part unspecified, without perforation or abscess with bleeding: Secondary | ICD-10-CM

## 2014-05-17 DIAGNOSIS — I359 Nonrheumatic aortic valve disorder, unspecified: Secondary | ICD-10-CM

## 2014-05-17 DIAGNOSIS — I2589 Other forms of chronic ischemic heart disease: Secondary | ICD-10-CM | POA: Diagnosis present

## 2014-05-17 DIAGNOSIS — Z950 Presence of cardiac pacemaker: Secondary | ICD-10-CM

## 2014-05-17 DIAGNOSIS — Z951 Presence of aortocoronary bypass graft: Secondary | ICD-10-CM | POA: Diagnosis not present

## 2014-05-17 DIAGNOSIS — I050 Rheumatic mitral stenosis: Secondary | ICD-10-CM

## 2014-05-17 DIAGNOSIS — Z7902 Long term (current) use of antithrombotics/antiplatelets: Secondary | ICD-10-CM | POA: Diagnosis not present

## 2014-05-17 DIAGNOSIS — R911 Solitary pulmonary nodule: Secondary | ICD-10-CM

## 2014-05-17 DIAGNOSIS — I872 Venous insufficiency (chronic) (peripheral): Secondary | ICD-10-CM

## 2014-05-17 DIAGNOSIS — K922 Gastrointestinal hemorrhage, unspecified: Secondary | ICD-10-CM

## 2014-05-17 DIAGNOSIS — K2270 Barrett's esophagus without dysplasia: Secondary | ICD-10-CM | POA: Diagnosis present

## 2014-05-17 DIAGNOSIS — R0600 Dyspnea, unspecified: Secondary | ICD-10-CM

## 2014-05-17 DIAGNOSIS — I2510 Atherosclerotic heart disease of native coronary artery without angina pectoris: Secondary | ICD-10-CM

## 2014-05-17 DIAGNOSIS — Z85828 Personal history of other malignant neoplasm of skin: Secondary | ICD-10-CM | POA: Diagnosis not present

## 2014-05-17 DIAGNOSIS — R42 Dizziness and giddiness: Secondary | ICD-10-CM

## 2014-05-17 DIAGNOSIS — K219 Gastro-esophageal reflux disease without esophagitis: Secondary | ICD-10-CM | POA: Diagnosis present

## 2014-05-17 DIAGNOSIS — Z79899 Other long term (current) drug therapy: Secondary | ICD-10-CM

## 2014-05-17 DIAGNOSIS — Z9889 Other specified postprocedural states: Secondary | ICD-10-CM

## 2014-05-17 DIAGNOSIS — Z0000 Encounter for general adult medical examination without abnormal findings: Secondary | ICD-10-CM

## 2014-05-17 DIAGNOSIS — R571 Hypovolemic shock: Secondary | ICD-10-CM

## 2014-05-17 DIAGNOSIS — Z7982 Long term (current) use of aspirin: Secondary | ICD-10-CM | POA: Diagnosis not present

## 2014-05-17 DIAGNOSIS — D68318 Other hemorrhagic disorder due to intrinsic circulating anticoagulants, antibodies, or inhibitors: Secondary | ICD-10-CM

## 2014-05-17 DIAGNOSIS — T8209XA Other mechanical complication of heart valve prosthesis, initial encounter: Secondary | ICD-10-CM

## 2014-05-17 DIAGNOSIS — I495 Sick sinus syndrome: Secondary | ICD-10-CM

## 2014-05-17 DIAGNOSIS — I4891 Unspecified atrial fibrillation: Secondary | ICD-10-CM | POA: Diagnosis present

## 2014-05-17 DIAGNOSIS — R319 Hematuria, unspecified: Secondary | ICD-10-CM

## 2014-05-17 DIAGNOSIS — Z8669 Personal history of other diseases of the nervous system and sense organs: Secondary | ICD-10-CM

## 2014-05-17 DIAGNOSIS — J984 Other disorders of lung: Secondary | ICD-10-CM

## 2014-05-17 DIAGNOSIS — D696 Thrombocytopenia, unspecified: Secondary | ICD-10-CM | POA: Diagnosis present

## 2014-05-17 DIAGNOSIS — E870 Hyperosmolality and hypernatremia: Secondary | ICD-10-CM | POA: Diagnosis present

## 2014-05-17 DIAGNOSIS — Z8679 Personal history of other diseases of the circulatory system: Secondary | ICD-10-CM

## 2014-05-17 DIAGNOSIS — R578 Other shock: Secondary | ICD-10-CM | POA: Diagnosis present

## 2014-05-17 DIAGNOSIS — C4491 Basal cell carcinoma of skin, unspecified: Secondary | ICD-10-CM

## 2014-05-17 DIAGNOSIS — I255 Ischemic cardiomyopathy: Secondary | ICD-10-CM

## 2014-05-17 DIAGNOSIS — I5041 Acute combined systolic (congestive) and diastolic (congestive) heart failure: Secondary | ICD-10-CM

## 2014-05-17 DIAGNOSIS — D649 Anemia, unspecified: Secondary | ICD-10-CM | POA: Diagnosis present

## 2014-05-17 DIAGNOSIS — Z8719 Personal history of other diseases of the digestive system: Secondary | ICD-10-CM | POA: Diagnosis present

## 2014-05-17 DIAGNOSIS — I480 Paroxysmal atrial fibrillation: Secondary | ICD-10-CM

## 2014-05-17 DIAGNOSIS — J90 Pleural effusion, not elsewhere classified: Secondary | ICD-10-CM

## 2014-05-17 DIAGNOSIS — I350 Nonrheumatic aortic (valve) stenosis: Secondary | ICD-10-CM

## 2014-05-17 DIAGNOSIS — I482 Chronic atrial fibrillation, unspecified: Secondary | ICD-10-CM

## 2014-05-17 LAB — CBC
HCT: 27.6 % — ABNORMAL LOW (ref 39.0–52.0)
HEMOGLOBIN: 9 g/dL — AB (ref 13.0–17.0)
MCH: 31.3 pg (ref 26.0–34.0)
MCHC: 32.6 g/dL (ref 30.0–36.0)
MCV: 95.8 fL (ref 78.0–100.0)
Platelets: 145 10*3/uL — ABNORMAL LOW (ref 150–400)
RBC: 2.88 MIL/uL — ABNORMAL LOW (ref 4.22–5.81)
RDW: 14.9 % (ref 11.5–15.5)
WBC: 5.2 10*3/uL (ref 4.0–10.5)

## 2014-05-17 LAB — COMPREHENSIVE METABOLIC PANEL
ALBUMIN: 3.1 g/dL — AB (ref 3.5–5.2)
ALK PHOS: 81 U/L (ref 39–117)
ALT: 8 U/L (ref 0–53)
AST: 14 U/L (ref 0–37)
Anion gap: 12 (ref 5–15)
BILIRUBIN TOTAL: 0.6 mg/dL (ref 0.3–1.2)
BUN: 29 mg/dL — AB (ref 6–23)
CHLORIDE: 111 meq/L (ref 96–112)
CO2: 25 mEq/L (ref 19–32)
Calcium: 8.4 mg/dL (ref 8.4–10.5)
Creatinine, Ser: 0.98 mg/dL (ref 0.50–1.35)
GFR calc Af Amer: 86 mL/min — ABNORMAL LOW (ref >90)
GFR calc non Af Amer: 74 mL/min — ABNORMAL LOW (ref >90)
GLUCOSE: 105 mg/dL — AB (ref 70–99)
POTASSIUM: 4.2 meq/L (ref 3.7–5.3)
SODIUM: 148 meq/L — AB (ref 137–147)
Total Protein: 5.6 g/dL — ABNORMAL LOW (ref 6.0–8.3)

## 2014-05-17 LAB — IRON AND TIBC
IRON: 191 ug/dL — AB (ref 42–135)
Saturation Ratios: 88 % — ABNORMAL HIGH (ref 20–55)
TIBC: 217 ug/dL (ref 215–435)
UIBC: 26 ug/dL — ABNORMAL LOW (ref 125–400)

## 2014-05-17 LAB — RETICULOCYTES
RBC.: 2.51 MIL/uL — ABNORMAL LOW (ref 4.22–5.81)
RETIC COUNT ABSOLUTE: 55.2 10*3/uL (ref 19.0–186.0)
Retic Ct Pct: 2.2 % (ref 0.4–3.1)

## 2014-05-17 LAB — PREPARE RBC (CROSSMATCH)

## 2014-05-17 LAB — FOLATE: Folate: 18.4 ng/mL

## 2014-05-17 LAB — HEMOGLOBIN AND HEMATOCRIT, BLOOD
HEMATOCRIT: 24.4 % — AB (ref 39.0–52.0)
HEMATOCRIT: 26.3 % — AB (ref 39.0–52.0)
HEMOGLOBIN: 8.8 g/dL — AB (ref 13.0–17.0)
Hemoglobin: 7.9 g/dL — ABNORMAL LOW (ref 13.0–17.0)

## 2014-05-17 LAB — TROPONIN I

## 2014-05-17 LAB — PROTIME-INR
INR: 1.3 (ref 0.00–1.49)
Prothrombin Time: 16.2 seconds — ABNORMAL HIGH (ref 11.6–15.2)

## 2014-05-17 LAB — MRSA PCR SCREENING: MRSA BY PCR: NEGATIVE

## 2014-05-17 LAB — FERRITIN: FERRITIN: 62 ng/mL (ref 22–322)

## 2014-05-17 LAB — VITAMIN B12: Vitamin B-12: 436 pg/mL (ref 211–911)

## 2014-05-17 MED ORDER — SODIUM CHLORIDE 0.9 % IV BOLUS (SEPSIS)
1000.0000 mL | Freq: Once | INTRAVENOUS | Status: AC
  Administered 2014-05-17: 1000 mL via INTRAVENOUS

## 2014-05-17 MED ORDER — IOHEXOL 300 MG/ML  SOLN
150.0000 mL | Freq: Once | INTRAMUSCULAR | Status: AC | PRN
  Administered 2014-05-17: 100 mL via INTRAVENOUS

## 2014-05-17 MED ORDER — MIDAZOLAM HCL 2 MG/2ML IJ SOLN
INTRAMUSCULAR | Status: AC
  Filled 2014-05-17: qty 4

## 2014-05-17 MED ORDER — SODIUM CHLORIDE 0.9 % IV BOLUS (SEPSIS)
500.0000 mL | Freq: Once | INTRAVENOUS | Status: AC
  Administered 2014-05-18: 500 mL via INTRAVENOUS

## 2014-05-17 MED ORDER — NITROGLYCERIN 1 MG/10 ML FOR IR/CATH LAB
INTRA_ARTERIAL | Status: AC
  Filled 2014-05-17: qty 10

## 2014-05-17 MED ORDER — FENTANYL CITRATE 0.05 MG/ML IJ SOLN
INTRAMUSCULAR | Status: DC | PRN
  Administered 2014-05-17 (×4): 12.5 ug via INTRAVENOUS
  Administered 2014-05-17: 50 ug via INTRAVENOUS

## 2014-05-17 MED ORDER — PANTOPRAZOLE SODIUM 40 MG IV SOLR
40.0000 mg | Freq: Two times a day (BID) | INTRAVENOUS | Status: DC
  Administered 2014-05-17 – 2014-05-19 (×4): 40 mg via INTRAVENOUS
  Filled 2014-05-17 (×5): qty 40

## 2014-05-17 MED ORDER — SODIUM CHLORIDE 0.9 % IV SOLN
INTRAVENOUS | Status: DC
  Administered 2014-05-17: 16:00:00 via INTRAVENOUS

## 2014-05-17 MED ORDER — SODIUM CHLORIDE 0.9 % IV SOLN: INTRAVENOUS | Status: DC

## 2014-05-17 MED ORDER — PANTOPRAZOLE SODIUM 40 MG IV SOLR
40.0000 mg | Freq: Once | INTRAVENOUS | Status: AC
  Administered 2014-05-17: 40 mg via INTRAVENOUS
  Filled 2014-05-17: qty 40

## 2014-05-17 MED ORDER — AMIODARONE HCL 200 MG PO TABS
200.0000 mg | ORAL_TABLET | Freq: Every day | ORAL | Status: DC
  Administered 2014-05-18 – 2014-05-22 (×5): 200 mg via ORAL
  Filled 2014-05-17 (×5): qty 1

## 2014-05-17 MED ORDER — FENTANYL CITRATE 0.05 MG/ML IJ SOLN
INTRAMUSCULAR | Status: AC
  Filled 2014-05-17: qty 4

## 2014-05-17 MED ORDER — ONDANSETRON HCL 4 MG PO TABS: 4.0000 mg | ORAL_TABLET | Freq: Four times a day (QID) | ORAL | Status: DC | PRN

## 2014-05-17 MED ORDER — TAMSULOSIN HCL 0.4 MG PO CAPS
0.4000 mg | ORAL_CAPSULE | Freq: Every evening | ORAL | Status: DC
  Administered 2014-05-18 – 2014-05-22 (×5): 0.4 mg via ORAL
  Filled 2014-05-17 (×5): qty 1

## 2014-05-17 MED ORDER — MIDAZOLAM HCL 2 MG/2ML IJ SOLN
INTRAMUSCULAR | Status: DC | PRN
  Administered 2014-05-17 (×2): 0.5 mg via INTRAVENOUS
  Administered 2014-05-17 (×2): 1 mg via INTRAVENOUS

## 2014-05-17 MED ORDER — METHIMAZOLE 5 MG PO TABS
5.0000 mg | ORAL_TABLET | ORAL | Status: DC
  Administered 2014-05-19 – 2014-05-21 (×2): 5 mg via ORAL
  Filled 2014-05-17 (×2): qty 1

## 2014-05-17 MED ORDER — TECHNETIUM TC 99M-LABELED RED BLOOD CELLS IV KIT
25.0000 | PACK | Freq: Once | INTRAVENOUS | Status: AC | PRN
  Administered 2014-05-17: 25 via INTRAVENOUS

## 2014-05-17 MED ORDER — FERROUS SULFATE 325 (65 FE) MG PO TABS
325.0000 mg | ORAL_TABLET | Freq: Every day | ORAL | Status: DC
  Administered 2014-05-18 – 2014-05-22 (×5): 325 mg via ORAL
  Filled 2014-05-17 (×7): qty 1

## 2014-05-17 MED ORDER — ONDANSETRON HCL 4 MG/2ML IJ SOLN: 4.0000 mg | Freq: Four times a day (QID) | INTRAMUSCULAR | Status: DC | PRN

## 2014-05-17 NOTE — Procedures (Signed)
Post mesenteric arteriogram with technically successful coil embolization of an actively bleeding arcade vessel within the sigmoid colon.  No immediate complications.  Keep left leg straight for 4 hrs.

## 2014-05-17 NOTE — ED Provider Notes (Signed)
CSN: [id number]     Arrival date & time 05/17/14  1039 History   First MD Initiated Contact with Patient 05/17/14 1049     Chief Complaint  Patient presents with  . Rectal Bleeding     (Consider location/radiation/quality/duration/timing/severity/associated sxs/prior Treatment) HPI  This is an 80 year old male with a history of colonic polyps, hypertension, atrial fibrillation, anemia, recent aortic valve repair in June and currently on Plavix who presents with acute lower GI bleed. Patient reports onset of bright red blood per rectum this morning at approximately 3 AM. He reports that it is mostly blood. He has had multiple episodes of bright red blood per rectum with minimal stool since that time. He reports that he gets dizzy and off balance upon standing. He denies any syncope. He denies any abdominal pain or vomiting. He has never had anything like this in the past.  Primary GI doctor is Dr. [facility].  Past Medical History  Diagnosis Date  . GASTRIC POLYP 05/13/2009  . COLONIC POLYPS, ADENOMATOUS 01/14/2008  . HYPERLIPIDEMIA 06/01/2007  . ANEMIA, IRON DEFICIENCY 10/13/2008  . COAGULOPATHY, COUMADIN-INDUCED 01/14/2008  . ANXIETY 09/04/2008  . HYPERTENSION 06/01/2007  . ATRIAL FIBRILLATION, CHRONIC 01/14/2008    on warfarin since 2007  . UNSPECIFIED VENOUS INSUFFICIENCY 09/18/2008  . PLEURAL EFFUSION, LEFT 10/13/2008  . GERD 06/01/2007  . BARRETTS ESOPHAGUS 03/20/2009  . PROSTATE CANCER, HX OF 01/14/2008  . CATARACTS, BILATERAL, HX OF 01/14/2008  . CEREBROVASCULAR ACCIDENT, HX OF 08/18/2008  . PERSONAL HX COLONIC POLYPS 01/01/2010  . BASAL CELL CARCINOMA OF SKIN SITE UNSPECIFIED 09/28/2010  . Hyperglycemia   . Elevated PSA   . Restless leg syndrome   . Mitral regurgitation 06/13/2008    mitral valve replacement #20 St. Jude Biocor; Maze procedure by Dr. Amador Cunas  at [facility]  . CORONARY ARTERY DISEASE 06/01/2007    Cath 04/03/2008; Cath 11/01/1990  . Tachy-brady syndrome 07/13/2008     medtronic Adapta gen. model ADDR01 ,serial # [id number] H by Dr  [name] at [facility]  . Peripheral artery disease 06/16/2010    LEA doppler-left ABI nml 0.61 calcified vessels;right ABI  0.68  . Edema, peripheral 07/30/2009    LEV doppler- no thrombus or thrombophlebitis  . Aortic valvular disorder 08/16/2012    echo EF 45-50% LV mildly reduce,Biocor mitral valve,mild to mod. tricuspid regrug.,mild to mod. aortic regrug.  . Mitral valve disorder 09/08/2011    echo EF 50-55% LV normal  . Aortic valvular stenosis 11/19/2013  . Cardiomyopathy, ischemic 11/19/2013    EF 45-50% with inferior wall hypokinesis by echo 2014  . S/P CABG x 1 06/13/2008    SVG to LAD with open vein harvest right thigh, LIMA harvested but not utilized by Dr Amador Cunas  . S/P mitral valve replacement with bioprosthetic valve 06/13/2008    79mm St Jude Biocor porcine bioprosthetic tissue valve by Dr Amador Cunas  . S/P Maze operation for atrial fibrillation 06/13/2008    Partial left atrial lesion set using cryothermy for bilateral pulmonary vein isolation by Dr Amador Cunas  . Prosthetic valve dysfunction     Mitral stenosis involving bioprosthetic tissue valve placed 06/13/2008  . Mitral stenosis     Porcine bioprosthetic tissue valve placed 06/13/2008  . Dysrhythmia     HX OF TACHY BRADY  . Pacemaker   . HYPERTHYROIDISM 01/06/2011  . S/P TAVR (transcatheter aortic valve replacement) 04/22/2014    29 mm Edwards Sapien XT transcatheter heart valve placed via open right transfemoral approach  Past Surgical History  Procedure Laterality Date  . Prostatectomy    . Tonsillectomy    . Cystectomy      Left leg  . Pacemaker placement  07/13/2008    Adapata dual chamber-Medtronic at [employer]   . Coronary artery bypass graft  06/13/2008    graft x1 w/vein graft LAD artery by Dr. Amador [facility] at [facility]  . Mitral valve replacement  06/13/2008    #20 St. Jude Bicor mitral valve ;Maze procedure at [facility] by Dr. Amador Cunas  . Cardiac catheterization   11/01/1990  . Skin cancer excision      removed from forehead and right leg, nose/face  . Upper gastrointestinal endoscopy  07/14/2009    erosive reflux esopagitis, Barrett's esophagus  . Colonoscopy  07/14/2009    diverticulosis, internal hemorrhoids  . Cardiovascular stress test  06/03/2010    Persantine perfusion EF 45% mild to moderate ischemia basal and mid inferolateral region  . Cardiovascular stress test  01/01/2008    left ventricle normal,no significant ischemia  . Cardiovascular stress test  09/09/2005     possibility of ischemia inferior wall  . Tee without cardioversion N/A 01/22/2014    Procedure: TRANSESOPHAGEAL ECHOCARDIOGRAM (TEE);  Surgeon: Sanda Klein, MD;  Location[facility] ENDOSCOPY;  Service: Cardiovascular;  Laterality: N/A;  . Coronary stent placement  03/24/2014    DES to LAD  . Transcatheter aortic valve replacement, transfemoral N/A 04/22/2014    Procedure: TRANSCATHETER AORTIC VALVE REPLACEMENT, TRANSFEMORAL;  Surgeon: [name], MD;  Location: [employer];  Service: Open Heart Surgery;  Laterality: N/A;  TAVR-TF (RIGHT SIDE)  . Intraoperative transesophageal echocardiogram N/A 04/22/2014    Procedure: INTRAOPERATIVE TRANSTHORACIC ECHOCARDIOGRAM;  Surgeon: [name], MD;  Location: [place];  Service: Open Heart Surgery;  Laterality: N/A;   Family History  Problem Relation Age of Onset  . Cancer Mother     Breast Cancer   History  Substance Use Topics  . Smoking status: Former Smoker -- 2 years    Types: Pipe    Quit date: 11/07/1965  . Smokeless tobacco: Never Used     Comment: quit over 40 years ago, never smoked cigarettes  . Alcohol Use: No    Review of Systems  Constitutional: Negative.  Negative for fever.  Respiratory: Negative.  Negative for chest tightness and shortness of breath.   Cardiovascular: Negative.  Negative for chest pain.  Gastrointestinal: Positive for blood in stool. Negative for nausea, vomiting, abdominal pain and constipation.   Genitourinary: Negative.  Negative for dysuria.  Musculoskeletal: Negative for back pain.  Neurological: Positive for dizziness and light-headedness. Negative for syncope and headaches.  All other systems reviewed and are negative.     Allergies  Review of patient's allergies indicates no known allergies.  Home Medications   Prior to Admission medications   Medication Sig Start Date End Date Taking? Authorizing Provider  amiodarone (PACERONE) 200 MG tablet Take 1 tablet (200 mg total) by mouth daily. 05/12/14  Yes [name] [name], MD  aspirin EC 81 MG tablet Take 81 mg by mouth daily.   Yes Historical Provider, MD  clopidogrel (PLAVIX) 75 MG tablet Take 75 mg by mouth daily with breakfast.   Yes Historical Provider, MD  ferrous sulfate 325 (65 FE) MG tablet Take 325 mg by mouth daily with breakfast.   Yes Historical Provider, MD  methimazole (TAPAZOLE) 5 MG tablet Take 5 mg by mouth every Monday, Wednesday, and Friday.  02/25/14  Yes [name], MD  metoprolol succinate (TOPROL-XL) 50  MG 24 hr tablet Take 1 tablet (50 mg total) by mouth daily. Take with or immediately following a meal. 04/24/14  Yes Brittainy Simmons, PA-C  Multiple Vitamin (MULTIVITAMIN WITH MINERALS) TABS Take 1 tablet by mouth daily. Centrum Silver   Yes Historical Provider, MD  pantoprazole (PROTONIX) 40 MG tablet Take 40 mg by mouth 2 (two) times daily.   Yes Historical Provider, MD  simvastatin (ZOCOR) 40 MG tablet Take 1 tablet (40 mg total) by mouth at bedtime. 05/12/14  Yes [name], MD  Tamsulosin HCl (FLOMAX) 0.4 MG CAPS Take 0.4 mg by mouth every evening.    Yes Historical Provider, MD   BP 100/59  Pulse 70  Temp(Src) 97.6 F (36.4 C) (Oral)  Resp 17  Ht 5\' 10"  (1.778 m)  Wt 170 lb (77.111 kg)  BMI 24.39 kg/m2  SpO2 96% Physical Exam  Nursing note and vitals reviewed. Constitutional: He is oriented to person, place, and time. No distress.  Elderly, no acute distress  HENT:  Head: Normocephalic and  atraumatic.  Eyes: Pupils are equal, round, and reactive to light.  Neck: Neck supple.  Cardiovascular: Normal rate.   Murmur heard. Irregular rhythm  Pulmonary/Chest: Effort normal and breath sounds normal. No respiratory distress. He has no wheezes.  Abdominal: Soft. Bowel sounds are normal. There is no tenderness. There is no rebound and no guarding.  Genitourinary:  Gross blood per rectum  Musculoskeletal: He exhibits no edema.  Neurological: He is alert and oriented to person, place, and time.  Skin: Skin is warm and dry.  Psychiatric: He has a normal mood and affect.    ED Course  Procedures (including critical care time)  CRITICAL CARE Performed by: Thayer Jew, F   Total critical care time: 35 min Critical care time was exclusive of separately billable procedures and treating other patients.  Critical care was necessary to treat or prevent imminent or life-threatening deterioration.  Critical care was time spent personally by me on the following activities: development of treatment plan with patient and/or surrogate as well as nursing, discussions with consultants, evaluation of patient's response to treatment, examination of patient, obtaining history from patient or surrogate, ordering and performing treatments and interventions, ordering and review of laboratory studies, ordering and review of radiographic studies, pulse oximetry and re-evaluation of patient's condition.  Labs Review Labs Reviewed  CBC - Abnormal; Notable for the following:    RBC 2.88 (*)    Hemoglobin 9.0 (*)    HCT 27.6 (*)    Platelets 145 (*)    All other components within normal limits  COMPREHENSIVE METABOLIC PANEL - Abnormal; Notable for the following:    Sodium 148 (*)    Glucose, Bld 105 (*)    BUN 29 (*)    Total Protein 5.6 (*)    Albumin 3.1 (*)    GFR calc non Af Amer 74 (*)    GFR calc Af Amer 86 (*)    All other components within normal limits  PROTIME-INR - Abnormal;  Notable for the following:    Prothrombin Time 16.2 (*)    All other components within normal limits  TROPONIN I  TYPE AND SCREEN    Imaging Review No results found.   EKG Interpretation   Date/Time:  Saturday May 17 2014 11:07:12 EDT Ventricular Rate:  76 PR Interval:    QRS Duration: 159 QT Interval:  522 QTC Calculation: 587 R Axis:   -68 Text Interpretation:  Age not entered, assumed to  be  80 years old for  purpose of ECG interpretation Atrial fibrillation Left bundle branch block  Inferior infarct, acute (RCA) Probable RV involvement, suggest recording  right precordial leads T wave inversions inferiorly and laterally Repeat  requested Confirmed by Keyairra Kolinski  MD, Darcus Edds ([id number]) on 05/17/2014 11:14:05  AM      MDM   Final diagnoses:  Acute lower GI bleeding  Hypovolemic shock   Patient presents with acute GI bleed. Most likely lower given gross blood per time. Initial vital signs notable for a blood pressure of 93 systolic. Patient reports symptoms of orthostasis. Repeat blood pressures with systolics in the 61H. 2 large-bore IVs were established and patient was given a normal saline bolus x 2. He was fluid responsive with repeat blood pressure approximately 45 minutes after arrival of 104/57.  Hemoglobin down to 9.0 from baseline of 9.9. Does not likely represent all of acute bleeding. Patient looks volume contracted with hypernatremia. EKG shows atrial fibrillation with intraventricular conduction delay. Patient is currently without any chest pain.  Discussed with Dr. Collene Mares with GI. Will admit patient to step down unit under the hospitalist care.    Merryl Hacker, MD 05/17/14 1247

## 2014-05-17 NOTE — ED Notes (Signed)
Pt reports rectal bleeding, onset this am, sts diffuse red blood, per wife she gave him a poise to absorb the blood.

## 2014-05-17 NOTE — ED Notes (Signed)
Hospitalist at bedside 

## 2014-05-17 NOTE — ED Notes (Signed)
Dr. Horton at bedside. 

## 2014-05-17 NOTE — Consult Note (Signed)
Cross cover [facility]  Reason for Consult: Rectal bleeding. Referring Physician: [facility]-Dr. Karleen Hampshire,  [name] [name] is an 80 y.o. male.  HPI: 80 year old, white male, with multiple medical issues listed below, presented to the ER with a history of brisk rectal bleeding since 3 am this morning. Inspite of the bleeding, he took his morning medications including his Plavix and Aspirin. He denies having any abdominal pain, nausea or vomiting. He is s/p TAVR [transcatheter aortic valve replacement on 04/22/2014. His last colonoscopy was done in 2008 when he was found to have sigmoid and transverse colon diverticulosis and he had an adenomatous colonic polyp removed in 2002. He also has Barrett's esophagus and his last EGD in Epic was done in 2011, when was noted to have short segment Barrett's and a 10 cm hiatal herna. He has also had gastric polyps removed in the past.    Past Medical History  Diagnosis Date  . GASTRIC POLYP 05/13/2009  . COLONIC POLYPS, ADENOMATOUS 01/14/2008  . HYPERLIPIDEMIA 06/01/2007  . ANEMIA, IRON DEFICIENCY 10/13/2008  . COAGULOPATHY, COUMADIN-INDUCED 01/14/2008  . ANXIETY 09/04/2008  . HYPERTENSION 06/01/2007  . ATRIAL FIBRILLATION, CHRONIC 01/14/2008    on warfarin since 2007  . UNSPECIFIED VENOUS INSUFFICIENCY 09/18/2008  . PLEURAL EFFUSION, LEFT 10/13/2008  . GERD 06/01/2007  . BARRETTS ESOPHAGUS 03/20/2009  . PROSTATE CANCER, HX OF 01/14/2008  . CATARACTS, BILATERAL, HX OF 01/14/2008  . CEREBROVASCULAR ACCIDENT, HX OF 08/18/2008  . PERSONAL HX COLONIC POLYPS 01/01/2010  . BASAL CELL CARCINOMA OF SKIN SITE UNSPECIFIED 09/28/2010  . Hyperglycemia   . Elevated PSA   . Restless leg syndrome   . Mitral regurgitation 06/13/2008    mitral valve replacement #20 St. Jude Biocor; Maze procedure by Dr. Amador Cunas  at [facility]  . CORONARY ARTERY DISEASE 06/01/2007    Cath 04/03/2008; Cath 11/01/1990  . Tachy-brady syndrome 07/13/2008    medtronic Adapta gen. model ADDR01 ,serial # [id number] H by Dr   [name] at [facility]  . Peripheral artery disease 06/16/2010    LEA doppler-left ABI nml 0.61 calcified vessels;right ABI  0.68  . Edema, peripheral 07/30/2009    LEV doppler- no thrombus or thrombophlebitis  . Aortic valvular disorder 08/16/2012    echo EF 45-50% LV mildly reduce,Biocor mitral valve,mild to mod. tricuspid regrug.,mild to mod. aortic regrug.  . Mitral valve disorder 09/08/2011    echo EF 50-55% LV normal  . Aortic valvular stenosis 11/19/2013  . Cardiomyopathy, ischemic 11/19/2013    EF 45-50% with inferior wall hypokinesis by echo 2014  . S/P CABG x 1 06/13/2008    SVG to LAD with open vein harvest right thigh, LIMA harvested but not utilized by Dr Amador Cunas  . S/P mitral valve replacement with bioprosthetic valve 06/13/2008    23m St Jude Biocor porcine bioprosthetic tissue valve by Dr CAmador Cunas . S/P Maze operation for atrial fibrillation 06/13/2008    Partial left atrial lesion set using cryothermy for bilateral pulmonary vein isolation by Dr CAmador Cunas . Prosthetic valve dysfunction     Mitral stenosis involving bioprosthetic tissue valve placed 06/13/2008  . Mitral stenosis     Porcine bioprosthetic tissue valve placed 06/13/2008  . Dysrhythmia     HX OF TACHY BRADY  . Pacemaker   . HYPERTHYROIDISM 01/06/2011  . S/P TAVR (transcatheter aortic valve replacement) 04/22/2014    29 mm Edwards Sapien XT transcatheter heart valve placed via open right transfemoral approach   Past Surgical History  Procedure Laterality  Date  . Prostatectomy    . Tonsillectomy    . Cystectomy      Left leg  . Pacemaker placement  07/13/2008    Adapata dual chamber-Medtronic at [employer]   . Coronary artery bypass graft  06/13/2008    graft x1 w/vein graft LAD artery by Dr. Amador Cunas at [facility]  . Mitral valve replacement  06/13/2008    #20 St. Jude Bicor mitral valve ;Maze procedure at [facility] by Dr. Amador Cunas  . Cardiac catheterization  11/01/1990  . Skin cancer excision      removed from forehead and  right leg, nose/face  . Upper gastrointestinal endoscopy  07/14/2009    erosive reflux esopagitis, Barrett's esophagus  . Colonoscopy  07/14/2009    diverticulosis, internal hemorrhoids  . Cardiovascular stress test  06/03/2010    Persantine perfusion EF 45% mild to moderate ischemia basal and mid inferolateral region  . Cardiovascular stress test  01/01/2008    left ventricle normal,no significant ischemia  . Cardiovascular stress test  09/09/2005     possibility of ischemia inferior wall  . Tee without cardioversion N/A 01/22/2014    Procedure: TRANSESOPHAGEAL ECHOCARDIOGRAM (TEE);  Surgeon: Sanda Klein, MD;  Location: [facility] ENDOSCOPY;  Service: Cardiovascular;  Laterality: N/A;  . Coronary stent placement  03/24/2014    DES to LAD  . Transcatheter aortic valve replacement, transfemoral N/A 04/22/2014    Procedure: TRANSCATHETER AORTIC VALVE REPLACEMENT, TRANSFEMORAL;  Surgeon: [name], MD;  Location: [place];  Service: Open Heart Surgery;  Laterality: N/A;  TAVR-TF (RIGHT SIDE)  . Intraoperative transesophageal echocardiogram N/A 04/22/2014    Procedure: INTRAOPERATIVE TRANSTHORACIC ECHOCARDIOGRAM;  Surgeon: [name], MD;  Location: [place];  Service: Open Heart Surgery;  Laterality: N/A;   Family History  Problem Relation Age of Onset  . Cancer Mother     Breast Cancer   Social History:  reports that he quit smoking about 48 years ago. His smoking use included Pipe. He has never used smokeless tobacco. He reports that he does not drink alcohol or use illicit drugs.  Allergies: No Known Allergies  Medications: I have reviewed the patient's current medications.  Results for orders placed during the hospital encounter of 05/17/14 (from the past 48 hour(s))  TYPE AND SCREEN     Status: None   Collection Time    05/17/14 10:53 AM      Result Value Ref Range   ABO/RH(D) B NEG     Antibody Screen NEG     Sample Expiration 05/20/2014     Unit Number [id number]     Blood  Component Type RED CELLS,LR     Unit division 00     Status of Unit ISSUED     Transfusion Status OK TO TRANSFUSE     Crossmatch Result Compatible    CBC     Status: Abnormal   Collection Time    05/17/14 10:55 AM      Result Value Ref Range   WBC 5.2  4.0 - 10.5 K/uL   RBC 2.88 (*) 4.22 - 5.81 MIL/uL   Hemoglobin 9.0 (*) 13.0 - 17.0 g/dL   HCT 27.6 (*) 39.0 - 52.0 %   MCV 95.8  78.0 - 100.0 fL   MCH 31.3  26.0 - 34.0 pg   MCHC 32.6  30.0 - 36.0 g/dL   RDW 14.9  11.5 - 15.5 %   Platelets 145 (*) 150 - 400 K/uL  COMPREHENSIVE METABOLIC PANEL     Status: Abnormal  Collection Time    05/17/14 10:55 AM      Result Value Ref Range   Sodium 148 (*) 137 - 147 mEq/L   Potassium 4.2  3.7 - 5.3 mEq/L   Chloride 111  96 - 112 mEq/L   CO2 25  19 - 32 mEq/L   Glucose, Bld 105 (*) 70 - 99 mg/dL   BUN 29 (*) 6 - 23 mg/dL   Creatinine, Ser 0.98  0.50 - 1.35 mg/dL   Calcium 8.4  8.4 - 10.5 mg/dL   Total Protein 5.6 (*) 6.0 - 8.3 g/dL   Albumin 3.1 (*) 3.5 - 5.2 g/dL   AST 14  0 - 37 U/L   ALT 8  0 - 53 U/L   Alkaline Phosphatase 81  39 - 117 U/L   Total Bilirubin 0.6  0.3 - 1.2 mg/dL   GFR calc non Af Amer 74 (*) >90 mL/min   GFR calc Af Amer 86 (*) >90 mL/min   Comment: (NOTE)     The eGFR has been calculated using the CKD EPI equation.     This calculation has not been validated in all clinical situations.     eGFR's persistently <90 mL/min signify possible Chronic Kidney     Disease.   Anion gap 12  5 - 15  PROTIME-INR     Status: Abnormal   Collection Time    05/17/14 10:55 AM      Result Value Ref Range   Prothrombin Time 16.2 (*) 11.6 - 15.2 seconds   INR 1.30  0.00 - 1.49  TROPONIN I     Status: None   Collection Time    05/17/14 11:22 AM      Result Value Ref Range   Troponin I <0.30  <0.30 ng/mL   Comment:            Due to the release kinetics of cTnI,     a negative result within the first hours     of the onset of symptoms does not rule out     myocardial  infarction with certainty.     If myocardial infarction is still suspected,     repeat the test at appropriate intervals.  PREPARE RBC (CROSSMATCH)     Status: None   Collection Time    05/17/14  2:05 PM      Result Value Ref Range   Order Confirmation ORDER PROCESSED BY BLOOD BANK    HEMOGLOBIN AND HEMATOCRIT, BLOOD     Status: Abnormal   Collection Time    05/17/14  2:15 PM      Result Value Ref Range   Hemoglobin 7.9 (*) 13.0 - 17.0 g/dL   HCT 24.4 (*) 39.0 - 52.0 %  RETICULOCYTES     Status: Abnormal   Collection Time    05/17/14  2:15 PM      Result Value Ref Range   Retic Ct Pct 2.2  0.4 - 3.1 %   RBC. 2.51 (*) 4.22 - 5.81 MIL/uL   Retic Count, Manual 55.2  19.0 - 186.0 K/uL   No results found.  Review of Systems  Constitutional: Positive for malaise/fatigue. Negative for weight loss and diaphoresis.  HENT: Negative.   Respiratory: Negative.   Gastrointestinal: Positive for blood in stool. Negative for heartburn, nausea, vomiting, abdominal pain, diarrhea, constipation and melena.  Neurological: Positive for weakness.  Psychiatric/Behavioral: The patient is nervous/anxious.    Blood pressure 110/78, pulse 68, temperature 98 F (36.7  C), temperature source Oral, resp. rate 20, height 5' 10"  (1.778 m), weight 82.9 kg (182 lb 12.2 oz), SpO2 94.00%. Physical Exam  Constitutional: He is oriented to person, place, and time. He appears well-developed and well-nourished.  HENT:  Head: Normocephalic and atraumatic.  Eyes: Conjunctivae and EOM are normal. Pupils are equal, round, and reactive to light.  Neck: Normal range of motion. Neck supple.  Cardiovascular:  Pacemaker on the left chest-paced rhythm  Respiratory: Effort normal and breath sounds normal.  GI: Soft. Bowel sounds are normal.  Musculoskeletal: Normal range of motion.  Neurological: He is alert and oriented to person, place, and time.  Skin: Skin is warm and dry.  Crusted rash on both lower limbs anteriorly   Psychiatric: He has a normal mood and affect. His behavior is normal. Judgment and thought content normal.   Assessment/Plan: 1) Rectal bleeding with anemia and hypotension; ?diverticular bleed; Will do a tagged RBC bleeding scan first as discussed with Dr. Pascal Lux in IR. I have spoken to him personally about the patient several times and he is aware of the urgency in this situation.  2) History of colonic diverticulosis and polyps. 3) GERD/Barett's esophagus. 4) CAD/Hyperlipidemia/Hyperlipdemia.  5) PAF.   Keldric Poyer 05/17/2014, 4:52 PM

## 2014-05-17 NOTE — Consult Note (Addendum)
CARDIOLOGY CONSULT NOTE   Patient ID: [name] [name] MRN: [id number], DOB/AGE: Jul 17, 1929   Admit date: 05/17/2014 Date of Consult: 05/17/2014   Primary Physician: [name], MD Primary Cardiologist: Dr. [name]  Problem List  Past Medical History  Diagnosis Date  . GASTRIC POLYP 05/13/2009  . COLONIC POLYPS, ADENOMATOUS 01/14/2008  . HYPERLIPIDEMIA 06/01/2007  . ANEMIA, IRON DEFICIENCY 10/13/2008  . COAGULOPATHY, COUMADIN-INDUCED 01/14/2008  . ANXIETY 09/04/2008  . HYPERTENSION 06/01/2007  . ATRIAL FIBRILLATION, CHRONIC 01/14/2008    on warfarin since 2007  . UNSPECIFIED VENOUS INSUFFICIENCY 09/18/2008  . PLEURAL EFFUSION, LEFT 10/13/2008  . GERD 06/01/2007  . BARRETTS ESOPHAGUS 03/20/2009  . PROSTATE CANCER, HX OF 01/14/2008  . CATARACTS, BILATERAL, HX OF 01/14/2008  . CEREBROVASCULAR ACCIDENT, HX OF 08/18/2008  . PERSONAL HX COLONIC POLYPS 01/01/2010  . BASAL CELL CARCINOMA OF SKIN SITE UNSPECIFIED 09/28/2010  . Hyperglycemia   . Elevated PSA   . Restless leg syndrome   . Mitral regurgitation 06/13/2008    mitral valve replacement #20 St. Jude Biocor; Maze procedure by Dr. Amador Cunas  at [facility]  . CORONARY ARTERY DISEASE 06/01/2007    Cath 04/03/2008; Cath 11/01/1990  . Tachy-brady syndrome 07/13/2008    medtronic Adapta gen. model ADDR01 ,serial # [id number] H by Dr  [name] at [facility]  . Peripheral artery disease 06/16/2010    LEA doppler-left ABI nml 0.61 calcified vessels;right ABI  0.68  . Edema, peripheral 07/30/2009    LEV doppler- no thrombus or thrombophlebitis  . Aortic valvular disorder 08/16/2012    echo EF 45-50% LV mildly reduce,Biocor mitral valve,mild to mod. tricuspid regrug.,mild to mod. aortic regrug.  . Mitral valve disorder 09/08/2011    echo EF 50-55% LV normal  . Aortic valvular stenosis 11/19/2013  . Cardiomyopathy, ischemic 11/19/2013    EF 45-50% with inferior wall hypokinesis by echo 2014  . S/P CABG x 1 06/13/2008    SVG to LAD with open vein harvest  right thigh, LIMA harvested but not utilized by Dr Amador Cunas  . S/P mitral valve replacement with bioprosthetic valve 06/13/2008    39mm St Jude Biocor porcine bioprosthetic tissue valve by Dr Amador Cunas  . S/P Maze operation for atrial fibrillation 06/13/2008    Partial left atrial lesion set using cryothermy for bilateral pulmonary vein isolation by Dr Amador Cunas  . Prosthetic valve dysfunction     Mitral stenosis involving bioprosthetic tissue valve placed 06/13/2008  . Mitral stenosis     Porcine bioprosthetic tissue valve placed 06/13/2008  . Dysrhythmia     HX OF TACHY BRADY  . Pacemaker   . HYPERTHYROIDISM 01/06/2011  . 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 Prostatectomy    . Tonsillectomy    . Cystectomy      Left leg  . Pacemaker placement  07/13/2008    [place] dual chamber-Medtronic at [facility]   . Coronary artery bypass graft  06/13/2008    graft x1 w/vein graft LAD artery by Dr. Amador Cunas at [facility]  . Mitral valve replacement  06/13/2008    #20 St. Jude Bicor mitral valve ;Maze procedure at [facility] by Dr. Amador Cunas  . Cardiac catheterization  11/01/1990  . Skin cancer excision      removed from forehead and right leg, nose/face  . Upper gastrointestinal endoscopy  07/14/2009  erosive reflux esopagitis, Barrett's esophagus  . Colonoscopy  07/14/2009    diverticulosis, internal hemorrhoids  . Cardiovascular stress test  06/03/2010    Persantine perfusion EF 45% mild to moderate ischemia basal and mid inferolateral region  . Cardiovascular stress test  01/01/2008    left ventricle normal,no significant ischemia  . Cardiovascular stress test  09/09/2005     possibility of ischemia inferior wall  . Tee without cardioversion N/A 01/22/2014    Procedure: TRANSESOPHAGEAL ECHOCARDIOGRAM (TEE);  Surgeon: Sanda Klein, MD;   Location: [facility] ENDOSCOPY;  Service: Cardiovascular;  Laterality: N/A;  . Coronary stent placement  03/24/2014    DES to LAD  . Transcatheter aortic valve replacement, transfemoral N/A 04/22/2014    Procedure: TRANSCATHETER AORTIC VALVE REPLACEMENT, TRANSFEMORAL;  Surgeon: [name], MD;  Location: [place];  Service: Open Heart Surgery;  Laterality: N/A;  TAVR-TF (RIGHT SIDE)  . Intraoperative transesophageal echocardiogram N/A 04/22/2014    Procedure: INTRAOPERATIVE TRANSTHORACIC ECHOCARDIOGRAM;  Surgeon: [name], MD;  Location: [place];  Service: Open Heart Surgery;  Laterality: N/A;     Allergies  No Known Allergies  HPI   The patient is a 80 y/o male, followed by Dr. [name], with a past cardiac history of MVP s/p bioprosthetic MVR in 2009, SSS s/p PPM, CAD s/p single vessel CABG and recent rotational atherectomy/stenting of the prox LAD on 03/24/14 (DAPT with ASA + Plavix), PAF and severe symptomatic aortic stenosis, which has been followed by Dr. Burt Knack and Dr. Roxy Manns in the [facility]. He had progression of disease with development of severe symptomatic AS and NYHA Class 3 CHF and underwent TAVR on 04/22/14 by right transfemoral approach. He was discharged on DAPT in the setting of recent DES.   [name] [name] was admitted on 7/11 with a large volume lower GI bleed. He underwent tagged RBC scan followed by a mesenteric arteriogram with technically successful coil embolization of an actively bleeding arcade vessel within the sigmoid colon.  On my interview Mr. Varden felt well post procedure. Denied CP, DOE, palps, LH, dizziness. No angina at any point despite substantial blood loss.   Of note, pt took both ASA/plavix on 7/11.  Inpatient Medications  . [START ON 05/18/2014] amiodarone  200 mg Oral Daily  . fentaNYL      . [START ON 05/18/2014] ferrous sulfate  325 mg Oral Q breakfast  . [START ON 05/19/2014] methimazole  5 mg Oral Q M,W,F  . midazolam      .  pantoprazole (PROTONIX) IV  40 mg Intravenous Q12H  . [START ON 05/18/2014] tamsulosin  0.4 mg Oral QPM    Family History Family History  Problem Relation Age of Onset  . Cancer Mother     Breast Cancer     Social History History   Social History  . Marital Status: Married    Spouse Name: N/A    Number of Children: 4  . Years of Education: N/A   Occupational History  . Retired    Social History Main Topics  . Smoking status: Former Smoker -- 2 years    Types: Pipe    Quit date: 11/07/1965  . Smokeless tobacco: Never Used     Comment: quit over 40 years ago, never smoked cigarettes  . Alcohol Use: No  . Drug Use: No  . Sexual Activity: Not on file   Other Topics Concern  . Not on file   Social History Narrative  . No narrative on file  Review of Systems  General:  No chills, fever, night sweats or weight changes.  Cardiovascular:  No chest pain, dyspnea on exertion, edema, orthopnea, palpitations, paroxysmal nocturnal dyspnea. Dermatological: No rash, lesions/masses Respiratory: No cough, dyspnea Urologic: No hematuria, dysuria Abdominal:   No nausea, vomiting, + BRBPR Neurologic:  No visual changes, wkns, changes in mental status. All other systems reviewed and are otherwise negative except as noted above.  Physical Exam  Blood pressure 98/56, pulse 71, temperature 97.2 F (36.2 C), temperature source Oral, resp. rate 21, height 5\' 10"  (1.778 m), weight 82.9 kg (182 lb 12.2 oz), SpO2 97.00%.  General: Pleasant, NAD Psych: Normal affect. Neuro: Alert and oriented X 3. Moves all extremities spontaneously. HEENT: Normal  Neck: Supple without bruits or JVD. Lungs:  Resp regular and unlabored, CTA. Heart: irregular. Soft systolic murmur.  Abdomen: Soft, non-tender, non-distended, BS + x 4.  Extremities: No clubbing, cyanosis or edema. DP/PT/Radials 2+ and equal bilaterally. No bruits. Skin overlying R TAVR access site healing well. Bandage over LFA access  site CDI.   Labs   Recent Labs  05/17/14 1122  TROPONINI <0.30   Lab Results  Component Value Date   WBC 5.2 05/17/2014   HGB 7.9* 05/17/2014   HCT 24.4* 05/17/2014   MCV 95.8 05/17/2014   PLT 145* 05/17/2014    Recent Labs Lab 05/17/14 1055  NA 148*  K 4.2  CL 111  CO2 25  BUN 29*  CREATININE 0.98  CALCIUM 8.4  PROT 5.6*  BILITOT 0.6  ALKPHOS 81  ALT 8  AST 14  GLUCOSE 105*   Lab Results  Component Value Date   CHOL 112 12/23/2013   HDL 41.50 12/23/2013   LDLCALC 58 12/23/2013   TRIG 62.0 12/23/2013   No results found for this basename: DDIMER    Radiology/Studies  Dg Chest 2 View  04/18/2014   CLINICAL DATA:  Pre cardiac surgery  EXAM: CHEST  2 VIEW  COMPARISON:  04/05/2014  FINDINGS: Cardiomediastinal silhouette is stable. Cardiomegaly again noted. Dual lead cardiac pacemaker is unchanged in position. Status post median sternotomy. Stable linear atelectasis or scarring right base. There is a large retrocardiac diaphragmatic hernia containing stomach and colon. No acute infiltrate or pulmonary edema. Osteopenia and degenerative changes thoracic spine.  IMPRESSION: Cardiomegaly again noted. Dual lead cardiac pacemaker is unchanged in position. Status post median sternotomy. Stable linear atelectasis or scarring right base. There is a large retrocardiac diaphragmatic hernia containing stomach and colon. No acute infiltrate or pulmonary edema.   Electronically Signed   By: Lahoma Crocker M.D.   On: 04/18/2014 14:43   Nm Gi Blood Loss  05/17/2014   CLINICAL DATA:  History of recent tab are procedure, currently on Plavix and aspirin, now with 1 day history of acute rectal bleeding, anemia, hypertension  EXAM: NUCLEAR MEDICINE GASTROINTESTINAL BLEEDING SCAN  TECHNIQUE: Sequential abdominal images were obtained following intravenous administration of Tc-3m labeled red blood cells.  RADIOPHARMACEUTICALS:  25 mCi Tc-71m in-vitro labeled red cells.  COMPARISON:  CTA of the chest,  abdomen and pelvis - 02/21/2014  FINDINGS: The initial anterior projections scintigraphic image demonstrates abnormal radiotracer activity overlying the left mid hemi abdomen which persists and transits both retrograde and anterograde within the descending and more distal sigmoid colon on the acquired subsequent images.  There is homogeneous distribution of radiotracer seen within the liver, heart an intra-abdominal vascular structures.  IMPRESSION: Examination is positive acute lower GI bleed, likely originating within the sigmoid colon of  the left lower abdomen.  Above findings were discussed with Dr. Collene Mares at the time of procedure completion and the patient proceeded to the interventional radiology department for a mesenteric arteriogram and embolization.   Electronically Signed   By: Sandi Mariscal M.D.   On: 05/17/2014 22:56   Dg Chest Portable 1 View In Am  04/23/2014   CLINICAL DATA:  Status post aortic valve replacement.  EXAM: PORTABLE CHEST - 1 VIEW  COMPARISON:  April 22, 2014.  FINDINGS: Stable cardiomegaly. Sternotomy wires are again noted. Left-sided pacemaker is unchanged. Right internal jugular Swan-Ganz catheter has been removed; venous sheath remains. No pneumothorax is noted. Old left clavicular fracture is noted. Status post aortic valve repair. Minimal left pleural effusion is noted. New lingular opacity is noted concerning for subsegmental atelectasis or less likely pneumonia.  IMPRESSION: New lingular opacity is noted concerning for subsegmental atelectasis or less likely pneumonia. Status post aortic valve repair.   Electronically Signed   By: Sabino Dick M.D.   On: 04/23/2014 08:18   Dg Chest Portable 1 View  04/22/2014   CLINICAL DATA:  Status post TAVR.  Evaluate support apparatus.  EXAM: PORTABLE CHEST - 1 VIEW  COMPARISON:  Chest x-ray 04/18/2014.  FINDINGS: Lung volumes appear normal. Extensive airspace consolidation throughout the left lung, particularly in the left upper lobe.  Probable small bilateral pleural effusions. Cephalization of the pulmonary vasculature with indistinctness of interstitial markings, suggesting mild interstitial pulmonary edema. Mild enlargement of the cardiopericardial silhouette. The patient is rotated to the left on today's exam, resulting in distortion of the mediastinal contours and reduced diagnostic sensitivity and specificity for mediastinal pathology. Large retrocardiac opacity, compatible with known large hiatal hernia. Status post median sternotomy. An Edwards Sapien XT valve is noted in the expected location of the aortic valve. Right internal jugular vascular sheath through which a Swan-Ganz catheter has been passed into the distal pulmonic trunk or left main pulmonary artery. Left-sided pacemaker device in position with lead tips projecting over the expected location of the right atrium and right ventricular apex.  IMPRESSION: 1. Postoperative changes and support apparatus, as above. 2. New implanted Edwards Sapien XT valve is seen projecting in expected location of the aortic valve. 3. Extensive airspace consolidation throughout the left lung, concerning for sequela of aspiration, although some of this may be related to atelectasis in this postoperative patient. Close attention on followup studies is recommended. 4. Small bilateral pleural effusions and mild interstitial pulmonary edema. 5. Atherosclerosis.   Electronically Signed   By: Vinnie Langton M.D.   On: 04/22/2014 11:32    ECG AF with ventricular bigeminy (versus pacing every other beat...cannot see pacer spiced by QRS complex would be consistent with paced morphology)   04/23/14 - Left ventricle: The cavity size was normal. There was mild concentric hypertrophy. Systolic function was normal. The estimated ejection fraction was in the range of 55% to 60%. Wall motion was normal; there were no regional wall motion abnormalities. The study is not technically sufficient to  allow evaluation of LV diastolic function. - Ventricular septum: Septal motion showed paradox. - Aortic valve: A 29 mm Edwards Sapien XT THV is seated well. There is no central aortic insufficiency and only minimal to mild paravalvular leak. Peak and mean transaortic gradient are 11 and 8 mmHg, and are normal for this type of prosthesis. - Mitral valve: A 30mm Biocor porcine bioprosthesis is well seated, there is no central regurgitation or paravalvular leak. Transmitral gradients are mildly elevated.  The findings are consistent with mild stenosis. Mean gradient (D): 5 mm Hg. - Left atrium: The atrium was severely dilated. - Right ventricle: Pacer wire or catheter noted in right ventricle. - Tricuspid valve: There was mild regurgitation. - Pulmonary arteries: Systolic pressure was mildly increased. PA peak pressure: 41 mm Hg (S).  Impressions:  - Compared to the prior study on 04/11/2014 the left ventricular ejection has improved and is now normal 50-55%, previously 35-40%. A new Edwards Sapien XT THV is seated well with normal transaortic gradients and only minimal paravalvular leak.   ASSESSMENT AND PLAN 39M with recent DES to pLAD (03/24/2014) and TAVR (04/22/14) who presents with a hemodynamically significant GIB on DAPT. He took both AS/clopidogrel on 05/17/14. He is now s/p successful coil embolization of culprit vessel.   Although minimizing anti-platelet therapy is best for the GIB, a prox LAD in stent thrombosis could be catastrophic to say the least. If Mr. Mcdowell remains stable into the morning would advocate for giving 81mg  ASA on 7/12 (e.g. Not skip any doses). Ideally clopidogrel would be restarted no later than 7/13.   Will continue to follow.   Tobi Bastos, MD 05/17/2014, 11:24 PM

## 2014-05-17 NOTE — Sedation Documentation (Signed)
Left groin exoseal placed. Manual pressure held for 15 mins.

## 2014-05-17 NOTE — H&P (Signed)
[facility] History and Physical  YUNUS STOKLOSA PNP:[id number] DOB: 1929-09-16 DOA: 05/17/2014  Referring physician: EDP PCP: [name], MD   Chief Complaint: rectal bleeding since 3 am.  HPI: MAXUM CASSARINO is a 80 y.o. male with prior h/o hypertension, colonic polyps, comes in for persistent rectal bleeding since 3 am. He denies any abdominal pain or cramping. He denies nasuea, vomiting or fever or chills. On arrival to ED, he was found to be hypotensive and his hemoglobin was 9. After he got 2 liters of luids his BP normalized and his hemoglobin dropped to 7.9 . One unit of prbc transfusion ordered and he was referred to medical service for admission . GI consult from Dr Collene Mares requested and he was admitted to step down. In ED he had moderate amount of blood loss, but his blood pressure remained stable. He denies any other complaints. HE DENIES  Any NSAID use and his last colonoscopy was more than 10 years ago by [facility].    Review of Systems:   Past Medical History  Diagnosis Date  . GASTRIC POLYP 05/13/2009  . COLONIC POLYPS, ADENOMATOUS 01/14/2008  . HYPERLIPIDEMIA 06/01/2007  . ANEMIA, IRON DEFICIENCY 10/13/2008  . COAGULOPATHY, COUMADIN-INDUCED 01/14/2008  . ANXIETY 09/04/2008  . HYPERTENSION 06/01/2007  . ATRIAL FIBRILLATION, CHRONIC 01/14/2008    on warfarin since 2007  . UNSPECIFIED VENOUS INSUFFICIENCY 09/18/2008  . PLEURAL EFFUSION, LEFT 10/13/2008  . GERD 06/01/2007  . BARRETTS ESOPHAGUS 03/20/2009  . PROSTATE CANCER, HX OF 01/14/2008  . CATARACTS, BILATERAL, HX OF 01/14/2008  . CEREBROVASCULAR ACCIDENT, HX OF 08/18/2008  . PERSONAL HX COLONIC POLYPS 01/01/2010  . BASAL CELL CARCINOMA OF SKIN SITE UNSPECIFIED 09/28/2010  . Hyperglycemia   . Elevated PSA   . Restless leg syndrome   . Mitral regurgitation 06/13/2008    mitral valve replacement #20 St. Jude Biocor; Maze procedure by Dr. Amador Cunas  at [facility]  . CORONARY ARTERY DISEASE 06/01/2007    Cath 04/03/2008; Cath  11/01/1990  . Tachy-brady syndrome 07/13/2008    medtronic Adapta gen. model ADDR01 ,serial # [id number] H by Dr  [name] at [facility]  . Peripheral artery disease 06/16/2010    LEA doppler-left ABI nml 0.61 calcified vessels;right ABI  0.68  . Edema, peripheral 07/30/2009    LEV doppler- no thrombus or thrombophlebitis  . Aortic valvular disorder 08/16/2012    echo EF 45-50% LV mildly reduce,Biocor mitral valve,mild to mod. tricuspid regrug.,mild to mod. aortic regrug.  . Mitral valve disorder 09/08/2011    echo EF 50-55% LV normal  . Aortic valvular stenosis 11/19/2013  . Cardiomyopathy, ischemic 11/19/2013    EF 45-50% with inferior wall hypokinesis by echo 2014  . S/P CABG x 1 06/13/2008    SVG to LAD with open vein harvest right thigh, LIMA harvested but not utilized by Dr Amador Cunas  . S/P mitral valve replacement with bioprosthetic valve 06/13/2008    21mm St Jude Biocor porcine bioprosthetic tissue valve by Dr Amador Cunas  . S/P Maze operation for atrial fibrillation 06/13/2008    Partial left atrial lesion set using cryothermy for bilateral pulmonary vein isolation by Dr Amador Cunas  . Prosthetic valve dysfunction     Mitral stenosis involving bioprosthetic tissue valve placed 06/13/2008  . Mitral stenosis     Porcine bioprosthetic tissue valve placed 06/13/2008  . Dysrhythmia     HX OF TACHY BRADY  . Pacemaker   . HYPERTHYROIDISM 01/06/2011  . S/P TAVR (transcatheter aortic valve replacement)  04/22/2014    29 mm Edwards Sapien XT transcatheter heart valve placed via open right transfemoral approach   Past Surgical History  Procedure Laterality Date  . Prostatectomy    . Tonsillectomy    . Cystectomy      Left leg  . Pacemaker placement  07/13/2008    Adapata dual chamber-Medtronic at [facility]   . Coronary artery bypass graft  06/13/2008    graft x1 w/vein graft LAD artery by Dr. Amador Cunas at [facility]  . Mitral valve replacement  06/13/2008    #20 St. Jude Bicor mitral valve ;Maze procedure at [facility]  by Dr. Amador Cunas  . Cardiac catheterization  11/01/1990  . Skin cancer excision      removed from forehead and right leg, nose/face  . Upper gastrointestinal endoscopy  07/14/2009    erosive reflux esopagitis, Barrett's esophagus  . Colonoscopy  07/14/2009    diverticulosis, internal hemorrhoids  . Cardiovascular stress test  06/03/2010    Persantine perfusion EF 45% mild to moderate ischemia basal and mid inferolateral region  . Cardiovascular stress test  01/01/2008    left ventricle normal,no significant ischemia  . Cardiovascular stress test  09/09/2005     possibility of ischemia inferior wall  . Tee without cardioversion N/A 01/22/2014    Procedure: TRANSESOPHAGEAL ECHOCARDIOGRAM (TEE);  Surgeon: Sanda Klein, MD;  Location: [facility] ENDOSCOPY;  Service: Cardiovascular;  Laterality: N/A;  . Coronary stent placement  03/24/2014    DES to LAD  . Transcatheter aortic valve replacement, transfemoral N/A 04/22/2014    Procedure: TRANSCATHETER AORTIC VALVE REPLACEMENT, TRANSFEMORAL;  Surgeon: [name], MD;  Location: [place];  Service: Open Heart Surgery;  Laterality: N/A;  TAVR-TF (RIGHT SIDE)  . Intraoperative transesophageal echocardiogram N/A 04/22/2014    Procedure: INTRAOPERATIVE TRANSTHORACIC ECHOCARDIOGRAM;  Surgeon: [name], MD;  Location: [place];  Service: Open Heart Surgery;  Laterality: N/A;   Social History:  reports that he quit smoking about 48 years ago. His smoking use included Pipe. He has never used smokeless tobacco. He reports that he does not drink alcohol or use illicit drugs.  No Known Allergies  Family History  Problem Relation Age of Onset  . Cancer Mother     Breast Cancer     Prior to Admission medications   Medication Sig Start Date End Date Taking? Authorizing Provider  amiodarone (PACERONE) 200 MG tablet Take 1 tablet (200 mg total) by mouth daily. 05/12/14  Yes [name] [name], MD  aspirin EC 81 MG tablet Take 81 mg by mouth daily.   Yes Historical  Provider, MD  clopidogrel (PLAVIX) 75 MG tablet Take 75 mg by mouth daily with breakfast.   Yes Historical Provider, MD  ferrous sulfate 325 (65 FE) MG tablet Take 325 mg by mouth daily with breakfast.   Yes Historical Provider, MD  methimazole (TAPAZOLE) 5 MG tablet Take 5 mg by mouth every Monday, Wednesday, and Friday.  02/25/14  Yes [name], MD  metoprolol succinate (TOPROL-XL) 50 MG 24 hr tablet Take 1 tablet (50 mg total) by mouth daily. Take with or immediately following a meal. 04/24/14  Yes Brittainy Simmons, PA-C  Multiple Vitamin (MULTIVITAMIN WITH MINERALS) TABS Take 1 tablet by mouth daily. Centrum Silver   Yes Historical Provider, MD  pantoprazole (PROTONIX) 40 MG tablet Take 40 mg by mouth 2 (two) times daily.   Yes Historical Provider, MD  simvastatin (ZOCOR) 40 MG tablet Take 1 tablet (40 mg total) by mouth at bedtime. 05/12/14  Yes Hilliard Clark  Loanne Drilling, MD  Tamsulosin HCl (FLOMAX) 0.4 MG CAPS Take 0.4 mg by mouth every evening.    Yes Historical Provider, MD   Physical Exam: Filed Vitals:   05/17/14 1330  BP: 102/61  Pulse: 70  Temp:   Resp: 16    BP 102/61  Pulse 70  Temp(Src) 97.6 F (36.4 C) (Oral)  Resp 16  Ht 5\' 10"  (1.778 m)  Wt 77.111 kg (170 lb)  BMI 24.39 kg/m2  SpO2 100%  General:  Appears calm and comfortable Eyes: PERRL, normal lids, irises & conjunctiva ENT: grossly normal hearing, lips & tongue Neck: no LAD, masses or thyromegaly Cardiovascular: RRR, no m/r/g. No LE edema. Telemetry: SR, no arrhythmias  Respiratory: CTA bilaterally, no w/r/r. Normal respiratory effort. Abdomen: soft, ntnd Skin: no rash or induration seen on limited exam Musculoskeletal: grossly normal tone BUE/BLE Psychiatric: grossly normal mood and affect, speech fluent and appropriate Neurologic: grossly non-focal.          Labs on Admission:  Basic Metabolic Panel:  Recent Labs Lab 05/17/14 1055  NA 148*  K 4.2  CL 111  CO2 25  GLUCOSE 105*  BUN 29*  CREATININE 0.98   CALCIUM 8.4   Liver Function Tests:  Recent Labs Lab 05/17/14 1055  AST 14  ALT 8  ALKPHOS 81  BILITOT 0.6  PROT 5.6*  ALBUMIN 3.1*   No results found for this basename: LIPASE, AMYLASE,  in the last 168 hours No results found for this basename: AMMONIA,  in the last 168 hours CBC:  Recent Labs Lab 05/17/14 1055  WBC 5.2  HGB 9.0*  HCT 27.6*  MCV 95.8  PLT 145*   Cardiac Enzymes:  Recent Labs Lab 05/17/14 1122  TROPONINI <0.30    BNP (last 3 results)  Recent Labs  12/05/13 1654 04/05/14 1734  PROBNP 382.0* 3725.0*   CBG: No results found for this basename: GLUCAP,  in the last 168 hours  Radiological Exams on Admission: No results found.  EKG: chronic afib.   Assessment/Plan Active Problems:   GI bleed   Rectal bleed   Persistent rectal bleeding: Lower GI bleed .  Admit to step down.  NPO , IV FLUIDS. 2 units of prbc transfusion ordered.  IV protonix, and GI consulted.  Serial H&H ordered. Elevated BUN,INR of 1.3 Transfuse to keep hemoglobin greater than 9.   H/o extensive cardiac disease with MVR in 2009 s/p PPM , recent stenting of the proximal LAD on 5/15 and AVR on 6/15 by Dr Burt Knack: - will request cardiology to see him as we stopped his aspirin and plavix .    Paroxysmal Atrial fibrillation: - rate controlled on amiodarone.   Borderline BP's/ hypotension: - holding all BP medications.  - 2 units of prbc transfusion ordered and he is currently getting them.   Mild hypernatremia.   DVT prophylaxis.      Code Status: full code.  Family Communication: wife at bedside.  Disposition Plan: admit to step down  Time spent: 65 min  [facility] Pager [phone]  **Disclaimer: This note may have been dictated with voice recognition software. Similar sounding words can inadvertently be transcribed and this note may contain transcription errors which may not have been corrected upon publication of note.**

## 2014-05-18 DIAGNOSIS — R578 Other shock: Secondary | ICD-10-CM

## 2014-05-18 DIAGNOSIS — K922 Gastrointestinal hemorrhage, unspecified: Secondary | ICD-10-CM

## 2014-05-18 LAB — MAGNESIUM: Magnesium: 2.1 mg/dL (ref 1.5–2.5)

## 2014-05-18 LAB — CBC
HEMATOCRIT: 26.8 % — AB (ref 39.0–52.0)
Hemoglobin: 9 g/dL — ABNORMAL LOW (ref 13.0–17.0)
MCH: 30.8 pg (ref 26.0–34.0)
MCHC: 33.6 g/dL (ref 30.0–36.0)
MCV: 91.8 fL (ref 78.0–100.0)
Platelets: 106 10*3/uL — ABNORMAL LOW (ref 150–400)
RBC: 2.92 MIL/uL — AB (ref 4.22–5.81)
RDW: 18 % — ABNORMAL HIGH (ref 11.5–15.5)
WBC: 12.2 10*3/uL — AB (ref 4.0–10.5)

## 2014-05-18 LAB — COMPREHENSIVE METABOLIC PANEL
ALK PHOS: 60 U/L (ref 39–117)
ALT: 7 U/L (ref 0–53)
AST: 11 U/L (ref 0–37)
Albumin: 2.6 g/dL — ABNORMAL LOW (ref 3.5–5.2)
Anion gap: 16 — ABNORMAL HIGH (ref 5–15)
BUN: 31 mg/dL — ABNORMAL HIGH (ref 6–23)
CO2: 18 mEq/L — ABNORMAL LOW (ref 19–32)
Calcium: 7.8 mg/dL — ABNORMAL LOW (ref 8.4–10.5)
Chloride: 115 mEq/L — ABNORMAL HIGH (ref 96–112)
Creatinine, Ser: 0.8 mg/dL (ref 0.50–1.35)
GFR calc Af Amer: >90 mL/min (ref >90)
GFR calc non Af Amer: 80 mL/min — ABNORMAL LOW (ref >90)
GLUCOSE: 104 mg/dL — AB (ref 70–99)
POTASSIUM: 4.1 meq/L (ref 3.7–5.3)
SODIUM: 149 meq/L — AB (ref 137–147)
Total Bilirubin: 0.7 mg/dL (ref 0.3–1.2)
Total Protein: 4.7 g/dL — ABNORMAL LOW (ref 6.0–8.3)

## 2014-05-18 LAB — HEMOGLOBIN AND HEMATOCRIT, BLOOD
HCT: 24.5 % — ABNORMAL LOW (ref 39.0–52.0)
HCT: 25.8 % — ABNORMAL LOW (ref 39.0–52.0)
HCT: 24.8 % — ABNORMAL LOW (ref 39.0–52.0)
HEMATOCRIT: 26 % — AB (ref 39.0–52.0)
HEMOGLOBIN: 8.7 g/dL — AB (ref 13.0–17.0)
Hemoglobin: 8.2 g/dL — ABNORMAL LOW (ref 13.0–17.0)
Hemoglobin: 8.3 g/dL — ABNORMAL LOW (ref 13.0–17.0)
Hemoglobin: 8.5 g/dL — ABNORMAL LOW (ref 13.0–17.0)

## 2014-05-18 LAB — GLUCOSE, CAPILLARY: Glucose-Capillary: 89 mg/dL (ref 70–99)

## 2014-05-18 LAB — PROTIME-INR
INR: 1.27 (ref 0.00–1.49)
Prothrombin Time: 15.9 seconds — ABNORMAL HIGH (ref 11.6–15.2)

## 2014-05-18 LAB — PHOSPHORUS: Phosphorus: 4.2 mg/dL (ref 2.3–4.6)

## 2014-05-18 LAB — APTT: aPTT: 32 seconds (ref 24–37)

## 2014-05-18 MED ORDER — SODIUM CHLORIDE 0.9 % IV SOLN
INTRAVENOUS | Status: DC
  Administered 2014-05-18: 01:00:00 via INTRAVENOUS

## 2014-05-18 MED ORDER — ASPIRIN 81 MG PO CHEW
81.0000 mg | CHEWABLE_TABLET | Freq: Every day | ORAL | Status: DC
  Administered 2014-05-18: 81 mg via ORAL
  Filled 2014-05-18 (×2): qty 1

## 2014-05-18 NOTE — Consult Note (Signed)
PULMONARY / CRITICAL CARE MEDICINE   Name: [name] [name] MRN: [id number] DOB: 04/20/1929    ADMISSION DATE:  05/17/2014 CONSULTATION DATE:  05/18/14  REFERRING MD :  [facility], IR, GI PRIMARY SERVICE: [facility]  CHIEF COMPLAINT:  Gi bleed  BRIEF PATIENT DESCRIPTION: 80 yr old h/o colonic polyps presents GI bleed h/o TAVR (plavix, asa), embolized  SIGNIFICANT EVENTS / STUDIES:  7/11 bleeding scan>>>Examination is positive acute lower GI bleed, likely originating  within the sigmoid colon of the left lower abdomen 7/11- IR Embolization 7/12- no pressors, 2 liters fluids, 3 units prbc given  LINES / TUBES:   CULTURES:   ANTIBIOTICS:   HISTORY OF PRESENT ILLNESS:  [name] [name] is a 80 y.o. male with prior h/o hypertension, colonic polyps, comes in for persistent rectal bleeding since 3 am. He denies any abdominal pain or cramping. He denies nasuea, vomiting or fever or chills. On arrival to ED, he was found to be hypotensive and his hemoglobin was 9 in ED. Received fluid and improved, Continued to have excesive rectal bleeding. Nuc scan pos sigmoid, GI consult , to IR, embolized, returned to ICU , no pressors.  S/p 3 units prbc.   PAST MEDICAL HISTORY :  Past Medical History  Diagnosis Date  . GASTRIC POLYP 05/13/2009  . COLONIC POLYPS, ADENOMATOUS 01/14/2008  . HYPERLIPIDEMIA 06/01/2007  . ANEMIA, IRON DEFICIENCY 10/13/2008  . COAGULOPATHY, COUMADIN-INDUCED 01/14/2008  . ANXIETY 09/04/2008  . HYPERTENSION 06/01/2007  . ATRIAL FIBRILLATION, CHRONIC 01/14/2008    on warfarin since 2007  . UNSPECIFIED VENOUS INSUFFICIENCY 09/18/2008  . PLEURAL EFFUSION, LEFT 10/13/2008  . GERD 06/01/2007  . BARRETTS ESOPHAGUS 03/20/2009  . PROSTATE CANCER, HX OF 01/14/2008  . CATARACTS, BILATERAL, HX OF 01/14/2008  . CEREBROVASCULAR ACCIDENT, HX OF 08/18/2008  . PERSONAL HX COLONIC POLYPS 01/01/2010  . BASAL CELL CARCINOMA OF SKIN SITE UNSPECIFIED 09/28/2010  . Hyperglycemia   . Elevated PSA   . Restless leg  syndrome   . Mitral regurgitation 06/13/2008    mitral valve replacement #20 St. Jude Biocor; Maze procedure by Dr. Amador Cunas  at [facility]  . CORONARY ARTERY DISEASE 06/01/2007    Cath 04/03/2008; Cath 11/01/1990  . Tachy-brady syndrome 07/13/2008    medtronic Adapta gen. model ADDR01 ,serial # [id number] H by Dr  [name] at [facility]  . Peripheral artery disease 06/16/2010    LEA doppler-left ABI nml 0.61 calcified vessels;right ABI  0.68  . Edema, peripheral 07/30/2009    LEV doppler- no thrombus or thrombophlebitis  . Aortic valvular disorder 08/16/2012    echo EF 45-50% LV mildly reduce,Biocor mitral valve,mild to mod. tricuspid regrug.,mild to mod. aortic regrug.  . Mitral valve disorder 09/08/2011    echo EF 50-55% LV normal  . Aortic valvular stenosis 11/19/2013  . Cardiomyopathy, ischemic 11/19/2013    EF 45-50% with inferior wall hypokinesis by echo 2014  . S/P CABG x 1 06/13/2008    SVG to LAD with open vein harvest right thigh, LIMA harvested but not utilized by Dr Amador Cunas  . S/P mitral valve replacement with bioprosthetic valve 06/13/2008    24mm St Jude Biocor porcine bioprosthetic tissue valve by Dr Amador Cunas  . S/P Maze operation for atrial fibrillation 06/13/2008    Partial left atrial lesion set using cryothermy for bilateral pulmonary vein isolation by Dr Amador Cunas  . Prosthetic valve dysfunction     Mitral stenosis involving bioprosthetic tissue valve placed 06/13/2008  . Mitral stenosis     Porcine bioprosthetic  tissue valve placed 06/13/2008  . Dysrhythmia     HX OF TACHY BRADY  . Pacemaker   . HYPERTHYROIDISM 01/06/2011  . 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 Prostatectomy    . Tonsillectomy    . Cystectomy      Left leg  . Pacemaker placement  07/13/2008    Adapata dual chamber-Medtronic at [facility]   .  Coronary artery bypass graft  06/13/2008    graft x1 w/vein graft LAD artery by Dr. Amador Cunas at [facility]  . Mitral valve replacement  06/13/2008    #20 St. Jude Bicor mitral valve ;Maze procedure at [facility] by Dr. Amador Cunas  . Cardiac catheterization  11/01/1990  . Skin cancer excision      removed from forehead and right leg, nose/face  . Upper gastrointestinal endoscopy  07/14/2009    erosive reflux esopagitis, Barrett's esophagus  . Colonoscopy  07/14/2009    diverticulosis, internal hemorrhoids  . Cardiovascular stress test  06/03/2010    Persantine perfusion EF 45% mild to moderate ischemia basal and mid inferolateral region  . Cardiovascular stress test  01/01/2008    left ventricle normal,no significant ischemia  . Cardiovascular stress test  09/09/2005     possibility of ischemia inferior wall  . Tee without cardioversion N/A 01/22/2014    Procedure: TRANSESOPHAGEAL ECHOCARDIOGRAM (TEE);  Surgeon: Sanda Klein, MD;  Location: [facility] ENDOSCOPY;  Service: Cardiovascular;  Laterality: N/A;  . Coronary stent placement  03/24/2014    DES to LAD  . Transcatheter aortic valve replacement, transfemoral N/A 04/22/2014    Procedure: TRANSCATHETER AORTIC VALVE REPLACEMENT, TRANSFEMORAL;  Surgeon: [name], MD;  Location: [place];  Service: Open Heart Surgery;  Laterality: N/A;  TAVR-TF (RIGHT SIDE)  . Intraoperative transesophageal echocardiogram N/A 04/22/2014    Procedure: INTRAOPERATIVE TRANSTHORACIC ECHOCARDIOGRAM;  Surgeon: [name], MD;  Location: [place];  Service: Open Heart Surgery;  Laterality: N/A;   Prior to Admission medications   Medication Sig Start Date End Date Taking? Authorizing Provider  amiodarone (PACERONE) 200 MG tablet Take 1 tablet (200 mg total) by mouth daily. 05/12/14  Yes [name] [name], MD  aspirin EC 81 MG tablet Take 81 mg by mouth daily.   Yes Historical Provider, MD  clopidogrel (PLAVIX) 75 MG tablet Take 75 mg by mouth daily with breakfast.   Yes Historical Provider,  MD  ferrous sulfate 325 (65 FE) MG tablet Take 325 mg by mouth daily with breakfast.   Yes Historical Provider, MD  methimazole (TAPAZOLE) 5 MG tablet Take 5 mg by mouth every Monday, Wednesday, and Friday.  02/25/14  Yes [name], MD  metoprolol succinate (TOPROL-XL) 50 MG 24 hr tablet Take 1 tablet (50 mg total) by mouth daily. Take with or immediately following a meal. 04/24/14  Yes Brittainy Simmons, PA-C  Multiple Vitamin (MULTIVITAMIN WITH MINERALS) TABS Take 1 tablet by mouth daily. Centrum Silver   Yes Historical Provider, MD  pantoprazole (PROTONIX) 40 MG tablet Take 40 mg by mouth 2 (two) times daily.   Yes Historical Provider, MD  simvastatin (ZOCOR) 40 MG tablet Take 1 tablet (40 mg total) by mouth at bedtime. 05/12/14  Yes [name], MD  Tamsulosin HCl (FLOMAX) 0.4 MG CAPS Take 0.4 mg by mouth every evening.    Yes Historical Provider, MD   No Known Allergies  FAMILY HISTORY:  Family History  Problem Relation Age of Onset  . Cancer Mother     Breast Cancer   SOCIAL HISTORY:  reports that he quit smoking about 48 years ago. His smoking use included Pipe. He has never used smokeless tobacco. He reports that he does not drink alcohol or use illicit drugs.  REVIEW OF SYSTEMS:  No sob, no vomiting bleed, no burning urination, POS freq urination, all else neg, no sig wt loss  SUBJECTIVE: no pain  VITAL SIGNS: Temp:  [97.2 F (36.2 C)-98 F (36.7 C)] 97.6 F (36.4 C) (07/12 0000) Pulse Rate:  [35-84] 70 (07/12 0145) Resp:  [11-23] 20 (07/12 0145) BP: (73-129)/(37-80) 87/54 mmHg (07/12 0145) SpO2:  [94 %-100 %] 98 % (07/12 0145) Weight:  [77.111 kg (170 lb)-82.9 kg (182 lb 12.2 oz)] 82.9 kg (182 lb 12.2 oz) (07/11 1615) HEMODYNAMICS:   VENTILATOR SETTINGS:   INTAKE / OUTPUT: Intake/Output     07/11 0701 - 07/12 0700   I.V. (mL/kg) 284.2 (3.4)   Blood 1280   IV Piggyback 500   Total Intake(mL/kg) 2064.2 (24.9)   Net +2064.2         PHYSICAL  EXAMINATION: General:  Awake cooperative, no distress Neuro:  Perr, nonfocal, calm, equal strength HEENT:  jvd wnl Cardiovascular:  s1 s2 RRR V paced Lungs:  CTA Abdomen:  Soft, BS wnl , no r/g Musculoskeletal:  Ext edema none Skin:  No rash  LABS:  CBC  Recent Labs Lab 05/17/14 1055 05/17/14 1415 05/17/14 2332  WBC 5.2  --   --   HGB 9.0* 7.9* 8.8*  HCT 27.6* 24.4* 26.3*  PLT 145*  --   --    Coag's  Recent Labs Lab 05/17/14 1055  INR 1.30   BMET  Recent Labs Lab 05/17/14 1055  NA 148*  K 4.2  CL 111  CO2 25  BUN 29*  CREATININE 0.98  GLUCOSE 105*   Electrolytes  Recent Labs Lab 05/17/14 1055  CALCIUM 8.4   Sepsis Markers No results found for this basename: LATICACIDVEN, PROCALCITON, O2SATVEN,  in the last 168 hours ABG No results found for this basename: PHART, PCO2ART, PO2ART,  in the last 168 hours Liver Enzymes  Recent Labs Lab 05/17/14 1055  AST 14  ALT 8  ALKPHOS 81  BILITOT 0.6  ALBUMIN 3.1*   Cardiac Enzymes  Recent Labs Lab 05/17/14 1122  TROPONINI <0.30   Glucose  Recent Labs Lab 05/17/14 2331  GLUCAP 89    Imaging Nm Gi Blood Loss  05/17/2014   CLINICAL DATA:  History of recent tab are procedure, currently on Plavix and aspirin, now with 1 day history of acute rectal bleeding, anemia, hypertension  EXAM: NUCLEAR MEDICINE GASTROINTESTINAL BLEEDING SCAN  TECHNIQUE: Sequential abdominal images were obtained following intravenous administration of Tc-74m labeled red blood cells.  RADIOPHARMACEUTICALS:  25 mCi Tc-56m in-vitro labeled red cells.  COMPARISON:  CTA of the chest, abdomen and pelvis - 02/21/2014  FINDINGS: The initial anterior projections scintigraphic image demonstrates abnormal radiotracer activity overlying the left mid hemi abdomen which persists and transits both retrograde and anterograde within the descending and more distal sigmoid colon on the acquired subsequent images.  There is homogeneous distribution  of radiotracer seen within the liver, heart an intra-abdominal vascular structures.  IMPRESSION: Examination is positive acute lower GI bleed, likely originating within the sigmoid colon of the left lower abdomen.  Above findings were discussed with Dr. Collene Mares at the time of procedure completion and the  patient proceeded to the interventional radiology department for a mesenteric arteriogram and embolization.   Electronically Signed   By: Sandi Mariscal M.D.   On: 05/17/2014 22:56     CXR: none  ASSESSMENT / PLAN:  PULMONARY A:At risk pulm edema s/o Transfusion products P:   Am pcxr for edema  Keep sats greater 92%, currently on RA  CARDIOVASCULAR A: Extensive cardiac h/o, TAVR, mitral valve replacement, at risk hemorrhagic shock P:  Remains off plavix, asa for now, given h/o likely early restart, cards following Cbc q6h Tele MAP goal 55-60 given valvular dz Large bore IC access 18 if able  RENAL A:  At risk hyperkalemia with transfusion P:   bmet in am  Avoid crystalloid resus, if needed, given PRBC resuscitation - kvo  GASTROINTESTINAL A:  GI bleed, s/p Embolization arcade vessel sigmoid P:   Cbc q6h Npo  HEMATOLOGIC A:  Anemia, bleeding GI resolved, h/o thrombocytopenia 6/18 P:  scd Cbc to q6h coags in am, for consumption coagulapthhy  INFECTIOUS A:  No evidence infection P:   Follow fever curve  TODAY'S SUMMARY:  Improved after Embolization, cbc q6h, NPO, will need d/w cards for safety early restart plavix, asa given cardiac dz needs  I have personally obtained a history, examined the patient, evaluated laboratory and imaging results, formulated the assessment and plan and placed orders.   Lavon Paganini. Titus Mould, MD, [place] Pgr: [place] [facility]  Pulmonary and [place] Pager: [phone]  05/18/2014, 1:55 AM

## 2014-05-18 NOTE — Progress Notes (Addendum)
PULMONARY / CRITICAL CARE MEDICINE   Name: [name] [name] MRN: [id number] DOB: 1929-09-25    ADMISSION DATE:  05/17/2014 CONSULTATION DATE:  05/18/14  REFERRING MD :  [facility], IR, GI PRIMARY SERVICE: [facility]  CHIEF COMPLAINT:  Gi bleed  BRIEF PATIENT DESCRIPTION: 80 yr old h/o colonic polyps presents GI bleed h/o TAVR (plavix, asa), embolized  SIGNIFICANT EVENTS / STUDIES:  7/11 bleeding scan>>>Examination is positive acute lower GI bleed, likely originating  within the sigmoid colon of the left lower abdomen 7/11- IR Embolization 7/12- no pressors, 2 liters fluids, 3 units prbc given  LINES / TUBES:   CULTURES:   ANTIBIOTICS:    SUBJECTIVE: no pain Staff md note: no active bleed. MAP 64 with sbp 86 at timeo staff md rounds-> repeat 91/60 with map 68. S/p embolization  VITAL SIGNS: Temp:  [97.2 F (36.2 C)-98 F (36.7 C)] 97.9 F (36.6 C) (07/12 0735) Pulse Rate:  [35-138] 70 (07/12 0600) Resp:  [11-23] 16 (07/12 0600) BP: (73-129)/(37-80) 94/45 mmHg (07/12 0600) SpO2:  [94 %-100 %] 100 % (07/12 0600) Weight:  [77.111 kg (170 lb)-82.9 kg (182 lb 12.2 oz)] 82.9 kg (182 lb 12.2 oz) (07/11 1615) HEMODYNAMICS:   VENTILATOR SETTINGS:   INTAKE / OUTPUT: Intake/Output     07/11 0701 - 07/12 0700 07/12 0701 - 07/13 0700   I.V. (mL/kg) 331.7 (4)    Blood 1280    IV Piggyback 500    Total Intake(mL/kg) 2111.7 (25.5)    Urine (mL/kg/hr) 200    Stool 150    Total Output 350     Net +1761.7            PHYSICAL EXAMINATION: General:  Awake cooperative, no distress Neuro:  Perr, nonfocal, calm, equal strength HEENT:  jvd wnl Cardiovascular:  s1 s2 RRR V paced Lungs:  CTA Abdomen:  Soft, BS wnl , no r/g, non tender  Musculoskeletal:  Ext edema none Skin:  No rash but looks a bit pale  LABS: PULMONARY No results found for this basename: PHART, PCO2, PCO2ART, PO2, PO2ART, HCO3, TCO2, O2SAT,  in the last 168 hours  CBC  Recent Labs Lab 05/17/14 1055   05/17/14 2332 05/18/14 0155 05/18/14 0845  HGB 9.0*  < > 8.8* 8.2* 8.7*  HCT 27.6*  < > 26.3* 24.5* 25.8*  WBC 5.2  --   --   --   --   PLT 145*  --   --   --   --   < > = values in this interval not displayed.  COAGULATION  Recent Labs Lab 05/17/14 1055 05/18/14 0845  INR 1.30 1.27    CARDIAC   Recent Labs Lab 05/17/14 1122  TROPONINI <0.30   No results found for this basename: PROBNP,  in the last 168 hours   CHEMISTRY  Recent Labs Lab 05/17/14 1055 05/18/14 0845  NA 148* 149*  K 4.2 4.1  CL 111 115*  CO2 25 18*  GLUCOSE 105* 104*  BUN 29* 31*  CREATININE 0.98 0.80  CALCIUM 8.4 7.8*   Estimated Creatinine Clearance: 72.2 ml/min (by C-G formula based on Cr of 0.8).   LIVER  Recent Labs Lab 05/17/14 1055 05/18/14 0845  AST 14 11  ALT 8 7  ALKPHOS 81 60  BILITOT 0.6 0.7  PROT 5.6* 4.7*  ALBUMIN 3.1* 2.6*  INR 1.30 1.27     INFECTIOUS No results found for this basename: LATICACIDVEN, PROCALCITON,  in the last 168 hours  ENDOCRINE CBG (last 3)   Recent Labs  05/17/14 2331  GLUCAP 89         IMAGING x48h  Nm Gi Blood Loss  05/17/2014   CLINICAL DATA:  History of recent tab are procedure, currently on Plavix and aspirin, now with 1 day history of acute rectal bleeding, anemia, hypertension  EXAM: NUCLEAR MEDICINE GASTROINTESTINAL BLEEDING SCAN  TECHNIQUE: Sequential abdominal images were obtained following intravenous administration of Tc-50mlabeled red blood cells.  RADIOPHARMACEUTICALS:  25 mCi Tc-929mn-vitro labeled red cells.  COMPARISON:  CTA of the chest, abdomen and pelvis - 02/21/2014  FINDINGS: The initial anterior projections scintigraphic image demonstrates abnormal radiotracer activity overlying the left mid hemi abdomen which persists and transits both retrograde and anterograde within the descending and more distal sigmoid colon on the acquired subsequent images.  There is homogeneous distribution of radiotracer seen  within the liver, heart an intra-abdominal vascular structures.  IMPRESSION: Examination is positive acute lower GI bleed, likely originating within the sigmoid colon of the left lower abdomen.  Above findings were discussed with Dr. MaCollene Marest the time of procedure completion and the patient proceeded to the interventional radiology department for a mesenteric arteriogram and embolization.   Electronically Signed   By: JoSandi Mariscal.D.   On: 05/17/2014 22:56   Ir Angiogram Visceral Selective  05/18/2014   INDICATION: History of recent TAVR procedure, with subsequent initiation of Plavix and aspirin, now admitted with acute lower GI bleed with nuclear medicine tagged red blood cell scan compatible with acute lower GI bleed within the sigmoid colon of the left lower abdomen.  EXAM: 1. ULTRASOUND GUIDANCE FOR ARTERIAL ACCESS 2. SUPERIOR AND INFERIOR MESENTERIC ARTERIOGRAMS 3. SUB SELECTIVE SIGMOID ARTERIOGRAM (3rd ORDER) 4. SUB SELECTIVE SIGMOID ARCADE ARTERIOGRAM (3rd ORDER) AND PERCUTANEOUS COIL EMBOLIZATION  COMPARISON:  Nuclear medicine tagged bleeding scan - 05/17/2014; CTA of the chest, abdomen pelvis - 02/21/2014  MEDICATIONS: Fentanyl 100 mcg IV; Versed 3 mg IV  CONTRAST:  10063mMNIPAQUE IOHEXOL 300 MG/ML  SOLN  FLUOROSCOPY TIME:  31 minutes 6 seconds.  ACCESS: Left common femoral artery; hemostasis achieved with the deployment of an Exoseal device and manual compression  COMPLICATIONS: None immediate  TECHNIQUE: Informed written consent was obtained from the patient and the patient's white after a discussion of the risks, benefits and alternatives to treatment. Questions regarding the procedure were encouraged and answered. A timeout was performed prior to the initiation of the procedure.  Given the presence of a healing incision at the patient's right groin secondary to recent TAVR procedure, the left groin was selected for arterial access. As such, the left groin was prepped and draped in the usual sterile  fashion, and a sterile drape was applied covering the operative field. Maximum barrier sterile technique with sterile gowns and gloves were used for the procedure. A timeout was performed prior to the initiation of the procedure. Local anesthesia was provided with 1% lidocaine.  The left femoral head was marked fluoroscopically. Under ultrasound guidance, the left common femoral artery was accessed with a micropuncture kit after the overlying soft tissues were anesthetized with 1% lidocaine. An ultrasound image was saved for documentation purposes. The micropuncture sheath was exchanged for a 5 [place] sheath over a Bentson wire. A closure arteriogram was performed through the side of the sheath confirming access within the right common femoral artery.  Over a Bentson wire, a Mickelson catheter was advanced to the level of the thoracic aorta where it was back  bled and flushed. The catheter was then utilized to select the SMA and several superior mesenteric arteriograms were performed in various obliquities.  The Mickelson catheter was then utilized to select the IMA and several inferior mesenteric arteriograms were performed in various obliquities demonstrating an ill-defined area of active extravasation originating from a distal sigmoid arcade vessel compatible with the findings on preceding nuclear medicine tagged will blood cell scan.  With the use of a Synchro 14 soft tip micro wire, a regular Renegade micro catheter was advanced to the level of the sigmoid artery and a selective sigmoid arteriogram was performed. The micro catheter was then utilized to select the providing arcade vessel and a sub selective sigmoid arcade arteriogram was performed.  Prolonged efforts were made to sub select the caudal division of this distal sigmoid arcade supplying the area of active extravasation however this initially proved unsuccessful. As such, the cranial division of this distal sigmoid arcade was percutaneously  coil embolized with multiple overlapping pushable 2 mm diameter coils to the origin of the caudal tributary. With the dominant cranial division now embolized, the caudal division providing the area of active extravasation was then successfully cannulated and percutaneously coil embolized with multiple overlapping 2 mm diameter interlock coils to the level of the origin of the sigmoid arcade vessel. The micro catheter was retracted into the sigmoid artery and a completion sigmoid arteriogram was performed.  At this point, the procedure was terminated. All wires catheters and sheaths were removed from the patient. Hemostasis was achieved at the left groin access site with deployment of an Exoseal device and manual compression. A dressing was placed. The patient tolerated the procedure well without immediate postprocedural complication.  FINDINGS: Multiple superior mesenteric arteriograms were performed centering over the area of active extravasation seen on preceding nuclear medicine tagged red blood cell study, though was negative for discrete area of extravasation or vessel irregularity.  As such, multiple inferior mesenteric arteriograms were performed ultimately demonstrating an ill-defined area of active extravasation within the left lower abdomen correlating with the area of suspected bleeding seen on preceding tagged nuclear medicine red blood cell scan.  Selective sigmoid arteriogram confirmed an area of ill-defined active extravasation within the sigmoid colon of the left lower abdomen supplied by a distal sigmoid arcade vessel. This distal arcade vessel was noted to have proximal bifurcation with the caudal division supplying the area of active extravasation. Prolonged efforts were made to sub selective this caudal division however this proved difficult secondary to tortuosity of the division's origin. As such, the dominant cranial division was selected and percutaneously coil embolized with multiple  overlapping 2 mm diameter coils. Ultimately, the supplying caudal division was then able to be selected and was successfully percutaneous coil embolized with two overlapping 2 mm diameter interlock coils to the level of the supplying arcade vessel's origin.  Completion sigmoid arteriogram was negative for any residual area of active extravasation.  IMPRESSION: Technically successful percutaneous coil embolization of a distal sigmoid arcade vessel for active extravasation correlating with the area of active GI bleeding seen on preceding nuclear medicine tagged with blood cell scan.   Electronically Signed   By: Sandi Mariscal M.D.   On: 05/18/2014 09:40   Ir Angiogram Visceral Selective  05/18/2014   INDICATION: History of recent TAVR procedure, with subsequent initiation of Plavix and aspirin, now admitted with acute lower GI bleed with nuclear medicine tagged red blood cell scan compatible with acute lower GI bleed within the sigmoid colon of the  left lower abdomen.  EXAM: 1. ULTRASOUND GUIDANCE FOR ARTERIAL ACCESS 2. SUPERIOR AND INFERIOR MESENTERIC ARTERIOGRAMS 3. SUB SELECTIVE SIGMOID ARTERIOGRAM (3rd ORDER) 4. SUB SELECTIVE SIGMOID ARCADE ARTERIOGRAM (3rd ORDER) AND PERCUTANEOUS COIL EMBOLIZATION  COMPARISON:  Nuclear medicine tagged bleeding scan - 05/17/2014; CTA of the chest, abdomen pelvis - 02/21/2014  MEDICATIONS: Fentanyl 100 mcg IV; Versed 3 mg IV  CONTRAST:  161m OMNIPAQUE IOHEXOL 300 MG/ML  SOLN  FLUOROSCOPY TIME:  31 minutes 6 seconds.  ACCESS: Left common femoral artery; hemostasis achieved with the deployment of an Exoseal device and manual compression  COMPLICATIONS: None immediate  TECHNIQUE: Informed written consent was obtained from the patient and the patient's white after a discussion of the risks, benefits and alternatives to treatment. Questions regarding the procedure were encouraged and answered. A timeout was performed prior to the initiation of the procedure.  Given the presence of a  healing incision at the patient's right groin secondary to recent TAVR procedure, the left groin was selected for arterial access. As such, the left groin was prepped and draped in the usual sterile fashion, and a sterile drape was applied covering the operative field. Maximum barrier sterile technique with sterile gowns and gloves were used for the procedure. A timeout was performed prior to the initiation of the procedure. Local anesthesia was provided with 1% lidocaine.  The left femoral head was marked fluoroscopically. Under ultrasound guidance, the left common femoral artery was accessed with a micropuncture kit after the overlying soft tissues were anesthetized with 1% lidocaine. An ultrasound image was saved for documentation purposes. The micropuncture sheath was exchanged for a 5 [place] sheath over a Bentson wire. A closure arteriogram was performed through the side of the sheath confirming access within the right common femoral artery.  Over a Bentson wire, a Mickelson catheter was advanced to the level of the thoracic aorta where it was back bled and flushed. The catheter was then utilized to select the SMA and several superior mesenteric arteriograms were performed in various obliquities.  The Mickelson catheter was then utilized to select the IMA and several inferior mesenteric arteriograms were performed in various obliquities demonstrating an ill-defined area of active extravasation originating from a distal sigmoid arcade vessel compatible with the findings on preceding nuclear medicine tagged will blood cell scan.  With the use of a Synchro 14 soft tip micro wire, a regular Renegade micro catheter was advanced to the level of the sigmoid artery and a selective sigmoid arteriogram was performed. The micro catheter was then utilized to select the providing arcade vessel and a sub selective sigmoid arcade arteriogram was performed.  Prolonged efforts were made to sub select the caudal  division of this distal sigmoid arcade supplying the area of active extravasation however this initially proved unsuccessful. As such, the cranial division of this distal sigmoid arcade was percutaneously coil embolized with multiple overlapping pushable 2 mm diameter coils to the origin of the caudal tributary. With the dominant cranial division now embolized, the caudal division providing the area of active extravasation was then successfully cannulated and percutaneously coil embolized with multiple overlapping 2 mm diameter interlock coils to the level of the origin of the sigmoid arcade vessel. The micro catheter was retracted into the sigmoid artery and a completion sigmoid arteriogram was performed.  At this point, the procedure was terminated. All wires catheters and sheaths were removed from the patient. Hemostasis was achieved at the left groin access site with deployment of an Exoseal device and  manual compression. A dressing was placed. The patient tolerated the procedure well without immediate postprocedural complication.  FINDINGS: Multiple superior mesenteric arteriograms were performed centering over the area of active extravasation seen on preceding nuclear medicine tagged red blood cell study, though was negative for discrete area of extravasation or vessel irregularity.  As such, multiple inferior mesenteric arteriograms were performed ultimately demonstrating an ill-defined area of active extravasation within the left lower abdomen correlating with the area of suspected bleeding seen on preceding tagged nuclear medicine red blood cell scan.  Selective sigmoid arteriogram confirmed an area of ill-defined active extravasation within the sigmoid colon of the left lower abdomen supplied by a distal sigmoid arcade vessel. This distal arcade vessel was noted to have proximal bifurcation with the caudal division supplying the area of active extravasation. Prolonged efforts were made to sub selective  this caudal division however this proved difficult secondary to tortuosity of the division's origin. As such, the dominant cranial division was selected and percutaneously coil embolized with multiple overlapping 2 mm diameter coils. Ultimately, the supplying caudal division was then able to be selected and was successfully percutaneous coil embolized with two overlapping 2 mm diameter interlock coils to the level of the supplying arcade vessel's origin.  Completion sigmoid arteriogram was negative for any residual area of active extravasation.  IMPRESSION: Technically successful percutaneous coil embolization of a distal sigmoid arcade vessel for active extravasation correlating with the area of active GI bleeding seen on preceding nuclear medicine tagged with blood cell scan.   Electronically Signed   By: Sandi Mariscal M.D.   On: 05/18/2014 09:40   Ir Angiogram Follow Up Study  05/18/2014   INDICATION: History of recent TAVR procedure, with subsequent initiation of Plavix and aspirin, now admitted with acute lower GI bleed with nuclear medicine tagged red blood cell scan compatible with acute lower GI bleed within the sigmoid colon of the left lower abdomen.  EXAM: 1. ULTRASOUND GUIDANCE FOR ARTERIAL ACCESS 2. SUPERIOR AND INFERIOR MESENTERIC ARTERIOGRAMS 3. SUB SELECTIVE SIGMOID ARTERIOGRAM (3rd ORDER) 4. SUB SELECTIVE SIGMOID ARCADE ARTERIOGRAM (3rd ORDER) AND PERCUTANEOUS COIL EMBOLIZATION  COMPARISON:  Nuclear medicine tagged bleeding scan - 05/17/2014; CTA of the chest, abdomen pelvis - 02/21/2014  MEDICATIONS: Fentanyl 100 mcg IV; Versed 3 mg IV  CONTRAST:  141m OMNIPAQUE IOHEXOL 300 MG/ML  SOLN  FLUOROSCOPY TIME:  31 minutes 6 seconds.  ACCESS: Left common femoral artery; hemostasis achieved with the deployment of an Exoseal device and manual compression  COMPLICATIONS: None immediate  TECHNIQUE: Informed written consent was obtained from the patient and the patient's white after a discussion of the  risks, benefits and alternatives to treatment. Questions regarding the procedure were encouraged and answered. A timeout was performed prior to the initiation of the procedure.  Given the presence of a healing incision at the patient's right groin secondary to recent TAVR procedure, the left groin was selected for arterial access. As such, the left groin was prepped and draped in the usual sterile fashion, and a sterile drape was applied covering the operative field. Maximum barrier sterile technique with sterile gowns and gloves were used for the procedure. A timeout was performed prior to the initiation of the procedure. Local anesthesia was provided with 1% lidocaine.  The left femoral head was marked fluoroscopically. Under ultrasound guidance, the left common femoral artery was accessed with a micropuncture kit after the overlying soft tissues were anesthetized with 1% lidocaine. An ultrasound image was saved for documentation purposes. The micropuncture  sheath was exchanged for a 5 [place] vascular sheath over a Bentson wire. A closure arteriogram was performed through the side of the sheath confirming access within the right common femoral artery.  Over a Bentson wire, a Mickelson catheter was advanced to the level of the thoracic aorta where it was back bled and flushed. The catheter was then utilized to select the SMA and several superior mesenteric arteriograms were performed in various obliquities.  The Mickelson catheter was then utilized to select the IMA and several inferior mesenteric arteriograms were performed in various obliquities demonstrating an ill-defined area of active extravasation originating from a distal sigmoid arcade vessel compatible with the findings on preceding nuclear medicine tagged will blood cell scan.  With the use of a Synchro 14 soft tip micro wire, a regular Renegade micro catheter was advanced to the level of the sigmoid artery and a selective sigmoid arteriogram was  performed. The micro catheter was then utilized to select the providing arcade vessel and a sub selective sigmoid arcade arteriogram was performed.  Prolonged efforts were made to sub select the caudal division of this distal sigmoid arcade supplying the area of active extravasation however this initially proved unsuccessful. As such, the cranial division of this distal sigmoid arcade was percutaneously coil embolized with multiple overlapping pushable 2 mm diameter coils to the origin of the caudal tributary. With the dominant cranial division now embolized, the caudal division providing the area of active extravasation was then successfully cannulated and percutaneously coil embolized with multiple overlapping 2 mm diameter interlock coils to the level of the origin of the sigmoid arcade vessel. The micro catheter was retracted into the sigmoid artery and a completion sigmoid arteriogram was performed.  At this point, the procedure was terminated. All wires catheters and sheaths were removed from the patient. Hemostasis was achieved at the left groin access site with deployment of an Exoseal device and manual compression. A dressing was placed. The patient tolerated the procedure well without immediate postprocedural complication.  FINDINGS: Multiple superior mesenteric arteriograms were performed centering over the area of active extravasation seen on preceding nuclear medicine tagged red blood cell study, though was negative for discrete area of extravasation or vessel irregularity.  As such, multiple inferior mesenteric arteriograms were performed ultimately demonstrating an ill-defined area of active extravasation within the left lower abdomen correlating with the area of suspected bleeding seen on preceding tagged nuclear medicine red blood cell scan.  Selective sigmoid arteriogram confirmed an area of ill-defined active extravasation within the sigmoid colon of the left lower abdomen supplied by a distal  sigmoid arcade vessel. This distal arcade vessel was noted to have proximal bifurcation with the caudal division supplying the area of active extravasation. Prolonged efforts were made to sub selective this caudal division however this proved difficult secondary to tortuosity of the division's origin. As such, the dominant cranial division was selected and percutaneously coil embolized with multiple overlapping 2 mm diameter coils. Ultimately, the supplying caudal division was then able to be selected and was successfully percutaneous coil embolized with two overlapping 2 mm diameter interlock coils to the level of the supplying arcade vessel's origin.  Completion sigmoid arteriogram was negative for any residual area of active extravasation.  IMPRESSION: Technically successful percutaneous coil embolization of a distal sigmoid arcade vessel for active extravasation correlating with the area of active GI bleeding seen on preceding nuclear medicine tagged with blood cell scan.   Electronically Signed   By: Eldridge Abrahams.D.  On: 05/18/2014 09:40   Ir US Guide Vasc Access Left  05/18/2014   INDICATION: History of recent TAVR procedure, with subsequent initiation of Plavix and aspirin, now admitted with acute lower GI bleed with nuclear medicine tagged red blood cell scan compatible with acute lower GI bleed within the sigmoid colon of the left lower abdomen.  EXAM: 1. ULTRASOUND GUIDANCE FOR ARTERIAL ACCESS 2. SUPERIOR AND INFERIOR MESENTERIC ARTERIOGRAMS 3. SUB SELECTIVE SIGMOID ARTERIOGRAM (3rd ORDER) 4. SUB SELECTIVE SIGMOID ARCADE ARTERIOGRAM (3rd ORDER) AND PERCUTANEOUS COIL EMBOLIZATION  COMPARISON:  Nuclear medicine tagged bleeding scan - 05/17/2014; CTA of the chest, abdomen pelvis - 02/21/2014  MEDICATIONS: Fentanyl 100 mcg IV; Versed 3 mg IV  CONTRAST:  164m OMNIPAQUE IOHEXOL 300 MG/ML  SOLN  FLUOROSCOPY TIME:  31 minutes 6 seconds.  ACCESS: Left common femoral artery; hemostasis achieved with the  deployment of an Exoseal device and manual compression  COMPLICATIONS: None immediate  TECHNIQUE: Informed written consent was obtained from the patient and the patient's white after a discussion of the risks, benefits and alternatives to treatment. Questions regarding the procedure were encouraged and answered. A timeout was performed prior to the initiation of the procedure.  Given the presence of a healing incision at the patient's right groin secondary to recent TAVR procedure, the left groin was selected for arterial access. As such, the left groin was prepped and draped in the usual sterile fashion, and a sterile drape was applied covering the operative field. Maximum barrier sterile technique with sterile gowns and gloves were used for the procedure. A timeout was performed prior to the initiation of the procedure. Local anesthesia was provided with 1% lidocaine.  The left femoral head was marked fluoroscopically. Under ultrasound guidance, the left common femoral artery was accessed with a micropuncture kit after the overlying soft tissues were anesthetized with 1% lidocaine. An ultrasound image was saved for documentation purposes. The micropuncture sheath was exchanged for a 5 [place] sheath over a Bentson wire. A closure arteriogram was performed through the side of the sheath confirming access within the right common femoral artery.  Over a Bentson wire, a Mickelson catheter was advanced to the level of the thoracic aorta where it was back bled and flushed. The catheter was then utilized to select the SMA and several superior mesenteric arteriograms were performed in various obliquities.  The Mickelson catheter was then utilized to select the IMA and several inferior mesenteric arteriograms were performed in various obliquities demonstrating an ill-defined area of active extravasation originating from a distal sigmoid arcade vessel compatible with the findings on preceding nuclear medicine tagged  will blood cell scan.  With the use of a Synchro 14 soft tip micro wire, a regular Renegade micro catheter was advanced to the level of the sigmoid artery and a selective sigmoid arteriogram was performed. The micro catheter was then utilized to select the providing arcade vessel and a sub selective sigmoid arcade arteriogram was performed.  Prolonged efforts were made to sub select the caudal division of this distal sigmoid arcade supplying the area of active extravasation however this initially proved unsuccessful. As such, the cranial division of this distal sigmoid arcade was percutaneously coil embolized with multiple overlapping pushable 2 mm diameter coils to the origin of the caudal tributary. With the dominant cranial division now embolized, the caudal division providing the area of active extravasation was then successfully cannulated and percutaneously coil embolized with multiple overlapping 2 mm diameter interlock coils to the level of the origin  of the sigmoid arcade vessel. The micro catheter was retracted into the sigmoid artery and a completion sigmoid arteriogram was performed.  At this point, the procedure was terminated. All wires catheters and sheaths were removed from the patient. Hemostasis was achieved at the left groin access site with deployment of an Exoseal device and manual compression. A dressing was placed. The patient tolerated the procedure well without immediate postprocedural complication.  FINDINGS: Multiple superior mesenteric arteriograms were performed centering over the area of active extravasation seen on preceding nuclear medicine tagged red blood cell study, though was negative for discrete area of extravasation or vessel irregularity.  As such, multiple inferior mesenteric arteriograms were performed ultimately demonstrating an ill-defined area of active extravasation within the left lower abdomen correlating with the area of suspected bleeding seen on preceding tagged  nuclear medicine red blood cell scan.  Selective sigmoid arteriogram confirmed an area of ill-defined active extravasation within the sigmoid colon of the left lower abdomen supplied by a distal sigmoid arcade vessel. This distal arcade vessel was noted to have proximal bifurcation with the caudal division supplying the area of active extravasation. Prolonged efforts were made to sub selective this caudal division however this proved difficult secondary to tortuosity of the division's origin. As such, the dominant cranial division was selected and percutaneously coil embolized with multiple overlapping 2 mm diameter coils. Ultimately, the supplying caudal division was then able to be selected and was successfully percutaneous coil embolized with two overlapping 2 mm diameter interlock coils to the level of the supplying arcade vessel's origin.  Completion sigmoid arteriogram was negative for any residual area of active extravasation.  IMPRESSION: Technically successful percutaneous coil embolization of a distal sigmoid arcade vessel for active extravasation correlating with the area of active GI bleeding seen on preceding nuclear medicine tagged with blood cell scan.   Electronically Signed   By: Sandi Mariscal M.D.   On: 05/18/2014 09:40   Ir Embo Arterial Not [place]  05/18/2014   INDICATION: History of recent TAVR procedure, with subsequent initiation of Plavix and aspirin, now admitted with acute lower GI bleed with nuclear medicine tagged red blood cell scan compatible with acute lower GI bleed within the sigmoid colon of the left lower abdomen.  EXAM: 1. ULTRASOUND GUIDANCE FOR ARTERIAL ACCESS 2. SUPERIOR AND INFERIOR MESENTERIC ARTERIOGRAMS 3. SUB SELECTIVE SIGMOID ARTERIOGRAM (3rd ORDER) 4. SUB SELECTIVE SIGMOID ARCADE ARTERIOGRAM (3rd ORDER) AND PERCUTANEOUS COIL EMBOLIZATION  COMPARISON:  Nuclear medicine tagged bleeding scan - 05/17/2014; CTA of the chest, abdomen pelvis -  02/21/2014  MEDICATIONS: Fentanyl 100 mcg IV; Versed 3 mg IV  CONTRAST:  175m OMNIPAQUE IOHEXOL 300 MG/ML  SOLN  FLUOROSCOPY TIME:  31 minutes 6 seconds.  ACCESS: Left common femoral artery; hemostasis achieved with the deployment of an Exoseal device and manual compression  COMPLICATIONS: None immediate  TECHNIQUE: Informed written consent was obtained from the patient and the patient's white after a discussion of the risks, benefits and alternatives to treatment. Questions regarding the procedure were encouraged and answered. A timeout was performed prior to the initiation of the procedure.  Given the presence of a healing incision at the patient's right groin secondary to recent TAVR procedure, the left groin was selected for arterial access. As such, the left groin was prepped and draped in the usual sterile fashion, and a sterile drape was applied covering the operative field. Maximum barrier sterile technique with sterile gowns and gloves were used for the procedure. A  timeout was performed prior to the initiation of the procedure. Local anesthesia was provided with 1% lidocaine.  The left femoral head was marked fluoroscopically. Under ultrasound guidance, the left common femoral artery was accessed with a micropuncture kit after the overlying soft tissues were anesthetized with 1% lidocaine. An ultrasound image was saved for documentation purposes. The micropuncture sheath was exchanged for a 5 [place] vascular sheath over a Bentson wire. A closure arteriogram was performed through the side of the sheath confirming access within the right common femoral artery.  Over a Bentson wire, a Mickelson catheter was advanced to the level of the thoracic aorta where it was back bled and flushed. The catheter was then utilized to select the SMA and several superior mesenteric arteriograms were performed in various obliquities.  The Mickelson catheter was then utilized to select the IMA and several inferior mesenteric  arteriograms were performed in various obliquities demonstrating an ill-defined area of active extravasation originating from a distal sigmoid arcade vessel compatible with the findings on preceding nuclear medicine tagged will blood cell scan.  With the use of a Synchro 14 soft tip micro wire, a regular Renegade micro catheter was advanced to the level of the sigmoid artery and a selective sigmoid arteriogram was performed. The micro catheter was then utilized to select the providing arcade vessel and a sub selective sigmoid arcade arteriogram was performed.  Prolonged efforts were made to sub select the caudal division of this distal sigmoid arcade supplying the area of active extravasation however this initially proved unsuccessful. As such, the cranial division of this distal sigmoid arcade was percutaneously coil embolized with multiple overlapping pushable 2 mm diameter coils to the origin of the caudal tributary. With the dominant cranial division now embolized, the caudal division providing the area of active extravasation was then successfully cannulated and percutaneously coil embolized with multiple overlapping 2 mm diameter interlock coils to the level of the origin of the sigmoid arcade vessel. The micro catheter was retracted into the sigmoid artery and a completion sigmoid arteriogram was performed.  At this point, the procedure was terminated. All wires catheters and sheaths were removed from the patient. Hemostasis was achieved at the left groin access site with deployment of an Exoseal device and manual compression. A dressing was placed. The patient tolerated the procedure well without immediate postprocedural complication.  FINDINGS: Multiple superior mesenteric arteriograms were performed centering over the area of active extravasation seen on preceding nuclear medicine tagged red blood cell study, though was negative for discrete area of extravasation or vessel irregularity.  As such, multiple  inferior mesenteric arteriograms were performed ultimately demonstrating an ill-defined area of active extravasation within the left lower abdomen correlating with the area of suspected bleeding seen on preceding tagged nuclear medicine red blood cell scan.  Selective sigmoid arteriogram confirmed an area of ill-defined active extravasation within the sigmoid colon of the left lower abdomen supplied by a distal sigmoid arcade vessel. This distal arcade vessel was noted to have proximal bifurcation with the caudal division supplying the area of active extravasation. Prolonged efforts were made to sub selective this caudal division however this proved difficult secondary to tortuosity of the division's origin. As such, the dominant cranial division was selected and percutaneously coil embolized with multiple overlapping 2 mm diameter coils. Ultimately, the supplying caudal division was then able to be selected and was successfully percutaneous coil embolized with two overlapping 2 mm diameter interlock coils to the level of the supplying arcade vessel's origin.  Completion sigmoid arteriogram was negative for any residual area of active extravasation.  IMPRESSION: Technically successful percutaneous coil embolization of a distal sigmoid arcade vessel for active extravasation correlating with the area of active GI bleeding seen on preceding nuclear medicine tagged with blood cell scan.   Electronically Signed   By: Sandi Mariscal M.D.   On: 05/18/2014 09:40       ASSESSMENT / PLAN:  PULMONARY A:At risk pulm edema s/o Transfusion products   - maintinaing resp status P:   F/u CXR  Keep sats greater 92%, currently on RA  CARDIOVASCULAR A: Extensive cardiac h/o, TAVR, mitral valve replacement, at risk hemorrhagic shock   - Bp running a bit soft P:  Remains off plavix, asa for now, given h/o likely early restart, w/ ASA to start after 12N 7/12 and plavix to resume 7/13 (see cards consult note)  Cbc q6h;  check stat at 11am Tele MAP goal 55-60 given valvular dz   RENAL A: mild hypernatremia  P:   bmet in am  Change MIVF to 1/2 ns.  Avoid crystalloid resus, if needed, given PRBC resuscitation - kvo  GASTROINTESTINAL A:  GI bleed, s/p Embolization arcade vessel sigmoid P:   Cbc q6h Npo, defer timing of oral diet to GI  HEMATOLOGIC A:  Anemia, bleeding GI resolved, h/o thrombocytopenia 6/18 P:  scd Cbc to q6h F/u coags   INFECTIOUS A:  No evidence infection P:   Follow fever curve  TODAY'S SUMMARY:  Improved after Embolization, cbc q6h. Will see what next CBC shows. Resume ASA IF hgb stable today 7/12 and plavix 7/13  I have personally obtained a history, examined the patient, evaluated laboratory and imaging results, formulated the assessment and plan and placed orders.   05/18/2014, 7:53 AM    STAFF NOTE: I, Dr Ann Lions have personally reviewed patient's available data, including medical history, events of note, physical examination and test results as part of my evaluation. I have discussed with resident/NP and other care providers such as pharmacist, RN and RRT.  In addition,  I personally evaluated patient and elicited key findings of - lower GI bleed s/p embolization. Appears stable but borderline hypotensive that could be re-quilibration. No overt bleeding. Check cbc stat. Ok to restart aspirin but if re-bleeds stop it.  D/w Dr Collene Mares who will reassess. Updated wife and patient.  Rest per NP/medical resident whose note is outlined above and that I agree with  The patient is critically ill with multiple organ systems failure and requires high complexity decision making for assessment and support, frequent evaluation and titration of therapies, application of advanced monitoring technologies and extensive interpretation of multiple databases.   Critical Care Time devoted to patient care services described in this note is  30  Minutes.  Dr. Brand Males, M.D.,  [facility].C.P Pulmonary and Critical Care Medicine Staff Physician [facility] Pager: [phone], If no answer or between  15:00h - 7:00h: call [phone]  05/18/2014 11:12 AM

## 2014-05-18 NOTE — Progress Notes (Addendum)
Cross cover [facility] Subjective: Since I last evaluated the patient, he is doing much better. He has not had further rectal bleeding. He denies having any abdominal pain, nausea or vomiting. Dr. Pascal Lux input is greatly appreciated. Patient has a positive RBC tagged scan and had successful embolization of a vessel in the distal sigmoid arcade.  Objective: Vital signs in last 24 hours: Temp:  [97.2 F (36.2 C)-98 F (36.7 C)] 97.9 F (36.6 C) (07/12 0735) Pulse Rate:  [64-138] 104 (07/12 1100) Resp:  [11-22] 18 (07/12 1100) BP: (73-129)/(37-78) 85/59 mmHg (07/12 1100) SpO2:  [94 %-100 %] 99 % (07/12 1100) Weight:  [82.9 kg (182 lb 12.2 oz)] 82.9 kg (182 lb 12.2 oz) (07/11 1615) Last BM Date: 05/18/14  Intake/Output from previous day: 07/11 0701 - 07/12 0700 In: 2121.7 [I.V.:341.7; Blood:1280; IV Piggyback:500] Out: 350 [Urine:200; Stool:150] Intake/Output this shift: Total I/O In: 95 [I.V.:95] Out: 100 [Urine:100]  General appearance: alert, cooperative, appears older than stated age, no distress and pale Resp: clear to auscultation bilaterally Cardio: regular paced rhythm rate and rhythm GI: soft, non-tender; bowel sounds normal; no masses,  no organomegaly Extremities: extremities normal, atraumatic, no cyanosis or edema  Lab Results:  Recent Labs  05/17/14 1055  05/18/14 0845 05/18/14 1102 05/18/14 1325  WBC 5.2  --   --  12.2*  --   HGB 9.0*  < > 8.7* 9.0* 8.3*  HCT 27.6*  < > 25.8* 26.8* 24.8*  PLT 145*  --   --  106*  --   < > = values in this interval not displayed. BMET  Recent Labs  05/17/14 1055 05/18/14 0845  NA 148* 149*  K 4.2 4.1  CL 111 115*  CO2 25 18*  GLUCOSE 105* 104*  BUN 29* 31*  CREATININE 0.98 0.80  CALCIUM 8.4 7.8*   LFT  Recent Labs  05/18/14 0845  PROT 4.7*  ALBUMIN 2.6*  AST 11  ALT 7  ALKPHOS 60  BILITOT 0.7   PT/INR  Recent Labs  05/17/14 1055 05/18/14 0845  LABPROT 16.2* 15.9*  INR 1.30 1.27    Studies/Results: Nm Gi Blood Loss  05/17/2014   CLINICAL DATA:  History of recent tab are procedure, currently on Plavix and aspirin, now with 1 day history of acute rectal bleeding, anemia, hypertension  EXAM: NUCLEAR MEDICINE GASTROINTESTINAL BLEEDING SCAN  TECHNIQUE: Sequential abdominal images were obtained following intravenous administration of Tc-67mlabeled red blood cells.  RADIOPHARMACEUTICALS:  25 mCi Tc-963mn-vitro labeled red cells.  COMPARISON:  CTA of the chest, abdomen and pelvis - 02/21/2014  FINDINGS: The initial anterior projections scintigraphic image demonstrates abnormal radiotracer activity overlying the left mid hemi abdomen which persists and transits both retrograde and anterograde within the descending and more distal sigmoid colon on the acquired subsequent images.  There is homogeneous distribution of radiotracer seen within the liver, heart an intra-abdominal vascular structures.  IMPRESSION: Examination is positive acute lower GI bleed, likely originating within the sigmoid colon of the left lower abdomen.  Above findings were discussed with Dr. MaCollene Marest the time of procedure completion and the patient proceeded to the interventional radiology department for a mesenteric arteriogram and embolization.   Electronically Signed   By: JoSandi Mariscal.D.   On: 05/17/2014 22:56   Ir Angiogram Visceral Selective  05/18/2014   INDICATION: History of recent TAVR procedure, with subsequent initiation of Plavix and aspirin, now admitted with acute lower GI bleed with nuclear medicine tagged red blood cell scan  compatible with acute lower GI bleed within the sigmoid colon of the left lower abdomen.  EXAM: 1. ULTRASOUND GUIDANCE FOR ARTERIAL ACCESS 2. SUPERIOR AND INFERIOR MESENTERIC ARTERIOGRAMS 3. SUB SELECTIVE SIGMOID ARTERIOGRAM (3rd ORDER) 4. SUB SELECTIVE SIGMOID ARCADE ARTERIOGRAM (3rd ORDER) AND PERCUTANEOUS COIL EMBOLIZATION  COMPARISON:  Nuclear medicine tagged bleeding scan -  05/17/2014; CTA of the chest, abdomen pelvis - 02/21/2014  MEDICATIONS: Fentanyl 100 mcg IV; Versed 3 mg IV  CONTRAST:  153m OMNIPAQUE IOHEXOL 300 MG/ML  SOLN  FLUOROSCOPY TIME:  31 minutes 6 seconds.  ACCESS: Left common femoral artery; hemostasis achieved with the deployment of an Exoseal device and manual compression  COMPLICATIONS: None immediate  TECHNIQUE: Informed written consent was obtained from the patient and the patient's white after a discussion of the risks, benefits and alternatives to treatment. Questions regarding the procedure were encouraged and answered. A timeout was performed prior to the initiation of the procedure.  Given the presence of a healing incision at the patient's right groin secondary to recent TAVR procedure, the left groin was selected for arterial access. As such, the left groin was prepped and draped in the usual sterile fashion, and a sterile drape was applied covering the operative field. Maximum barrier sterile technique with sterile gowns and gloves were used for the procedure. A timeout was performed prior to the initiation of the procedure. Local anesthesia was provided with 1% lidocaine.  The left femoral head was marked fluoroscopically. Under ultrasound guidance, the left common femoral artery was accessed with a micropuncture kit after the overlying soft tissues were anesthetized with 1% lidocaine. An ultrasound image was saved for documentation purposes. The micropuncture sheath was exchanged for a 5 [place] sheath over a Bentson wire. A closure arteriogram was performed through the side of the sheath confirming access within the right common femoral artery.  Over a Bentson wire, a Mickelson catheter was advanced to the level of the thoracic aorta where it was back bled and flushed. The catheter was then utilized to select the SMA and several superior mesenteric arteriograms were performed in various obliquities.  The Mickelson catheter was then utilized to  select the IMA and several inferior mesenteric arteriograms were performed in various obliquities demonstrating an ill-defined area of active extravasation originating from a distal sigmoid arcade vessel compatible with the findings on preceding nuclear medicine tagged will blood cell scan.  With the use of a Synchro 14 soft tip micro wire, a regular Renegade micro catheter was advanced to the level of the sigmoid artery and a selective sigmoid arteriogram was performed. The micro catheter was then utilized to select the providing arcade vessel and a sub selective sigmoid arcade arteriogram was performed.  Prolonged efforts were made to sub select the caudal division of this distal sigmoid arcade supplying the area of active extravasation however this initially proved unsuccessful. As such, the cranial division of this distal sigmoid arcade was percutaneously coil embolized with multiple overlapping pushable 2 mm diameter coils to the origin of the caudal tributary. With the dominant cranial division now embolized, the caudal division providing the area of active extravasation was then successfully cannulated and percutaneously coil embolized with multiple overlapping 2 mm diameter interlock coils to the level of the origin of the sigmoid arcade vessel. The micro catheter was retracted into the sigmoid artery and a completion sigmoid arteriogram was performed.  At this point, the procedure was terminated. All wires catheters and sheaths were removed from the patient. Hemostasis was achieved at  the left groin access site with deployment of an Exoseal device and manual compression. A dressing was placed. The patient tolerated the procedure well without immediate postprocedural complication.  FINDINGS: Multiple superior mesenteric arteriograms were performed centering over the area of active extravasation seen on preceding nuclear medicine tagged red blood cell study, though was negative for discrete area of  extravasation or vessel irregularity.  As such, multiple inferior mesenteric arteriograms were performed ultimately demonstrating an ill-defined area of active extravasation within the left lower abdomen correlating with the area of suspected bleeding seen on preceding tagged nuclear medicine red blood cell scan.  Selective sigmoid arteriogram confirmed an area of ill-defined active extravasation within the sigmoid colon of the left lower abdomen supplied by a distal sigmoid arcade vessel. This distal arcade vessel was noted to have proximal bifurcation with the caudal division supplying the area of active extravasation. Prolonged efforts were made to sub selective this caudal division however this proved difficult secondary to tortuosity of the division's origin. As such, the dominant cranial division was selected and percutaneously coil embolized with multiple overlapping 2 mm diameter coils. Ultimately, the supplying caudal division was then able to be selected and was successfully percutaneous coil embolized with two overlapping 2 mm diameter interlock coils to the level of the supplying arcade vessel's origin.  Completion sigmoid arteriogram was negative for any residual area of active extravasation.  IMPRESSION: Technically successful percutaneous coil embolization of a distal sigmoid arcade vessel for active extravasation correlating with the area of active GI bleeding seen on preceding nuclear medicine tagged with blood cell scan.   Electronically Signed   By: Sandi Mariscal M.D.   On: 05/18/2014 09:40   Ir Angiogram Visceral Selective  05/18/2014   INDICATION: History of recent TAVR procedure, with subsequent initiation of Plavix and aspirin, now admitted with acute lower GI bleed with nuclear medicine tagged red blood cell scan compatible with acute lower GI bleed within the sigmoid colon of the left lower abdomen.  EXAM: 1. ULTRASOUND GUIDANCE FOR ARTERIAL ACCESS 2. SUPERIOR AND INFERIOR MESENTERIC  ARTERIOGRAMS 3. SUB SELECTIVE SIGMOID ARTERIOGRAM (3rd ORDER) 4. SUB SELECTIVE SIGMOID ARCADE ARTERIOGRAM (3rd ORDER) AND PERCUTANEOUS COIL EMBOLIZATION  COMPARISON:  Nuclear medicine tagged bleeding scan - 05/17/2014; CTA of the chest, abdomen pelvis - 02/21/2014  MEDICATIONS: Fentanyl 100 mcg IV; Versed 3 mg IV  CONTRAST:  167m OMNIPAQUE IOHEXOL 300 MG/ML  SOLN  FLUOROSCOPY TIME:  31 minutes 6 seconds.  ACCESS: Left common femoral artery; hemostasis achieved with the deployment of an Exoseal device and manual compression  COMPLICATIONS: None immediate  TECHNIQUE: Informed written consent was obtained from the patient and the patient's white after a discussion of the risks, benefits and alternatives to treatment. Questions regarding the procedure were encouraged and answered. A timeout was performed prior to the initiation of the procedure.  Given the presence of a healing incision at the patient's right groin secondary to recent TAVR procedure, the left groin was selected for arterial access. As such, the left groin was prepped and draped in the usual sterile fashion, and a sterile drape was applied covering the operative field. Maximum barrier sterile technique with sterile gowns and gloves were used for the procedure. A timeout was performed prior to the initiation of the procedure. Local anesthesia was provided with 1% lidocaine.  The left femoral head was marked fluoroscopically. Under ultrasound guidance, the left common femoral artery was accessed with a micropuncture kit after the overlying soft tissues were anesthetized with 1%  lidocaine. An ultrasound image was saved for documentation purposes. The micropuncture sheath was exchanged for a 5 [place] vascular sheath over a Bentson wire. A closure arteriogram was performed through the side of the sheath confirming access within the right common femoral artery.  Over a Bentson wire, a Mickelson catheter was advanced to the level of the thoracic aorta where it  was back bled and flushed. The catheter was then utilized to select the SMA and several superior mesenteric arteriograms were performed in various obliquities.  The Mickelson catheter was then utilized to select the IMA and several inferior mesenteric arteriograms were performed in various obliquities demonstrating an ill-defined area of active extravasation originating from a distal sigmoid arcade vessel compatible with the findings on preceding nuclear medicine tagged will blood cell scan.  With the use of a Synchro 14 soft tip micro wire, a regular Renegade micro catheter was advanced to the level of the sigmoid artery and a selective sigmoid arteriogram was performed. The micro catheter was then utilized to select the providing arcade vessel and a sub selective sigmoid arcade arteriogram was performed.  Prolonged efforts were made to sub select the caudal division of this distal sigmoid arcade supplying the area of active extravasation however this initially proved unsuccessful. As such, the cranial division of this distal sigmoid arcade was percutaneously coil embolized with multiple overlapping pushable 2 mm diameter coils to the origin of the caudal tributary. With the dominant cranial division now embolized, the caudal division providing the area of active extravasation was then successfully cannulated and percutaneously coil embolized with multiple overlapping 2 mm diameter interlock coils to the level of the origin of the sigmoid arcade vessel. The micro catheter was retracted into the sigmoid artery and a completion sigmoid arteriogram was performed.  At this point, the procedure was terminated. All wires catheters and sheaths were removed from the patient. Hemostasis was achieved at the left groin access site with deployment of an Exoseal device and manual compression. A dressing was placed. The patient tolerated the procedure well without immediate postprocedural complication.  FINDINGS: Multiple  superior mesenteric arteriograms were performed centering over the area of active extravasation seen on preceding nuclear medicine tagged red blood cell study, though was negative for discrete area of extravasation or vessel irregularity.  As such, multiple inferior mesenteric arteriograms were performed ultimately demonstrating an ill-defined area of active extravasation within the left lower abdomen correlating with the area of suspected bleeding seen on preceding tagged nuclear medicine red blood cell scan.  Selective sigmoid arteriogram confirmed an area of ill-defined active extravasation within the sigmoid colon of the left lower abdomen supplied by a distal sigmoid arcade vessel. This distal arcade vessel was noted to have proximal bifurcation with the caudal division supplying the area of active extravasation. Prolonged efforts were made to sub selective this caudal division however this proved difficult secondary to tortuosity of the division's origin. As such, the dominant cranial division was selected and percutaneously coil embolized with multiple overlapping 2 mm diameter coils. Ultimately, the supplying caudal division was then able to be selected and was successfully percutaneous coil embolized with two overlapping 2 mm diameter interlock coils to the level of the supplying arcade vessel's origin.  Completion sigmoid arteriogram was negative for any residual area of active extravasation.  IMPRESSION: Technically successful percutaneous coil embolization of a distal sigmoid arcade vessel for active extravasation correlating with the area of active GI bleeding seen on preceding nuclear medicine tagged with blood cell scan.  Electronically Signed   By: Sandi Mariscal M.D.   On: 05/18/2014 09:40   Ir Angiogram Follow Up Study  05/18/2014   INDICATION: History of recent TAVR procedure, with subsequent initiation of Plavix and aspirin, now admitted with acute lower GI bleed with nuclear medicine tagged red  blood cell scan compatible with acute lower GI bleed within the sigmoid colon of the left lower abdomen.  EXAM: 1. ULTRASOUND GUIDANCE FOR ARTERIAL ACCESS 2. SUPERIOR AND INFERIOR MESENTERIC ARTERIOGRAMS 3. SUB SELECTIVE SIGMOID ARTERIOGRAM (3rd ORDER) 4. SUB SELECTIVE SIGMOID ARCADE ARTERIOGRAM (3rd ORDER) AND PERCUTANEOUS COIL EMBOLIZATION  COMPARISON:  Nuclear medicine tagged bleeding scan - 05/17/2014; CTA of the chest, abdomen pelvis - 02/21/2014  MEDICATIONS: Fentanyl 100 mcg IV; Versed 3 mg IV  CONTRAST:  148m OMNIPAQUE IOHEXOL 300 MG/ML  SOLN  FLUOROSCOPY TIME:  31 minutes 6 seconds.  ACCESS: Left common femoral artery; hemostasis achieved with the deployment of an Exoseal device and manual compression  COMPLICATIONS: None immediate  TECHNIQUE: Informed written consent was obtained from the patient and the patient's white after a discussion of the risks, benefits and alternatives to treatment. Questions regarding the procedure were encouraged and answered. A timeout was performed prior to the initiation of the procedure.  Given the presence of a healing incision at the patient's right groin secondary to recent TAVR procedure, the left groin was selected for arterial access. As such, the left groin was prepped and draped in the usual sterile fashion, and a sterile drape was applied covering the operative field. Maximum barrier sterile technique with sterile gowns and gloves were used for the procedure. A timeout was performed prior to the initiation of the procedure. Local anesthesia was provided with 1% lidocaine.  The left femoral head was marked fluoroscopically. Under ultrasound guidance, the left common femoral artery was accessed with a micropuncture kit after the overlying soft tissues were anesthetized with 1% lidocaine. An ultrasound image was saved for documentation purposes. The micropuncture sheath was exchanged for a 5 [place] sheath over a Bentson wire. A closure arteriogram was  performed through the side of the sheath confirming access within the right common femoral artery.  Over a Bentson wire, a Mickelson catheter was advanced to the level of the thoracic aorta where it was back bled and flushed. The catheter was then utilized to select the SMA and several superior mesenteric arteriograms were performed in various obliquities.  The Mickelson catheter was then utilized to select the IMA and several inferior mesenteric arteriograms were performed in various obliquities demonstrating an ill-defined area of active extravasation originating from a distal sigmoid arcade vessel compatible with the findings on preceding nuclear medicine tagged will blood cell scan.  With the use of a Synchro 14 soft tip micro wire, a regular Renegade micro catheter was advanced to the level of the sigmoid artery and a selective sigmoid arteriogram was performed. The micro catheter was then utilized to select the providing arcade vessel and a sub selective sigmoid arcade arteriogram was performed.  Prolonged efforts were made to sub select the caudal division of this distal sigmoid arcade supplying the area of active extravasation however this initially proved unsuccessful. As such, the cranial division of this distal sigmoid arcade was percutaneously coil embolized with multiple overlapping pushable 2 mm diameter coils to the origin of the caudal tributary. With the dominant cranial division now embolized, the caudal division providing the area of active extravasation was then successfully cannulated and percutaneously coil embolized with multiple overlapping 2  mm diameter interlock coils to the level of the origin of the sigmoid arcade vessel. The micro catheter was retracted into the sigmoid artery and a completion sigmoid arteriogram was performed.  At this point, the procedure was terminated. All wires catheters and sheaths were removed from the patient. Hemostasis was achieved at the left groin access site  with deployment of an Exoseal device and manual compression. A dressing was placed. The patient tolerated the procedure well without immediate postprocedural complication.  FINDINGS: Multiple superior mesenteric arteriograms were performed centering over the area of active extravasation seen on preceding nuclear medicine tagged red blood cell study, though was negative for discrete area of extravasation or vessel irregularity.  As such, multiple inferior mesenteric arteriograms were performed ultimately demonstrating an ill-defined area of active extravasation within the left lower abdomen correlating with the area of suspected bleeding seen on preceding tagged nuclear medicine red blood cell scan.  Selective sigmoid arteriogram confirmed an area of ill-defined active extravasation within the sigmoid colon of the left lower abdomen supplied by a distal sigmoid arcade vessel. This distal arcade vessel was noted to have proximal bifurcation with the caudal division supplying the area of active extravasation. Prolonged efforts were made to sub selective this caudal division however this proved difficult secondary to tortuosity of the division's origin. As such, the dominant cranial division was selected and percutaneously coil embolized with multiple overlapping 2 mm diameter coils. Ultimately, the supplying caudal division was then able to be selected and was successfully percutaneous coil embolized with two overlapping 2 mm diameter interlock coils to the level of the supplying arcade vessel's origin.  Completion sigmoid arteriogram was negative for any residual area of active extravasation.  IMPRESSION: Technically successful percutaneous coil embolization of a distal sigmoid arcade vessel for active extravasation correlating with the area of active GI bleeding seen on preceding nuclear medicine tagged with blood cell scan.   Electronically Signed   By: Sandi Mariscal M.D.   On: 05/18/2014 09:40   Ir US Guide Vasc  Access Left  05/18/2014   INDICATION: History of recent TAVR procedure, with subsequent initiation of Plavix and aspirin, now admitted with acute lower GI bleed with nuclear medicine tagged red blood cell scan compatible with acute lower GI bleed within the sigmoid colon of the left lower abdomen.  EXAM: 1. ULTRASOUND GUIDANCE FOR ARTERIAL ACCESS 2. SUPERIOR AND INFERIOR MESENTERIC ARTERIOGRAMS 3. SUB SELECTIVE SIGMOID ARTERIOGRAM (3rd ORDER) 4. SUB SELECTIVE SIGMOID ARCADE ARTERIOGRAM (3rd ORDER) AND PERCUTANEOUS COIL EMBOLIZATION  COMPARISON:  Nuclear medicine tagged bleeding scan - 05/17/2014; CTA of the chest, abdomen pelvis - 02/21/2014  MEDICATIONS: Fentanyl 100 mcg IV; Versed 3 mg IV  CONTRAST:  161m OMNIPAQUE IOHEXOL 300 MG/ML  SOLN  FLUOROSCOPY TIME:  31 minutes 6 seconds.  ACCESS: Left common femoral artery; hemostasis achieved with the deployment of an Exoseal device and manual compression  COMPLICATIONS: None immediate  TECHNIQUE: Informed written consent was obtained from the patient and the patient's white after a discussion of the risks, benefits and alternatives to treatment. Questions regarding the procedure were encouraged and answered. A timeout was performed prior to the initiation of the procedure.  Given the presence of a healing incision at the patient's right groin secondary to recent TAVR procedure, the left groin was selected for arterial access. As such, the left groin was prepped and draped in the usual sterile fashion, and a sterile drape was applied covering the operative field. Maximum barrier sterile technique with sterile gowns and  gloves were used for the procedure. A timeout was performed prior to the initiation of the procedure. Local anesthesia was provided with 1% lidocaine.  The left femoral head was marked fluoroscopically. Under ultrasound guidance, the left common femoral artery was accessed with a micropuncture kit after the overlying soft tissues were anesthetized with  1% lidocaine. An ultrasound image was saved for documentation purposes. The micropuncture sheath was exchanged for a 5 [place] vascular sheath over a Bentson wire. A closure arteriogram was performed through the side of the sheath confirming access within the right common femoral artery.  Over a Bentson wire, a Mickelson catheter was advanced to the level of the thoracic aorta where it was back bled and flushed. The catheter was then utilized to select the SMA and several superior mesenteric arteriograms were performed in various obliquities.  The Mickelson catheter was then utilized to select the IMA and several inferior mesenteric arteriograms were performed in various obliquities demonstrating an ill-defined area of active extravasation originating from a distal sigmoid arcade vessel compatible with the findings on preceding nuclear medicine tagged will blood cell scan.  With the use of a Synchro 14 soft tip micro wire, a regular Renegade micro catheter was advanced to the level of the sigmoid artery and a selective sigmoid arteriogram was performed. The micro catheter was then utilized to select the providing arcade vessel and a sub selective sigmoid arcade arteriogram was performed.  Prolonged efforts were made to sub select the caudal division of this distal sigmoid arcade supplying the area of active extravasation however this initially proved unsuccessful. As such, the cranial division of this distal sigmoid arcade was percutaneously coil embolized with multiple overlapping pushable 2 mm diameter coils to the origin of the caudal tributary. With the dominant cranial division now embolized, the caudal division providing the area of active extravasation was then successfully cannulated and percutaneously coil embolized with multiple overlapping 2 mm diameter interlock coils to the level of the origin of the sigmoid arcade vessel. The micro catheter was retracted into the sigmoid artery and a completion sigmoid  arteriogram was performed.  At this point, the procedure was terminated. All wires catheters and sheaths were removed from the patient. Hemostasis was achieved at the left groin access site with deployment of an Exoseal device and manual compression. A dressing was placed. The patient tolerated the procedure well without immediate postprocedural complication.  FINDINGS: Multiple superior mesenteric arteriograms were performed centering over the area of active extravasation seen on preceding nuclear medicine tagged red blood cell study, though was negative for discrete area of extravasation or vessel irregularity.  As such, multiple inferior mesenteric arteriograms were performed ultimately demonstrating an ill-defined area of active extravasation within the left lower abdomen correlating with the area of suspected bleeding seen on preceding tagged nuclear medicine red blood cell scan.  Selective sigmoid arteriogram confirmed an area of ill-defined active extravasation within the sigmoid colon of the left lower abdomen supplied by a distal sigmoid arcade vessel. This distal arcade vessel was noted to have proximal bifurcation with the caudal division supplying the area of active extravasation. Prolonged efforts were made to sub selective this caudal division however this proved difficult secondary to tortuosity of the division's origin. As such, the dominant cranial division was selected and percutaneously coil embolized with multiple overlapping 2 mm diameter coils. Ultimately, the supplying caudal division was then able to be selected and was successfully percutaneous coil embolized with two overlapping 2 mm diameter interlock coils to the level  of the supplying arcade vessel's origin.  Completion sigmoid arteriogram was negative for any residual area of active extravasation.  IMPRESSION: Technically successful percutaneous coil embolization of a distal sigmoid arcade vessel for active extravasation correlating  with the area of active GI bleeding seen on preceding nuclear medicine tagged with blood cell scan.   Electronically Signed   By: Sandi Mariscal M.D.   On: 05/18/2014 09:40   Ir Embo Arterial Not [place]  05/18/2014   INDICATION: History of recent TAVR procedure, with subsequent initiation of Plavix and aspirin, now admitted with acute lower GI bleed with nuclear medicine tagged red blood cell scan compatible with acute lower GI bleed within the sigmoid colon of the left lower abdomen.  EXAM: 1. ULTRASOUND GUIDANCE FOR ARTERIAL ACCESS 2. SUPERIOR AND INFERIOR MESENTERIC ARTERIOGRAMS 3. SUB SELECTIVE SIGMOID ARTERIOGRAM (3rd ORDER) 4. SUB SELECTIVE SIGMOID ARCADE ARTERIOGRAM (3rd ORDER) AND PERCUTANEOUS COIL EMBOLIZATION  COMPARISON:  Nuclear medicine tagged bleeding scan - 05/17/2014; CTA of the chest, abdomen pelvis - 02/21/2014  MEDICATIONS: Fentanyl 100 mcg IV; Versed 3 mg IV  CONTRAST:  193m OMNIPAQUE IOHEXOL 300 MG/ML  SOLN  FLUOROSCOPY TIME:  31 minutes 6 seconds.  ACCESS: Left common femoral artery; hemostasis achieved with the deployment of an Exoseal device and manual compression  COMPLICATIONS: None immediate  TECHNIQUE: Informed written consent was obtained from the patient and the patient's white after a discussion of the risks, benefits and alternatives to treatment. Questions regarding the procedure were encouraged and answered. A timeout was performed prior to the initiation of the procedure.  Given the presence of a healing incision at the patient's right groin secondary to recent TAVR procedure, the left groin was selected for arterial access. As such, the left groin was prepped and draped in the usual sterile fashion, and a sterile drape was applied covering the operative field. Maximum barrier sterile technique with sterile gowns and gloves were used for the procedure. A timeout was performed prior to the initiation of the procedure. Local anesthesia was provided with  1% lidocaine.  The left femoral head was marked fluoroscopically. Under ultrasound guidance, the left common femoral artery was accessed with a micropuncture kit after the overlying soft tissues were anesthetized with 1% lidocaine. An ultrasound image was saved for documentation purposes. The micropuncture sheath was exchanged for a 5 [place] sheath over a Bentson wire. A closure arteriogram was performed through the side of the sheath confirming access within the right common femoral artery.  Over a Bentson wire, a Mickelson catheter was advanced to the level of the thoracic aorta where it was back bled and flushed. The catheter was then utilized to select the SMA and several superior mesenteric arteriograms were performed in various obliquities.  The Mickelson catheter was then utilized to select the IMA and several inferior mesenteric arteriograms were performed in various obliquities demonstrating an ill-defined area of active extravasation originating from a distal sigmoid arcade vessel compatible with the findings on preceding nuclear medicine tagged will blood cell scan.  With the use of a Synchro 14 soft tip micro wire, a regular Renegade micro catheter was advanced to the level of the sigmoid artery and a selective sigmoid arteriogram was performed. The micro catheter was then utilized to select the providing arcade vessel and a sub selective sigmoid arcade arteriogram was performed.  Prolonged efforts were made to sub select the caudal division of this distal sigmoid arcade supplying the area of active extravasation however this initially  proved unsuccessful. As such, the cranial division of this distal sigmoid arcade was percutaneously coil embolized with multiple overlapping pushable 2 mm diameter coils to the origin of the caudal tributary. With the dominant cranial division now embolized, the caudal division providing the area of active extravasation was then successfully cannulated and  percutaneously coil embolized with multiple overlapping 2 mm diameter interlock coils to the level of the origin of the sigmoid arcade vessel. The micro catheter was retracted into the sigmoid artery and a completion sigmoid arteriogram was performed.  At this point, the procedure was terminated. All wires catheters and sheaths were removed from the patient. Hemostasis was achieved at the left groin access site with deployment of an Exoseal device and manual compression. A dressing was placed. The patient tolerated the procedure well without immediate postprocedural complication.  FINDINGS: Multiple superior mesenteric arteriograms were performed centering over the area of active extravasation seen on preceding nuclear medicine tagged red blood cell study, though was negative for discrete area of extravasation or vessel irregularity.  As such, multiple inferior mesenteric arteriograms were performed ultimately demonstrating an ill-defined area of active extravasation within the left lower abdomen correlating with the area of suspected bleeding seen on preceding tagged nuclear medicine red blood cell scan.  Selective sigmoid arteriogram confirmed an area of ill-defined active extravasation within the sigmoid colon of the left lower abdomen supplied by a distal sigmoid arcade vessel. This distal arcade vessel was noted to have proximal bifurcation with the caudal division supplying the area of active extravasation. Prolonged efforts were made to sub selective this caudal division however this proved difficult secondary to tortuosity of the division's origin. As such, the dominant cranial division was selected and percutaneously coil embolized with multiple overlapping 2 mm diameter coils. Ultimately, the supplying caudal division was then able to be selected and was successfully percutaneous coil embolized with two overlapping 2 mm diameter interlock coils to the level of the supplying arcade vessel's origin.   Completion sigmoid arteriogram was negative for any residual area of active extravasation.  IMPRESSION: Technically successful percutaneous coil embolization of a distal sigmoid arcade vessel for active extravasation correlating with the area of active GI bleeding seen on preceding nuclear medicine tagged with blood cell scan.   Electronically Signed   By: Sandi Mariscal M.D.   On: 05/18/2014 09:40   Medications: I have reviewed the patient's current medications.  Assessment/Plan: Anemia s/p rectal bleeding presumed to be from a diverticular bleed. Continue close monitoring of his blood counts,he has already been started on his Aspirin. His BP is still not in the normal range. I have advised the patient's nurse to be sure there is no evidence of rectal bleeding before the Plavix is given to him again. We will advance his diet slowly.  LOS: 1 day   Jezelle Gullick 05/18/2014, 2:03 PM

## 2014-05-18 NOTE — Progress Notes (Signed)
PT in VTACH< Dr Jacki Cones called to bedside, reviewed ekg, states narrow complex, not VTACh, ordered Phos and Mag from previous lab draw.

## 2014-05-18 NOTE — Progress Notes (Signed)
S/P two PIV attempts, IV contacted and IV start requested.

## 2014-05-18 NOTE — Progress Notes (Signed)
Pt has had two runs on non-sustained VTACH and one run of VTACH, pt is v-paced, and asymptomatic, reviewed findings with Marni Griffon, NP.Marland Kitchenper NP continue to watch and call with any new findings, no interventions at this time.

## 2014-05-18 NOTE — Progress Notes (Addendum)
[facility] [facility] Progress Note Patient Name: [name] [name] DOB: Jan 28, 1929 MRN: [id number]  Date of Service  05/18/2014   HPI/Events of Note   H/H not dropping after arterial embolization.   eICU Interventions  Will DC q 6 hr CBCs   Intervention Category Minor Interventions: Routine modifications to care plan (e.g. PRN medications for pain, fever)  Merton Border 05/18/2014, 11:28 PM

## 2014-05-18 NOTE — Progress Notes (Signed)
Subjective: Pt awake/alert; denies abd pain,N/V; no further sig bleeding reported  Objective: Vital signs in last 24 hours: Temp:  [97.2 F (36.2 C)-98 F (36.7 C)] 97.9 F (36.6 C) (07/12 0735) Pulse Rate:  [64-138] 105 (07/12 1000) Resp:  [11-23] 16 (07/12 1000) BP: (73-129)/(37-80) 86/49 mmHg (07/12 1000) SpO2:  [94 %-100 %] 96 % (07/12 1000) Weight:  [182 lb 12.2 oz (82.9 kg)] 182 lb 12.2 oz (82.9 kg) (07/11 1615) Last BM Date: 05/18/14  Intake/Output from previous day: 07/11 0701 - 07/12 0700 In: 2121.7 [I.V.:341.7; Blood:1280; IV Piggyback:500] Out: 350 [Urine:200; Stool:150] Intake/Output this shift: Total I/O In: 95 [I.V.:95] Out: -   Left CFA puncture site clean and dry, soft, NT, no hematoma, 1+ distal pulses ; abd soft,+BS, NT  Lab Results:   Recent Labs  05/17/14 1055  05/18/14 0155 05/18/14 0845  WBC 5.2  --   --   --   HGB 9.0*  < > 8.2* 8.7*  HCT 27.6*  < > 24.5* 25.8*  PLT 145*  --   --   --   < > = values in this interval not displayed. BMET  Recent Labs  05/17/14 1055 05/18/14 0845  NA 148* 149*  K 4.2 4.1  CL 111 115*  CO2 25 18*  GLUCOSE 105* 104*  BUN 29* 31*  CREATININE 0.98 0.80  CALCIUM 8.4 7.8*   PT/INR  Recent Labs  05/17/14 1055 05/18/14 0845  LABPROT 16.2* 15.9*  INR 1.30 1.27   ABG No results found for this basename: PHART, PCO2, PO2, HCO3,  in the last 72 hours  Studies/Results: Nm Gi Blood Loss  05/17/2014   CLINICAL DATA:  History of recent tab are procedure, currently on Plavix and aspirin, now with 1 day history of acute rectal bleeding, anemia, hypertension  EXAM: NUCLEAR MEDICINE GASTROINTESTINAL BLEEDING SCAN  TECHNIQUE: Sequential abdominal images were obtained following intravenous administration of Tc-40mlabeled red blood cells.  RADIOPHARMACEUTICALS:  25 mCi Tc-99mn-vitro labeled red cells.  COMPARISON:  CTA of the chest, abdomen and pelvis - 02/21/2014  FINDINGS: The initial anterior projections  scintigraphic image demonstrates abnormal radiotracer activity overlying the left mid hemi abdomen which persists and transits both retrograde and anterograde within the descending and more distal sigmoid colon on the acquired subsequent images.  There is homogeneous distribution of radiotracer seen within the liver, heart an intra-abdominal vascular structures.  IMPRESSION: Examination is positive acute lower GI bleed, likely originating within the sigmoid colon of the left lower abdomen.  Above findings were discussed with Dr. MaCollene Marest the time of procedure completion and the patient proceeded to the interventional radiology department for a mesenteric arteriogram and embolization.   Electronically Signed   By: JoSandi Mariscal.D.   On: 05/17/2014 22:56   Ir Angiogram Visceral Selective  05/18/2014   INDICATION: History of recent TAVR procedure, with subsequent initiation of Plavix and aspirin, now admitted with acute lower GI bleed with nuclear medicine tagged red blood cell scan compatible with acute lower GI bleed within the sigmoid colon of the left lower abdomen.  EXAM: 1. ULTRASOUND GUIDANCE FOR ARTERIAL ACCESS 2. SUPERIOR AND INFERIOR MESENTERIC ARTERIOGRAMS 3. SUB SELECTIVE SIGMOID ARTERIOGRAM (3rd ORDER) 4. SUB SELECTIVE SIGMOID ARCADE ARTERIOGRAM (3rd ORDER) AND PERCUTANEOUS COIL EMBOLIZATION  COMPARISON:  Nuclear medicine tagged bleeding scan - 05/17/2014; CTA of the chest, abdomen pelvis - 02/21/2014  MEDICATIONS: Fentanyl 100 mcg IV; Versed 3 mg IV  CONTRAST:  10024mMNIPAQUE IOHEXOL 300 MG/ML  SOLN  FLUOROSCOPY TIME:  31 minutes 6 seconds.  ACCESS: Left common femoral artery; hemostasis achieved with the deployment of an Exoseal device and manual compression  COMPLICATIONS: None immediate  TECHNIQUE: Informed written consent was obtained from the patient and the patient's white after a discussion of the risks, benefits and alternatives to treatment. Questions regarding the procedure were encouraged  and answered. A timeout was performed prior to the initiation of the procedure.  Given the presence of a healing incision at the patient's right groin secondary to recent TAVR procedure, the left groin was selected for arterial access. As such, the left groin was prepped and draped in the usual sterile fashion, and a sterile drape was applied covering the operative field. Maximum barrier sterile technique with sterile gowns and gloves were used for the procedure. A timeout was performed prior to the initiation of the procedure. Local anesthesia was provided with 1% lidocaine.  The left femoral head was marked fluoroscopically. Under ultrasound guidance, the left common femoral artery was accessed with a micropuncture kit after the overlying soft tissues were anesthetized with 1% lidocaine. An ultrasound image was saved for documentation purposes. The micropuncture sheath was exchanged for a 5 [place] vascular sheath over a Bentson wire. A closure arteriogram was performed through the side of the sheath confirming access within the right common femoral artery.  Over a Bentson wire, a Mickelson catheter was advanced to the level of the thoracic aorta where it was back bled and flushed. The catheter was then utilized to select the SMA and several superior mesenteric arteriograms were performed in various obliquities.  The Mickelson catheter was then utilized to select the IMA and several inferior mesenteric arteriograms were performed in various obliquities demonstrating an ill-defined area of active extravasation originating from a distal sigmoid arcade vessel compatible with the findings on preceding nuclear medicine tagged will blood cell scan.  With the use of a Synchro 14 soft tip micro wire, a regular Renegade micro catheter was advanced to the level of the sigmoid artery and a selective sigmoid arteriogram was performed. The micro catheter was then utilized to select the providing arcade vessel and a sub  selective sigmoid arcade arteriogram was performed.  Prolonged efforts were made to sub select the caudal division of this distal sigmoid arcade supplying the area of active extravasation however this initially proved unsuccessful. As such, the cranial division of this distal sigmoid arcade was percutaneously coil embolized with multiple overlapping pushable 2 mm diameter coils to the origin of the caudal tributary. With the dominant cranial division now embolized, the caudal division providing the area of active extravasation was then successfully cannulated and percutaneously coil embolized with multiple overlapping 2 mm diameter interlock coils to the level of the origin of the sigmoid arcade vessel. The micro catheter was retracted into the sigmoid artery and a completion sigmoid arteriogram was performed.  At this point, the procedure was terminated. All wires catheters and sheaths were removed from the patient. Hemostasis was achieved at the left groin access site with deployment of an Exoseal device and manual compression. A dressing was placed. The patient tolerated the procedure well without immediate postprocedural complication.  FINDINGS: Multiple superior mesenteric arteriograms were performed centering over the area of active extravasation seen on preceding nuclear medicine tagged red blood cell study, though was negative for discrete area of extravasation or vessel irregularity.  As such, multiple inferior mesenteric arteriograms were performed ultimately demonstrating an ill-defined area of active extravasation within the left lower  abdomen correlating with the area of suspected bleeding seen on preceding tagged nuclear medicine red blood cell scan.  Selective sigmoid arteriogram confirmed an area of ill-defined active extravasation within the sigmoid colon of the left lower abdomen supplied by a distal sigmoid arcade vessel. This distal arcade vessel was noted to have proximal bifurcation with the  caudal division supplying the area of active extravasation. Prolonged efforts were made to sub selective this caudal division however this proved difficult secondary to tortuosity of the division's origin. As such, the dominant cranial division was selected and percutaneously coil embolized with multiple overlapping 2 mm diameter coils. Ultimately, the supplying caudal division was then able to be selected and was successfully percutaneous coil embolized with two overlapping 2 mm diameter interlock coils to the level of the supplying arcade vessel's origin.  Completion sigmoid arteriogram was negative for any residual area of active extravasation.  IMPRESSION: Technically successful percutaneous coil embolization of a distal sigmoid arcade vessel for active extravasation correlating with the area of active GI bleeding seen on preceding nuclear medicine tagged with blood cell scan.   Electronically Signed   By: Sandi Mariscal M.D.   On: 05/18/2014 09:40   Ir Angiogram Visceral Selective  05/18/2014   INDICATION: History of recent TAVR procedure, with subsequent initiation of Plavix and aspirin, now admitted with acute lower GI bleed with nuclear medicine tagged red blood cell scan compatible with acute lower GI bleed within the sigmoid colon of the left lower abdomen.  EXAM: 1. ULTRASOUND GUIDANCE FOR ARTERIAL ACCESS 2. SUPERIOR AND INFERIOR MESENTERIC ARTERIOGRAMS 3. SUB SELECTIVE SIGMOID ARTERIOGRAM (3rd ORDER) 4. SUB SELECTIVE SIGMOID ARCADE ARTERIOGRAM (3rd ORDER) AND PERCUTANEOUS COIL EMBOLIZATION  COMPARISON:  Nuclear medicine tagged bleeding scan - 05/17/2014; CTA of the chest, abdomen pelvis - 02/21/2014  MEDICATIONS: Fentanyl 100 mcg IV; Versed 3 mg IV  CONTRAST:  134m OMNIPAQUE IOHEXOL 300 MG/ML  SOLN  FLUOROSCOPY TIME:  31 minutes 6 seconds.  ACCESS: Left common femoral artery; hemostasis achieved with the deployment of an Exoseal device and manual compression  COMPLICATIONS: None immediate  TECHNIQUE:  Informed written consent was obtained from the patient and the patient's white after a discussion of the risks, benefits and alternatives to treatment. Questions regarding the procedure were encouraged and answered. A timeout was performed prior to the initiation of the procedure.  Given the presence of a healing incision at the patient's right groin secondary to recent TAVR procedure, the left groin was selected for arterial access. As such, the left groin was prepped and draped in the usual sterile fashion, and a sterile drape was applied covering the operative field. Maximum barrier sterile technique with sterile gowns and gloves were used for the procedure. A timeout was performed prior to the initiation of the procedure. Local anesthesia was provided with 1% lidocaine.  The left femoral head was marked fluoroscopically. Under ultrasound guidance, the left common femoral artery was accessed with a micropuncture kit after the overlying soft tissues were anesthetized with 1% lidocaine. An ultrasound image was saved for documentation purposes. The micropuncture sheath was exchanged for a 5 [place] sheath over a Bentson wire. A closure arteriogram was performed through the side of the sheath confirming access within the right common femoral artery.  Over a Bentson wire, a Mickelson catheter was advanced to the level of the thoracic aorta where it was back bled and flushed. The catheter was then utilized to select the SMA and several superior mesenteric arteriograms were performed in various  obliquities.  The Mickelson catheter was then utilized to select the IMA and several inferior mesenteric arteriograms were performed in various obliquities demonstrating an ill-defined area of active extravasation originating from a distal sigmoid arcade vessel compatible with the findings on preceding nuclear medicine tagged will blood cell scan.  With the use of a Synchro 14 soft tip micro wire, a regular Renegade micro  catheter was advanced to the level of the sigmoid artery and a selective sigmoid arteriogram was performed. The micro catheter was then utilized to select the providing arcade vessel and a sub selective sigmoid arcade arteriogram was performed.  Prolonged efforts were made to sub select the caudal division of this distal sigmoid arcade supplying the area of active extravasation however this initially proved unsuccessful. As such, the cranial division of this distal sigmoid arcade was percutaneously coil embolized with multiple overlapping pushable 2 mm diameter coils to the origin of the caudal tributary. With the dominant cranial division now embolized, the caudal division providing the area of active extravasation was then successfully cannulated and percutaneously coil embolized with multiple overlapping 2 mm diameter interlock coils to the level of the origin of the sigmoid arcade vessel. The micro catheter was retracted into the sigmoid artery and a completion sigmoid arteriogram was performed.  At this point, the procedure was terminated. All wires catheters and sheaths were removed from the patient. Hemostasis was achieved at the left groin access site with deployment of an Exoseal device and manual compression. A dressing was placed. The patient tolerated the procedure well without immediate postprocedural complication.  FINDINGS: Multiple superior mesenteric arteriograms were performed centering over the area of active extravasation seen on preceding nuclear medicine tagged red blood cell study, though was negative for discrete area of extravasation or vessel irregularity.  As such, multiple inferior mesenteric arteriograms were performed ultimately demonstrating an ill-defined area of active extravasation within the left lower abdomen correlating with the area of suspected bleeding seen on preceding tagged nuclear medicine red blood cell scan.  Selective sigmoid arteriogram confirmed an area of ill-defined  active extravasation within the sigmoid colon of the left lower abdomen supplied by a distal sigmoid arcade vessel. This distal arcade vessel was noted to have proximal bifurcation with the caudal division supplying the area of active extravasation. Prolonged efforts were made to sub selective this caudal division however this proved difficult secondary to tortuosity of the division's origin. As such, the dominant cranial division was selected and percutaneously coil embolized with multiple overlapping 2 mm diameter coils. Ultimately, the supplying caudal division was then able to be selected and was successfully percutaneous coil embolized with two overlapping 2 mm diameter interlock coils to the level of the supplying arcade vessel's origin.  Completion sigmoid arteriogram was negative for any residual area of active extravasation.  IMPRESSION: Technically successful percutaneous coil embolization of a distal sigmoid arcade vessel for active extravasation correlating with the area of active GI bleeding seen on preceding nuclear medicine tagged with blood cell scan.   Electronically Signed   By: Sandi Mariscal M.D.   On: 05/18/2014 09:40   Ir Angiogram Follow Up Study  05/18/2014   INDICATION: History of recent TAVR procedure, with subsequent initiation of Plavix and aspirin, now admitted with acute lower GI bleed with nuclear medicine tagged red blood cell scan compatible with acute lower GI bleed within the sigmoid colon of the left lower abdomen.  EXAM: 1. ULTRASOUND GUIDANCE FOR ARTERIAL ACCESS 2. SUPERIOR AND INFERIOR MESENTERIC ARTERIOGRAMS 3. SUB SELECTIVE  SIGMOID ARTERIOGRAM (3rd ORDER) 4. SUB SELECTIVE SIGMOID ARCADE ARTERIOGRAM (3rd ORDER) AND PERCUTANEOUS COIL EMBOLIZATION  COMPARISON:  Nuclear medicine tagged bleeding scan - 05/17/2014; CTA of the chest, abdomen pelvis - 02/21/2014  MEDICATIONS: Fentanyl 100 mcg IV; Versed 3 mg IV  CONTRAST:  163m OMNIPAQUE IOHEXOL 300 MG/ML  SOLN  FLUOROSCOPY TIME:   31 minutes 6 seconds.  ACCESS: Left common femoral artery; hemostasis achieved with the deployment of an Exoseal device and manual compression  COMPLICATIONS: None immediate  TECHNIQUE: Informed written consent was obtained from the patient and the patient's white after a discussion of the risks, benefits and alternatives to treatment. Questions regarding the procedure were encouraged and answered. A timeout was performed prior to the initiation of the procedure.  Given the presence of a healing incision at the patient's right groin secondary to recent TAVR procedure, the left groin was selected for arterial access. As such, the left groin was prepped and draped in the usual sterile fashion, and a sterile drape was applied covering the operative field. Maximum barrier sterile technique with sterile gowns and gloves were used for the procedure. A timeout was performed prior to the initiation of the procedure. Local anesthesia was provided with 1% lidocaine.  The left femoral head was marked fluoroscopically. Under ultrasound guidance, the left common femoral artery was accessed with a micropuncture kit after the overlying soft tissues were anesthetized with 1% lidocaine. An ultrasound image was saved for documentation purposes. The micropuncture sheath was exchanged for a 5 [place] sheath over a Bentson wire. A closure arteriogram was performed through the side of the sheath confirming access within the right common femoral artery.  Over a Bentson wire, a Mickelson catheter was advanced to the level of the thoracic aorta where it was back bled and flushed. The catheter was then utilized to select the SMA and several superior mesenteric arteriograms were performed in various obliquities.  The Mickelson catheter was then utilized to select the IMA and several inferior mesenteric arteriograms were performed in various obliquities demonstrating an ill-defined area of active extravasation originating from a distal  sigmoid arcade vessel compatible with the findings on preceding nuclear medicine tagged will blood cell scan.  With the use of a Synchro 14 soft tip micro wire, a regular Renegade micro catheter was advanced to the level of the sigmoid artery and a selective sigmoid arteriogram was performed. The micro catheter was then utilized to select the providing arcade vessel and a sub selective sigmoid arcade arteriogram was performed.  Prolonged efforts were made to sub select the caudal division of this distal sigmoid arcade supplying the area of active extravasation however this initially proved unsuccessful. As such, the cranial division of this distal sigmoid arcade was percutaneously coil embolized with multiple overlapping pushable 2 mm diameter coils to the origin of the caudal tributary. With the dominant cranial division now embolized, the caudal division providing the area of active extravasation was then successfully cannulated and percutaneously coil embolized with multiple overlapping 2 mm diameter interlock coils to the level of the origin of the sigmoid arcade vessel. The micro catheter was retracted into the sigmoid artery and a completion sigmoid arteriogram was performed.  At this point, the procedure was terminated. All wires catheters and sheaths were removed from the patient. Hemostasis was achieved at the left groin access site with deployment of an Exoseal device and manual compression. A dressing was placed. The patient tolerated the procedure well without immediate postprocedural complication.  FINDINGS: Multiple superior  mesenteric arteriograms were performed centering over the area of active extravasation seen on preceding nuclear medicine tagged red blood cell study, though was negative for discrete area of extravasation or vessel irregularity.  As such, multiple inferior mesenteric arteriograms were performed ultimately demonstrating an ill-defined area of active extravasation within the left  lower abdomen correlating with the area of suspected bleeding seen on preceding tagged nuclear medicine red blood cell scan.  Selective sigmoid arteriogram confirmed an area of ill-defined active extravasation within the sigmoid colon of the left lower abdomen supplied by a distal sigmoid arcade vessel. This distal arcade vessel was noted to have proximal bifurcation with the caudal division supplying the area of active extravasation. Prolonged efforts were made to sub selective this caudal division however this proved difficult secondary to tortuosity of the division's origin. As such, the dominant cranial division was selected and percutaneously coil embolized with multiple overlapping 2 mm diameter coils. Ultimately, the supplying caudal division was then able to be selected and was successfully percutaneous coil embolized with two overlapping 2 mm diameter interlock coils to the level of the supplying arcade vessel's origin.  Completion sigmoid arteriogram was negative for any residual area of active extravasation.  IMPRESSION: Technically successful percutaneous coil embolization of a distal sigmoid arcade vessel for active extravasation correlating with the area of active GI bleeding seen on preceding nuclear medicine tagged with blood cell scan.   Electronically Signed   By: Sandi Mariscal M.D.   On: 05/18/2014 09:40   Ir US Guide Vasc Access Left  05/18/2014   INDICATION: History of recent TAVR procedure, with subsequent initiation of Plavix and aspirin, now admitted with acute lower GI bleed with nuclear medicine tagged red blood cell scan compatible with acute lower GI bleed within the sigmoid colon of the left lower abdomen.  EXAM: 1. ULTRASOUND GUIDANCE FOR ARTERIAL ACCESS 2. SUPERIOR AND INFERIOR MESENTERIC ARTERIOGRAMS 3. SUB SELECTIVE SIGMOID ARTERIOGRAM (3rd ORDER) 4. SUB SELECTIVE SIGMOID ARCADE ARTERIOGRAM (3rd ORDER) AND PERCUTANEOUS COIL EMBOLIZATION  COMPARISON:  Nuclear medicine tagged bleeding  scan - 05/17/2014; CTA of the chest, abdomen pelvis - 02/21/2014  MEDICATIONS: Fentanyl 100 mcg IV; Versed 3 mg IV  CONTRAST:  115m OMNIPAQUE IOHEXOL 300 MG/ML  SOLN  FLUOROSCOPY TIME:  31 minutes 6 seconds.  ACCESS: Left common femoral artery; hemostasis achieved with the deployment of an Exoseal device and manual compression  COMPLICATIONS: None immediate  TECHNIQUE: Informed written consent was obtained from the patient and the patient's white after a discussion of the risks, benefits and alternatives to treatment. Questions regarding the procedure were encouraged and answered. A timeout was performed prior to the initiation of the procedure.  Given the presence of a healing incision at the patient's right groin secondary to recent TAVR procedure, the left groin was selected for arterial access. As such, the left groin was prepped and draped in the usual sterile fashion, and a sterile drape was applied covering the operative field. Maximum barrier sterile technique with sterile gowns and gloves were used for the procedure. A timeout was performed prior to the initiation of the procedure. Local anesthesia was provided with 1% lidocaine.  The left femoral head was marked fluoroscopically. Under ultrasound guidance, the left common femoral artery was accessed with a micropuncture kit after the overlying soft tissues were anesthetized with 1% lidocaine. An ultrasound image was saved for documentation purposes. The micropuncture sheath was exchanged for a 5 [place] sheath over a Bentson wire. A closure arteriogram was performed through  the side of the sheath confirming access within the right common femoral artery.  Over a Bentson wire, a Mickelson catheter was advanced to the level of the thoracic aorta where it was back bled and flushed. The catheter was then utilized to select the SMA and several superior mesenteric arteriograms were performed in various obliquities.  The Mickelson catheter was then  utilized to select the IMA and several inferior mesenteric arteriograms were performed in various obliquities demonstrating an ill-defined area of active extravasation originating from a distal sigmoid arcade vessel compatible with the findings on preceding nuclear medicine tagged will blood cell scan.  With the use of a Synchro 14 soft tip micro wire, a regular Renegade micro catheter was advanced to the level of the sigmoid artery and a selective sigmoid arteriogram was performed. The micro catheter was then utilized to select the providing arcade vessel and a sub selective sigmoid arcade arteriogram was performed.  Prolonged efforts were made to sub select the caudal division of this distal sigmoid arcade supplying the area of active extravasation however this initially proved unsuccessful. As such, the cranial division of this distal sigmoid arcade was percutaneously coil embolized with multiple overlapping pushable 2 mm diameter coils to the origin of the caudal tributary. With the dominant cranial division now embolized, the caudal division providing the area of active extravasation was then successfully cannulated and percutaneously coil embolized with multiple overlapping 2 mm diameter interlock coils to the level of the origin of the sigmoid arcade vessel. The micro catheter was retracted into the sigmoid artery and a completion sigmoid arteriogram was performed.  At this point, the procedure was terminated. All wires catheters and sheaths were removed from the patient. Hemostasis was achieved at the left groin access site with deployment of an Exoseal device and manual compression. A dressing was placed. The patient tolerated the procedure well without immediate postprocedural complication.  FINDINGS: Multiple superior mesenteric arteriograms were performed centering over the area of active extravasation seen on preceding nuclear medicine tagged red blood cell study, though was negative for discrete area  of extravasation or vessel irregularity.  As such, multiple inferior mesenteric arteriograms were performed ultimately demonstrating an ill-defined area of active extravasation within the left lower abdomen correlating with the area of suspected bleeding seen on preceding tagged nuclear medicine red blood cell scan.  Selective sigmoid arteriogram confirmed an area of ill-defined active extravasation within the sigmoid colon of the left lower abdomen supplied by a distal sigmoid arcade vessel. This distal arcade vessel was noted to have proximal bifurcation with the caudal division supplying the area of active extravasation. Prolonged efforts were made to sub selective this caudal division however this proved difficult secondary to tortuosity of the division's origin. As such, the dominant cranial division was selected and percutaneously coil embolized with multiple overlapping 2 mm diameter coils. Ultimately, the supplying caudal division was then able to be selected and was successfully percutaneous coil embolized with two overlapping 2 mm diameter interlock coils to the level of the supplying arcade vessel's origin.  Completion sigmoid arteriogram was negative for any residual area of active extravasation.  IMPRESSION: Technically successful percutaneous coil embolization of a distal sigmoid arcade vessel for active extravasation correlating with the area of active GI bleeding seen on preceding nuclear medicine tagged with blood cell scan.   Electronically Signed   By: Sandi Mariscal M.D.   On: 05/18/2014 09:40   Ir Embo Arterial Not [employer]  05/18/2014   INDICATION:  History of recent TAVR procedure, with subsequent initiation of Plavix and aspirin, now admitted with acute lower GI bleed with nuclear medicine tagged red blood cell scan compatible with acute lower GI bleed within the sigmoid colon of the left lower abdomen.  EXAM: 1. ULTRASOUND GUIDANCE FOR ARTERIAL ACCESS 2. SUPERIOR  AND INFERIOR MESENTERIC ARTERIOGRAMS 3. SUB SELECTIVE SIGMOID ARTERIOGRAM (3rd ORDER) 4. SUB SELECTIVE SIGMOID ARCADE ARTERIOGRAM (3rd ORDER) AND PERCUTANEOUS COIL EMBOLIZATION  COMPARISON:  Nuclear medicine tagged bleeding scan - 05/17/2014; CTA of the chest, abdomen pelvis - 02/21/2014  MEDICATIONS: Fentanyl 100 mcg IV; Versed 3 mg IV  CONTRAST:  117m OMNIPAQUE IOHEXOL 300 MG/ML  SOLN  FLUOROSCOPY TIME:  31 minutes 6 seconds.  ACCESS: Left common femoral artery; hemostasis achieved with the deployment of an Exoseal device and manual compression  COMPLICATIONS: None immediate  TECHNIQUE: Informed written consent was obtained from the patient and the patient's white after a discussion of the risks, benefits and alternatives to treatment. Questions regarding the procedure were encouraged and answered. A timeout was performed prior to the initiation of the procedure.  Given the presence of a healing incision at the patient's right groin secondary to recent TAVR procedure, the left groin was selected for arterial access. As such, the left groin was prepped and draped in the usual sterile fashion, and a sterile drape was applied covering the operative field. Maximum barrier sterile technique with sterile gowns and gloves were used for the procedure. A timeout was performed prior to the initiation of the procedure. Local anesthesia was provided with 1% lidocaine.  The left femoral head was marked fluoroscopically. Under ultrasound guidance, the left common femoral artery was accessed with a micropuncture kit after the overlying soft tissues were anesthetized with 1% lidocaine. An ultrasound image was saved for documentation purposes. The micropuncture sheath was exchanged for a 5 [place] sheath over a Bentson wire. A closure arteriogram was performed through the side of the sheath confirming access within the right common femoral artery.  Over a Bentson wire, a Mickelson catheter was advanced to the level of the  thoracic aorta where it was back bled and flushed. The catheter was then utilized to select the SMA and several superior mesenteric arteriograms were performed in various obliquities.  The Mickelson catheter was then utilized to select the IMA and several inferior mesenteric arteriograms were performed in various obliquities demonstrating an ill-defined area of active extravasation originating from a distal sigmoid arcade vessel compatible with the findings on preceding nuclear medicine tagged will blood cell scan.  With the use of a Synchro 14 soft tip micro wire, a regular Renegade micro catheter was advanced to the level of the sigmoid artery and a selective sigmoid arteriogram was performed. The micro catheter was then utilized to select the providing arcade vessel and a sub selective sigmoid arcade arteriogram was performed.  Prolonged efforts were made to sub select the caudal division of this distal sigmoid arcade supplying the area of active extravasation however this initially proved unsuccessful. As such, the cranial division of this distal sigmoid arcade was percutaneously coil embolized with multiple overlapping pushable 2 mm diameter coils to the origin of the caudal tributary. With the dominant cranial division now embolized, the caudal division providing the area of active extravasation was then successfully cannulated and percutaneously coil embolized with multiple overlapping 2 mm diameter interlock coils to the level of the origin of the sigmoid arcade vessel. The micro catheter was retracted into the sigmoid artery and a  completion sigmoid arteriogram was performed.  At this point, the procedure was terminated. All wires catheters and sheaths were removed from the patient. Hemostasis was achieved at the left groin access site with deployment of an Exoseal device and manual compression. A dressing was placed. The patient tolerated the procedure well without immediate postprocedural complication.   FINDINGS: Multiple superior mesenteric arteriograms were performed centering over the area of active extravasation seen on preceding nuclear medicine tagged red blood cell study, though was negative for discrete area of extravasation or vessel irregularity.  As such, multiple inferior mesenteric arteriograms were performed ultimately demonstrating an ill-defined area of active extravasation within the left lower abdomen correlating with the area of suspected bleeding seen on preceding tagged nuclear medicine red blood cell scan.  Selective sigmoid arteriogram confirmed an area of ill-defined active extravasation within the sigmoid colon of the left lower abdomen supplied by a distal sigmoid arcade vessel. This distal arcade vessel was noted to have proximal bifurcation with the caudal division supplying the area of active extravasation. Prolonged efforts were made to sub selective this caudal division however this proved difficult secondary to tortuosity of the division's origin. As such, the dominant cranial division was selected and percutaneously coil embolized with multiple overlapping 2 mm diameter coils. Ultimately, the supplying caudal division was then able to be selected and was successfully percutaneous coil embolized with two overlapping 2 mm diameter interlock coils to the level of the supplying arcade vessel's origin.  Completion sigmoid arteriogram was negative for any residual area of active extravasation.  IMPRESSION: Technically successful percutaneous coil embolization of a distal sigmoid arcade vessel for active extravasation correlating with the area of active GI bleeding seen on preceding nuclear medicine tagged with blood cell scan.   Electronically Signed   By: Sandi Mariscal M.D.   On: 05/18/2014 09:40    Anti-infectives: Anti-infectives   None      Assessment/Plan: s/p mesenteric angio with embolization of distal sigmoid arcade vessel secondary to acute lower GI bleed 7/11; stable at  present-latest hgb 9.0 (8.7); plans as outlined by CCM/GI.  LOS: 1 day    ALLRED,D [facility] 05/18/2014

## 2014-05-19 ENCOUNTER — Inpatient Hospital Stay (HOSPITAL_COMMUNITY): Payer: Medicare Other

## 2014-05-19 DIAGNOSIS — I509 Heart failure, unspecified: Secondary | ICD-10-CM

## 2014-05-19 DIAGNOSIS — I359 Nonrheumatic aortic valve disorder, unspecified: Secondary | ICD-10-CM

## 2014-05-19 DIAGNOSIS — D68318 Other hemorrhagic disorder due to intrinsic circulating anticoagulants, antibodies, or inhibitors: Secondary | ICD-10-CM

## 2014-05-19 DIAGNOSIS — I9589 Other hypotension: Secondary | ICD-10-CM

## 2014-05-19 LAB — CBC
HCT: 22.1 % — ABNORMAL LOW (ref 39.0–52.0)
HCT: 23.9 % — ABNORMAL LOW (ref 39.0–52.0)
HEMOGLOBIN: 7.3 g/dL — AB (ref 13.0–17.0)
HEMOGLOBIN: 7.9 g/dL — AB (ref 13.0–17.0)
MCH: 30 pg (ref 26.0–34.0)
MCH: 30.6 pg (ref 26.0–34.0)
MCHC: 33 g/dL (ref 30.0–36.0)
MCHC: 33.1 g/dL (ref 30.0–36.0)
MCV: 90.9 fL (ref 78.0–100.0)
MCV: 92.6 fL (ref 78.0–100.0)
Platelets: 92 10*3/uL — ABNORMAL LOW (ref 150–400)
Platelets: 102 10*3/uL — ABNORMAL LOW (ref 150–400)
RBC: 2.43 MIL/uL — ABNORMAL LOW (ref 4.22–5.81)
RBC: 2.58 MIL/uL — AB (ref 4.22–5.81)
RDW: 17.8 % — AB (ref 11.5–15.5)
RDW: 17.5 % — ABNORMAL HIGH (ref 11.5–15.5)
WBC: 13 10*3/uL — AB (ref 4.0–10.5)
WBC: 12.3 10*3/uL — ABNORMAL HIGH (ref 4.0–10.5)

## 2014-05-19 LAB — MAGNESIUM: Magnesium: 2 mg/dL (ref 1.5–2.5)

## 2014-05-19 LAB — BASIC METABOLIC PANEL
ANION GAP: 11 (ref 5–15)
BUN: 37 mg/dL — ABNORMAL HIGH (ref 6–23)
CHLORIDE: 110 meq/L (ref 96–112)
CO2: 22 meq/L (ref 19–32)
Calcium: 7.8 mg/dL — ABNORMAL LOW (ref 8.4–10.5)
Creatinine, Ser: 0.9 mg/dL (ref 0.50–1.35)
GFR calc Af Amer: 89 mL/min — ABNORMAL LOW (ref >90)
GFR calc non Af Amer: 77 mL/min — ABNORMAL LOW (ref >90)
Glucose, Bld: 121 mg/dL — ABNORMAL HIGH (ref 70–99)
Potassium: 4 mEq/L (ref 3.7–5.3)
SODIUM: 143 meq/L (ref 137–147)

## 2014-05-19 LAB — TROPONIN I: Troponin I: <0.3 ng/mL (ref <0.30)

## 2014-05-19 LAB — PHOSPHORUS: Phosphorus: 4.1 mg/dL (ref 2.3–4.6)

## 2014-05-19 MED ORDER — CLOPIDOGREL BISULFATE 75 MG PO TABS
75.0000 mg | ORAL_TABLET | Freq: Every day | ORAL | Status: DC
  Administered 2014-05-20 – 2014-05-22 (×3): 75 mg via ORAL
  Filled 2014-05-19 (×5): qty 1

## 2014-05-19 MED ORDER — ASPIRIN EC 81 MG PO TBEC
81.0000 mg | DELAYED_RELEASE_TABLET | Freq: Every day | ORAL | Status: DC
  Filled 2014-05-19 (×2): qty 1

## 2014-05-19 MED ORDER — CLOPIDOGREL BISULFATE 75 MG PO TABS
75.0000 mg | ORAL_TABLET | Freq: Every day | ORAL | Status: DC
  Filled 2014-05-19: qty 1

## 2014-05-19 MED ORDER — PANTOPRAZOLE SODIUM 40 MG PO TBEC
40.0000 mg | DELAYED_RELEASE_TABLET | Freq: Two times a day (BID) | ORAL | Status: DC
  Administered 2014-05-19 – 2014-05-22 (×6): 40 mg via ORAL
  Filled 2014-05-19 (×5): qty 1

## 2014-05-19 NOTE — Clinical Documentation Improvement (Signed)
Presents with "large volume lower GIB" documented in 7/12 progress note by Cardiology.   Hypovolemic Shock documented by Dr. Thayer Jew  No mention anywhere else in chart  She notes BP's 93 systolic; repeat BP's 21'H systolic  2L NS boluses given; total of 3 units packed cells  Dr. Dina Rich also notes "volume contracted"  Treated with coil embolization of actively bleeding arcade vessel within sigmoid colon  If you agree with Dr. Eliezer Bottom assessment, please document hypovolemic shock resolved, resolving in next progress so the Coder's can capture the diagnosis to reflect accurate Severity of Illness and Risk of Mortality in the care you are providing this patient.   Thank You, Zoila Shutter ,RN Clinical Documentation Specialist:  [facility] [employer]

## 2014-05-19 NOTE — Evaluation (Signed)
Physical Therapy Evaluation Patient Details Name: [name] [name] MRN: [id number] DOB: May 09, 1929 Today's Date: 05/19/2014   History of Present Illness  Admitted with large volume GI Bleed, s/p embolization in IR  Clinical Impression  Pt admitted with/for GIB.  Pt currently limited functionally due to the problems listed below.  (see problems list.)  Pt will benefit from PT to maximize function and safety to be able to get home safely with available assist of family.     Follow Up Recommendations No PT follow up    Equipment Recommendations  None recommended by PT    Recommendations for Other Services       Precautions / Restrictions Precautions Precautions: Fall      Mobility  Bed Mobility Overal bed mobility: Needs Assistance Bed Mobility: Supine to Sit     Supine to sit: Supervision;HOB elevated        Transfers Overall transfer level: Needs assistance   Transfers: Sit to/from Stand Sit to Stand: Supervision         General transfer comment: safety only, good technique  Ambulation/Gait Ambulation/Gait assistance: Supervision Ambulation Distance (Feet): 400 Feet Assistive device: None Gait Pattern/deviations: Step-through pattern Gait velocity: functional speed Gait velocity interpretation: >2.62 ft/sec, indicative of independent community ambulator General Gait Details: Initially mildly unsteady then became more steady with distance  Stairs            Wheelchair Mobility    Modified Rankin (Stroke Patients Only)       Balance Overall balance assessment: Needs assistance Sitting-balance support: No upper extremity supported Sitting balance-Leahy Scale: Fair Sitting balance - Comments: could reach down and readjust socks   Standing balance support: Bilateral upper extremity supported Standing balance-Leahy Scale: Fair                               Pertinent Vitals/Pain     Home Living Family/patient expects to be  discharged to:: Private residence Living Arrangements: Spouse/significant other Available Help at Discharge: Family;Available 24 hours/day Type of Home: House Home Access: Stairs to enter Entrance Stairs-Rails: Psychiatric nurse of Steps: several Home Layout: Two level Home Equipment: Walker - 2 wheels      Prior Function Level of Independence: Independent               Hand Dominance        Extremity/Trunk Assessment   Upper Extremity Assessment: Overall WFL for tasks assessed (weaker with bil tricep presses)           Lower Extremity Assessment: Overall WFL for tasks assessed      Cervical / Trunk Assessment: Kyphotic  Communication   Communication: No difficulties  Cognition Arousal/Alertness: Awake/alert Behavior During Therapy: WFL for tasks assessed/performed Overall Cognitive Status: Within Functional Limits for tasks assessed                      General Comments      Exercises        Assessment/Plan    PT Assessment Patient needs continued PT services  PT Diagnosis Other (comment) (decr activity tolerance)   PT Problem List Decreased activity tolerance;Decreased mobility;Cardiopulmonary status limiting activity  PT Treatment Interventions Gait training;Stair training;Functional mobility training;Therapeutic activities;Patient/family education   PT Goals (Current goals can be found in the Care Plan section) Acute Rehab PT Goals Patient Stated Goal: Independent PT Goal Formulation: With patient Time For Goal Achievement: 05/26/14 Potential to Achieve  Goals: Good    Frequency Min 2X/week   Barriers to discharge        Co-evaluation               End of Session   Activity Tolerance: Patient tolerated treatment well Patient left: in bed;with call bell/phone within reach Nurse Communication: Mobility status         Time: 1735-1758 PT Time Calculation (min): 23 min   Charges:   PT  Evaluation $Initial PT Evaluation Tier I: 1 Procedure PT Treatments $Gait Training: 8-22 mins   PT G Codes:          Skyelyn Scruggs, Tessie Fass 05/19/2014, 6:21 PM  05/19/2014  Donnella Sham, PT [phone]  (pager)

## 2014-05-19 NOTE — Progress Notes (Signed)
Patient ID: [name] [name], male   DOB: Mar 19, 1929, 80 y.o.   MRN: [id number]    Subjective:  Denies SSCP, palpitations or Dyspnea Improved being moved to 2W  Objective:  Filed Vitals:   05/19/14 0800 05/19/14 0842 05/19/14 0900 05/19/14 1000  BP: 98/54   105/59  Pulse: 73  96 74  Temp:  98.3 F (36.8 C)    TempSrc: Oral Oral Oral Oral  Resp: 19  18 20   Height:      Weight:      SpO2: 97%  98% 98%    Intake/Output from previous day:  Intake/Output Summary (Last 24 hours) at 05/19/14 1123 Last data filed at 05/19/14 1000  Gross per 24 hour  Intake   1425 ml  Output    900 ml  Net    525 ml    Physical Exam: Affect appropriate Elderly male  HEENT: normal Neck supple with no adenopathy JVP normal no bruits no thyromegaly Lungs clear with no wheezing and good diaphragmatic motion Heart:  S1/S2 SEM  murmur, no rub, gallop or click PMI normal Abdomen: benighn, BS positve, no tenderness, no AAA no bruit.  No HSM or HJR Distal pulses intact with no bruits  TAVR access site healing well  No edema Neuro non-focal Skin warm and dry No muscular weakness   Lab Results: Basic Metabolic Panel:  Recent Labs  05/17/14 1055 05/18/14 0845 05/18/14 1102 05/19/14 0238  NA 148* 149*  --   --   K 4.2 4.1  --   --   CL 111 115*  --   --   CO2 25 18*  --   --   GLUCOSE 105* 104*  --   --   BUN 29* 31*  --   --   CREATININE 0.98 0.80  --   --   CALCIUM 8.4 7.8*  --   --   MG  --   --  2.1 2.0  PHOS  --   --  4.2 4.1   Liver Function Tests:  Recent Labs  05/17/14 1055 05/18/14 0845  AST 14 11  ALT 8 7  ALKPHOS 81 60  BILITOT 0.6 0.7  PROT 5.6* 4.7*  ALBUMIN 3.1* 2.6*   CBC:  Recent Labs  05/18/14 1102  05/18/14 1954 05/19/14 0238  WBC 12.2*  --   --  13.0*  HGB 9.0*  < > 8.5* 7.3*  HCT 26.8*  < > 26.0* 22.1*  MCV 91.8  --   --  90.9  PLT 106*  --   --  92*  < > = values in this interval not displayed. Cardiac Enzymes:  Recent Labs  05/17/14 1122  05/19/14 0238  TROPONINI <0.30 <0.30   Anemia Panel:  Recent Labs  05/17/14 1415  VITAMINB12 436  FOLATE 18.4  FERRITIN 62  TIBC 217  IRON 191*  RETICCTPCT 2.2    Imaging:  Cardiac Studies:  ECG:  afib LBBB PVC   Echo:   Medications:   . amiodarone  200 mg Oral Daily  . ferrous sulfate  325 mg Oral Q breakfast  . methimazole  5 mg Oral Q M,W,F  . pantoprazole  40 mg Oral BID  . tamsulosin  0.4 mg Oral QPM     . sodium chloride 10 mL/hr at 05/18/14 0210  . sodium chloride Stopped (05/19/14 1030)    Assessment/Plan:  CAD:  5/18  DES/atherectomy proximal LAD  GI bleed  Need to resume asa and  plavix   GI Bleed:  Hct still low would consider transfusion on iron  S/p IR embolization  TAVR:  Don't see post op echo 18mm Sapien valve with no significant regurgitation  MVR:  Tissue MVR normal prior to TAVR  Jenkins Rouge 05/19/2014, 11:23 AM

## 2014-05-19 NOTE — Progress Notes (Signed)
Pt well-known to me from recent TAVR and PCI of the LAD. He's had a major lower GI bleed. Have reviewed chart and appreciate care of all consultants. I have a call in to the GI team to discuss antiplatelet Rx. Concerned about his risk of re-bleed, but also at high-risk of thrombotic complications with prox LAD stent less than 2 months ago. Limited data on best Rx in this situation, but might consider monotherapy with Plavix once we're comfortable that he's no longer bleeding.   [name] MD 05/19/2014 1:41 PM

## 2014-05-19 NOTE — Progress Notes (Signed)
PULMONARY / CRITICAL CARE MEDICINE   Name: [name] [name] MRN: [id number] DOB: Jun 09, 1929    ADMISSION DATE:  05/17/2014 CONSULTATION DATE:  05/18/14  REFERRING MD :  [facility], IR, GI PRIMARY SERVICE: [facility]  CHIEF COMPLAINT:  Gi bleed  BRIEF PATIENT DESCRIPTION: 80 yr old h/o colonic polyps presents GI bleed h/o TAVR (plavix, asa), embolized  SIGNIFICANT EVENTS / STUDIES:  7/11 bleeding scan>>>Examination is positive acute lower GI bleed, likely originating  within the sigmoid colon of the left lower abdomen 7/11- IR Embolization 7/12- no pressors, 2 liters fluids, 3 units prbc given  LINES / TUBES: PIV  CULTURES: None  ANTIBIOTICS: None  SUBJECTIVE: no pain Reports feeling "great". Denies any active bleeding, last episode of bleeding occurred on 7/11 with stool mixed with blood. Denies any CP, SOB, abdominal pain. Tolerating clear liquid diet. BPs improved.  VITAL SIGNS: Temp:  [97.5 F (36.4 C)-98.6 F (37 C)] 98.4 F (36.9 C) (07/13 0417) Pulse Rate:  [71-121] 83 (07/13 0600) Resp:  [16-29] 19 (07/13 0600) BP: (76-116)/(39-72) 101/63 mmHg (07/13 0600) SpO2:  [89 %-100 %] 96 % (07/13 0600)  HEMODYNAMICS:   VENTILATOR SETTINGS:   INTAKE / OUTPUT: Intake/Output     07/12 0701 - 07/13 0700 07/13 0701 - 07/14 0700   I.V. (mL/kg) 1220 (14.7)    Blood     IV Piggyback     Total Intake(mL/kg) 1220 (14.7)    Urine (mL/kg/hr) 1000 (0.5)    Stool     Total Output 1000     Net +220           PHYSICAL EXAMINATION: General:  Resting in bed, comfortable, NAD Neuro:  Awake, alert, oriented, non-focal Cardiovascular:  RRR, no murmurs Lungs:  CTAB, normal effort Abdomen:  Soft, NTND, +active BS  LABS: PULMONARY No results found for this basename: PHART, PCO2, PCO2ART, PO2, PO2ART, HCO3, TCO2, O2SAT,  in the last 168 hours  CBC  Recent Labs Lab 05/17/14 1055  05/18/14 1102 05/18/14 1325 05/18/14 1954 05/19/14 0238  HGB 9.0*  < > 9.0* 8.3* 8.5* 7.3*  HCT  27.6*  < > 26.8* 24.8* 26.0* 22.1*  WBC 5.2  --  12.2*  --   --  13.0*  PLT 145*  --  106*  --   --  92*  < > = values in this interval not displayed.  COAGULATION  Recent Labs Lab 05/17/14 1055 05/18/14 0845  INR 1.30 1.27   CARDIAC   Recent Labs Lab 05/17/14 1122 05/19/14 0238  TROPONINI <0.30 <0.30   No results found for this basename: PROBNP,  in the last 168 hours   CHEMISTRY  Recent Labs Lab 05/17/14 1055 05/18/14 0845 05/18/14 1102 05/19/14 0238  NA 148* 149*  --   --   K 4.2 4.1  --   --   CL 111 115*  --   --   CO2 25 18*  --   --   GLUCOSE 105* 104*  --   --   BUN 29* 31*  --   --   CREATININE 0.98 0.80  --   --   CALCIUM 8.4 7.8*  --   --   MG  --   --  2.1 2.0  PHOS  --   --  4.2 4.1   Estimated Creatinine Clearance: 72.2 ml/min (by C-G formula based on Cr of 0.8).  LIVER  Recent Labs Lab 05/17/14 1055 05/18/14 0845  AST 14 11  ALT 8 7  ALKPHOS 81 60  BILITOT 0.6 0.7  PROT 5.6* 4.7*  ALBUMIN 3.1* 2.6*  INR 1.30 1.27     INFECTIOUS No results found for this basename: LATICACIDVEN, PROCALCITON,  in the last 168 hours   ENDOCRINE CBG (last 3)   Recent Labs  05/17/14 2331  GLUCAP 89    IMAGING x48h noted  ASSESSMENT / PLAN:  PULMONARY A: At risk pulm edema s/o Transfusion products  Maintinaing resp status - CXR 7/13 stable bibasilar atelectasis P:   - Monitor - Keep sats greater 92%, currently on RA - Ambulate  CARDIOVASCULAR A:  Hypovolemic Shock, s/p acute GIB - Resolved Extensive cardiac h/o, TAVR, mitral valve replacement   - Bp running a bit soft P:  - Remains off plavix, asa for now, will restart today, w/ ASA to start after 12N 7/12 and plavix to resume 7/13 (see cards consult note)  - Cbc q6h; check stat at 11am - Transfer to Tele - MAP goal 55-60 given valvular dz  RENAL A: mild hypernatremia  P:   - BMET in AM. - KVO IVF.  GASTROINTESTINAL A:  GI bleed, s/p Embolization arcade vessel  sigmoid P:   - Cbc q6h - Heart healthy diet  HEMATOLOGIC A:  Anemia, bleeding GI resolved, h/o thrombocytopenia 6/18 P:  - SCD - D/C scheduled CBC. - Restart Plavix  INFECTIOUS A:  No evidence infection P:   Follow fever curve  GLOBAL Will f/u CBC, if Hg<7 will transfuse, transfer to tele and to [facility], PCCM will sign off, please call back if need.  I have personally obtained a history, examined the patient, evaluated laboratory and imaging results, formulated the assessment and plan and placed orders.   Rush Farmer, M.D. [facility] Pulmonary/Critical Care Medicine. Pager: [phone]. After hours pager: [phone].  05/19/2014 8:18 AM

## 2014-05-20 DIAGNOSIS — K625 Hemorrhage of anus and rectum: Secondary | ICD-10-CM

## 2014-05-20 DIAGNOSIS — D6489 Other specified anemias: Secondary | ICD-10-CM

## 2014-05-20 LAB — CBC
HCT: 21.3 % — ABNORMAL LOW (ref 39.0–52.0)
Hemoglobin: 7 g/dL — ABNORMAL LOW (ref 13.0–17.0)
MCH: 30.6 pg (ref 26.0–34.0)
MCHC: 32.9 g/dL (ref 30.0–36.0)
MCV: 93 fL (ref 78.0–100.0)
PLATELETS: 79 10*3/uL — AB (ref 150–400)
RBC: 2.29 MIL/uL — ABNORMAL LOW (ref 4.22–5.81)
RDW: 17.3 % — AB (ref 11.5–15.5)
WBC: 7.4 10*3/uL (ref 4.0–10.5)

## 2014-05-20 LAB — BASIC METABOLIC PANEL
ANION GAP: 10 (ref 5–15)
BUN: 33 mg/dL — ABNORMAL HIGH (ref 6–23)
CHLORIDE: 111 meq/L (ref 96–112)
CO2: 23 meq/L (ref 19–32)
Calcium: 7.9 mg/dL — ABNORMAL LOW (ref 8.4–10.5)
Creatinine, Ser: 0.87 mg/dL (ref 0.50–1.35)
GFR calc non Af Amer: 78 mL/min — ABNORMAL LOW (ref >90)
GFR, EST AFRICAN AMERICAN: 90 mL/min — AB (ref >90)
Glucose, Bld: 116 mg/dL — ABNORMAL HIGH (ref 70–99)
POTASSIUM: 3.9 meq/L (ref 3.7–5.3)
Sodium: 144 mEq/L (ref 137–147)

## 2014-05-20 LAB — PREPARE RBC (CROSSMATCH)

## 2014-05-20 LAB — PHOSPHORUS: PHOSPHORUS: 3.3 mg/dL (ref 2.3–4.6)

## 2014-05-20 LAB — MAGNESIUM: Magnesium: 2.1 mg/dL (ref 1.5–2.5)

## 2014-05-20 MED ORDER — FUROSEMIDE 10 MG/ML IJ SOLN
20.0000 mg | Freq: Once | INTRAMUSCULAR | Status: AC
  Administered 2014-05-20: 20 mg via INTRAVENOUS
  Filled 2014-05-20: qty 2

## 2014-05-20 NOTE — Progress Notes (Signed)
Patient is extremely weak and unsteady and needs to ambulate with a walker and gait belt. Ambulated with patient approximately 253ft using rolling walker.  Reminded patient to slow down when walking.  Returned to bed resting. Will continue to monitor.

## 2014-05-20 NOTE — Progress Notes (Signed)
Patient Profile: 44M with recent DES to pLAD (03/24/2014) and TAVR (04/22/14) who presents with a hemodynamically significant lower GIB on DAPT (ASA + Plavix). Per GI, bleeding presumed to be from a diverticular bleed. He is now s/p successful coil embolization of culprit vessel.    Subjective: No complaints. He denies CP/SOB. He denies any further rectal bleeding.   Objective: Vital signs in last 24 hours: Temp:  [97.9 F (36.6 C)-98.2 F (36.8 C)] 97.9 F (36.6 C) (07/14 0426) Pulse Rate:  [63-107] 63 (07/14 0426) Resp:  [18] 18 (07/14 0426) BP: (97-112)/(48-77) 97/48 mmHg (07/14 0426) SpO2:  [95 %-99 %] 95 % (07/14 0426) Last BM Date: 05/17/14  Intake/Output from previous day: 07/13 0701 - 07/14 0700 In: 705 [P.O.:480; I.V.:225] Out: 475 [Urine:475] Intake/Output this shift: Total I/O In: 240 [P.O.:240] Out: -   Medications Current Facility-Administered Medications  Medication Dose Route Frequency Provider Last Rate Last Dose  . 0.9 %  sodium chloride infusion   Intravenous Continuous Raylene Miyamoto, MD 10 mL/hr at 05/18/14 0210    . 0.9 %  sodium chloride infusion   Intravenous Continuous Colbert Coyer, MD      . amiodarone (PACERONE) tablet 200 mg  200 mg Oral Daily Hosie Poisson, MD   200 mg at 05/20/14 1032  . clopidogrel (PLAVIX) tablet 75 mg  75 mg Oral Q breakfast Josue Hector, MD   75 mg at 05/20/14 0617  . ferrous sulfate tablet 325 mg  325 mg Oral Q breakfast Hosie Poisson, MD   325 mg at 05/20/14 0617  . furosemide (LASIX) injection 20 mg  20 mg Intravenous Once Belkys A Regalado, MD      . methimazole (TAPAZOLE) tablet 5 mg  5 mg Oral Q M,W,F Hosie Poisson, MD   5 mg at 05/19/14 1026  . ondansetron (ZOFRAN) tablet 4 mg  4 mg Oral Q6H PRN Hosie Poisson, MD       Or  . ondansetron (ZOFRAN) injection 4 mg  4 mg Intravenous Q6H PRN Hosie Poisson, MD      . pantoprazole (PROTONIX) EC tablet 40 mg  40 mg Oral BID Brand Males, MD   40 mg at 05/20/14 1032   . tamsulosin (FLOMAX) capsule 0.4 mg  0.4 mg Oral QPM Hosie Poisson, MD   0.4 mg at 05/19/14 1728    PE: General appearance: alert, cooperative and no distress Lungs: clear to auscultation bilaterally Heart: irregularly irregular rhythm Extremities: no LEE Pulses: 2+ and symmetric Skin: warm and dry Neurologic: Grossly normal  Lab Results:   Recent Labs  05/19/14 0238 05/19/14 1219 05/20/14 0422  WBC 13.0* 12.3* 7.4  HGB 7.3* 7.9* 7.0*  HCT 22.1* 23.9* 21.3*  PLT 92* 102* 79*   BMET  Recent Labs  05/18/14 0845 05/19/14 1219 05/20/14 0422  NA 149* 143 144  K 4.1 4.0 3.9  CL 115* 110 111  CO2 18* 22 23  GLUCOSE 104* 121* 116*  BUN 31* 37* 33*  CREATININE 0.80 0.90 0.87  CALCIUM 7.8* 7.8* 7.9*   PT/INR  Recent Labs  05/18/14 0845  LABPROT 15.9*  INR 1.27     Assessment/Plan    Active Problems:   ANEMIA, IRON DEFICIENCY   HYPERTENSION   CORONARY ARTERY DISEASE   Paroxysmal atrial fibrillation status post cryoablation   PERSONAL HX COLONIC POLYPS   GI bleed   Rectal bleed   Hypotension  1. CAD: s/p DES to proximal LAD 03/24/14. Plavix was restarted today.  ASA has been discontinued. He denies CP.   2. GIB: s/p successful coil embolization of culprit vessel in distal sigmoid.  3. Acute Anemia: Hgb is trending back downward at 7.0 (7.9 yesterday) recommend transfusion. Continue to monitor closely now that Plavix has been restarted.     LOS: 3 days    [name] 05/20/2014 11:50 AM   Patient seen and examined. Agree with assessment and plan. No chest pain or awareness of further GI bleeding. S/P coil embolization to sigmoid arcarde vessel.  H decreased to 7.0 Hct 21.3; to receive 1 unit PRBC today; may need and additional unit if Hb does not rise aprropriately. Plt 79K. Now off ASA on Plavix alone with recent DES LAD stent in May prior to TAVR in June. Platelets at hospital DC post TAVR were 77K.    Troy Sine, MD,  [facility] 05/20/2014 12:17 PM

## 2014-05-20 NOTE — Care Management Note (Signed)
Page 1 of 1   05/22/2014     3:41:39 PM CARE MANAGEMENT NOTE 05/22/2014  Patient:  [name] [name], [name] [name]   Account Number:  [id number]  Date Initiated:  05/19/2014  Documentation initiated by:  [facility]  Subjective/Objective Assessment:   GIB     Action/Plan:   Anticipated DC Date:  05/22/2014   Anticipated DC Plan:  [place]  CM consult      Choice offered to / List presented to:          [facility] arranged  [place] - 11 Patient Refused      Status of service:  Completed, signed off Medicare Important Message given?  YES (If response is "NO", the following Medicare IM given date fields will be blank) Date Medicare IM given:  05/20/2014 Medicare IM given by:  Jailee Jaquez Date Additional Medicare IM given:   Additional Medicare IM given by:    Discharge Disposition:  HOME/SELF CARE  Per UR Regulation:    If discussed at Long Length of Stay Meetings, dates discussed:   05/22/2014    Comments:  05/22/14 Ellan Lambert, RN, BSN [phone] Met with pt to discuss [facility] set up.  Pt states he feels he does not need this, as he has a busy schedule--teaches art classes, works in his church, etc...  Pt likely does not meet criteria for homebound status.  He politely declines HH at this time.  Instructed pt to call primary MD if he has any home needs in the future.  05/21/14 Ellan Lambert, RN, BSN [phone] PT recommending Gem follow up at dc.  Will need orders prior to dc.  05/20/14 Ellan Lambert, RN, BSN [phone] Pt adm on 05/17/14 with anemia, GIB.  PTA, pt independent, lives with spouse.  Will follow for dc needs as pt progresses.

## 2014-05-20 NOTE — Progress Notes (Signed)
Subjective: No further bleeding.  Objective: Vital signs in last 24 hours: Temp:  [97.6 F (36.4 C)-98.8 F (37.1 C)] 98.8 F (37.1 C) (07/14 1419) Pulse Rate:  [63-109] 91 (07/14 1419) Resp:  [18-20] 18 (07/14 1419) BP: (97-122)/(48-83) 114/77 mmHg (07/14 1419) SpO2:  [95 %-98 %] 98 % (07/14 1419) Last BM Date: 05/17/14  Intake/Output from previous day: 07/13 0701 - 07/14 0700 In: 705 [P.O.:480; I.V.:225] Out: 475 [Urine:475] Intake/Output this shift: Total I/O In: 621.7 [P.O.:480; Blood:141.7] Out: 350 [Urine:350]  General appearance: alert and no distress [name]: soft, non-tender; bowel sounds normal; no masses,  no organomegaly  Lab Results:  Recent Labs  05/19/14 0238 05/19/14 1219 05/20/14 0422  WBC 13.0* 12.3* 7.4  HGB 7.3* 7.9* 7.0*  HCT 22.1* 23.9* 21.3*  PLT 92* 102* 79*   BMET  Recent Labs  05/18/14 0845 05/19/14 1219 05/20/14 0422  NA 149* 143 144  K 4.1 4.0 3.9  CL 115* 110 111  CO2 18* 22 23  GLUCOSE 104* 121* 116*  BUN 31* 37* 33*  CREATININE 0.80 0.90 0.87  CALCIUM 7.8* 7.8* 7.9*   LFT  Recent Labs  05/18/14 0845  PROT 4.7*  ALBUMIN 2.6*  AST 11  ALT 7  ALKPHOS 60  BILITOT 0.7   PT/INR  Recent Labs  05/18/14 0845  LABPROT 15.9*  INR 1.27   Hepatitis Panel No results found for this basename: HEPBSAG, HCVAB, HEPAIGM, HEPBIGM,  in the last 72 hours C-Diff No results found for this basename: CDIFFTOX,  in the last 72 hours Fecal Lactopherrin No results found for this basename: FECLLACTOFRN,  in the last 72 hours  Studies/Results: Dg Chest Port 1 View  05/19/2014   CLINICAL DATA:  Assess pulmonary edema.  EXAM: PORTABLE CHEST - 1 VIEW  COMPARISON:  04/23/2014.  FINDINGS: Unchanged orientation of dual-chamber pacer leads from the left.  Stable cardiomegaly. Previous median sternotomy. Status post TAVR. There is patchy ground-glass density in the lower lungs and in the left upper lobe. There is no edema noted within the right  upper long. No effusion or pneumothorax.  IMPRESSION: Bilateral lung opacities, asymmetry favoring atelectasis or pneumonia.   Electronically Signed   By: Jorje Guild M.D.   On: 05/19/2014 06:24    Medications:  Scheduled: . amiodarone  200 mg Oral Daily  . clopidogrel  75 mg Oral Q breakfast  . ferrous sulfate  325 mg Oral Q breakfast  . methimazole  5 mg Oral Q M,W,F  . pantoprazole  40 mg Oral BID  . tamsulosin  0.4 mg Oral QPM   Continuous: . sodium chloride 10 mL/hr at 05/18/14 0210  . sodium chloride Stopped (05/19/14 1030)    Assessment/Plan: 1) Diverticular bleed. 2) CAD s/p DES placement. 3) Anemia.   He has a recurrent anemia, but no overt evidence of bleeding.  He is s/p embolization of his diverticular bleed.  He needs to be on Plavix for his DES.  I think the benefits of being on Plavix outweigh the risk at this time.  I agree with the blood transfusion and restarting Plavix.  Plan: 1) Okay to restart Plavix from the [name] standpoint. 2) Transfuse as necessary. 3) Monitor for any further [name] bleeding.  ADDENDUM:  [name] [name] is a  [name] [name].  I was called by Dr. Tyrell Antonio for follow up as Dr. Collene Mares was on call this weekend.  I did not know that he was a Glidden [name] until after my progress noted.  Please direct any further [name] questions to [name] [name].  LOS: 3 days   [name] D 05/20/2014, 3:51 PM

## 2014-05-20 NOTE — Progress Notes (Signed)
Pt receiving 1unit PRBC, no s/s of transfusion reaction noted. Wife at bedside and very supportive.

## 2014-05-20 NOTE — Progress Notes (Signed)
[facility] PROGRESS NOTE  RONAV FURNEY TKZ:[id number] DOB: [birth year] DOA: 05/17/2014 PCP: [name], MD  Assessment/Plan: 1-GI bleed, s/p Embolization arcade vessel sigmoid; hb slowly trending down. Today at 7. No evidence of active bleeding. Will transfuse another unit of PRBC. He has received 3 units so far.  s/p mesenteric angio with embolization of distal sigmoid arcade vessel secondary to acute lower GI bleed 7/11  TAVR and PCI of the LAD: Discussed with Dr Burt Knack patient with recent DES if ok with GI. Dr Benson Norway will give [place] recommendations.   Hypovolemic Shock, s/p acute GIB - Resolved  h/o thrombocytopenia monitor.   mild hypernatremia; resolved,.   Code Status: Full  Family Communication: Care discussed with wife.  Disposition Plan: remain inpatient.    Consultants:  Cardiology  GI  IR  Procedures: s/p Embolization arcade vessel sigmoid SIGNIFICANT EVENTS / STUDIES:  7/11 bleeding scan>>>Examination is positive acute lower GI bleed, likely originating  within the sigmoid colon of the left lower abdomen  7/11- IR Embolization  7/12- no pressors, 2 liters fluids, 3 units prbc given  Antibiotics:  none  HPI/Subjective: No BM. No further bleeding. Feeling well.   Objective: Filed Vitals:   05/20/14 0426  BP: 97/48  Pulse: 63  Temp: 97.9 F (36.6 C)  Resp: 18    Intake/Output Summary (Last 24 hours) at 05/20/14 1030 Last data filed at 05/20/14 0800  Gross per 24 hour  Intake    720 ml  Output    475 ml  Net    245 ml   Filed Weights   05/17/14 1045 05/17/14 1615  Weight: 77.111 kg (170 lb) 82.9 kg (182 lb 12.2 oz)    Exam:   General:  No distress  Cardiovascular: S 1, S 2 RRR  Respiratory: CTA  Abdomen: Bs present, soft, NT   Musculoskeletal: no edema.   Data Reviewed: Basic Metabolic Panel:  Recent Labs Lab 05/17/14 1055 05/18/14 0845 05/18/14 1102 05/19/14 0238 05/19/14 1219 05/20/14 0422  NA 148* 149*  --   --   143 144  K 4.2 4.1  --   --  4.0 3.9  CL 111 115*  --   --  110 111  CO2 25 18*  --   --  22 23  GLUCOSE 105* 104*  --   --  121* 116*  BUN 29* 31*  --   --  37* 33*  CREATININE 0.98 0.80  --   --  0.90 0.87  CALCIUM 8.4 7.8*  --   --  7.8* 7.9*  MG  --   --  2.1 2.0  --  2.1  PHOS  --   --  4.2 4.1  --  3.3   Liver Function Tests:  Recent Labs Lab 05/17/14 1055 05/18/14 0845  AST 14 11  ALT 8 7  ALKPHOS 81 60  BILITOT 0.6 0.7  PROT 5.6* 4.7*  ALBUMIN 3.1* 2.6*   No results found for this basename: LIPASE, AMYLASE,  in the last 168 hours No results found for this basename: AMMONIA,  in the last 168 hours CBC:  Recent Labs Lab 05/17/14 1055  05/18/14 1102 05/18/14 1325 05/18/14 1954 05/19/14 0238 05/19/14 1219 05/20/14 0422  WBC 5.2  --  12.2*  --   --  13.0* 12.3* 7.4  HGB 9.0*  < > 9.0* 8.3* 8.5* 7.3* 7.9* 7.0*  HCT 27.6*  < > 26.8* 24.8* 26.0* 22.1* 23.9* 21.3*  MCV 95.8  --  91.8  --   --  90.9 92.6 93.0  PLT 145*  --  106*  --   --  92* 102* 79*  < > = values in this interval not displayed. Cardiac Enzymes:  Recent Labs Lab 05/17/14 1122 05/19/14 0238  TROPONINI <0.30 <0.30   BNP (last 3 results)  Recent Labs  12/05/13 1654 04/05/14 1734  PROBNP 382.0* 3725.0*   CBG:  Recent Labs Lab 05/17/14 2331  GLUCAP 89    Recent Results (from the past 240 hour(s))  MRSA PCR SCREENING     Status: None   Collection Time    05/17/14  4:14 PM      Result Value Ref Range Status   MRSA by PCR NEGATIVE  NEGATIVE Final   Comment:            The GeneXpert MRSA Assay (FDA     approved for NASAL specimens     only), is one component of a     comprehensive MRSA colonization     surveillance program. It is not     intended to diagnose MRSA     infection nor to guide or     monitor treatment for     MRSA infections.     Studies: Dg Chest Port 1 View  05/19/2014   CLINICAL DATA:  Assess pulmonary edema.  EXAM: PORTABLE CHEST - 1 VIEW  COMPARISON:   04/23/2014.  FINDINGS: Unchanged orientation of dual-chamber pacer leads from the left.  Stable cardiomegaly. Previous median sternotomy. Status post TAVR. There is patchy ground-glass density in the lower lungs and in the left upper lobe. There is no edema noted within the right upper long. No effusion or pneumothorax.  IMPRESSION: Bilateral lung opacities, asymmetry favoring atelectasis or pneumonia.   Electronically Signed   By: Jorje Guild M.D.   On: 05/19/2014 06:24    Scheduled Meds: . amiodarone  200 mg Oral Daily  . aspirin EC  81 mg Oral Daily  . clopidogrel  75 mg Oral Q breakfast  . ferrous sulfate  325 mg Oral Q breakfast  . methimazole  5 mg Oral Q M,W,F  . pantoprazole  40 mg Oral BID  . tamsulosin  0.4 mg Oral QPM   Continuous Infusions: . sodium chloride 10 mL/hr at 05/18/14 0210  . sodium chloride Stopped (05/19/14 1030)    Active Problems:   ANEMIA, IRON DEFICIENCY   HYPERTENSION   CORONARY ARTERY DISEASE   Paroxysmal atrial fibrillation status post cryoablation   PERSONAL HX COLONIC POLYPS   GI bleed   Rectal bleed   Hypotension    Time spent: 35 minutes.     Niel Hummer A  [facility] Pager [phone]. If 7PM-7AM, please contact night-coverage at [email or web address], password [facility] 05/20/2014, 10:30 AM  LOS: 3 days

## 2014-05-21 DIAGNOSIS — I4891 Unspecified atrial fibrillation: Secondary | ICD-10-CM

## 2014-05-21 LAB — TYPE AND SCREEN
ABO/RH(D): B NEG
ANTIBODY SCREEN: NEGATIVE
UNIT DIVISION: 0
UNIT DIVISION: 0
UNIT DIVISION: 0
Unit division: 0
Unit division: 0
Unit division: 0
Unit division: 0
Unit division: 0

## 2014-05-21 LAB — BASIC METABOLIC PANEL
ANION GAP: 11 (ref 5–15)
BUN: 25 mg/dL — ABNORMAL HIGH (ref 6–23)
CHLORIDE: 109 meq/L (ref 96–112)
CO2: 24 mEq/L (ref 19–32)
Calcium: 7.8 mg/dL — ABNORMAL LOW (ref 8.4–10.5)
Creatinine, Ser: 0.91 mg/dL (ref 0.50–1.35)
GFR calc Af Amer: 88 mL/min — ABNORMAL LOW (ref >90)
GFR, EST NON AFRICAN AMERICAN: 76 mL/min — AB (ref >90)
Glucose, Bld: 99 mg/dL (ref 70–99)
POTASSIUM: 4 meq/L (ref 3.7–5.3)
SODIUM: 144 meq/L (ref 137–147)

## 2014-05-21 LAB — CBC
HCT: 23.3 % — ABNORMAL LOW (ref 39.0–52.0)
Hemoglobin: 7.6 g/dL — ABNORMAL LOW (ref 13.0–17.0)
MCH: 29.6 pg (ref 26.0–34.0)
MCHC: 32.6 g/dL (ref 30.0–36.0)
MCV: 90.7 fL (ref 78.0–100.0)
PLATELETS: 79 10*3/uL — AB (ref 150–400)
RBC: 2.57 MIL/uL — ABNORMAL LOW (ref 4.22–5.81)
RDW: 18.4 % — AB (ref 11.5–15.5)
WBC: 6.6 10*3/uL (ref 4.0–10.5)

## 2014-05-21 LAB — MAGNESIUM: MAGNESIUM: 2 mg/dL (ref 1.5–2.5)

## 2014-05-21 LAB — PHOSPHORUS: PHOSPHORUS: 3.7 mg/dL (ref 2.3–4.6)

## 2014-05-21 NOTE — Progress Notes (Addendum)
[facility] PROGRESS NOTE  [name] [name] ZOX:[id number] DOB: 1928-11-17 DOA: 05/17/2014 PCP: [name], MD  Assessment/Plan: 1-GI bleed, Diverticular -s/p mesenteric angio with embolization of distal sigmoid arcade vessel 7/11 -s/p 4 units PRBC this admission -Hb 7.6 today -no further bleeding noted -restarted on Plavix -Home today pending PT eval   TAVR and PCI of the LAD:  -restarted on Plavix.   Hypovolemic Shock, s/p acute GIB - Resolved  h/o thrombocytopenia  -due to consumption, monitor.   mild hypernatremia; resolved,.   Code Status: Full  Family Communication: none at bedside Disposition Plan: home later today if stable   Consultants:  Cardiology  GI  IR  Procedures: s/p Embolization arcade vessel sigmoid SIGNIFICANT EVENTS / STUDIES:  7/11 bleeding scan>>>Examination is positive acute lower GI bleed, likely originating  within the sigmoid colon of the left lower abdomen  7/11- IR Embolization  7/12- no pressors, 2 liters fluids, 3 units prbc given  Antibiotics:  none  HPI/Subjective: No BM. No further bleeding. Feeling better  Objective: Filed Vitals:   05/21/14 0442  BP: 109/64  Pulse: 73  Temp: 98 F (36.7 C)  Resp: 18    Intake/Output Summary (Last 24 hours) at 05/21/14 1321 Last data filed at 05/21/14 0951  Gross per 24 hour  Intake 621.67 ml  Output   2550 ml  Net -1928.33 ml   Filed Weights   05/17/14 1045 05/17/14 1615  Weight: 77.111 kg (170 lb) 82.9 kg (182 lb 12.2 oz)    Exam:   General:  No distress  Cardiovascular: S 1, S 2 RRR  Respiratory: CTA  Abdomen: Bs present, soft, NT   Musculoskeletal: no edema.   Data Reviewed: Basic Metabolic Panel:  Recent Labs Lab 05/17/14 1055 05/18/14 0845 05/18/14 1102 05/19/14 0238 05/19/14 1219 05/20/14 0422 05/21/14 0544  NA 148* 149*  --   --  143 144 144  K 4.2 4.1  --   --  4.0 3.9 4.0  CL 111 115*  --   --  110 111 109  CO2 25 18*  --   --  22 23  24   GLUCOSE 105* 104*  --   --  121* 116* 99  BUN 29* 31*  --   --  37* 33* 25*  CREATININE 0.98 0.80  --   --  0.90 0.87 0.91  CALCIUM 8.4 7.8*  --   --  7.8* 7.9* 7.8*  MG  --   --  2.1 2.0  --  2.1 2.0  PHOS  --   --  4.2 4.1  --  3.3 3.7   Liver Function Tests:  Recent Labs Lab 05/17/14 1055 05/18/14 0845  AST 14 11  ALT 8 7  ALKPHOS 81 60  BILITOT 0.6 0.7  PROT 5.6* 4.7*  ALBUMIN 3.1* 2.6*   No results found for this basename: LIPASE, AMYLASE,  in the last 168 hours No results found for this basename: AMMONIA,  in the last 168 hours CBC:  Recent Labs Lab 05/18/14 1102  05/18/14 1954 05/19/14 0238 05/19/14 1219 05/20/14 0422 05/21/14 0544  WBC 12.2*  --   --  13.0* 12.3* 7.4 6.6  HGB 9.0*  < > 8.5* 7.3* 7.9* 7.0* 7.6*  HCT 26.8*  < > 26.0* 22.1* 23.9* 21.3* 23.3*  MCV 91.8  --   --  90.9 92.6 93.0 90.7  PLT 106*  --   --  92* 102* 79* 79*  < > = values in this  interval not displayed. Cardiac Enzymes:  Recent Labs Lab 05/17/14 1122 05/19/14 0238  TROPONINI <0.30 <0.30   BNP (last 3 results)  Recent Labs  12/05/13 1654 04/05/14 1734  PROBNP 382.0* 3725.0*   CBG:  Recent Labs Lab 05/17/14 2331  GLUCAP 89    Recent Results (from the past 240 hour(s))  MRSA PCR SCREENING     Status: None   Collection Time    05/17/14  4:14 PM      Result Value Ref Range Status   MRSA by PCR NEGATIVE  NEGATIVE Final   Comment:            The GeneXpert MRSA Assay (FDA     approved for NASAL specimens     only), is one component of a     comprehensive MRSA colonization     surveillance program. It is not     intended to diagnose MRSA     infection nor to guide or     monitor treatment for     MRSA infections.     Studies: No results found.  Scheduled Meds: . amiodarone  200 mg Oral Daily  . clopidogrel  75 mg Oral Q breakfast  . ferrous sulfate  325 mg Oral Q breakfast  . methimazole  5 mg Oral Q M,W,F  . pantoprazole  40 mg Oral BID  . tamsulosin   0.4 mg Oral QPM   Continuous Infusions: . sodium chloride Stopped (05/20/14 1557)  . sodium chloride Stopped (05/19/14 1030)    Active Problems:   ANEMIA, IRON DEFICIENCY   HYPERTENSION   CORONARY ARTERY DISEASE   Paroxysmal atrial fibrillation status post cryoablation   PERSONAL HX COLONIC POLYPS   GI bleed   Rectal bleed   Hypotension    Time spent: 35 minutes.     [facility] Pager [phone]. If 7PM-7AM, please contact night-coverage at [email or web address], password [facility] 05/21/2014, 1:21 PM  LOS: 4 days

## 2014-05-21 NOTE — Progress Notes (Signed)
Pt out 30 days from TAVR - due for 30 day echo. Will order while he is here. Agree with plavix monotherapy in light of GI bleed. Appreciate care of GI/TRH services.  [name] MD 05/21/2014 1:32 PM

## 2014-05-21 NOTE — Evaluation (Signed)
Physical Therapy Re-Evaluation Patient Details Name: [name] [name] MRN: [id number] DOB: 1929-05-13 Today's Date: 05/21/2014   History of Present Illness  Admitted with large volume GI Bleed, s/p embolization in IR  Clinical Impression  Pt presents with deceased high level balance, however is doing very well overall.  Note that he received unit of blood yesterday and perhaps low HGB was cause of decreased balance during gait yesterday.  Performed gait in controlled environment and with head turns at S level today.  Seems to be close to baseline, however did recommend OP therapy if he could get there to address high level balance activities with him.  Pt agreeable, but wanted to think about whether [facility] or OP would be better for him.                                                                 Follow Up Recommendations Home health PT;Outpatient PT (pt to decide and let case manager know)    Equipment Recommendations  None recommended by PT    Recommendations for Other Services       Precautions / Restrictions Precautions Precautions: Fall Restrictions Weight Bearing Restrictions: No      Mobility  Bed Mobility Overal bed mobility: Modified Independent Bed Mobility: Supine to Sit     Supine to sit: HOB elevated;Modified independent (Device/Increase time)     General bed mobility comments: Pt able to get to EOB with increased time and use of bed rails, HOB elevated.   Transfers Overall transfer level: Needs assistance Equipment used: None Transfers: Sit to/from Stand Sit to Stand: Min guard (for safety)         General transfer comment: Provided min/guard for safety due to previous RN note stating decline in function.  Continues to demonstrate good technique.   Ambulation/Gait Ambulation/Gait assistance: Supervision;Min guard Ambulation Distance (Feet): 400 Feet Assistive device: None Gait Pattern/deviations: Step-through pattern;Narrow base of support;Trunk flexed      General Gait Details: Provided min/guard initially as he was slightly unbalanced prior to continued gait, however note balance improved as he continued gait and only requires S  Science writer    Modified Rankin (Stroke Patients Only)       Balance Overall balance assessment: Needs assistance Sitting-balance support: Feet supported Sitting balance-Leahy Scale: Fair Sitting balance - Comments: could reach down and readjust socks     Standing balance-Leahy Scale: Fair                               Pertinent Vitals/Pain No pain    Home Living Family/patient expects to be discharged to:: Private residence Living Arrangements: Spouse/significant other Available Help at Discharge: Family;Available 24 hours/day Type of Home: House Home Access: Stairs to enter Entrance Stairs-Rails: Psychiatric nurse of Steps: several Home Layout: Two level Home Equipment: Walker - 2 wheels      Prior Function Level of Independence: Independent               Hand Dominance        Extremity/Trunk Assessment               Lower Extremity  Assessment: Generalized weakness (but WFL)      Cervical / Trunk Assessment: Kyphotic  Communication   Communication: No difficulties  Cognition Arousal/Alertness: Awake/alert Behavior During Therapy: WFL for tasks assessed/performed Overall Cognitive Status: Within Functional Limits for tasks assessed                      General Comments      Exercises        Assessment/Plan    PT Assessment Patient needs continued PT services  PT Diagnosis Generalized weakness   PT Problem List Decreased activity tolerance;Decreased mobility;Cardiopulmonary status limiting activity  PT Treatment Interventions Gait training;Stair training;Functional mobility training;Therapeutic activities;Patient/family education   PT Goals (Current goals can be found in the Care Plan  section) Acute Rehab PT Goals Patient Stated Goal: to return home PT Goal Formulation: With patient Time For Goal Achievement: 05/26/14 Potential to Achieve Goals: Good    Frequency Min 2X/week   Barriers to discharge        Co-evaluation               End of Session Equipment Utilized During Treatment: Gait belt Activity Tolerance: Patient tolerated treatment well Patient left: with call bell/phone within reach;in chair Nurse Communication: Mobility status         Time: 7169-6789 PT Time Calculation (min): 21 min   Charges:   PT Evaluation $PT Re-evaluation: 1 Procedure PT Treatments $Gait Training: 8-22 mins   PT G Codes:          Denice Bors 05/21/2014, 11:17 AM

## 2014-05-21 NOTE — Progress Notes (Signed)
Patient Profile: 28M with recent DES to pLAD (03/24/2014) and TAVR (04/22/14) who presents with a hemodynamically significant lower GIB on DAPT (ASA + Plavix). Per GI, bleeding presumed to be from a diverticular bleed. He is now s/p successful coil embolization of culprit vessel.    Subjective: No complaints. He denies CP/SOB. He denies any further rectal bleeding but has yet to have a BM. Feels like he has a bit more energy today.  Objective: Vital signs in last 24 hours: Temp:  [97.8 F (36.6 C)-98.8 F (37.1 C)] 98 F (36.7 C) (07/15 0442) Pulse Rate:  [73-91] 73 (07/15 0442) Resp:  [18] 18 (07/15 0442) BP: (102-114)/(64-77) 109/64 mmHg (07/15 0442) SpO2:  [96 %-99 %] 99 % (07/15 0442) Last BM Date: 05/17/14  Intake/Output from previous day: 07/14 0701 - 07/15 0700 In: 861.7 [P.O.:720; Blood:141.7] Out: 2550 [Urine:2550] Intake/Output this shift: Total I/O In: 240 [P.O.:240] Out: -    Medications Current Facility-Administered Medications  Medication Dose Route Frequency Provider Last Rate Last Dose  . 0.9 %  sodium chloride infusion   Intravenous Continuous Raylene Miyamoto, MD      . 0.9 %  sodium chloride infusion   Intravenous Continuous Colbert Coyer, MD      . amiodarone (PACERONE) tablet 200 mg  200 mg Oral Daily Hosie Poisson, MD   200 mg at 05/21/14 1113  . clopidogrel (PLAVIX) tablet 75 mg  75 mg Oral Q breakfast Josue Hector, MD   75 mg at 05/21/14 0617  . ferrous sulfate tablet 325 mg  325 mg Oral Q breakfast Hosie Poisson, MD   325 mg at 05/21/14 0617  . methimazole (TAPAZOLE) tablet 5 mg  5 mg Oral Q M,W,F Hosie Poisson, MD   5 mg at 05/21/14 1113  . ondansetron (ZOFRAN) tablet 4 mg  4 mg Oral Q6H PRN Hosie Poisson, MD       Or  . ondansetron (ZOFRAN) injection 4 mg  4 mg Intravenous Q6H PRN Hosie Poisson, MD      . pantoprazole (PROTONIX) EC tablet 40 mg  40 mg Oral BID Brand Males, MD   40 mg at 05/21/14 1113  . tamsulosin (FLOMAX) capsule 0.4 mg   0.4 mg Oral QPM Hosie Poisson, MD   0.4 mg at 05/20/14 1735    PE: General appearance: alert, cooperative and no distress Lungs: clear to auscultation bilaterally Heart: irregularly irregular rhythm Extremities: no LEE Pulses: 2+ and symmetric Skin: warm and dry Neurologic: Grossly normal  Lab Results:   Recent Labs  05/19/14 1219 05/20/14 0422 05/21/14 0544  WBC 12.3* 7.4 6.6  HGB 7.9* 7.0* 7.6*  HCT 23.9* 21.3* 23.3*  PLT 102* 79* 79*   BMET  Recent Labs  05/19/14 1219 05/20/14 0422 05/21/14 0544  NA 143 144 144  K 4.0 3.9 4.0  CL 110 111 109  CO2 22 23 24   GLUCOSE 121* 116* 99  BUN 37* 33* 25*  CREATININE 0.90 0.87 0.91  CALCIUM 7.8* 7.9* 7.8*   PT/INR No results found for this basename: LABPROT, INR,  in the last 72 hours   Assessment/Plan    Active Problems:   ANEMIA, IRON DEFICIENCY   HYPERTENSION   CORONARY ARTERY DISEASE   Paroxysmal atrial fibrillation status post cryoablation   PERSONAL HX COLONIC POLYPS   GI bleed   Rectal bleed   Hypotension  1. CAD: s/p DES to proximal LAD 03/24/14. Plavix was restarted yesterday. ASA has been discontinued. He denies CP.  2. GIB: s/p coil embolization of culprit vessel in distal sigmoid.   3. Acute Anemia: s/p transfusion with 1 unit of PRBC yesterday. Only slight increase in hgb from 7.0 to 7.6 after transfusion.   Pt did not have appropriate 1:3 increase in Hgb:Hct expected with 1 unit. Hgb only increased from 7.0 to 7.6. He is now back on Plavix. Cannot fully r/o recurrent rectal bleeding as he has yet to produce another BM. ? Giving another unit of blood today, prior to discharge. MD to follow.     LOS: 4 days    [name] 05/21/2014 12:46 PM   Patient seen and examined. Agree with assessment and plan. No recurrent chest pain; on plavix monotherapy s/p PCI/Stent in May 1015; ASA dc'd with GI bleed. Incomplete H/H increase with 1 unit PRBC yesterday; would re-check in am and if  H/H drops further consider additional unit of blood. Discussed with primary service and recommend keep in hospital today.F/U echo scheduled for 30 day post TAVR f/U.   Troy Sine, MD, [facility] 05/21/2014 1:56 PM

## 2014-05-22 ENCOUNTER — Other Ambulatory Visit (HOSPITAL_COMMUNITY): Payer: Medicare Other

## 2014-05-22 ENCOUNTER — Ambulatory Visit: Payer: Medicare Other | Admitting: Cardiovascular Disease

## 2014-05-22 DIAGNOSIS — D509 Iron deficiency anemia, unspecified: Secondary | ICD-10-CM

## 2014-05-22 DIAGNOSIS — I379 Nonrheumatic pulmonary valve disorder, unspecified: Secondary | ICD-10-CM

## 2014-05-22 LAB — CBC
HCT: 23.2 % — ABNORMAL LOW (ref 39.0–52.0)
HEMOGLOBIN: 7.6 g/dL — AB (ref 13.0–17.0)
MCH: 30 pg (ref 26.0–34.0)
MCHC: 32.8 g/dL (ref 30.0–36.0)
MCV: 91.7 fL (ref 78.0–100.0)
PLATELETS: 101 10*3/uL — AB (ref 150–400)
RBC: 2.53 MIL/uL — AB (ref 4.22–5.81)
RDW: 17.8 % — ABNORMAL HIGH (ref 11.5–15.5)
WBC: 5.6 10*3/uL (ref 4.0–10.5)

## 2014-05-22 MED ORDER — POLYETHYLENE GLYCOL 3350 17 G PO PACK
17.0000 g | PACK | Freq: Every day | ORAL | Status: DC
  Administered 2014-05-22: 17 g via ORAL
  Filled 2014-05-22: qty 1

## 2014-05-22 NOTE — Progress Notes (Signed)
  Echocardiogram 2D Echocardiogram has been performed.  Donata Clay 05/22/2014, 11:58 AM

## 2014-05-22 NOTE — Progress Notes (Signed)
Assessment unchanged. Discussed D/C instructions with pt including f/u appointments and new medications. Verbalized understanding. RX given to pt. IV and tele removed. Pt left with belongings accompanied by RN.

## 2014-05-22 NOTE — Progress Notes (Signed)
Patient Profile: [name] [name] with recent DES to pLAD (03/24/2014) and TAVR (04/22/14) who presents with a hemodynamically significant lower GIB on DAPT (ASA + Plavix). Per GI, bleeding presumed to be from a diverticular bleed. He is now s/p successful coil embolization of culprit vessel.    Subjective: No complaints. He denies recurrent rectal bleeding. Also denies CP/SOB.   Objective: Vital signs in last 24 hours: Temp:  [97.7 F (36.5 C)-98 F (36.7 C)] 97.9 F (36.6 C) (07/16 0455) Pulse Rate:  [70-83] 70 (07/16 0455) Resp:  [17-18] 18 (07/16 0455) BP: (110-128)/(54-77) 110/67 mmHg (07/16 0455) SpO2:  [96 %-98 %] 97 % (07/16 0455) Last BM Date: 05/17/14  Intake/Output from previous day: 07/15 0701 - 07/16 0700 In: 720 [P.O.:720] Out: 650 [Urine:650] Intake/Output this shift:     Medications Current Facility-Administered Medications  Medication Dose Route Frequency Provider Last Rate Last Dose  . 0.9 %  sodium chloride infusion   Intravenous Continuous Raylene Miyamoto, MD      . 0.9 %  sodium chloride infusion   Intravenous Continuous Colbert Coyer, MD      . amiodarone (PACERONE) tablet 200 mg  200 mg Oral Daily Hosie Poisson, MD   200 mg at 05/21/14 1113  . clopidogrel (PLAVIX) tablet 75 mg  75 mg Oral Q breakfast Josue Hector, MD   75 mg at 05/22/14 0700  . ferrous sulfate tablet 325 mg  325 mg Oral Q breakfast Hosie Poisson, MD   325 mg at 05/22/14 0700  . methimazole (TAPAZOLE) tablet 5 mg  5 mg Oral Q M,W,F Hosie Poisson, MD   5 mg at 05/21/14 1113  . ondansetron (ZOFRAN) tablet 4 mg  4 mg Oral Q6H PRN Hosie Poisson, MD       Or  . ondansetron (ZOFRAN) injection 4 mg  4 mg Intravenous Q6H PRN Hosie Poisson, MD      . pantoprazole (PROTONIX) EC tablet 40 mg  40 mg Oral BID Brand Males, MD   40 mg at 05/21/14 2119  . polyethylene glycol (MIRALAX / GLYCOLAX) packet 17 g  17 g Oral Daily Domenic Polite, MD      . tamsulosin (FLOMAX) capsule 0.4 mg  0.4 mg Oral QPM  Hosie Poisson, MD   0.4 mg at 05/21/14 1832    PE: General appearance: alert, cooperative and no distress Lungs: clear to auscultation bilaterally Heart: irregularly irregular rhythm Extremities: no LEE Pulses: 2+ and symmetric Skin: warm and dry Neurologic: Grossly normal  Lab Results:   Recent Labs  05/20/14 0422 05/21/14 0544 05/22/14 0533  WBC 7.4 6.6 5.6  HGB 7.0* 7.6* 7.6*  HCT 21.3* 23.3* 23.2*  PLT 79* 79* 101*   BMET  Recent Labs  05/19/14 1219 05/20/14 0422 05/21/14 0544  NA 143 144 144  K 4.0 3.9 4.0  CL 110 111 109  CO2 22 23 24   GLUCOSE 121* 116* 99  BUN 37* 33* 25*  CREATININE 0.90 0.87 0.91  CALCIUM 7.8* 7.9* 7.8*   PT/INR No results found for this basename: LABPROT, INR,  in the last 72 hours   Assessment/Plan    Active Problems:   ANEMIA, IRON DEFICIENCY   HYPERTENSION   CORONARY ARTERY DISEASE   Paroxysmal atrial fibrillation status post cryoablation   PERSONAL HX COLONIC POLYPS   GI bleed   Rectal bleed   Hypotension  1. CAD: s/p DES to proximal LAD 03/24/14. He denies CP. Continue monotherapy with Plavix. ASA has been discontinued.  2. GIB: s/p sucessful coil embolization of culprit vessel in distal sigmoid. No further bleeding.  3. Acute Anemia: s/p transfusion with 1 unit of PRBC on 05/20/14. Hgb remains stable since yesterday at 7.6. No further decreases.   4. History of AS: 30 day post op TAVR. 30 day f/u 2D echo was ordered by Dr. Burt Knack to be done inpatient. Results pending. Dr. Burt Knack to review.  His Hgb remains stable with no further decreases in the last 24 hrs. There appears to be no further GI bleeding. Continue mono antiplatelet therapy with Plavix for recently placed coronary DES. Home after 2D echo is completed.     LOS: 5 days    [name] 05/22/2014 8:02 AM    Patient seen and examined. Agree with assessment and plan. No chest pain. Breathing well. No bleeding. Hb stable without further drop.  Echo not yet done. I have called echo lab to expedite since for dc probably later today. Plt increased to 101k. Would f/u cbc as outpt; pt is on supplemental Fe   Troy Sine, MD, [facility] 05/22/2014 10:37 AM

## 2014-05-26 ENCOUNTER — Ambulatory Visit (INDEPENDENT_AMBULATORY_CARE_PROVIDER_SITE_OTHER): Payer: Medicare Other | Admitting: *Deleted

## 2014-05-26 ENCOUNTER — Encounter: Payer: Self-pay | Admitting: Interventional Cardiology

## 2014-05-26 ENCOUNTER — Ambulatory Visit (INDEPENDENT_AMBULATORY_CARE_PROVIDER_SITE_OTHER): Payer: Medicare Other | Admitting: Interventional Cardiology

## 2014-05-26 ENCOUNTER — Ambulatory Visit (HOSPITAL_COMMUNITY): Payer: Medicare Other | Attending: Cardiovascular Disease | Admitting: Cardiology

## 2014-05-26 VITALS — BP 122/70 | HR 71 | Ht 70.0 in | Wt 179.0 lb

## 2014-05-26 DIAGNOSIS — I2510 Atherosclerotic heart disease of native coronary artery without angina pectoris: Secondary | ICD-10-CM | POA: Insufficient documentation

## 2014-05-26 DIAGNOSIS — I808 Phlebitis and thrombophlebitis of other sites: Secondary | ICD-10-CM

## 2014-05-26 DIAGNOSIS — I482 Chronic atrial fibrillation, unspecified: Secondary | ICD-10-CM

## 2014-05-26 DIAGNOSIS — M7989 Other specified soft tissue disorders: Secondary | ICD-10-CM | POA: Insufficient documentation

## 2014-05-26 DIAGNOSIS — J4489 Other specified chronic obstructive pulmonary disease: Secondary | ICD-10-CM | POA: Insufficient documentation

## 2014-05-26 DIAGNOSIS — Z87891 Personal history of nicotine dependence: Secondary | ICD-10-CM | POA: Insufficient documentation

## 2014-05-26 DIAGNOSIS — Z8673 Personal history of transient ischemic attack (TIA), and cerebral infarction without residual deficits: Secondary | ICD-10-CM | POA: Insufficient documentation

## 2014-05-26 DIAGNOSIS — Z950 Presence of cardiac pacemaker: Secondary | ICD-10-CM

## 2014-05-26 DIAGNOSIS — I4891 Unspecified atrial fibrillation: Secondary | ICD-10-CM

## 2014-05-26 DIAGNOSIS — J449 Chronic obstructive pulmonary disease, unspecified: Secondary | ICD-10-CM | POA: Insufficient documentation

## 2014-05-26 DIAGNOSIS — Z954 Presence of other heart-valve replacement: Secondary | ICD-10-CM

## 2014-05-26 DIAGNOSIS — K625 Hemorrhage of anus and rectum: Secondary | ICD-10-CM

## 2014-05-26 DIAGNOSIS — R609 Edema, unspecified: Secondary | ICD-10-CM

## 2014-05-26 DIAGNOSIS — E785 Hyperlipidemia, unspecified: Secondary | ICD-10-CM | POA: Insufficient documentation

## 2014-05-26 DIAGNOSIS — I495 Sick sinus syndrome: Secondary | ICD-10-CM

## 2014-05-26 DIAGNOSIS — Z9861 Coronary angioplasty status: Secondary | ICD-10-CM

## 2014-05-26 DIAGNOSIS — I10 Essential (primary) hypertension: Secondary | ICD-10-CM | POA: Insufficient documentation

## 2014-05-26 DIAGNOSIS — Z952 Presence of prosthetic heart valve: Secondary | ICD-10-CM

## 2014-05-26 LAB — MDC_IDC_ENUM_SESS_TYPE_INCLINIC
Battery Impedance: 1478 Ohm
Brady Statistic AS VP Percent: 69 %
Brady Statistic AS VS Percent: 9 %
Lead Channel Impedance Value: 349 Ohm
Lead Channel Impedance Value: 559 Ohm
Lead Channel Pacing Threshold Amplitude: 0.5 V
Lead Channel Pacing Threshold Pulse Width: 0.4 ms
Lead Channel Pacing Threshold Pulse Width: 0.46 ms
Lead Channel Sensing Intrinsic Amplitude: 5.6 mV
Lead Channel Setting Pacing Amplitude: 2 V
Lead Channel Setting Sensing Sensitivity: 2.8 mV
MDC IDC MSMT BATTERY REMAINING LONGEVITY: 40 mo
MDC IDC MSMT BATTERY VOLTAGE: 2.76 V
MDC IDC MSMT LEADCHNL RA SENSING INTR AMPL: 1.4 mV
MDC IDC MSMT LEADCHNL RV PACING THRESHOLD AMPLITUDE: 0.75 V
MDC IDC SESS DTM: 20150720125255
MDC IDC SET LEADCHNL RV PACING AMPLITUDE: 2.5 V
MDC IDC SET LEADCHNL RV PACING PULSEWIDTH: 0.46 ms
MDC IDC STAT BRADY AP VP PERCENT: 22 %
MDC IDC STAT BRADY AP VS PERCENT: 0 %

## 2014-05-26 LAB — PACEMAKER DEVICE OBSERVATION

## 2014-05-26 NOTE — Patient Instructions (Signed)
Your physician has requested that you have a lower or upper extremity venous duplex TODAY. This test is an ultrasound of the veins in the legs or arms. It looks at venous blood flow that carries blood from the heart to the legs or arms. Allow one hour for a Lower Venous exam. Allow thirty minutes for an Upper Venous exam. There are no restrictions or special instructions.

## 2014-05-26 NOTE — Progress Notes (Signed)
Patient ID: [name] [name], male   DOB: [birth year], 80 y.o.   MRN: [id number]    [address], [place]  [address] Phone: [phone] Fax:  [phone]  Date:  05/26/2014   ID:  [name] [name], DOB September 07, 1929, MRN [id number]  PCP:  [name], MD   ASSESSMENT:  1. Swollen left arm, he is unable to tell me whether the arm was swollen when he left the hospital on 05/22/14. The variety of swelling he has appears to be related to an IV infiltration. Rule out DVT. 2. Recent rectal bleeding. No recurrence since discharge from the hospital 4 days ago 3. Aortic valve disease status post recent TAVR 4. CAD with recent DES 5. Transvenous pacemaker, left subclavian position  PLAN:  1. Urgent left upper extremity venous doppler and duplex to rule out DVT. Hopefully there is not evidence of DVT as he would be high risk for recurrent bleeding given his recent history 2. Would be high risk for bleeding if anticoagulation is necessary 3. Continue to observe left arm for evidence of infection 4. Local measures including left arm elevation when sitting and sleeping   SUBJECTIVE: [name] [name] is a 80 y.o. male who is in the office today for pacemaker followup and complained of left hand and forearm swelling. His been swollen since he got home from the hospital. He does not recall if it was swollen before he left the hospital. He denies dyspnea, angina, syncope, chills, fever, redness, and warmth in the left arm. He does note clear drainage from the medial aspect of the left arm.   Wt Readings from Last 3 Encounters:  05/26/14 179 lb (81.194 kg)  05/17/14 182 lb 12.2 oz (82.9 kg)  05/07/14 183 lb 3.2 oz (83.099 kg)     Past Medical History  Diagnosis Date  . GASTRIC POLYP 05/13/2009  . COLONIC POLYPS, ADENOMATOUS 01/14/2008  . HYPERLIPIDEMIA 06/01/2007  . ANEMIA, IRON DEFICIENCY 10/13/2008  . COAGULOPATHY, COUMADIN-INDUCED 01/14/2008  . ANXIETY 09/04/2008  . HYPERTENSION  06/01/2007  . ATRIAL FIBRILLATION, CHRONIC 01/14/2008    on warfarin since 2007  . UNSPECIFIED VENOUS INSUFFICIENCY 09/18/2008  . PLEURAL EFFUSION, LEFT 10/13/2008  . GERD 06/01/2007  . BARRETTS ESOPHAGUS 03/20/2009  . PROSTATE CANCER, HX OF 01/14/2008  . CATARACTS, BILATERAL, HX OF 01/14/2008  . CEREBROVASCULAR ACCIDENT, HX OF 08/18/2008  . PERSONAL HX COLONIC POLYPS 01/01/2010  . BASAL CELL CARCINOMA OF SKIN SITE UNSPECIFIED 09/28/2010  . Hyperglycemia   . Elevated PSA   . Restless leg syndrome   . Mitral regurgitation 06/13/2008    mitral valve replacement #20 St. Jude Biocor; Maze procedure by Dr. Amador Cunas  at [facility]  . CORONARY ARTERY DISEASE 06/01/2007    Cath 04/03/2008; Cath 11/01/1990  . Tachy-brady syndrome 07/13/2008    medtronic Adapta gen. model ADDR01 ,serial # [id number] H by Dr  [name] at [facility]  . Peripheral artery disease 06/16/2010    LEA doppler-left ABI nml 0.61 calcified vessels;right ABI  0.68  . Edema, peripheral 07/30/2009    LEV doppler- no thrombus or thrombophlebitis  . Aortic valvular disorder 08/16/2012    echo EF 45-50% LV mildly reduce,Biocor mitral valve,mild to mod. tricuspid regrug.,mild to mod. aortic regrug.  . Mitral valve disorder 09/08/2011    echo EF 50-55% LV normal  . Aortic valvular stenosis 11/19/2013  . Cardiomyopathy, ischemic 11/19/2013    EF 45-50% with inferior wall hypokinesis by echo 2014  . S/P  CABG x 1 06/13/2008    SVG to LAD with open vein harvest right thigh, LIMA harvested but not utilized by Dr Amador Cunas  . S/P mitral valve replacement with bioprosthetic valve 06/13/2008    26mm [employer] Jude Biocor porcine bioprosthetic tissue valve by Dr Amador Cunas  . S/P Maze operation for atrial fibrillation 06/13/2008    Partial left atrial lesion set using cryothermy for bilateral pulmonary vein isolation by Dr Amador Cunas  . Prosthetic valve dysfunction     Mitral stenosis involving bioprosthetic tissue valve placed 06/13/2008  . Mitral stenosis     Porcine  bioprosthetic tissue valve placed 06/13/2008  . Dysrhythmia     HX OF TACHY BRADY  . Pacemaker   . HYPERTHYROIDISM 01/06/2011  . 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 amiodarone (PACERONE) 200 MG tablet Take 1 tablet (200 mg total) by mouth daily.  90 tablet  3  . clopidogrel (PLAVIX) 75 MG tablet Take 75 mg by mouth daily with breakfast.      . ferrous sulfate 325 (65 FE) MG tablet Take 325 mg by mouth daily with breakfast.      . methimazole (TAPAZOLE) 5 MG tablet Take 5 mg by mouth every Monday, Wednesday, and Friday.       . metoprolol succinate (TOPROL-XL) 50 MG 24 hr tablet Take 1 tablet (50 mg total) by mouth daily. Take with or immediately following a meal.  30 tablet  5  . Multiple Vitamin (MULTIVITAMIN WITH MINERALS) TABS Take 1 tablet by mouth daily. Centrum Silver      . pantoprazole (PROTONIX) 40 MG tablet Take 40 mg by mouth 2 (two) times daily.      . simvastatin (ZOCOR) 40 MG tablet Take 1 tablet (40 mg total) by mouth at bedtime.  90 tablet  0  . Tamsulosin HCl (FLOMAX) 0.4 MG CAPS Take 0.4 mg by mouth every evening.        No current facility-administered medications for this visit.    Allergies:   No Known Allergies  Social History:  The patient  reports that he quit smoking about 48 years ago. His smoking use included Pipe. He has never used smokeless tobacco. He reports that he does not drink alcohol or use illicit drugs.   ROS:  Please see the history of present illness.   Recently had bright red blood per rectum. Had to have AV malformation/rectal vein embolized.   All other systems reviewed and negative.   OBJECTIVE: VS:  BP 122/70  Pulse 71  Ht 5\' 10"  (1.778 m)  Wt 179 lb (81.194 kg)  BMI 25.68 kg/m2 Well nourished, well developed, in no acute distress, elderly and  frail HEENT: normal Neck: JVD T-wave noted sitting upright. Carotid bruit absent  Cardiac:  normal S1, S2; IRR; no murmur Lungs:  clear to auscultation bilaterally, no wheezing, rhonchi or rales Abd: soft, nontender, no hepatomegaly Ext: Edema noted on the dorsum of the left hand and the medial aspect of the left forearm where there is drainage of serous/plasma like fluid. There is no warmth or erythema. There is no tenderness. The upper arm is not swollen.. Pulses 2+ and symmetric Skin: warm and dry Neuro:  CNs 2-12 intact, no focal abnormalities noted  EKG:  Not performed       Signed, Illene Labrador  III, MD 05/26/2014 10:36 AM

## 2014-05-26 NOTE — Progress Notes (Signed)
Left upper extremity venous duplex 

## 2014-05-27 NOTE — Progress Notes (Signed)
Pacemaker check in clinic. Normal device function. Thresholds, sensing, impedances consistent with previous measurements. Device programmed to maximize longevity. No mode switches. 3 VHRs---all on date of pt's TAVR. Device programmed at appropriate safety margins. Histogram distribution appropriate for patient activity level. Device programmed to optimize intrinsic conduction. Estimated longevity 3.70yrs. ROV w/ Dr. [name] in 23mo.

## 2014-06-02 ENCOUNTER — Encounter: Payer: Self-pay | Admitting: Cardiovascular Disease

## 2014-06-02 ENCOUNTER — Ambulatory Visit (INDEPENDENT_AMBULATORY_CARE_PROVIDER_SITE_OTHER): Payer: Medicare Other | Admitting: Cardiovascular Disease

## 2014-06-02 VITALS — BP 110/60 | HR 70 | Resp 24 | Ht 70.0 in | Wt 189.8 lb

## 2014-06-02 DIAGNOSIS — K5731 Diverticulosis of large intestine without perforation or abscess with bleeding: Secondary | ICD-10-CM

## 2014-06-02 DIAGNOSIS — Z953 Presence of xenogenic heart valve: Secondary | ICD-10-CM

## 2014-06-02 DIAGNOSIS — Z951 Presence of aortocoronary bypass graft: Secondary | ICD-10-CM

## 2014-06-02 DIAGNOSIS — Z954 Presence of other heart-valve replacement: Secondary | ICD-10-CM

## 2014-06-02 DIAGNOSIS — K5791 Diverticulosis of intestine, part unspecified, without perforation or abscess with bleeding: Secondary | ICD-10-CM

## 2014-06-02 DIAGNOSIS — I5031 Acute diastolic (congestive) heart failure: Secondary | ICD-10-CM

## 2014-06-02 DIAGNOSIS — Z9861 Coronary angioplasty status: Secondary | ICD-10-CM

## 2014-06-02 DIAGNOSIS — I2510 Atherosclerotic heart disease of native coronary artery without angina pectoris: Secondary | ICD-10-CM

## 2014-06-02 DIAGNOSIS — I509 Heart failure, unspecified: Secondary | ICD-10-CM

## 2014-06-02 DIAGNOSIS — Z952 Presence of prosthetic heart valve: Secondary | ICD-10-CM

## 2014-06-02 DIAGNOSIS — Z950 Presence of cardiac pacemaker: Secondary | ICD-10-CM

## 2014-06-02 LAB — BASIC METABOLIC PANEL WITH GFR
BUN: 20 mg/dL (ref 6–23)
CALCIUM: 8.5 mg/dL (ref 8.4–10.5)
CO2: 30 meq/L (ref 19–32)
CREATININE: 1.05 mg/dL (ref 0.50–1.35)
Chloride: 111 mEq/L (ref 96–112)
GFR, EST AFRICAN AMERICAN: 76 mL/min
GFR, Est Non African American: 65 mL/min
GLUCOSE: 94 mg/dL (ref 70–99)
Potassium: 4.5 mEq/L (ref 3.5–5.3)
Sodium: 144 mEq/L (ref 135–145)

## 2014-06-02 MED ORDER — POTASSIUM CHLORIDE CRYS ER 20 MEQ PO TBCR: 20.0000 meq | EXTENDED_RELEASE_TABLET | Freq: Every day | ORAL | Status: DC

## 2014-06-02 MED ORDER — FUROSEMIDE 40 MG PO TABS: 40.0000 mg | ORAL_TABLET | Freq: Every day | ORAL | Status: DC

## 2014-06-02 NOTE — Progress Notes (Signed)
Patient ID: [name] [name], male   DOB: 1929/09/10, 80 y.o.   MRN: [id number]     Reason for office visit Edema, dyspnea  After successfully undergoing rotational atherectomy and stenting of the LAD artery in May and then a trans-arterial aortic valve replacement on June 16, Mr. Tatar presented with bright red blood rectal bleeding on July 11, required several units of packed red blood cell transfusion and then had successful coil embolization for diverticular bleeding. Was discharged on Plavix monotherapy. He has not had any further bleeding.  On July 20 he was seen in the office by Dr. Tamala Julian and found to have left upper extremity swelling, subsequently confirmed by ultrasound to be related to cephalic vein thrombosis.   Pacemaker check on July 21 showed normal device function.  Today he presents with complaints of shortness of breath on exertion and edema. The edema is severe and extends all the way up to his genitals. He does not have orthopnea but has NYHA functional class III exertional dyspnea. According to our scale he now weighs about 18 pounds more than he did before his LAD stent.  His cardiac problems date back to 2009 when he was diagnosed with severe mitral insufficiency related to mitral valve prolapse. He was also having problems with paroxysmal atrial fibrillation. Coronary angiography demonstrated moderate coronary artery disease primarily a 70% calcified eccentric proximal LAD lesion with moderate stenoses in the other 2 major coronary arteries. He was referred for robotic mitral valve repair by Dr. Amador Cunas at [facility]. The LIMA was found to be a very small vessel. He received a saphenous vein graft bypass to the LAD. Cryoablation was performed for atrial fibrillation. The mitral valve was replaced with a 29 mm St. Jude Biocor prosthesis. The postoperative course was complicated by an embolic stroke from which she has recovered without sequelae. He received a dual-chamber permanent  pacemaker (Medtronic adapta) and is pacemaker dependent with 100% pacing in both the atrium and the ventricle. He has remained on amiodarone therapy with an extremely low burden of atrial fibrillation. As far as I can tell from the record the last documentation of atrial fibrillation was a one-hour episode that occurred in May of 2012. He also remains on warfarin therapy.  Subsequently, he has developed progressive CAD and aortic valve disease and underwent rotational atherectomy and stenting of the proximal LAD on 03/24/2014 (3 x 8 mm Xience Alpine drug-eluting stent) and then TAVR (29 mm Edwards Sapien) on April 22, 2014. Left ventricular systolic function is mildly depressed with an EF of 45-50% primarily due to RV pacing induced asynchrony. 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 This prevents imaging of his heart from transesophageal views  He has mild hyperthyroidism that is well controlled on low dose of methimazole. 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 He has gastroesophageal reflux disease and Barrett's esophagus. He has extensive sigmoid and transverse colonic diverticulosis.  No Known Allergies  Current Outpatient Prescriptions  Medication Sig Dispense Refill  . amiodarone (PACERONE) 200 MG tablet Take 1 tablet (200 mg total) by mouth daily.  90 tablet  3  . clopidogrel (PLAVIX) 75 MG tablet Take 75 mg by mouth daily with breakfast.      . ferrous sulfate 325 (65 FE) MG tablet Take 325 mg by mouth daily with breakfast.      . methimazole (TAPAZOLE) 5 MG tablet Take 5 mg by mouth every Monday, Wednesday, and Friday.       Marland Kitchen  metoprolol succinate (TOPROL-XL) 50 MG 24 hr tablet Take 1 tablet (50 mg total) by mouth daily. Take with or immediately following a meal.  30 tablet  5  . Multiple Vitamin (MULTIVITAMIN WITH MINERALS) TABS Take 1 tablet by mouth daily. Centrum Silver      . pantoprazole (PROTONIX) 40 MG tablet Take 40 mg by mouth 2  (two) times daily.      . simvastatin (ZOCOR) 40 MG tablet Take 1 tablet (40 mg total) by mouth at bedtime.  90 tablet  0  . Tamsulosin HCl (FLOMAX) 0.4 MG CAPS Take 0.4 mg by mouth every evening.       . furosemide (LASIX) 40 MG tablet Take 1 tablet (40 mg total) by mouth daily.  90 tablet  3  . potassium chloride SA (K-DUR,KLOR-CON) 20 MEQ tablet Take 1 tablet (20 mEq total) by mouth daily.  90 tablet  3   No current facility-administered medications for this visit.    Past Medical History  Diagnosis Date  . GASTRIC POLYP 05/13/2009  . COLONIC POLYPS, ADENOMATOUS 01/14/2008  . HYPERLIPIDEMIA 06/01/2007  . ANEMIA, IRON DEFICIENCY 10/13/2008  . COAGULOPATHY, COUMADIN-INDUCED 01/14/2008  . ANXIETY 09/04/2008  . HYPERTENSION 06/01/2007  . ATRIAL FIBRILLATION, CHRONIC 01/14/2008    on warfarin since 2007  . UNSPECIFIED VENOUS INSUFFICIENCY 09/18/2008  . PLEURAL EFFUSION, LEFT 10/13/2008  . GERD 06/01/2007  . BARRETTS ESOPHAGUS 03/20/2009  . PROSTATE CANCER, HX OF 01/14/2008  . CATARACTS, BILATERAL, HX OF 01/14/2008  . CEREBROVASCULAR ACCIDENT, HX OF 08/18/2008  . PERSONAL HX COLONIC POLYPS 01/01/2010  . BASAL CELL CARCINOMA OF SKIN SITE UNSPECIFIED 09/28/2010  . Hyperglycemia   . Elevated PSA   . Restless leg syndrome   . Mitral regurgitation 06/13/2008    mitral valve replacement #20 St. Jude Biocor; Maze procedure by Dr. Amador Cunas  at [facility] [place]  . CORONARY ARTERY DISEASE 06/01/2007    Cath 04/03/2008; Cath 11/01/1990  . Tachy-brady syndrome 07/13/2008    medtronic Adapta gen. model ADDR01 ,serial # [id number] H by Dr  [name] at [facility]  . Peripheral artery disease 06/16/2010    LEA doppler-left ABI nml 0.61 calcified vessels;right ABI  0.68  . Edema, peripheral 07/30/2009    LEV doppler- no thrombus or thrombophlebitis  . Aortic valvular disorder 08/16/2012    echo EF 45-50% LV mildly reduce,Biocor mitral valve,mild to mod. tricuspid regrug.,mild to mod. aortic regrug.  . Mitral valve disorder  09/08/2011    echo EF 50-55% LV normal  . Aortic valvular stenosis 11/19/2013  . Cardiomyopathy, ischemic 11/19/2013    EF 45-50% with inferior wall hypokinesis by echo 2014  . S/P CABG x 1 06/13/2008    SVG to LAD with open vein harvest right thigh, LIMA harvested but not utilized by Dr Amador Cunas  . S/P mitral valve replacement with bioprosthetic valve 06/13/2008    3mm St Jude Biocor porcine bioprosthetic tissue valve by Dr Amador Cunas  . S/P Maze operation for atrial fibrillation 06/13/2008    Partial left atrial lesion set using cryothermy for bilateral pulmonary vein isolation by Dr Amador Cunas  . Prosthetic valve dysfunction     Mitral stenosis involving bioprosthetic tissue valve placed 06/13/2008  . Mitral stenosis     Porcine bioprosthetic tissue valve placed 06/13/2008  . Dysrhythmia     HX OF TACHY BRADY  . Pacemaker   . HYPERTHYROIDISM 01/06/2011  . S/P TAVR (transcatheter aortic valve replacement) 04/22/2014    29 mm Edwards Sapien XT transcatheter heart valve  placed via open right transfemoral approach    Past Surgical History  Procedure Laterality Date  . Prostatectomy    . Tonsillectomy    . Cystectomy      Left leg  . Pacemaker placement  07/13/2008    [place] dual chamber-Medtronic at [facility]   . Coronary artery bypass graft  06/13/2008    graft x1 w/vein graft LAD artery by Dr. Amador Cunas at [facility]  . Mitral valve replacement  06/13/2008    #20 St. Jude Bicor mitral valve ;Maze procedure at [facility] by Dr. Amador Cunas  . Cardiac catheterization  11/01/1990  . Skin cancer excision      removed from forehead and right leg, nose/face  . Upper gastrointestinal endoscopy  07/14/2009    erosive reflux esopagitis, Barrett's esophagus  . Colonoscopy  07/14/2009    diverticulosis, internal hemorrhoids  . Cardiovascular stress test  06/03/2010    Persantine perfusion EF 45% mild to moderate ischemia basal and mid inferolateral region  . Cardiovascular stress test  01/01/2008    left ventricle normal,no  significant ischemia  . Cardiovascular stress test  09/09/2005     possibility of ischemia inferior wall  . Tee without cardioversion N/A 01/22/2014    Procedure: TRANSESOPHAGEAL ECHOCARDIOGRAM (TEE);  Surgeon: Sanda Klein, MD;  Location[facility] ENDOSCOPY;  Service: Cardiovascular;  Laterality: N/A;  . Coronary stent placement  03/24/2014    DES to LAD  . Transcatheter aortic valve replacement, transfemoral N/A 04/22/2014    Procedure: TRANSCATHETER AORTIC VALVE REPLACEMENT, TRANSFEMORAL;  Surgeon: [name], MD;  Location: [place];  Service: Open Heart Surgery;  Laterality: N/A;  TAVR-TF (RIGHT SIDE)  . Intraoperative transesophageal echocardiogram N/A 04/22/2014    Procedure: INTRAOPERATIVE TRANSTHORACIC ECHOCARDIOGRAM;  Surgeon: [name], MD;  Location: [place];  Service: Open Heart Surgery;  Laterality: N/A;    Family History  Problem Relation Age of Onset  . Cancer Mother     Breast Cancer    History   Social History  . Marital Status: Married    Spouse Name: N/A    Number of Children: 4  . Years of Education: N/A   Occupational History  . Retired    Social History Main Topics  . Smoking status: Former Smoker -- 2 years    Types: Pipe    Quit date: 11/07/1965  . Smokeless tobacco: Never Used     Comment: quit over 40 years ago, never smoked cigarettes  . Alcohol Use: No  . Drug Use: No  . Sexual Activity: Not on file   Other Topics Concern  . Not on file   Social History Narrative  . No narrative on file    Review of systems: Class III exertional dyspnea, edema extending from his feet all the way to his genital area, makes walking uncomfortable. Denies angina, palpitations, syncope, bleeding, abdominal pain, fever, chills, nausea or vomiting, change in urinary pattern, new focal neurological complaints, intermittent claudication, intolerance to heat or cold, cough, hemoptysis, pleurisy.  PHYSICAL EXAM BP 110/60  Pulse 70  Resp 24  Ht 5\' 10"  (1.778 m)  Wt  189 lb 12.8 oz (86.093 kg)  BMI 27.23 kg/m2  General: Alert, oriented x3, no distress Head: no evidence of trauma, PERRL, EOMI, no exophtalmos or lid lag, no myxedema, no xanthelasma; normal ears, nose and oropharynx Neck: The 10 cm elevation in jugular venous pulsations and prompt hepatojugular reflux; brisk carotid pulses without delay and no carotid bruits Chest: clear to auscultation, no signs of consolidation by  percussion or palpation, normal fremitus, symmetrical and full respiratory excursions, sternotomy scar Cardiovascular: normal position and quality of the apical impulse, regular rhythm, normal first and second heart sounds, no systolic murmurs, grade 4-1/3 decrescendo diastolic murmur at the right upper and right lower sternal border , no rubs or gallops Abdomen: no tenderness or distention, no masses by palpation, no abnormal pulsatility or arterial bruits, normal bowel sounds, large liver roughly 3-4 cm beneath the right costal margin Extremities: no clubbing, cyanosis; 3-4+edema the lower extremities in a symmetrical fashion all the way up to the scrotum; 1+ edema of the left upper extremity;  2+ radial, ulnar and brachial pulses bilaterally; 2+ right femoral, posterior tibial and dorsalis pedis pulses; 2+ left femoral, posterior tibial and dorsalis pedis pulses; no subclavian or femoral bruits Neurological: grossly nonfocal   Lipid Panel     Component Value Date/Time   CHOL 112 12/23/2013 0901   TRIG 62.0 12/23/2013 0901   HDL 41.50 12/23/2013 0901   CHOLHDL 3 12/23/2013 0901   VLDL 12.4 12/23/2013 0901   LDLCALC 58 12/23/2013 0901    BMET    Component Value Date/Time   NA 144 05/21/2014 0544   K 4.0 05/21/2014 0544   CL 109 05/21/2014 0544   CO2 24 05/21/2014 0544   GLUCOSE 99 05/21/2014 0544   BUN 25* 05/21/2014 0544   CREATININE 0.91 05/21/2014 0544   CREATININE 0.85 02/17/2014 1140   CALCIUM 7.8* 05/21/2014 0544   GFRNONAA 76* 05/21/2014 0544   GFRNONAA 81 02/17/2014 1140    GFRAA 88* 05/21/2014 0544   GFRAA >89 02/17/2014 1140   ECHO 05/05/14  Left ventricle: The cavity size was normal. There was mild concentric hypertrophy. Systolic function was normal. The estimated ejection fraction was in the range of 55% to 60%. Wall motion was normal; there were no regional wall motion abnormalities. The study is not technically sufficient to allow evaluation of LV diastolic function. - Ventricular septum: Septal motion showed paradox. - Aortic valve: A 29 mm Edwards Sapien XT THV is seated well. There is no central aortic insufficiency and only minimal to mild paravalvular leak. Peak and mean transaortic gradient are 11 and 8 mmHg, and are normal for this type of prosthesis. - Mitral valve: A 34mm Biocor porcine bioprosthesis is well seated, there is no central regurgitation or paravalvular leak. Transmitral gradients are mildly elevated. The findings are consistent with mild stenosis. Mean gradient (D): 5 mm Hg. - Left atrium: The atrium was severely dilated. - Right ventricle: Pacer wire or catheter noted in right ventricle. - Tricuspid valve: There was mild regurgitation. - Pulmonary arteries: Systolic pressure was mildly increased. PA peak pressure: 41 mm Hg (S).   ASSESSMENT AND PLAN  Mr. Vogel has clear evidence of hypervolemia, likely related to blood transfusion and intravenous fluid administration during his hospitalization for rectal bleeding. This explains his dyspnea and edema. He has normal renal function. Will start 40 mg daily and potassium chloride 20 mEq daily. Hopefully we'll see him lose weight at a rate of 1-2 pounds daily. He is to record his weight daily and have lab tests performed at the end of the week. We'll see him back in the office in 7-10 days.  No further GI bleeding.  Left upper extremity edema appears substantially better than reported, keep the left arm elevated. Avoid anticoagulants due to recent diverticular bleeding.  Continue  clopidogrel monotherapy for recently placed drug-eluting stents to the proximal LAD artery.  Very small perivalvular leak on  his recently placed aortic stent-valve. I don't think this has anything to do with his current episode of acute diastolic congestive heart failure.   Orders Placed This Encounter  Procedures  . BMP with Estimated GFR ([id number])   Meds ordered this encounter  Medications  . furosemide (LASIX) 40 MG tablet    Sig: Take 1 tablet (40 mg total) by mouth daily.    Dispense:  90 tablet    Refill:  3  . potassium chloride SA (K-DUR,KLOR-CON) 20 MEQ tablet    Sig: Take 1 tablet (20 mEq total) by mouth daily.    Dispense:  90 tablet    Refill:  [place] Lasharon Dunivan, MD, [facility] [phone] office [phone] pager

## 2014-06-02 NOTE — Patient Instructions (Signed)
Your physician recommends that you schedule a follow-up appointment in: NEXT [place]  START FUROSEMIDE 40 MG ONCE DAILY  START POTASSIUM 20 MEQ ONE TABLET ONCE DAILY  Your physician recommends that you return for lab work Friday THIS WEEK- 06-06-14  BRING WEIGHT TO THE OFFICE ON FRIDAY

## 2014-06-03 ENCOUNTER — Telehealth: Payer: Self-pay | Admitting: *Deleted

## 2014-06-03 DIAGNOSIS — Z79899 Other long term (current) drug therapy: Secondary | ICD-10-CM

## 2014-06-03 NOTE — Telephone Encounter (Signed)
Message copied by Raiford Simmonds on Tue Jun 03, 2014 12:16 PM ------      Message from: Sanda Klein      Created: Tue Jun 03, 2014  8:31 AM       Labs look great, but he was supposed to do them towards the end of the week, after diuretics. Please recheck next Monday ------

## 2014-06-03 NOTE — Telephone Encounter (Signed)
Spoke to patient. Result given . Verbalized understanding  patient states he will go next week to have lab rechecked

## 2014-06-05 NOTE — Discharge Summary (Addendum)
Physician Discharge Summary  [name] [name] PGF:[id number] DOB: [birth year] DOA: 05/17/2014  PCP: [name], MD  Admit date: 05/17/2014 Discharge date: 05/22/2014  Time spent: 35 minutes  Recommendations for Outpatient Follow-up:  1. PCP Dr.Ellison in 1 week, needs repeat CBC then 2. Dr.Cooper in 2 weeks  Discharge Diagnoses:    Diverticular bleed   ANEMIA, IRON DEFICIENCY   HYPERTENSION   CORONARY ARTERY DISEASE   Paroxysmal atrial fibrillation status post cryoablation   PERSONAL HX COLONIC POLYPS   GI bleed   Rectal bleed   Hypotension   AS s/p TAVR  Discharge Condition: stable  Diet recommendation: heart healthy  Filed Weights   05/17/14 1045 05/17/14 1615  Weight: 77.111 kg (170 lb) 82.9 kg (182 lb 12.2 oz)    History of present illness:  Chief Complaint: rectal bleeding since 3 am.  HPI: [name] [name] is a 80 y.o. male with prior h/o hypertension, colonic polyps, presented to the ER due to persistent rectal bleeding. He denied any abdominal pain or cramping. In ED, he was found to be hypotensive and his hemoglobin was 9. After he got 2 liters of luids his BP normalized and his hemoglobin dropped to 7.9   Hospital Course:  -GI bleed, Diverticular  -followed by GI and IR -Underwent Mesenteric angiography with embolization of distal sigmoid arcade vessel 7/11  -no further bleeding noted  Acute Blood loss anemia -s/p 4 units PRBC this admission  -Hb 7.6 on discharge, stable and asymptomatic -no further bleeding noted  -restarted on Plavix , advised to start Iron in 1 week   TAVR and PCI of the LAD:  -restarted on Plavix.  -30day ECHO completed per cards recommendations, poor quality but stable discharged home to FU with cards  Hypovolemic Shock, s/p acute GIB - Resolved   h/o thrombocytopenia  -due to consumption, improving  mild hypernatremia; resolved   Procedures: 7/11: Mesenteric angio with embolization of distal sigmoid arcade vessel  7/11    Consultations:  Cards  GI Dr.Mann  IR  Discharge Exam: Filed Vitals:   05/22/14 1500  BP: 130/64  Pulse: 70  Temp: 97.5 F (36.4 C)  Resp: 18    General: AAOx3 Cardiovascular: S1S2/RRR Respiratory: CTAB  Discharge Instructions You were cared for by a hospitalist during your hospital stay. If you have any questions about your discharge medications or the care you received while you were in the hospital after you are discharged, you can call the unit and asked to speak with the hospitalist on call if the hospitalist that took care of you is not available. Once you are discharged, your primary care physician will handle any further medical issues. Please note that NO REFILLS for any discharge medications will be authorized once you are discharged, as it is imperative that you return to your primary care physician (or establish a relationship with a primary care physician if you do not have one) for your aftercare needs so that they can reassess your need for medications and monitor your lab values.  Discharge Instructions   Diet - low sodium heart healthy    Complete by:  As directed      Increase activity slowly    Complete by:  As directed             Medication List    STOP taking these medications       aspirin EC 81 MG tablet      TAKE these medications  amiodarone 200 MG tablet  Commonly known as:  PACERONE  Take 1 tablet (200 mg total) by mouth daily.     clopidogrel 75 MG tablet  Commonly known as:  PLAVIX  Take 75 mg by mouth daily with breakfast.     ferrous sulfate 325 (65 FE) MG tablet  Take 325 mg by mouth daily with breakfast.     methimazole 5 MG tablet  Commonly known as:  TAPAZOLE  Take 5 mg by mouth every Monday, Wednesday, and Friday.     metoprolol succinate 50 MG 24 hr tablet  Commonly known as:  TOPROL-XL  Take 1 tablet (50 mg total) by mouth daily. Take with or immediately following a meal.     multivitamin with  minerals Tabs tablet  Take 1 tablet by mouth daily. Centrum Silver     pantoprazole 40 MG tablet  Commonly known as:  PROTONIX  Take 40 mg by mouth 2 (two) times daily.     simvastatin 40 MG tablet  Commonly known as:  ZOCOR  Take 1 tablet (40 mg total) by mouth at bedtime.     tamsulosin 0.4 MG Caps capsule  Commonly known as:  FLOMAX  Take 0.4 mg by mouth every evening.       No Known Allergies     Follow-up Information   Follow up with [name], MD. Schedule an appointment as soon as possible for a visit in 1 week.   Specialty:  Endocrinology   Contact information:   [address] [place] [address] [phone]       Follow up with [name], MD. Schedule an appointment as soon as possible for a visit in 2 weeks.   Specialty:  Cardiology   Contact information:   [address] [place]  [address] [phone]       Follow up with Lab-CBC In 1 week.       The results of significant diagnostics from this hospitalization (including imaging, microbiology, ancillary and laboratory) are listed below for reference.    Significant Diagnostic Studies: Nm Gi Blood Loss  05/17/2014   CLINICAL DATA:  History of recent tab are procedure, currently on Plavix and aspirin, now with 1 day history of acute rectal bleeding, anemia, hypertension  EXAM: NUCLEAR MEDICINE GASTROINTESTINAL BLEEDING SCAN  TECHNIQUE: Sequential abdominal images were obtained following intravenous administration of Tc-68mlabeled red blood cells.  RADIOPHARMACEUTICALS:  25 mCi Tc-977mn-vitro labeled red cells.  COMPARISON:  CTA of the chest, abdomen and pelvis - 02/21/2014  FINDINGS: The initial anterior projections scintigraphic image demonstrates abnormal radiotracer activity overlying the left mid hemi abdomen which persists and transits both retrograde and anterograde within the descending and more distal sigmoid colon on the acquired subsequent images.  There  is homogeneous distribution of radiotracer seen within the liver, heart an intra-abdominal vascular structures.  IMPRESSION: Examination is positive acute lower GI bleed, likely originating within the sigmoid colon of the left lower abdomen.  Above findings were discussed with Dr. MaCollene Marest the time of procedure completion and the patient proceeded to the interventional radiology department for a mesenteric arteriogram and embolization.   Electronically Signed   By: JoSandi Mariscal.D.   On: 05/17/2014 22:56   Ir Angiogram Visceral Selective  05/18/2014   INDICATION: History of recent TAVR procedure, with subsequent initiation of Plavix and aspirin, now admitted with acute lower GI bleed with nuclear medicine tagged red blood cell scan compatible with acute  lower GI bleed within the sigmoid colon of the left lower abdomen.  EXAM: 1. ULTRASOUND GUIDANCE FOR ARTERIAL ACCESS 2. SUPERIOR AND INFERIOR MESENTERIC ARTERIOGRAMS 3. SUB SELECTIVE SIGMOID ARTERIOGRAM (3rd ORDER) 4. SUB SELECTIVE SIGMOID ARCADE ARTERIOGRAM (3rd ORDER) AND PERCUTANEOUS COIL EMBOLIZATION  COMPARISON:  Nuclear medicine tagged bleeding scan - 05/17/2014; CTA of the chest, abdomen pelvis - 02/21/2014  MEDICATIONS: Fentanyl 100 mcg IV; Versed 3 mg IV  CONTRAST:  137m OMNIPAQUE IOHEXOL 300 MG/ML  SOLN  FLUOROSCOPY TIME:  31 minutes 6 seconds.  ACCESS: Left common femoral artery; hemostasis achieved with the deployment of an Exoseal device and manual compression  COMPLICATIONS: None immediate  TECHNIQUE: Informed written consent was obtained from the patient and the patient's white after a discussion of the risks, benefits and alternatives to treatment. Questions regarding the procedure were encouraged and answered. A timeout was performed prior to the initiation of the procedure.  Given the presence of a healing incision at the patient's right groin secondary to recent TAVR procedure, the left groin was selected for arterial access. As such, the left  groin was prepped and draped in the usual sterile fashion, and a sterile drape was applied covering the operative field. Maximum barrier sterile technique with sterile gowns and gloves were used for the procedure. A timeout was performed prior to the initiation of the procedure. Local anesthesia was provided with 1% lidocaine.  The left femoral head was marked fluoroscopically. Under ultrasound guidance, the left common femoral artery was accessed with a micropuncture kit after the overlying soft tissues were anesthetized with 1% lidocaine. An ultrasound image was saved for documentation purposes. The micropuncture sheath was exchanged for a 5 [place] sheath over a Bentson wire. A closure arteriogram was performed through the side of the sheath confirming access within the right common femoral artery.  Over a Bentson wire, a Mickelson catheter was advanced to the level of the thoracic aorta where it was back bled and flushed. The catheter was then utilized to select the SMA and several superior mesenteric arteriograms were performed in various obliquities.  The Mickelson catheter was then utilized to select the IMA and several inferior mesenteric arteriograms were performed in various obliquities demonstrating an ill-defined area of active extravasation originating from a distal sigmoid arcade vessel compatible with the findings on preceding nuclear medicine tagged will blood cell scan.  With the use of a Synchro 14 soft tip micro wire, a regular Renegade micro catheter was advanced to the level of the sigmoid artery and a selective sigmoid arteriogram was performed. The micro catheter was then utilized to select the providing arcade vessel and a sub selective sigmoid arcade arteriogram was performed.  Prolonged efforts were made to sub select the caudal division of this distal sigmoid arcade supplying the area of active extravasation however this initially proved unsuccessful. As such, the cranial division  of this distal sigmoid arcade was percutaneously coil embolized with multiple overlapping pushable 2 mm diameter coils to the origin of the caudal tributary. With the dominant cranial division now embolized, the caudal division providing the area of active extravasation was then successfully cannulated and percutaneously coil embolized with multiple overlapping 2 mm diameter interlock coils to the level of the origin of the sigmoid arcade vessel. The micro catheter was retracted into the sigmoid artery and a completion sigmoid arteriogram was performed.  At this point, the procedure was terminated. All wires catheters and sheaths were removed from the patient. Hemostasis was achieved at the left groin  access site with deployment of an Exoseal device and manual compression. A dressing was placed. The patient tolerated the procedure well without immediate postprocedural complication.  FINDINGS: Multiple superior mesenteric arteriograms were performed centering over the area of active extravasation seen on preceding nuclear medicine tagged red blood cell study, though was negative for discrete area of extravasation or vessel irregularity.  As such, multiple inferior mesenteric arteriograms were performed ultimately demonstrating an ill-defined area of active extravasation within the left lower abdomen correlating with the area of suspected bleeding seen on preceding tagged nuclear medicine red blood cell scan.  Selective sigmoid arteriogram confirmed an area of ill-defined active extravasation within the sigmoid colon of the left lower abdomen supplied by a distal sigmoid arcade vessel. This distal arcade vessel was noted to have proximal bifurcation with the caudal division supplying the area of active extravasation. Prolonged efforts were made to sub selective this caudal division however this proved difficult secondary to tortuosity of the division's origin. As such, the dominant cranial division was selected and  percutaneously coil embolized with multiple overlapping 2 mm diameter coils. Ultimately, the supplying caudal division was then able to be selected and was successfully percutaneous coil embolized with two overlapping 2 mm diameter interlock coils to the level of the supplying arcade vessel's origin.  Completion sigmoid arteriogram was negative for any residual area of active extravasation.  IMPRESSION: Technically successful percutaneous coil embolization of a distal sigmoid arcade vessel for active extravasation correlating with the area of active GI bleeding seen on preceding nuclear medicine tagged with blood cell scan.   Electronically Signed   By: Sandi Mariscal M.D.   On: 05/18/2014 09:40   Ir Angiogram Visceral Selective  05/18/2014   INDICATION: History of recent TAVR procedure, with subsequent initiation of Plavix and aspirin, now admitted with acute lower GI bleed with nuclear medicine tagged red blood cell scan compatible with acute lower GI bleed within the sigmoid colon of the left lower abdomen.  EXAM: 1. ULTRASOUND GUIDANCE FOR ARTERIAL ACCESS 2. SUPERIOR AND INFERIOR MESENTERIC ARTERIOGRAMS 3. SUB SELECTIVE SIGMOID ARTERIOGRAM (3rd ORDER) 4. SUB SELECTIVE SIGMOID ARCADE ARTERIOGRAM (3rd ORDER) AND PERCUTANEOUS COIL EMBOLIZATION  COMPARISON:  Nuclear medicine tagged bleeding scan - 05/17/2014; CTA of the chest, abdomen pelvis - 02/21/2014  MEDICATIONS: Fentanyl 100 mcg IV; Versed 3 mg IV  CONTRAST:  180m OMNIPAQUE IOHEXOL 300 MG/ML  SOLN  FLUOROSCOPY TIME:  31 minutes 6 seconds.  ACCESS: Left common femoral artery; hemostasis achieved with the deployment of an Exoseal device and manual compression  COMPLICATIONS: None immediate  TECHNIQUE: Informed written consent was obtained from the patient and the patient's white after a discussion of the risks, benefits and alternatives to treatment. Questions regarding the procedure were encouraged and answered. A timeout was performed prior to the initiation  of the procedure.  Given the presence of a healing incision at the patient's right groin secondary to recent TAVR procedure, the left groin was selected for arterial access. As such, the left groin was prepped and draped in the usual sterile fashion, and a sterile drape was applied covering the operative field. Maximum barrier sterile technique with sterile gowns and gloves were used for the procedure. A timeout was performed prior to the initiation of the procedure. Local anesthesia was provided with 1% lidocaine.  The left femoral head was marked fluoroscopically. Under ultrasound guidance, the left common femoral artery was accessed with a micropuncture kit after the overlying soft tissues were anesthetized with 1% lidocaine. An ultrasound  image was saved for documentation purposes. The micropuncture sheath was exchanged for a 5 [place] vascular sheath over a Bentson wire. A closure arteriogram was performed through the side of the sheath confirming access within the right common femoral artery.  Over a Bentson wire, a Mickelson catheter was advanced to the level of the thoracic aorta where it was back bled and flushed. The catheter was then utilized to select the SMA and several superior mesenteric arteriograms were performed in various obliquities.  The Mickelson catheter was then utilized to select the IMA and several inferior mesenteric arteriograms were performed in various obliquities demonstrating an ill-defined area of active extravasation originating from a distal sigmoid arcade vessel compatible with the findings on preceding nuclear medicine tagged will blood cell scan.  With the use of a Synchro 14 soft tip micro wire, a regular Renegade micro catheter was advanced to the level of the sigmoid artery and a selective sigmoid arteriogram was performed. The micro catheter was then utilized to select the providing arcade vessel and a sub selective sigmoid arcade arteriogram was performed.  Prolonged efforts  were made to sub select the caudal division of this distal sigmoid arcade supplying the area of active extravasation however this initially proved unsuccessful. As such, the cranial division of this distal sigmoid arcade was percutaneously coil embolized with multiple overlapping pushable 2 mm diameter coils to the origin of the caudal tributary. With the dominant cranial division now embolized, the caudal division providing the area of active extravasation was then successfully cannulated and percutaneously coil embolized with multiple overlapping 2 mm diameter interlock coils to the level of the origin of the sigmoid arcade vessel. The micro catheter was retracted into the sigmoid artery and a completion sigmoid arteriogram was performed.  At this point, the procedure was terminated. All wires catheters and sheaths were removed from the patient. Hemostasis was achieved at the left groin access site with deployment of an Exoseal device and manual compression. A dressing was placed. The patient tolerated the procedure well without immediate postprocedural complication.  FINDINGS: Multiple superior mesenteric arteriograms were performed centering over the area of active extravasation seen on preceding nuclear medicine tagged red blood cell study, though was negative for discrete area of extravasation or vessel irregularity.  As such, multiple inferior mesenteric arteriograms were performed ultimately demonstrating an ill-defined area of active extravasation within the left lower abdomen correlating with the area of suspected bleeding seen on preceding tagged nuclear medicine red blood cell scan.  Selective sigmoid arteriogram confirmed an area of ill-defined active extravasation within the sigmoid colon of the left lower abdomen supplied by a distal sigmoid arcade vessel. This distal arcade vessel was noted to have proximal bifurcation with the caudal division supplying the area of active extravasation. Prolonged  efforts were made to sub selective this caudal division however this proved difficult secondary to tortuosity of the division's origin. As such, the dominant cranial division was selected and percutaneously coil embolized with multiple overlapping 2 mm diameter coils. Ultimately, the supplying caudal division was then able to be selected and was successfully percutaneous coil embolized with two overlapping 2 mm diameter interlock coils to the level of the supplying arcade vessel's origin.  Completion sigmoid arteriogram was negative for any residual area of active extravasation.  IMPRESSION: Technically successful percutaneous coil embolization of a distal sigmoid arcade vessel for active extravasation correlating with the area of active GI bleeding seen on preceding nuclear medicine tagged with blood cell scan.   Electronically Signed  By: Sandi Mariscal M.D.   On: 05/18/2014 09:40   Ir Angiogram Selective Each Additional Vessel  05/21/2014   INDICATION: History of recent TAVR procedure, with subsequent initiation of Plavix and aspirin, now admitted with acute lower GI bleed with nuclear medicine tagged red blood cell scan compatible with acute lower GI bleed within the sigmoid colon of the left lower abdomen.  EXAM: 1. ULTRASOUND GUIDANCE FOR ARTERIAL ACCESS 2. SUPERIOR AND INFERIOR MESENTERIC ARTERIOGRAMS 3. SUB SELECTIVE SIGMOID ARTERIOGRAM (3rd ORDER) 4. SUB SELECTIVE SIGMOID ARCADE ARTERIOGRAM (3rd ORDER) AND PERCUTANEOUS COIL EMBOLIZATION  COMPARISON:  Nuclear medicine tagged bleeding scan - 05/17/2014; CTA of the chest, abdomen pelvis - 02/21/2014  MEDICATIONS: Fentanyl 100 mcg IV; Versed 3 mg IV  CONTRAST:  179m OMNIPAQUE IOHEXOL 300 MG/ML  SOLN  FLUOROSCOPY TIME:  31 minutes 6 seconds.  ACCESS: Left common femoral artery; hemostasis achieved with the deployment of an Exoseal device and manual compression  COMPLICATIONS: None immediate  TECHNIQUE: Informed written consent was obtained from the patient and  the patient's white after a discussion of the risks, benefits and alternatives to treatment. Questions regarding the procedure were encouraged and answered. A timeout was performed prior to the initiation of the procedure.  Given the presence of a healing incision at the patient's right groin secondary to recent TAVR procedure, the left groin was selected for arterial access. As such, the left groin was prepped and draped in the usual sterile fashion, and a sterile drape was applied covering the operative field. Maximum barrier sterile technique with sterile gowns and gloves were used for the procedure. A timeout was performed prior to the initiation of the procedure. Local anesthesia was provided with 1% lidocaine.  The left femoral head was marked fluoroscopically. Under ultrasound guidance, the left common femoral artery was accessed with a micropuncture kit after the overlying soft tissues were anesthetized with 1% lidocaine. An ultrasound image was saved for documentation purposes. The micropuncture sheath was exchanged for a 5 [place] sheath over a Bentson wire. A closure arteriogram was performed through the side of the sheath confirming access within the right common femoral artery.  Over a Bentson wire, a Mickelson catheter was advanced to the level of the thoracic aorta where it was back bled and flushed. The catheter was then utilized to select the SMA and several superior mesenteric arteriograms were performed in various obliquities.  The Mickelson catheter was then utilized to select the IMA and several inferior mesenteric arteriograms were performed in various obliquities demonstrating an ill-defined area of active extravasation originating from a distal sigmoid arcade vessel compatible with the findings on preceding nuclear medicine tagged will blood cell scan.  With the use of a Synchro 14 soft tip micro wire, a regular Renegade micro catheter was advanced to the level of the sigmoid artery  and a selective sigmoid arteriogram was performed. The micro catheter was then utilized to select the providing arcade vessel and a sub selective sigmoid arcade arteriogram was performed.  Prolonged efforts were made to sub select the caudal division of this distal sigmoid arcade supplying the area of active extravasation however this initially proved unsuccessful. As such, the cranial division of this distal sigmoid arcade was percutaneously coil embolized with multiple overlapping pushable 2 mm diameter coils to the origin of the caudal tributary. With the dominant cranial division now embolized, the caudal division providing the area of active extravasation was then successfully cannulated and percutaneously coil embolized with multiple overlapping 2 mm diameter interlock  coils to the level of the origin of the sigmoid arcade vessel. The micro catheter was retracted into the sigmoid artery and a completion sigmoid arteriogram was performed.  At this point, the procedure was terminated. All wires catheters and sheaths were removed from the patient. Hemostasis was achieved at the left groin access site with deployment of an Exoseal device and manual compression. A dressing was placed. The patient tolerated the procedure well without immediate postprocedural complication.  FINDINGS: Multiple superior mesenteric arteriograms were performed centering over the area of active extravasation seen on preceding nuclear medicine tagged red blood cell study, though was negative for discrete area of extravasation or vessel irregularity.  As such, multiple inferior mesenteric arteriograms were performed ultimately demonstrating an ill-defined area of active extravasation within the left lower abdomen correlating with the area of suspected bleeding seen on preceding tagged nuclear medicine red blood cell scan.  Selective sigmoid arteriogram confirmed an area of ill-defined active extravasation within the sigmoid colon of the  left lower abdomen supplied by a distal sigmoid arcade vessel. This distal arcade vessel was noted to have proximal bifurcation with the caudal division supplying the area of active extravasation. Prolonged efforts were made to sub selective this caudal division however this proved difficult secondary to tortuosity of the division's origin. As such, the dominant cranial division was selected and percutaneously coil embolized with multiple overlapping 2 mm diameter coils. Ultimately, the supplying caudal division was then able to be selected and was successfully percutaneous coil embolized with two overlapping 2 mm diameter interlock coils to the level of the supplying arcade vessel's origin.  Completion sigmoid arteriogram was negative for any residual area of active extravasation.  IMPRESSION: Technically successful percutaneous coil embolization of a distal sigmoid arcade vessel for active extravasation correlating with the area of active GI bleeding seen on preceding nuclear medicine tagged with blood cell scan.   Electronically Signed   By: Sandi Mariscal M.D.   On: 05/18/2014 09:40   Ir Angiogram Selective Each Additional Vessel  05/21/2014   INDICATION: History of recent TAVR procedure, with subsequent initiation of Plavix and aspirin, now admitted with acute lower GI bleed with nuclear medicine tagged red blood cell scan compatible with acute lower GI bleed within the sigmoid colon of the left lower abdomen.  EXAM: 1. ULTRASOUND GUIDANCE FOR ARTERIAL ACCESS 2. SUPERIOR AND INFERIOR MESENTERIC ARTERIOGRAMS 3. SUB SELECTIVE SIGMOID ARTERIOGRAM (3rd ORDER) 4. SUB SELECTIVE SIGMOID ARCADE ARTERIOGRAM (3rd ORDER) AND PERCUTANEOUS COIL EMBOLIZATION  COMPARISON:  Nuclear medicine tagged bleeding scan - 05/17/2014; CTA of the chest, abdomen pelvis - 02/21/2014  MEDICATIONS: Fentanyl 100 mcg IV; Versed 3 mg IV  CONTRAST:  155m OMNIPAQUE IOHEXOL 300 MG/ML  SOLN  FLUOROSCOPY TIME:  31 minutes 6 seconds.  ACCESS: Left  common femoral artery; hemostasis achieved with the deployment of an Exoseal device and manual compression  COMPLICATIONS: None immediate  TECHNIQUE: Informed written consent was obtained from the patient and the patient's white after a discussion of the risks, benefits and alternatives to treatment. Questions regarding the procedure were encouraged and answered. A timeout was performed prior to the initiation of the procedure.  Given the presence of a healing incision at the patient's right groin secondary to recent TAVR procedure, the left groin was selected for arterial access. As such, the left groin was prepped and draped in the usual sterile fashion, and a sterile drape was applied covering the operative field. Maximum barrier sterile technique with sterile gowns and gloves were used  for the procedure. A timeout was performed prior to the initiation of the procedure. Local anesthesia was provided with 1% lidocaine.  The left femoral head was marked fluoroscopically. Under ultrasound guidance, the left common femoral artery was accessed with a micropuncture kit after the overlying soft tissues were anesthetized with 1% lidocaine. An ultrasound image was saved for documentation purposes. The micropuncture sheath was exchanged for a 5 [place] vascular sheath over a Bentson wire. A closure arteriogram was performed through the side of the sheath confirming access within the right common femoral artery.  Over a Bentson wire, a Mickelson catheter was advanced to the level of the thoracic aorta where it was back bled and flushed. The catheter was then utilized to select the SMA and several superior mesenteric arteriograms were performed in various obliquities.  The Mickelson catheter was then utilized to select the IMA and several inferior mesenteric arteriograms were performed in various obliquities demonstrating an ill-defined area of active extravasation originating from a distal sigmoid arcade vessel compatible  with the findings on preceding nuclear medicine tagged will blood cell scan.  With the use of a Synchro 14 soft tip micro wire, a regular Renegade micro catheter was advanced to the level of the sigmoid artery and a selective sigmoid arteriogram was performed. The micro catheter was then utilized to select the providing arcade vessel and a sub selective sigmoid arcade arteriogram was performed.  Prolonged efforts were made to sub select the caudal division of this distal sigmoid arcade supplying the area of active extravasation however this initially proved unsuccessful. As such, the cranial division of this distal sigmoid arcade was percutaneously coil embolized with multiple overlapping pushable 2 mm diameter coils to the origin of the caudal tributary. With the dominant cranial division now embolized, the caudal division providing the area of active extravasation was then successfully cannulated and percutaneously coil embolized with multiple overlapping 2 mm diameter interlock coils to the level of the origin of the sigmoid arcade vessel. The micro catheter was retracted into the sigmoid artery and a completion sigmoid arteriogram was performed.  At this point, the procedure was terminated. All wires catheters and sheaths were removed from the patient. Hemostasis was achieved at the left groin access site with deployment of an Exoseal device and manual compression. A dressing was placed. The patient tolerated the procedure well without immediate postprocedural complication.  FINDINGS: Multiple superior mesenteric arteriograms were performed centering over the area of active extravasation seen on preceding nuclear medicine tagged red blood cell study, though was negative for discrete area of extravasation or vessel irregularity.  As such, multiple inferior mesenteric arteriograms were performed ultimately demonstrating an ill-defined area of active extravasation within the left lower abdomen correlating with the  area of suspected bleeding seen on preceding tagged nuclear medicine red blood cell scan.  Selective sigmoid arteriogram confirmed an area of ill-defined active extravasation within the sigmoid colon of the left lower abdomen supplied by a distal sigmoid arcade vessel. This distal arcade vessel was noted to have proximal bifurcation with the caudal division supplying the area of active extravasation. Prolonged efforts were made to sub selective this caudal division however this proved difficult secondary to tortuosity of the division's origin. As such, the dominant cranial division was selected and percutaneously coil embolized with multiple overlapping 2 mm diameter coils. Ultimately, the supplying caudal division was then able to be selected and was successfully percutaneous coil embolized with two overlapping 2 mm diameter interlock coils to the level of the supplying  arcade vessel's origin.  Completion sigmoid arteriogram was negative for any residual area of active extravasation.  IMPRESSION: Technically successful percutaneous coil embolization of a distal sigmoid arcade vessel for active extravasation correlating with the area of active GI bleeding seen on preceding nuclear medicine tagged with blood cell scan.   Electronically Signed   By: Sandi Mariscal M.D.   On: 05/18/2014 09:40   Ir Angiogram Follow Up Study  05/18/2014   INDICATION: History of recent TAVR procedure, with subsequent initiation of Plavix and aspirin, now admitted with acute lower GI bleed with nuclear medicine tagged red blood cell scan compatible with acute lower GI bleed within the sigmoid colon of the left lower abdomen.  EXAM: 1. ULTRASOUND GUIDANCE FOR ARTERIAL ACCESS 2. SUPERIOR AND INFERIOR MESENTERIC ARTERIOGRAMS 3. SUB SELECTIVE SIGMOID ARTERIOGRAM (3rd ORDER) 4. SUB SELECTIVE SIGMOID ARCADE ARTERIOGRAM (3rd ORDER) AND PERCUTANEOUS COIL EMBOLIZATION  COMPARISON:  Nuclear medicine tagged bleeding scan - 05/17/2014; CTA of the  chest, abdomen pelvis - 02/21/2014  MEDICATIONS: Fentanyl 100 mcg IV; Versed 3 mg IV  CONTRAST:  169m OMNIPAQUE IOHEXOL 300 MG/ML  SOLN  FLUOROSCOPY TIME:  31 minutes 6 seconds.  ACCESS: Left common femoral artery; hemostasis achieved with the deployment of an Exoseal device and manual compression  COMPLICATIONS: None immediate  TECHNIQUE: Informed written consent was obtained from the patient and the patient's white after a discussion of the risks, benefits and alternatives to treatment. Questions regarding the procedure were encouraged and answered. A timeout was performed prior to the initiation of the procedure.  Given the presence of a healing incision at the patient's right groin secondary to recent TAVR procedure, the left groin was selected for arterial access. As such, the left groin was prepped and draped in the usual sterile fashion, and a sterile drape was applied covering the operative field. Maximum barrier sterile technique with sterile gowns and gloves were used for the procedure. A timeout was performed prior to the initiation of the procedure. Local anesthesia was provided with 1% lidocaine.  The left femoral head was marked fluoroscopically. Under ultrasound guidance, the left common femoral artery was accessed with a micropuncture kit after the overlying soft tissues were anesthetized with 1% lidocaine. An ultrasound image was saved for documentation purposes. The micropuncture sheath was exchanged for a 5 [place] sheath over a Bentson wire. A closure arteriogram was performed through the side of the sheath confirming access within the right common femoral artery.  Over a Bentson wire, a Mickelson catheter was advanced to the level of the thoracic aorta where it was back bled and flushed. The catheter was then utilized to select the SMA and several superior mesenteric arteriograms were performed in various obliquities.  The Mickelson catheter was then utilized to select the IMA and  several inferior mesenteric arteriograms were performed in various obliquities demonstrating an ill-defined area of active extravasation originating from a distal sigmoid arcade vessel compatible with the findings on preceding nuclear medicine tagged will blood cell scan.  With the use of a Synchro 14 soft tip micro wire, a regular Renegade micro catheter was advanced to the level of the sigmoid artery and a selective sigmoid arteriogram was performed. The micro catheter was then utilized to select the providing arcade vessel and a sub selective sigmoid arcade arteriogram was performed.  Prolonged efforts were made to sub select the caudal division of this distal sigmoid arcade supplying the area of active extravasation however this initially proved unsuccessful. As such, the cranial division  of this distal sigmoid arcade was percutaneously coil embolized with multiple overlapping pushable 2 mm diameter coils to the origin of the caudal tributary. With the dominant cranial division now embolized, the caudal division providing the area of active extravasation was then successfully cannulated and percutaneously coil embolized with multiple overlapping 2 mm diameter interlock coils to the level of the origin of the sigmoid arcade vessel. The micro catheter was retracted into the sigmoid artery and a completion sigmoid arteriogram was performed.  At this point, the procedure was terminated. All wires catheters and sheaths were removed from the patient. Hemostasis was achieved at the left groin access site with deployment of an Exoseal device and manual compression. A dressing was placed. The patient tolerated the procedure well without immediate postprocedural complication.  FINDINGS: Multiple superior mesenteric arteriograms were performed centering over the area of active extravasation seen on preceding nuclear medicine tagged red blood cell study, though was negative for discrete area of extravasation or vessel  irregularity.  As such, multiple inferior mesenteric arteriograms were performed ultimately demonstrating an ill-defined area of active extravasation within the left lower abdomen correlating with the area of suspected bleeding seen on preceding tagged nuclear medicine red blood cell scan.  Selective sigmoid arteriogram confirmed an area of ill-defined active extravasation within the sigmoid colon of the left lower abdomen supplied by a distal sigmoid arcade vessel. This distal arcade vessel was noted to have proximal bifurcation with the caudal division supplying the area of active extravasation. Prolonged efforts were made to sub selective this caudal division however this proved difficult secondary to tortuosity of the division's origin. As such, the dominant cranial division was selected and percutaneously coil embolized with multiple overlapping 2 mm diameter coils. Ultimately, the supplying caudal division was then able to be selected and was successfully percutaneous coil embolized with two overlapping 2 mm diameter interlock coils to the level of the supplying arcade vessel's origin.  Completion sigmoid arteriogram was negative for any residual area of active extravasation.  IMPRESSION: Technically successful percutaneous coil embolization of a distal sigmoid arcade vessel for active extravasation correlating with the area of active GI bleeding seen on preceding nuclear medicine tagged with blood cell scan.   Electronically Signed   By: Sandi Mariscal M.D.   On: 05/18/2014 09:40   Ir US Guide Vasc Access Left  05/18/2014   INDICATION: History of recent TAVR procedure, with subsequent initiation of Plavix and aspirin, now admitted with acute lower GI bleed with nuclear medicine tagged red blood cell scan compatible with acute lower GI bleed within the sigmoid colon of the left lower abdomen.  EXAM: 1. ULTRASOUND GUIDANCE FOR ARTERIAL ACCESS 2. SUPERIOR AND INFERIOR MESENTERIC ARTERIOGRAMS 3. SUB SELECTIVE  SIGMOID ARTERIOGRAM (3rd ORDER) 4. SUB SELECTIVE SIGMOID ARCADE ARTERIOGRAM (3rd ORDER) AND PERCUTANEOUS COIL EMBOLIZATION  COMPARISON:  Nuclear medicine tagged bleeding scan - 05/17/2014; CTA of the chest, abdomen pelvis - 02/21/2014  MEDICATIONS: Fentanyl 100 mcg IV; Versed 3 mg IV  CONTRAST:  179m OMNIPAQUE IOHEXOL 300 MG/ML  SOLN  FLUOROSCOPY TIME:  31 minutes 6 seconds.  ACCESS: Left common femoral artery; hemostasis achieved with the deployment of an Exoseal device and manual compression  COMPLICATIONS: None immediate  TECHNIQUE: Informed written consent was obtained from the patient and the patient's white after a discussion of the risks, benefits and alternatives to treatment. Questions regarding the procedure were encouraged and answered. A timeout was performed prior to the initiation of the procedure.  Given the presence of a  healing incision at the patient's right groin secondary to recent TAVR procedure, the left groin was selected for arterial access. As such, the left groin was prepped and draped in the usual sterile fashion, and a sterile drape was applied covering the operative field. Maximum barrier sterile technique with sterile gowns and gloves were used for the procedure. A timeout was performed prior to the initiation of the procedure. Local anesthesia was provided with 1% lidocaine.  The left femoral head was marked fluoroscopically. Under ultrasound guidance, the left common femoral artery was accessed with a micropuncture kit after the overlying soft tissues were anesthetized with 1% lidocaine. An ultrasound image was saved for documentation purposes. The micropuncture sheath was exchanged for a 5 [place] vascular sheath over a Bentson wire. A closure arteriogram was performed through the side of the sheath confirming access within the right common femoral artery.  Over a Bentson wire, a Mickelson catheter was advanced to the level of the thoracic aorta where it was back bled and flushed.  The catheter was then utilized to select the SMA and several superior mesenteric arteriograms were performed in various obliquities.  The Mickelson catheter was then utilized to select the IMA and several inferior mesenteric arteriograms were performed in various obliquities demonstrating an ill-defined area of active extravasation originating from a distal sigmoid arcade vessel compatible with the findings on preceding nuclear medicine tagged will blood cell scan.  With the use of a Synchro 14 soft tip micro wire, a regular Renegade micro catheter was advanced to the level of the sigmoid artery and a selective sigmoid arteriogram was performed. The micro catheter was then utilized to select the providing arcade vessel and a sub selective sigmoid arcade arteriogram was performed.  Prolonged efforts were made to sub select the caudal division of this distal sigmoid arcade supplying the area of active extravasation however this initially proved unsuccessful. As such, the cranial division of this distal sigmoid arcade was percutaneously coil embolized with multiple overlapping pushable 2 mm diameter coils to the origin of the caudal tributary. With the dominant cranial division now embolized, the caudal division providing the area of active extravasation was then successfully cannulated and percutaneously coil embolized with multiple overlapping 2 mm diameter interlock coils to the level of the origin of the sigmoid arcade vessel. The micro catheter was retracted into the sigmoid artery and a completion sigmoid arteriogram was performed.  At this point, the procedure was terminated. All wires catheters and sheaths were removed from the patient. Hemostasis was achieved at the left groin access site with deployment of an Exoseal device and manual compression. A dressing was placed. The patient tolerated the procedure well without immediate postprocedural complication.  FINDINGS: Multiple superior mesenteric arteriograms  were performed centering over the area of active extravasation seen on preceding nuclear medicine tagged red blood cell study, though was negative for discrete area of extravasation or vessel irregularity.  As such, multiple inferior mesenteric arteriograms were performed ultimately demonstrating an ill-defined area of active extravasation within the left lower abdomen correlating with the area of suspected bleeding seen on preceding tagged nuclear medicine red blood cell scan.  Selective sigmoid arteriogram confirmed an area of ill-defined active extravasation within the sigmoid colon of the left lower abdomen supplied by a distal sigmoid arcade vessel. This distal arcade vessel was noted to have proximal bifurcation with the caudal division supplying the area of active extravasation. Prolonged efforts were made to sub selective this caudal division however this proved difficult secondary to  tortuosity of the division's origin. As such, the dominant cranial division was selected and percutaneously coil embolized with multiple overlapping 2 mm diameter coils. Ultimately, the supplying caudal division was then able to be selected and was successfully percutaneous coil embolized with two overlapping 2 mm diameter interlock coils to the level of the supplying arcade vessel's origin.  Completion sigmoid arteriogram was negative for any residual area of active extravasation.  IMPRESSION: Technically successful percutaneous coil embolization of a distal sigmoid arcade vessel for active extravasation correlating with the area of active GI bleeding seen on preceding nuclear medicine tagged with blood cell scan.   Electronically Signed   By: Sandi Mariscal M.D.   On: 05/18/2014 09:40   Dg Chest Port 1 View  05/19/2014   CLINICAL DATA:  Assess pulmonary edema.  EXAM: PORTABLE CHEST - 1 VIEW  COMPARISON:  04/23/2014.  FINDINGS: Unchanged orientation of dual-chamber pacer leads from the left.  Stable cardiomegaly. Previous  median sternotomy. Status post TAVR. There is patchy ground-glass density in the lower lungs and in the left upper lobe. There is no edema noted within the right upper long. No effusion or pneumothorax.  IMPRESSION: Bilateral lung opacities, asymmetry favoring atelectasis or pneumonia.   Electronically Signed   By: Jorje Guild M.D.   On: 05/19/2014 06:24   Bliss Guide Roadmapping  05/18/2014   INDICATION: History of recent TAVR procedure, with subsequent initiation of Plavix and aspirin, now admitted with acute lower GI bleed with nuclear medicine tagged red blood cell scan compatible with acute lower GI bleed within the sigmoid colon of the left lower abdomen.  EXAM: 1. ULTRASOUND GUIDANCE FOR ARTERIAL ACCESS 2. SUPERIOR AND INFERIOR MESENTERIC ARTERIOGRAMS 3. SUB SELECTIVE SIGMOID ARTERIOGRAM (3rd ORDER) 4. SUB SELECTIVE SIGMOID ARCADE ARTERIOGRAM (3rd ORDER) AND PERCUTANEOUS COIL EMBOLIZATION  COMPARISON:  Nuclear medicine tagged bleeding scan - 05/17/2014; CTA of the chest, abdomen pelvis - 02/21/2014  MEDICATIONS: Fentanyl 100 mcg IV; Versed 3 mg IV  CONTRAST:  157m OMNIPAQUE IOHEXOL 300 MG/ML  SOLN  FLUOROSCOPY TIME:  31 minutes 6 seconds.  ACCESS: Left common femoral artery; hemostasis achieved with the deployment of an Exoseal device and manual compression  COMPLICATIONS: None immediate  TECHNIQUE: Informed written consent was obtained from the patient and the patient's white after a discussion of the risks, benefits and alternatives to treatment. Questions regarding the procedure were encouraged and answered. A timeout was performed prior to the initiation of the procedure.  Given the presence of a healing incision at the patient's right groin secondary to recent TAVR procedure, the left groin was selected for arterial access. As such, the left groin was prepped and draped in the usual sterile fashion, and a sterile drape was applied covering the operative field.  Maximum barrier sterile technique with sterile gowns and gloves were used for the procedure. A timeout was performed prior to the initiation of the procedure. Local anesthesia was provided with 1% lidocaine.  The left femoral head was marked fluoroscopically. Under ultrasound guidance, the left common femoral artery was accessed with a micropuncture kit after the overlying soft tissues were anesthetized with 1% lidocaine. An ultrasound image was saved for documentation purposes. The micropuncture sheath was exchanged for a 5 [place] sheath over a Bentson wire. A closure arteriogram was performed through the side of the sheath confirming access within the right common femoral artery.  Over a Bentson wire, a Mickelson catheter was advanced to the  level of the thoracic aorta where it was back bled and flushed. The catheter was then utilized to select the SMA and several superior mesenteric arteriograms were performed in various obliquities.  The Mickelson catheter was then utilized to select the IMA and several inferior mesenteric arteriograms were performed in various obliquities demonstrating an ill-defined area of active extravasation originating from a distal sigmoid arcade vessel compatible with the findings on preceding nuclear medicine tagged will blood cell scan.  With the use of a Synchro 14 soft tip micro wire, a regular Renegade micro catheter was advanced to the level of the sigmoid artery and a selective sigmoid arteriogram was performed. The micro catheter was then utilized to select the providing arcade vessel and a sub selective sigmoid arcade arteriogram was performed.  Prolonged efforts were made to sub select the caudal division of this distal sigmoid arcade supplying the area of active extravasation however this initially proved unsuccessful. As such, the cranial division of this distal sigmoid arcade was percutaneously coil embolized with multiple overlapping pushable 2 mm diameter coils to  the origin of the caudal tributary. With the dominant cranial division now embolized, the caudal division providing the area of active extravasation was then successfully cannulated and percutaneously coil embolized with multiple overlapping 2 mm diameter interlock coils to the level of the origin of the sigmoid arcade vessel. The micro catheter was retracted into the sigmoid artery and a completion sigmoid arteriogram was performed.  At this point, the procedure was terminated. All wires catheters and sheaths were removed from the patient. Hemostasis was achieved at the left groin access site with deployment of an Exoseal device and manual compression. A dressing was placed. The patient tolerated the procedure well without immediate postprocedural complication.  FINDINGS: Multiple superior mesenteric arteriograms were performed centering over the area of active extravasation seen on preceding nuclear medicine tagged red blood cell study, though was negative for discrete area of extravasation or vessel irregularity.  As such, multiple inferior mesenteric arteriograms were performed ultimately demonstrating an ill-defined area of active extravasation within the left lower abdomen correlating with the area of suspected bleeding seen on preceding tagged nuclear medicine red blood cell scan.  Selective sigmoid arteriogram confirmed an area of ill-defined active extravasation within the sigmoid colon of the left lower abdomen supplied by a distal sigmoid arcade vessel. This distal arcade vessel was noted to have proximal bifurcation with the caudal division supplying the area of active extravasation. Prolonged efforts were made to sub selective this caudal division however this proved difficult secondary to tortuosity of the division's origin. As such, the dominant cranial division was selected and percutaneously coil embolized with multiple overlapping 2 mm diameter coils. Ultimately, the supplying caudal division was  then able to be selected and was successfully percutaneous coil embolized with two overlapping 2 mm diameter interlock coils to the level of the supplying arcade vessel's origin.  Completion sigmoid arteriogram was negative for any residual area of active extravasation.  IMPRESSION: Technically successful percutaneous coil embolization of a distal sigmoid arcade vessel for active extravasation correlating with the area of active GI bleeding seen on preceding nuclear medicine tagged with blood cell scan.   Electronically Signed   By: Sandi Mariscal M.D.   On: 05/18/2014 09:40    Microbiology: No results found for this or any previous visit (from the past 240 hour(s)).   Labs: Basic Metabolic Panel:  Recent Labs Lab 06/02/14 1024  NA 144  K 4.5  CL 111  CO2  30  GLUCOSE 94  BUN 20  CREATININE 1.05  CALCIUM 8.5   Liver Function Tests: No results found for this basename: AST, ALT, ALKPHOS, BILITOT, PROT, ALBUMIN,  in the last 168 hours No results found for this basename: LIPASE, AMYLASE,  in the last 168 hours No results found for this basename: AMMONIA,  in the last 168 hours CBC: No results found for this basename: WBC, NEUTROABS, HGB, HCT, MCV, PLT,  in the last 168 hours Cardiac Enzymes: No results found for this basename: CKTOTAL, CKMB, CKMBINDEX, TROPONINI,  in the last 168 hours BNP: BNP (last 3 results)  Recent Labs  12/05/13 1654 04/05/14 1734  PROBNP 382.0* 3725.0*   CBG: No results found for this basename: GLUCAP,  in the last 168 hours     Signed:  Terrell Ostrand  [facility] 06/05/2014, 5:10 PM

## 2014-06-06 LAB — BASIC METABOLIC PANEL WITH GFR
BUN: 21 mg/dL (ref 6–23)
CHLORIDE: 108 meq/L (ref 96–112)
CO2: 29 meq/L (ref 19–32)
Calcium: 8.3 mg/dL — ABNORMAL LOW (ref 8.4–10.5)
Creat: 1.04 mg/dL (ref 0.50–1.35)
GFR, Est African American: 76 mL/min
GFR, Est Non African American: 66 mL/min
Glucose, Bld: 90 mg/dL (ref 70–99)
POTASSIUM: 4.4 meq/L (ref 3.5–5.3)
Sodium: 145 mEq/L (ref 135–145)

## 2014-06-09 ENCOUNTER — Ambulatory Visit: Payer: Medicare Other | Admitting: Cardiovascular Disease

## 2014-06-10 ENCOUNTER — Encounter: Payer: Self-pay | Admitting: Cardiovascular Disease

## 2014-06-12 ENCOUNTER — Encounter: Payer: Self-pay | Admitting: Cardiovascular Disease

## 2014-06-12 ENCOUNTER — Ambulatory Visit (INDEPENDENT_AMBULATORY_CARE_PROVIDER_SITE_OTHER): Payer: Medicare Other | Admitting: Cardiovascular Disease

## 2014-06-12 VITALS — BP 100/84 | HR 70 | Resp 16 | Ht 70.0 in | Wt 171.5 lb

## 2014-06-12 DIAGNOSIS — I10 Essential (primary) hypertension: Secondary | ICD-10-CM

## 2014-06-12 NOTE — Patient Instructions (Signed)
STOP Furosemide and Potassium.  Dr. [name] recommends that you schedule a follow-up appointment in: 3 Months.  Contact our office if your weight goes above 170 lbs.

## 2014-06-12 NOTE — Progress Notes (Signed)
Patient ID: [name] [name], male   DOB: 1929/07/30, 80 y.o.   MRN: [id number]      Reason for office visit Acute on chronic heart failure  [name] [name] Is feeling much better. According to both his home scale and our office scale he has lost more than 18 pounds of weight (our office scale consistently records about 7 pounds more than his home scale). He stopped the furosemide a couple of days ago his weight has remained state at around 164.5 pounds at home. He is no longer dyspneic. His edema has resolved almost completely. He is now able to walk without stopping to catch his breath. This is an improvement not only over last week but also substantial improvement compared to 6 months ago before he underwent his LAD stented aortic valve stent graft.  His cardiac problems date back to 2009 when he was diagnosed with severe mitral insufficiency related to mitral valve prolapse. He was also having problems with paroxysmal atrial fibrillation. Coronary angiography demonstrated moderate coronary artery disease primarily a 70% calcified eccentric proximal LAD lesion with moderate stenoses in the other 2 major coronary arteries. He was referred for robotic mitral valve repair by Dr. Amador [name] at [facility]. The LIMA was found to be a very small vessel. He received a saphenous vein graft bypass to the LAD. Cryoablation was performed for atrial fibrillation. The mitral valve was replaced with a 29 mm St. Jude Biocor prosthesis. The postoperative course was complicated by an embolic stroke from which she has recovered without sequelae. He received a dual-chamber permanent pacemaker (Medtronic adapta) and is pacemaker dependent with 100% pacing in both the atrium and the ventricle. He has remained on amiodarone therapy with an extremely low burden of atrial fibrillation. As far as I can tell from the record the last documentation of atrial fibrillation was a one-hour episode that occurred in May of 2012. He also remains on warfarin  therapy.  Subsequently, he has developed progressive CAD and aortic valve disease and underwent rotational atherectomy and stenting of the proximal LAD on 03/24/2014 (3 x 8 mm Xience Alpine drug-eluting stent) and then TAVR (29 mm Edwards Sapien) on April 22, 2014.  Left ventricular systolic function is mildly depressed with an EF of 45-50% primarily due to RV pacing induced asynchrony.  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 This prevents imaging of his heart from transesophageal views  He has mild hyperthyroidism that is well controlled on low dose of methimazole. 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 He has gastroesophageal reflux disease and Barrett's esophagus. He has extensive sigmoid and transverse colonic diverticulosis.   No Known Allergies  Current Outpatient Prescriptions  Medication Sig Dispense Refill  . amiodarone (PACERONE) 200 MG tablet Take 1 tablet (200 mg total) by mouth daily.  90 tablet  3  . clopidogrel (PLAVIX) 75 MG tablet Take 75 mg by mouth daily with breakfast.      . ferrous sulfate 325 (65 FE) MG tablet Take 325 mg by mouth daily with breakfast.      . methimazole (TAPAZOLE) 5 MG tablet Take 5 mg by mouth every Monday, Wednesday, and Friday.       . metoprolol succinate (TOPROL-XL) 50 MG 24 hr tablet Take 1 tablet (50 mg total) by mouth daily. Take with or immediately following a meal.  30 tablet  5  . Multiple Vitamin (MULTIVITAMIN WITH MINERALS) TABS Take 1 tablet by mouth daily. Centrum Silver      .  pantoprazole (PROTONIX) 40 MG tablet Take 40 mg by mouth 2 (two) times daily.      . simvastatin (ZOCOR) 40 MG tablet Take 1 tablet (40 mg total) by mouth at bedtime.  90 tablet  0  . Tamsulosin HCl (FLOMAX) 0.4 MG CAPS Take 0.4 mg by mouth every evening.        No current facility-administered medications for this visit.    Past Medical History  Diagnosis Date  . GASTRIC POLYP 05/13/2009  . COLONIC POLYPS,  ADENOMATOUS 01/14/2008  . HYPERLIPIDEMIA 06/01/2007  . ANEMIA, IRON DEFICIENCY 10/13/2008  . COAGULOPATHY, COUMADIN-INDUCED 01/14/2008  . ANXIETY 09/04/2008  . HYPERTENSION 06/01/2007  . ATRIAL FIBRILLATION, CHRONIC 01/14/2008    on warfarin since 2007  . UNSPECIFIED VENOUS INSUFFICIENCY 09/18/2008  . PLEURAL EFFUSION, LEFT 10/13/2008  . GERD 06/01/2007  . BARRETTS ESOPHAGUS 03/20/2009  . PROSTATE CANCER, HX OF 01/14/2008  . CATARACTS, BILATERAL, HX OF 01/14/2008  . CEREBROVASCULAR ACCIDENT, HX OF 08/18/2008  . PERSONAL HX COLONIC POLYPS 01/01/2010  . BASAL CELL CARCINOMA OF SKIN SITE UNSPECIFIED 09/28/2010  . Hyperglycemia   . Elevated PSA   . Restless leg syndrome   . Mitral regurgitation 06/13/2008    mitral valve replacement #20 St. Jude Biocor; Maze procedure by Dr. Amador [name]  at [facility]  . CORONARY ARTERY DISEASE 06/01/2007    Cath 04/03/2008; Cath 11/01/1990  . Tachy-brady syndrome 07/13/2008    medtronic Adapta gen. model ADDR01 ,serial # [id number] H by Dr  [name] [name] at [facility]  . Peripheral artery disease 06/16/2010    LEA doppler-left ABI nml 0.61 calcified vessels;right ABI  0.68  . Edema, peripheral 07/30/2009    LEV doppler- no thrombus or thrombophlebitis  . Aortic valvular disorder 08/16/2012    echo EF 45-50% LV mildly reduce,Biocor mitral valve,mild to mod. tricuspid regrug.,mild to mod. aortic regrug.  . Mitral valve disorder 09/08/2011    echo EF 50-55% LV normal  . Aortic valvular stenosis 11/19/2013  . Cardiomyopathy, ischemic 11/19/2013    EF 45-50% with inferior wall hypokinesis by echo 2014  . S/P CABG x 1 06/13/2008    SVG to LAD with open vein harvest right thigh, LIMA harvested but not utilized by Dr [name] [name]  . S/P mitral valve replacement with bioprosthetic valve 06/13/2008    75mm St Jude Biocor porcine bioprosthetic tissue valve by Dr [name] [name]  . S/P Maze operation for atrial fibrillation 06/13/2008    Partial left atrial lesion set using cryothermy for bilateral  pulmonary vein isolation by Dr [name] [name]  . Prosthetic valve dysfunction     Mitral stenosis involving bioprosthetic tissue valve placed 06/13/2008  . Mitral stenosis     Porcine bioprosthetic tissue valve placed 06/13/2008  . Dysrhythmia     HX OF TACHY BRADY  . Pacemaker   . HYPERTHYROIDISM 01/06/2011  . 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 Prostatectomy    . Tonsillectomy    . Cystectomy      Left leg  . Pacemaker placement  07/13/2008    Adapata dual chamber-Medtronic at [facility]   . Coronary artery bypass graft  06/13/2008    graft x1 w/vein graft LAD artery by Dr. Amador [name] at [facility]  . Mitral valve replacement  06/13/2008    #20 St. Jude Bicor mitral valve ;Maze procedure at [facility] by Dr.  Chitwood  . Cardiac catheterization  11/01/1990  . Skin cancer excision      removed from forehead and right leg, nose/face  . Upper gastrointestinal endoscopy  07/14/2009    erosive reflux esopagitis, Barrett's esophagus  . Colonoscopy  07/14/2009    diverticulosis, internal hemorrhoids  . Cardiovascular stress test  06/03/2010    Persantine perfusion EF 45% mild to moderate ischemia basal and mid inferolateral region  . Cardiovascular stress test  01/01/2008    left ventricle normal,no significant ischemia  . Cardiovascular stress test  09/09/2005     possibility of ischemia inferior wall  . Tee without cardioversion N/A 01/22/2014    Procedure: TRANSESOPHAGEAL ECHOCARDIOGRAM (TEE);  Surgeon: Sanda Klein, MD;  Location: [facility] ENDOSCOPY;  Service: Cardiovascular;  Laterality: N/A;  . Coronary stent placement  03/24/2014    DES to LAD  . Transcatheter aortic valve replacement, transfemoral N/A 04/22/2014    Procedure: TRANSCATHETER AORTIC VALVE REPLACEMENT, TRANSFEMORAL;  Surgeon: [name], MD;  Location: [place];  Service: Open  Heart Surgery;  Laterality: N/A;  TAVR-TF (RIGHT SIDE)  . Intraoperative transesophageal echocardiogram N/A 04/22/2014    Procedure: INTRAOPERATIVE TRANSTHORACIC ECHOCARDIOGRAM;  Surgeon: [name], MD;  Location: [place];  Service: Open Heart Surgery;  Laterality: N/A;    Family History  Problem Relation Age of Onset  . Cancer Mother     Breast Cancer    History   Social History  . Marital Status: Married    Spouse Name: N/A    Number of Children: 4  . Years of Education: N/A   Occupational History  . Retired    Social History Main Topics  . Smoking status: Former Smoker -- 2 years    Types: Pipe    Quit date: 11/07/1965  . Smokeless tobacco: Never Used     Comment: quit over 40 years ago, never smoked cigarettes  . Alcohol Use: No  . Drug Use: No  . Sexual Activity: Not on file   Other Topics Concern  . Not on file   Social History Narrative  . No narrative on file    Review of systems: The patient specifically denies any chest pain at rest or with exertion, dyspnea at rest or with exertion, orthopnea, paroxysmal nocturnal dyspnea, syncope, palpitations, focal neurological deficits, intermittent claudication, lower extremity edema, unexplained weight gain, cough, hemoptysis or wheezing.  The patient also denies abdominal pain, nausea, vomiting, dysphagia, diarrhea, constipation, polyuria, polydipsia, dysuria, hematuria, frequency, urgency, abnormal bleeding or bruising, fever, chills, unexpected weight changes, mood swings, change in skin or hair texture, change in voice quality, auditory or visual problems, allergic reactions or rashes, new musculoskeletal complaints other than usual "aches and pains".   PHYSICAL EXAM BP 100/84  Pulse 70  Resp 16  Ht 5\' 10"  (1.778 m)  Wt 171 lb 8 oz (77.792 kg)  BMI 24.61 kg/m2 General: Alert, oriented x3, no distress  Head: no evidence of trauma, PERRL, EOMI, no exophtalmos or lid lag, no myxedema, no xanthelasma; normal  ears, nose and oropharynx  Neck: only about 2-3 cm elevation in jugular venous pulsations, still has mild hepatojugular reflux; brisk carotid pulses without delay and no carotid bruits  Chest: clear to auscultation, no signs of consolidation by percussion or palpation, normal fremitus, symmetrical and full respiratory excursions, sternotomy scar  Cardiovascular: normal position and quality of the apical impulse, regular rhythm, normal first and second heart sounds, no systolic murmurs, grade 3-4/1 decrescendo diastolic murmur at the right upper and right  lower sternal border , no rubs or gallops  Abdomen: no tenderness or distention, no masses by palpation, no abnormal pulsatility or arterial bruits, normal bowel sounds, large liver roughly 3-4 cm beneath the right costal margin  Extremities: no clubbing, cyanosis; 1+ ankle swelling symmetrically; left upper extreme swelling has resolved; 2+ radial, ulnar and brachial pulses bilaterally; 2+ right femoral, posterior tibial and dorsalis pedis pulses; 2+ left femoral, posterior tibial and dorsalis pedis pulses; no subclavian or femoral bruits  Neurological: grossly nonfocal   EKG:  atrial ventricular sequential pacing  Lipid Panel     Component Value Date/Time   CHOL 112 12/23/2013 0901   TRIG 62.0 12/23/2013 0901   HDL 41.50 12/23/2013 0901   CHOLHDL 3 12/23/2013 0901   VLDL 12.4 12/23/2013 0901   LDLCALC 58 12/23/2013 0901    BMET    Component Value Date/Time   NA 145 06/06/2014 1104   K 4.4 06/06/2014 1104   CL 108 06/06/2014 1104   CO2 29 06/06/2014 1104   GLUCOSE 90 06/06/2014 1104   BUN 21 06/06/2014 1104   CREATININE 1.04 06/06/2014 1104   CREATININE 0.91 05/21/2014 0544   CALCIUM 8.3* 06/06/2014 1104   GFRNONAA 66 06/06/2014 1104   GFRNONAA 76* 05/21/2014 0544   GFRAA 76 06/06/2014 1104   GFRAA 88* 05/21/2014 0544     ASSESSMENT AND PLAN  Substantial improvement in findings of right and left heart failure. He may still be slightly  hypovolemic as evidenced by hepatojugular reflux and mild residual ankle swelling. He is however completely asymptomatic. Discontinue furosemide and potassium supplements. Continue daily weights at home with as needed furosemide if he should exceed 170 pounds. Continue antiplatelet agents for proximal LAD artery stent. Followup in 3 months  Orders Placed This Encounter  Procedures  . EKG 12-Lead   No orders of the defined types were placed in this encounter.    Holli Humbles, MD, [place] [phone] office [phone] pager

## 2014-06-21 ENCOUNTER — Encounter (HOSPITAL_COMMUNITY): Payer: Self-pay | Admitting: Emergency Medicine

## 2014-06-21 ENCOUNTER — Emergency Department (INDEPENDENT_AMBULATORY_CARE_PROVIDER_SITE_OTHER)
Admission: EM | Admit: 2014-06-21 | Discharge: 2014-06-21 | Disposition: A | Discharge status: Home / Self Care | Payer: Medicare Other | Source: Home / Self Care | Attending: Family Medicine | Admitting: Family Medicine

## 2014-06-21 DIAGNOSIS — S51809A Unspecified open wound of unspecified forearm, initial encounter: Secondary | ICD-10-CM

## 2014-06-21 DIAGNOSIS — IMO0002 Reserved for concepts with insufficient information to code with codable children: Secondary | ICD-10-CM

## 2014-06-21 DIAGNOSIS — S51812A Laceration without foreign body of left forearm, initial encounter: Secondary | ICD-10-CM

## 2014-06-21 MED ORDER — GELATIN ABSORBABLE 12-7 MM EX MISC
1.0000 | Freq: Once | CUTANEOUS | Status: AC
  Administered 2014-06-21: 1 via TOPICAL

## 2014-06-21 NOTE — ED Notes (Signed)
States he was asleep, when he struck his arm on table  3 days ago, and sustained skin tear injury to left forearm. Had open area aprox 2x4 open, oozing blood. Denies other injury

## 2014-06-21 NOTE — ED Provider Notes (Signed)
CSN: [id number]     Arrival date & time 06/21/14  9371 History   First MD Initiated Contact with Patient 06/21/14 1106     Chief Complaint  Patient presents with  . Arm Injury   (Consider location/radiation/quality/duration/timing/severity/associated sxs/prior Treatment) HPI Comments: Patient accidentally struck his left forearm on the bedside table two days ago and as a result, he suffered a skin tear to his left forearm. He has had difficulty with persistent bleeding since injury. Patient does take Plavix daily.  Tdap 11/24/2013  Patient is a 80 y.o. male presenting with arm injury. The history is provided by the patient.  Arm Injury   Past Medical History  Diagnosis Date  . GASTRIC POLYP 05/13/2009  . COLONIC POLYPS, ADENOMATOUS 01/14/2008  . HYPERLIPIDEMIA 06/01/2007  . ANEMIA, IRON DEFICIENCY 10/13/2008  . COAGULOPATHY, COUMADIN-INDUCED 01/14/2008  . ANXIETY 09/04/2008  . HYPERTENSION 06/01/2007  . ATRIAL FIBRILLATION, CHRONIC 01/14/2008    on warfarin since 2007  . UNSPECIFIED VENOUS INSUFFICIENCY 09/18/2008  . PLEURAL EFFUSION, LEFT 10/13/2008  . GERD 06/01/2007  . BARRETTS ESOPHAGUS 03/20/2009  . PROSTATE CANCER, HX OF 01/14/2008  . CATARACTS, BILATERAL, HX OF 01/14/2008  . CEREBROVASCULAR ACCIDENT, HX OF 08/18/2008  . PERSONAL HX COLONIC POLYPS 01/01/2010  . BASAL CELL CARCINOMA OF SKIN SITE UNSPECIFIED 09/28/2010  . Hyperglycemia   . Elevated PSA   . Restless leg syndrome   . Mitral regurgitation 06/13/2008    mitral valve replacement #20 St. Jude Biocor; Maze procedure by Dr. Amador Cunas  at [facility]  . CORONARY ARTERY DISEASE 06/01/2007    Cath 04/03/2008; Cath 11/01/1990  . Tachy-brady syndrome 07/13/2008    medtronic Adapta gen. model ADDR01 ,serial # [id number] H by Dr  [name] at [facility]  . Peripheral artery disease 06/16/2010    LEA doppler-left ABI nml 0.61 calcified vessels;right ABI  0.68  . Edema, peripheral 07/30/2009    LEV doppler- no thrombus or thrombophlebitis  .  Aortic valvular disorder 08/16/2012    echo EF 45-50% LV mildly reduce,Biocor mitral valve,mild to mod. tricuspid regrug.,mild to mod. aortic regrug.  . Mitral valve disorder 09/08/2011    echo EF 50-55% LV normal  . Aortic valvular stenosis 11/19/2013  . Cardiomyopathy, ischemic 11/19/2013    EF 45-50% with inferior wall hypokinesis by echo 2014  . S/P CABG x 1 06/13/2008    SVG to LAD with open vein harvest right thigh, LIMA harvested but not utilized by Dr Amador Cunas  . S/P mitral valve replacement with bioprosthetic valve 06/13/2008    30mm St Jude Biocor porcine bioprosthetic tissue valve by Dr Amador Cunas  . S/P Maze operation for atrial fibrillation 06/13/2008    Partial left atrial lesion set using cryothermy for bilateral pulmonary vein isolation by Dr Amador Cunas  . Prosthetic valve dysfunction     Mitral stenosis involving bioprosthetic tissue valve placed 06/13/2008  . Mitral stenosis     Porcine bioprosthetic tissue valve placed 06/13/2008  . Dysrhythmia     HX OF TACHY BRADY  . Pacemaker   . HYPERTHYROIDISM 01/06/2011  . 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 Prostatectomy    . Tonsillectomy    . Cystectomy      Left leg  . Pacemaker placement  07/13/2008    Adapata dual chamber-Medtronic at [facility]   . Coronary artery bypass graft  06/13/2008    graft x1 w/vein graft LAD artery by Dr. Amador Cunas at [facility]  . Mitral valve replacement  06/13/2008    #20 St. Jude Bicor mitral valve ;Maze procedure at [facility] by Dr. Amador Cunas  . Cardiac catheterization  11/01/1990  . Skin cancer excision      removed from forehead and right leg, nose/face  . Upper gastrointestinal endoscopy  07/14/2009    erosive reflux esopagitis, Barrett's esophagus  . Colonoscopy  07/14/2009    diverticulosis, internal hemorrhoids  . Cardiovascular stress test   06/03/2010    Persantine perfusion EF 45% mild to moderate ischemia basal and mid inferolateral region  . Cardiovascular stress test  01/01/2008    left ventricle normal,no significant ischemia  . Cardiovascular stress test  09/09/2005     possibility of ischemia inferior wall  . Tee without cardioversion N/A 01/22/2014    Procedure: TRANSESOPHAGEAL ECHOCARDIOGRAM (TEE);  Surgeon: Sanda Klein, MD;  Location: [facility] ENDOSCOPY;  Service: Cardiovascular;  Laterality: N/A;  . Coronary stent placement  03/24/2014    DES to LAD  . Transcatheter aortic valve replacement, transfemoral N/A 04/22/2014    Procedure: TRANSCATHETER AORTIC VALVE REPLACEMENT, TRANSFEMORAL;  Surgeon: [name], MD;  Location: [place];  Service: Open Heart Surgery;  Laterality: N/A;  TAVR-TF (RIGHT SIDE)  . Intraoperative transesophageal echocardiogram N/A 04/22/2014    Procedure: INTRAOPERATIVE TRANSTHORACIC ECHOCARDIOGRAM;  Surgeon: [name], MD;  Location: [place];  Service: Open Heart Surgery;  Laterality: N/A;   Family History  Problem Relation Age of Onset  . Cancer Mother     Breast Cancer   History  Substance Use Topics  . Smoking status: Former Smoker -- 2 years    Types: Pipe    Quit date: 11/07/1965  . Smokeless tobacco: Never Used     Comment: quit over 40 years ago, never smoked cigarettes  . Alcohol Use: No    Review of Systems  All other systems reviewed and are negative.   Allergies  Review of patient's allergies indicates no known allergies.  Home Medications   Prior to Admission medications   Medication Sig Start Date End Date Taking? Authorizing Provider  amiodarone (PACERONE) 200 MG tablet Take 1 tablet (200 mg total) by mouth daily. 05/12/14   [name] [name], MD  clopidogrel (PLAVIX) 75 MG tablet Take 75 mg by mouth daily with breakfast.    Historical Provider, MD  ferrous sulfate 325 (65 FE) MG tablet Take 325 mg by mouth daily with breakfast.    Historical Provider, MD  methimazole  (TAPAZOLE) 5 MG tablet Take 5 mg by mouth every Monday, Wednesday, and Friday.  02/25/14   [name], MD  metoprolol succinate (TOPROL-XL) 50 MG 24 hr tablet Take 1 tablet (50 mg total) by mouth daily. Take with or immediately following a meal. 04/24/14   Brittainy Simmons, PA-C  Multiple Vitamin (MULTIVITAMIN WITH MINERALS) TABS Take 1 tablet by mouth daily. Centrum Silver    Historical Provider, MD  pantoprazole (PROTONIX) 40 MG tablet Take 40 mg by mouth 2 (two) times daily.    Historical Provider, MD  simvastatin (ZOCOR) 40 MG tablet Take 1 tablet (40 mg total) by mouth at bedtime. 05/12/14   [name], MD  Tamsulosin HCl (FLOMAX) 0.4 MG CAPS Take 0.4 mg by mouth every evening.     Historical Provider, MD   BP 124/68  Pulse 72  Temp(Src) 97.8 F (36.6 C) (Oral)  SpO2 98% Physical Exam  Nursing note and vitals reviewed. Constitutional:  He is oriented to person, place, and time. He appears well-developed and well-nourished. No distress.  HENT:  Head: Normocephalic and atraumatic.  Cardiovascular: Normal rate.   Pulmonary/Chest: Effort normal.  Musculoskeletal: Normal range of motion.  Neurological: He is alert and oriented to person, place, and time.  Skin: Skin is warm and dry.  2 cm x 3.5 cm rectangular skin tear at left mid forearm with persistent oozing of red blood.   Psychiatric: He has a normal mood and affect. His behavior is normal.    ED Course  Procedures (including critical care time) Labs Review Labs Reviewed - No data to display  Imaging Review No results found.   MDM   1. Skin tear of forearm without complication, left, initial encounter    Sent staff member to [facility] for gelfoam dressing. Wound cleaned with saline and gelfoam applied over skin tear followed by Telfa gauze, ABD pad and coban wrap. Patient provided written and verbal instructions regarding wound care at home. Instructed to return if bleeding persists.    Lutricia Feil, [place] 06/21/14 1225

## 2014-06-21 NOTE — ED Provider Notes (Signed)
Medical screening examination/treatment/procedure(s) were performed by resident physician or non-physician practitioner and as supervising physician I was immediately available for consultation/collaboration.   Pauline Good MD.   Billy Fischer, MD 06/21/14 1247

## 2014-06-21 NOTE — Discharge Instructions (Signed)
Please keep today's dressing clean, dry and in place for the next 2 days. You may then remove and change the outer dressing, leaving the small gelfoam squares in place. Monitor closely for infection and if signs of infection develop, return here for re-evaluation. Clean wound and replace dressing daily until healed.  Skin Tear Care A skin tear is a wound in which the top layer of skin has peeled off. This is a common problem with aging because the skin becomes thinner and more fragile as a person gets older. In addition, some medicines, such as oral corticosteroids, can lead to skin thinning if taken for long periods of time.  A skin tear is often repaired with tape or skin adhesive strips. This keeps the skin that has been peeled off in contact with the healthier skin beneath. Depending on the location of the wound, a bandage (dressing) may be applied over the tape or skin adhesive strips. Sometimes, during the healing process, the skin turns black and dies. Even when this happens, the torn skin acts as a good dressing until the skin underneath gets healthier and repairs itself. HOME CARE INSTRUCTIONS   Change dressings once per day or as directed by your caregiver.  Gently clean the skin tear and the area around the tear using saline solution or mild soap and water.  Do not rub the injured skin dry. Let the area air dry.  Apply petroleum jelly or an antibiotic cream or ointment to keep the tear moist. This will help the wound heal. Do not allow a scab to form.  If the dressing sticks before the next dressing change, moisten it with warm soapy water and gently remove it.  Protect the injured skin until it has healed.  Only take over-the-counter or prescription medicines as directed by your caregiver.  Take showers or baths using warm soapy water. Apply a new dressing after the shower or bath.  Keep all follow-up appointments as directed by your caregiver.  SEEK IMMEDIATE MEDICAL CARE IF:     You have redness, swelling, or increasing pain in the skin tear.  You havepus coming from the skin tear.  You have chills.  You have a red streak that goes away from the skin tear.  You have a bad smell coming from the tear or dressing.  You have a fever or persistent symptoms for more than 2-3 days.  You have a fever and your symptoms suddenly get worse. MAKE SURE YOU:  Understand these instructions.  Will watch this condition.  Will get help right away if your child is not doing well or gets worse. Document Released: 07/19/2001 Document Revised: 07/18/2012 Document Reviewed: 05/07/2012 [facility] Patient Information 2015 [place], [place]. This information is not intended to replace advice given to you by your health care provider. Make sure you discuss any questions you have with your health care provider.

## 2014-06-24 ENCOUNTER — Telehealth: Payer: Self-pay | Admitting: Cardiovascular Disease

## 2014-06-24 NOTE — Telephone Encounter (Signed)
Returned a call to patient. He states that his wife had called with concerns that he had a blood spot about the size of a pin on his pants today.  He says that you can hardly even see it. I informed patient that he is going to have to be extra careful not to hit or rub up against things due to being on the plavix as well as maybe having some thin skin issues. Patient agrees and states that he told this to his wife. Advised him if he notices any increased and/or excessive bleeding he is to call back to notify. Patient voiced understanding of these instructions.

## 2014-06-24 NOTE — Telephone Encounter (Signed)
Patient hit his arm 4 days ago and had a bleed and had to go to ER.  The next day he had a nosebleed and today he is having a shin bleed.  His wife wanted Dr. Loletha Grayer to know to see if there is something that needs to be done.

## 2014-07-21 ENCOUNTER — Telehealth: Payer: Self-pay | Admitting: Endocrinology

## 2014-07-21 ENCOUNTER — Telehealth: Payer: Self-pay | Admitting: Cardiovascular Disease

## 2014-07-21 MED ORDER — SIMVASTATIN 40 MG PO TABS: 40.0000 mg | ORAL_TABLET | Freq: Every day | ORAL | Status: DC

## 2014-07-21 NOTE — Telephone Encounter (Signed)
Pt is suffering with severe hoarseness. Dr C is aware of this and told him if this got worse,he would recommend a throat doctor. Please call him with the name of this doctor.

## 2014-07-21 NOTE — Telephone Encounter (Signed)
Rx sent to pharamacy.  

## 2014-07-21 NOTE — Telephone Encounter (Signed)
Spoke with pt, aware of dr croitoru's recommendation

## 2014-07-21 NOTE — Telephone Encounter (Signed)
Dr. Radene Journey

## 2014-07-21 NOTE — Telephone Encounter (Signed)
Will forward to dr croitoru for referral

## 2014-07-21 NOTE — Telephone Encounter (Signed)
Patient would like his simvastating sent to his pharmacy 90 day supply  Pharmacy: [facility]   Thank you

## 2014-07-22 ENCOUNTER — Telehealth: Payer: Self-pay | Admitting: Endocrinology

## 2014-07-22 NOTE — Telephone Encounter (Signed)
Lvom advising pt to call back and set up appointment with Dr. Loanne Drilling.

## 2014-07-22 NOTE — Telephone Encounter (Signed)
Patient stated that his arm has been swelling since heart surgery, he had a fall a week ago and his arm is turing black and blue and is getting worst and in a lot of pain.  Please advise

## 2014-07-22 NOTE — Telephone Encounter (Signed)
Unable to reach pt will try again at a later time.  

## 2014-07-25 ENCOUNTER — Ambulatory Visit (INDEPENDENT_AMBULATORY_CARE_PROVIDER_SITE_OTHER): Payer: Medicare Other | Admitting: Endocrinology

## 2014-07-25 ENCOUNTER — Encounter: Payer: Self-pay | Admitting: Endocrinology

## 2014-07-25 DIAGNOSIS — I509 Heart failure, unspecified: Secondary | ICD-10-CM

## 2014-07-25 DIAGNOSIS — Z23 Encounter for immunization: Secondary | ICD-10-CM

## 2014-07-25 DIAGNOSIS — E0590 Thyrotoxicosis, unspecified without thyrotoxic crisis or storm: Secondary | ICD-10-CM

## 2014-07-25 DIAGNOSIS — Z7901 Long term (current) use of anticoagulants: Secondary | ICD-10-CM

## 2014-07-25 DIAGNOSIS — Z2911 Encounter for prophylactic immunotherapy for respiratory syncytial virus (RSV): Secondary | ICD-10-CM

## 2014-07-25 LAB — BASIC METABOLIC PANEL
BUN: 24 mg/dL — ABNORMAL HIGH (ref 6–23)
CHLORIDE: 109 meq/L (ref 96–112)
CO2: 27 meq/L (ref 19–32)
CREATININE: 1 mg/dL (ref 0.4–1.5)
Calcium: 8.8 mg/dL (ref 8.4–10.5)
GFR: 72.42 mL/min (ref >60.00)
Glucose, Bld: 99 mg/dL (ref 70–99)
POTASSIUM: 4 meq/L (ref 3.5–5.1)
Sodium: 141 mEq/L (ref 135–145)

## 2014-07-25 LAB — CBC WITH DIFFERENTIAL/PLATELET
BASOS ABS: 0 10*3/uL (ref 0.0–0.1)
Basophils Relative: 0.4 % (ref 0.0–3.0)
Eosinophils Absolute: 0.2 10*3/uL (ref 0.0–0.7)
Eosinophils Relative: 3.8 % (ref 0.0–5.0)
HCT: 34.7 % — ABNORMAL LOW (ref 39.0–52.0)
Hemoglobin: 11.3 g/dL — ABNORMAL LOW (ref 13.0–17.0)
LYMPHS ABS: 1 10*3/uL (ref 0.7–4.0)
Lymphocytes Relative: 21.5 % (ref 12.0–46.0)
MCHC: 32.5 g/dL (ref 30.0–36.0)
MCV: 91.4 fl (ref 78.0–100.0)
Monocytes Absolute: 0.5 10*3/uL (ref 0.1–1.0)
Monocytes Relative: 10 % (ref 3.0–12.0)
Neutro Abs: 2.9 10*3/uL (ref 1.4–7.7)
Neutrophils Relative %: 64.3 % (ref 43.0–77.0)
Platelets: 143 10*3/uL — ABNORMAL LOW (ref 150.0–400.0)
RBC: 3.79 Mil/uL — ABNORMAL LOW (ref 4.22–5.81)
RDW: 15.5 % (ref 11.5–15.5)
WBC: 4.6 10*3/uL (ref 4.0–10.5)

## 2014-07-25 LAB — IBC PANEL
Iron: 149 ug/dL (ref 42–165)
Saturation Ratios: 42.5 % (ref 20.0–50.0)
Transferrin: 250.7 mg/dL (ref 212.0–360.0)

## 2014-07-25 LAB — TSH: TSH: 1.97 u[IU]/mL (ref 0.35–4.50)

## 2014-07-25 LAB — VITAMIN D 25 HYDROXY (VIT D DEFICIENCY, FRACTURES): VITD: 33.65 ng/mL (ref 30.00–100.00)

## 2014-07-25 LAB — BRAIN NATRIURETIC PEPTIDE: PRO B NATRI PEPTIDE: 214 pg/mL — AB (ref 0.0–100.0)

## 2014-07-25 MED ORDER — FUROSEMIDE 20 MG PO TABS: 20.0000 mg | ORAL_TABLET | Freq: Every day | ORAL | Status: DC

## 2014-07-25 NOTE — Progress Notes (Signed)
Subjective:    Patient ID: [name] [name], male    DOB: 28-Nov-1928, 80 y.o.   MRN: [id number]  HPI Pt states few years of moderate bruising at the forearms.  He now has 2 weeks of slight assoc pain and swelling Past Medical History  Diagnosis Date  . GASTRIC POLYP 05/13/2009  . COLONIC POLYPS, ADENOMATOUS 01/14/2008  . HYPERLIPIDEMIA 06/01/2007  . ANEMIA, IRON DEFICIENCY 10/13/2008  . COAGULOPATHY, COUMADIN-INDUCED 01/14/2008  . ANXIETY 09/04/2008  . HYPERTENSION 06/01/2007  . ATRIAL FIBRILLATION, CHRONIC 01/14/2008    on warfarin since 2007  . UNSPECIFIED VENOUS INSUFFICIENCY 09/18/2008  . PLEURAL EFFUSION, LEFT 10/13/2008  . GERD 06/01/2007  . BARRETTS ESOPHAGUS 03/20/2009  . PROSTATE CANCER, HX OF 01/14/2008  . CATARACTS, BILATERAL, HX OF 01/14/2008  . CEREBROVASCULAR ACCIDENT, HX OF 08/18/2008  . PERSONAL HX COLONIC POLYPS 01/01/2010  . BASAL CELL CARCINOMA OF SKIN SITE UNSPECIFIED 09/28/2010  . Hyperglycemia   . Elevated PSA   . Restless leg syndrome   . Mitral regurgitation 06/13/2008    mitral valve replacement #20 St. Jude Biocor; Maze procedure by Dr. Amador Cunas  at [facility]  . CORONARY ARTERY DISEASE 06/01/2007    Cath 04/03/2008; Cath 11/01/1990  . Tachy-brady syndrome 07/13/2008    medtronic Adapta gen. model ADDR01 ,serial # [id number] H by Dr  [name] at [facility]  . Peripheral artery disease 06/16/2010    LEA doppler-left ABI nml 0.61 calcified vessels;right ABI  0.68  . Edema, peripheral 07/30/2009    LEV doppler- no thrombus or thrombophlebitis  . Aortic valvular disorder 08/16/2012    echo EF 45-50% LV mildly reduce,Biocor mitral valve,mild to mod. tricuspid regrug.,mild to mod. aortic regrug.  . Mitral valve disorder 09/08/2011    echo EF 50-55% LV normal  . Aortic valvular stenosis 11/19/2013  . Cardiomyopathy, ischemic 11/19/2013    EF 45-50% with inferior wall hypokinesis by echo 2014  . S/P CABG x 1 06/13/2008    SVG to LAD with open vein harvest right thigh, LIMA harvested  but not utilized by Dr Amador Cunas  . S/P mitral valve replacement with bioprosthetic valve 06/13/2008    76mm St Jude Biocor porcine bioprosthetic tissue valve by Dr Amador Cunas  . S/P Maze operation for atrial fibrillation 06/13/2008    Partial left atrial lesion set using cryothermy for bilateral pulmonary vein isolation by Dr Amador Cunas  . Prosthetic valve dysfunction     Mitral stenosis involving bioprosthetic tissue valve placed 06/13/2008  . Mitral stenosis     Porcine bioprosthetic tissue valve placed 06/13/2008  . Dysrhythmia     HX OF TACHY BRADY  . Pacemaker   . HYPERTHYROIDISM 01/06/2011  . 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 Prostatectomy    . Tonsillectomy    . Cystectomy      Left leg  . Pacemaker placement  07/13/2008    [place] dual chamber-Medtronic at [facility]   . Coronary artery bypass graft  06/13/2008    graft x1 w/vein graft LAD artery by Dr. Amador Cunas at [facility]  . Mitral valve replacement  06/13/2008    #20 St. Jude Bicor mitral valve ;Maze procedure at [facility] by Dr. Amador Cunas  . Cardiac catheterization  11/01/1990  . Skin cancer excision      removed from forehead and right leg, nose/face  . Upper  gastrointestinal endoscopy  07/14/2009    erosive reflux esopagitis, Barrett's esophagus  . Colonoscopy  07/14/2009    diverticulosis, internal hemorrhoids  . Cardiovascular stress test  06/03/2010    Persantine perfusion EF 45% mild to moderate ischemia basal and mid inferolateral region  . Cardiovascular stress test  01/01/2008    left ventricle normal,no significant ischemia  . Cardiovascular stress test  09/09/2005     possibility of ischemia inferior wall  . Tee without cardioversion N/A 01/22/2014    Procedure: TRANSESOPHAGEAL ECHOCARDIOGRAM (TEE);  Surgeon: Sanda Klein, MD;  Location: [facility];  Service:  Cardiovascular;  Laterality: N/A;  . Coronary stent placement  03/24/2014    DES to LAD  . Transcatheter aortic valve replacement, transfemoral N/A 04/22/2014    Procedure: TRANSCATHETER AORTIC VALVE REPLACEMENT, TRANSFEMORAL;  Surgeon: [name], MD;  Location: [facility];  Service: Open Heart Surgery;  Laterality: N/A;  TAVR-TF (RIGHT SIDE)  . Intraoperative transesophageal echocardiogram N/A 04/22/2014    Procedure: INTRAOPERATIVE TRANSTHORACIC ECHOCARDIOGRAM;  Surgeon: [name], MD;  Location: [place];  Service: Open Heart Surgery;  Laterality: N/A;    History   Social History  . Marital Status: Married    Spouse Name: N/A    Number of Children: 4  . Years of Education: N/A   Occupational History  . Retired    Social History Main Topics  . Smoking status: Former Smoker -- 2 years    Types: Pipe    Quit date: 11/07/1965  . Smokeless tobacco: Never Used     Comment: quit over 40 years ago, never smoked cigarettes  . Alcohol Use: No  . Drug Use: No  . Sexual Activity: Not on file   Other Topics Concern  . Not on file   Social History Narrative  . No narrative on file    Current Outpatient Prescriptions on File Prior to Visit  Medication Sig Dispense Refill  . amiodarone (PACERONE) 200 MG tablet Take 1 tablet (200 mg total) by mouth daily.  90 tablet  3  . clopidogrel (PLAVIX) 75 MG tablet Take 75 mg by mouth daily with breakfast.      . ferrous sulfate 325 (65 FE) MG tablet Take 325 mg by mouth daily with breakfast.      . methimazole (TAPAZOLE) 5 MG tablet Take 5 mg by mouth every Monday, Wednesday, and Friday.       . metoprolol succinate (TOPROL-XL) 50 MG 24 hr tablet Take 1 tablet (50 mg total) by mouth daily. Take with or immediately following a meal.  30 tablet  5  . Multiple Vitamin (MULTIVITAMIN WITH MINERALS) TABS Take 1 tablet by mouth daily. Centrum Silver      . pantoprazole (PROTONIX) 40 MG tablet Take 40 mg by mouth 2 (two) times daily.      . simvastatin  (ZOCOR) 40 MG tablet Take 1 tablet (40 mg total) by mouth at bedtime.  90 tablet  0  . Tamsulosin HCl (FLOMAX) 0.4 MG CAPS Take 0.4 mg by mouth every evening.        No current facility-administered medications on file prior to visit.    No Known Allergies  Family History  Problem Relation Age of Onset  . Cancer Mother     Breast Cancer    BP 122/86  Pulse 69  Temp(Src) 97.5 F (36.4 C) (Oral)  Ht 5\' 10"  (1.778 m)  Wt 177 lb (80.287 kg)  BMI 25.40 kg/m2  SpO2 97%    Review  of Systems Denies BRBPR, sob, and hematuria.    Objective:   Physical Exam VITAL SIGNS:  See vs page GENERAL: no distress Skin: forearms have moderate ecchymoses and AK's.   Ext: left forearm is slightly swollen, but no edema of the legs.    Lab Results  Component Value Date   CREATININE 1.0 07/25/2014   BUN 24* 07/25/2014   NA 141 07/25/2014   K 4.0 07/25/2014   CL 109 07/25/2014   CO2 27 07/25/2014   BNP: elevated Lab Results  Component Value Date   WBC 4.6 07/25/2014   HGB 11.3* 07/25/2014   HCT 34.7* 07/25/2014   MCV 91.4 07/25/2014   PLT 143.0* 07/25/2014      Assessment & Plan:  Anemia: improved CHF, mild Ecchymosis: chronic.  i told pt no rx is needed. Hyperthyroidism: well-controlled   Patient is advised the following: Patient Instructions  blood tests are being requested for you today.  We'll contact you with results.  Please come back for a regular physical appointment after 12/30/14.

## 2014-07-25 NOTE — Patient Instructions (Addendum)
blood tests are being requested for you today.  We'll contact you with results.  Please come back for a regular physical appointment after 12/30/14.

## 2014-07-28 LAB — PTH, INTACT AND CALCIUM
CALCIUM: 8.9 mg/dL (ref 8.4–10.5)
PTH: 31 pg/mL (ref 14–64)

## 2014-08-12 ENCOUNTER — Ambulatory Visit: Payer: Self-pay | Admitting: Pharmacist Clinician (PhC)/ Clinical Pharmacy Specialist

## 2014-08-12 DIAGNOSIS — I482 Chronic atrial fibrillation, unspecified: Secondary | ICD-10-CM

## 2014-08-19 ENCOUNTER — Emergency Department (HOSPITAL_COMMUNITY): Payer: Medicare Other

## 2014-08-19 ENCOUNTER — Emergency Department (HOSPITAL_COMMUNITY)
Admission: EM | Admit: 2014-08-19 | Discharge: 2014-08-19 | Disposition: A | Discharge status: Home / Self Care | Payer: Medicare Other | Attending: Emergency Medicine | Admitting: Emergency Medicine

## 2014-08-19 ENCOUNTER — Encounter (HOSPITAL_COMMUNITY): Payer: Self-pay | Admitting: Emergency Medicine

## 2014-08-19 DIAGNOSIS — Z950 Presence of cardiac pacemaker: Secondary | ICD-10-CM | POA: Insufficient documentation

## 2014-08-19 DIAGNOSIS — W1830XA Fall on same level, unspecified, initial encounter: Secondary | ICD-10-CM | POA: Insufficient documentation

## 2014-08-19 DIAGNOSIS — Z9889 Other specified postprocedural states: Secondary | ICD-10-CM | POA: Insufficient documentation

## 2014-08-19 DIAGNOSIS — Z8679 Personal history of other diseases of the circulatory system: Secondary | ICD-10-CM | POA: Diagnosis not present

## 2014-08-19 DIAGNOSIS — Z8673 Personal history of transient ischemic attack (TIA), and cerebral infarction without residual deficits: Secondary | ICD-10-CM | POA: Diagnosis not present

## 2014-08-19 DIAGNOSIS — Z951 Presence of aortocoronary bypass graft: Secondary | ICD-10-CM | POA: Insufficient documentation

## 2014-08-19 DIAGNOSIS — Z8639 Personal history of other endocrine, nutritional and metabolic disease: Secondary | ICD-10-CM | POA: Insufficient documentation

## 2014-08-19 DIAGNOSIS — Z7902 Long term (current) use of antithrombotics/antiplatelets: Secondary | ICD-10-CM | POA: Diagnosis not present

## 2014-08-19 DIAGNOSIS — I10 Essential (primary) hypertension: Secondary | ICD-10-CM | POA: Insufficient documentation

## 2014-08-19 DIAGNOSIS — E785 Hyperlipidemia, unspecified: Secondary | ICD-10-CM | POA: Insufficient documentation

## 2014-08-19 DIAGNOSIS — I2510 Atherosclerotic heart disease of native coronary artery without angina pectoris: Secondary | ICD-10-CM | POA: Insufficient documentation

## 2014-08-19 DIAGNOSIS — K219 Gastro-esophageal reflux disease without esophagitis: Secondary | ICD-10-CM | POA: Insufficient documentation

## 2014-08-19 DIAGNOSIS — Z8659 Personal history of other mental and behavioral disorders: Secondary | ICD-10-CM | POA: Diagnosis not present

## 2014-08-19 DIAGNOSIS — Z4800 Encounter for change or removal of nonsurgical wound dressing: Secondary | ICD-10-CM | POA: Insufficient documentation

## 2014-08-19 DIAGNOSIS — S81811A Laceration without foreign body, right lower leg, initial encounter: Secondary | ICD-10-CM | POA: Diagnosis not present

## 2014-08-19 DIAGNOSIS — Z8669 Personal history of other diseases of the nervous system and sense organs: Secondary | ICD-10-CM | POA: Insufficient documentation

## 2014-08-19 DIAGNOSIS — Z5189 Encounter for other specified aftercare: Secondary | ICD-10-CM

## 2014-08-19 DIAGNOSIS — Z8709 Personal history of other diseases of the respiratory system: Secondary | ICD-10-CM | POA: Insufficient documentation

## 2014-08-19 DIAGNOSIS — Z8546 Personal history of malignant neoplasm of prostate: Secondary | ICD-10-CM | POA: Insufficient documentation

## 2014-08-19 DIAGNOSIS — Z79899 Other long term (current) drug therapy: Secondary | ICD-10-CM | POA: Diagnosis not present

## 2014-08-19 DIAGNOSIS — Z87891 Personal history of nicotine dependence: Secondary | ICD-10-CM | POA: Diagnosis not present

## 2014-08-19 DIAGNOSIS — Y9289 Other specified places as the place of occurrence of the external cause: Secondary | ICD-10-CM | POA: Diagnosis not present

## 2014-08-19 DIAGNOSIS — Z954 Presence of other heart-valve replacement: Secondary | ICD-10-CM | POA: Insufficient documentation

## 2014-08-19 DIAGNOSIS — E0590 Thyrotoxicosis, unspecified without thyrotoxic crisis or storm: Secondary | ICD-10-CM | POA: Insufficient documentation

## 2014-08-19 DIAGNOSIS — S8991XA Unspecified injury of right lower leg, initial encounter: Secondary | ICD-10-CM | POA: Diagnosis present

## 2014-08-19 DIAGNOSIS — D509 Iron deficiency anemia, unspecified: Secondary | ICD-10-CM | POA: Insufficient documentation

## 2014-08-19 DIAGNOSIS — Y9389 Activity, other specified: Secondary | ICD-10-CM | POA: Insufficient documentation

## 2014-08-19 DIAGNOSIS — S81812A Laceration without foreign body, left lower leg, initial encounter: Secondary | ICD-10-CM

## 2014-08-19 DIAGNOSIS — Z86010 Personal history of colonic polyps: Secondary | ICD-10-CM | POA: Insufficient documentation

## 2014-08-19 DIAGNOSIS — S30810A Abrasion of lower back and pelvis, initial encounter: Secondary | ICD-10-CM | POA: Insufficient documentation

## 2014-08-19 DIAGNOSIS — Z953 Presence of xenogenic heart valve: Secondary | ICD-10-CM | POA: Diagnosis not present

## 2014-08-19 DIAGNOSIS — I482 Chronic atrial fibrillation: Secondary | ICD-10-CM | POA: Diagnosis not present

## 2014-08-19 DIAGNOSIS — Z85828 Personal history of other malignant neoplasm of skin: Secondary | ICD-10-CM | POA: Insufficient documentation

## 2014-08-19 DIAGNOSIS — W19XXXA Unspecified fall, initial encounter: Secondary | ICD-10-CM

## 2014-08-19 LAB — BASIC METABOLIC PANEL
Anion gap: 12 (ref 5–15)
BUN: 22 mg/dL (ref 6–23)
CO2: 23 meq/L (ref 19–32)
Calcium: 8.7 mg/dL (ref 8.4–10.5)
Chloride: 110 mEq/L (ref 96–112)
Creatinine, Ser: 0.8 mg/dL (ref 0.50–1.35)
GFR calc Af Amer: >90 mL/min (ref >90)
GFR calc non Af Amer: 80 mL/min — ABNORMAL LOW (ref >90)
GLUCOSE: 101 mg/dL — AB (ref 70–99)
POTASSIUM: 4.1 meq/L (ref 3.7–5.3)
Sodium: 145 mEq/L (ref 137–147)

## 2014-08-19 LAB — CBC WITH DIFFERENTIAL/PLATELET
Basophils Absolute: 0 10*3/uL (ref 0.0–0.1)
Basophils Relative: 1 % (ref 0–1)
EOS PCT: 5 % (ref 0–5)
Eosinophils Absolute: 0.3 10*3/uL (ref 0.0–0.7)
HCT: 34.7 % — ABNORMAL LOW (ref 39.0–52.0)
Hemoglobin: 11.3 g/dL — ABNORMAL LOW (ref 13.0–17.0)
LYMPHS PCT: 14 % (ref 12–46)
Lymphs Abs: 0.9 10*3/uL (ref 0.7–4.0)
MCH: 30 pg (ref 26.0–34.0)
MCHC: 32.6 g/dL (ref 30.0–36.0)
MCV: 92 fL (ref 78.0–100.0)
MONOS PCT: 7 % (ref 3–12)
Monocytes Absolute: 0.4 10*3/uL (ref 0.1–1.0)
NEUTROS PCT: 74 % (ref 43–77)
Neutro Abs: 4.8 10*3/uL (ref 1.7–7.7)
PLATELETS: 113 10*3/uL — AB (ref 150–400)
RBC: 3.77 MIL/uL — AB (ref 4.22–5.81)
RDW: 14.3 % (ref 11.5–15.5)
WBC: 6.5 10*3/uL (ref 4.0–10.5)

## 2014-08-19 MED ORDER — HYDROCODONE-ACETAMINOPHEN 5-325 MG PO TABS: 1.0000 | ORAL_TABLET | Freq: Four times a day (QID) | ORAL | Status: DC | PRN

## 2014-08-19 MED ORDER — LIDOCAINE HCL 2 % IJ SOLN
20.0000 mL | Freq: Once | INTRAMUSCULAR | Status: DC
  Filled 2014-08-19: qty 20

## 2014-08-19 MED ORDER — MORPHINE SULFATE 4 MG/ML IJ SOLN
4.0000 mg | Freq: Once | INTRAMUSCULAR | Status: AC
  Administered 2014-08-19: 4 mg via INTRAVENOUS
  Filled 2014-08-19: qty 1

## 2014-08-19 MED ORDER — CEPHALEXIN 500 MG PO CAPS: 500.0000 mg | ORAL_CAPSULE | Freq: Four times a day (QID) | ORAL | Status: AC

## 2014-08-19 MED ORDER — CEFAZOLIN SODIUM 1-5 GM-% IV SOLN
1.0000 g | Freq: Once | INTRAVENOUS | Status: AC
  Administered 2014-08-19: 1 g via INTRAVENOUS
  Filled 2014-08-19: qty 50

## 2014-08-19 NOTE — ED Notes (Addendum)
After Robina Ade, Res MD finished suturing laceration to the right shin area of the right leg and applying steri strips to the left shin area of the left leg, cleaned said areas with a combination of normal saline and hydrogen peroxide and patted dry with sterile 4x4 guaze then applied non adherent gauze strips, then applied non adherent abd pads and placed 4  4x4 sterile gauzes, wrapped with gauze roll and taped; fully explained cleaning process for both lacerations and pt acknowledged he understood on how to clean properly; gave pt extra gauze (strips, dry, roll and non adherent), non adherent abd pads and tape for him to use at home; Robina Ade, Res MD and Robie Ridge, RN aware
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
## 2014-08-19 NOTE — ED Notes (Signed)
[name] [name], Res MD cleaning laceration to the left shin of pt's leg

## 2014-08-19 NOTE — Discharge Instructions (Signed)
Deep Skin Avulsion A deep skin avulsion is when all layers of the skin or parts of body structures have been torn away. This is usually a result of severe injury (trauma). A deep skin avulsion can include damage to important structures beneath the skin such as tendons, ligaments, nerves, or blood vessels.  CAUSES  Many injuries can lead to a deep skin avulsion. These include:   Crush injuries.  Bites.  Falls against jagged surfaces.  Gunshot wounds.  Severe burns and injuries involving dragging (such as those from a bicycle or motorcycle accident). TREATMENT   If the wound is small and there is no damage to vital structures like nerves and blood vessels, the damaged tissues may be removed. Then, the wound can be cleaned thoroughly and closed.  A skin graft may be performed. This is a procedure in which the outer layer of skin is removed from a different part of your body. That skin (skin graft) is used to cover the open wound. This can happen after damaged tissue is removed and repairs are completed.  Your caregiver may onlyapply a bandage (dressing) to the wound. The wound will be kept clean and allowed to heal. Healing can take weeks or months and usually leaves a large scar. This type of treatment is only done if your caregiver feels that skin grafting or a similar procedure would not work. You might need a tetanus shot if:  You cannot remember when you had your last tetanus shot.  You have never had a tetanus shot.  The injury broke your skin. If you got a tetanus shot, your arm may swell, get red, and feel warm to the touch. This is common and not a problem. If you need a tetanus shot and you choose not to have one, there is a rare chance of getting tetanus. Sickness from tetanus can be serious. HOME CARE INSTRUCTIONS   Only take over-the-counter or prescription medicines for pain, discomfort, or fever as directed by your caregiver.  Gently wash the area with mild soap and  water 2 times a day, or as directed. Rinse off the soap. Pat the area dry with a clean towel. Do not rub the wound. This may cause bleeding.  Follow your caregiver's instructions for how often you need to change the dressing.  Apply ointment and a dressing to the wound as directed.  If the dressing sticks, moisten it with soapy water and gently remove it.  Change the bandage right away if it becomes wet, dirty, or starts to smell bad.  Take showers. Do not take tub baths, swim, or do anything that may soak the wound until it is healed.  Use anti-itch medicine as directed by your caregiver. The wound may itch when it is healing. Do not pick or scratch at the wound.  Follow up with your caregiver for stitches (sutures), staple, or skin adhesive strip removal. SEEK MEDICAL CARE IF:   You have redness, swelling, or increasing pain in your wound.  A red streak or line extends away from the wound.  You have pus coming from the wound.  You notice a bad smell coming from thewound or dressing.  The wound breaks open (edges not staying together) after sutures have been removed.  You notice something coming out of the wound, such as a small piece of wood, glass, or metal.  You are unable to properly move a finger or toe if the wound is on your hand or foot.  You have severe  swelling around the wound that causes pain and numbness. °· Your arm, hand, leg, or foot changes color. °SEEK IMMEDIATE MEDICAL CARE IF:  °· Your pain becomes severe or is not adequately relieved with pain medicine. °· You have a fever. °· You have nausea and vomiting for more than 24 hours. °· You feel lightheaded, weak, or faint. °· You develop chest pain or difficulty breathing. °MAKE SURE YOU:  °· Understand these instructions. °· Will watch your condition. °· Will get help right away if you are not doing well or get worse. °Document Released: 12/20/2006 Document Revised: 01/16/2012 Document Reviewed:  02/27/2011 °[employer]® Patient Information ©2015 [employer]. This information is not intended to replace advice given to you by your health care provider. Make sure you discuss any questions you have with your health care provider. ° °
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
## 2014-08-19 NOTE — ED Notes (Signed)
Assisting Kohut, MD and Robina Ade, Res MD with cleaning laceration to the right shin of pt's leg

## 2014-08-19 NOTE — ED Provider Notes (Signed)
CSN: [id number]     Arrival date & time 08/19/14  1841 History   First MD Initiated Contact with Patient 08/19/14 1905     Chief Complaint  Patient presents with  . Extremity Laceration    Patient is a 80 y.o. male presenting with wound check. The history is provided by the patient and the spouse.  Wound Check This is a new problem. The current episode started today. The problem occurs constantly. The problem has been gradually worsening. Pertinent negatives include no abdominal pain, chest pain, chills, coughing, fever, headaches, nausea, neck pain, rash, sore throat or vomiting. Associated symptoms comments: Bleeding. Nothing aggravates the symptoms. Treatments tried: Pressure dressings.   53-year-old male presents for wound check. Patient was seen earlier today for a laceration to his right lower leg. She returns now for worsening bleeding as he is on Plavix. He has been mentating appropriately. Wife did not know how to control the bleeding so she came  back to the ED.  Past Medical History  Diagnosis Date  . GASTRIC POLYP 05/13/2009  . COLONIC POLYPS, ADENOMATOUS 01/14/2008  . HYPERLIPIDEMIA 06/01/2007  . ANEMIA, IRON DEFICIENCY 10/13/2008  . COAGULOPATHY, COUMADIN-INDUCED 01/14/2008  . ANXIETY 09/04/2008  . HYPERTENSION 06/01/2007  . ATRIAL FIBRILLATION, CHRONIC 01/14/2008    on warfarin since 2007  . UNSPECIFIED VENOUS INSUFFICIENCY 09/18/2008  . PLEURAL EFFUSION, LEFT 10/13/2008  . GERD 06/01/2007  . BARRETTS ESOPHAGUS 03/20/2009  . PROSTATE CANCER, HX OF 01/14/2008  . CATARACTS, BILATERAL, HX OF 01/14/2008  . CEREBROVASCULAR ACCIDENT, HX OF 08/18/2008  . PERSONAL HX COLONIC POLYPS 01/01/2010  . BASAL CELL CARCINOMA OF SKIN SITE UNSPECIFIED 09/28/2010  . Hyperglycemia   . Elevated PSA   . Restless leg syndrome   . Mitral regurgitation 06/13/2008    mitral valve replacement #20 St. Jude Biocor; Maze procedure by Dr. Amador Cunas  at [facility]  . CORONARY ARTERY DISEASE 06/01/2007    Cath 04/03/2008;  Cath 11/01/1990  . Tachy-brady syndrome 07/13/2008    medtronic Adapta gen. model ADDR01 ,serial # [id number] H by Dr  [name] at [facility]  . Peripheral artery disease 06/16/2010    LEA doppler-left ABI nml 0.61 calcified vessels;right ABI  0.68  . Edema, peripheral 07/30/2009    LEV doppler- no thrombus or thrombophlebitis  . Aortic valvular disorder 08/16/2012    echo EF 45-50% LV mildly reduce,Biocor mitral valve,mild to mod. tricuspid regrug.,mild to mod. aortic regrug.  . Mitral valve disorder 09/08/2011    echo EF 50-55% LV normal  . Aortic valvular stenosis 11/19/2013  . Cardiomyopathy, ischemic 11/19/2013    EF 45-50% with inferior wall hypokinesis by echo 2014  . S/P CABG x 1 06/13/2008    SVG to LAD with open vein harvest right thigh, LIMA harvested but not utilized by Dr Amador Cunas  . S/P mitral valve replacement with bioprosthetic valve 06/13/2008    57mm St Jude Biocor porcine bioprosthetic tissue valve by Dr Amador Cunas  . S/P Maze operation for atrial fibrillation 06/13/2008    Partial left atrial lesion set using cryothermy for bilateral pulmonary vein isolation by Dr Amador Cunas  . Prosthetic valve dysfunction     Mitral stenosis involving bioprosthetic tissue valve placed 06/13/2008  . Mitral stenosis     Porcine bioprosthetic tissue valve placed 06/13/2008  . Dysrhythmia     HX OF TACHY BRADY  . Pacemaker   . HYPERTHYROIDISM 01/06/2011  . S/P TAVR (transcatheter aortic valve replacement) 04/22/2014    29 mm Edwards Sapien XT  transcatheter heart valve placed via open right transfemoral approach   Past Surgical History  Procedure Laterality Date  . Prostatectomy    . Tonsillectomy    . Cystectomy      Left leg  . Pacemaker placement  07/13/2008    Adapata dual chamber-Medtronic at [facility]   . Coronary artery bypass graft  06/13/2008    graft x1 w/vein graft LAD artery by Dr. Amador Cunas at [facility]  . Mitral valve replacement  06/13/2008    #20 St. Jude Bicor mitral valve ;Maze procedure  at [facility] by Dr. Amador Cunas  . Cardiac catheterization  11/01/1990  . Skin cancer excision      removed from forehead and right leg, nose/face  . Upper gastrointestinal endoscopy  07/14/2009    erosive reflux esopagitis, Barrett's esophagus  . Colonoscopy  07/14/2009    diverticulosis, internal hemorrhoids  . Cardiovascular stress test  06/03/2010    Persantine perfusion EF 45% mild to moderate ischemia basal and mid inferolateral region  . Cardiovascular stress test  01/01/2008    left ventricle normal,no significant ischemia  . Cardiovascular stress test  09/09/2005     possibility of ischemia inferior wall  . Tee without cardioversion N/A 01/22/2014    Procedure: TRANSESOPHAGEAL ECHOCARDIOGRAM (TEE);  Surgeon: Sanda Klein, MD;  Location: [facility] ENDOSCOPY;  Service: Cardiovascular;  Laterality: N/A;  . Coronary stent placement  03/24/2014    DES to LAD  . Transcatheter aortic valve replacement, transfemoral N/A 04/22/2014    Procedure: TRANSCATHETER AORTIC VALVE REPLACEMENT, TRANSFEMORAL;  Surgeon: [name], MD;  Location: [place];  Service: Open Heart Surgery;  Laterality: N/A;  TAVR-TF (RIGHT SIDE)  . Intraoperative transesophageal echocardiogram N/A 04/22/2014    Procedure: INTRAOPERATIVE TRANSTHORACIC ECHOCARDIOGRAM;  Surgeon: [name], MD;  Location: [place];  Service: Open Heart Surgery;  Laterality: N/A;   Family History  Problem Relation Age of Onset  . Cancer Mother     Breast Cancer   History  Substance Use Topics  . Smoking status: Former Smoker -- 2 years    Types: Pipe    Quit date: 11/07/1965  . Smokeless tobacco: Never Used     Comment: quit over 40 years ago, never smoked cigarettes  . Alcohol Use: No    Review of Systems  Constitutional: Negative for fever and chills.  HENT: Negative for rhinorrhea and sore throat.   Eyes: Negative for visual disturbance.  Respiratory: Negative for cough and shortness of breath.   Cardiovascular: Negative for chest pain.   Gastrointestinal: Negative for nausea, vomiting, abdominal pain, diarrhea and constipation.  Genitourinary: Negative for dysuria and hematuria.  Musculoskeletal: Negative for back pain and neck pain.       Left leg pain  Skin: Negative for rash.  Neurological: Negative for syncope and headaches.  Hematological: Bruises/bleeds easily.  Psychiatric/Behavioral: Negative for confusion.  All other systems reviewed and are negative.  Allergies  Review of patient's allergies indicates no known allergies.  Home Medications   Prior to Admission medications   Medication Sig Start Date End Date Taking? Authorizing Provider  amiodarone (PACERONE) 200 MG tablet Take 1 tablet (200 mg total) by mouth daily. 05/12/14  Yes [name] [name], MD  clopidogrel (PLAVIX) 75 MG tablet Take 75 mg by mouth daily with breakfast.   Yes Historical Provider, MD  ferrous sulfate 325 (65 FE) MG tablet Take 325 mg by mouth daily with breakfast.   Yes Historical Provider, MD  methimazole (TAPAZOLE) 5 MG tablet Take 5 mg by mouth  every Monday, Wednesday, and Friday.  02/25/14  Yes [name], MD  metoprolol succinate (TOPROL-XL) 50 MG 24 hr tablet Take 1 tablet (50 mg total) by mouth daily. Take with or immediately following a meal. 04/24/14  Yes Brittainy Erie Noe, PA-C  Multiple Vitamin (MULTIVITAMIN WITH MINERALS) TABS Take 1 tablet by mouth daily. Centrum Silver   Yes Historical Provider, MD  pantoprazole (PROTONIX) 40 MG tablet Take 40 mg by mouth 2 (two) times daily.   Yes Historical Provider, MD  simvastatin (ZOCOR) 40 MG tablet Take 1 tablet (40 mg total) by mouth at bedtime. 07/21/14  Yes [name], MD  Tamsulosin HCl (FLOMAX) 0.4 MG CAPS Take 0.4 mg by mouth every evening.    Yes Historical Provider, MD  cephALEXin (KEFLEX) 500 MG capsule Take 1 capsule (500 mg total) by mouth 4 (four) times daily. 08/19/14 08/26/14  Renne Musca, MD  HYDROcodone-acetaminophen (NORCO/VICODIN) 5-325 MG per tablet Take 1 tablet by  mouth every 6 (six) hours as needed for moderate pain. 08/19/14   Renne Musca, MD   BP 147/92  Pulse 69  Temp(Src) 97.6 F (36.4 C) (Oral)  Resp 16  Ht 5\' 10"  (1.778 m)  Wt 170 lb (77.111 kg)  BMI 24.39 kg/m2  SpO2 96% Physical Exam  Constitutional: He is oriented to person, place, and time. He appears well-developed and well-nourished. No distress.  HENT:  Head: Normocephalic and atraumatic.  Mouth/Throat: Oropharynx is clear and moist.  Eyes: EOM are normal.  Neck: Neck supple. No JVD present.  Cardiovascular: Normal rate, regular rhythm, normal heart sounds and intact distal pulses.   Pulmonary/Chest: Effort normal and breath sounds normal.  Abdominal: Soft. He exhibits no distension. There is no tenderness.  Musculoskeletal: Normal range of motion. He exhibits no edema.  Right lower leg wound appears well repaired. There is venous oozing at the site. Pulses intact distally. Compartments are soft.  Neurological: He is alert and oriented to person, place, and time. No cranial nerve deficit.  Skin: Skin is warm and dry.  Psychiatric: His behavior is normal.   ED Course  Procedures None  MDM   Final diagnoses:  Visit for wound check   Patient presents to emergency department with persistent bleeding from leg wound that was repaired in this ED earlier today. Patient is on Plavix. Patient is mentating appropriately. He has normal sensation and strength and pulses in his right foot. Pressure dressing was applied with ABDs and Coban. Hemostasis was maintained. Patient was reassessed and the dressing appears dry. Wound care instructions and strict return cautions were given. Patient stable for discharge home at this time. The patient and family's questions were addressed.  Case discussed with Dr. Jeneen Rinks .     Gustavus Bryant, MD 08/19/14 [phone]
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
## 2014-08-19 NOTE — ED Notes (Signed)
Dr Robina Ade at bedside suturing pt
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
## 2014-08-19 NOTE — ED Notes (Signed)
Robina Ade, Res MD has finished suturing the right (shin) leg now starting on the left shin area of the leg

## 2014-08-19 NOTE — ED Notes (Signed)
Patient reports to ED for active bleeding/oozing to wound of right lower leg.  Patient was seen in ED earlier today for laceration to right lower leg and sutures were placed.  Patient reports he returned home and "I just kept bleeding."  According to patient and family, numerous dressings had been changed but the bleeding could not be controlled.  Wound is oozing upon assessment.

## 2014-08-19 NOTE — ED Notes (Signed)
Pt given a warm blanket; assisting Robina Ade, MD

## 2014-08-19 NOTE — ED Notes (Signed)
Robina Ade, Res MD steri striped laceration on pt's left shin area

## 2014-08-21 ENCOUNTER — Telehealth: Payer: Self-pay | Admitting: Endocrinology

## 2014-08-21 NOTE — Telephone Encounter (Signed)
See below and please advise, Thanks!  

## 2014-08-21 NOTE — Telephone Encounter (Signed)
Pt coming for appointment at 2:45 tomorrow.

## 2014-08-21 NOTE — Telephone Encounter (Signed)
Ov tomorrow 

## 2014-08-21 NOTE — Telephone Encounter (Signed)
Pt fell Tuesday 10/15 in the am and he did go to the ER they put some wraps on his legs and they need to know when they can take this off

## 2014-08-22 ENCOUNTER — Ambulatory Visit (INDEPENDENT_AMBULATORY_CARE_PROVIDER_SITE_OTHER): Payer: Medicare Other | Admitting: Endocrinology

## 2014-08-22 ENCOUNTER — Encounter: Payer: Self-pay | Admitting: Endocrinology

## 2014-08-22 VITALS — BP 124/70 | HR 86 | Temp 97.9°F | Ht 70.0 in | Wt 176.0 lb

## 2014-08-22 DIAGNOSIS — S81811D Laceration without foreign body, right lower leg, subsequent encounter: Secondary | ICD-10-CM

## 2014-08-22 NOTE — Patient Instructions (Signed)
Please continue to keep the dressing clean and dry.   Please come back for a follow-up appointment in 3 days.   You will get better much faster if you elevate your foot above the rest of your body.

## 2014-08-22 NOTE — Progress Notes (Signed)
Subjective:    Patient ID: [name] [name], male    DOB: 08/08/1929, 80 y.o.   MRN: [id number]  HPI 3 days ago, pt was seed in ER for moderate bleeding from the right leg.  This started in the context of a fall.  He has assoc chest-wall contusion. Past Medical History  Diagnosis Date  . GASTRIC POLYP 05/13/2009  . COLONIC POLYPS, ADENOMATOUS 01/14/2008  . HYPERLIPIDEMIA 06/01/2007  . ANEMIA, IRON DEFICIENCY 10/13/2008  . COAGULOPATHY, COUMADIN-INDUCED 01/14/2008  . ANXIETY 09/04/2008  . HYPERTENSION 06/01/2007  . ATRIAL FIBRILLATION, CHRONIC 01/14/2008    on warfarin since 2007  . UNSPECIFIED VENOUS INSUFFICIENCY 09/18/2008  . PLEURAL EFFUSION, LEFT 10/13/2008  . GERD 06/01/2007  . BARRETTS ESOPHAGUS 03/20/2009  . PROSTATE CANCER, HX OF 01/14/2008  . CATARACTS, BILATERAL, HX OF 01/14/2008  . CEREBROVASCULAR ACCIDENT, HX OF 08/18/2008  . PERSONAL HX COLONIC POLYPS 01/01/2010  . BASAL CELL CARCINOMA OF SKIN SITE UNSPECIFIED 09/28/2010  . Hyperglycemia   . Elevated PSA   . Restless leg syndrome   . Mitral regurgitation 06/13/2008    mitral valve replacement #20 St. Jude Biocor; Maze procedure by Dr. Amador Cunas  at [facility]  . CORONARY ARTERY DISEASE 06/01/2007    Cath 04/03/2008; Cath 11/01/1990  . Tachy-brady syndrome 07/13/2008    medtronic Adapta gen. model ADDR01 ,serial # [id number] H by Dr  [name] at [facility]  . Peripheral artery disease 06/16/2010    LEA doppler-left ABI nml 0.61 calcified vessels;right ABI  0.68  . Edema, peripheral 07/30/2009    LEV doppler- no thrombus or thrombophlebitis  . Aortic valvular disorder 08/16/2012    echo EF 45-50% LV mildly reduce,Biocor mitral valve,mild to mod. tricuspid regrug.,mild to mod. aortic regrug.  . Mitral valve disorder 09/08/2011    echo EF 50-55% LV normal  . Aortic valvular stenosis 11/19/2013  . Cardiomyopathy, ischemic 11/19/2013    EF 45-50% with inferior wall hypokinesis by echo 2014  . S/P CABG x 1 06/13/2008    SVG to LAD with open vein  harvest right thigh, LIMA harvested but not utilized by Dr Amador Cunas  . S/P mitral valve replacement with bioprosthetic valve 06/13/2008    39mm St Jude Biocor porcine bioprosthetic tissue valve by Dr Amador Cunas  . S/P Maze operation for atrial fibrillation 06/13/2008    Partial left atrial lesion set using cryothermy for bilateral pulmonary vein isolation by Dr Amador Cunas  . Prosthetic valve dysfunction     Mitral stenosis involving bioprosthetic tissue valve placed 06/13/2008  . Mitral stenosis     Porcine bioprosthetic tissue valve placed 06/13/2008  . Dysrhythmia     HX OF TACHY BRADY  . Pacemaker   . HYPERTHYROIDISM 01/06/2011  . 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 Prostatectomy    . Tonsillectomy    . Cystectomy      Left leg  . Pacemaker placement  07/13/2008    Adapata dual chamber-Medtronic at [facility]   . Coronary artery bypass graft  06/13/2008    graft x1 w/vein graft LAD artery by Dr. Amador Cunas at [facility]  . Mitral valve replacement  06/13/2008    #20 St. Jude Bicor mitral valve ;Maze procedure at [facility] by Dr. Amador Cunas  . Cardiac catheterization  11/01/1990  . Skin cancer excision      removed from  forehead and right leg, nose/face  . Upper gastrointestinal endoscopy  07/14/2009    erosive reflux esopagitis, Barrett's esophagus  . Colonoscopy  07/14/2009    diverticulosis, internal hemorrhoids  . Cardiovascular stress test  06/03/2010    Persantine perfusion EF 45% mild to moderate ischemia basal and mid inferolateral region  . Cardiovascular stress test  01/01/2008    left ventricle normal,no significant ischemia  . Cardiovascular stress test  09/09/2005     possibility of ischemia inferior wall  . Tee without cardioversion N/A 01/22/2014    Procedure: TRANSESOPHAGEAL ECHOCARDIOGRAM (TEE);  Surgeon: Sanda Klein, MD;   Location: [facility] ENDOSCOPY;  Service: Cardiovascular;  Laterality: N/A;  . Coronary stent placement  03/24/2014    DES to LAD  . Transcatheter aortic valve replacement, transfemoral N/A 04/22/2014    Procedure: TRANSCATHETER AORTIC VALVE REPLACEMENT, TRANSFEMORAL;  Surgeon: [name], MD;  Location: [place];  Service: Open Heart Surgery;  Laterality: N/A;  TAVR-TF (RIGHT SIDE)  . Intraoperative transesophageal echocardiogram N/A 04/22/2014    Procedure: INTRAOPERATIVE TRANSTHORACIC ECHOCARDIOGRAM;  Surgeon: [name], MD;  Location: [place];  Service: Open Heart Surgery;  Laterality: N/A;    History   Social History  . Marital Status: Married    Spouse Name: N/A    Number of Children: 4  . Years of Education: N/A   Occupational History  . Retired    Social History Main Topics  . Smoking status: Former Smoker -- 2 years    Types: Pipe    Quit date: 11/07/1965  . Smokeless tobacco: Never Used     Comment: quit over 40 years ago, never smoked cigarettes  . Alcohol Use: No  . Drug Use: No  . Sexual Activity: Not on file   Other Topics Concern  . Not on file   Social History Narrative  . No narrative on file    Current Outpatient Prescriptions on File Prior to Visit  Medication Sig Dispense Refill  . amiodarone (PACERONE) 200 MG tablet Take 1 tablet (200 mg total) by mouth daily.  90 tablet  3  . cephALEXin (KEFLEX) 500 MG capsule Take 1 capsule (500 mg total) by mouth 4 (four) times daily.  28 capsule  0  . clopidogrel (PLAVIX) 75 MG tablet Take 75 mg by mouth daily with breakfast.      . ferrous sulfate 325 (65 FE) MG tablet Take 325 mg by mouth daily with breakfast.      . HYDROcodone-acetaminophen (NORCO/VICODIN) 5-325 MG per tablet Take 1 tablet by mouth every 6 (six) hours as needed for moderate pain.  12 tablet  0  . methimazole (TAPAZOLE) 5 MG tablet Take 5 mg by mouth every Monday, Wednesday, and Friday.       . metoprolol succinate (TOPROL-XL) 50 MG 24 hr tablet Take  1 tablet (50 mg total) by mouth daily. Take with or immediately following a meal.  30 tablet  5  . Multiple Vitamin (MULTIVITAMIN WITH MINERALS) TABS Take 1 tablet by mouth daily. Centrum Silver      . pantoprazole (PROTONIX) 40 MG tablet Take 40 mg by mouth 2 (two) times daily.      . simvastatin (ZOCOR) 40 MG tablet Take 1 tablet (40 mg total) by mouth at bedtime.  90 tablet  0  . Tamsulosin HCl (FLOMAX) 0.4 MG CAPS Take 0.4 mg by mouth every evening.        No current facility-administered medications on file prior to visit.    No  Known Allergies  Family History  Problem Relation Age of Onset  . Cancer Mother     Breast Cancer    BP 124/70  Pulse 86  Temp(Src) 97.9 F (36.6 C) (Oral)  Ht 5\' 10"  (1.778 m)  Wt 176 lb (79.833 kg)  BMI 25.25 kg/m2  SpO2 96%  Review of Systems Denies dizziness and LOC    Objective:   Physical Exam VITAL SIGNS:  See vs page GENERAL: no distress Legs: bilat hyperpigmentation, varicosities, and eczematous skin.   Right leg: laceration/skin avulsion as described in ER.  Sutures are present: minimal bleeding.   Left leg: abrasions, with minimal bleeding Gait: normal and steady.    i have reviewed the following old records: ER notes.  Lab Results  Component Value Date   WBC 6.5 08/19/2014   HGB 11.3* 08/19/2014   HCT 34.7* 08/19/2014   MCV 92.0 08/19/2014   PLT 113* 08/19/2014       Assessment & Plan:  Laceration, new to me.   Mild anemia: stable.   Patient is advised the following: Patient Instructions  Please continue to keep the dressing clean and dry.   Please come back for a follow-up appointment in 3 days.   You will get better much faster if you elevate your foot above the rest of your body.

## 2014-08-24 NOTE — ED Provider Notes (Signed)
I saw and evaluated the patient, reviewed the resident's note and I agree with the findings and plan.   EKG Interpretation None       Virgel Manifold, MD 08/24/14 [phone]

## 2014-08-25 ENCOUNTER — Encounter: Payer: Self-pay | Admitting: Endocrinology

## 2014-08-25 ENCOUNTER — Ambulatory Visit (INDEPENDENT_AMBULATORY_CARE_PROVIDER_SITE_OTHER): Payer: Medicare Other | Admitting: Endocrinology

## 2014-08-25 VITALS — BP 128/82 | HR 70 | Temp 97.9°F | Ht 70.0 in | Wt 180.0 lb

## 2014-08-25 DIAGNOSIS — S81811D Laceration without foreign body, right lower leg, subsequent encounter: Secondary | ICD-10-CM

## 2014-08-25 NOTE — Progress Notes (Signed)
Subjective:    Patient ID: [name] [name], male    DOB: 1929-06-15, 80 y.o.   MRN: [id number]  HPI Last week, pt was seed in ER for a laceration/skin avulsion right leg.  He also has an abrasion on the left leg. Past Medical History  Diagnosis Date  . GASTRIC POLYP 05/13/2009  . COLONIC POLYPS, ADENOMATOUS 01/14/2008  . HYPERLIPIDEMIA 06/01/2007  . ANEMIA, IRON DEFICIENCY 10/13/2008  . COAGULOPATHY, COUMADIN-INDUCED 01/14/2008  . ANXIETY 09/04/2008  . HYPERTENSION 06/01/2007  . ATRIAL FIBRILLATION, CHRONIC 01/14/2008    on warfarin since 2007  . UNSPECIFIED VENOUS INSUFFICIENCY 09/18/2008  . PLEURAL EFFUSION, LEFT 10/13/2008  . GERD 06/01/2007  . BARRETTS ESOPHAGUS 03/20/2009  . PROSTATE CANCER, HX OF 01/14/2008  . CATARACTS, BILATERAL, HX OF 01/14/2008  . CEREBROVASCULAR ACCIDENT, HX OF 08/18/2008  . PERSONAL HX COLONIC POLYPS 01/01/2010  . BASAL CELL CARCINOMA OF SKIN SITE UNSPECIFIED 09/28/2010  . Hyperglycemia   . Elevated PSA   . Restless leg syndrome   . Mitral regurgitation 06/13/2008    mitral valve replacement #20 St. Jude Biocor; Maze procedure by Dr. Amador Cunas  at [facility]  . CORONARY ARTERY DISEASE 06/01/2007    Cath 04/03/2008; Cath 11/01/1990  . Tachy-brady syndrome 07/13/2008    medtronic Adapta gen. model ADDR01 ,serial # [id number] H by Dr  [name] at [facility]  . Peripheral artery disease 06/16/2010    LEA doppler-left ABI nml 0.61 calcified vessels;right ABI  0.68  . Edema, peripheral 07/30/2009    LEV doppler- no thrombus or thrombophlebitis  . Aortic valvular disorder 08/16/2012    echo EF 45-50% LV mildly reduce,Biocor mitral valve,mild to mod. tricuspid regrug.,mild to mod. aortic regrug.  . Mitral valve disorder 09/08/2011    echo EF 50-55% LV normal  . Aortic valvular stenosis 11/19/2013  . Cardiomyopathy, ischemic 11/19/2013    EF 45-50% with inferior wall hypokinesis by echo 2014  . S/P CABG x 1 06/13/2008    SVG to LAD with open vein harvest right thigh, LIMA harvested  but not utilized by Dr Amador Cunas  . S/P mitral valve replacement with bioprosthetic valve 06/13/2008    45mm St Jude Biocor porcine bioprosthetic tissue valve by Dr Amador Cunas  . S/P Maze operation for atrial fibrillation 06/13/2008    Partial left atrial lesion set using cryothermy for bilateral pulmonary vein isolation by Dr Amador Cunas  . Prosthetic valve dysfunction     Mitral stenosis involving bioprosthetic tissue valve placed 06/13/2008  . Mitral stenosis     Porcine bioprosthetic tissue valve placed 06/13/2008  . Dysrhythmia     HX OF TACHY BRADY  . Pacemaker   . HYPERTHYROIDISM 01/06/2011  . 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 Prostatectomy    . Tonsillectomy    . Cystectomy      Left leg  . Pacemaker placement  07/13/2008    Adapata dual chamber-Medtronic at [facility]   . Coronary artery bypass graft  06/13/2008    graft x1 w/vein graft LAD artery by Dr. Amador Cunas at [facility]  . Mitral valve replacement  06/13/2008    #20 St. Jude Bicor mitral valve ;Maze procedure at [facility] by Dr. Amador Cunas  . Cardiac catheterization  11/01/1990  . Skin cancer excision      removed from forehead and right leg, nose/face  .  Upper gastrointestinal endoscopy  07/14/2009    erosive reflux esopagitis, Barrett's esophagus  . Colonoscopy  07/14/2009    diverticulosis, internal hemorrhoids  . Cardiovascular stress test  06/03/2010    Persantine perfusion EF 45% mild to moderate ischemia basal and mid inferolateral region  . Cardiovascular stress test  01/01/2008    left ventricle normal,no significant ischemia  . Cardiovascular stress test  09/09/2005     possibility of ischemia inferior wall  . Tee without cardioversion N/A 01/22/2014    Procedure: TRANSESOPHAGEAL ECHOCARDIOGRAM (TEE);  Surgeon: Sanda Klein, MD;  Location: [facility] ENDOSCOPY;  Service:  Cardiovascular;  Laterality: N/A;  . Coronary stent placement  03/24/2014    DES to LAD  . Transcatheter aortic valve replacement, transfemoral N/A 04/22/2014    Procedure: TRANSCATHETER AORTIC VALVE REPLACEMENT, TRANSFEMORAL;  Surgeon: [name], MD;  Location: [place];  Service: Open Heart Surgery;  Laterality: N/A;  TAVR-TF (RIGHT SIDE)  . Intraoperative transesophageal echocardiogram N/A 04/22/2014    Procedure: INTRAOPERATIVE TRANSTHORACIC ECHOCARDIOGRAM;  Surgeon: [name], MD;  Location: [place];  Service: Open Heart Surgery;  Laterality: N/A;    History   Social History  . Marital Status: Married    Spouse Name: N/A    Number of Children: 4  . Years of Education: N/A   Occupational History  . Retired    Social History Main Topics  . Smoking status: Former Smoker -- 2 years    Types: Pipe    Quit date: 11/07/1965  . Smokeless tobacco: Never Used     Comment: quit over 40 years ago, never smoked cigarettes  . Alcohol Use: No  . Drug Use: No  . Sexual Activity: Not on file   Other Topics Concern  . Not on file   Social History Narrative  . No narrative on file    Current Outpatient Prescriptions on File Prior to Visit  Medication Sig Dispense Refill  . amiodarone (PACERONE) 200 MG tablet Take 1 tablet (200 mg total) by mouth daily.  90 tablet  3  . cephALEXin (KEFLEX) 500 MG capsule Take 1 capsule (500 mg total) by mouth 4 (four) times daily.  28 capsule  0  . clopidogrel (PLAVIX) 75 MG tablet Take 75 mg by mouth daily with breakfast.      . ferrous sulfate 325 (65 FE) MG tablet Take 325 mg by mouth daily with breakfast.      . HYDROcodone-acetaminophen (NORCO/VICODIN) 5-325 MG per tablet Take 1 tablet by mouth every 6 (six) hours as needed for moderate pain.  12 tablet  0  . methimazole (TAPAZOLE) 5 MG tablet Take 5 mg by mouth every Monday, Wednesday, and Friday.       . metoprolol succinate (TOPROL-XL) 50 MG 24 hr tablet Take 1 tablet (50 mg total) by mouth  daily. Take with or immediately following a meal.  30 tablet  5  . Multiple Vitamin (MULTIVITAMIN WITH MINERALS) TABS Take 1 tablet by mouth daily. Centrum Silver      . pantoprazole (PROTONIX) 40 MG tablet Take 40 mg by mouth 2 (two) times daily.      . simvastatin (ZOCOR) 40 MG tablet Take 1 tablet (40 mg total) by mouth at bedtime.  90 tablet  0  . Tamsulosin HCl (FLOMAX) 0.4 MG CAPS Take 0.4 mg by mouth every evening.        No current facility-administered medications on file prior to visit.    No Known Allergies  Family History  Problem  Relation Age of Onset  . Cancer Mother     Breast Cancer    BP 128/82  Pulse 70  Temp(Src) 97.9 F (36.6 C) (Oral)  Ht 5\' 10"  (1.778 m)  Wt 180 lb (81.647 kg)  BMI 25.83 kg/m2  SpO2 95%  Review of Systems Denies further falls.    Objective:   Physical Exam VITAL SIGNS:  See vs page.   GENERAL: no distress.   Legs: bilat hyperpigmentation, varicosities, and eczematous skin.   Right leg: laceration/skin avulsion as described in ER, unchanged from last week.  Sutures are present: minimal bleeding.   Left leg: several abrasions, the largest of which is 2x2 cm.  no bleeding.         Assessment & Plan:  Laceration and abrasions, slightly better.  Not yet ready for suture removal.    Patient is advised the following: Patient Instructions  Once a day, take the bandages off, apply antibiotic ointment, and replace a large bandage. Please come back for a follow-up appointment in 2 days.   You will get better much faster if you elevate your feet above the rest of your body.

## 2014-08-25 NOTE — Patient Instructions (Signed)
Once a day, take the bandages off, apply antibiotic ointment, and replace a large bandage. Please come back for a follow-up appointment in 2 days.   You will get better much faster if you elevate your feet above the rest of your body.

## 2014-08-26 NOTE — ED Provider Notes (Signed)
I saw and evaluated the patient, reviewed the resident's note and I agree with the findings and plan.   EKG Interpretation None      Pt seen and examined.  reports wound bleeding at home. Exam with sutured wound.  New dressing applied and ace applied. No additional bleeding noted. Stable for DC.  Agree with resident evaluation.  Tanna Furry, MD 08/26/14 1435

## 2014-08-27 ENCOUNTER — Ambulatory Visit (INDEPENDENT_AMBULATORY_CARE_PROVIDER_SITE_OTHER): Payer: Medicare Other | Admitting: Endocrinology

## 2014-08-27 ENCOUNTER — Encounter: Payer: Self-pay | Admitting: Endocrinology

## 2014-08-27 VITALS — BP 122/70 | HR 70 | Temp 97.9°F | Wt 178.0 lb

## 2014-08-27 DIAGNOSIS — S81811D Laceration without foreign body, right lower leg, subsequent encounter: Secondary | ICD-10-CM

## 2014-08-27 NOTE — Patient Instructions (Signed)
Once a day, take the bandages off, apply antibiotic ointment, and replace a large bandage. Please come back for a follow-up appointment in 2 days.   You will get better much faster if you elevate your feet above the rest of your body.

## 2014-08-27 NOTE — Progress Notes (Signed)
Subjective:    Patient ID: [name] [name], male    DOB: 10-16-1929, 80 y.o.   MRN: [id number]  HPI Last week, pt was seed in ER for a laceration/skin avulsion right leg.  He also has an abrasion on the left leg.  He says he continues to feel better, with decreased pain. Past Medical History  Diagnosis Date  . GASTRIC POLYP 05/13/2009  . COLONIC POLYPS, ADENOMATOUS 01/14/2008  . HYPERLIPIDEMIA 06/01/2007  . ANEMIA, IRON DEFICIENCY 10/13/2008  . COAGULOPATHY, COUMADIN-INDUCED 01/14/2008  . ANXIETY 09/04/2008  . HYPERTENSION 06/01/2007  . ATRIAL FIBRILLATION, CHRONIC 01/14/2008    on warfarin since 2007  . UNSPECIFIED VENOUS INSUFFICIENCY 09/18/2008  . PLEURAL EFFUSION, LEFT 10/13/2008  . GERD 06/01/2007  . BARRETTS ESOPHAGUS 03/20/2009  . PROSTATE CANCER, HX OF 01/14/2008  . CATARACTS, BILATERAL, HX OF 01/14/2008  . CEREBROVASCULAR ACCIDENT, HX OF 08/18/2008  . PERSONAL HX COLONIC POLYPS 01/01/2010  . BASAL CELL CARCINOMA OF SKIN SITE UNSPECIFIED 09/28/2010  . Hyperglycemia   . Elevated PSA   . Restless leg syndrome   . Mitral regurgitation 06/13/2008    mitral valve replacement #20 St. Jude Biocor; Maze procedure by Dr. Amador Cunas  at [facility]  . CORONARY ARTERY DISEASE 06/01/2007    Cath 04/03/2008; Cath 11/01/1990  . Tachy-brady syndrome 07/13/2008    medtronic Adapta gen. model ADDR01 ,serial # [id number] H by Dr  [name] at [facility]  . Peripheral artery disease 06/16/2010    LEA doppler-left ABI nml 0.61 calcified vessels;right ABI  0.68  . Edema, peripheral 07/30/2009    LEV doppler- no thrombus or thrombophlebitis  . Aortic valvular disorder 08/16/2012    echo EF 45-50% LV mildly reduce,Biocor mitral valve,mild to mod. tricuspid regrug.,mild to mod. aortic regrug.  . Mitral valve disorder 09/08/2011    echo EF 50-55% LV normal  . Aortic valvular stenosis 11/19/2013  . Cardiomyopathy, ischemic 11/19/2013    EF 45-50% with inferior wall hypokinesis by echo 2014  . S/P CABG x 1 06/13/2008   SVG to LAD with open vein harvest right thigh, LIMA harvested but not utilized by Dr Amador Cunas  . S/P mitral valve replacement with bioprosthetic valve 06/13/2008    75mm St Jude Biocor porcine bioprosthetic tissue valve by Dr Amador Cunas  . S/P Maze operation for atrial fibrillation 06/13/2008    Partial left atrial lesion set using cryothermy for bilateral pulmonary vein isolation by Dr Amador Cunas  . Prosthetic valve dysfunction     Mitral stenosis involving bioprosthetic tissue valve placed 06/13/2008  . Mitral stenosis     Porcine bioprosthetic tissue valve placed 06/13/2008  . Dysrhythmia     HX OF TACHY BRADY  . Pacemaker   . HYPERTHYROIDISM 01/06/2011  . 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 Prostatectomy    . Tonsillectomy    . Cystectomy      Left leg  . Pacemaker placement  07/13/2008    Adapata dual chamber-Medtronic at [facility]   . Coronary artery bypass graft  06/13/2008    graft x1 w/vein graft LAD artery by Dr. Amador Cunas at [facility]  . Mitral valve replacement  06/13/2008    #20 St. Jude Bicor mitral valve ;Maze procedure at [facility] by Dr. Amador Cunas  . Cardiac catheterization  11/01/1990  . Skin cancer excision  removed from forehead and right leg, nose/face  . Upper gastrointestinal endoscopy  07/14/2009    erosive reflux esopagitis, Barrett's esophagus  . Colonoscopy  07/14/2009    diverticulosis, internal hemorrhoids  . Cardiovascular stress test  06/03/2010    Persantine perfusion EF 45% mild to moderate ischemia basal and mid inferolateral region  . Cardiovascular stress test  01/01/2008    left ventricle normal,no significant ischemia  . Cardiovascular stress test  09/09/2005     possibility of ischemia inferior wall  . Tee without cardioversion N/A 01/22/2014    Procedure: TRANSESOPHAGEAL ECHOCARDIOGRAM (TEE);   Surgeon: Sanda Klein, MD;  Location: [facility] ENDOSCOPY;  Service: Cardiovascular;  Laterality: N/A;  . Coronary stent placement  03/24/2014    DES to LAD  . Transcatheter aortic valve replacement, transfemoral N/A 04/22/2014    Procedure: TRANSCATHETER AORTIC VALVE REPLACEMENT, TRANSFEMORAL;  Surgeon: [name], MD;  Location: [place];  Service: Open Heart Surgery;  Laterality: N/A;  TAVR-TF (RIGHT SIDE)  . Intraoperative transesophageal echocardiogram N/A 04/22/2014    Procedure: INTRAOPERATIVE TRANSTHORACIC ECHOCARDIOGRAM;  Surgeon: [name], MD;  Location[place];  Service: Open Heart Surgery;  Laterality: N/A;    History   Social History  . Marital Status: Married    Spouse Name: N/A    Number of Children: 4  . Years of Education: N/A   Occupational History  . Retired    Social History Main Topics  . Smoking status: Former Smoker -- 2 years    Types: Pipe    Quit date: 11/07/1965  . Smokeless tobacco: Never Used     Comment: quit over 40 years ago, never smoked cigarettes  . Alcohol Use: No  . Drug Use: No  . Sexual Activity: Not on file   Other Topics Concern  . Not on file   Social History Narrative  . No narrative on file    Current Outpatient Prescriptions on File Prior to Visit  Medication Sig Dispense Refill  . amiodarone (PACERONE) 200 MG tablet Take 1 tablet (200 mg total) by mouth daily.  90 tablet  3  . clopidogrel (PLAVIX) 75 MG tablet Take 75 mg by mouth daily with breakfast.      . ferrous sulfate 325 (65 FE) MG tablet Take 325 mg by mouth daily with breakfast.      . HYDROcodone-acetaminophen (NORCO/VICODIN) 5-325 MG per tablet Take 1 tablet by mouth every 6 (six) hours as needed for moderate pain.  12 tablet  0  . methimazole (TAPAZOLE) 5 MG tablet Take 5 mg by mouth every Monday, Wednesday, and Friday.       . metoprolol succinate (TOPROL-XL) 50 MG 24 hr tablet Take 1 tablet (50 mg total) by mouth daily. Take with or immediately following a meal.  30  tablet  5  . Multiple Vitamin (MULTIVITAMIN WITH MINERALS) TABS Take 1 tablet by mouth daily. Centrum Silver      . pantoprazole (PROTONIX) 40 MG tablet Take 40 mg by mouth 2 (two) times daily.      . simvastatin (ZOCOR) 40 MG tablet Take 1 tablet (40 mg total) by mouth at bedtime.  90 tablet  0  . Tamsulosin HCl (FLOMAX) 0.4 MG CAPS Take 0.4 mg by mouth every evening.        No current facility-administered medications on file prior to visit.    No Known Allergies  Family History  Problem Relation Age of Onset  . Cancer Mother     Breast Cancer  BP 122/70  Pulse 70  Temp(Src) 97.9 F (36.6 C) (Oral)  Wt 178 lb (80.74 kg)  SpO2 97%   Review of Systems Denies fever.    Objective:   Physical Exam VITAL SIGNS:  See vs page GENERAL: no distress Legs: bilat hyperpigmentation, varicosities, and eczematous skin.   Right leg: laceration/skin avulsion as described in ER, unchanged from last week.  Sutures are present: minimal bleeding.   Left leg: several abrasions, the largest of which is 2x2 cm.  no bleeding.    Procedure: 9 sutures are removed.      Assessment & Plan:  Laceration, improved.  Patient is advised the following: Patient Instructions  Once a day, take the bandages off, apply antibiotic ointment, and replace a large bandage. Please come back for a follow-up appointment in 2 days.   You will get better much faster if you elevate your feet above the rest of your body.

## 2014-09-03 ENCOUNTER — Encounter: Payer: Self-pay | Admitting: Endocrinology

## 2014-09-03 ENCOUNTER — Ambulatory Visit (INDEPENDENT_AMBULATORY_CARE_PROVIDER_SITE_OTHER): Payer: Medicare Other | Admitting: Endocrinology

## 2014-09-03 VITALS — BP 136/84 | HR 69 | Temp 97.9°F | Ht 70.0 in | Wt 180.0 lb

## 2014-09-03 DIAGNOSIS — S81811D Laceration without foreign body, right lower leg, subsequent encounter: Secondary | ICD-10-CM

## 2014-09-03 NOTE — Progress Notes (Signed)
Subjective:    Patient ID: [name] [name], male    DOB: 1929-09-08, 80 y.o.   MRN: [id number]  HPI Pt returns for f/u of laceration/skin avulsion right leg. He also has an abrasion on the left leg. He says he continues to feel better, with decreased pain. Past Medical History  Diagnosis Date  . GASTRIC POLYP 05/13/2009  . COLONIC POLYPS, ADENOMATOUS 01/14/2008  . HYPERLIPIDEMIA 06/01/2007  . ANEMIA, IRON DEFICIENCY 10/13/2008  . COAGULOPATHY, COUMADIN-INDUCED 01/14/2008  . ANXIETY 09/04/2008  . HYPERTENSION 06/01/2007  . ATRIAL FIBRILLATION, CHRONIC 01/14/2008    on warfarin since 2007  . UNSPECIFIED VENOUS INSUFFICIENCY 09/18/2008  . PLEURAL EFFUSION, LEFT 10/13/2008  . GERD 06/01/2007  . BARRETTS ESOPHAGUS 03/20/2009  . PROSTATE CANCER, HX OF 01/14/2008  . CATARACTS, BILATERAL, HX OF 01/14/2008  . CEREBROVASCULAR ACCIDENT, HX OF 08/18/2008  . PERSONAL HX COLONIC POLYPS 01/01/2010  . BASAL CELL CARCINOMA OF SKIN SITE UNSPECIFIED 09/28/2010  . Hyperglycemia   . Elevated PSA   . Restless leg syndrome   . Mitral regurgitation 06/13/2008    mitral valve replacement #20 St. Jude Biocor; Maze procedure by Dr. Amador Cunas  at [facility]  . CORONARY ARTERY DISEASE 06/01/2007    Cath 04/03/2008; Cath 11/01/1990  . Tachy-brady syndrome 07/13/2008    medtronic Adapta gen. model ADDR01 ,serial # [id number] H by Dr  [name] at [facility]  . Peripheral artery disease 06/16/2010    LEA doppler-left ABI nml 0.61 calcified vessels;right ABI  0.68  . Edema, peripheral 07/30/2009    LEV doppler- no thrombus or thrombophlebitis  . Aortic valvular disorder 08/16/2012    echo EF 45-50% LV mildly reduce,Biocor mitral valve,mild to mod. tricuspid regrug.,mild to mod. aortic regrug.  . Mitral valve disorder 09/08/2011    echo EF 50-55% LV normal  . Aortic valvular stenosis 11/19/2013  . Cardiomyopathy, ischemic 11/19/2013    EF 45-50% with inferior wall hypokinesis by echo 2014  . S/P CABG x 1 06/13/2008    SVG to LAD with  open vein harvest right thigh, LIMA harvested but not utilized by Dr Amador Cunas  . S/P mitral valve replacement with bioprosthetic valve 06/13/2008    65mm St Jude Biocor porcine bioprosthetic tissue valve by Dr Amador Cunas  . S/P Maze operation for atrial fibrillation 06/13/2008    Partial left atrial lesion set using cryothermy for bilateral pulmonary vein isolation by Dr Amador Cunas  . Prosthetic valve dysfunction     Mitral stenosis involving bioprosthetic tissue valve placed 06/13/2008  . Mitral stenosis     Porcine bioprosthetic tissue valve placed 06/13/2008  . Dysrhythmia     HX OF TACHY BRADY  . Pacemaker   . HYPERTHYROIDISM 01/06/2011  . 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 Prostatectomy    . Tonsillectomy    . Cystectomy      Left leg  . Pacemaker placement  07/13/2008    Adapata dual chamber-Medtronic at [facility]   . Coronary artery bypass graft  06/13/2008    graft x1 w/vein graft LAD artery by Dr. Amador Cunas at [facility]  . Mitral valve replacement  06/13/2008    #20 St. Jude Bicor mitral valve ;Maze procedure at [facility] by Dr. Amador Cunas  . Cardiac catheterization  11/01/1990  . Skin cancer excision      removed from forehead and  right leg, nose/face  . Upper gastrointestinal endoscopy  07/14/2009    erosive reflux esopagitis, Barrett's esophagus  . Colonoscopy  07/14/2009    diverticulosis, internal hemorrhoids  . Cardiovascular stress test  06/03/2010    Persantine perfusion EF 45% mild to moderate ischemia basal and mid inferolateral region  . Cardiovascular stress test  01/01/2008    left ventricle normal,no significant ischemia  . Cardiovascular stress test  09/09/2005     possibility of ischemia inferior wall  . Tee without cardioversion N/A 01/22/2014    Procedure: TRANSESOPHAGEAL ECHOCARDIOGRAM (TEE);  Surgeon: Sanda Klein, MD;  Location: [facility] ENDOSCOPY;  Service: Cardiovascular;  Laterality: N/A;  . Coronary stent placement  03/24/2014    DES to LAD  . Transcatheter aortic valve replacement, transfemoral N/A 04/22/2014    Procedure: TRANSCATHETER AORTIC VALVE REPLACEMENT, TRANSFEMORAL;  Surgeon: [name], MD;  Location: [place];  Service: Open Heart Surgery;  Laterality: N/A;  TAVR-TF (RIGHT SIDE)  . Intraoperative transesophageal echocardiogram N/A 04/22/2014    Procedure: INTRAOPERATIVE TRANSTHORACIC ECHOCARDIOGRAM;  Surgeon: [name], MD;  Location: [place];  Service: Open Heart Surgery;  Laterality: N/A;    History   Social History  . Marital Status: Married    Spouse Name: N/A    Number of Children: 4  . Years of Education: N/A   Occupational History  . Retired    Social History Main Topics  . Smoking status: Former Smoker -- 2 years    Types: Pipe    Quit date: 11/07/1965  . Smokeless tobacco: Never Used     Comment: quit over 40 years ago, never smoked cigarettes  . Alcohol Use: No  . Drug Use: No  . Sexual Activity: Not on file   Other Topics Concern  . Not on file   Social History Narrative  . No narrative on file    Current Outpatient Prescriptions on File Prior to Visit  Medication Sig Dispense Refill  . amiodarone (PACERONE) 200 MG tablet Take 1 tablet (200 mg total) by mouth daily.  90 tablet  3  . clopidogrel (PLAVIX) 75 MG tablet Take 75 mg by mouth daily with breakfast.      . ferrous sulfate 325 (65 FE) MG tablet Take 325 mg by mouth daily with breakfast.      . HYDROcodone-acetaminophen (NORCO/VICODIN) 5-325 MG per tablet Take 1 tablet by mouth every 6 (six) hours as needed for moderate pain.  12 tablet  0  . methimazole (TAPAZOLE) 5 MG tablet Take 5 mg by mouth every Monday, Wednesday, and Friday.       . metoprolol succinate (TOPROL-XL) 50 MG 24 hr tablet Take 1 tablet (50 mg total) by mouth daily. Take with or immediately following a meal.  30 tablet  5  .  Multiple Vitamin (MULTIVITAMIN WITH MINERALS) TABS Take 1 tablet by mouth daily. Centrum Silver      . pantoprazole (PROTONIX) 40 MG tablet Take 40 mg by mouth 2 (two) times daily.      . simvastatin (ZOCOR) 40 MG tablet Take 1 tablet (40 mg total) by mouth at bedtime.  90 tablet  0  . Tamsulosin HCl (FLOMAX) 0.4 MG CAPS Take 0.4 mg by mouth every evening.        No current facility-administered medications on file prior to visit.    No Known Allergies  Family History  Problem Relation Age of Onset  . Cancer Mother     Breast Cancer    BP 136/84  Pulse 69  Temp(Src) 97.9 F (36.6 C) (Oral)  Ht 5\' 10"  (1.778 m)  Wt 180 lb (81.647 kg)  BMI 25.83 kg/m2  SpO2 94%    Review of Systems Denies fever    Objective:   Physical Exam VITAL SIGNS:  See vs page GENERAL: no distress Legs: bilat hyperpigmentation, varicosities, and eczematous skin.  1+ bilat leg edema Right leg: laceration/skin avulsion is much better, but there is still open ulceration.  No bleeding.   Left leg: several abrasions, the largest of which is 2x2 cm. no bleeding.  No open ulcer.         Assessment & Plan:  Laceration, with slow improvement.  He declines ref to wound care for now   Patient is advised the following: Patient Instructions  Once a day, take the bandages off, apply antibiotic ointment, and replace a large bandage.   Please call if this doesn't continue to improve.    You will get better much faster if you elevate your feet above the rest of your body.

## 2014-09-03 NOTE — Patient Instructions (Signed)
Once a day, take the bandages off, apply antibiotic ointment, and replace a large bandage.   Please call if this doesn't continue to improve.    You will get better much faster if you elevate your feet above the rest of your body.

## 2014-09-10 ENCOUNTER — Ambulatory Visit (INDEPENDENT_AMBULATORY_CARE_PROVIDER_SITE_OTHER): Payer: Medicare Other | Admitting: Cardiovascular Disease

## 2014-09-10 ENCOUNTER — Encounter: Payer: Self-pay | Admitting: Cardiovascular Disease

## 2014-09-10 VITALS — BP 122/72 | HR 70 | Resp 16 | Ht 70.0 in | Wt 181.6 lb

## 2014-09-10 DIAGNOSIS — Z953 Presence of xenogenic heart valve: Secondary | ICD-10-CM

## 2014-09-10 DIAGNOSIS — T8209XA Other mechanical complication of heart valve prosthesis, initial encounter: Secondary | ICD-10-CM

## 2014-09-10 DIAGNOSIS — Z9889 Other specified postprocedural states: Secondary | ICD-10-CM

## 2014-09-10 DIAGNOSIS — Z954 Presence of other heart-valve replacement: Secondary | ICD-10-CM

## 2014-09-10 DIAGNOSIS — I5033 Acute on chronic diastolic (congestive) heart failure: Secondary | ICD-10-CM

## 2014-09-10 DIAGNOSIS — Z951 Presence of aortocoronary bypass graft: Secondary | ICD-10-CM

## 2014-09-10 DIAGNOSIS — Z950 Presence of cardiac pacemaker: Secondary | ICD-10-CM

## 2014-09-10 DIAGNOSIS — I25810 Atherosclerosis of coronary artery bypass graft(s) without angina pectoris: Secondary | ICD-10-CM

## 2014-09-10 DIAGNOSIS — I050 Rheumatic mitral stenosis: Secondary | ICD-10-CM

## 2014-09-10 DIAGNOSIS — T8201XA Breakdown (mechanical) of heart valve prosthesis, initial encounter: Secondary | ICD-10-CM

## 2014-09-10 DIAGNOSIS — I495 Sick sinus syndrome: Secondary | ICD-10-CM

## 2014-09-10 DIAGNOSIS — Z8679 Personal history of other diseases of the circulatory system: Secondary | ICD-10-CM

## 2014-09-10 DIAGNOSIS — I255 Ischemic cardiomyopathy: Secondary | ICD-10-CM

## 2014-09-10 DIAGNOSIS — I10 Essential (primary) hypertension: Secondary | ICD-10-CM

## 2014-09-10 DIAGNOSIS — I97110 Postprocedural cardiac insufficiency following cardiac surgery: Secondary | ICD-10-CM

## 2014-09-10 DIAGNOSIS — I350 Nonrheumatic aortic (valve) stenosis: Secondary | ICD-10-CM

## 2014-09-10 DIAGNOSIS — I4891 Unspecified atrial fibrillation: Secondary | ICD-10-CM

## 2014-09-10 DIAGNOSIS — Z952 Presence of prosthetic heart valve: Secondary | ICD-10-CM

## 2014-09-10 LAB — COMPREHENSIVE METABOLIC PANEL
ALT: 10 U/L (ref 0–53)
AST: 17 U/L (ref 0–37)
Albumin: 3.9 g/dL (ref 3.5–5.2)
Alkaline Phosphatase: 89 U/L (ref 39–117)
BUN: 22 mg/dL (ref 6–23)
CALCIUM: 8.6 mg/dL (ref 8.4–10.5)
CO2: 31 meq/L (ref 19–32)
Chloride: 111 mEq/L (ref 96–112)
Creat: 0.91 mg/dL (ref 0.50–1.35)
GLUCOSE: 90 mg/dL (ref 70–99)
Potassium: 4.5 mEq/L (ref 3.5–5.3)
SODIUM: 146 meq/L — AB (ref 135–145)
TOTAL PROTEIN: 6.3 g/dL (ref 6.0–8.3)
Total Bilirubin: 0.5 mg/dL (ref 0.2–1.2)

## 2014-09-10 MED ORDER — FUROSEMIDE 40 MG PO TABS: 40.0000 mg | ORAL_TABLET | Freq: Every day | ORAL | Status: DC

## 2014-09-10 NOTE — Patient Instructions (Signed)
Your physician recommends that you return for lab work when convenient.  Your physician recommends that you weigh, daily, at the same time every day, and in the same amount of clothing. Please record your daily weights on the handout provided and bring it to your next appointment.  Your physician has recommended you make the following change in your medication: TAKE FUROSEMIDE (LASIX) 40MG  DAILY UNTIL YOUR WEIGHT IS UNDER 173 LBS. AFTER THAT YOU MAY USE IT ONLY WHEN YOUR WEIGHT EXCEEDS 173 LBS.  Your physician recommends that you schedule a follow-up appointment in:  3 months with Dr.[name]
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
## 2014-09-12 LAB — MDC_IDC_ENUM_SESS_TYPE_INCLINIC
Battery Impedance: 1677 Ohm
Battery Voltage: 2.76 V
Brady Statistic AP VS Percent: 0 %
Date Time Interrogation Session: 20151104193747
Lead Channel Pacing Threshold Amplitude: 0.75 V
Lead Channel Pacing Threshold Pulse Width: 0.4 ms
Lead Channel Sensing Intrinsic Amplitude: 1.4 mV
Lead Channel Sensing Intrinsic Amplitude: 5.6 mV
Lead Channel Setting Pacing Amplitude: 2 V
Lead Channel Setting Pacing Pulse Width: 0.4 ms
Lead Channel Setting Sensing Sensitivity: 2 mV
MDC IDC MSMT BATTERY REMAINING LONGEVITY: 36 mo
MDC IDC MSMT LEADCHNL RA IMPEDANCE VALUE: 388 Ohm
MDC IDC MSMT LEADCHNL RV IMPEDANCE VALUE: 601 Ohm
MDC IDC SET LEADCHNL RV PACING AMPLITUDE: 2.5 V
MDC IDC STAT BRADY AP VP PERCENT: 22 %
MDC IDC STAT BRADY AS VP PERCENT: 77 %
MDC IDC STAT BRADY AS VS PERCENT: 0 %

## 2014-09-14 ENCOUNTER — Other Ambulatory Visit: Payer: Self-pay | Admitting: Cardiology

## 2014-09-15 ENCOUNTER — Telehealth: Payer: Self-pay | Admitting: Endocrinology

## 2014-09-15 MED ORDER — METHIMAZOLE 5 MG PO TABS: 5.0000 mg | ORAL_TABLET | ORAL | Status: DC

## 2014-09-15 MED ORDER — SIMVASTATIN 40 MG PO TABS: 40.0000 mg | ORAL_TABLET | Freq: Every day | ORAL | Status: DC

## 2014-09-15 NOTE — Telephone Encounter (Signed)
E sent to pharmacy 

## 2014-09-15 NOTE — Telephone Encounter (Signed)
Please call in the meds to [facility], methamazole and symvastatin

## 2014-09-15 NOTE — Telephone Encounter (Signed)
Rx sent to pharmacy   

## 2014-10-08 ENCOUNTER — Encounter: Payer: Self-pay | Admitting: Cardiovascular Disease

## 2014-10-16 ENCOUNTER — Encounter (HOSPITAL_COMMUNITY): Payer: Self-pay | Admitting: Cardiovascular Disease

## 2014-10-24 ENCOUNTER — Other Ambulatory Visit: Payer: Self-pay

## 2014-10-24 MED ORDER — SIMVASTATIN 40 MG PO TABS: 40.0000 mg | ORAL_TABLET | Freq: Every day | ORAL | Status: DC

## 2014-10-24 MED ORDER — METHIMAZOLE 5 MG PO TABS: 5.0000 mg | ORAL_TABLET | ORAL | Status: DC

## 2014-12-10 ENCOUNTER — Encounter: Payer: Self-pay | Admitting: Cardiovascular Disease

## 2014-12-10 ENCOUNTER — Ambulatory Visit (INDEPENDENT_AMBULATORY_CARE_PROVIDER_SITE_OTHER): Payer: Medicare Other | Admitting: Cardiovascular Disease

## 2014-12-10 VITALS — BP 134/80 | HR 69 | Ht 70.0 in | Wt 171.5 lb

## 2014-12-10 DIAGNOSIS — Z9861 Coronary angioplasty status: Secondary | ICD-10-CM

## 2014-12-10 DIAGNOSIS — Z79899 Other long term (current) drug therapy: Secondary | ICD-10-CM

## 2014-12-10 DIAGNOSIS — Z951 Presence of aortocoronary bypass graft: Secondary | ICD-10-CM

## 2014-12-10 DIAGNOSIS — Z954 Presence of other heart-valve replacement: Secondary | ICD-10-CM

## 2014-12-10 DIAGNOSIS — I4891 Unspecified atrial fibrillation: Secondary | ICD-10-CM

## 2014-12-10 DIAGNOSIS — T8201XA Breakdown (mechanical) of heart valve prosthesis, initial encounter: Secondary | ICD-10-CM

## 2014-12-10 DIAGNOSIS — Z952 Presence of prosthetic heart valve: Secondary | ICD-10-CM

## 2014-12-10 DIAGNOSIS — I2510 Atherosclerotic heart disease of native coronary artery without angina pectoris: Secondary | ICD-10-CM

## 2014-12-10 DIAGNOSIS — I350 Nonrheumatic aortic (valve) stenosis: Secondary | ICD-10-CM

## 2014-12-10 DIAGNOSIS — I97110 Postprocedural cardiac insufficiency following cardiac surgery: Secondary | ICD-10-CM

## 2014-12-10 DIAGNOSIS — Z950 Presence of cardiac pacemaker: Secondary | ICD-10-CM

## 2014-12-10 DIAGNOSIS — I255 Ischemic cardiomyopathy: Secondary | ICD-10-CM

## 2014-12-10 DIAGNOSIS — E038 Other specified hypothyroidism: Secondary | ICD-10-CM

## 2014-12-10 DIAGNOSIS — E785 Hyperlipidemia, unspecified: Secondary | ICD-10-CM

## 2014-12-10 DIAGNOSIS — T8209XA Other mechanical complication of heart valve prosthesis, initial encounter: Secondary | ICD-10-CM

## 2014-12-10 DIAGNOSIS — I5032 Chronic diastolic (congestive) heart failure: Secondary | ICD-10-CM

## 2014-12-10 DIAGNOSIS — I495 Sick sinus syndrome: Secondary | ICD-10-CM

## 2014-12-10 DIAGNOSIS — Z7901 Long term (current) use of anticoagulants: Secondary | ICD-10-CM

## 2014-12-10 MED ORDER — CLOPIDOGREL BISULFATE 75 MG PO TABS: 75.0000 mg | ORAL_TABLET | Freq: Every day | ORAL | Status: DC
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
## 2014-12-10 NOTE — Patient Instructions (Signed)
DECREASE Amiodarone to 100mg  daily.  Your physician has requested that you have an echocardiogram. Echocardiography is a painless test that uses sound waves to create images of your heart. It provides your doctor with information about the size and shape of your heart and how well your heart's chambers and valves are working. This procedure takes approximately one hour. There are no restrictions for this procedure.  Your physician recommends that you return for lab work in: Fasting at [facility].  Remote monitoring is used to monitor your Pacemaker from home. This monitoring reduces the number of office visits required to check your device to one time per year. It allows [place] to monitor the functioning of your device to ensure it is working properly. You are scheduled for a device check from home on Mar 11, 2015. You may send your transmission at any time that day. If you have a wireless device, the transmission will be sent automatically. After your physician reviews your transmission, you will receive a postcard with your next transmission date.  Dr. [name] recommends that you schedule a follow-up appointment in: June with pacemaker check Medtronic Pearline Cables).

## 2014-12-18 LAB — MDC_IDC_ENUM_SESS_TYPE_REMOTE
Battery Impedance: 1795 Ohm
Battery Remaining Longevity: 34 mo
Battery Voltage: 2.76 V
Brady Statistic AP VP Percent: 23 %
Brady Statistic AP VS Percent: 0 %
Brady Statistic AS VP Percent: 77 %
Lead Channel Impedance Value: 715 Ohm
Lead Channel Pacing Threshold Amplitude: 0.5 V
Lead Channel Setting Sensing Sensitivity: 4 mV
MDC IDC MSMT LEADCHNL RA IMPEDANCE VALUE: 411 Ohm
MDC IDC MSMT LEADCHNL RV PACING THRESHOLD PULSEWIDTH: 0.4 ms
MDC IDC SESS DTM: 20160203175433
MDC IDC SET LEADCHNL RA PACING AMPLITUDE: 2 V
MDC IDC SET LEADCHNL RV PACING AMPLITUDE: 2.5 V
MDC IDC SET LEADCHNL RV PACING PULSEWIDTH: 0.4 ms
MDC IDC STAT BRADY AS VS PERCENT: 0 %

## 2014-12-23 ENCOUNTER — Ambulatory Visit (INDEPENDENT_AMBULATORY_CARE_PROVIDER_SITE_OTHER): Payer: Medicare Other | Admitting: Endocrinology

## 2014-12-23 ENCOUNTER — Encounter: Payer: Self-pay | Admitting: Endocrinology

## 2014-12-23 VITALS — BP 130/78 | HR 70 | Temp 97.7°F | Ht 70.0 in | Wt 172.0 lb

## 2014-12-23 DIAGNOSIS — R490 Dysphonia: Secondary | ICD-10-CM | POA: Diagnosis not present

## 2014-12-23 DIAGNOSIS — Z0000 Encounter for general adult medical examination without abnormal findings: Secondary | ICD-10-CM | POA: Diagnosis not present

## 2014-12-23 MED ORDER — AZITHROMYCIN 250 MG PO TABS: 250.0000 mg | ORAL_TABLET | Freq: Every day | ORAL | Status: DC

## 2014-12-23 NOTE — Patient Instructions (Addendum)
i have sent a prescription to your pharmacy, for an antibiotic pill. Please come back for a regular physical appointment in 1 month, when we'll recheck the anemia test. good diet and exercise habits significanly improve the control of your health.  please let me know if you wish to be referred to a dietician.  high blood sugar is very risky to your health.  you should see an eye doctor and dentist every year.   please consider these measures for your health:  minimize alcohol.  do not use tobacco products.  have a colonoscopy at least every 10 years from age 33.  keep firearms safely stored.  always use seat belts.  have working smoke alarms in your home.  see an eye doctor and dentist regularly.  never drive under the influence of alcohol or drugs (including prescription drugs).  those with fair skin should take precautions against the sun. it is critically important to prevent falling down (keep floor areas well-lit, dry, and free of loose objects.  If you have a cane, walker, or wheelchair, you should use it, even for short trips around the house.  Also, try not to rush).

## 2014-12-23 NOTE — Progress Notes (Signed)
we discussed code status.  pt requests DNR 

## 2014-12-23 NOTE — Progress Notes (Signed)
Subjective:    Patient ID: [name] [name], male    DOB: Jul 12, 1929, 81 y.o.   MRN: [id number]  HPI Pt states 1 week of slight prod-quality cough in the chest, but no assoc sore throat.   Pt says he takes fe 1/day.   Past Medical History  Diagnosis Date  . GASTRIC POLYP 05/13/2009  . COLONIC POLYPS, ADENOMATOUS 01/14/2008  . HYPERLIPIDEMIA 06/01/2007  . ANEMIA, IRON DEFICIENCY 10/13/2008  . COAGULOPATHY, COUMADIN-INDUCED 01/14/2008  . ANXIETY 09/04/2008  . HYPERTENSION 06/01/2007  . ATRIAL FIBRILLATION, CHRONIC 01/14/2008    on warfarin since 2007  . UNSPECIFIED VENOUS INSUFFICIENCY 09/18/2008  . PLEURAL EFFUSION, LEFT 10/13/2008  . GERD 06/01/2007  . BARRETTS ESOPHAGUS 03/20/2009  . PROSTATE CANCER, HX OF 01/14/2008  . CATARACTS, BILATERAL, HX OF 01/14/2008  . CEREBROVASCULAR ACCIDENT, HX OF 08/18/2008  . PERSONAL HX COLONIC POLYPS 01/01/2010  . BASAL CELL CARCINOMA OF SKIN SITE UNSPECIFIED 09/28/2010  . Hyperglycemia   . Elevated PSA   . Restless leg syndrome   . Mitral regurgitation 06/13/2008    mitral valve replacement #20 St. Jude Biocor; Maze procedure by Dr. Amador Cunas  at [facility]  . CORONARY ARTERY DISEASE 06/01/2007    Cath 04/03/2008; Cath 11/01/1990  . Tachy-brady syndrome 07/13/2008    medtronic Adapta gen. model ADDR01 ,serial # [id number] H by Dr  [name] at [facility]  . Peripheral artery disease 06/16/2010    LEA doppler-left ABI nml 0.61 calcified vessels;right ABI  0.68  . Edema, peripheral 07/30/2009    LEV doppler- no thrombus or thrombophlebitis  . Aortic valvular disorder 08/16/2012    echo EF 45-50% LV mildly reduce,Biocor mitral valve,mild to mod. tricuspid regrug.,mild to mod. aortic regrug.  . Mitral valve disorder 09/08/2011    echo EF 50-55% LV normal  . Aortic valvular stenosis 11/19/2013  . Cardiomyopathy, ischemic 11/19/2013    EF 45-50% with inferior wall hypokinesis by echo 2014  . S/P CABG x 1 06/13/2008    SVG to LAD with open vein harvest right thigh, LIMA  harvested but not utilized by Dr Amador Cunas  . S/P mitral valve replacement with bioprosthetic valve 06/13/2008    27mm St Jude Biocor porcine bioprosthetic tissue valve by Dr Amador Cunas  . S/P Maze operation for atrial fibrillation 06/13/2008    Partial left atrial lesion set using cryothermy for bilateral pulmonary vein isolation by Dr Amador Cunas  . Prosthetic valve dysfunction     Mitral stenosis involving bioprosthetic tissue valve placed 06/13/2008  . Mitral stenosis     Porcine bioprosthetic tissue valve placed 06/13/2008  . Dysrhythmia     HX OF TACHY BRADY  . Pacemaker   . HYPERTHYROIDISM 01/06/2011  . 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 Prostatectomy    . Tonsillectomy    . Cystectomy      Left leg  . Pacemaker placement  07/13/2008    Adapata dual chamber-Medtronic at [facility]   . Coronary artery bypass graft  06/13/2008    graft x1 w/vein graft LAD artery by Dr. Amador Cunas at [facility]  . Mitral valve replacement  06/13/2008    #20 St. Jude Bicor mitral valve ;Maze procedure at [facility] by Dr. Amador Cunas  . Cardiac catheterization  11/01/1990  . Skin cancer excision      removed from forehead and right leg,  nose/face  . Upper gastrointestinal endoscopy  07/14/2009    erosive reflux esopagitis, Barrett's esophagus  . Colonoscopy  07/14/2009    diverticulosis, internal hemorrhoids  . Cardiovascular stress test  06/03/2010    Persantine perfusion EF 45% mild to moderate ischemia basal and mid inferolateral region  . Cardiovascular stress test  01/01/2008    left ventricle normal,no significant ischemia  . Cardiovascular stress test  09/09/2005     possibility of ischemia inferior wall  . Tee without cardioversion N/A 01/22/2014    Procedure: TRANSESOPHAGEAL ECHOCARDIOGRAM (TEE);  Surgeon: Sanda Klein, MD;  Location: [facility] ENDOSCOPY;   Service: Cardiovascular;  Laterality: N/A;  . Coronary stent placement  03/24/2014    DES to LAD  . Transcatheter aortic valve replacement, transfemoral N/A 04/22/2014    Procedure: TRANSCATHETER AORTIC VALVE REPLACEMENT, TRANSFEMORAL;  Surgeon: [name], MD;  Location: [place];  Service: Open Heart Surgery;  Laterality: N/A;  TAVR-TF (RIGHT SIDE)  . Intraoperative transesophageal echocardiogram N/A 04/22/2014    Procedure: INTRAOPERATIVE TRANSTHORACIC ECHOCARDIOGRAM;  Surgeon: [name], MD;  Location: [place];  Service: Open Heart Surgery;  Laterality: N/A;  . Left and right heart catheterization with coronary angiogram N/A 01/08/2014    Procedure: LEFT AND RIGHT HEART CATHETERIZATION WITH CORONARY ANGIOGRAM;  Surgeon: Sanda Klein, MD;  Location: [place] CATH LAB;  Service: Cardiovascular;  Laterality: N/A;  . Percutaneous coronary rotoblator intervention (pci-r) N/A 03/24/2014    Procedure: PERCUTANEOUS CORONARY ROTOBLATOR INTERVENTION (PCI-R);  Surgeon: Blane Ohara, MD;  Location: [facility] CATH LAB;  Service: Cardiovascular;  Laterality: N/A;    History   Social History  . Marital Status: Married    Spouse Name: N/A  . Number of Children: 4  . Years of Education: N/A   Occupational History  . Retired    Social History Main Topics  . Smoking status: Former Smoker -- 2 years    Types: Pipe    Quit date: 11/07/1965  . Smokeless tobacco: Never Used     Comment: quit over 40 years ago, never smoked cigarettes  . Alcohol Use: No  . Drug Use: No  . Sexual Activity: Not on file   Other Topics Concern  . Not on file   Social History Narrative    Current Outpatient Prescriptions on File Prior to Visit  Medication Sig Dispense Refill  . amiodarone (PACERONE) 200 MG tablet Take 1 tablet (200 mg total) by mouth daily. (Patient taking differently: Take 150 mg by mouth daily. ) 90 tablet 3  . clopidogrel (PLAVIX) 75 MG tablet Take 1 tablet (75 mg total) by mouth daily with breakfast. 90  tablet 3  . ferrous sulfate 325 (65 FE) MG tablet Take 325 mg by mouth daily with breakfast.    . methimazole (TAPAZOLE) 5 MG tablet Take 1 tablet (5 mg total) by mouth every Monday, Wednesday, and Friday. 36 tablet 1  . metoprolol succinate (TOPROL-XL) 50 MG 24 hr tablet TAKE 1 TABLET (50 MG TOTAL) BY MOUTH DAILY. TAKE WITH OR IMMEDIATELY FOLLOWING A MEAL. 30 tablet 5  . Multiple Vitamin (MULTIVITAMIN WITH MINERALS) TABS Take 1 tablet by mouth daily. Centrum Silver    . pantoprazole (PROTONIX) 40 MG tablet Take 40 mg by mouth 2 (two) times daily.    . simvastatin (ZOCOR) 40 MG tablet Take 1 tablet (40 mg total) by mouth at bedtime. 90 tablet 1  . Tamsulosin HCl (FLOMAX) 0.4 MG CAPS Take 0.4 mg by mouth every evening.     . furosemide (  LASIX) 40 MG tablet Take 1 tablet (40 mg total) by mouth daily. (Patient not taking: Reported on 12/23/2014) 90 tablet 3   No current facility-administered medications on file prior to visit.    No Known Allergies  Family History  Problem Relation Age of Onset  . Cancer Mother     Breast Cancer    BP 130/78 mmHg  Pulse 70  Temp(Src) 97.7 F (36.5 C) (Oral)  Ht 5\' 10"  (1.778 m)  Wt 172 lb (78.019 kg)  BMI 24.68 kg/m2  SpO2 95%   Review of Systems Denies fever and sob.      Objective:   Physical Exam VITAL SIGNS:  See vs page GENERAL: no distress head: no deformity eyes: no periorbital swelling, no proptosis external nose and ears are normal mouth: no lesion seen Both eac's and tm's are normal LUNGS:  Clear to auscultation.         Assessment & Plan:  URI: new AF: dosage of azithromycin is reduced due to amiodarone.   Anemia: pt declines to recheck cbc today.  i told pt we'll recheck upon return. Continue fe tabs for now.   Subjective:   Patient here for Medicare annual wellness visit and management of other chronic and acute problems.     Risk factors: advanced age    29 of Physicians Providing Medical Care to Patient:  See  "snapshot"   Activities of Daily Living: In your present state of health, do you have any difficulty performing the following activities?:  Preparing food and eating?: No  Bathing yourself: No  Getting dressed: No  Using the toilet:No  Moving around from place to place: No  In the past year have you fallen or had a near fall?:No    Home Safety: Has smoke detector and wears seat belts. No firearms. No excess sun exposure.  Diet and Exercise  Current exercise habits: pt says good, as limited by health problems Dietary issues discussed: pt reports a healthy diet   Depression Screen  Q1: Over the past two weeks, have you felt down, depressed or hopeless? no  Q2: Over the past two weeks, have you felt little interest or pleasure in doing things? no   The following portions of the patient's history were reviewed and updated as appropriate: allergies, current medications, past family history, past medical history, past social history, past surgical history and problem list.   Review of Systems  Denies hearing loss, and visual loss Objective:   Vision:  Sees opthalmologist Hearing: grossly normal Body mass index:  See vs page Msk: pt easily and quickly performs "get-up-and-go" from a sitting position Cognitive Impairment Assessment: cognition, memory and judgment appear normal.  remembers 2/3 at 5 minutes (? Effort).  excellent recall.  can easily read and write a sentence.  alert and oriented x 3.   Assessment:   Medicare wellness utd on preventive parameters    Plan:   During the course of the visit the patient was educated and counseled about appropriate screening and preventive services including:        Fall prevention    Diabetes screening  Nutrition counseling   Vaccines / LABS Zostavax / Pneumococcal Vaccine  today  PSA  Patient Instructions (the written plan) was given to the patient.

## 2014-12-24 ENCOUNTER — Encounter: Payer: Self-pay | Admitting: Cardiovascular Disease

## 2014-12-25 ENCOUNTER — Ambulatory Visit: Payer: Medicare Other | Admitting: Endocrinology

## 2014-12-26 LAB — LIPID PANEL
CHOL/HDL RATIO: 2.9 ratio
Cholesterol: 108 mg/dL (ref 0–200)
HDL: 37 mg/dL — ABNORMAL LOW (ref >39)
LDL Cholesterol: 60 mg/dL (ref 0–99)
TRIGLYCERIDES: 55 mg/dL (ref <150)
VLDL: 11 mg/dL (ref 0–40)

## 2014-12-26 LAB — T4, FREE: Free T4: 1.38 ng/dL (ref 0.80–1.80)

## 2014-12-26 LAB — COMPREHENSIVE METABOLIC PANEL
ALK PHOS: 109 U/L (ref 39–117)
ALT: 12 U/L (ref 0–53)
AST: 13 U/L (ref 0–37)
Albumin: 3.5 g/dL (ref 3.5–5.2)
BILIRUBIN TOTAL: 0.7 mg/dL (ref 0.2–1.2)
BUN: 21 mg/dL (ref 6–23)
CO2: 28 mEq/L (ref 19–32)
CREATININE: 0.85 mg/dL (ref 0.50–1.35)
Calcium: 8.6 mg/dL (ref 8.4–10.5)
Chloride: 109 mEq/L (ref 96–112)
Glucose, Bld: 89 mg/dL (ref 70–99)
Potassium: 4.1 mEq/L (ref 3.5–5.3)
Sodium: 144 mEq/L (ref 135–145)
Total Protein: 6 g/dL (ref 6.0–8.3)

## 2014-12-26 LAB — T3, FREE: T3 FREE: 2.3 pg/mL (ref 2.3–4.2)

## 2014-12-26 LAB — TSH: TSH: 3.736 u[IU]/mL (ref 0.350–4.500)

## 2014-12-29 ENCOUNTER — Telehealth: Payer: Self-pay | Admitting: Internal Medicine

## 2014-12-29 ENCOUNTER — Other Ambulatory Visit: Payer: Self-pay | Admitting: Endocrinology

## 2014-12-29 NOTE — Telephone Encounter (Signed)
After reviewing last office note I called him and made him a follow up appointment for tomorrow with Dr Carlean Purl so no medicine refilled today in case Dr Carlean Purl wants to change dose / medicine etc. Patient was ok with this plan.

## 2014-12-30 ENCOUNTER — Ambulatory Visit (INDEPENDENT_AMBULATORY_CARE_PROVIDER_SITE_OTHER): Payer: Medicare Other | Admitting: Internal Medicine

## 2014-12-30 ENCOUNTER — Encounter: Payer: Self-pay | Admitting: Internal Medicine

## 2014-12-30 VITALS — BP 126/70 | HR 70 | Ht 70.0 in | Wt 172.4 lb

## 2014-12-30 DIAGNOSIS — R490 Dysphonia: Secondary | ICD-10-CM

## 2014-12-30 DIAGNOSIS — K2270 Barrett's esophagus without dysplasia: Secondary | ICD-10-CM

## 2014-12-30 MED ORDER — PANTOPRAZOLE SODIUM 40 MG PO TBEC
40.0000 mg | DELAYED_RELEASE_TABLET | Freq: Two times a day (BID) | ORAL | Status: DC
Start: 2014-12-30 — End: 2015-10-15

## 2014-12-30 NOTE — Patient Instructions (Signed)
   Please see me again in a year. Sooner if having any swallowing problems or heartburn problems.  I appreciate the opportunity to care for you. Gatha Mayer, MD, [facility].

## 2014-12-30 NOTE — Progress Notes (Signed)
   Subjective:    Patient ID: [name] [name], male    DOB: [birth year], 81 y.o.   MRN: [id number] Chief Complaint is follow-up of Barrett's esophagus HPI The patient is here for follow-up of his Barrett's esophagus. He denies dysphagia or heartburn symptoms. He takes a twice a day PPI and needs a refill. Since I saw him last year he had transcatheter aortic and mitral valve surgery that was successful. He has been somewhat hoarse since he had a nasoenteric tube removed in 2015. It's better but is persisted. He has been to ear nose and throat and has been no abnormality.  Medications, allergies, past medical history, past surgical history, family history and social history are reviewed and updated in the EMR.  Review of Systems As above.    Objective:   Physical Exam BP 126/70 mmHg  Pulse 70  Ht 5\' 10"  (1.778 m)  Wt 172 lb 6.4 oz (78.2 kg)  BMI 24.74 kg/m2  SpO2 98% Elderly white man in no acute distress       Assessment & Plan:   1. Barrett's esophagus   2. Hoarseness    1. He will continue his twice a day PPI. 2. We talked about the pros and cons of a screening or surveillance endoscopy and a decided not to do that at his age and with his comorbidities. He understands there is a risk of esophageal cancer. The current plan is to follow-up in one year in the clinic.

## 2015-01-18 ENCOUNTER — Telehealth: Payer: Self-pay | Admitting: Cardiology

## 2015-01-18 NOTE — Telephone Encounter (Signed)
error 

## 2015-01-18 NOTE — Telephone Encounter (Signed)
Pt called, recently had Amiodarone cut back. This am he is "dizzy". His HR is not fast. He is not on Coumadin. I think he should come in Monday for a pacemaker check to see if his symptoms are from underlying AF.  [name] PA-C 01/18/2015 11:57 AM

## 2015-01-19 ENCOUNTER — Telehealth: Payer: Self-pay | Admitting: Cardiovascular Disease

## 2015-01-19 ENCOUNTER — Ambulatory Visit (INDEPENDENT_AMBULATORY_CARE_PROVIDER_SITE_OTHER): Payer: Medicare Other | Admitting: *Deleted

## 2015-01-19 DIAGNOSIS — I495 Sick sinus syndrome: Secondary | ICD-10-CM

## 2015-01-19 DIAGNOSIS — I4891 Unspecified atrial fibrillation: Secondary | ICD-10-CM

## 2015-01-19 LAB — MDC_IDC_ENUM_SESS_TYPE_INCLINIC
Battery Impedance: 1886 Ohm
Battery Voltage: 2.76 V
Brady Statistic AP VS Percent: 0.1 % — CL
Brady Statistic AS VP Percent: 77.2 %
Brady Statistic AS VS Percent: 0.4 %
Lead Channel Impedance Value: 417 Ohm
Lead Channel Pacing Threshold Amplitude: 0.75 V
Lead Channel Sensing Intrinsic Amplitude: 2 mV
Lead Channel Sensing Intrinsic Amplitude: 8 mV
Lead Channel Setting Pacing Amplitude: 2 V
Lead Channel Setting Pacing Amplitude: 2.5 V
Lead Channel Setting Sensing Sensitivity: 4 mV
MDC IDC MSMT LEADCHNL RV IMPEDANCE VALUE: 677 Ohm
MDC IDC MSMT LEADCHNL RV PACING THRESHOLD PULSEWIDTH: 0.4 ms
MDC IDC SET LEADCHNL RV PACING PULSEWIDTH: 0.4 ms
MDC IDC STAT BRADY AP VP PERCENT: 22.5 %

## 2015-01-19 NOTE — Progress Notes (Signed)
Pacemaker check in clinic. Normal device function. Threshold, sensing, impedances consistent with previous measurements. Device programmed to maximize longevity. 288 atrial high rate episodes (<0.1%)---max dur. 22 sec, Max Avg V 70---AT. No high ventricular rates noted. Device programmed at appropriate safety margins. Histogram distribution appropriate for patient activity level. Device programmed to optimize intrinsic conduction. Estimated longevity 2.5 years. Patient will follow up with [facility] on 6/3 @ 130pm.

## 2015-01-19 NOTE — Telephone Encounter (Signed)
Closed encounter °

## 2015-01-21 ENCOUNTER — Ambulatory Visit (INDEPENDENT_AMBULATORY_CARE_PROVIDER_SITE_OTHER): Payer: Medicare Other | Admitting: Endocrinology

## 2015-01-21 ENCOUNTER — Encounter: Payer: Self-pay | Admitting: Endocrinology

## 2015-01-21 ENCOUNTER — Ambulatory Visit (INDEPENDENT_AMBULATORY_CARE_PROVIDER_SITE_OTHER): Payer: Medicare Other

## 2015-01-21 VITALS — BP 120/60 | HR 69 | Ht 70.0 in | Wt 170.0 lb

## 2015-01-21 DIAGNOSIS — Z23 Encounter for immunization: Secondary | ICD-10-CM | POA: Diagnosis not present

## 2015-01-21 DIAGNOSIS — Z0000 Encounter for general adult medical examination without abnormal findings: Secondary | ICD-10-CM

## 2015-01-21 DIAGNOSIS — D509 Iron deficiency anemia, unspecified: Secondary | ICD-10-CM | POA: Diagnosis not present

## 2015-01-21 DIAGNOSIS — R319 Hematuria, unspecified: Secondary | ICD-10-CM | POA: Diagnosis not present

## 2015-01-21 LAB — URINALYSIS, ROUTINE W REFLEX MICROSCOPIC
HGB URINE DIPSTICK: NEGATIVE
LEUKOCYTES UA: NEGATIVE
NITRITE: NEGATIVE
RBC / HPF: NONE SEEN (ref 0–?)
Specific Gravity, Urine: 1.025 (ref 1.000–1.030)
Urine Glucose: NEGATIVE
Urobilinogen, UA: 4 — AB (ref 0.0–1.0)
pH: 6 (ref 5.0–8.0)

## 2015-01-21 LAB — CBC WITH DIFFERENTIAL/PLATELET
BASOS PCT: 0.7 % (ref 0.0–3.0)
Basophils Absolute: 0 10*3/uL (ref 0.0–0.1)
EOS PCT: 10 % — AB (ref 0.0–5.0)
Eosinophils Absolute: 0.5 10*3/uL (ref 0.0–0.7)
HEMATOCRIT: 35.6 % — AB (ref 39.0–52.0)
Hemoglobin: 12.1 g/dL — ABNORMAL LOW (ref 13.0–17.0)
LYMPHS ABS: 1 10*3/uL (ref 0.7–4.0)
Lymphocytes Relative: 20.9 % (ref 12.0–46.0)
MCHC: 34.1 g/dL (ref 30.0–36.0)
MCV: 89.9 fl (ref 78.0–100.0)
MONO ABS: 0.5 10*3/uL (ref 0.1–1.0)
MONOS PCT: 10.1 % (ref 3.0–12.0)
Neutro Abs: 2.8 10*3/uL (ref 1.4–7.7)
Neutrophils Relative %: 58.3 % (ref 43.0–77.0)
PLATELETS: 133 10*3/uL — AB (ref 150.0–400.0)
RBC: 3.96 Mil/uL — ABNORMAL LOW (ref 4.22–5.81)
RDW: 16.4 % — ABNORMAL HIGH (ref 11.5–15.5)
WBC: 4.9 10*3/uL (ref 4.0–10.5)

## 2015-01-21 LAB — IBC PANEL
Iron: 67 ug/dL (ref 42–165)
SATURATION RATIOS: 23.9 % (ref 20.0–50.0)
Transferrin: 200 mg/dL — ABNORMAL LOW (ref 212.0–360.0)

## 2015-01-21 NOTE — Progress Notes (Signed)
Subjective:    Patient ID: [name] [name], male    DOB: 25-Jul-1929, 81 y.o.   MRN: [id number]  HPI Pt is here for regular wellness examination, and is feeling pretty well in general, and says chronic med probs are stable, except as noted below Past Medical History  Diagnosis Date  . GASTRIC POLYP 05/13/2009  . COLONIC POLYPS, ADENOMATOUS 01/14/2008  . HYPERLIPIDEMIA 06/01/2007  . ANEMIA, IRON DEFICIENCY 10/13/2008  . COAGULOPATHY, COUMADIN-INDUCED 01/14/2008  . ANXIETY 09/04/2008  . HYPERTENSION 06/01/2007  . ATRIAL FIBRILLATION, CHRONIC 01/14/2008    on warfarin since 2007  . UNSPECIFIED VENOUS INSUFFICIENCY 09/18/2008  . PLEURAL EFFUSION, LEFT 10/13/2008  . GERD 06/01/2007  . BARRETTS ESOPHAGUS 03/20/2009  . PROSTATE CANCER, HX OF 01/14/2008  . CATARACTS, BILATERAL, HX OF 01/14/2008  . CEREBROVASCULAR ACCIDENT, HX OF 08/18/2008  . PERSONAL HX COLONIC POLYPS 01/01/2010  . BASAL CELL CARCINOMA OF SKIN SITE UNSPECIFIED 09/28/2010  . Hyperglycemia   . Elevated PSA   . Restless leg syndrome   . Mitral regurgitation 06/13/2008    mitral valve replacement #20 St. Jude Biocor; Maze procedure by Dr. Amador Cunas  at [facility]  . CORONARY ARTERY DISEASE 06/01/2007    Cath 04/03/2008; Cath 11/01/1990  . Tachy-brady syndrome 07/13/2008    medtronic Adapta gen. model ADDR01 ,serial # [id number] H by Dr  [name] at [facility]  . Peripheral artery disease 06/16/2010    LEA doppler-left ABI nml 0.61 calcified vessels;right ABI  0.68  . Edema, peripheral 07/30/2009    LEV doppler- no thrombus or thrombophlebitis  . Aortic valvular disorder 08/16/2012    echo EF 45-50% LV mildly reduce,Biocor mitral valve,mild to mod. tricuspid regrug.,mild to mod. aortic regrug.  . Mitral valve disorder 09/08/2011    echo EF 50-55% LV normal  . Aortic valvular stenosis 11/19/2013  . Cardiomyopathy, ischemic 11/19/2013    EF 45-50% with inferior wall hypokinesis by echo 2014  . S/P CABG x 1 06/13/2008    SVG to LAD with open vein  harvest right thigh, LIMA harvested but not utilized by Dr Amador Cunas  . S/P mitral valve replacement with bioprosthetic valve 06/13/2008    35mm St Jude Biocor porcine bioprosthetic tissue valve by Dr Amador Cunas  . S/P Maze operation for atrial fibrillation 06/13/2008    Partial left atrial lesion set using cryothermy for bilateral pulmonary vein isolation by Dr Amador Cunas  . Prosthetic valve dysfunction     Mitral stenosis involving bioprosthetic tissue valve placed 06/13/2008  . Mitral stenosis     Porcine bioprosthetic tissue valve placed 06/13/2008  . Dysrhythmia     HX OF TACHY BRADY  . Pacemaker   . HYPERTHYROIDISM 01/06/2011  . 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 Prostatectomy    . Tonsillectomy    . Cystectomy      Left leg  . Pacemaker placement  07/13/2008    Adapata dual chamber-Medtronic at [facility]   . Coronary artery bypass graft  06/13/2008    graft x1 w/vein graft LAD artery by Dr. Amador Cunas at [facility]  . Mitral valve replacement  06/13/2008    #20 St. Jude Bicor mitral valve ;Maze procedure at [facility] by Dr. Amador Cunas  . Cardiac catheterization  11/01/1990  . Skin cancer excision      removed from forehead and right leg, nose/face  .  Upper gastrointestinal endoscopy  07/14/2009    erosive reflux esopagitis, Barrett's esophagus  . Colonoscopy  07/14/2009    diverticulosis, internal hemorrhoids  . Cardiovascular stress test  06/03/2010    Persantine perfusion EF 45% mild to moderate ischemia basal and mid inferolateral region  . Cardiovascular stress test  01/01/2008    left ventricle normal,no significant ischemia  . Cardiovascular stress test  09/09/2005     possibility of ischemia inferior wall  . Tee without cardioversion N/A 01/22/2014    Procedure: TRANSESOPHAGEAL ECHOCARDIOGRAM (TEE);  Surgeon: Sanda Klein, MD;   Location: [facility] ENDOSCOPY;  Service: Cardiovascular;  Laterality: N/A;  . Coronary stent placement  03/24/2014    DES to LAD  . Transcatheter aortic valve replacement, transfemoral N/A 04/22/2014    Procedure: TRANSCATHETER AORTIC VALVE REPLACEMENT, TRANSFEMORAL;  Surgeon: [name], MD;  Location: [place];  Service: Open Heart Surgery;  Laterality: N/A;  TAVR-TF (RIGHT SIDE)  . Intraoperative transesophageal echocardiogram N/A 04/22/2014    Procedure: INTRAOPERATIVE TRANSTHORACIC ECHOCARDIOGRAM;  Surgeon: [name], MD;  Location: [place];  Service: Open Heart Surgery;  Laterality: N/A;  . Left and right heart catheterization with coronary angiogram N/A 01/08/2014    Procedure: LEFT AND RIGHT HEART CATHETERIZATION WITH CORONARY ANGIOGRAM;  Surgeon: Sanda Klein, MD;  Location: [place] CATH LAB;  Service: Cardiovascular;  Laterality: N/A;  . Percutaneous coronary rotoblator intervention (pci-r) N/A 03/24/2014    Procedure: PERCUTANEOUS CORONARY ROTOBLATOR INTERVENTION (PCI-R);  Surgeon: Blane Ohara, MD;  Location: [facility] CATH LAB;  Service: Cardiovascular;  Laterality: N/A;    History   Social History  . Marital Status: Married    Spouse Name: N/A  . Number of Children: 4  . Years of Education: N/A   Occupational History  . Retired    Social History Main Topics  . Smoking status: Former Smoker -- 2 years    Types: Pipe    Quit date: 11/07/1965  . Smokeless tobacco: Never Used     Comment: quit over 40 years ago, never smoked cigarettes  . Alcohol Use: No  . Drug Use: No  . Sexual Activity: Not on file   Other Topics Concern  . Not on file   Social History Narrative    Current Outpatient Prescriptions on File Prior to Visit  Medication Sig Dispense Refill  . amiodarone (PACERONE) 200 MG tablet Take 1 tablet (200 mg total) by mouth daily. (Patient taking differently: Take 100 mg by mouth daily. ) 90 tablet 3  . clopidogrel (PLAVIX) 75 MG tablet Take 1 tablet (75 mg total) by  mouth daily with breakfast. 90 tablet 3  . ferrous sulfate 325 (65 FE) MG tablet Take 325 mg by mouth daily with breakfast.    . methimazole (TAPAZOLE) 5 MG tablet TAKE 1 TABLET (5 MG TOTAL) BY MOUTH EVERY MONDAY, WEDNESDAY, AND FRIDAY. 36 tablet 1  . metoprolol succinate (TOPROL-XL) 50 MG 24 hr tablet TAKE 1 TABLET (50 MG TOTAL) BY MOUTH DAILY. TAKE WITH OR IMMEDIATELY FOLLOWING A MEAL. 30 tablet 5  . Multiple Vitamin (MULTIVITAMIN WITH MINERALS) TABS Take 1 tablet by mouth daily. Centrum Silver    . pantoprazole (PROTONIX) 40 MG tablet Take 1 tablet (40 mg total) by mouth 2 (two) times daily. 180 tablet 3  . simvastatin (ZOCOR) 40 MG tablet Take 1 tablet (40 mg total) by mouth at bedtime. 90 tablet 1  . Tamsulosin HCl (FLOMAX) 0.4 MG CAPS Take 0.4 mg by mouth every evening.  No current facility-administered medications on file prior to visit.    No Known Allergies  Family History  Problem Relation Age of Onset  . Breast cancer Mother     BP 120/60 mmHg  Pulse 69  Ht 5\' 10"  (1.778 m)  Wt 170 lb (77.111 kg)  BMI 24.39 kg/m2  SpO2 95%   Review of Systems  Constitutional: Negative for fever and unexpected weight change.  HENT: Negative for hearing loss.   Eyes: Negative for visual disturbance.  Respiratory: Negative for shortness of breath.   Cardiovascular: Negative for chest pain.  Gastrointestinal: Negative for anal bleeding.  Endocrine: Positive for cold intolerance.  Genitourinary: Negative for hematuria.  Musculoskeletal: Negative for back pain.  Skin: Negative for rash.  Allergic/Immunologic: Negative for environmental allergies.  Neurological: Negative for syncope.  Hematological: Bruises/bleeds easily.  Psychiatric/Behavioral: Negative for dysphoric mood.       Objective:   Physical Exam VS: see vs page GEN: no distress HEAD: head: no deformity eyes: no periorbital swelling, no proptosis external nose and ears are normal mouth: no lesion seen NECK:  supple, thyroid is not enlarged CHEST WALL: kyphosis and pacemaker are noted. LUNGS: clear to auscultation BREASTS:  No gynecomastia CV: reg rate and rhythm; soft systolic murmur.  Few varicosities on the legs ABD: abdomen is soft, nontender.  no hepatosplenomegaly.  not distended.  no hernia GENITALIA:  Normal male.   RECTAL: normal external and internal exam.  heme neg. PROSTATE:  Normal size.  No nodule MUSCULOSKELETAL: muscle bulk and strength are grossly normal.  no obvious joint swelling.  gait is normal and steady EXTEMITIES: no deformity.  no ulcer on the feet.  feet are of normal color and temp.  1+ bilat leg edema.  There is bilateral onychomycosis of the toenails.  PULSES: dorsalis pedis intact bilat.  no carotid bruit NEURO:  cn 2-12 grossly intact.   readily moves all 4's.  sensation is intact to touch on the feet SKIN:  Normal texture and temperature.  No rash or suspicious lesion is visible.  Few ecchymoses on the forearms NODES:  None palpable at the neck PSYCH: alert, well-oriented.  Does not appear anxious nor depressed.       Assessment & Plan:  Wellness visit today, with problems stable, except as noted.  Patient is advised the following: Patient Instructions  please consider these measures for your health:  minimize alcohol.  do not use tobacco products.  have a colonoscopy at least every 10 years from age 29.  keep firearms safely stored.  always use seat belts.  have working smoke alarms in your home.  see an eye doctor and dentist regularly.  never drive under the influence of alcohol or drugs (including prescription drugs).  those with fair skin should take precautions against the sun. it is critically important to prevent falling down (keep floor areas well-lit, dry, and free of loose objects.  If you have a cane, walker, or wheelchair, you should use it, even for short trips around the house.  Also, try not to rush).   A urine test is requested for you today.   We'll let you know about the results. Please come back in 2 days to have the skin test read.

## 2015-01-21 NOTE — Progress Notes (Signed)
we discussed code status.  pt requests DNR 

## 2015-01-21 NOTE — Patient Instructions (Addendum)
please consider these measures for your health:  minimize alcohol.  do not use tobacco products.  have a colonoscopy at least every 10 years from age 79.  keep firearms safely stored.  always use seat belts.  have working smoke alarms in your home.  see an eye doctor and dentist regularly.  never drive under the influence of alcohol or drugs (including prescription drugs).  those with fair skin should take precautions against the sun. it is critically important to prevent falling down (keep floor areas well-lit, dry, and free of loose objects.  If you have a cane, walker, or wheelchair, you should use it, even for short trips around the house.  Also, try not to rush).   A urine test is requested for you today.  We'll let you know about the results. Please come back in 2 days to have the skin test read.

## 2015-01-23 LAB — TB SKIN TEST
Induration: 0 mm
TB Skin Test: NEGATIVE

## 2015-02-16 ENCOUNTER — Telehealth: Payer: Self-pay | Admitting: Endocrinology

## 2015-02-16 ENCOUNTER — Other Ambulatory Visit: Payer: Self-pay

## 2015-02-16 MED ORDER — METHIMAZOLE 5 MG PO TABS: ORAL_TABLET | ORAL | Status: DC

## 2015-02-16 NOTE — Telephone Encounter (Signed)
Patient ask if you would call him concerning his medication he has some questions.

## 2015-02-16 NOTE — Telephone Encounter (Signed)
Pt requested his Methimazole be sent to [facility]. Rx sent per pt's request,

## 2015-02-20 ENCOUNTER — Telehealth: Payer: Self-pay | Admitting: Cardiovascular Disease

## 2015-02-20 ENCOUNTER — Telehealth: Payer: Self-pay | Admitting: Internal Medicine

## 2015-02-20 NOTE — Telephone Encounter (Signed)
Informed patient that we are waiting on [employer]'s response to [place] and we will contact him as soon as we know whether it is approved or denied. Pt verbalized understanding.

## 2015-02-20 NOTE — Telephone Encounter (Signed)
Clarification on dosing, he had 2 strengths of metoprolol. Advised 50mg  24 hr tablet per current med list, asked if he needed refill. He has plenty of medication on hand. Advised to call if any concerns.

## 2015-02-20 NOTE — Telephone Encounter (Signed)
Patient states he received a letter back in February that stated he can no longer receive pantoprazole if he is taking more than 30 tablets in a 30 day period. He states the letter told him that he needs a quantity exemption. I told patient to give me the phone number to [facility] that is on the letter and I will call to quantity exemption. Pt gave me the # [phone]. Called and they told me that the PA had to be done on cover my meds starting April 1st. Started PA on cover my meds and now awaiting repsponse from [facility] and it states it can take up to 3 days. Called patient to inform him but line is busy. Will call back later on.

## 2015-02-20 NOTE — Telephone Encounter (Signed)
Please call,concerning his Metoprolol. °

## 2015-02-23 ENCOUNTER — Telehealth: Payer: Self-pay | Admitting: Cardiovascular Disease

## 2015-02-23 MED ORDER — METOPROLOL SUCCINATE ER 50 MG PO TB24: ORAL_TABLET | ORAL | Status: DC

## 2015-02-23 NOTE — Telephone Encounter (Signed)
According to cover my meds pantoprazole has been approved. Notified patient.

## 2015-02-23 NOTE — Telephone Encounter (Signed)
Rx(s) sent to pharmacy electronically. Patient notified. 

## 2015-02-23 NOTE — Telephone Encounter (Signed)
°  1. Which medications need to be refilled? Metoprolol  2. Which pharmacy is medication to be sent to?[facility]   3. Do they need a 30 day or 90 day supply? 90  4. Would they like a call back once the medication has been sent to the pharmacy? Yes

## 2015-02-24 ENCOUNTER — Encounter: Payer: Self-pay | Admitting: Cardiovascular Disease

## 2015-03-03 ENCOUNTER — Telehealth: Payer: Self-pay | Admitting: Internal Medicine

## 2015-03-03 NOTE — Telephone Encounter (Signed)
Sheri please advise,thank you.

## 2015-03-03 NOTE — Telephone Encounter (Signed)
Pantoprazole is not OTC.  The patient is advised he can take OTC prilosec or Nexium until his prescription comes in the next week.

## 2015-03-11 ENCOUNTER — Ambulatory Visit (HOSPITAL_COMMUNITY): Payer: Medicare Other

## 2015-03-11 ENCOUNTER — Ambulatory Visit (HOSPITAL_COMMUNITY)
Admission: RE | Admit: 2015-03-11 | Discharge: 2015-03-11 | Disposition: A | Discharge status: Home / Self Care | Payer: Medicare Other | Source: Ambulatory Visit | Attending: Internal Medicine | Admitting: Internal Medicine

## 2015-03-11 DIAGNOSIS — Z954 Presence of other heart-valve replacement: Secondary | ICD-10-CM | POA: Insufficient documentation

## 2015-03-11 DIAGNOSIS — Z952 Presence of prosthetic heart valve: Secondary | ICD-10-CM

## 2015-03-19 ENCOUNTER — Telehealth: Payer: Self-pay | Admitting: Cardiovascular Disease

## 2015-03-19 NOTE — Telephone Encounter (Signed)
Please call,pt sill having problems with his throat.He can barely talk above a whisper.

## 2015-03-19 NOTE — Telephone Encounter (Signed)
This should go to clinical pool

## 2015-03-19 NOTE — Telephone Encounter (Signed)
I wish I could help, but I think he should see an ENT specialist. We'll gladly make him a referral. Ask him if he has a preference.

## 2015-03-19 NOTE — Telephone Encounter (Signed)
Spoke to patient. He states he still having problems with his throat. 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 RN informed patient to contact the ENT doctor --who he saw  In the past. Patient wanted Dr [name] to know,what should he do?

## 2015-03-20 ENCOUNTER — Telehealth: Payer: Self-pay | Admitting: *Deleted

## 2015-03-20 NOTE — Telephone Encounter (Signed)
CALLED LEFT MESSAGE ON PHONE ANSWER MACHINE-  DR CHRIS  [place] [phone]

## 2015-03-20 NOTE — Telephone Encounter (Signed)
Spoke to patient. Information given to patient. Patient states he will contact the ENT that he saw in the past, patient was already referred eariler.

## 2015-03-20 NOTE — Telephone Encounter (Signed)
PATIENT CALLED . PATIENT HAD FORGOTTEN THE NAME OF ENT PHYSICIAN IN THE PAST AROUND (9-08/2014). PATIENT WANTED TO KNOW THE NAME AND PHONE. RN INFORMED PATIENT WILL INVESTIGATE AND CALL HIM BACK.

## 2015-03-20 NOTE — Telephone Encounter (Signed)
Left message to call back  

## 2015-03-31 ENCOUNTER — Telehealth: Payer: Self-pay | Admitting: Endocrinology

## 2015-03-31 MED ORDER — METHIMAZOLE 5 MG PO TABS: ORAL_TABLET | ORAL | Status: DC

## 2015-03-31 NOTE — Telephone Encounter (Signed)
Patient called and would like a refill on his medication   Rx: methimazole   Pharmacy: [facility]   Thank you

## 2015-03-31 NOTE — Telephone Encounter (Signed)
Rx sent per pt's request.  

## 2015-04-03 ENCOUNTER — Telehealth: Payer: Self-pay | Admitting: Internal Medicine

## 2015-04-03 ENCOUNTER — Ambulatory Visit (INDEPENDENT_AMBULATORY_CARE_PROVIDER_SITE_OTHER): Payer: Medicare Other | Admitting: Endocrinology

## 2015-04-03 ENCOUNTER — Telehealth: Payer: Self-pay | Admitting: Family Medicine

## 2015-04-03 ENCOUNTER — Encounter (HOSPITAL_COMMUNITY): Payer: Self-pay | Admitting: *Deleted

## 2015-04-03 ENCOUNTER — Emergency Department (HOSPITAL_COMMUNITY): Payer: Medicare Other

## 2015-04-03 ENCOUNTER — Emergency Department (HOSPITAL_COMMUNITY)
Admission: EM | Admit: 2015-04-03 | Discharge: 2015-04-03 | Disposition: A | Discharge status: Home / Self Care | Payer: Medicare Other | Attending: Emergency Medicine | Admitting: Emergency Medicine

## 2015-04-03 ENCOUNTER — Encounter: Payer: Self-pay | Admitting: Endocrinology

## 2015-04-03 VITALS — BP 106/70 | HR 69 | Temp 97.8°F | Ht 70.0 in | Wt 169.0 lb

## 2015-04-03 DIAGNOSIS — J90 Pleural effusion, not elsewhere classified: Secondary | ICD-10-CM | POA: Diagnosis not present

## 2015-04-03 DIAGNOSIS — Z85828 Personal history of other malignant neoplasm of skin: Secondary | ICD-10-CM | POA: Insufficient documentation

## 2015-04-03 DIAGNOSIS — R609 Edema, unspecified: Secondary | ICD-10-CM | POA: Insufficient documentation

## 2015-04-03 DIAGNOSIS — Z951 Presence of aortocoronary bypass graft: Secondary | ICD-10-CM | POA: Diagnosis not present

## 2015-04-03 DIAGNOSIS — Z7901 Long term (current) use of anticoagulants: Secondary | ICD-10-CM | POA: Diagnosis not present

## 2015-04-03 DIAGNOSIS — Z8659 Personal history of other mental and behavioral disorders: Secondary | ICD-10-CM | POA: Diagnosis not present

## 2015-04-03 DIAGNOSIS — I2510 Atherosclerotic heart disease of native coronary artery without angina pectoris: Secondary | ICD-10-CM | POA: Insufficient documentation

## 2015-04-03 DIAGNOSIS — E0590 Thyrotoxicosis, unspecified without thyrotoxic crisis or storm: Secondary | ICD-10-CM | POA: Insufficient documentation

## 2015-04-03 DIAGNOSIS — Z9889 Other specified postprocedural states: Secondary | ICD-10-CM | POA: Insufficient documentation

## 2015-04-03 DIAGNOSIS — M40204 Unspecified kyphosis, thoracic region: Secondary | ICD-10-CM | POA: Diagnosis not present

## 2015-04-03 DIAGNOSIS — Z79899 Other long term (current) drug therapy: Secondary | ICD-10-CM | POA: Diagnosis not present

## 2015-04-03 DIAGNOSIS — N390 Urinary tract infection, site not specified: Secondary | ICD-10-CM | POA: Diagnosis not present

## 2015-04-03 DIAGNOSIS — K219 Gastro-esophageal reflux disease without esophagitis: Secondary | ICD-10-CM | POA: Insufficient documentation

## 2015-04-03 DIAGNOSIS — D509 Iron deficiency anemia, unspecified: Secondary | ICD-10-CM

## 2015-04-03 DIAGNOSIS — I482 Chronic atrial fibrillation: Secondary | ICD-10-CM | POA: Insufficient documentation

## 2015-04-03 DIAGNOSIS — E785 Hyperlipidemia, unspecified: Secondary | ICD-10-CM | POA: Diagnosis not present

## 2015-04-03 DIAGNOSIS — Z7902 Long term (current) use of antithrombotics/antiplatelets: Secondary | ICD-10-CM | POA: Insufficient documentation

## 2015-04-03 DIAGNOSIS — D72829 Elevated white blood cell count, unspecified: Secondary | ICD-10-CM | POA: Insufficient documentation

## 2015-04-03 DIAGNOSIS — Z9861 Coronary angioplasty status: Secondary | ICD-10-CM | POA: Insufficient documentation

## 2015-04-03 DIAGNOSIS — I10 Essential (primary) hypertension: Secondary | ICD-10-CM | POA: Insufficient documentation

## 2015-04-03 DIAGNOSIS — Z8546 Personal history of malignant neoplasm of prostate: Secondary | ICD-10-CM | POA: Diagnosis not present

## 2015-04-03 DIAGNOSIS — Z8673 Personal history of transient ischemic attack (TIA), and cerebral infarction without residual deficits: Secondary | ICD-10-CM | POA: Diagnosis not present

## 2015-04-03 DIAGNOSIS — Z86010 Personal history of colonic polyps: Secondary | ICD-10-CM | POA: Insufficient documentation

## 2015-04-03 DIAGNOSIS — Z86018 Personal history of other benign neoplasm: Secondary | ICD-10-CM | POA: Diagnosis not present

## 2015-04-03 DIAGNOSIS — R7989 Other specified abnormal findings of blood chemistry: Secondary | ICD-10-CM | POA: Diagnosis present

## 2015-04-03 DIAGNOSIS — Z87891 Personal history of nicotine dependence: Secondary | ICD-10-CM | POA: Diagnosis not present

## 2015-04-03 LAB — CBC WITH DIFFERENTIAL/PLATELET
BASOS PCT: 0 % (ref 0–1)
Basophils Absolute: 0 10*3/uL (ref 0.0–0.1)
Basophils Absolute: 0 10*3/uL (ref 0.0–0.1)
Basophils Relative: 0 % (ref 0.0–3.0)
EOS ABS: 0 10*3/uL (ref 0.0–0.7)
EOS PCT: 0 % (ref 0–5)
Eosinophils Absolute: 0 10*3/uL (ref 0.0–0.7)
Eosinophils Relative: 0.1 % (ref 0.0–5.0)
HCT: 36.8 % — ABNORMAL LOW (ref 39.0–52.0)
HCT: 37.5 % — ABNORMAL LOW (ref 39.0–52.0)
HEMOGLOBIN: 12.5 g/dL — AB (ref 13.0–17.0)
Hemoglobin: 12.3 g/dL — ABNORMAL LOW (ref 13.0–17.0)
LYMPHS ABS: 0.9 10*3/uL (ref 0.7–4.0)
LYMPHS PCT: 4.3 % — AB (ref 12.0–46.0)
Lymphocytes Relative: 5 % — ABNORMAL LOW (ref 12–46)
Lymphs Abs: 0.9 10*3/uL (ref 0.7–4.0)
MCH: 31.3 pg (ref 26.0–34.0)
MCHC: 33.5 g/dL (ref 30.0–36.0)
MCHC: 33.3 g/dL (ref 30.0–36.0)
MCV: 94.4 fl (ref 78.0–100.0)
MCV: 94 fL (ref 78.0–100.0)
MONO ABS: 1 10*3/uL (ref 0.1–1.0)
MONOS PCT: 5 % (ref 3.0–12.0)
MONOS PCT: 6 % (ref 3–12)
Monocytes Absolute: 1.1 10*3/uL — ABNORMAL HIGH (ref 0.1–1.0)
NEUTROS ABS: 18.7 10*3/uL — AB (ref 1.4–7.7)
NEUTROS PCT: 89 % — AB (ref 43–77)
Neutro Abs: 16.9 10*3/uL — ABNORMAL HIGH (ref 1.7–7.7)
Neutrophils Relative %: 90.6 % — ABNORMAL HIGH (ref 43.0–77.0)
PLATELETS: 96 10*3/uL — AB (ref 150.0–400.0)
PLATELETS: 98 10*3/uL — AB (ref 150–400)
RBC: 3.9 Mil/uL — ABNORMAL LOW (ref 4.22–5.81)
RBC: 3.99 MIL/uL — ABNORMAL LOW (ref 4.22–5.81)
RDW: 14.9 % (ref 11.5–15.5)
RDW: 14.1 % (ref 11.5–15.5)
WBC: 20.6 10*3/uL — AB (ref 4.0–10.5)
WBC: 18.9 10*3/uL — ABNORMAL HIGH (ref 4.0–10.5)

## 2015-04-03 LAB — BASIC METABOLIC PANEL
Anion gap: 10 (ref 5–15)
BUN: 38 mg/dL — ABNORMAL HIGH (ref 6–20)
CO2: 24 mmol/L (ref 22–32)
Calcium: 8.7 mg/dL — ABNORMAL LOW (ref 8.9–10.3)
Chloride: 105 mmol/L (ref 101–111)
Creatinine, Ser: 1.4 mg/dL — ABNORMAL HIGH (ref 0.61–1.24)
GFR calc Af Amer: 52 mL/min — ABNORMAL LOW (ref >60)
GFR calc non Af Amer: 45 mL/min — ABNORMAL LOW (ref >60)
Glucose, Bld: 88 mg/dL (ref 65–99)
Potassium: 4 mmol/L (ref 3.5–5.1)
SODIUM: 139 mmol/L (ref 135–145)

## 2015-04-03 LAB — URINALYSIS, ROUTINE W REFLEX MICROSCOPIC
Glucose, UA: NEGATIVE mg/dL
HGB URINE DIPSTICK: NEGATIVE
Hgb urine dipstick: NEGATIVE
Ketones, ur: 15 mg/dL — AB
LEUKOCYTES UA: NEGATIVE
NITRITE: NEGATIVE
NITRITE: NEGATIVE
Protein, ur: NEGATIVE mg/dL
Specific Gravity, Urine: 1.025 (ref 1.000–1.030)
Specific Gravity, Urine: 1.028 (ref 1.005–1.030)
TOTAL PROTEIN, URINE-UPE24: 30 — AB
URINE GLUCOSE: NEGATIVE
Urobilinogen, UA: 1 (ref 0.0–1.0)
Urobilinogen, UA: 1 mg/dL (ref 0.0–1.0)
pH: 5.5 (ref 5.0–8.0)
pH: 5 (ref 5.0–8.0)

## 2015-04-03 LAB — URINE MICROSCOPIC-ADD ON

## 2015-04-03 LAB — I-STAT CG4 LACTIC ACID, ED: Lactic Acid, Venous: 0.92 mmol/L (ref 0.5–2.0)

## 2015-04-03 MED ORDER — SODIUM CHLORIDE 0.9 % IV BOLUS (SEPSIS)
500.0000 mL | Freq: Once | INTRAVENOUS | Status: AC
  Administered 2015-04-03: 500 mL via INTRAVENOUS

## 2015-04-03 MED ORDER — CEFTRIAXONE SODIUM 1 G IJ SOLR
1.0000 g | Freq: Once | INTRAMUSCULAR | Status: AC
  Administered 2015-04-03: 1 g via INTRAVENOUS
  Filled 2015-04-03: qty 10

## 2015-04-03 MED ORDER — CEPHALEXIN 500 MG PO CAPS: 500.0000 mg | ORAL_CAPSULE | Freq: Two times a day (BID) | ORAL | Status: DC

## 2015-04-03 NOTE — Progress Notes (Signed)
Subjective:    Patient ID: [name] [name], male    DOB: [birth year], 81 y.o.   MRN: [id number]  HPI Hx is from pt and wife.  last night, pt awoke in the middle of the night with moderate tremor of the hands, and assoc cold intolerance.  He finished prednisone approx 1 week ago. Past Medical History  Diagnosis Date  . GASTRIC POLYP 05/13/2009  . COLONIC POLYPS, ADENOMATOUS 01/14/2008  . HYPERLIPIDEMIA 06/01/2007  . ANEMIA, IRON DEFICIENCY 10/13/2008  . COAGULOPATHY, COUMADIN-INDUCED 01/14/2008  . ANXIETY 09/04/2008  . HYPERTENSION 06/01/2007  . ATRIAL FIBRILLATION, CHRONIC 01/14/2008    on warfarin since 2007  . UNSPECIFIED VENOUS INSUFFICIENCY 09/18/2008  . PLEURAL EFFUSION, LEFT 10/13/2008  . GERD 06/01/2007  . BARRETTS ESOPHAGUS 03/20/2009  . PROSTATE CANCER, HX OF 01/14/2008  . CATARACTS, BILATERAL, HX OF 01/14/2008  . CEREBROVASCULAR ACCIDENT, HX OF 08/18/2008  . PERSONAL HX COLONIC POLYPS 01/01/2010  . BASAL CELL CARCINOMA OF SKIN SITE UNSPECIFIED 09/28/2010  . Hyperglycemia   . Elevated PSA   . Restless leg syndrome   . Mitral regurgitation 06/13/2008    mitral valve replacement #20 St. Jude Biocor; Maze procedure by Dr. Amador Cunas  at [facility]  . CORONARY ARTERY DISEASE 06/01/2007    Cath 04/03/2008; Cath 11/01/1990  . Tachy-brady syndrome 07/13/2008    medtronic Adapta gen. model ADDR01 ,serial # [id number] H by Dr  [name] at [facility]  . Peripheral artery disease 06/16/2010    LEA doppler-left ABI nml 0.61 calcified vessels;right ABI  0.68  . Edema, peripheral 07/30/2009    LEV doppler- no thrombus or thrombophlebitis  . Aortic valvular disorder 08/16/2012    echo EF 45-50% LV mildly reduce,Biocor mitral valve,mild to mod. tricuspid regrug.,mild to mod. aortic regrug.  . Mitral valve disorder 09/08/2011    echo EF 50-55% LV normal  . Aortic valvular stenosis 11/19/2013  . Cardiomyopathy, ischemic 11/19/2013    EF 45-50% with inferior wall hypokinesis by echo 2014  . S/P CABG x 1 06/13/2008     SVG to LAD with open vein harvest right thigh, LIMA harvested but not utilized by Dr Amador Cunas  . S/P mitral valve replacement with bioprosthetic valve 06/13/2008    48mm St Jude Biocor porcine bioprosthetic tissue valve by Dr Amador Cunas  . S/P Maze operation for atrial fibrillation 06/13/2008    Partial left atrial lesion set using cryothermy for bilateral pulmonary vein isolation by Dr Amador Cunas  . Prosthetic valve dysfunction     Mitral stenosis involving bioprosthetic tissue valve placed 06/13/2008  . Mitral stenosis     Porcine bioprosthetic tissue valve placed 06/13/2008  . Dysrhythmia     HX OF TACHY BRADY  . Pacemaker   . HYPERTHYROIDISM 01/06/2011  . 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 Prostatectomy    . Tonsillectomy    . Cystectomy      Left leg  . Pacemaker placement  07/13/2008    [place] dual chamber-Medtronic at [facility]   . Coronary artery bypass graft  06/13/2008    graft x1 w/vein graft LAD artery by Dr. Amador Cunas at [facility]  . Mitral valve replacement  06/13/2008    #20 St. Jude Bicor mitral valve ;Maze procedure at [facility] by Dr. Amador Cunas  . Cardiac catheterization  11/01/1990  . Skin cancer excision  removed from forehead and right leg, nose/face  . Upper gastrointestinal endoscopy  07/14/2009    erosive reflux esopagitis, Barrett's esophagus  . Colonoscopy  07/14/2009    diverticulosis, internal hemorrhoids  . Cardiovascular stress test  06/03/2010    Persantine perfusion EF 45% mild to moderate ischemia basal and mid inferolateral region  . Cardiovascular stress test  01/01/2008    left ventricle normal,no significant ischemia  . Cardiovascular stress test  09/09/2005     possibility of ischemia inferior wall  . Tee without cardioversion N/A 01/22/2014    Procedure: TRANSESOPHAGEAL ECHOCARDIOGRAM (TEE);   Surgeon: Sanda Klein, MD;  Location: [facility] ENDOSCOPY;  Service: Cardiovascular;  Laterality: N/A;  . Coronary stent placement  03/24/2014    DES to LAD  . Transcatheter aortic valve replacement, transfemoral N/A 04/22/2014    Procedure: TRANSCATHETER AORTIC VALVE REPLACEMENT, TRANSFEMORAL;  Surgeon: [name], MD;  Location: [place];  Service: Open Heart Surgery;  Laterality: N/A;  TAVR-TF (RIGHT SIDE)  . Intraoperative transesophageal echocardiogram N/A 04/22/2014    Procedure: INTRAOPERATIVE TRANSTHORACIC ECHOCARDIOGRAM;  Surgeon: [name], MD;  Location: [place];  Service: Open Heart Surgery;  Laterality: N/A;  . Left and right heart catheterization with coronary angiogram N/A 01/08/2014    Procedure: LEFT AND RIGHT HEART CATHETERIZATION WITH CORONARY ANGIOGRAM;  Surgeon: Sanda Klein, MD;  Location: [place] CATH LAB;  Service: Cardiovascular;  Laterality: N/A;  . Percutaneous coronary rotoblator intervention (pci-r) N/A 03/24/2014    Procedure: PERCUTANEOUS CORONARY ROTOBLATOR INTERVENTION (PCI-R);  Surgeon: Blane Ohara, MD;  Location: [facility] CATH LAB;  Service: Cardiovascular;  Laterality: N/A;    History   Social History  . Marital Status: Married    Spouse Name: N/A  . Number of Children: 4  . Years of Education: N/A   Occupational History  . Retired    Social History Main Topics  . Smoking status: Former Smoker -- 2 years    Types: Pipe    Quit date: 11/07/1965  . Smokeless tobacco: Never Used     Comment: quit over 40 years ago, never smoked cigarettes  . Alcohol Use: No  . Drug Use: No  . Sexual Activity: Not on file   Other Topics Concern  . Not on file   Social History Narrative    Current Outpatient Prescriptions on File Prior to Visit  Medication Sig Dispense Refill  . amiodarone (PACERONE) 200 MG tablet Take 1 tablet (200 mg total) by mouth daily. (Patient taking differently: Take 100 mg by mouth daily. ) 90 tablet 3  . clopidogrel (PLAVIX) 75 MG tablet Take  1 tablet (75 mg total) by mouth daily with breakfast. 90 tablet 3  . ferrous sulfate 325 (65 FE) MG tablet Take 325 mg by mouth daily with breakfast.    . methimazole (TAPAZOLE) 5 MG tablet TAKE 1 TABLET (5 MG TOTAL) BY MOUTH EVERY MONDAY, WEDNESDAY, AND FRIDAY. 36 tablet 2  . metoprolol succinate (TOPROL-XL) 50 MG 24 hr tablet TAKE 1 TABLET (50 MG TOTAL) BY MOUTH DAILY. TAKE WITH OR IMMEDIATELY FOLLOWING A MEAL. 90 tablet 3  . Multiple Vitamin (MULTIVITAMIN WITH MINERALS) TABS Take 1 tablet by mouth daily. Centrum Silver    . pantoprazole (PROTONIX) 40 MG tablet Take 1 tablet (40 mg total) by mouth 2 (two) times daily. 180 tablet 3  . simvastatin (ZOCOR) 40 MG tablet Take 1 tablet (40 mg total) by mouth at bedtime. 90 tablet 1  . Tamsulosin HCl (FLOMAX) 0.4 MG CAPS Take 0.4 mg  by mouth every evening.      No current facility-administered medications on file prior to visit.    No Known Allergies  Family History  Problem Relation Age of Onset  . Breast cancer Mother     BP 106/70 mmHg  Pulse 69  Temp(Src) 97.8 F (36.6 C) (Oral)  Ht 5\' 10"  (1.778 m)  Wt 169 lb (76.658 kg)  BMI 24.25 kg/m2  SpO2 94%   Review of Systems Denies fever, cough, rash, and dysuria.    Objective:   Physical Exam VITAL SIGNS:  See vs page GENERAL: no distress LUNGS:  Clear to auscultation. Gait: slow but steady.      Lab Results  Component Value Date   WBC 18.9* 04/03/2015   HGB 12.5* 04/03/2015   HCT 37.5* 04/03/2015   MCV 94.0 04/03/2015   PLT 98* 04/03/2015   CXR: NAD    Assessment & Plan:  Leukocytosis, new, uncertain etiology.   Patient is advised the following: Patient Instructions  blood tests and a chest x-ray, are requested for you today.  We'll let you know about the results. I hope you feel better soon.  If you don't feel better by next week, please call back.  Please call sooner if you get worse.  addendum: pt is advised to go to ER.

## 2015-04-03 NOTE — Patient Instructions (Signed)
blood tests and a chest x-ray, are requested for you today.  We'll let you know about the results. I hope you feel better soon.  If you don't feel better by next week, please call back.  Please call sooner if you get worse.

## 2015-04-03 NOTE — Discharge Instructions (Signed)
Get rechecked immediately if you develop fevers, difficulty breathing, vomiting, or new concerning symptoms. You are dehydrated today and your kidney function is slightly abnormal - please get this rechecked by your family doctor in the next week.   Leukocytosis Leukocytosis means you have more white blood cells than normal. White blood cells are made in your bone marrow. The main job of white blood cells is to fight infection. Having too many white blood cells is a common condition. It can develop as a result of many types of medical problems. CAUSES  In some cases, your bone marrow may be normal, but it is still making too many white blood cells. This could be the result of:  Infection.  Injury.  Physical stress.  Emotional stress.  Surgery.  Allergic reactions.  Tumors that do not start in the blood or bone marrow.  An inherited disease.  Certain medicines.  Pregnancy and labor. In other cases, you may have a bone marrow disorder that is causing your body to make too many white blood cells. Bone marrow disorders include:  Leukemia. This is a type of blood cancer.  Myeloproliferative disorders. These disorders cause blood cells to grow abnormally. SYMPTOMS  Some people have no symptoms. Others have symptoms due to the medical problem that is causing their leukocytosis. These symptoms may include:  Bleeding.  Bruising.  Fever.  Night sweats.  Repeated infections.  Weakness.  Weight loss. DIAGNOSIS  Leukocytosis is often found during blood tests that are done as part of a normal physical exam. Your caregiver will probably order other tests to help determine why you have too many white blood cells. These tests may include:  A complete blood count (CBC). This test measures all the types of blood cells in your body.  Chest X-rays, urine tests (urinalysis), or other tests to look for signs of infection.  Bone marrow aspiration. For this test, a needle is put into your  bone. Cells from the bone marrow are removed through the needle. The cells are then examined under a microscope. TREATMENT  Treatment is usually not needed for leukocytosis. However, if a disorder is causing your leukocytosis, it will need to be treated. Treatment may include:  Antibiotic medicines if you have a bacterial infection.  Bone marrow transplant. Your diseased bone marrow is replaced with healthy cells that will grow new bone marrow.  Chemotherapy. This is the use of drugs to kill cancer cells. HOME CARE INSTRUCTIONS  Only take over-the-counter or prescription medicines as directed by your caregiver.  Maintain a healthy weight. Ask your caregiver what weight is best for you.  Eat foods that are low in saturated fats and high in fiber. Eat plenty of fruits and vegetables.  Drink enough fluids to keep your urine clear or pale yellow.  Get 30 minutes of exercise at least 5 times a week. Check with your caregiver before starting a new exercise routine.  Limit caffeine and alcohol.  Do not smoke.  Keep all follow-up appointments as directed by your caregiver. SEEK MEDICAL CARE IF:  You feel weak or more tired than usual.  You develop chills, a cough, or nasal congestion.  You lose weight without trying.  You have night sweats.  You bruise easily. SEEK IMMEDIATE MEDICAL CARE IF:  You bleed more than normal.  You have chest pain.  You have trouble breathing.  You have a fever.  You have uncontrolled nausea or vomiting.  You feel dizzy or lightheaded. MAKE SURE YOU:  Understand  these instructions.  Will watch your condition.  Will get help right away if you are not doing well or get worse. Document Released: 10/13/2011 Document Revised: 01/16/2012 Document Reviewed: 10/13/2011 [facility] Patient Information 2015 [place], [place]. This information is not intended to replace advice given to you by your health care provider. Make sure you discuss any questions  you have with your health care provider.  Urinary Tract Infection Urinary tract infections (UTIs) can develop anywhere along your urinary tract. Your urinary tract is your body's drainage system for removing wastes and extra water. Your urinary tract includes two kidneys, two ureters, a bladder, and a urethra. Your kidneys are a pair of bean-shaped organs. Each kidney is about the size of your fist. They are located below your ribs, one on each side of your spine. CAUSES Infections are caused by microbes, which are microscopic organisms, including fungi, viruses, and bacteria. These organisms are so small that they can only be seen through a microscope. Bacteria are the microbes that most commonly cause UTIs. SYMPTOMS  Symptoms of UTIs may vary by age and gender of the patient and by the location of the infection. Symptoms in young women typically include a frequent and intense urge to urinate and a painful, burning feeling in the bladder or urethra during urination. Older women and men are more likely to be tired, shaky, and weak and have muscle aches and abdominal pain. A fever may mean the infection is in your kidneys. Other symptoms of a kidney infection include pain in your back or sides below the ribs, nausea, and vomiting. DIAGNOSIS To diagnose a UTI, your caregiver will ask you about your symptoms. Your caregiver also will ask to provide a urine sample. The urine sample will be tested for bacteria and white blood cells. White blood cells are made by your body to help fight infection. TREATMENT  Typically, UTIs can be treated with medication. Because most UTIs are caused by a bacterial infection, they usually can be treated with the use of antibiotics. The choice of antibiotic and length of treatment depend on your symptoms and the type of bacteria causing your infection. HOME CARE INSTRUCTIONS  If you were prescribed antibiotics, take them exactly as your caregiver instructs you. Finish the  medication even if you feel better after you have only taken some of the medication.  Drink enough water and fluids to keep your urine clear or pale yellow.  Avoid caffeine, tea, and carbonated beverages. They tend to irritate your bladder.  Empty your bladder often. Avoid holding urine for long periods of time.  Empty your bladder before and after sexual intercourse.  After a bowel movement, women should cleanse from front to back. Use each tissue only once. SEEK MEDICAL CARE IF:   You have back pain.  You develop a fever.  Your symptoms do not begin to resolve within 3 days. SEEK IMMEDIATE MEDICAL CARE IF:   You have severe back pain or lower abdominal pain.  You develop chills.  You have nausea or vomiting.  You have continued burning or discomfort with urination. MAKE SURE YOU:   Understand these instructions.  Will watch your condition.  Will get help right away if you are not doing well or get worse. Document Released: 08/03/2005 Document Revised: 04/24/2012 Document Reviewed: 12/02/2011 [facility] Patient Information 2015 [place], [place]. This information is not intended to replace advice given to you by your health care provider. Make sure you discuss any questions you have with your health care  provider.

## 2015-04-03 NOTE — ED Notes (Signed)
Patient returned from X-ray 

## 2015-04-03 NOTE — ED Notes (Signed)
Patient transported to X-ray 

## 2015-04-03 NOTE — Telephone Encounter (Signed)
Received a page to call Earlie Server (wife) because patient was "feeling very bad".  Upon calling back, she passes the phone to [place] at which time he proceeds to say that he is "cold".  Reports turning on the furnace and the heat in the house but he is still cold.  He denies any other symptoms aside from being cold.  I recommended they call their PCP.

## 2015-04-03 NOTE — ED Provider Notes (Signed)
CSN: [id number]     Arrival date & time 04/03/15  1813 History   First MD Initiated Contact with Patient 04/03/15 1859     Chief Complaint  Patient presents with  . Abnormal Lab     The history is provided by the patient. No language interpreter was used.   [name] [name] presents for evaluation of chills. He was in his routine state of health until last night when he woke up with shaking chills. He denies any fevers, cough, chest pain, abdominal pain, vomiting, diarrhea, dysuria. He recently had a skin cancer removed. He finished a course of steroids of week ago. He had a CBC checked today and it was noted to be elevated and he was referred to the ED for further evaluation. Symptoms are moderate, waxing and waning.  Past Medical History  Diagnosis Date  . GASTRIC POLYP 05/13/2009  . COLONIC POLYPS, ADENOMATOUS 01/14/2008  . HYPERLIPIDEMIA 06/01/2007  . ANEMIA, IRON DEFICIENCY 10/13/2008  . COAGULOPATHY, COUMADIN-INDUCED 01/14/2008  . ANXIETY 09/04/2008  . HYPERTENSION 06/01/2007  . ATRIAL FIBRILLATION, CHRONIC 01/14/2008    on warfarin since 2007  . UNSPECIFIED VENOUS INSUFFICIENCY 09/18/2008  . PLEURAL EFFUSION, LEFT 10/13/2008  . GERD 06/01/2007  . BARRETTS ESOPHAGUS 03/20/2009  . PROSTATE CANCER, HX OF 01/14/2008  . CATARACTS, BILATERAL, HX OF 01/14/2008  . CEREBROVASCULAR ACCIDENT, HX OF 08/18/2008  . PERSONAL HX COLONIC POLYPS 01/01/2010  . BASAL CELL CARCINOMA OF SKIN SITE UNSPECIFIED 09/28/2010  . Hyperglycemia   . Elevated PSA   . Restless leg syndrome   . Mitral regurgitation 06/13/2008    mitral valve replacement #20 St. Jude Biocor; Maze procedure by Dr. Amador Cunas  at [facility]  . CORONARY ARTERY DISEASE 06/01/2007    Cath 04/03/2008; Cath 11/01/1990  . Tachy-brady syndrome 07/13/2008    medtronic Adapta gen. model ADDR01 ,serial # [id number] H by Dr  [name] at [facility]  . Peripheral artery disease 06/16/2010    LEA doppler-left ABI nml 0.61 calcified vessels;right ABI  0.68  . Edema,  peripheral 07/30/2009    LEV doppler- no thrombus or thrombophlebitis  . Aortic valvular disorder 08/16/2012    echo EF 45-50% LV mildly reduce,Biocor mitral valve,mild to mod. tricuspid regrug.,mild to mod. aortic regrug.  . Mitral valve disorder 09/08/2011    echo EF 50-55% LV normal  . Aortic valvular stenosis 11/19/2013  . Cardiomyopathy, ischemic 11/19/2013    EF 45-50% with inferior wall hypokinesis by echo 2014  . S/P CABG x 1 06/13/2008    SVG to LAD with open vein harvest right thigh, LIMA harvested but not utilized by Dr Amador Cunas  . S/P mitral valve replacement with bioprosthetic valve 06/13/2008    22mm St Jude Biocor porcine bioprosthetic tissue valve by Dr Amador Cunas  . S/P Maze operation for atrial fibrillation 06/13/2008    Partial left atrial lesion set using cryothermy for bilateral pulmonary vein isolation by Dr Amador Cunas  . Prosthetic valve dysfunction     Mitral stenosis involving bioprosthetic tissue valve placed 06/13/2008  . Mitral stenosis     Porcine bioprosthetic tissue valve placed 06/13/2008  . Dysrhythmia     HX OF TACHY BRADY  . Pacemaker   . HYPERTHYROIDISM 01/06/2011  . 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 Prostatectomy    . Tonsillectomy    . Cystectomy  Left leg  . Pacemaker placement  07/13/2008    Adapata dual chamber-Medtronic at [facility]   . Coronary artery bypass graft  06/13/2008    graft x1 w/vein graft LAD artery by Dr. Amador Cunas at [facility]  . Mitral valve replacement  06/13/2008    #20 St. Jude Bicor mitral valve ;Maze procedure at [facility] by Dr. Amador Cunas  . Cardiac catheterization  11/01/1990  . Skin cancer excision      removed from forehead and right leg, nose/face  . Upper gastrointestinal endoscopy  07/14/2009    erosive reflux esopagitis, Barrett's esophagus  . Colonoscopy  07/14/2009     diverticulosis, internal hemorrhoids  . Cardiovascular stress test  06/03/2010    Persantine perfusion EF 45% mild to moderate ischemia basal and mid inferolateral region  . Cardiovascular stress test  01/01/2008    left ventricle normal,no significant ischemia  . Cardiovascular stress test  09/09/2005     possibility of ischemia inferior wall  . Tee without cardioversion N/A 01/22/2014    Procedure: TRANSESOPHAGEAL ECHOCARDIOGRAM (TEE);  Surgeon: Sanda Klein, MD;  Location: [facility] ENDOSCOPY;  Service: Cardiovascular;  Laterality: N/A;  . Coronary stent placement  03/24/2014    DES to LAD  . Transcatheter aortic valve replacement, transfemoral N/A 04/22/2014    Procedure: TRANSCATHETER AORTIC VALVE REPLACEMENT, TRANSFEMORAL;  Surgeon: [name], MD;  Location: [place];  Service: Open Heart Surgery;  Laterality: N/A;  TAVR-TF (RIGHT SIDE)  . Intraoperative transesophageal echocardiogram N/A 04/22/2014    Procedure: INTRAOPERATIVE TRANSTHORACIC ECHOCARDIOGRAM;  Surgeon: [name], MD;  Location: [place];  Service: Open Heart Surgery;  Laterality: N/A;  . Left and right heart catheterization with coronary angiogram N/A 01/08/2014    Procedure: LEFT AND RIGHT HEART CATHETERIZATION WITH CORONARY ANGIOGRAM;  Surgeon: Sanda Klein, MD;  Location: [place] CATH LAB;  Service: Cardiovascular;  Laterality: N/A;  . Percutaneous coronary rotoblator intervention (pci-r) N/A 03/24/2014    Procedure: PERCUTANEOUS CORONARY ROTOBLATOR INTERVENTION (PCI-R);  Surgeon: Blane Ohara, MD;  Location: [facility] CATH LAB;  Service: Cardiovascular;  Laterality: N/A;   Family History  Problem Relation Age of Onset  . Breast cancer Mother    History  Substance Use Topics  . Smoking status: Former Smoker -- 2 years    Types: Pipe    Quit date: 11/07/1965  . Smokeless tobacco: Never Used     Comment: quit over 40 years ago, never smoked cigarettes  . Alcohol Use: No    Review of Systems  All other systems reviewed  and are negative.     Allergies  Review of patient's allergies indicates no known allergies.  Home Medications   Prior to Admission medications   Medication Sig Start Date End Date Taking? Authorizing Provider  amiodarone (PACERONE) 200 MG tablet Take 1 tablet (200 mg total) by mouth daily. Patient taking differently: Take 100 mg by mouth daily.  05/12/14   [name] [name], MD  clopidogrel (PLAVIX) 75 MG tablet Take 1 tablet (75 mg total) by mouth daily with breakfast. 12/10/14   Sanda Klein, MD  ferrous sulfate 325 (65 FE) MG tablet Take 325 mg by mouth daily with breakfast.    Historical Provider, MD  methimazole (TAPAZOLE) 5 MG tablet TAKE 1 TABLET (5 MG TOTAL) BY MOUTH EVERY MONDAY, WEDNESDAY, AND FRIDAY. 03/31/15   [name], MD  metoprolol succinate (TOPROL-XL) 50 MG 24 hr tablet TAKE 1 TABLET (50 MG TOTAL) BY MOUTH DAILY. TAKE WITH OR IMMEDIATELY FOLLOWING A MEAL. 02/23/15   [name] [name],  MD  Multiple Vitamin (MULTIVITAMIN WITH MINERALS) TABS Take 1 tablet by mouth daily. Centrum Silver    Historical Provider, MD  pantoprazole (PROTONIX) 40 MG tablet Take 1 tablet (40 mg total) by mouth 2 (two) times daily. 12/30/14   Gatha Mayer, MD  simvastatin (ZOCOR) 40 MG tablet Take 1 tablet (40 mg total) by mouth at bedtime. 10/24/14   [name], MD  Tamsulosin HCl (FLOMAX) 0.4 MG CAPS Take 0.4 mg by mouth every evening.     Historical Provider, MD   BP 128/71 mmHg  Pulse 69  Temp(Src) 97.4 F (36.3 C) (Oral)  Resp 18  SpO2 97% Physical Exam  Constitutional: He is oriented to person, place, and time. He appears well-developed and well-nourished.  HENT:  Head: Normocephalic and atraumatic.  Sutures over left scalp, clean, dry, intact, no surrounding erythema or cellulitis.  Cardiovascular: Normal rate and regular rhythm.   No murmur heard. Pulmonary/Chest: Effort normal and breath sounds normal. No respiratory distress.  Kyphosis  Abdominal: Soft. There is no tenderness. There  is no rebound and no guarding.  Musculoskeletal: He exhibits no tenderness.  1-2+ pitting edema bilaterally.  Neurological: He is alert and oriented to person, place, and time.  Generalized weakness  Skin: Skin is warm and dry.  Psychiatric: He has a normal mood and affect. His behavior is normal.  Nursing note and vitals reviewed.   ED Course  Procedures (including critical care time) Labs Review Labs Reviewed  CBC WITH DIFFERENTIAL/PLATELET - Abnormal; Notable for the following:    WBC 18.9 (*)    RBC 3.99 (*)    Hemoglobin 12.5 (*)    HCT 37.5 (*)    Platelets 98 (*)    Neutrophils Relative % 89 (*)    Lymphocytes Relative 5 (*)    Neutro Abs 16.9 (*)    Monocytes Absolute 1.1 (*)    All other components within normal limits  BASIC METABOLIC PANEL - Abnormal; Notable for the following:    BUN 38 (*)    Creatinine, Ser 1.40 (*)    Calcium 8.7 (*)    GFR calc non Af Amer 45 (*)    GFR calc Af Amer 52 (*)    All other components within normal limits  URINALYSIS, ROUTINE W REFLEX MICROSCOPIC (NOT AT [facility]) - Abnormal; Notable for the following:    Color, Urine ORANGE (*)    APPearance CLOUDY (*)    Bilirubin Urine SMALL (*)    Ketones, ur 15 (*)    Leukocytes, UA SMALL (*)    All other components within normal limits  URINE MICROSCOPIC-ADD ON - Abnormal; Notable for the following:    Casts HYALINE CASTS (*)    All other components within normal limits  URINE CULTURE  I-STAT CG4 LACTIC ACID, ED    Imaging Review Dg Chest 2 View  04/03/2015   CLINICAL DATA:  Pt states that he has been "freezing" for the past couple of days and that he just isnt able to get his body to warm up. States that his family MD called today and told him to come to ER because his WBC was very high. Hx aortic procedure at the beginning of this year - has had voice problems since. Recent round of antibiotics due to laceration on head. Htn. Pacemaker. No other chest complaints per pt.  EXAM: CHEST  2  VIEW  COMPARISON:  05/19/2014  FINDINGS: There changes from cardiac surgery and valve replacement. Cardiac silhouette is mildly  enlarged. There is a large hiatal hernia, stable. No other mediastinal abnormality. No hilar mass or evidence of adenopathy.  Lungs are hyperexpanded. There is no lung consolidation or edema. Mild left lung base reticular opacities likely atelectasis.  Left anterior chest wall sequential pacemaker is stable and well positioned.  Bony thorax is demineralized. There is an old fracture of the left midclavicle.  IMPRESSION: No acute cardiopulmonary disease.   Electronically Signed   By: Lajean Manes M.D.   On: 04/03/2015 20:07     EKG Interpretation None      MDM   Final diagnoses:  Leukocytosis  UTI (lower urinary tract infection)    Patient here for evaluation of chills at home, leukocytosis on outpatient lab studies. Patient is nontoxic appearing on examination. There is no evidence of pneumonia. Urinalysis with possible early urinary tract infection. Patient is mildly dehydrated with slight elevation in his creatinine from baseline, Provided IV fluids and Rocephin for possible UTI. Plan to DC home with very close return precautions for recurrent symptoms. Patient is not septic by history and examination.    [name] Reichert, MD 04/03/15 2352

## 2015-04-03 NOTE — Telephone Encounter (Signed)
Called about elevated WBC.  D/w Dr. Loanne Drilling. He advised ER.  I called pt.  Advised ER.  He'll go to ER.

## 2015-04-03 NOTE — ED Notes (Signed)
Pt tates that he was told by his pcp to come to the ED for elevated WBC. Pt denies any recent illness. Pt reports having an episode of "chills" during the night. Pt had recent removal of skin cancer on his head. Site appears to be healing well with stiches in place.

## 2015-04-05 LAB — URINE CULTURE

## 2015-04-07 ENCOUNTER — Telehealth: Payer: Self-pay | Admitting: Endocrinology

## 2015-04-07 ENCOUNTER — Emergency Department (HOSPITAL_COMMUNITY)
Admission: EM | Admit: 2015-04-07 | Discharge: 2015-04-07 | Disposition: A | Discharge status: Home / Self Care | Payer: Medicare Other | Attending: Emergency Medicine | Admitting: Emergency Medicine

## 2015-04-07 ENCOUNTER — Encounter (HOSPITAL_COMMUNITY): Payer: Self-pay | Admitting: Emergency Medicine

## 2015-04-07 DIAGNOSIS — Z7902 Long term (current) use of antithrombotics/antiplatelets: Secondary | ICD-10-CM | POA: Diagnosis not present

## 2015-04-07 DIAGNOSIS — Y9389 Activity, other specified: Secondary | ICD-10-CM | POA: Insufficient documentation

## 2015-04-07 DIAGNOSIS — Z8673 Personal history of transient ischemic attack (TIA), and cerebral infarction without residual deficits: Secondary | ICD-10-CM | POA: Insufficient documentation

## 2015-04-07 DIAGNOSIS — E785 Hyperlipidemia, unspecified: Secondary | ICD-10-CM | POA: Diagnosis not present

## 2015-04-07 DIAGNOSIS — S81811A Laceration without foreign body, right lower leg, initial encounter: Secondary | ICD-10-CM | POA: Diagnosis not present

## 2015-04-07 DIAGNOSIS — D509 Iron deficiency anemia, unspecified: Secondary | ICD-10-CM | POA: Diagnosis not present

## 2015-04-07 DIAGNOSIS — Z79899 Other long term (current) drug therapy: Secondary | ICD-10-CM | POA: Diagnosis not present

## 2015-04-07 DIAGNOSIS — Z85828 Personal history of other malignant neoplasm of skin: Secondary | ICD-10-CM | POA: Insufficient documentation

## 2015-04-07 DIAGNOSIS — Z9889 Other specified postprocedural states: Secondary | ICD-10-CM | POA: Diagnosis not present

## 2015-04-07 DIAGNOSIS — I4891 Unspecified atrial fibrillation: Secondary | ICD-10-CM | POA: Insufficient documentation

## 2015-04-07 DIAGNOSIS — Z8669 Personal history of other diseases of the nervous system and sense organs: Secondary | ICD-10-CM | POA: Diagnosis not present

## 2015-04-07 DIAGNOSIS — E0590 Thyrotoxicosis, unspecified without thyrotoxic crisis or storm: Secondary | ICD-10-CM | POA: Diagnosis not present

## 2015-04-07 DIAGNOSIS — Z8659 Personal history of other mental and behavioral disorders: Secondary | ICD-10-CM | POA: Insufficient documentation

## 2015-04-07 DIAGNOSIS — Z8709 Personal history of other diseases of the respiratory system: Secondary | ICD-10-CM | POA: Diagnosis not present

## 2015-04-07 DIAGNOSIS — I10 Essential (primary) hypertension: Secondary | ICD-10-CM | POA: Diagnosis not present

## 2015-04-07 DIAGNOSIS — Y998 Other external cause status: Secondary | ICD-10-CM | POA: Diagnosis not present

## 2015-04-07 DIAGNOSIS — K219 Gastro-esophageal reflux disease without esophagitis: Secondary | ICD-10-CM | POA: Insufficient documentation

## 2015-04-07 DIAGNOSIS — Y9289 Other specified places as the place of occurrence of the external cause: Secondary | ICD-10-CM | POA: Insufficient documentation

## 2015-04-07 DIAGNOSIS — Z792 Long term (current) use of antibiotics: Secondary | ICD-10-CM | POA: Insufficient documentation

## 2015-04-07 DIAGNOSIS — Z87891 Personal history of nicotine dependence: Secondary | ICD-10-CM | POA: Diagnosis not present

## 2015-04-07 DIAGNOSIS — Z86010 Personal history of colonic polyps: Secondary | ICD-10-CM | POA: Diagnosis not present

## 2015-04-07 DIAGNOSIS — H269 Unspecified cataract: Secondary | ICD-10-CM | POA: Diagnosis not present

## 2015-04-07 DIAGNOSIS — Z951 Presence of aortocoronary bypass graft: Secondary | ICD-10-CM | POA: Diagnosis not present

## 2015-04-07 DIAGNOSIS — W2209XA Striking against other stationary object, initial encounter: Secondary | ICD-10-CM | POA: Diagnosis not present

## 2015-04-07 DIAGNOSIS — Z954 Presence of other heart-valve replacement: Secondary | ICD-10-CM | POA: Insufficient documentation

## 2015-04-07 DIAGNOSIS — Z950 Presence of cardiac pacemaker: Secondary | ICD-10-CM | POA: Insufficient documentation

## 2015-04-07 DIAGNOSIS — I2510 Atherosclerotic heart disease of native coronary artery without angina pectoris: Secondary | ICD-10-CM | POA: Diagnosis not present

## 2015-04-07 DIAGNOSIS — Z8546 Personal history of malignant neoplasm of prostate: Secondary | ICD-10-CM | POA: Diagnosis not present

## 2015-04-07 DIAGNOSIS — Z953 Presence of xenogenic heart valve: Secondary | ICD-10-CM | POA: Insufficient documentation

## 2015-04-07 MED ORDER — LIDOCAINE HCL (PF) 1 % IJ SOLN
10.0000 mL | Freq: Once | INTRAMUSCULAR | Status: AC
  Administered 2015-04-07: 10 mL
  Filled 2015-04-07: qty 10

## 2015-04-07 NOTE — ED Provider Notes (Signed)
CSN: [id number]     Arrival date & time 04/07/15  1737 History  This chart was scribed for [name] Quill NP, working with Noemi Chapel, MD by Rayna Sexton, ED Scribe. This patient was seen in room TR02C/TR02C and the patient's care was started at 6:25 PM.     Chief Complaint  Patient presents with  . Extremity Laceration    The history is provided by the patient. No language interpreter was used.    HPI Comments: [name] [name] is a 81 y.o. male who presents to the Emergency Department complaining of a laceration, with controlled bleeding, to the right lower leg after hitting the corner of the car door earlier today. Pt confirms being UTD on tetanus. Pt notes living at home with his wife. Pt denies any allergies. He also denies numbness.    Past Medical History  Diagnosis Date  . GASTRIC POLYP 05/13/2009  . COLONIC POLYPS, ADENOMATOUS 01/14/2008  . HYPERLIPIDEMIA 06/01/2007  . ANEMIA, IRON DEFICIENCY 10/13/2008  . COAGULOPATHY, COUMADIN-INDUCED 01/14/2008  . ANXIETY 09/04/2008  . HYPERTENSION 06/01/2007  . ATRIAL FIBRILLATION, CHRONIC 01/14/2008    on warfarin since 2007  . UNSPECIFIED VENOUS INSUFFICIENCY 09/18/2008  . PLEURAL EFFUSION, LEFT 10/13/2008  . GERD 06/01/2007  . BARRETTS ESOPHAGUS 03/20/2009  . PROSTATE CANCER, HX OF 01/14/2008  . CATARACTS, BILATERAL, HX OF 01/14/2008  . CEREBROVASCULAR ACCIDENT, HX OF 08/18/2008  . PERSONAL HX COLONIC POLYPS 01/01/2010  . BASAL CELL CARCINOMA OF SKIN SITE UNSPECIFIED 09/28/2010  . Hyperglycemia   . Elevated PSA   . Restless leg syndrome   . Mitral regurgitation 06/13/2008    mitral valve replacement #20 St. Jude Biocor; Maze procedure by Dr. Amador Cunas  at [facility]  . CORONARY ARTERY DISEASE 06/01/2007    Cath 04/03/2008; Cath 11/01/1990  . Tachy-brady syndrome 07/13/2008    medtronic Adapta gen. model ADDR01 ,serial # [id number] H by Dr  [name] at [facility]  . Peripheral artery disease 06/16/2010    LEA doppler-left ABI nml 0.61 calcified  vessels;right ABI  0.68  . Edema, peripheral 07/30/2009    LEV doppler- no thrombus or thrombophlebitis  . Aortic valvular disorder 08/16/2012    echo EF 45-50% LV mildly reduce,Biocor mitral valve,mild to mod. tricuspid regrug.,mild to mod. aortic regrug.  . Mitral valve disorder 09/08/2011    echo EF 50-55% LV normal  . Aortic valvular stenosis 11/19/2013  . Cardiomyopathy, ischemic 11/19/2013    EF 45-50% with inferior wall hypokinesis by echo 2014  . S/P CABG x 1 06/13/2008    SVG to LAD with open vein harvest right thigh, LIMA harvested but not utilized by Dr Amador Cunas  . S/P mitral valve replacement with bioprosthetic valve 06/13/2008    42mm St Jude Biocor porcine bioprosthetic tissue valve by Dr Amador Cunas  . S/P Maze operation for atrial fibrillation 06/13/2008    Partial left atrial lesion set using cryothermy for bilateral pulmonary vein isolation by Dr Amador Cunas  . Prosthetic valve dysfunction     Mitral stenosis involving bioprosthetic tissue valve placed 06/13/2008  . Mitral stenosis     Porcine bioprosthetic tissue valve placed 06/13/2008  . Dysrhythmia     HX OF TACHY BRADY  . Pacemaker   . HYPERTHYROIDISM 01/06/2011  . 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 Prostatectomy    . Tonsillectomy    .  Cystectomy      Left leg  . Pacemaker placement  07/13/2008    Adapata dual chamber-Medtronic at [facility]   . Coronary artery bypass graft  06/13/2008    graft x1 w/vein graft LAD artery by Dr. Amador Cunas at [facility]  . Mitral valve replacement  06/13/2008    #20 St. Jude Bicor mitral valve ;Maze procedure at [facility] by Dr. Amador Cunas  . Cardiac catheterization  11/01/1990  . Skin cancer excision      removed from forehead and right leg, nose/face  . Upper gastrointestinal endoscopy  07/14/2009    erosive reflux esopagitis, Barrett's  esophagus  . Colonoscopy  07/14/2009    diverticulosis, internal hemorrhoids  . Cardiovascular stress test  06/03/2010    Persantine perfusion EF 45% mild to moderate ischemia basal and mid inferolateral region  . Cardiovascular stress test  01/01/2008    left ventricle normal,no significant ischemia  . Cardiovascular stress test  09/09/2005     possibility of ischemia inferior wall  . Tee without cardioversion N/A 01/22/2014    Procedure: TRANSESOPHAGEAL ECHOCARDIOGRAM (TEE);  Surgeon: Sanda Klein, MD;  Location: [facility] ENDOSCOPY;  Service: Cardiovascular;  Laterality: N/A;  . Coronary stent placement  03/24/2014    DES to LAD  . Transcatheter aortic valve replacement, transfemoral N/A 04/22/2014    Procedure: TRANSCATHETER AORTIC VALVE REPLACEMENT, TRANSFEMORAL;  Surgeon: [name], MD;  Location: [place];  Service: Open Heart Surgery;  Laterality: N/A;  TAVR-TF (RIGHT SIDE)  . Intraoperative transesophageal echocardiogram N/A 04/22/2014    Procedure: INTRAOPERATIVE TRANSTHORACIC ECHOCARDIOGRAM;  Surgeon: [name], MD;  Location: [place];  Service: Open Heart Surgery;  Laterality: N/A;  . Left and right heart catheterization with coronary angiogram N/A 01/08/2014    Procedure: LEFT AND RIGHT HEART CATHETERIZATION WITH CORONARY ANGIOGRAM;  Surgeon: Sanda Klein, MD;  Location: [place] CATH LAB;  Service: Cardiovascular;  Laterality: N/A;  . Percutaneous coronary rotoblator intervention (pci-r) N/A 03/24/2014    Procedure: PERCUTANEOUS CORONARY ROTOBLATOR INTERVENTION (PCI-R);  Surgeon: Blane Ohara, MD;  Location: [facility] CATH LAB;  Service: Cardiovascular;  Laterality: N/A;   Family History  Problem Relation Age of Onset  . Breast cancer Mother    History  Substance Use Topics  . Smoking status: Former Smoker -- 2 years    Types: Pipe    Quit date: 11/07/1965  . Smokeless tobacco: Never Used     Comment: quit over 40 years ago, never smoked cigarettes  . Alcohol Use: No    Review of  Systems  Skin: Positive for wound.  Neurological: Negative for numbness.  All other systems reviewed and are negative.     Allergies  Review of patient's allergies indicates no known allergies.  Home Medications   Prior to Admission medications   Medication Sig Start Date End Date Taking? Authorizing Provider  amiodarone (PACERONE) 200 MG tablet Take 1 tablet (200 mg total) by mouth daily. Patient taking differently: Take 100 mg by mouth daily.  05/12/14   [name] [name], MD  cephALEXin (KEFLEX) 500 MG capsule Take 1 capsule (500 mg total) by mouth 2 (two) times daily. 04/03/15   Quintella Reichert, MD  clopidogrel (PLAVIX) 75 MG tablet Take 1 tablet (75 mg total) by mouth daily with breakfast. 12/10/14   Sanda Klein, MD  ferrous sulfate 325 (65 FE) MG tablet Take 325 mg by mouth daily with breakfast.    Historical Provider, MD  methimazole (TAPAZOLE) 5 MG tablet TAKE 1 TABLET (5 MG TOTAL) BY MOUTH EVERY  MONDAY, WEDNESDAY, AND FRIDAY. 03/31/15   [name], MD  metoprolol succinate (TOPROL-XL) 50 MG 24 hr tablet TAKE 1 TABLET (50 MG TOTAL) BY MOUTH DAILY. TAKE WITH OR IMMEDIATELY FOLLOWING A MEAL. 02/23/15   [name] [name], MD  Multiple Vitamin (MULTIVITAMIN WITH MINERALS) TABS Take 1 tablet by mouth daily. Centrum Silver    Historical Provider, MD  pantoprazole (PROTONIX) 40 MG tablet Take 1 tablet (40 mg total) by mouth 2 (two) times daily. 12/30/14   Gatha Mayer, MD  simvastatin (ZOCOR) 40 MG tablet Take 1 tablet (40 mg total) by mouth at bedtime. 10/24/14   [name], MD  Tamsulosin HCl (FLOMAX) 0.4 MG CAPS Take 0.4 mg by mouth every evening.     Historical Provider, MD   BP 125/72 mmHg  Pulse 69  Temp(Src) 98.5 F (36.9 C) (Oral)  Resp 22  SpO2 99% Physical Exam  Constitutional: He is oriented to person, place, and time. He appears well-developed and well-nourished. No distress.  HENT:  Head: Normocephalic and atraumatic.  Eyes: Conjunctivae and EOM are normal.  Neck: Neck  supple.  Cardiovascular: Normal rate.   Pulmonary/Chest: Breath sounds normal. No respiratory distress.  Neurological: He is alert and oriented to person, place, and time.  Skin: Skin is warm and dry.     6 cm laceration to lateral lower right leg  Psychiatric: He has a normal mood and affect. His behavior is normal.  Nursing note and vitals reviewed.     ED Course  Procedures  DIAGNOSTIC STUDIES: Oxygen Saturation is 99% on RA, normal by my interpretation.    COORDINATION OF CARE: 6:27 PM Discussed treatment plan with pt at bedside including sutures and pt agreed to plan.  LACERATION REPAIR PROCEDURE NOTE The patient's identification was confirmed and consent was obtained. This procedure was performed by Noemi Chapel, MD at 7:03 PM. Site: Lateral lower right leg Sterile procedures observed Anesthetic used (type and amt): lidocaine 1% without epinephrine  Suture type/size:4-0 proline Length: 6 cm # of Sutures: 11 Technique: simple interrupted   Tetanus UTD  Site anesthetized, irrigated with NS, explored without evidence of foreign body, wound well approximated, site covered with dry, sterile dressing.  Patient tolerated procedure well without complications. Instructions for care discussed verbally and patient provided with additional written instructions for homecare and f/u.   Labs Review Labs Reviewed - No data to display  Imaging Review No results found.   EKG Interpretation None     Compression dressing applied to sutured wound. Wound care instructions provided. Suture removal in 7-10 days with PCP. Return precautions discussed. Monitor for signs of infection. MDM   Final diagnoses:  None    Laceration right lower leg.  I personally performed the services described in this documentation, which was scribed in my presence. The recorded information has been reviewed and is accurate.    [name] Quill, NP 04/07/15 4627  Noemi Chapel, MD 04/08/15  [phone]

## 2015-04-07 NOTE — ED Notes (Signed)
Pt st's he cut his right lower leg on a car door.  Pt has lg laceration on lower leg.  Bleeding controlled.

## 2015-04-07 NOTE — Discharge Instructions (Signed)

## 2015-04-07 NOTE — Telephone Encounter (Signed)
[facility] [employer] noted dated 04/03/15 5:14 pm  Caller states she is Jeani Hawking from [employer] at [place] and she has a critical lab [employer] for [name] [name] dob 10-22-1929 pt of Dr. Loanne Drilling CB# [phone]  On call provider paged

## 2015-04-07 NOTE — ED Provider Notes (Signed)
81 year old male presents after sustaining a laceration to his right lower leg after striking his leg against a metal bar. This was repaired successfully without any difficulty, he has neurovascular status is normal distal to the wound, he is in no distress, tetanus is updated, he is on Plavix, he was warned about wound care and ongoing bleeding. He is stable for discharge.  Medical screening examination/treatment/procedure(s) were conducted as a shared visit with non-physician practitioner(s) and myself.  I personally evaluated the patient during the encounter.  Clinical Impression:   Final diagnoses:  Laceration of right lower leg, initial encounter         Noemi Chapel, MD 04/08/15 [phone]

## 2015-04-07 NOTE — ED Notes (Signed)
Pt with large laceration to RLE. Area actively bleeding- bandaged in triage. Pt is on a blood thinner. Pt hit leg on door of car causing laceration.

## 2015-04-10 ENCOUNTER — Encounter: Payer: Medicare Other | Admitting: Cardiovascular Disease

## 2015-04-16 ENCOUNTER — Encounter (HOSPITAL_COMMUNITY): Payer: Self-pay | Admitting: Cardiology

## 2015-04-16 ENCOUNTER — Emergency Department (HOSPITAL_COMMUNITY)
Admission: EM | Admit: 2015-04-16 | Discharge: 2015-04-16 | Disposition: A | Discharge status: Home / Self Care | Payer: Medicare Other | Attending: Emergency Medicine | Admitting: Emergency Medicine

## 2015-04-16 ENCOUNTER — Encounter: Payer: Self-pay | Admitting: *Deleted

## 2015-04-16 ENCOUNTER — Emergency Department (HOSPITAL_BASED_OUTPATIENT_CLINIC_OR_DEPARTMENT_OTHER): Payer: Medicare Other

## 2015-04-16 ENCOUNTER — Emergency Department (INDEPENDENT_AMBULATORY_CARE_PROVIDER_SITE_OTHER)
Admission: EM | Admit: 2015-04-16 | Discharge: 2015-04-16 | Disposition: A | Discharge status: Nursing Home (Includes ICF) | Payer: Medicare Other | Source: Home / Self Care | Attending: Family Medicine | Admitting: Family Medicine

## 2015-04-16 DIAGNOSIS — I2510 Atherosclerotic heart disease of native coronary artery without angina pectoris: Secondary | ICD-10-CM | POA: Insufficient documentation

## 2015-04-16 DIAGNOSIS — Z8546 Personal history of malignant neoplasm of prostate: Secondary | ICD-10-CM | POA: Diagnosis not present

## 2015-04-16 DIAGNOSIS — E0590 Thyrotoxicosis, unspecified without thyrotoxic crisis or storm: Secondary | ICD-10-CM | POA: Insufficient documentation

## 2015-04-16 DIAGNOSIS — L03115 Cellulitis of right lower limb: Secondary | ICD-10-CM | POA: Insufficient documentation

## 2015-04-16 DIAGNOSIS — Z87891 Personal history of nicotine dependence: Secondary | ICD-10-CM | POA: Insufficient documentation

## 2015-04-16 DIAGNOSIS — M7989 Other specified soft tissue disorders: Secondary | ICD-10-CM | POA: Diagnosis not present

## 2015-04-16 DIAGNOSIS — I10 Essential (primary) hypertension: Secondary | ICD-10-CM | POA: Diagnosis not present

## 2015-04-16 DIAGNOSIS — M79609 Pain in unspecified limb: Secondary | ICD-10-CM

## 2015-04-16 DIAGNOSIS — E785 Hyperlipidemia, unspecified: Secondary | ICD-10-CM | POA: Diagnosis not present

## 2015-04-16 DIAGNOSIS — Z8709 Personal history of other diseases of the respiratory system: Secondary | ICD-10-CM | POA: Diagnosis not present

## 2015-04-16 DIAGNOSIS — Z792 Long term (current) use of antibiotics: Secondary | ICD-10-CM | POA: Insufficient documentation

## 2015-04-16 DIAGNOSIS — Z951 Presence of aortocoronary bypass graft: Secondary | ICD-10-CM | POA: Diagnosis not present

## 2015-04-16 DIAGNOSIS — I4891 Unspecified atrial fibrillation: Secondary | ICD-10-CM | POA: Diagnosis not present

## 2015-04-16 DIAGNOSIS — Z85828 Personal history of other malignant neoplasm of skin: Secondary | ICD-10-CM | POA: Diagnosis not present

## 2015-04-16 DIAGNOSIS — D649 Anemia, unspecified: Secondary | ICD-10-CM | POA: Diagnosis not present

## 2015-04-16 DIAGNOSIS — Z86010 Personal history of colonic polyps: Secondary | ICD-10-CM | POA: Insufficient documentation

## 2015-04-16 DIAGNOSIS — Z950 Presence of cardiac pacemaker: Secondary | ICD-10-CM | POA: Insufficient documentation

## 2015-04-16 DIAGNOSIS — Z8673 Personal history of transient ischemic attack (TIA), and cerebral infarction without residual deficits: Secondary | ICD-10-CM | POA: Insufficient documentation

## 2015-04-16 DIAGNOSIS — Q232 Congenital mitral stenosis: Secondary | ICD-10-CM | POA: Insufficient documentation

## 2015-04-16 DIAGNOSIS — M79604 Pain in right leg: Secondary | ICD-10-CM | POA: Diagnosis present

## 2015-04-16 DIAGNOSIS — Z9889 Other specified postprocedural states: Secondary | ICD-10-CM | POA: Insufficient documentation

## 2015-04-16 DIAGNOSIS — Z79899 Other long term (current) drug therapy: Secondary | ICD-10-CM | POA: Insufficient documentation

## 2015-04-16 DIAGNOSIS — K219 Gastro-esophageal reflux disease without esophagitis: Secondary | ICD-10-CM | POA: Diagnosis not present

## 2015-04-16 DIAGNOSIS — Z952 Presence of prosthetic heart valve: Secondary | ICD-10-CM | POA: Diagnosis not present

## 2015-04-16 LAB — BASIC METABOLIC PANEL
Anion gap: 9 (ref 5–15)
BUN: 17 mg/dL (ref 6–20)
CALCIUM: 8.6 mg/dL — AB (ref 8.9–10.3)
CO2: 25 mmol/L (ref 22–32)
CREATININE: 0.98 mg/dL (ref 0.61–1.24)
Chloride: 106 mmol/L (ref 101–111)
GFR calc Af Amer: >60 mL/min (ref >60)
Glucose, Bld: 104 mg/dL — ABNORMAL HIGH (ref 65–99)
Potassium: 4.1 mmol/L (ref 3.5–5.1)
Sodium: 140 mmol/L (ref 135–145)

## 2015-04-16 LAB — CBC
HCT: 36.2 % — ABNORMAL LOW (ref 39.0–52.0)
Hemoglobin: 11.9 g/dL — ABNORMAL LOW (ref 13.0–17.0)
MCH: 30.6 pg (ref 26.0–34.0)
MCHC: 32.9 g/dL (ref 30.0–36.0)
MCV: 93.1 fL (ref 78.0–100.0)
Platelets: 172 10*3/uL (ref 150–400)
RBC: 3.89 MIL/uL — ABNORMAL LOW (ref 4.22–5.81)
RDW: 13.6 % (ref 11.5–15.5)
WBC: 6.5 10*3/uL (ref 4.0–10.5)

## 2015-04-16 MED ORDER — DOXYCYCLINE HYCLATE 100 MG PO CAPS: 100.0000 mg | ORAL_CAPSULE | Freq: Two times a day (BID) | ORAL | Status: DC

## 2015-04-16 MED ORDER — CEFTRIAXONE SODIUM 1 G IJ SOLR
1.0000 g | Freq: Once | INTRAMUSCULAR | Status: AC
  Administered 2015-04-16: 1 g via INTRAMUSCULAR
  Filled 2015-04-16: qty 10

## 2015-04-16 MED ORDER — LIDOCAINE HCL (PF) 1 % IJ SOLN
2.0000 mL | Freq: Once | INTRAMUSCULAR | Status: DC
  Administered 2015-04-16: 2 mL

## 2015-04-16 NOTE — ED Notes (Signed)
Pt reports he was seen here a couple of days ago after he cut his right leg on the car door. States he has been on antibiotics but the leg is more swollen.

## 2015-04-16 NOTE — ED Notes (Signed)
Pt declined shuttle... Left by private vehicle.

## 2015-04-16 NOTE — ED Provider Notes (Signed)
[name] [name] is a 81 y.o. male who presents to Urgent Care today for right leg swelling. Patient suffered a laceration to his right lower leg on May 31. This is repaired with sutures. He noticed significant leg swelling about 2 days ago. He notes that his leg is tender as well. He has been taking his anti-biotics. No fevers or chills.. No vomiting or diarrhea. He is here today as well for suture removal.   Past Medical History  Diagnosis Date  . GASTRIC POLYP 05/13/2009  . COLONIC POLYPS, ADENOMATOUS 01/14/2008  . HYPERLIPIDEMIA 06/01/2007  . ANEMIA, IRON DEFICIENCY 10/13/2008  . COAGULOPATHY, COUMADIN-INDUCED 01/14/2008  . ANXIETY 09/04/2008  . HYPERTENSION 06/01/2007  . ATRIAL FIBRILLATION, CHRONIC 01/14/2008    on warfarin since 2007  . UNSPECIFIED VENOUS INSUFFICIENCY 09/18/2008  . PLEURAL EFFUSION, LEFT 10/13/2008  . GERD 06/01/2007  . BARRETTS ESOPHAGUS 03/20/2009  . PROSTATE CANCER, HX OF 01/14/2008  . CATARACTS, BILATERAL, HX OF 01/14/2008  . CEREBROVASCULAR ACCIDENT, HX OF 08/18/2008  . PERSONAL HX COLONIC POLYPS 01/01/2010  . BASAL CELL CARCINOMA OF SKIN SITE UNSPECIFIED 09/28/2010  . Hyperglycemia   . Elevated PSA   . Restless leg syndrome   . Mitral regurgitation 06/13/2008    mitral valve replacement #20 St. Jude Biocor; Maze procedure by Dr. Amador Cunas  at [facility]  . CORONARY ARTERY DISEASE 06/01/2007    Cath 04/03/2008; Cath 11/01/1990  . Tachy-brady syndrome 07/13/2008    medtronic Adapta gen. model ADDR01 ,serial # [id number] H by Dr  [name] at [facility]  . Peripheral artery disease 06/16/2010    LEA doppler-left ABI nml 0.61 calcified vessels;right ABI  0.68  . Edema, peripheral 07/30/2009    LEV doppler- no thrombus or thrombophlebitis  . Aortic valvular disorder 08/16/2012    echo EF 45-50% LV mildly reduce,Biocor mitral valve,mild to mod. tricuspid regrug.,mild to mod. aortic regrug.  . Mitral valve disorder 09/08/2011    echo EF 50-55% LV normal  . Aortic valvular stenosis  11/19/2013  . Cardiomyopathy, ischemic 11/19/2013    EF 45-50% with inferior wall hypokinesis by echo 2014  . S/P CABG x 1 06/13/2008    SVG to LAD with open vein harvest right thigh, LIMA harvested but not utilized by Dr Amador Cunas  . S/P mitral valve replacement with bioprosthetic valve 06/13/2008    26mm St Jude Biocor porcine bioprosthetic tissue valve by Dr Amador Cunas  . S/P Maze operation for atrial fibrillation 06/13/2008    Partial left atrial lesion set using cryothermy for bilateral pulmonary vein isolation by Dr Amador Cunas  . Prosthetic valve dysfunction     Mitral stenosis involving bioprosthetic tissue valve placed 06/13/2008  . Mitral stenosis     Porcine bioprosthetic tissue valve placed 06/13/2008  . Dysrhythmia     HX OF TACHY BRADY  . Pacemaker   . HYPERTHYROIDISM 01/06/2011  . 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 Prostatectomy    . Tonsillectomy    . Cystectomy      Left leg  . Pacemaker placement  07/13/2008    Adapata dual chamber-Medtronic at [employer]   . Coronary artery bypass graft  06/13/2008    graft x1 w/vein graft LAD artery by Dr. Amador Cunas at [facility]  . Mitral valve replacement  06/13/2008    #20 St. Jude Bicor mitral valve ;Maze procedure  at [place] by Dr. Amador Cunas  . Cardiac catheterization  11/01/1990  . Skin cancer excision      removed from forehead and right leg, nose/face  . Upper gastrointestinal endoscopy  07/14/2009    erosive reflux esopagitis, Barrett's esophagus  . Colonoscopy  07/14/2009    diverticulosis, internal hemorrhoids  . Cardiovascular stress test  06/03/2010    Persantine perfusion EF 45% mild to moderate ischemia basal and mid inferolateral region  . Cardiovascular stress test  01/01/2008    left ventricle normal,no significant ischemia  . Cardiovascular stress test  09/09/2005      possibility of ischemia inferior wall  . Tee without cardioversion N/A 01/22/2014    Procedure: TRANSESOPHAGEAL ECHOCARDIOGRAM (TEE);  Surgeon: Sanda Klein, MD;  Location: [facility] ENDOSCOPY;  Service: Cardiovascular;  Laterality: N/A;  . Coronary stent placement  03/24/2014    DES to LAD  . Transcatheter aortic valve replacement, transfemoral N/A 04/22/2014    Procedure: TRANSCATHETER AORTIC VALVE REPLACEMENT, TRANSFEMORAL;  Surgeon: [name], MD;  Location: [place];  Service: Open Heart Surgery;  Laterality: N/A;  TAVR-TF (RIGHT SIDE)  . Intraoperative transesophageal echocardiogram N/A 04/22/2014    Procedure: INTRAOPERATIVE TRANSTHORACIC ECHOCARDIOGRAM;  Surgeon: [name], MD;  Location: [place];  Service: Open Heart Surgery;  Laterality: N/A;  . Left and right heart catheterization with coronary angiogram N/A 01/08/2014    Procedure: LEFT AND RIGHT HEART CATHETERIZATION WITH CORONARY ANGIOGRAM;  Surgeon: Sanda Klein, MD;  Location: [place] CATH LAB;  Service: Cardiovascular;  Laterality: N/A;  . Percutaneous coronary rotoblator intervention (pci-r) N/A 03/24/2014    Procedure: PERCUTANEOUS CORONARY ROTOBLATOR INTERVENTION (PCI-R);  Surgeon: Blane Ohara, MD;  Location: [facility] CATH LAB;  Service: Cardiovascular;  Laterality: N/A;   History  Substance Use Topics  . Smoking status: Former Smoker -- 2 years    Types: Pipe    Quit date: 11/07/1965  . Smokeless tobacco: Never Used     Comment: quit over 40 years ago, never smoked cigarettes  . Alcohol Use: No   ROS as above Medications: No current facility-administered medications for this encounter.   Current Outpatient Prescriptions  Medication Sig Dispense Refill  . amiodarone (PACERONE) 200 MG tablet Take 1 tablet (200 mg total) by mouth daily. (Patient taking differently: Take 100 mg by mouth daily. ) 90 tablet 3  . clopidogrel (PLAVIX) 75 MG tablet Take 1 tablet (75 mg total) by mouth daily with breakfast. 90 tablet 3  . methimazole  (TAPAZOLE) 5 MG tablet TAKE 1 TABLET (5 MG TOTAL) BY MOUTH EVERY MONDAY, WEDNESDAY, AND FRIDAY. 36 tablet 2  . metoprolol succinate (TOPROL-XL) 50 MG 24 hr tablet TAKE 1 TABLET (50 MG TOTAL) BY MOUTH DAILY. TAKE WITH OR IMMEDIATELY FOLLOWING A MEAL. 90 tablet 3  . pantoprazole (PROTONIX) 40 MG tablet Take 1 tablet (40 mg total) by mouth 2 (two) times daily. 180 tablet 3  . simvastatin (ZOCOR) 40 MG tablet Take 1 tablet (40 mg total) by mouth at bedtime. 90 tablet 1  . cephALEXin (KEFLEX) 500 MG capsule Take 1 capsule (500 mg total) by mouth 2 (two) times daily. 28 capsule 0  . ferrous sulfate 325 (65 FE) MG tablet Take 325 mg by mouth daily with breakfast.    . Multiple Vitamin (MULTIVITAMIN WITH MINERALS) TABS Take 1 tablet by mouth daily. Centrum Silver    . Tamsulosin HCl (FLOMAX) 0.4 MG CAPS Take 0.4 mg by mouth every evening.      No Known Allergies   Exam:  BP 142/80 mmHg  Pulse 69  Temp(Src) 98 F (36.7 C) (Oral)  Resp 16  SpO2 95% Gen: Well NAD HEENT: EOMI,  MMM Right leg erythematous tender and swollen. Well-appearing laceration. 11 sutures removed. Right leg is larger than left  No results found for this or any previous visit (from the past 24 hour(s)). No results found.  Assessment and Plan: 81 y.o. male with suture removal and right leg swelling. Concerning for DVT versus cellulitis after failing oral antibodies. Transfer to emergency department for further evaluation and management.  Discussed warning signs or symptoms. Please see discharge instructions. Patient expresses understanding.     Gregor Hams, MD 04/16/15 [phone]

## 2015-04-16 NOTE — Progress Notes (Signed)
VASCULAR LAB PRELIMINARY  PRELIMINARY  PRELIMINARY  PRELIMINARY  Right lower extremity venous duplex completed.    Preliminary report:  Right:  No evidence of DVT, superficial thrombosis, or Baker's cyst.  Elspeth Blucher, RVs 04/16/2015, 7:16 PM

## 2015-04-16 NOTE — Discharge Instructions (Signed)
Cellulitis Return for fever, increased swelling or tenderness of the right lower leg, drainage from the site of the wound. Take anabiotic's as prescribed. Rest, elevation, apply warm compresses. Cellulitis is an infection of the skin and the tissue beneath it. The infected area is usually red and tender. Cellulitis occurs most often in the arms and lower legs.  CAUSES  Cellulitis is caused by bacteria that enter the skin through cracks or cuts in the skin. The most common types of bacteria that cause cellulitis are staphylococci and streptococci. SIGNS AND SYMPTOMS   Redness and warmth.  Swelling.  Tenderness or pain.  Fever. DIAGNOSIS  Your health care provider can usually determine what is wrong based on a physical exam. Blood tests may also be done. TREATMENT  Treatment usually involves taking an antibiotic medicine. HOME CARE INSTRUCTIONS   Take your antibiotic medicine as directed by your health care provider. Finish the antibiotic even if you start to feel better.  Keep the infected arm or leg elevated to reduce swelling.  Apply a warm cloth to the affected area up to 4 times per day to relieve pain.  Take medicines only as directed by your health care provider.  Keep all follow-up visits as directed by your health care provider. SEEK MEDICAL CARE IF:   You notice red streaks coming from the infected area.  Your red area gets larger or turns dark in color.  Your bone or joint underneath the infected area becomes painful after the skin has healed.  Your infection returns in the same area or another area.  You notice a swollen bump in the infected area.  You develop new symptoms.  You have a fever. SEEK IMMEDIATE MEDICAL CARE IF:   You feel very sleepy.  You develop vomiting or diarrhea.  You have a general ill feeling (malaise) with muscle aches and pains. MAKE SURE YOU:   Understand these instructions.  Will watch your condition.  Will get help right  away if you are not doing well or get worse. Document Released: 08/03/2005 Document Revised: 03/10/2014 Document Reviewed: 01/09/2012 [facility] Patient Information 2015 [place], [place]. This information is not intended to replace advice given to you by your health care provider. Make sure you discuss any questions you have with your health care provider.

## 2015-04-16 NOTE — ED Notes (Signed)
Pt c/o LLE swelling and edema; was recently treated for a laceration to lower leg and has been taking antibiotics. Pt reports increased swelling with notable weeping to area

## 2015-04-16 NOTE — ED Notes (Signed)
Pt is here for a f/u and to have stitches removed from left lower leg  Reports he was seen at [facility] ED on 5/27 Reports swelling and poss inf to left lower leg despite taking antibiotics  Sx have worsen in the last 2 days Alert, no signs of acute distress.

## 2015-04-16 NOTE — ED Provider Notes (Signed)
Patient presented to the ER with swelling of right leg. He was referred from urgent care. Patient recently suffered laceration to his right leg and had sutures placed. He followed up in urgent care today and was noted to have increased swelling and some redness of the leg, was referred to the ER for further evaluation.  Face to face Exam: HEENT - PERRLA Lungs - CTAB Heart - RRR, no M/R/G Abd - S/NT/ND Neuro - alert, oriented x3 Musculoskeletal - bilateral lower extremity edema and stasis dermatitis type skin changes. Right leg is more swollen than left with some weeping from superficial skin breakdown areas. Erythema without warmth of right leg, worse than left  Plan: She is afebrile. He does not have leukocytosis. Symptoms consistent with possible cellulitis versus stasis dermatitis. Venous duplex to rule out DVT, if negative initiate antibiotic coverage and close follow-up.  Orpah Greek, MD 04/16/15 1745

## 2015-04-16 NOTE — ED Provider Notes (Signed)
CSN: [id number]     Arrival date & time 04/16/15  1630 History   First MD Initiated Contact with Patient 04/16/15 1709     Chief Complaint  Patient presents with  . Leg Pain  . Wound Infection     (Consider location/radiation/quality/duration/timing/severity/associated sxs/prior Treatment) Patient is a 81 y.o. male presenting with leg pain. The history is provided by the patient and the spouse. No language interpreter was used.  Leg Pain Associated symptoms: no fever   [name] [name] is an 81 y.o male with a history of hypertension, hyperlipidemia, A. fib who presents for increased right lower leg swelling since the injury to the leg on 04/08/15 in which he struck his leg against a metal bar. The wound was sutured and his tetanus was updated at that time. He states he is on keflex for the lower leg given and only missed one day. He states he went to urgent care today to have the sutures removed and was told to come to the ED for a possible DVT.  He is currently on Plavix. He denies any fever, chills, chest pain, shortness of breath, abdominal pain, urinary symptoms, leg tenderness.  Past Medical History  Diagnosis Date  . GASTRIC POLYP 05/13/2009  . COLONIC POLYPS, ADENOMATOUS 01/14/2008  . HYPERLIPIDEMIA 06/01/2007  . ANEMIA, IRON DEFICIENCY 10/13/2008  . COAGULOPATHY, COUMADIN-INDUCED 01/14/2008  . ANXIETY 09/04/2008  . HYPERTENSION 06/01/2007  . ATRIAL FIBRILLATION, CHRONIC 01/14/2008    on warfarin since 2007  . UNSPECIFIED VENOUS INSUFFICIENCY 09/18/2008  . PLEURAL EFFUSION, LEFT 10/13/2008  . GERD 06/01/2007  . BARRETTS ESOPHAGUS 03/20/2009  . PROSTATE CANCER, HX OF 01/14/2008  . CATARACTS, BILATERAL, HX OF 01/14/2008  . CEREBROVASCULAR ACCIDENT, HX OF 08/18/2008  . PERSONAL HX COLONIC POLYPS 01/01/2010  . BASAL CELL CARCINOMA OF SKIN SITE UNSPECIFIED 09/28/2010  . Hyperglycemia   . Elevated PSA   . Restless leg syndrome   . Mitral regurgitation 06/13/2008    mitral valve replacement #20 St. Jude  Biocor; Maze procedure by Dr. Amador Cunas  at [facility]  . CORONARY ARTERY DISEASE 06/01/2007    Cath 04/03/2008; Cath 11/01/1990  . Tachy-brady syndrome 07/13/2008    medtronic Adapta gen. model ADDR01 ,serial # [id number] H by Dr  [name] at [facility]  . Peripheral artery disease 06/16/2010    LEA doppler-left ABI nml 0.61 calcified vessels;right ABI  0.68  . Edema, peripheral 07/30/2009    LEV doppler- no thrombus or thrombophlebitis  . Aortic valvular disorder 08/16/2012    echo EF 45-50% LV mildly reduce,Biocor mitral valve,mild to mod. tricuspid regrug.,mild to mod. aortic regrug.  . Mitral valve disorder 09/08/2011    echo EF 50-55% LV normal  . Aortic valvular stenosis 11/19/2013  . Cardiomyopathy, ischemic 11/19/2013    EF 45-50% with inferior wall hypokinesis by echo 2014  . S/P CABG x 1 06/13/2008    SVG to LAD with open vein harvest right thigh, LIMA harvested but not utilized by Dr Amador Cunas  . S/P mitral valve replacement with bioprosthetic valve 06/13/2008    38mm St Jude Biocor porcine bioprosthetic tissue valve by Dr Amador Cunas  . S/P Maze operation for atrial fibrillation 06/13/2008    Partial left atrial lesion set using cryothermy for bilateral pulmonary vein isolation by Dr Amador Cunas  . Prosthetic valve dysfunction     Mitral stenosis involving bioprosthetic tissue valve placed 06/13/2008  . Mitral stenosis     Porcine bioprosthetic tissue valve placed 06/13/2008  . Dysrhythmia  HX OF TACHY BRADY  . Pacemaker   . HYPERTHYROIDISM 01/06/2011  . 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 Prostatectomy    . Tonsillectomy    . Cystectomy      Left leg  . Pacemaker placement  07/13/2008    Adapata dual chamber-Medtronic at [employer]   . Coronary artery bypass graft  06/13/2008    graft x1 w/vein graft LAD artery by Dr.  Amador Cunas at [facility]  . Mitral valve replacement  06/13/2008    #20 St. Jude Bicor mitral valve ;Maze procedure at [facility] by Dr. Amador Cunas  . Cardiac catheterization  11/01/1990  . Skin cancer excision      removed from forehead and right leg, nose/face  . Upper gastrointestinal endoscopy  07/14/2009    erosive reflux esopagitis, Barrett's esophagus  . Colonoscopy  07/14/2009    diverticulosis, internal hemorrhoids  . Cardiovascular stress test  06/03/2010    Persantine perfusion EF 45% mild to moderate ischemia basal and mid inferolateral region  . Cardiovascular stress test  01/01/2008    left ventricle normal,no significant ischemia  . Cardiovascular stress test  09/09/2005     possibility of ischemia inferior wall  . Tee without cardioversion N/A 01/22/2014    Procedure: TRANSESOPHAGEAL ECHOCARDIOGRAM (TEE);  Surgeon: Sanda Klein, MD;  Location: [facility] ENDOSCOPY;  Service: Cardiovascular;  Laterality: N/A;  . Coronary stent placement  03/24/2014    DES to LAD  . Transcatheter aortic valve replacement, transfemoral N/A 04/22/2014    Procedure: TRANSCATHETER AORTIC VALVE REPLACEMENT, TRANSFEMORAL;  Surgeon: [name], MD;  Location: [place];  Service: Open Heart Surgery;  Laterality: N/A;  TAVR-TF (RIGHT SIDE)  . Intraoperative transesophageal echocardiogram N/A 04/22/2014    Procedure: INTRAOPERATIVE TRANSTHORACIC ECHOCARDIOGRAM;  Surgeon: [name], MD;  Location: [place];  Service: Open Heart Surgery;  Laterality: N/A;  . Left and right heart catheterization with coronary angiogram N/A 01/08/2014    Procedure: LEFT AND RIGHT HEART CATHETERIZATION WITH CORONARY ANGIOGRAM;  Surgeon: Sanda Klein, MD;  Location: [place] CATH LAB;  Service: Cardiovascular;  Laterality: N/A;  . Percutaneous coronary rotoblator intervention (pci-r) N/A 03/24/2014    Procedure: PERCUTANEOUS CORONARY ROTOBLATOR INTERVENTION (PCI-R);  Surgeon: Blane Ohara, MD;  Location: [facility] CATH LAB;  Service: Cardiovascular;  Laterality:  N/A;   Family History  Problem Relation Age of Onset  . Breast cancer Mother    History  Substance Use Topics  . Smoking status: Former Smoker -- 2 years    Types: Pipe    Quit date: 11/07/1965  . Smokeless tobacco: Never Used     Comment: quit over 40 years ago, never smoked cigarettes  . Alcohol Use: No    Review of Systems  Constitutional: Negative for fever and chills.  Respiratory: Negative for cough and shortness of breath.   Cardiovascular: Positive for leg swelling. Negative for chest pain and palpitations.  Skin: Positive for wound.  Neurological: Negative for dizziness and weakness.  All other systems reviewed and are negative.     Allergies  Review of patient's allergies indicates no known allergies.  Home Medications   Prior to Admission medications   Medication Sig Start Date End Date Taking? Authorizing Provider  amiodarone (PACERONE) 200 MG tablet Take 1 tablet (200 mg total) by mouth daily. Patient taking differently: Take 100 mg by mouth daily.  05/12/14   [name] [name], MD  cephALEXin (KEFLEX) 500 MG capsule Take 1 capsule (500 mg total) by mouth 2 (two) times daily. 04/03/15   Quintella Reichert, MD  clopidogrel (PLAVIX) 75 MG tablet Take 1 tablet (75 mg total) by mouth daily with breakfast. 12/10/14   Sanda Klein, MD  doxycycline (VIBRAMYCIN) 100 MG capsule Take 1 capsule (100 mg total) by mouth 2 (two) times daily. 04/16/15   Fortune Brannigan Patel-Mills, PA-C  ferrous sulfate 325 (65 FE) MG tablet Take 325 mg by mouth daily with breakfast.    Historical Provider, MD  methimazole (TAPAZOLE) 5 MG tablet TAKE 1 TABLET (5 MG TOTAL) BY MOUTH EVERY MONDAY, WEDNESDAY, AND FRIDAY. 03/31/15   [name], MD  metoprolol succinate (TOPROL-XL) 50 MG 24 hr tablet TAKE 1 TABLET (50 MG TOTAL) BY MOUTH DAILY. TAKE WITH OR IMMEDIATELY FOLLOWING A MEAL. 02/23/15   [name] [name], MD  Multiple Vitamin (MULTIVITAMIN WITH MINERALS) TABS Take 1 tablet by mouth daily. Centrum Silver     Historical Provider, MD  pantoprazole (PROTONIX) 40 MG tablet Take 1 tablet (40 mg total) by mouth 2 (two) times daily. 12/30/14   Gatha Mayer, MD  simvastatin (ZOCOR) 40 MG tablet Take 1 tablet (40 mg total) by mouth at bedtime. 10/24/14   [name], MD  Tamsulosin HCl (FLOMAX) 0.4 MG CAPS Take 0.4 mg by mouth every evening.     Historical Provider, MD   BP 154/91 mmHg  Pulse 70  Temp(Src) 98.4 F (36.9 C) (Oral)  Resp 18  Wt 169 lb (76.658 kg)  SpO2 97% Physical Exam  Constitutional: He is oriented to person, place, and time. He appears well-developed and well-nourished.  HENT:  Head: Normocephalic and atraumatic.  Eyes: Conjunctivae are normal.  Neck: Normal range of motion. Neck supple.  Cardiovascular: Normal rate.   Pulmonary/Chest: Effort normal and breath sounds normal. No respiratory distress. He has no wheezes. He has no rales. He exhibits no tenderness.  Abdominal: Soft. There is no tenderness.  Musculoskeletal: Normal range of motion.  Right lower leg edema. No tenderness or warmth. No calf tenderness. Good DP pulses bilaterally. Full R OM of bilateral legs, ankle, toes. Minimal yellow drainage from suture site. Chronic venous stasis bilaterally with brown discoloration of the skin.  Neurological: He is alert and oriented to person, place, and time.  Skin: Skin is warm and dry.  Nursing note and vitals reviewed.   ED Course  Procedures (including critical care time) Labs Review Labs Reviewed  CBC - Abnormal; Notable for the following:    RBC 3.89 (*)    Hemoglobin 11.9 (*)    HCT 36.2 (*)    All other components within normal limits  BASIC METABOLIC PANEL - Abnormal; Notable for the following:    Glucose, Bld 104 (*)    Calcium 8.6 (*)    All other components within normal limits    Imaging Review No results found.   EKG Interpretation None      MDM   Final diagnoses:  Cellulitis of right lower leg  Patient presents for right lower leg edema his  suture were placed on 6/1.  Sutures were removed today while at urgent care.  His some mild yellow drainage from the side. He denies any tenderness. There is no warmth to the area.  He has been taking Keflex for the wound and states he only missed one day.  I discussed stopping the Keflex.  Doppler or LLE: No evidence of DVT, superficial  thrombosis, or Baker's cyst. Patient is currently afebrile, no leukocytosis, no acute distress. He is well-appearin, able to ambulate without difficulty, and is able to go home on doxycycline. I have given him 1 g of Rocephin IM in the ED. I also gave him strict return precautions such as increased swelling or drainage from the wound site, fever.  He can follow up with his PCP and verbally agrees with the plan. I discussed this patient with Dr. Betsey Holiday who has seen the patient and agrees with the plan      Ottie Glazier, PA-C 04/16/15 1951  Ottie Glazier, PA-C 04/16/15 [place], MD 04/16/15 1958

## 2015-04-16 NOTE — ED Notes (Signed)
Off unit with ultrasound.

## 2015-04-17 ENCOUNTER — Ambulatory Visit: Payer: Medicare Other | Admitting: Endocrinology

## 2015-04-20 ENCOUNTER — Encounter: Payer: Self-pay | Admitting: Cardiovascular Disease

## 2015-04-20 ENCOUNTER — Other Ambulatory Visit (HOSPITAL_COMMUNITY): Payer: Medicare Other

## 2015-04-20 ENCOUNTER — Ambulatory Visit (INDEPENDENT_AMBULATORY_CARE_PROVIDER_SITE_OTHER): Payer: Medicare Other | Admitting: Cardiovascular Disease

## 2015-04-20 VITALS — BP 130/72 | HR 70 | Ht 70.0 in | Wt 175.0 lb

## 2015-04-20 DIAGNOSIS — Z954 Presence of other heart-valve replacement: Secondary | ICD-10-CM | POA: Diagnosis not present

## 2015-04-20 DIAGNOSIS — I350 Nonrheumatic aortic (valve) stenosis: Secondary | ICD-10-CM

## 2015-04-20 DIAGNOSIS — Z952 Presence of prosthetic heart valve: Secondary | ICD-10-CM

## 2015-04-20 MED ORDER — FUROSEMIDE 40 MG PO TABS: 40.0000 mg | ORAL_TABLET | ORAL | Status: DC

## 2015-04-20 MED ORDER — POTASSIUM CHLORIDE CRYS ER 20 MEQ PO TBCR: 20.0000 meq | EXTENDED_RELEASE_TABLET | ORAL | Status: DC

## 2015-04-20 NOTE — Progress Notes (Signed)
Cardiology Office Note   Date:  04/20/2015   ID:  [name], DOB January 30, 1929, MRN [id number]  PCP:  [name], MD  Cardiologist:  [name], MD    Chief Complaint  Patient presents with  . Edema    History of Present Illness: [name] [name] is a 81 y.o. male who presents for 1 year follow-up after undergoing transfemoral TAVR 04/22/2014. He was treated with a 29 mm Sapien XT transcatheter heart valve. The patient has a complex cardiac history which included a previous bioprosthetic mitral valve replacement, obstructive coronary disease with rotational atherectomy and stenting of the proximal LAD, and severe low gradient aortic stenosis treated with TAVR. The patient's initial postoperative course was uneventful, and he presented with a lower GI bleed approximately one month after TAVR. He was found to have acute diverticular bleeding and was treated with coil embolization.  He's had an eventful year. Moved to [place]. He cut his leg a few weeks ago and then developed cellulitis. He's been to the ER twice for this and initially required 11 stitches.  He complains of hoarseness. Has undergone ENT evaluation without significant findings other than vocal cord irritation from an ET tube at the time of surgery. He denies shortness of breath with normal activities. No chest pain, orthopnea, or PND.  Past Medical History  Diagnosis Date  . GASTRIC POLYP 05/13/2009  . COLONIC POLYPS, ADENOMATOUS 01/14/2008  . HYPERLIPIDEMIA 06/01/2007  . ANEMIA, IRON DEFICIENCY 10/13/2008  . COAGULOPATHY, COUMADIN-INDUCED 01/14/2008  . ANXIETY 09/04/2008  . HYPERTENSION 06/01/2007  . ATRIAL FIBRILLATION, CHRONIC 01/14/2008    on warfarin since 2007  . UNSPECIFIED VENOUS INSUFFICIENCY 09/18/2008  . PLEURAL EFFUSION, LEFT 10/13/2008  . GERD 06/01/2007  . BARRETTS ESOPHAGUS 03/20/2009  . PROSTATE CANCER, HX OF 01/14/2008  . CATARACTS, BILATERAL, HX OF 01/14/2008  . CEREBROVASCULAR ACCIDENT, HX OF  08/18/2008  . PERSONAL HX COLONIC POLYPS 01/01/2010  . BASAL CELL CARCINOMA OF SKIN SITE UNSPECIFIED 09/28/2010  . Hyperglycemia   . Elevated PSA   . Restless leg syndrome   . Mitral regurgitation 06/13/2008    mitral valve replacement #20 St. Jude Biocor; Maze procedure by Dr. Amador Cunas  at [facility]  . CORONARY ARTERY DISEASE 06/01/2007    Cath 04/03/2008; Cath 11/01/1990  . Tachy-brady syndrome 07/13/2008    medtronic Adapta gen. model ADDR01 ,serial # [id number] H by Dr  [name] at [facility]  . Peripheral artery disease 06/16/2010    LEA doppler-left ABI nml 0.61 calcified vessels;right ABI  0.68  . Edema, peripheral 07/30/2009    LEV doppler- no thrombus or thrombophlebitis  . Aortic valvular disorder 08/16/2012    echo EF 45-50% LV mildly reduce,Biocor mitral valve,mild to mod. tricuspid regrug.,mild to mod. aortic regrug.  . Mitral valve disorder 09/08/2011    echo EF 50-55% LV normal  . Aortic valvular stenosis 11/19/2013  . Cardiomyopathy, ischemic 11/19/2013    EF 45-50% with inferior wall hypokinesis by echo 2014  . S/P CABG x 1 06/13/2008    SVG to LAD with open vein harvest right thigh, LIMA harvested but not utilized by Dr Amador Cunas  . S/P mitral valve replacement with bioprosthetic valve 06/13/2008    65mm St Jude Biocor porcine bioprosthetic tissue valve by Dr Amador Cunas  . S/P Maze operation for atrial fibrillation 06/13/2008    Partial left atrial lesion set using cryothermy for bilateral pulmonary vein isolation by Dr Amador Cunas  . Prosthetic valve dysfunction     Mitral  stenosis involving bioprosthetic tissue valve placed 06/13/2008  . Mitral stenosis     Porcine bioprosthetic tissue valve placed 06/13/2008  . Dysrhythmia     HX OF TACHY BRADY  . Pacemaker   . HYPERTHYROIDISM 01/06/2011  . S/P TAVR (transcatheter aortic valve replacement) 04/22/2014    29 mm Edwards Sapien XT transcatheter heart valve placed via open right transfemoral approach    Past Surgical History  Procedure  Laterality Date  . Prostatectomy    . Tonsillectomy    . Cystectomy      Left leg  . Pacemaker placement  07/13/2008    [place] dual chamber-Medtronic at [facility]   . Coronary artery bypass graft  06/13/2008    graft x1 w/vein graft LAD artery by Dr. Amador Cunas at [facility]  . Mitral valve replacement  06/13/2008    #20 St. Jude Bicor mitral valve ;Maze procedure at [facility] by Dr. Amador Cunas  . Cardiac catheterization  11/01/1990  . Skin cancer excision      removed from forehead and right leg, nose/face  . Upper gastrointestinal endoscopy  07/14/2009    erosive reflux esopagitis, Barrett's esophagus  . Colonoscopy  07/14/2009    diverticulosis, internal hemorrhoids  . Cardiovascular stress test  06/03/2010    Persantine perfusion EF 45% mild to moderate ischemia basal and mid inferolateral region  . Cardiovascular stress test  01/01/2008    left ventricle normal,no significant ischemia  . Cardiovascular stress test  09/09/2005     possibility of ischemia inferior wall  . Tee without cardioversion N/A 01/22/2014    Procedure: TRANSESOPHAGEAL ECHOCARDIOGRAM (TEE);  Surgeon: Sanda Klein, MD;  Location: [facility] ENDOSCOPY;  Service: Cardiovascular;  Laterality: N/A;  . Coronary stent placement  03/24/2014    DES to LAD  . Transcatheter aortic valve replacement, transfemoral N/A 04/22/2014    Procedure: TRANSCATHETER AORTIC VALVE REPLACEMENT, TRANSFEMORAL;  Surgeon: [name], MD;  Location: [place];  Service: Open Heart Surgery;  Laterality: N/A;  TAVR-TF (RIGHT SIDE)  . Intraoperative transesophageal echocardiogram N/A 04/22/2014    Procedure: INTRAOPERATIVE TRANSTHORACIC ECHOCARDIOGRAM;  Surgeon: [name], MD;  Location: [place];  Service: Open Heart Surgery;  Laterality: N/A;  . Left and right heart catheterization with coronary angiogram N/A 01/08/2014    Procedure: LEFT AND RIGHT HEART CATHETERIZATION WITH CORONARY ANGIOGRAM;  Surgeon: Sanda Klein, MD;  Location: [place] CATH LAB;  Service: Cardiovascular;   Laterality: N/A;  . Percutaneous coronary rotoblator intervention (pci-r) N/A 03/24/2014    Procedure: PERCUTANEOUS CORONARY ROTOBLATOR INTERVENTION (PCI-R);  Surgeon: Blane Ohara, MD;  Location: [facility] CATH LAB;  Service: Cardiovascular;  Laterality: N/A;    Current Outpatient Prescriptions  Medication Sig Dispense Refill  . amiodarone (PACERONE) 200 MG tablet Take 100 mg by mouth daily.     . clopidogrel (PLAVIX) 75 MG tablet Take 1 tablet (75 mg total) by mouth daily with breakfast. 90 tablet 3  . doxycycline (VIBRAMYCIN) 100 MG capsule Take 1 capsule (100 mg total) by mouth 2 (two) times daily. 20 capsule 0  . ferrous sulfate 325 (65 FE) MG tablet Take 325 mg by mouth daily with breakfast.    . methimazole (TAPAZOLE) 5 MG tablet TAKE 1 TABLET (5 MG TOTAL) BY MOUTH EVERY MONDAY, WEDNESDAY, AND FRIDAY. 36 tablet 2  . metoprolol succinate (TOPROL-XL) 50 MG 24 hr tablet TAKE 1 TABLET (50 MG TOTAL) BY MOUTH DAILY. TAKE WITH OR IMMEDIATELY FOLLOWING A MEAL. 90 tablet 3  . Multiple Vitamin (MULTIVITAMIN WITH MINERALS) TABS Take 1 tablet  by mouth daily. Centrum Silver    . pantoprazole (PROTONIX) 40 MG tablet Take 1 tablet (40 mg total) by mouth 2 (two) times daily. 180 tablet 3  . simvastatin (ZOCOR) 40 MG tablet Take 1 tablet (40 mg total) by mouth at bedtime. 90 tablet 1  . Tamsulosin HCl (FLOMAX) 0.4 MG CAPS Take 0.4 mg by mouth every evening.     . furosemide (LASIX) 40 MG tablet Take 1 tablet (40 mg total) by mouth every other day. 90 tablet 3  . potassium chloride SA (K-DUR,KLOR-CON) 20 MEQ tablet Take 1 tablet (20 mEq total) by mouth every other day. 90 tablet 3   No current facility-administered medications for this visit.    Allergies:   Review of patient's allergies indicates no known allergies.   Social History:  The patient  reports that he quit smoking about 49 years ago. His smoking use included Pipe. He has never used smokeless tobacco. He reports that he does not drink alcohol  or use illicit drugs.   Family History:  The patient's  family history includes Breast cancer in his mother; Stroke in his mother; Transient ischemic attack in his mother.    ROS:  Please see the history of present illness.  Otherwise, review of systems is positive for leg swelling, hoarseness.  All other systems are reviewed and negative.    PHYSICAL EXAM: VS:  BP 130/72 mmHg  Pulse 70  Ht 5\' 10"  (1.778 m)  Wt 175 lb (79.379 kg)  BMI 25.11 kg/m2  SpO2 99% , BMI Body mass index is 25.11 kg/(m^2). GEN: Well nourished, well developed, pleasant elderly male in no acute distress HEENT: normal Neck: JVP is mildly elevated, no masses. No carotid bruits Cardiac: RRR with grade 1/6 diastolic decrescendo murmur heard best at the right upper sternal border    Respiratory:  clear to auscultation bilaterally, normal work of breathing GI: soft, nontender, nondistended, + BS MS: no deformity or atrophy Ext: Stasis dermatitis bilaterally, 1+ edema right leg, trace Skin: warm and dry, no rash Neuro:  Strength and sensation are intact Psych: euthymic mood, full affect  EKG:  EKG is not ordered today.  Recent Labs: 05/21/2014: Magnesium 2.0 07/25/2014: Pro B Natriuretic peptide (BNP) 214.0* 12/25/2014: ALT 12; TSH 3.736 04/16/2015: BUN 17; Creatinine, Ser 0.98; Hemoglobin 11.9*; Platelets 172; Potassium 4.1; Sodium 140   Lipid Panel     Component Value Date/Time   CHOL 108 12/25/2014 0858   TRIG 55 12/25/2014 0858   HDL 37* 12/25/2014 0858   CHOLHDL 2.9 12/25/2014 0858   VLDL 11 12/25/2014 0858   LDLCALC 60 12/25/2014 0858      Wt Readings from Last 3 Encounters:  04/20/15 175 lb (79.379 kg)  04/16/15 169 lb (76.658 kg)  04/03/15 169 lb (76.658 kg)     Cardiac Studies Reviewed: 2D Echo 03/11/2015: Study Conclusions  - Left ventricle: The cavity size was normal. Wall thickness was increased in a pattern of moderate LVH. Systolic function was moderately reduced. The estimated  ejection fraction was in the range of 35% to 40%. Akinesis of the apicalanteroseptal, inferior, and apical myocardium. - Aortic valve: A bioprosthesis was present. There was moderate regurgitation. Valve area (VTI): 1.74 cm^2. Valve area (Vmax): 1.7 cm^2. Valve area (Vmean): 1.79 cm^2. - Mitral valve: A bioprosthesis was present. Valve area by pressure half-time: 2.16 cm^2. Valve area by continuity equation (using LVOT flow): 1.27 cm^2. - Left atrium: The atrium was severely dilated. - Right atrium: The atrium was  moderately dilated. - Pulmonary arteries: Systolic pressure was mildly to moderately increased. PA peak pressure: 39 mm Hg (S).  ASSESSMENT AND PLAN: Aortic valve disease s/p TAVR (one year visit): NYHA Functional Class 2. I have reviewed his echo images and he does appear to have moderate (2+) paravalvular aortic insufficiency. He understands the need for SBE prophylaxis. Would recommend a yearly echocardiogram to follow aortic insufficiency. Overall he appears stable from a cardiac perspective. He does have mild volume overload without associated exertional symptoms. I recommended that he start back on furosemide 40 mg every other day. He takes potassium supplementation on days that he takes furosemide. He is scheduled to follow-up with Dr. [name] next month. Would be happy to see him back anytime in the future if problems arise.  Current medicines are reviewed with the patient today.  The patient does not have concerns regarding medicines.  Labs/ tests ordered today include:  No orders of the defined types were placed in this encounter.   Disposition:   FU as needed  Signed, [name], MD  04/20/2015 10:37 AM    [facility] [place], [place],   [address] Phone: [phone]; Fax: [phone]

## 2015-04-20 NOTE — Patient Instructions (Signed)
Medication Instructions:  Your physician has recommended you make the following change in your medication:  1. START Furosemide 40mg  take one by mouth every other day 2. START Potassium Chloride 108mEq take one by mouth every other day  Labwork: No new orders.   Testing/Procedures: No new orders.   Follow-Up: Your physician recommends that you keep your scheduled follow-up appointment with Dr [name]  Any Other Special Instructions Will Be Listed Below (If Applicable).

## 2015-05-06 ENCOUNTER — Other Ambulatory Visit: Payer: Self-pay

## 2015-05-06 MED ORDER — SIMVASTATIN 40 MG PO TABS: 40.0000 mg | ORAL_TABLET | Freq: Every day | ORAL | Status: DC

## 2015-05-07 ENCOUNTER — Telehealth: Payer: Self-pay | Admitting: Endocrinology

## 2015-05-07 NOTE — Telephone Encounter (Signed)
Rec'd records form [facility]Forwarding 5 page's to Dr.Ellison

## 2015-05-12 ENCOUNTER — Encounter: Payer: Self-pay | Admitting: Cardiovascular Disease

## 2015-05-12 ENCOUNTER — Ambulatory Visit (INDEPENDENT_AMBULATORY_CARE_PROVIDER_SITE_OTHER): Payer: Medicare Other | Admitting: Cardiovascular Disease

## 2015-05-12 VITALS — BP 142/76 | HR 74 | Ht 70.0 in | Wt 171.0 lb

## 2015-05-12 DIAGNOSIS — Z952 Presence of prosthetic heart valve: Secondary | ICD-10-CM

## 2015-05-12 DIAGNOSIS — J984 Other disorders of lung: Secondary | ICD-10-CM

## 2015-05-12 DIAGNOSIS — Z9861 Coronary angioplasty status: Secondary | ICD-10-CM

## 2015-05-12 DIAGNOSIS — I97110 Postprocedural cardiac insufficiency following cardiac surgery: Secondary | ICD-10-CM

## 2015-05-12 DIAGNOSIS — I359 Nonrheumatic aortic valve disorder, unspecified: Secondary | ICD-10-CM

## 2015-05-12 DIAGNOSIS — I10 Essential (primary) hypertension: Secondary | ICD-10-CM

## 2015-05-12 DIAGNOSIS — I495 Sick sinus syndrome: Secondary | ICD-10-CM

## 2015-05-12 DIAGNOSIS — T8209XA Other mechanical complication of heart valve prosthesis, initial encounter: Secondary | ICD-10-CM

## 2015-05-12 DIAGNOSIS — Z950 Presence of cardiac pacemaker: Secondary | ICD-10-CM

## 2015-05-12 DIAGNOSIS — I255 Ischemic cardiomyopathy: Secondary | ICD-10-CM

## 2015-05-12 DIAGNOSIS — Z953 Presence of xenogenic heart valve: Secondary | ICD-10-CM

## 2015-05-12 DIAGNOSIS — I4891 Unspecified atrial fibrillation: Secondary | ICD-10-CM | POA: Diagnosis not present

## 2015-05-12 DIAGNOSIS — Z954 Presence of other heart-valve replacement: Secondary | ICD-10-CM

## 2015-05-12 DIAGNOSIS — I2510 Atherosclerotic heart disease of native coronary artery without angina pectoris: Secondary | ICD-10-CM

## 2015-05-12 DIAGNOSIS — T8201XA Breakdown (mechanical) of heart valve prosthesis, initial encounter: Secondary | ICD-10-CM

## 2015-05-12 LAB — CUP PACEART INCLINIC DEVICE CHECK
Battery Remaining Longevity: 28 mo
Battery Voltage: 2.75 V
Brady Statistic AS VP Percent: 79.5 %
Lead Channel Impedance Value: 399 Ohm
Lead Channel Impedance Value: 647 Ohm
Lead Channel Pacing Threshold Amplitude: 0.625 V
Lead Channel Pacing Threshold Pulse Width: 0.4 ms
Lead Channel Setting Pacing Amplitude: 2 V
MDC IDC MSMT BATTERY IMPEDANCE: 2079 Ohm
MDC IDC SESS DTM: 20160705115217
MDC IDC SET LEADCHNL RV PACING AMPLITUDE: 2.5 V
MDC IDC SET LEADCHNL RV PACING PULSEWIDTH: 0.4 ms
MDC IDC SET LEADCHNL RV SENSING SENSITIVITY: 4 mV
MDC IDC STAT BRADY AP VP PERCENT: 19 %
MDC IDC STAT BRADY AP VS PERCENT: 0.1 % — AB
MDC IDC STAT BRADY AS VS PERCENT: 1.5 %

## 2015-05-12 NOTE — Progress Notes (Signed)
Patient ID: [name] [name], male   DOB: Apr 20, 1929, 81 y.o.   MRN: [id number]     Cardiology Office Note   Date:  05/12/2015   ID:  [name] [name], DOB 06/02/1929, MRN [id number]  PCP:  [name], MD  Cardiologist:   Sanda Klein, MD   Chief Complaint  Patient presents with  . Follow-up    Patient hasno complaints.      History of Present Illness: [name] [name] is a 81 y.o. male who presents for pacemaker follow-up (sinus node dysfunction, paroxysmal atrial fibrillation status post previous cryoablation), coronary artery disease status post proximal LAD rotational atherectomy and stent, mitral valve replacement with a biological prosthesis, aortic valve stenosis status post TAVR with mild insufficiency. Chronic combined systolic and diastolic heart failure.    [name] [name] generally feels quite well. He scraped his calf on his car door and required 11 stitches for lengthy laceration. He had quite a bit of bleeding since he is on clopidogrel. He denies dyspnea, angina, syncope, palpitations or lower extremity edema  Follow-up echocardiogram (primarily performed to look at his aortic valve stent graft and the degree of aortic insufficiency) showed slight increase in the amount of regurgitation (now mild to moderate) , but showed more importantly a reduction left ventricular ejection fraction. EF was reported at 35-40 percent.  This appears to be synchronous with a marked increase in the prevalence of ventricular pacing which is up to 98%. He only has 20% atrial pacing.   Otherwise pacemaker interrogation shows normal device function. He has frequent premature atrial contractions and occasional bursts of paroxysmal atrial tachycardia with rates of 180-270 bpm, but the longest episode lasted only 1 minute and 18 seconds. During these events ventricular rate is consistently around 70 beats per minutes. He is taking only clopidogrel following problems with GI bleeding (colonic diverticulosis, one  month following implantation of his aortic stent valve, treated with coil embolization.).   He has not had fever, chills, weight loss or other signs of systemic infection.   He had mild volume overload by exam when he saw Dr. Burt Knack and was started back on furosemide every other day with potassium supplements on those days. This is not reflected on his list of medicines currently and I'm not sure whether he is doing this or not.   His cardiac problems date back to 2009 when he was diagnosed with severe mitral insufficiency related to mitral valve prolapse. He was also having problems with paroxysmal atrial fibrillation. Coronary angiography demonstrated moderate coronary artery disease primarily a 70% calcified eccentric proximal LAD lesion with moderate stenoses in the other 2 major coronary arteries. He was referred for robotic mitral valve repair by Dr. Amador Cunas at [facility]. The LIMA was found to be a very small vessel. He received a saphenous vein graft bypass to the LAD. Cryoablation was performed for atrial fibrillation. The mitral valve was replaced with a 29 mm St. Jude Biocor prosthesis. The postoperative course was complicated by an embolic stroke from which she has recovered without sequelae. He received a dual-chamber permanent pacemaker (Medtronic adapta) and is pacemaker dependent with 100% pacing in both the atrium and the ventricle. He has remained on amiodarone therapy with an extremely low burden of atrial fibrillation. As far as I can tell from the record the last documentation of atrial fibrillation was a one-hour episode that occurred in May of 2012. He also remains on warfarin therapy.  Subsequently, he has developed progressive CAD and aortic valve disease and  underwent rotational atherectomy and stenting of the proximal LAD on 03/24/2014 (3 x 8 mm Xience Alpine drug-eluting stent) and then TAVR (29 mm Edwards Sapien) on April 22, 2014.  Left ventricular systolic function was mildly  depressed with an EF of 45-50% primarily due to RV pacing induced asynchrony, but follow-up echo in May 2016 suggested EF dropped to 35-40 percent..  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 This prevents imaging of his heart from transesophageal views  He has mild hyperthyroidism that is well controlled on low dose of methimazole. 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 He has gastroesophageal reflux disease and Barrett's esophagus. He has extensive sigmoid and transverse colonic diverticulosis. One month following aortic stent valve replacement he had diverticular bleeding requiring angiographic coil embolization.   Past Medical History  Diagnosis Date  . GASTRIC POLYP 05/13/2009  . COLONIC POLYPS, ADENOMATOUS 01/14/2008  . HYPERLIPIDEMIA 06/01/2007  . ANEMIA, IRON DEFICIENCY 10/13/2008  . COAGULOPATHY, COUMADIN-INDUCED 01/14/2008  . ANXIETY 09/04/2008  . HYPERTENSION 06/01/2007  . ATRIAL FIBRILLATION, CHRONIC 01/14/2008    on warfarin since 2007  . UNSPECIFIED VENOUS INSUFFICIENCY 09/18/2008  . PLEURAL EFFUSION, LEFT 10/13/2008  . GERD 06/01/2007  . BARRETTS ESOPHAGUS 03/20/2009  . PROSTATE CANCER, HX OF 01/14/2008  . CATARACTS, BILATERAL, HX OF 01/14/2008  . CEREBROVASCULAR ACCIDENT, HX OF 08/18/2008  . PERSONAL HX COLONIC POLYPS 01/01/2010  . BASAL CELL CARCINOMA OF SKIN SITE UNSPECIFIED 09/28/2010  . Hyperglycemia   . Elevated PSA   . Restless leg syndrome   . Mitral regurgitation 06/13/2008    mitral valve replacement #20 St. Jude Biocor; Maze procedure by Dr. Amador Cunas  at [facility]  . CORONARY ARTERY DISEASE 06/01/2007    Cath 04/03/2008; Cath 11/01/1990  . Tachy-brady syndrome 07/13/2008    medtronic Adapta gen. model ADDR01 ,serial # [id number] H by Dr  [name] at [facility]  . Peripheral artery disease 06/16/2010    LEA doppler-left ABI nml 0.61 calcified vessels;right ABI  0.68  . Edema, peripheral 07/30/2009    LEV doppler- no thrombus or  thrombophlebitis  . Aortic valvular disorder 08/16/2012    echo EF 45-50% LV mildly reduce,Biocor mitral valve,mild to mod. tricuspid regrug.,mild to mod. aortic regrug.  . Mitral valve disorder 09/08/2011    echo EF 50-55% LV normal  . Aortic valvular stenosis 11/19/2013  . Cardiomyopathy, ischemic 11/19/2013    EF 45-50% with inferior wall hypokinesis by echo 2014  . S/P CABG x 1 06/13/2008    SVG to LAD with open vein harvest right thigh, LIMA harvested but not utilized by Dr Amador Cunas  . S/P mitral valve replacement with bioprosthetic valve 06/13/2008    46mm St Jude Biocor porcine bioprosthetic tissue valve by Dr Amador Cunas  . S/P Maze operation for atrial fibrillation 06/13/2008    Partial left atrial lesion set using cryothermy for bilateral pulmonary vein isolation by Dr Amador Cunas  . Prosthetic valve dysfunction     Mitral stenosis involving bioprosthetic tissue valve placed 06/13/2008  . Mitral stenosis     Porcine bioprosthetic tissue valve placed 06/13/2008  . Dysrhythmia     HX OF TACHY BRADY  . Pacemaker   . HYPERTHYROIDISM 01/06/2011  . 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 Prostatectomy    . Tonsillectomy    .  Cystectomy      Left leg  . Pacemaker placement  07/13/2008    [place] dual chamber-Medtronic at [facility]   . Coronary artery bypass graft  06/13/2008    graft x1 w/vein graft LAD artery by Dr. Amador Cunas at [facility]  . Mitral valve replacement  06/13/2008    #20 St. Jude Bicor mitral valve ;Maze procedure at [facility] by Dr. Amador Cunas  . Cardiac catheterization  11/01/1990  . Skin cancer excision      removed from forehead and right leg, nose/face  . Upper gastrointestinal endoscopy  07/14/2009    erosive reflux esopagitis, Barrett's esophagus  . Colonoscopy  07/14/2009    diverticulosis, internal hemorrhoids  .  Cardiovascular stress test  06/03/2010    Persantine perfusion EF 45% mild to moderate ischemia basal and mid inferolateral region  . Cardiovascular stress test  01/01/2008    left ventricle normal,no significant ischemia  . Cardiovascular stress test  09/09/2005     possibility of ischemia inferior wall  . Tee without cardioversion N/A 01/22/2014    Procedure: TRANSESOPHAGEAL ECHOCARDIOGRAM (TEE);  Surgeon: Sanda Klein, MD;  Location: [facility] ENDOSCOPY;  Service: Cardiovascular;  Laterality: N/A;  . Coronary stent placement  03/24/2014    DES to LAD  . Transcatheter aortic valve replacement, transfemoral N/A 04/22/2014    Procedure: TRANSCATHETER AORTIC VALVE REPLACEMENT, TRANSFEMORAL;  Surgeon: [name], MD;  Location: [place];  Service: Open Heart Surgery;  Laterality: N/A;  TAVR-TF (RIGHT SIDE)  . Intraoperative transesophageal echocardiogram N/A 04/22/2014    Procedure: INTRAOPERATIVE TRANSTHORACIC ECHOCARDIOGRAM;  Surgeon: [name], MD;  Location: [place];  Service: Open Heart Surgery;  Laterality: N/A;  . Left and right heart catheterization with coronary angiogram N/A 01/08/2014    Procedure: LEFT AND RIGHT HEART CATHETERIZATION WITH CORONARY ANGIOGRAM;  Surgeon: Sanda Klein, MD;  Location: [place] CATH LAB;  Service: Cardiovascular;  Laterality: N/A;  . Percutaneous coronary rotoblator intervention (pci-r) N/A 03/24/2014    Procedure: PERCUTANEOUS CORONARY ROTOBLATOR INTERVENTION (PCI-R);  Surgeon: Blane Ohara, MD;  Location: [facility] CATH LAB;  Service: Cardiovascular;  Laterality: N/A;     Current Outpatient Prescriptions  Medication Sig Dispense Refill  . clopidogrel (PLAVIX) 75 MG tablet Take 1 tablet (75 mg total) by mouth daily with breakfast. 90 tablet 3  . ferrous sulfate 325 (65 FE) MG tablet Take 325 mg by mouth daily with breakfast.    . methimazole (TAPAZOLE) 5 MG tablet TAKE 1 TABLET (5 MG TOTAL) BY MOUTH EVERY MONDAY, WEDNESDAY, AND FRIDAY. 36 tablet 2  . metoprolol  succinate (TOPROL-XL) 50 MG 24 hr tablet TAKE 1 TABLET (50 MG TOTAL) BY MOUTH DAILY. TAKE WITH OR IMMEDIATELY FOLLOWING A MEAL. 90 tablet 3  . Multiple Vitamin (MULTIVITAMIN WITH MINERALS) TABS Take 1 tablet by mouth daily. Centrum Silver    . pantoprazole (PROTONIX) 40 MG tablet Take 1 tablet (40 mg total) by mouth 2 (two) times daily. 180 tablet 3  . simvastatin (ZOCOR) 40 MG tablet Take 1 tablet (40 mg total) by mouth at bedtime. 90 tablet 0  . Tamsulosin HCl (FLOMAX) 0.4 MG CAPS Take 0.4 mg by mouth every evening.      No current facility-administered medications for this visit.    Allergies:   Review of patient's allergies indicates no known allergies.    Social History:  The patient  reports that he quit smoking about 49 years ago. His smoking use included Pipe. He has never used smokeless tobacco. He reports that he does not  drink alcohol or use illicit drugs.   Family History:  The patient's family history includes Breast cancer in his mother; Stroke in his mother; Transient ischemic attack in his mother.    ROS:  Please see the history of present illness.    Otherwise, review of systems positive for none.   All other systems are reviewed and negative.    PHYSICAL EXAM: VS:  BP 142/76 mmHg  Pulse 74  Ht 5\' 10"  (1.778 m)  Wt 171 lb (77.565 kg)  BMI 24.54 kg/m2 , BMI Body mass index is 24.54 kg/(m^2).  General: Alert, oriented x3, no distress  Head: no evidence of trauma, PERRL, EOMI, no exophtalmos or lid lag, no myxedema, no xanthelasma; normal ears, nose and oropharynx  Neck: 8-10 cm elevation in jugular venous pulsations, +ve hepatojugular reflux; brisk carotid pulses without delay and no carotid bruits  Chest: clear to auscultation, no signs of consolidation by percussion or palpation, normal fremitus, symmetrical and full respiratory excursions, sternotomy scar  Cardiovascular: normal position and quality of the apical impulse, regular rhythm, normal first and second  heart sounds, no systolic murmurs, grade 6-3/7 decrescendo diastolic murmur at the right upper and right lower sternal border , no rubs or gallops  Abdomen: no tenderness or distention, no masses by palpation, no abnormal pulsatility or arterial bruits, normal bowel sounds, large liver roughly 3-4 cm beneath the right costal margin  Extremities: no clubbing, cyanosis; 3+ calf swelling symmetrically; scabbed over pretibial lacerations without signs of cellulitis; 2+ radial, ulnar and brachial pulses bilaterally; 2+ right femoral, posterior tibial and dorsalis pedis pulses; 2+ left femoral, posterior tibial and dorsalis pedis pulses; no subclavian or femoral bruits  Neurological: grossly nonfocal  EKG:  EKG is ordered today. The ekg ordered today shows transition: from atrial sensed ventricular paced rhythm to sinus rhythm with native AV conduction. During the native ventricular beats there are extremely prominent repolarization changes and the QT interval is 510 ms, possibly representing memory T waves   Recent Labs: 05/21/2014: Magnesium 2.0 07/25/2014: Pro B Natriuretic peptide (BNP) 214.0* 12/25/2014: ALT 12; TSH 3.736 04/16/2015: BUN 17; Creatinine, Ser 0.98; Hemoglobin 11.9*; Platelets 172; Potassium 4.1; Sodium 140    Lipid Panel    Component Value Date/Time   CHOL 108 12/25/2014 0858   TRIG 55 12/25/2014 0858   HDL 37* 12/25/2014 0858   CHOLHDL 2.9 12/25/2014 0858   VLDL 11 12/25/2014 0858   LDLCALC 60 12/25/2014 0858      Wt Readings from Last 3 Encounters:  05/12/15 171 lb (77.565 kg)  04/20/15 175 lb (79.379 kg)  04/16/15 169 lb (76.658 kg)      Other studies Reviewed: Additional studies/ records that were reviewed today inclnotes from Dr. [name] and Dr. Christinia Gully.   ASSESSMENT AND PLAN:  1. Chronic combined systolic and diastolic heart failure - he seems to be at his "dry weight" of around 170 pounds. When he saw Dr. Burt Knack 2-3weeks ago he weighed 175  pounds and he was prescribed furosemide. Need to confirm that he is still taking this medication as prescribed every other day. It is possible that his deterioration in LV EF is related to the greatly increased burden of ventricular pacing.  2. Paroxysmal atrial tachycardia and history of paroxysmal atrial fibrillation (currently with very brief episodes that are asymptomatic). He is not on anticoagulation due to GI bleeding complications. The amiodarone may be in large part responsible for the increased burden of ventricular pacing. We'll try to see  if weaning off this medication leads to any benefit. Stop the amiodarone but continue beta blockers, at least for the time being.  3. Normal function of dual-chamber permanent pacemaker, programmed DDI in attempts to minimize ventricular pacing while avoiding tracking her frequent episodes of brief paroxysmal atrial tachycardia  4. Status post mitral valve biological prosthesis with normal function  5. Status post TAVR with mild to moderate aortic insufficiency by most recent echocardiogram, not clinically Important at this time.   6. CAD status post drug-eluting stent to proximal LAD artery June 2016. Since there is still an issue of possible atrial arrhythmia, will continue the clopidogrel   7. Very large diaphragmatic hernia with gastric and colon contents in the thoracic cavity  8. Hypertension, well controlled   9. Hyperlipidemia with excellent LDL cholesterol   10. 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     Current medicines are reviewed at length with the patient today.  The patient does not have concerns regarding medicines.  The following changes have been made:  Stop amiodarone   Labs/ tests ordered today include:  Orders Placed This Encounter  Procedures  . Implantable device check  . EKG 12-Lead      Patient Instructions  Medication Instructions:   STOP THE  AMIODARONE  Labwork:  NONE ORDERED TODAY BUT WHOEVER IS TREATING YOUR THYROID NEEDS TO RECHECK A TSH  Testing/Procedures:  ONE  Follow-Up:  Dr. [name] recommends that you schedule a follow-up appointment in: [place]           Signed, Lillieann Pavlich, MD  05/12/2015 6:25 PM    Sanda Klein, MD, [facility] [phone] office [phone] pager

## 2015-05-12 NOTE — Patient Instructions (Signed)
Medication Instructions:   STOP THE AMIODARONE  Labwork:  NONE ORDERED TODAY BUT WHOEVER IS TREATING YOUR THYROID NEEDS TO RECHECK A TSH  Testing/Procedures:  ONE  Follow-Up:  Dr. [name] recommends that you schedule a follow-up appointment in: [place]

## 2015-05-21 ENCOUNTER — Telehealth: Payer: Self-pay | Admitting: Endocrinology

## 2015-05-21 MED ORDER — SIMVASTATIN 40 MG PO TABS: 40.0000 mg | ORAL_TABLET | Freq: Every day | ORAL | Status: DC

## 2015-05-21 NOTE — Telephone Encounter (Signed)
Rx sent per pt's request.  

## 2015-05-21 NOTE — Telephone Encounter (Signed)
Pt needs simvastatin called into [facility]

## 2015-06-08 ENCOUNTER — Telehealth: Payer: Self-pay | Admitting: Endocrinology

## 2015-06-08 NOTE — Telephone Encounter (Signed)
Patient need a refill for simvastatin (ZOCOR) 40 MG tablet send to  [facility] [place], [place] [phone] (Phone) [phone] (Fax)

## 2015-06-10 NOTE — Telephone Encounter (Signed)
I contacted the pt to discuss the swelling in his groin. He sated the swelling has gone down and has not been bothering him anymore. Pt advised to contact our office if the swelling returns or becomes worse. Pt voiced understanding.

## 2015-06-10 NOTE — Telephone Encounter (Signed)
[facility] note dated 05/10/15 at 9:18 Initial Comment Caller States his has swelling in his groin area, walnut sized bruise. Confirm and document reason for call. If symptomatic, describe symptoms. ---Please see record [id number]. The previous record was under the wrong client type. It was created using the Day and was supposed to be Night. Charge nurse Deb states that it needed to be changed.

## 2015-06-15 ENCOUNTER — Telehealth: Payer: Self-pay | Admitting: Endocrinology

## 2015-06-15 ENCOUNTER — Other Ambulatory Visit: Payer: Self-pay

## 2015-06-15 MED ORDER — SIMVASTATIN 40 MG PO TABS: 40.0000 mg | ORAL_TABLET | Freq: Every day | ORAL | Status: DC

## 2015-06-15 NOTE — Telephone Encounter (Signed)
Simvastatin rx needs to be called in to [facility] he is going to be out this week

## 2015-06-15 NOTE — Telephone Encounter (Signed)
Rx sent to [facility] per pt's request.

## 2015-06-17 ENCOUNTER — Ambulatory Visit (INDEPENDENT_AMBULATORY_CARE_PROVIDER_SITE_OTHER): Payer: Medicare Other | Admitting: Podiatry

## 2015-06-17 ENCOUNTER — Encounter: Payer: Self-pay | Admitting: Podiatry

## 2015-06-17 VITALS — BP 139/64 | HR 69 | Resp 12

## 2015-06-17 DIAGNOSIS — L84 Corns and callosities: Secondary | ICD-10-CM

## 2015-06-17 DIAGNOSIS — B351 Tinea unguium: Secondary | ICD-10-CM | POA: Diagnosis not present

## 2015-06-17 DIAGNOSIS — M79673 Pain in unspecified foot: Secondary | ICD-10-CM | POA: Diagnosis not present

## 2015-06-17 NOTE — Progress Notes (Signed)
   Subjective:    Patient ID: [name] [name], male    DOB: [birth year], 81 y.o.   MRN: [id number]  HPI   This patient presents for a scheduled visit and is requesting debridement of toenails and a corn on the second left toe. The last visit for a similar service was on 02/11/2015.  Chart review of 02/11/2015 by Dr. Rolley Sims Pedal pulses 2/4 bilaterally. Neurologic sensation to [place] monofilament wire normal. The nails were described as thickened, discolored, dystrophic, friable and brittle and yellow and brown. A corn was noted on the medial border of the second left toe. At that time the assessment was symptomatic onychomycoses and corn second left toe and debridement was provided  Review of Systems  All other systems reviewed and are negative.      Objective:   Physical Exam  The toenails are elongated, brittle, discolored, hypertrophic 6-10 Well-organized keratose second left toe  Hammertoe deformities 2-4 right and 3-4 left    Assessment & Plan:   Assessment: Mycotic toenails 6-10 Keratoses 1  Plan: Debridement of toenails 10 mechanically and electrically without any bleeding Debrided keratoses 1 without a bleeding  Reappoint at patient's request

## 2015-07-06 ENCOUNTER — Emergency Department (HOSPITAL_COMMUNITY)
Admission: EM | Admit: 2015-07-06 | Discharge: 2015-07-06 | Disposition: A | Discharge status: Home / Self Care | Payer: Medicare Other | Attending: Emergency Medicine | Admitting: Emergency Medicine

## 2015-07-06 ENCOUNTER — Emergency Department (HOSPITAL_COMMUNITY): Payer: Medicare Other

## 2015-07-06 ENCOUNTER — Encounter (HOSPITAL_COMMUNITY): Payer: Self-pay

## 2015-07-06 DIAGNOSIS — E785 Hyperlipidemia, unspecified: Secondary | ICD-10-CM | POA: Diagnosis not present

## 2015-07-06 DIAGNOSIS — W19XXXA Unspecified fall, initial encounter: Secondary | ICD-10-CM

## 2015-07-06 DIAGNOSIS — K219 Gastro-esophageal reflux disease without esophagitis: Secondary | ICD-10-CM | POA: Diagnosis not present

## 2015-07-06 DIAGNOSIS — R51 Headache: Secondary | ICD-10-CM | POA: Diagnosis not present

## 2015-07-06 DIAGNOSIS — Y998 Other external cause status: Secondary | ICD-10-CM | POA: Diagnosis not present

## 2015-07-06 DIAGNOSIS — Z86010 Personal history of colonic polyps: Secondary | ICD-10-CM | POA: Diagnosis not present

## 2015-07-06 DIAGNOSIS — Z8546 Personal history of malignant neoplasm of prostate: Secondary | ICD-10-CM | POA: Diagnosis not present

## 2015-07-06 DIAGNOSIS — I10 Essential (primary) hypertension: Secondary | ICD-10-CM | POA: Insufficient documentation

## 2015-07-06 DIAGNOSIS — I2510 Atherosclerotic heart disease of native coronary artery without angina pectoris: Secondary | ICD-10-CM | POA: Insufficient documentation

## 2015-07-06 DIAGNOSIS — Y9289 Other specified places as the place of occurrence of the external cause: Secondary | ICD-10-CM | POA: Diagnosis not present

## 2015-07-06 DIAGNOSIS — Z79899 Other long term (current) drug therapy: Secondary | ICD-10-CM | POA: Insufficient documentation

## 2015-07-06 DIAGNOSIS — W228XXA Striking against or struck by other objects, initial encounter: Secondary | ICD-10-CM | POA: Insufficient documentation

## 2015-07-06 DIAGNOSIS — I509 Heart failure, unspecified: Secondary | ICD-10-CM | POA: Insufficient documentation

## 2015-07-06 DIAGNOSIS — Y9389 Activity, other specified: Secondary | ICD-10-CM | POA: Insufficient documentation

## 2015-07-06 DIAGNOSIS — Z8659 Personal history of other mental and behavioral disorders: Secondary | ICD-10-CM | POA: Diagnosis not present

## 2015-07-06 DIAGNOSIS — S0993XA Unspecified injury of face, initial encounter: Secondary | ICD-10-CM | POA: Diagnosis present

## 2015-07-06 DIAGNOSIS — Z8673 Personal history of transient ischemic attack (TIA), and cerebral infarction without residual deficits: Secondary | ICD-10-CM | POA: Diagnosis not present

## 2015-07-06 DIAGNOSIS — S0083XA Contusion of other part of head, initial encounter: Secondary | ICD-10-CM | POA: Insufficient documentation

## 2015-07-06 DIAGNOSIS — Z87891 Personal history of nicotine dependence: Secondary | ICD-10-CM | POA: Insufficient documentation

## 2015-07-06 DIAGNOSIS — Z951 Presence of aortocoronary bypass graft: Secondary | ICD-10-CM | POA: Insufficient documentation

## 2015-07-06 DIAGNOSIS — Z7902 Long term (current) use of antithrombotics/antiplatelets: Secondary | ICD-10-CM | POA: Diagnosis not present

## 2015-07-06 MED ORDER — HYDROCODONE-ACETAMINOPHEN 5-325 MG PO TABS: 1.0000 | ORAL_TABLET | Freq: Four times a day (QID) | ORAL | Status: DC | PRN

## 2015-07-06 NOTE — ED Provider Notes (Signed)
CSN: [id number]     Arrival date & time 07/06/15  0351 History   First MD Initiated Contact with Patient 07/06/15 [phone]     Chief Complaint  Patient presents with  . Fall     (Consider location/radiation/quality/duration/timing/severity/associated sxs/prior Treatment) HPI  This is an 81 year old male with a history of hypertension, atrial fibrillation, coronary artery disease who presents following a fall. Patient reports that he rolled over and fell out of bed. He remembers the events. He denies loss of consciousness. He reports pain over the left cheek.  Current pain is 2 out of 10. He denies any neck pain. He takes Plavix daily. No longer takes Coumadin. Denies any shortness of breath or abdominal pain.  He has been ambulatory. Denies any hip, leg, or shoulder pain.  Past Medical History  Diagnosis Date  . GASTRIC POLYP 05/13/2009  . COLONIC POLYPS, ADENOMATOUS 01/14/2008  . HYPERLIPIDEMIA 06/01/2007  . ANEMIA, IRON DEFICIENCY 10/13/2008  . COAGULOPATHY, COUMADIN-INDUCED 01/14/2008  . ANXIETY 09/04/2008  . HYPERTENSION 06/01/2007  . ATRIAL FIBRILLATION, CHRONIC 01/14/2008    on warfarin since 2007  . UNSPECIFIED VENOUS INSUFFICIENCY 09/18/2008  . PLEURAL EFFUSION, LEFT 10/13/2008  . GERD 06/01/2007  . BARRETTS ESOPHAGUS 03/20/2009  . PROSTATE CANCER, HX OF 01/14/2008  . CATARACTS, BILATERAL, HX OF 01/14/2008  . CEREBROVASCULAR ACCIDENT, HX OF 08/18/2008  . PERSONAL HX COLONIC POLYPS 01/01/2010  . BASAL CELL CARCINOMA OF SKIN SITE UNSPECIFIED 09/28/2010  . Hyperglycemia   . Elevated PSA   . Restless leg syndrome   . Mitral regurgitation 06/13/2008    mitral valve replacement #20 St. Jude Biocor; Maze procedure by Dr. Amador Cunas  at [facility]  . CORONARY ARTERY DISEASE 06/01/2007    Cath 04/03/2008; Cath 11/01/1990  . Tachy-brady syndrome 07/13/2008    medtronic Adapta gen. model ADDR01 ,serial # [id number] H by Dr  [name] at [facility]  . Peripheral artery disease 06/16/2010    LEA doppler-left  ABI nml 0.61 calcified vessels;right ABI  0.68  . Edema, peripheral 07/30/2009    LEV doppler- no thrombus or thrombophlebitis  . Aortic valvular disorder 08/16/2012    echo EF 45-50% LV mildly reduce,Biocor mitral valve,mild to mod. tricuspid regrug.,mild to mod. aortic regrug.  . Mitral valve disorder 09/08/2011    echo EF 50-55% LV normal  . Aortic valvular stenosis 11/19/2013  . Cardiomyopathy, ischemic 11/19/2013    EF 45-50% with inferior wall hypokinesis by echo 2014  . S/P CABG x 1 06/13/2008    SVG to LAD with open vein harvest right thigh, LIMA harvested but not utilized by Dr Amador Cunas  . S/P mitral valve replacement with bioprosthetic valve 06/13/2008    7mm St Jude Biocor porcine bioprosthetic tissue valve by Dr Amador Cunas  . S/P Maze operation for atrial fibrillation 06/13/2008    Partial left atrial lesion set using cryothermy for bilateral pulmonary vein isolation by Dr Amador Cunas  . Prosthetic valve dysfunction     Mitral stenosis involving bioprosthetic tissue valve placed 06/13/2008  . Mitral stenosis     Porcine bioprosthetic tissue valve placed 06/13/2008  . Dysrhythmia     HX OF TACHY BRADY  . Pacemaker   . HYPERTHYROIDISM 01/06/2011  . 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 Prostatectomy    . Tonsillectomy    . Cystectomy  Left leg  . Pacemaker placement  07/13/2008    [place] dual chamber-Medtronic at [facility]   . Coronary artery bypass graft  06/13/2008    graft x1 w/vein graft LAD artery by Dr. Amador Cunas at [facility]  . Mitral valve replacement  06/13/2008    #20 St. Jude Bicor mitral valve ;Maze procedure at [facility] by Dr. Amador Cunas  . Cardiac catheterization  11/01/1990  . Skin cancer excision      removed from forehead and right leg, nose/face  . Upper gastrointestinal endoscopy  07/14/2009    erosive reflux  esopagitis, Barrett's esophagus  . Colonoscopy  07/14/2009    diverticulosis, internal hemorrhoids  . Cardiovascular stress test  06/03/2010    Persantine perfusion EF 45% mild to moderate ischemia basal and mid inferolateral region  . Cardiovascular stress test  01/01/2008    left ventricle normal,no significant ischemia  . Cardiovascular stress test  09/09/2005     possibility of ischemia inferior wall  . Tee without cardioversion N/A 01/22/2014    Procedure: TRANSESOPHAGEAL ECHOCARDIOGRAM (TEE);  Surgeon: Sanda Klein, MD;  Location: [facility] ENDOSCOPY;  Service: Cardiovascular;  Laterality: N/A;  . Coronary stent placement  03/24/2014    DES to LAD  . Transcatheter aortic valve replacement, transfemoral N/A 04/22/2014    Procedure: TRANSCATHETER AORTIC VALVE REPLACEMENT, TRANSFEMORAL;  Surgeon: [name], MD;  Location: [place];  Service: Open Heart Surgery;  Laterality: N/A;  TAVR-TF (RIGHT SIDE)  . Intraoperative transesophageal echocardiogram N/A 04/22/2014    Procedure: INTRAOPERATIVE TRANSTHORACIC ECHOCARDIOGRAM;  Surgeon: [name], MD;  Location: [place];  Service: Open Heart Surgery;  Laterality: N/A;  . Left and right heart catheterization with coronary angiogram N/A 01/08/2014    Procedure: LEFT AND RIGHT HEART CATHETERIZATION WITH CORONARY ANGIOGRAM;  Surgeon: Sanda Klein, MD;  Location: [place] CATH LAB;  Service: Cardiovascular;  Laterality: N/A;  . Percutaneous coronary rotoblator intervention (pci-r) N/A 03/24/2014    Procedure: PERCUTANEOUS CORONARY ROTOBLATOR INTERVENTION (PCI-R);  Surgeon: Blane Ohara, MD;  Location: [facility] CATH LAB;  Service: Cardiovascular;  Laterality: N/A;   Family History  Problem Relation Age of Onset  . Breast cancer Mother   . Stroke Mother   . Transient ischemic attack Mother    Social History  Substance Use Topics  . Smoking status: Former Smoker -- 2 years    Types: Pipe    Quit date: 11/07/1965  . Smokeless tobacco: Never Used     Comment:  quit over 40 years ago, never smoked cigarettes  . Alcohol Use: No    Review of Systems  Constitutional: Negative.  Negative for fever.  HENT: Positive for facial swelling. Negative for nosebleeds.   Respiratory: Negative.  Negative for chest tightness and shortness of breath.   Cardiovascular: Negative.  Negative for chest pain.  Gastrointestinal: Negative.  Negative for abdominal pain.  Genitourinary: Negative.  Negative for dysuria.  Musculoskeletal: Negative for back pain and neck pain.  Skin: Positive for wound.  Neurological: Positive for dizziness. Negative for syncope, light-headedness and headaches.  All other systems reviewed and are negative.     Allergies  Review of patient's allergies indicates no known allergies.  Home Medications   Prior to Admission medications   Medication Sig Start Date End Date Taking? Authorizing Provider  clopidogrel (PLAVIX) 75 MG tablet Take 1 tablet (75 mg total) by mouth daily with breakfast. 12/10/14  Yes [name] [name], MD  ferrous sulfate 325 (65 FE) MG tablet Take 325 mg by mouth daily with breakfast.  Yes Historical Provider, MD  methimazole (TAPAZOLE) 5 MG tablet TAKE 1 TABLET (5 MG TOTAL) BY MOUTH EVERY MONDAY, WEDNESDAY, AND FRIDAY. 03/31/15  Yes [name], MD  metoprolol succinate (TOPROL-XL) 50 MG 24 hr tablet TAKE 1 TABLET (50 MG TOTAL) BY MOUTH DAILY. TAKE WITH OR IMMEDIATELY FOLLOWING A MEAL. 02/23/15  Yes [name] [name], MD  Multiple Vitamin (MULTIVITAMIN WITH MINERALS) TABS Take 1 tablet by mouth daily. Centrum Silver   Yes Historical Provider, MD  pantoprazole (PROTONIX) 40 MG tablet Take 1 tablet (40 mg total) by mouth 2 (two) times daily. 12/30/14  Yes Gatha Mayer, MD  simvastatin (ZOCOR) 40 MG tablet Take 1 tablet (40 mg total) by mouth at bedtime. 06/15/15  Yes [name], MD  Tamsulosin HCl (FLOMAX) 0.4 MG CAPS Take 0.4 mg by mouth every evening.    Yes Historical Provider, MD  HYDROcodone-acetaminophen (NORCO/VICODIN)  5-325 MG per tablet Take 1 tablet by mouth every 6 (six) hours as needed. 07/06/15   Merryl Hacker, MD   BP 145/82 mmHg  Pulse 70  Temp(Src) 97.5 F (36.4 C) (Oral)  Resp 20  SpO2 97% Physical Exam  Constitutional: He is oriented to person, place, and time. No distress.  Elderly  HENT:  Head: Normocephalic.  Hematoma noted over the left zygomatic arch, and no midface instability, contusions noted over the left lower cheek, extraocular movements intact, no evidence of hemotympanum, no evidence of battle sign or raccoon eyes  Eyes: EOM are normal. Pupils are equal, round, and reactive to light.  Neck: Normal range of motion. Neck supple.  No midline C-spine tenderness  Cardiovascular: Normal rate, regular rhythm and normal heart sounds.   No murmur heard. Pulmonary/Chest: Effort normal and breath sounds normal. No respiratory distress. He has no wheezes.  Abdominal: Soft. Bowel sounds are normal. There is no tenderness. There is no rebound.  Musculoskeletal: He exhibits no edema.  No obvious deformities, normal range of motion of bilateral hips and knees  Neurological: He is alert and oriented to person, place, and time.  Skin: Skin is warm and dry.  Psychiatric: He has a normal mood and affect.  Nursing note and vitals reviewed.   ED Course  Procedures (including critical care time) Labs Review Labs Reviewed - No data to display  Imaging Review Ct Head Wo Contrast  07/06/2015   CLINICAL DATA:  Initial valuation for acute trauma, fall.  EXAM: CT HEAD WITHOUT CONTRAST  CT MAXILLOFACIAL WITHOUT CONTRAST  CT CERVICAL SPINE WITHOUT CONTRAST  TECHNIQUE: Multidetector CT imaging of the head, cervical spine, and maxillofacial structures were performed using the standard protocol without intravenous contrast. Multiplanar CT image reconstructions of the cervical spine and maxillofacial structures were also generated.  COMPARISON:  None.  FINDINGS: CT HEAD FINDINGS  Advanced age-related  cerebral atrophy present. Chronic microvascular ischemic changes present within the periventricular white matter. Remote lacunar infarct within the bilateral basal ganglia. Extensive vascular calcifications present within the distal vertebral arteries and carotid siphons.  No acute large vessel territory infarct. No intracranial hemorrhage. No extra-axial fluid collection. No mass lesion, midline shift or mass effect. Ventricular prominence related to global parenchymal volume loss present without hydrocephalus.  Small scalp contusion present at the right forehead. Scalp soft tissues otherwise unremarkable.  Calvarium intact.  CT MAXILLOFACIAL FINDINGS  Contusion present at the right frontal scalp. Left periorbital/ facial contusion present as well. No other soft tissue abnormality.  Globes intact.  No retro-orbital hematoma.  Bony orbits intact without evidence for  orbital floor fracture.  Zygomatic arches intact. No maxillary fracture. Pterygoid plates intact. Nasal bones intact. Nasal septum intact.  Mandible intact. Mandibular condyles normally situated within the temporomandibular fossa.  Mild scattered opacity within the posterior right ethmoidal air cells. Paranasal sinuses are otherwise clear.  CT CERVICAL SPINE FINDINGS  Exaggeration of the normal cervical lordosis. Trace anterolisthesis of C7 on T1, likely chronic. Vertebral body heights are preserved. Normal C1-2 articulations are intact. No prevertebral soft tissue swelling. No acute fracture or listhesis.  Advanced degenerative disc disease present at C6-7.  Visualized soft tissues of the neck are demonstrate no acute abnormality. Scatter nodules noted within the thyroid gland. Prominent vascular calcifications present within the neck and visualized aortic arch. Visualized lung apices are clear without evidence of apical pneumothorax.  IMPRESSION: CT HEAD:  1. No acute intracranial process. 2. Right forehead scalp contusion. 3. Atrophy with chronic  microvascular ischemic disease and remote lacunar infarcts within the bilateral basal ganglia.  CT MAXILLOFACIAL:  1. No acute maxillofacial fracture. 2. Left periorbital/facial contusion.  CT CERVICAL SPINE:  No acute traumatic injury within the cervical spine.   Electronically Signed   By: Jeannine Boga M.D.   On: 07/06/2015 06:39   Ct Cervical Spine Wo Contrast  07/06/2015   CLINICAL DATA:  Initial valuation for acute trauma, fall.  EXAM: CT HEAD WITHOUT CONTRAST  CT MAXILLOFACIAL WITHOUT CONTRAST  CT CERVICAL SPINE WITHOUT CONTRAST  TECHNIQUE: Multidetector CT imaging of the head, cervical spine, and maxillofacial structures were performed using the standard protocol without intravenous contrast. Multiplanar CT image reconstructions of the cervical spine and maxillofacial structures were also generated.  COMPARISON:  None.  FINDINGS: CT HEAD FINDINGS  Advanced age-related cerebral atrophy present. Chronic microvascular ischemic changes present within the periventricular white matter. Remote lacunar infarct within the bilateral basal ganglia. Extensive vascular calcifications present within the distal vertebral arteries and carotid siphons.  No acute large vessel territory infarct. No intracranial hemorrhage. No extra-axial fluid collection. No mass lesion, midline shift or mass effect. Ventricular prominence related to global parenchymal volume loss present without hydrocephalus.  Small scalp contusion present at the right forehead. Scalp soft tissues otherwise unremarkable.  Calvarium intact.  CT MAXILLOFACIAL FINDINGS  Contusion present at the right frontal scalp. Left periorbital/ facial contusion present as well. No other soft tissue abnormality.  Globes intact.  No retro-orbital hematoma.  Bony orbits intact without evidence for orbital floor fracture.  Zygomatic arches intact. No maxillary fracture. Pterygoid plates intact. Nasal bones intact. Nasal septum intact.  Mandible intact. Mandibular  condyles normally situated within the temporomandibular fossa.  Mild scattered opacity within the posterior right ethmoidal air cells. Paranasal sinuses are otherwise clear.  CT CERVICAL SPINE FINDINGS  Exaggeration of the normal cervical lordosis. Trace anterolisthesis of C7 on T1, likely chronic. Vertebral body heights are preserved. Normal C1-2 articulations are intact. No prevertebral soft tissue swelling. No acute fracture or listhesis.  Advanced degenerative disc disease present at C6-7.  Visualized soft tissues of the neck are demonstrate no acute abnormality. Scatter nodules noted within the thyroid gland. Prominent vascular calcifications present within the neck and visualized aortic arch. Visualized lung apices are clear without evidence of apical pneumothorax.  IMPRESSION: CT HEAD:  1. No acute intracranial process. 2. Right forehead scalp contusion. 3. Atrophy with chronic microvascular ischemic disease and remote lacunar infarcts within the bilateral basal ganglia.  CT MAXILLOFACIAL:  1. No acute maxillofacial fracture. 2. Left periorbital/facial contusion.  CT CERVICAL SPINE:  No acute  traumatic injury within the cervical spine.   Electronically Signed   By: Jeannine Boga M.D.   On: 07/06/2015 06:39   Ct Maxillofacial Wo Cm  07/06/2015   CLINICAL DATA:  Initial valuation for acute trauma, fall.  EXAM: CT HEAD WITHOUT CONTRAST  CT MAXILLOFACIAL WITHOUT CONTRAST  CT CERVICAL SPINE WITHOUT CONTRAST  TECHNIQUE: Multidetector CT imaging of the head, cervical spine, and maxillofacial structures were performed using the standard protocol without intravenous contrast. Multiplanar CT image reconstructions of the cervical spine and maxillofacial structures were also generated.  COMPARISON:  None.  FINDINGS: CT HEAD FINDINGS  Advanced age-related cerebral atrophy present. Chronic microvascular ischemic changes present within the periventricular white matter. Remote lacunar infarct within the bilateral  basal ganglia. Extensive vascular calcifications present within the distal vertebral arteries and carotid siphons.  No acute large vessel territory infarct. No intracranial hemorrhage. No extra-axial fluid collection. No mass lesion, midline shift or mass effect. Ventricular prominence related to global parenchymal volume loss present without hydrocephalus.  Small scalp contusion present at the right forehead. Scalp soft tissues otherwise unremarkable.  Calvarium intact.  CT MAXILLOFACIAL FINDINGS  Contusion present at the right frontal scalp. Left periorbital/ facial contusion present as well. No other soft tissue abnormality.  Globes intact.  No retro-orbital hematoma.  Bony orbits intact without evidence for orbital floor fracture.  Zygomatic arches intact. No maxillary fracture. Pterygoid plates intact. Nasal bones intact. Nasal septum intact.  Mandible intact. Mandibular condyles normally situated within the temporomandibular fossa.  Mild scattered opacity within the posterior right ethmoidal air cells. Paranasal sinuses are otherwise clear.  CT CERVICAL SPINE FINDINGS  Exaggeration of the normal cervical lordosis. Trace anterolisthesis of C7 on T1, likely chronic. Vertebral body heights are preserved. Normal C1-2 articulations are intact. No prevertebral soft tissue swelling. No acute fracture or listhesis.  Advanced degenerative disc disease present at C6-7.  Visualized soft tissues of the neck are demonstrate no acute abnormality. Scatter nodules noted within the thyroid gland. Prominent vascular calcifications present within the neck and visualized aortic arch. Visualized lung apices are clear without evidence of apical pneumothorax.  IMPRESSION: CT HEAD:  1. No acute intracranial process. 2. Right forehead scalp contusion. 3. Atrophy with chronic microvascular ischemic disease and remote lacunar infarcts within the bilateral basal ganglia.  CT MAXILLOFACIAL:  1. No acute maxillofacial fracture. 2. Left  periorbital/facial contusion.  CT CERVICAL SPINE:  No acute traumatic injury within the cervical spine.   Electronically Signed   By: Jeannine Boga M.D.   On: 07/06/2015 06:39   I have personally reviewed and evaluated these images and lab results as part of my medical decision-making.   EKG Interpretation None      MDM   Final diagnoses:  Fall, initial encounter  Facial contusion, initial encounter    Patient presents after falling out of bed. Evidence of contusion hematoma to the head. He is currently on Plavix. Denies neck pain. Denies syncope. Is otherwise well appearing. No other obvious signs of injury. CT of the head, face, and neck obtained. No bleeding noted. Patient does have facial contusions. These are evident on physical exam. Discussed results with the patient and his wife. He ambulated without difficulty in the hallway. Will discharge home. Patient declining pain medication. Instructed to use ice when necessary for swelling.  After history, exam, and medical workup I feel the patient has been appropriately medically screened and is safe for discharge home. Pertinent diagnoses were discussed with the patient. Patient was given  return precautions.     Merryl Hacker, MD 07/07/15 [phone]

## 2015-07-06 NOTE — ED Notes (Signed)
Pt is on plavix 

## 2015-07-06 NOTE — Discharge Instructions (Signed)
You were seen today following a fall. You have evidence of bruising and contusion to your face. He continues ice 4 times daily as needed for any swelling. Tylenol for pain or you will be given a short course of narcotic pain medication.  Contusion A contusion is a deep bruise. Contusions are the result of an injury that caused bleeding under the skin. The contusion may turn blue, purple, or yellow. Minor injuries will give you a painless contusion, but more severe contusions may stay painful and swollen for a few weeks.  CAUSES  A contusion is usually caused by a blow, trauma, or direct force to an area of the body. SYMPTOMS   Swelling and redness of the injured area.  Bruising of the injured area.  Tenderness and soreness of the injured area.  Pain. DIAGNOSIS  The diagnosis can be made by taking a history and physical exam. An X-ray, CT scan, or MRI may be needed to determine if there were any associated injuries, such as fractures. TREATMENT  Specific treatment will depend on what area of the body was injured. In general, the best treatment for a contusion is resting, icing, elevating, and applying cold compresses to the injured area. Over-the-counter medicines may also be recommended for pain control. Ask your caregiver what the best treatment is for your contusion. HOME CARE INSTRUCTIONS   Put ice on the injured area.  Put ice in a plastic bag.  Place a towel between your skin and the bag.  Leave the ice on for 15-20 minutes, 3-4 times a day, or as directed by your health care provider.  Only take over-the-counter or prescription medicines for pain, discomfort, or fever as directed by your caregiver. Your caregiver may recommend avoiding anti-inflammatory medicines (aspirin, ibuprofen, and naproxen) for 48 hours because these medicines may increase bruising.  Rest the injured area.  If possible, elevate the injured area to reduce swelling. SEEK IMMEDIATE MEDICAL CARE IF:   You  have increased bruising or swelling.  You have pain that is getting worse.  Your swelling or pain is not relieved with medicines. MAKE SURE YOU:   Understand these instructions.  Will watch your condition.  Will get help right away if you are not doing well or get worse. Document Released: 08/03/2005 Document Revised: 10/29/2013 Document Reviewed: 08/29/2011 [facility] Patient Information 2015 [place], [place]. This information is not intended to replace advice given to you by your health care provider. Make sure you discuss any questions you have with your health care provider.

## 2015-07-06 NOTE — ED Notes (Signed)
PT from [facility].

## 2015-07-06 NOTE — ED Notes (Signed)
Dr. Horton at bedside. 

## 2015-07-06 NOTE — ED Notes (Signed)
Per [facility]: pt is from home. Pt was asleep and rolled out of bed and hit the left side of his face on the night stand. Pts wife witnessed fall. No LOC. Spinal cleared by [facility]. No abrasions. No bleeding. Pt does have a large hematoma to his left cheek.

## 2015-07-06 NOTE — ED Notes (Signed)
Pt does have an abrasion to his left hand. The area has steri strips on it.

## 2015-08-03 ENCOUNTER — Ambulatory Visit (INDEPENDENT_AMBULATORY_CARE_PROVIDER_SITE_OTHER): Payer: Medicare Other

## 2015-08-03 DIAGNOSIS — Z23 Encounter for immunization: Secondary | ICD-10-CM

## 2015-08-24 ENCOUNTER — Telehealth: Payer: Self-pay | Admitting: Cardiovascular Disease

## 2015-08-24 NOTE — Telephone Encounter (Signed)
Marcie Bal called from [place], indicates she got a request from [facility] for records for this patient for Dr. Cordelia Pen office - they are on epic and this is viewable in electronic chart. Calling as FYI.

## 2015-08-25 ENCOUNTER — Ambulatory Visit (INDEPENDENT_AMBULATORY_CARE_PROVIDER_SITE_OTHER): Payer: Medicare Other | Admitting: Cardiovascular Disease

## 2015-08-25 ENCOUNTER — Encounter: Payer: Self-pay | Admitting: Cardiovascular Disease

## 2015-08-25 VITALS — BP 140/64 | HR 72 | Resp 16 | Ht 70.0 in | Wt 172.0 lb

## 2015-08-25 DIAGNOSIS — Z952 Presence of prosthetic heart valve: Secondary | ICD-10-CM

## 2015-08-25 DIAGNOSIS — I10 Essential (primary) hypertension: Secondary | ICD-10-CM

## 2015-08-25 DIAGNOSIS — I4891 Unspecified atrial fibrillation: Secondary | ICD-10-CM

## 2015-08-25 DIAGNOSIS — I495 Sick sinus syndrome: Secondary | ICD-10-CM

## 2015-08-25 DIAGNOSIS — I351 Nonrheumatic aortic (valve) insufficiency: Secondary | ICD-10-CM

## 2015-08-25 DIAGNOSIS — R0600 Dyspnea, unspecified: Secondary | ICD-10-CM | POA: Diagnosis not present

## 2015-08-25 DIAGNOSIS — Z9861 Coronary angioplasty status: Secondary | ICD-10-CM

## 2015-08-25 DIAGNOSIS — I255 Ischemic cardiomyopathy: Secondary | ICD-10-CM

## 2015-08-25 DIAGNOSIS — Z7901 Long term (current) use of anticoagulants: Secondary | ICD-10-CM

## 2015-08-25 DIAGNOSIS — I2510 Atherosclerotic heart disease of native coronary artery without angina pectoris: Secondary | ICD-10-CM

## 2015-08-25 DIAGNOSIS — I350 Nonrheumatic aortic (valve) stenosis: Secondary | ICD-10-CM

## 2015-08-25 DIAGNOSIS — Z950 Presence of cardiac pacemaker: Secondary | ICD-10-CM

## 2015-08-25 DIAGNOSIS — I5032 Chronic diastolic (congestive) heart failure: Secondary | ICD-10-CM

## 2015-08-25 DIAGNOSIS — I050 Rheumatic mitral stenosis: Secondary | ICD-10-CM

## 2015-08-25 DIAGNOSIS — J984 Other disorders of lung: Secondary | ICD-10-CM

## 2015-08-25 DIAGNOSIS — Z954 Presence of other heart-valve replacement: Secondary | ICD-10-CM

## 2015-08-25 LAB — CUP PACEART INCLINIC DEVICE CHECK
Battery Remaining Longevity: 27 mo
Battery Voltage: 2.74 V
Brady Statistic AP VP Percent: 17 %
Date Time Interrogation Session: 20161018141721
Implantable Lead Implant Date: 20090906
Implantable Lead Location: 753859
Implantable Lead Model: 5076
Lead Channel Impedance Value: 424 Ohm
Lead Channel Impedance Value: 718 Ohm
Lead Channel Pacing Threshold Amplitude: 0.625 V
Lead Channel Pacing Threshold Pulse Width: 0.4 ms
Lead Channel Setting Pacing Amplitude: 2.5 V
Lead Channel Setting Sensing Sensitivity: 2.8 mV
MDC IDC LEAD IMPLANT DT: 20090906
MDC IDC LEAD LOCATION: 753860
MDC IDC MSMT BATTERY IMPEDANCE: 2203 Ohm
MDC IDC MSMT LEADCHNL RA SENSING INTR AMPL: 1.4 mV
MDC IDC MSMT LEADCHNL RV PACING THRESHOLD AMPLITUDE: 0.5 V
MDC IDC MSMT LEADCHNL RV PACING THRESHOLD PULSEWIDTH: 0.4 ms
MDC IDC MSMT LEADCHNL RV SENSING INTR AMPL: 8 mV
MDC IDC SET LEADCHNL RA PACING AMPLITUDE: 2 V
MDC IDC SET LEADCHNL RV PACING PULSEWIDTH: 0.4 ms
MDC IDC STAT BRADY AP VS PERCENT: 0 %
MDC IDC STAT BRADY AS VP PERCENT: 78 %
MDC IDC STAT BRADY AS VS PERCENT: 5 %

## 2015-08-25 NOTE — Patient Instructions (Signed)
Your physician wants you to follow-up in: 6 Months. You will receive a reminder letter in the mail two months in advance. If you don't receive a letter, please call our office to schedule the follow-up appointment.  Remote monitoring is used to monitor your Pacemaker of ICD from home. This monitoring reduces the number of office visits required to check your device to one time per year. It allows [place] to keep an eye on the functioning of your device to ensure it is working properly. You are scheduled for a device check from home on 11/24/2015. You may send your transmission at any time that day. If you have a wireless device, the transmission will be sent automatically. After your physician reviews your transmission, you will receive a postcard with your next transmission date.  Your physician has requested that you have an echocardiogram. Echocardiography is a painless test that uses sound waves to create images of your heart. It provides your doctor with information about the size and shape of your heart and how well your heart's chambers and valves are working. This procedure takes approximately one hour. There are no restrictions for this procedure. 1 Month  Your physician recommends that you return for lab work in: TSH and Free T4  If you need a refill on your cardiac medications before your next appointment, please call your pharmacy.

## 2015-08-26 ENCOUNTER — Encounter: Payer: Self-pay | Admitting: Cardiovascular Disease

## 2015-08-26 NOTE — Progress Notes (Signed)
Patient ID: [name] [name], male   DOB: [birth year], 81 y.o.   MRN: [id number]     Cardiology Office Note   Date:  08/26/2015   ID:  [name] [name], DOB October 15, 1929, MRN [id number]  PCP:  [name] Shin, MD  Cardiologist:   [name] Klein, MD   Chief Complaint  Patient presents with  . 3 MONTH FOLLOW UP      History of Present Illness: [name] [name] is a 81 y.o. male who presents for follow-up for valvular heart disease, coronary artery disease, persistent atrial fibrillation and a pacemaker check.  [name] is feeling quite well. He walks frequently with his wife and now can usually out pacer. He only develops dyspnea when he bends over (he has a very large diaphragmatic hernia with stomach and colon in the thoracic cavity). He denies lower showed edema and has not had angina pectoris.  Interrogation of his pacemaker shows normal device function. At his last appointment in July we discontinued amiodarone since this appeared to be responsible for the high burden of ventricular pacing. Amiodarone is also felt to be the cause of hyperthyroidism. He has been in persistent atrial fibrillation since August 29 with good ventricular rate control. In fact he has 95% ventricular pacing. He is unaware of the arrhythmia. He developed a severe colonic diverticular bleed requiring coil embolization and blood transfusion, which led to interruption of conventional anticoagulation several months ago. He has not had overt bleeding since then.  His cardiac problems date back to 2009 when he was diagnosed with severe mitral insufficiency related to mitral valve prolapse. He was also having problems with paroxysmal atrial fibrillation. Coronary angiography demonstrated moderate coronary artery disease primarily a 70% calcified eccentric proximal LAD lesion with moderate stenoses in the other 2 major coronary arteries. He was referred for robotic mitral valve repair by Dr. Amador [name] at [facility]. The LIMA was found to be a  very small vessel. He received a saphenous vein graft bypass to the LAD. Cryoablation was performed for atrial fibrillation. The mitral valve was replaced with a 29 mm St. Jude Biocor prosthesis. The postoperative course was complicated by an embolic stroke from which she has recovered without sequelae. He received a dual-chamber permanent pacemaker (Medtronic adapta) and was pacemaker dependent with 100% pacing in both the atrium and the ventricle, though he has subsequently regained AV conduction at least partly. Subsequently, he has developed progressive CAD and aortic valve disease and underwent rotational atherectomy and stenting of the proximal LAD on 03/24/2014 (3 x 8 mm Xience Alpine drug-eluting stent) and then TAVR (29 mm Edwards Sapien) on April 22, 2014.  Left ventricular systolic function was mildly depressed with an EF of 45-50% primarily due to RV pacing induced asynchrony, but follow-up echo in May 2016 suggested EF dropped to 35-40 percent. 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 This prevents imaging of his heart from transesophageal views  He has mild hyperthyroidism that is well controlled on low dose of methimazole. 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 He has gastroesophageal reflux disease and Barrett's esophagus. He has extensive sigmoid and transverse colonic diverticulosis. One month following aortic stent valve replacement he had diverticular bleeding requiring angiographic coil embolization.  Past Medical History  Diagnosis Date  . GASTRIC POLYP 05/13/2009  . COLONIC POLYPS, ADENOMATOUS 01/14/2008  . HYPERLIPIDEMIA 06/01/2007  . ANEMIA, IRON DEFICIENCY 10/13/2008  . COAGULOPATHY, COUMADIN-INDUCED 01/14/2008  . ANXIETY 09/04/2008  . HYPERTENSION 06/01/2007  .  ATRIAL FIBRILLATION, CHRONIC 01/14/2008    on warfarin since 2007  . UNSPECIFIED VENOUS INSUFFICIENCY 09/18/2008  . PLEURAL EFFUSION, LEFT 10/13/2008  . GERD 06/01/2007  .  BARRETTS ESOPHAGUS 03/20/2009  . PROSTATE CANCER, HX OF 01/14/2008  . CATARACTS, BILATERAL, HX OF 01/14/2008  . CEREBROVASCULAR ACCIDENT, HX OF 08/18/2008  . PERSONAL HX COLONIC POLYPS 01/01/2010  . BASAL CELL CARCINOMA OF SKIN SITE UNSPECIFIED 09/28/2010  . Hyperglycemia   . Elevated PSA   . Restless leg syndrome   . Mitral regurgitation 06/13/2008    mitral valve replacement #20 St. Jude Biocor; Maze procedure by Dr. Amador [name]  at [facility]  . CORONARY ARTERY DISEASE 06/01/2007    Cath 04/03/2008; Cath 11/01/1990  . Tachy-brady syndrome ([place]) 07/13/2008    medtronic Adapta gen. model ADDR01 ,serial # [id number] H by Dr  [name] [name] at [place] [facility]  . Peripheral artery disease ([place]) 06/16/2010    LEA doppler-left ABI nml 0.61 calcified vessels;right ABI  0.68  . Edema, peripheral 07/30/2009    LEV doppler- no thrombus or thrombophlebitis  . Aortic valvular disorder 08/16/2012    echo EF 45-50% LV mildly reduce,Biocor mitral valve,mild to mod. tricuspid regrug.,mild to mod. aortic regrug.  . Mitral valve disorder 09/08/2011    echo EF 50-55% LV normal  . Aortic valvular stenosis 11/19/2013  . Cardiomyopathy, ischemic 11/19/2013    EF 45-50% with inferior wall hypokinesis by echo 2014  . S/P CABG x 1 06/13/2008    SVG to LAD with open vein harvest right thigh, LIMA harvested but not utilized by Dr [name] [name]  . S/P mitral valve replacement with bioprosthetic valve 06/13/2008    10mm St Jude Biocor porcine bioprosthetic tissue valve by Dr [name] [name]  . S/P Maze operation for atrial fibrillation 06/13/2008    Partial left atrial lesion set using cryothermy for bilateral pulmonary vein isolation by Dr [name] [name]  . Prosthetic valve dysfunction     Mitral stenosis involving bioprosthetic tissue valve placed 06/13/2008  . Mitral stenosis     Porcine bioprosthetic tissue valve placed 06/13/2008  . Dysrhythmia     HX OF TACHY BRADY  . Pacemaker   . HYPERTHYROIDISM 01/06/2011  . S/P TAVR (transcatheter aortic valve  replacement) 04/22/2014    29 mm Edwards Sapien XT transcatheter heart valve placed via open right transfemoral approach    Past Surgical History  Procedure Laterality Date  . Prostatectomy    . Tonsillectomy    . Cystectomy      Left leg  . Pacemaker placement  07/13/2008    [place] dual chamber-Medtronic at [facility]   . Coronary artery bypass graft  06/13/2008    graft x1 w/vein graft LAD artery by Dr. Amador [name] at [facility]  . Mitral valve replacement  06/13/2008    #20 St. Jude Bicor mitral valve ;Maze procedure at [facility] by Dr. Amador [name]  . Cardiac catheterization  11/01/1990  . Skin cancer excision      removed from forehead and right leg, nose/face  . Upper gastrointestinal endoscopy  07/14/2009    erosive reflux esopagitis, Barrett's esophagus  . Colonoscopy  07/14/2009    diverticulosis, internal hemorrhoids  . Cardiovascular stress test  06/03/2010    Persantine perfusion EF 45% mild to moderate ischemia basal and mid inferolateral region  . Cardiovascular stress test  01/01/2008    left ventricle normal,no significant ischemia  . Cardiovascular stress test  09/09/2005     possibility of ischemia inferior wall  . Tee without cardioversion N/A 01/22/2014  Procedure: TRANSESOPHAGEAL ECHOCARDIOGRAM (TEE);  Surgeon: [name] Klein, MD;  Location: [facility] ENDOSCOPY;  Service: Cardiovascular;  Laterality: N/A;  . Coronary stent placement  03/24/2014    DES to LAD  . Transcatheter aortic valve replacement, transfemoral N/A 04/22/2014    Procedure: TRANSCATHETER AORTIC VALVE REPLACEMENT, TRANSFEMORAL;  Surgeon: [name], MD;  Location: [place];  Service: Open Heart Surgery;  Laterality: N/A;  TAVR-TF (RIGHT SIDE)  . Intraoperative transesophageal echocardiogram N/A 04/22/2014    Procedure: INTRAOPERATIVE TRANSTHORACIC ECHOCARDIOGRAM;  Surgeon: [name], MD;  Location: [place];  Service: Open Heart Surgery;  Laterality: N/A;  . Left and right heart catheterization with coronary angiogram N/A  01/08/2014    Procedure: LEFT AND RIGHT HEART CATHETERIZATION WITH CORONARY ANGIOGRAM;  Surgeon: [name] Klein, MD;  Location: [place] CATH LAB;  Service: Cardiovascular;  Laterality: N/A;  . Percutaneous coronary rotoblator intervention (pci-r) N/A 03/24/2014    Procedure: PERCUTANEOUS CORONARY ROTOBLATOR INTERVENTION (PCI-R);  Surgeon: Blane Ohara, MD;  Location: [facility] CATH LAB;  Service: Cardiovascular;  Laterality: N/A;     Current Outpatient Prescriptions  Medication Sig Dispense Refill  . clopidogrel (PLAVIX) 75 MG tablet Take 1 tablet (75 mg total) by mouth daily with breakfast. 90 tablet 3  . ferrous sulfate 325 (65 FE) MG tablet Take 325 mg by mouth daily with breakfast.    . methimazole (TAPAZOLE) 5 MG tablet TAKE 1 TABLET (5 MG TOTAL) BY MOUTH EVERY MONDAY, WEDNESDAY, AND FRIDAY. 36 tablet 2  . metoprolol succinate (TOPROL-XL) 50 MG 24 hr tablet TAKE 1 TABLET (50 MG TOTAL) BY MOUTH DAILY. TAKE WITH OR IMMEDIATELY FOLLOWING A MEAL. 90 tablet 3  . Multiple Vitamin (MULTIVITAMIN WITH MINERALS) TABS Take 1 tablet by mouth daily. Centrum Silver    . pantoprazole (PROTONIX) 40 MG tablet Take 1 tablet (40 mg total) by mouth 2 (two) times daily. 180 tablet 3  . simvastatin (ZOCOR) 40 MG tablet Take 1 tablet (40 mg total) by mouth at bedtime. 90 tablet 2  . Tamsulosin HCl (FLOMAX) 0.4 MG CAPS Take 0.4 mg by mouth every evening.      No current facility-administered medications for this visit.    Allergies:   Review of patient's allergies indicates no known allergies.    Social History:  The patient  reports that he quit smoking about 49 years ago. His smoking use included Pipe. He has never used smokeless tobacco. He reports that he does not drink alcohol or use illicit drugs.   Family History:  The patient's family history includes Breast cancer in his mother; Stroke in his mother; Transient ischemic attack in his mother.    ROS:  Please see the history of present illness.    Otherwise,  review of systems positive for none.   All other systems are reviewed and negative.    PHYSICAL EXAM: VS:  BP 162/64 mmHg  Pulse 72  Ht 5\' 10"  (1.778 m)  Wt 172 lb (78.019 kg)  BMI 24.68 kg/m2 , BMI Body mass index is 24.68 kg/(m^2).  General: Alert, oriented x3, no distress Head: no evidence of trauma, PERRL, EOMI, no exophtalmos or lid lag, no myxedema, no xanthelasma; normal ears, nose and oropharynx Neck: normal jugular venous pulsations and no hepatojugular reflux; brisk carotid pulses without delay and no carotid bruits Chest: clear to auscultation, no signs of consolidation by percussion or palpation, normal fremitus, symmetrical and full respiratory excursions Cardiovascular: normal position and quality of the apical impulse, regular rhythm, normal first and paradoxically split second  heart sounds, grade 2-3/6 decrescendo diastolic murmur at the right lower sternal border, grade 1/6 early peaking systolic ejection murmur in the aortic focus, no rubs or gallops Abdomen: no tenderness or distention, no masses by palpation, no abnormal pulsatility or arterial bruits, normal bowel sounds, no hepatosplenomegaly Extremities: no clubbing, cyanosis or edema; 2+ radial, ulnar and brachial pulses bilaterally; 2+ right femoral, posterior tibial and dorsalis pedis pulses; 2+ left femoral, posterior tibial and dorsalis pedis pulses; no subclavian or femoral bruits Neurological: grossly nonfocal Psych: euthymic mood, full affect   EKG:  EKG is not ordered today.   Recent Labs: 12/25/2014: ALT 12; TSH 3.736 04/16/2015: BUN 17; Creatinine, Ser 0.98; Hemoglobin 11.9*; Platelets 172; Potassium 4.1; Sodium 140    Lipid Panel    Component Value Date/Time   CHOL 108 12/25/2014 0858   TRIG 55 12/25/2014 0858   HDL 37* 12/25/2014 0858   CHOLHDL 2.9 12/25/2014 0858   VLDL 11 12/25/2014 0858   LDLCALC 60 12/25/2014 0858      Wt Readings from Last 3 Encounters:  08/25/15 172 lb (78.019 kg)    05/12/15 171 lb (77.565 kg)  04/20/15 175 lb (79.379 kg)      Other studies Reviewed:    ASSESSMENT AND PLAN:  1. Chronic combined systolic and diastolic heart failure - he seems to be at his "dry weight" of around 170 pounds. Clinically, he has no evidence of active problems with hypervolemia. It is possible that his deterioration in LV EF is related to the greatly increased burden of ventricular pacing.  2. Paroxysmal atrial tachycardia and history of paroxysmal atrial fibrillation (currently in persistent atrial fibrillation for almost 2 months). He is not on anticoagulation due to GI bleeding complications. The amiodarone may be in large part responsible for the increased burden of ventricular pacing. He seems completely oblivious to the fact that he is in persistent atrial fibrillation. At this point in time I think the benefits of amiodarone are outweighed by its risks and complications. We'll keep him off the medication and have no plans for cardioversion at this time.  3. Normal function of dual-chamber permanent pacemaker, programmed DDI in attempts to minimize ventricular pacing while avoiding tracking frequent episodes of brief paroxysmal atrial tachycardia. Continue remote checks every 3 months  4. Status post mitral valve biological prosthesis with normal function  5. Status post TAVR with mild to moderate aortic insufficiency by most recent echocardiogram, not clinically important at this time. Repeat echocardiogram at 6 month intervals to make sure that the aortic insufficiency is a stable abnormality  6. CAD status post drug-eluting stent to proximal LAD artery June 2016. Plan to continue the clopidogrel unless he has bleeding complications.   7. Very large diaphragmatic hernia with gastric and colon contents in the thoracic cavity  8. Hypertension, well controlled   9. Hyperlipidemia with excellent LDL cholesterol   10. 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 Recheck labs. If TSH and free T4 normal will discuss stopping methimazole with Dr. Loanne Drilling.  Current medicines are reviewed at length with the patient today.  The patient does not have concerns regarding medicines.  The following changes have been made:  no change  Labs/ tests ordered today include:  Orders Placed This Encounter  Procedures  . TSH  . T4, free  . ECHOCARDIOGRAM COMPLETE      Patient Instructions  Your physician wants you to follow-up in: 6 Months. You will receive a reminder letter in  the mail two months in advance. If you don't receive a letter, please call our office to schedule the follow-up appointment.  Remote monitoring is used to monitor your Pacemaker of ICD from home. This monitoring reduces the number of office visits required to check your device to one time per year. It allows [place] to keep an eye on the functioning of your device to ensure it is working properly. You are scheduled for a device check from home on 11/24/2015. You may send your transmission at any time that day. If you have a wireless device, the transmission will be sent automatically. After your physician reviews your transmission, you will receive a postcard with your next transmission date.  Your physician has requested that you have an echocardiogram. Echocardiography is a painless test that uses sound waves to create images of your heart. It provides your doctor with information about the size and shape of your heart and how well your heart's chambers and valves are working. This procedure takes approximately one hour. There are no restrictions for this procedure. 1 Month  Your physician recommends that you return for lab work in: TSH and Free T4  If you need a refill on your cardiac medications before your next appointment, please call your pharmacy.          Mikael Spray, MD  08/26/2015 8:24 PM    [name] Klein, MD,  [facility] [phone] office [phone] pager

## 2015-09-03 ENCOUNTER — Emergency Department (HOSPITAL_COMMUNITY)
Admission: EM | Admit: 2015-09-03 | Discharge: 2015-09-03 | Disposition: A | Discharge status: Home / Self Care | Payer: Medicare Other | Attending: Emergency Medicine | Admitting: Emergency Medicine

## 2015-09-03 ENCOUNTER — Emergency Department (HOSPITAL_COMMUNITY): Payer: Medicare Other

## 2015-09-03 ENCOUNTER — Encounter (HOSPITAL_COMMUNITY): Payer: Self-pay | Admitting: Emergency Medicine

## 2015-09-03 DIAGNOSIS — D509 Iron deficiency anemia, unspecified: Secondary | ICD-10-CM | POA: Insufficient documentation

## 2015-09-03 DIAGNOSIS — Y9389 Activity, other specified: Secondary | ICD-10-CM | POA: Insufficient documentation

## 2015-09-03 DIAGNOSIS — E785 Hyperlipidemia, unspecified: Secondary | ICD-10-CM | POA: Diagnosis not present

## 2015-09-03 DIAGNOSIS — Z8673 Personal history of transient ischemic attack (TIA), and cerebral infarction without residual deficits: Secondary | ICD-10-CM | POA: Insufficient documentation

## 2015-09-03 DIAGNOSIS — S61402A Unspecified open wound of left hand, initial encounter: Secondary | ICD-10-CM | POA: Insufficient documentation

## 2015-09-03 DIAGNOSIS — Y998 Other external cause status: Secondary | ICD-10-CM | POA: Insufficient documentation

## 2015-09-03 DIAGNOSIS — Z86010 Personal history of colonic polyps: Secondary | ICD-10-CM | POA: Insufficient documentation

## 2015-09-03 DIAGNOSIS — Z951 Presence of aortocoronary bypass graft: Secondary | ICD-10-CM | POA: Insufficient documentation

## 2015-09-03 DIAGNOSIS — Z85828 Personal history of other malignant neoplasm of skin: Secondary | ICD-10-CM | POA: Insufficient documentation

## 2015-09-03 DIAGNOSIS — E0590 Thyrotoxicosis, unspecified without thyrotoxic crisis or storm: Secondary | ICD-10-CM | POA: Insufficient documentation

## 2015-09-03 DIAGNOSIS — S61412A Laceration without foreign body of left hand, initial encounter: Secondary | ICD-10-CM

## 2015-09-03 DIAGNOSIS — Z8659 Personal history of other mental and behavioral disorders: Secondary | ICD-10-CM | POA: Insufficient documentation

## 2015-09-03 DIAGNOSIS — K219 Gastro-esophageal reflux disease without esophagitis: Secondary | ICD-10-CM | POA: Diagnosis not present

## 2015-09-03 DIAGNOSIS — W06XXXA Fall from bed, initial encounter: Secondary | ICD-10-CM | POA: Diagnosis not present

## 2015-09-03 DIAGNOSIS — Z87891 Personal history of nicotine dependence: Secondary | ICD-10-CM | POA: Diagnosis not present

## 2015-09-03 DIAGNOSIS — I2510 Atherosclerotic heart disease of native coronary artery without angina pectoris: Secondary | ICD-10-CM | POA: Insufficient documentation

## 2015-09-03 DIAGNOSIS — Z79899 Other long term (current) drug therapy: Secondary | ICD-10-CM | POA: Insufficient documentation

## 2015-09-03 DIAGNOSIS — S01511A Laceration without foreign body of lip, initial encounter: Secondary | ICD-10-CM | POA: Insufficient documentation

## 2015-09-03 DIAGNOSIS — Z8546 Personal history of malignant neoplasm of prostate: Secondary | ICD-10-CM | POA: Insufficient documentation

## 2015-09-03 DIAGNOSIS — S0083XA Contusion of other part of head, initial encounter: Secondary | ICD-10-CM

## 2015-09-03 DIAGNOSIS — Z7902 Long term (current) use of antithrombotics/antiplatelets: Secondary | ICD-10-CM | POA: Diagnosis not present

## 2015-09-03 DIAGNOSIS — Y9289 Other specified places as the place of occurrence of the external cause: Secondary | ICD-10-CM | POA: Diagnosis not present

## 2015-09-03 MED ORDER — LIDOCAINE-EPINEPHRINE (PF) 2 %-1:200000 IJ SOLN
10.0000 mg | Freq: Once | INTRAMUSCULAR | Status: AC
  Administered 2015-09-03: 10 mg
  Filled 2015-09-03: qty 20
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
## 2015-09-03 NOTE — ED Notes (Signed)
Dr. Glick at bedside.  

## 2015-09-03 NOTE — ED Notes (Signed)
Discharge instructions reviewed with patient. Understanding verbalized. Patient discharged to waiting room while attempting to reach his spouse via phone.

## 2015-09-03 NOTE — ED Provider Notes (Signed)
CSN: [id number]     Arrival date & time 09/03/15  4650 History   None    Chief Complaint  Patient presents with  . Fall  . Lip Laceration     (Consider location/radiation/quality/duration/timing/severity/associated sxs/prior Treatment) Patient is a 81 y.o. male presenting with fall. The history is provided by the patient.  Fall  He fell out of bed and hit his head on a night table. There was no loss of consciousness. He suffered facial injuries and lacerations to his left wrist. Of note, he is taking clopidogrel. He denies chest, back, abdomen injury.  Past Medical History  Diagnosis Date  . GASTRIC POLYP 05/13/2009  . COLONIC POLYPS, ADENOMATOUS 01/14/2008  . HYPERLIPIDEMIA 06/01/2007  . ANEMIA, IRON DEFICIENCY 10/13/2008  . COAGULOPATHY, COUMADIN-INDUCED 01/14/2008  . ANXIETY 09/04/2008  . HYPERTENSION 06/01/2007  . ATRIAL FIBRILLATION, CHRONIC 01/14/2008    on warfarin since 2007  . UNSPECIFIED VENOUS INSUFFICIENCY 09/18/2008  . PLEURAL EFFUSION, LEFT 10/13/2008  . GERD 06/01/2007  . BARRETTS ESOPHAGUS 03/20/2009  . PROSTATE CANCER, HX OF 01/14/2008  . CATARACTS, BILATERAL, HX OF 01/14/2008  . CEREBROVASCULAR ACCIDENT, HX OF 08/18/2008  . PERSONAL HX COLONIC POLYPS 01/01/2010  . BASAL CELL CARCINOMA OF SKIN SITE UNSPECIFIED 09/28/2010  . Hyperglycemia   . Elevated PSA   . Restless leg syndrome   . Mitral regurgitation 06/13/2008    mitral valve replacement #20 St. Jude Biocor; Maze procedure by Dr. Amador Cunas  at [facility]  . CORONARY ARTERY DISEASE 06/01/2007    Cath 04/03/2008; Cath 11/01/1990  . Tachy-brady syndrome ([place]) 07/13/2008    medtronic Adapta gen. model ADDR01 ,serial # [id number] H by Dr  [name] at [facility]  . Peripheral artery disease ([place]) 06/16/2010    LEA doppler-left ABI nml 0.61 calcified vessels;right ABI  0.68  . Edema, peripheral 07/30/2009    LEV doppler- no thrombus or thrombophlebitis  . Aortic valvular disorder 08/16/2012    echo EF 45-50% LV mildly  reduce,Biocor mitral valve,mild to mod. tricuspid regrug.,mild to mod. aortic regrug.  . Mitral valve disorder 09/08/2011    echo EF 50-55% LV normal  . Aortic valvular stenosis 11/19/2013  . Cardiomyopathy, ischemic 11/19/2013    EF 45-50% with inferior wall hypokinesis by echo 2014  . S/P CABG x 1 06/13/2008    SVG to LAD with open vein harvest right thigh, LIMA harvested but not utilized by Dr Amador Cunas  . S/P mitral valve replacement with bioprosthetic valve 06/13/2008    72mm St Jude Biocor porcine bioprosthetic tissue valve by Dr Amador Cunas  . S/P Maze operation for atrial fibrillation 06/13/2008    Partial left atrial lesion set using cryothermy for bilateral pulmonary vein isolation by Dr Amador Cunas  . Prosthetic valve dysfunction     Mitral stenosis involving bioprosthetic tissue valve placed 06/13/2008  . Mitral stenosis     Porcine bioprosthetic tissue valve placed 06/13/2008  . Dysrhythmia     HX OF TACHY BRADY  . Pacemaker   . HYPERTHYROIDISM 01/06/2011  . 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 Prostatectomy    . Tonsillectomy    . Cystectomy      Left leg  . Pacemaker placement  07/13/2008    [place] dual chamber-Medtronic at [facility]   . Coronary artery bypass graft  06/13/2008    graft x1 w/vein  graft LAD artery by Dr. Amador Cunas at [facility]  . Mitral valve replacement  06/13/2008    #20 St. Jude Bicor mitral valve ;Maze procedure at [facility] by Dr. Amador Cunas  . Cardiac catheterization  11/01/1990  . Skin cancer excision      removed from forehead and right leg, nose/face  . Upper gastrointestinal endoscopy  07/14/2009    erosive reflux esopagitis, Barrett's esophagus  . Colonoscopy  07/14/2009    diverticulosis, internal hemorrhoids  . Cardiovascular stress test  06/03/2010    Persantine perfusion EF 45% mild to moderate  ischemia basal and mid inferolateral region  . Cardiovascular stress test  01/01/2008    left ventricle normal,no significant ischemia  . Cardiovascular stress test  09/09/2005     possibility of ischemia inferior wall  . Tee without cardioversion N/A 01/22/2014    Procedure: TRANSESOPHAGEAL ECHOCARDIOGRAM (TEE);  Surgeon: Sanda Klein, MD;  Location: [facility] ENDOSCOPY;  Service: Cardiovascular;  Laterality: N/A;  . Coronary stent placement  03/24/2014    DES to LAD  . Transcatheter aortic valve replacement, transfemoral N/A 04/22/2014    Procedure: TRANSCATHETER AORTIC VALVE REPLACEMENT, TRANSFEMORAL;  Surgeon: [name], MD;  Location: [place];  Service: Open Heart Surgery;  Laterality: N/A;  TAVR-TF (RIGHT SIDE)  . Intraoperative transesophageal echocardiogram N/A 04/22/2014    Procedure: INTRAOPERATIVE TRANSTHORACIC ECHOCARDIOGRAM;  Surgeon: [name], MD;  Location[place];  Service: Open Heart Surgery;  Laterality: N/A;  . Left and right heart catheterization with coronary angiogram N/A 01/08/2014    Procedure: LEFT AND RIGHT HEART CATHETERIZATION WITH CORONARY ANGIOGRAM;  Surgeon: Sanda Klein, MD;  Location: [place] CATH LAB;  Service: Cardiovascular;  Laterality: N/A;  . Percutaneous coronary rotoblator intervention (pci-r) N/A 03/24/2014    Procedure: PERCUTANEOUS CORONARY ROTOBLATOR INTERVENTION (PCI-R);  Surgeon: Blane Ohara, MD;  Location: [facility] CATH LAB;  Service: Cardiovascular;  Laterality: N/A;   Family History  Problem Relation Age of Onset  . Breast cancer Mother   . Stroke Mother   . Transient ischemic attack Mother    Social History  Substance Use Topics  . Smoking status: Former Smoker -- 2 years    Types: Pipe    Quit date: 11/07/1965  . Smokeless tobacco: Never Used     Comment: quit over 40 years ago, never smoked cigarettes  . Alcohol Use: No    Review of Systems  All other systems reviewed and are negative.     Allergies  Review of patient's allergies  indicates no known allergies.  Home Medications   Prior to Admission medications   Medication Sig Start Date End Date Taking? Authorizing Provider  clopidogrel (PLAVIX) 75 MG tablet Take 1 tablet (75 mg total) by mouth daily with breakfast. 12/10/14   Sanda Klein, MD  ferrous sulfate 325 (65 FE) MG tablet Take 325 mg by mouth daily with breakfast.    Historical Provider, MD  methimazole (TAPAZOLE) 5 MG tablet TAKE 1 TABLET (5 MG TOTAL) BY MOUTH EVERY MONDAY, WEDNESDAY, AND FRIDAY. 03/31/15   [name], MD  metoprolol succinate (TOPROL-XL) 50 MG 24 hr tablet TAKE 1 TABLET (50 MG TOTAL) BY MOUTH DAILY. TAKE WITH OR IMMEDIATELY FOLLOWING A MEAL. 02/23/15   [name] [name], MD  Multiple Vitamin (MULTIVITAMIN WITH MINERALS) TABS Take 1 tablet by mouth daily. Centrum Silver    Historical Provider, MD  pantoprazole (PROTONIX) 40 MG tablet Take 1 tablet (40 mg total) by mouth 2 (two) times daily. 12/30/14   Gatha Mayer, MD  simvastatin (  ZOCOR) 40 MG tablet Take 1 tablet (40 mg total) by mouth at bedtime. 06/15/15   [name], MD  Tamsulosin HCl (FLOMAX) 0.4 MG CAPS Take 0.4 mg by mouth every evening.     Historical Provider, MD   BP 158/82 mmHg  Pulse 70  Ht 5\' 10"  (1.778 m)  Wt 165 lb (74.844 kg)  BMI 23.68 kg/m2  SpO2 98% Physical Exam  Nursing note and vitals reviewed.  81 year old male, resting comfortably and in no acute distress. Vital signs are significant for hypertension. Oxygen saturation is 98%, which is normal. Head is normocephalic. PERRLA, EOMI. Oropharynx is clear. Ecchymosis is present on the right side of the forehead. There is a laceration of the left side of the upper lip - laceration is somewhat stellate and severely contused, and crosses the [place] border. No intraoral injury is seen. Neck is nontender without adenopathy or JVD. Back is nontender and there is no CVA tenderness. Lungs are clear without rales, wheezes, or rhonchi. Chest is nontender. Heart has regular  rate and rhythm without murmur. Abdomen is soft, flat, nontender without masses or hepatosplenomegaly and peristalsis is normoactive. Extremities have no cyanosis or edema, full range of motion is present. Skin tears are present to the dorsum of the left wrist but no swelling. There is full passive range of motion without significant pain. Skin is warm and dry without rash. Neurologic: Mental status is normal, cranial nerves are intact, there are no motor or sensory deficits.  ED Course  Procedures (including critical care time) LACERATION REPAIR Performed by: POEUM,PNTIR Authorized by: [email or web address] Consent: Verbal consent obtained. Risks and benefits: risks, benefits and alternatives were discussed Consent given by: patient Patient identity confirmed: provided demographic data Prepped and Draped in normal sterile fashion Wound explored  Laceration Location:  Upper lip  Laceration Length: 3.0 cm  No Foreign Bodies seen or palpated  Anesthesia: local infiltration  Local anesthetic: lidocaine 2% without epinephrine  Anesthetic total: 2 ml  Amount of cleaning: standard  Skin closure: Close   Number of sutures: 8  Technique:  simple interrupted with 5-0 Vicryl Rapide. Macerated skin edges were approximated as closely as possible. For median border was approximated as closely as possible.   Patient tolerance: Patient tolerated the procedure well with no immediate complications.   Imaging Review Dg Wrist Complete Left  09/03/2015  CLINICAL DATA:  Status post fall, with posterior left wrist lacerations and bruising. Initial encounter. EXAM: LEFT WRIST - COMPLETE 3+ VIEW COMPARISON:  None. FINDINGS: There is no evidence of fracture or dislocation. The carpal rows are intact, and demonstrate normal alignment. Mild degenerative change is noted at the first carpometacarpal joint. There is mild diffuse osteopenia of visualized osseous structures. Diffuse vascular calcifications are  seen. Known soft tissue lacerations are not well characterized on radiograph. No radiopaque foreign bodies are seen. IMPRESSION: 1. No evidence of fracture or dislocation. 2. Mild diffuse osteopenia of visualized osseous structures. 3. Diffuse vascular calcifications seen. Electronically Signed   By: Garald Balding M.D.   On: 09/03/2015 05:03   Ct Head Wo Contrast  09/03/2015  CLINICAL DATA:  Status post fall out of bed. Hit head. Laceration under the right orbit, and at the left upper lip. Concern for cervical spine injury. Initial encounter. EXAM: CT HEAD WITHOUT CONTRAST CT MAXILLOFACIAL WITHOUT CONTRAST CT CERVICAL SPINE WITHOUT CONTRAST TECHNIQUE: Multidetector CT imaging of the head, cervical spine, and maxillofacial structures were performed using the standard protocol without intravenous contrast. Multiplanar CT  image reconstructions of the cervical spine and maxillofacial structures were also generated. COMPARISON:  CT of the head, maxillofacial structures and cervical spine performed 07/06/2015 FINDINGS: CT HEAD FINDINGS There is no evidence of acute infarction, mass lesion, or intra- or extra-axial hemorrhage on CT. Prominence of the ventricles and sulci reflects moderate cortical volume loss. Mild cerebellar atrophy is noted. Scattered periventricular and subcortical white matter change likely reflects small vessel ischemic microangiopathy. Chronic lacunar infarcts are noted at the basal ganglia bilaterally. The brainstem and fourth ventricle are within normal limits. The cerebral hemispheres demonstrate grossly normal gray-white differentiation. No mass effect or midline shift is seen. There is no evidence of fracture; visualized osseous structures are unremarkable in appearance. The visualized portions of the orbits are within normal limits. The paranasal sinuses and mastoid air cells are well-aerated. Soft tissue swelling is noted overlying the right frontal calvarium. CT MAXILLOFACIAL FINDINGS  There is no evidence of acute fracture or dislocation. The maxilla and mandible appear intact. A minimally depressed fracture at the nasal bone appears to be chronic in nature. The visualized dentition demonstrates no acute abnormality. The orbits are intact bilaterally. The visualized paranasal sinuses and mastoid air cells are well-aerated. The soft tissue laceration is noted at the right side of the nose. The laceration at the left upper lip is not well characterized. The parapharyngeal fat planes are preserved. The nasopharynx, oropharynx and hypopharynx are unremarkable in appearance. The visualized portions of the valleculae and piriform sinuses are grossly unremarkable. The parotid and submandibular glands are within normal limits. No cervical lymphadenopathy is seen. CT CERVICAL SPINE FINDINGS There is no evidence of acute fracture or subluxation. Vertebral bodies demonstrate normal height. Multilevel disc space narrowing is noted along the cervical spine, with vacuum phenomenon at C5-C6, and grade 1 anterolisthesis of C7 on T1, reflecting underlying facet disease. Prevertebral soft tissues are within normal limits. Scattered small hypodensities are noted within the thyroid gland, likely benign given their size. A small right pleural effusion is noted. Scattered vascular calcifications are seen, including calcification at the carotid bifurcations. IMPRESSION: 1. No evidence of traumatic intracranial injury or fracture. 2. No evidence of acute fracture or dislocation at the maxillofacial structures. Minimally depressed fracture of the nasal bone appears to be chronic in nature. 3. No evidence of acute fracture or subluxation along the cervical spine. 4. Soft tissue swelling overlying the right frontal calvarium, and soft tissue laceration at the right side of the nose. 5. Moderate cortical volume loss and scattered small vessel ischemic microangiopathy. Chronic lacunar infarcts at the basal ganglia  bilaterally. 6. Mild degenerative change along the cervical spine. 7. Sub-centimeter thyroid nodule(s) noted, too small to characterize, but most likely benign. 8. Small right pleural effusion noted. 9. Scattered vascular calcifications noted, including at the carotid bifurcations bilaterally. Carotid ultrasound would be helpful for further evaluation, when and as deemed clinically appropriate. Electronically Signed   By: Garald Balding M.D.   On: 09/03/2015 05:40   Ct Cervical Spine Wo Contrast  09/03/2015  CLINICAL DATA:  Status post fall out of bed. Hit head. Laceration under the right orbit, and at the left upper lip. Concern for cervical spine injury. Initial encounter. EXAM: CT HEAD WITHOUT CONTRAST CT MAXILLOFACIAL WITHOUT CONTRAST CT CERVICAL SPINE WITHOUT CONTRAST TECHNIQUE: Multidetector CT imaging of the head, cervical spine, and maxillofacial structures were performed using the standard protocol without intravenous contrast. Multiplanar CT image reconstructions of the cervical spine and maxillofacial structures were also generated. COMPARISON:  CT of  the head, maxillofacial structures and cervical spine performed 07/06/2015 FINDINGS: CT HEAD FINDINGS There is no evidence of acute infarction, mass lesion, or intra- or extra-axial hemorrhage on CT. Prominence of the ventricles and sulci reflects moderate cortical volume loss. Mild cerebellar atrophy is noted. Scattered periventricular and subcortical white matter change likely reflects small vessel ischemic microangiopathy. Chronic lacunar infarcts are noted at the basal ganglia bilaterally. The brainstem and fourth ventricle are within normal limits. The cerebral hemispheres demonstrate grossly normal gray-white differentiation. No mass effect or midline shift is seen. There is no evidence of fracture; visualized osseous structures are unremarkable in appearance. The visualized portions of the orbits are within normal limits. The paranasal sinuses and  mastoid air cells are well-aerated. Soft tissue swelling is noted overlying the right frontal calvarium. CT MAXILLOFACIAL FINDINGS There is no evidence of acute fracture or dislocation. The maxilla and mandible appear intact. A minimally depressed fracture at the nasal bone appears to be chronic in nature. The visualized dentition demonstrates no acute abnormality. The orbits are intact bilaterally. The visualized paranasal sinuses and mastoid air cells are well-aerated. The soft tissue laceration is noted at the right side of the nose. The laceration at the left upper lip is not well characterized. The parapharyngeal fat planes are preserved. The nasopharynx, oropharynx and hypopharynx are unremarkable in appearance. The visualized portions of the valleculae and piriform sinuses are grossly unremarkable. The parotid and submandibular glands are within normal limits. No cervical lymphadenopathy is seen. CT CERVICAL SPINE FINDINGS There is no evidence of acute fracture or subluxation. Vertebral bodies demonstrate normal height. Multilevel disc space narrowing is noted along the cervical spine, with vacuum phenomenon at C5-C6, and grade 1 anterolisthesis of C7 on T1, reflecting underlying facet disease. Prevertebral soft tissues are within normal limits. Scattered small hypodensities are noted within the thyroid gland, likely benign given their size. A small right pleural effusion is noted. Scattered vascular calcifications are seen, including calcification at the carotid bifurcations. IMPRESSION: 1. No evidence of traumatic intracranial injury or fracture. 2. No evidence of acute fracture or dislocation at the maxillofacial structures. Minimally depressed fracture of the nasal bone appears to be chronic in nature. 3. No evidence of acute fracture or subluxation along the cervical spine. 4. Soft tissue swelling overlying the right frontal calvarium, and soft tissue laceration at the right side of the nose. 5. Moderate  cortical volume loss and scattered small vessel ischemic microangiopathy. Chronic lacunar infarcts at the basal ganglia bilaterally. 6. Mild degenerative change along the cervical spine. 7. Sub-centimeter thyroid nodule(s) noted, too small to characterize, but most likely benign. 8. Small right pleural effusion noted. 9. Scattered vascular calcifications noted, including at the carotid bifurcations bilaterally. Carotid ultrasound would be helpful for further evaluation, when and as deemed clinically appropriate. Electronically Signed   By: Garald Balding M.D.   On: 09/03/2015 05:40   Ct Maxillofacial Wo Cm  09/03/2015  CLINICAL DATA:  Status post fall out of bed. Hit head. Laceration under the right orbit, and at the left upper lip. Concern for cervical spine injury. Initial encounter. EXAM: CT HEAD WITHOUT CONTRAST CT MAXILLOFACIAL WITHOUT CONTRAST CT CERVICAL SPINE WITHOUT CONTRAST TECHNIQUE: Multidetector CT imaging of the head, cervical spine, and maxillofacial structures were performed using the standard protocol without intravenous contrast. Multiplanar CT image reconstructions of the cervical spine and maxillofacial structures were also generated. COMPARISON:  CT of the head, maxillofacial structures and cervical spine performed 07/06/2015 FINDINGS: CT HEAD FINDINGS There is no evidence  of acute infarction, mass lesion, or intra- or extra-axial hemorrhage on CT. Prominence of the ventricles and sulci reflects moderate cortical volume loss. Mild cerebellar atrophy is noted. Scattered periventricular and subcortical white matter change likely reflects small vessel ischemic microangiopathy. Chronic lacunar infarcts are noted at the basal ganglia bilaterally. The brainstem and fourth ventricle are within normal limits. The cerebral hemispheres demonstrate grossly normal gray-white differentiation. No mass effect or midline shift is seen. There is no evidence of fracture; visualized osseous structures are  unremarkable in appearance. The visualized portions of the orbits are within normal limits. The paranasal sinuses and mastoid air cells are well-aerated. Soft tissue swelling is noted overlying the right frontal calvarium. CT MAXILLOFACIAL FINDINGS There is no evidence of acute fracture or dislocation. The maxilla and mandible appear intact. A minimally depressed fracture at the nasal bone appears to be chronic in nature. The visualized dentition demonstrates no acute abnormality. The orbits are intact bilaterally. The visualized paranasal sinuses and mastoid air cells are well-aerated. The soft tissue laceration is noted at the right side of the nose. The laceration at the left upper lip is not well characterized. The parapharyngeal fat planes are preserved. The nasopharynx, oropharynx and hypopharynx are unremarkable in appearance. The visualized portions of the valleculae and piriform sinuses are grossly unremarkable. The parotid and submandibular glands are within normal limits. No cervical lymphadenopathy is seen. CT CERVICAL SPINE FINDINGS There is no evidence of acute fracture or subluxation. Vertebral bodies demonstrate normal height. Multilevel disc space narrowing is noted along the cervical spine, with vacuum phenomenon at C5-C6, and grade 1 anterolisthesis of C7 on T1, reflecting underlying facet disease. Prevertebral soft tissues are within normal limits. Scattered small hypodensities are noted within the thyroid gland, likely benign given their size. A small right pleural effusion is noted. Scattered vascular calcifications are seen, including calcification at the carotid bifurcations. IMPRESSION: 1. No evidence of traumatic intracranial injury or fracture. 2. No evidence of acute fracture or dislocation at the maxillofacial structures. Minimally depressed fracture of the nasal bone appears to be chronic in nature. 3. No evidence of acute fracture or subluxation along the cervical spine. 4. Soft tissue  swelling overlying the right frontal calvarium, and soft tissue laceration at the right side of the nose. 5. Moderate cortical volume loss and scattered small vessel ischemic microangiopathy. Chronic lacunar infarcts at the basal ganglia bilaterally. 6. Mild degenerative change along the cervical spine. 7. Sub-centimeter thyroid nodule(s) noted, too small to characterize, but most likely benign. 8. Small right pleural effusion noted. 9. Scattered vascular calcifications noted, including at the carotid bifurcations bilaterally. Carotid ultrasound would be helpful for further evaluation, when and as deemed clinically appropriate. Electronically Signed   By: Garald Balding M.D.   On: 09/03/2015 05:40   I have personally reviewed and evaluated these images as part of my medical decision-making.   MDM   Final diagnoses:  Fall from bed, initial encounter  Contusion of face, initial encounter  Laceration of [place]mil[place] border of upper lip without complication, initial encounter  Skin tear of hand without complication, left, initial encounter    Fall with facial injury and injury to left wrist. He will need sutures for his lip. He is sent for CT of head, cervical spine, maxillofacial and plain films of his left wrist. Old records are reviewed and he has a prior ED visits for similar fall. TDaP had been given in 2015.    Delora Fuel, MD 85/27/78 [id number]

## 2015-09-03 NOTE — ED Notes (Signed)
Patient transported to xray & then CT via stretcher. Ice pack applied to upper lip to stop bleeding. Denies pain.

## 2015-09-03 NOTE — Discharge Instructions (Signed)
Stitches will dissolve on their own. He do not have to see anyone to have them removed.  Laceration Care, Adult A laceration is a cut that goes through all of the layers of the skin and into the tissue that is right under the skin. Some lacerations heal on their own. Others need to be closed with stitches (sutures), staples, skin adhesive strips, or skin glue. Proper laceration care minimizes the risk of infection and helps the laceration to heal better. HOW TO CARE FOR YOUR LACERATION If sutures or staples were used:  Keep the wound clean and dry.  If you were given a bandage (dressing), you should change it at least one time per day or as told by your health care provider. You should also change it if it becomes wet or dirty.  Keep the wound completely dry for the first 24 hours or as told by your health care provider. After that time, you may shower or bathe. However, make sure that the wound is not soaked in water until after the sutures or staples have been removed.  Clean the wound one time each day or as told by your health care provider:  Wash the wound with soap and water.  Rinse the wound with water to remove all soap.  Pat the wound dry with a clean towel. Do not rub the wound.  After cleaning the wound, apply a thin layer of antibiotic ointmentas told by your health care provider. This will help to prevent infection and keep the dressing from sticking to the wound.  Have the sutures or staples removed as told by your health care provider. If skin adhesive strips were used:  Keep the wound clean and dry.  If you were given a bandage (dressing), you should change it at least one time per day or as told by your health care provider. You should also change it if it becomes dirty or wet.  Do not get the skin adhesive strips wet. You may shower or bathe, but be careful to keep the wound dry.  If the wound gets wet, pat it dry with a clean towel. Do not rub the wound.  Skin  adhesive strips fall off on their own. You may trim the strips as the wound heals. Do not remove skin adhesive strips that are still stuck to the wound. They will fall off in time. If skin glue was used:  Try to keep the wound dry, but you may briefly wet it in the shower or bath. Do not soak the wound in water, such as by swimming.  After you have showered or bathed, gently pat the wound dry with a clean towel. Do not rub the wound.  Do not do any activities that will make you sweat heavily until the skin glue has fallen off on its own.  Do not apply liquid, cream, or ointment medicine to the wound while the skin glue is in place. Using those may loosen the film before the wound has healed.  If you were given a bandage (dressing), you should change it at least one time per day or as told by your health care provider. You should also change it if it becomes dirty or wet.  If a dressing is placed over the wound, be careful not to apply tape directly over the skin glue. Doing that may cause the glue to be pulled off before the wound has healed.  Do not pick at the glue. The skin glue usually remains in  place for 5-10 days, then it falls off of the skin. General Instructions  Take over-the-counter and prescription medicines only as told by your health care provider.  If you were prescribed an antibiotic medicine or ointment, take or apply it as told by your doctor. Do not stop using it even if your condition improves.  To help prevent scarring, make sure to cover your wound with sunscreen whenever you are outside after stitches are removed, after adhesive strips are removed, or when glue remains in place and the wound is healed. Make sure to wear a sunscreen of at least 30 SPF.  Do not scratch or pick at the wound.  Keep all follow-up visits as told by your health care provider. This is important.  Check your wound every day for signs of infection. Watch for:  Redness, swelling, or  pain.  Fluid, blood, or pus.  Raise (elevate) the injured area above the level of your heart while you are sitting or lying down, if possible. SEEK MEDICAL CARE IF:  You received a tetanus shot and you have swelling, severe pain, redness, or bleeding at the injection site.  You have a fever.  A wound that was closed breaks open.  You notice a bad smell coming from your wound or your dressing.  You notice something coming out of the wound, such as wood or glass.  Your pain is not controlled with medicine.  You have increased redness, swelling, or pain at the site of your wound.  You have fluid, blood, or pus coming from your wound.  You notice a change in the color of your skin near your wound.  You need to change the dressing frequently due to fluid, blood, or pus draining from the wound.  You develop a new rash.  You develop numbness around the wound. SEEK IMMEDIATE MEDICAL CARE IF:  You develop severe swelling around the wound.  Your pain suddenly increases and is severe.  You develop painful lumps near the wound or on skin that is anywhere on your body.  You have a red streak going away from your wound.  The wound is on your hand or foot and you cannot properly move a finger or toe.  The wound is on your hand or foot and you notice that your fingers or toes look pale or bluish.   This information is not intended to replace advice given to you by your health care provider. Make sure you discuss any questions you have with your health care provider.   Document Released: 10/24/2005 Document Revised: 03/10/2015 Document Reviewed: 10/20/2014 Elsevier Interactive Patient Education 2016 [place] A contusion is a deep bruise. Contusions are the result of a blunt injury to tissues and muscle fibers under the skin. The injury causes bleeding under the skin. The skin overlying the contusion may turn blue, purple, or yellow. Minor injuries will give you a  painless contusion, but more severe contusions may stay painful and swollen for a few weeks.  CAUSES  This condition is usually caused by a blow, trauma, or direct force to an area of the body. SYMPTOMS  Symptoms of this condition include:  Swelling of the injured area.  Pain and tenderness in the injured area.  Discoloration. The area may have redness and then turn blue, purple, or yellow. DIAGNOSIS  This condition is diagnosed based on a physical exam and medical history. An X-ray, CT scan, or MRI may be needed to determine if there are any associated  injuries, such as broken bones (fractures). TREATMENT  Specific treatment for this condition depends on what area of the body was injured. In general, the best treatment for a contusion is resting, icing, applying pressure to (compression), and elevating the injured area. This is often called the RICE strategy. Over-the-counter anti-inflammatory medicines may also be recommended for pain control.  HOME CARE INSTRUCTIONS   Rest the injured area.  If directed, apply ice to the injured area:  Put ice in a plastic bag.  Place a towel between your skin and the bag.  Leave the ice on for 20 minutes, 2-3 times per day.  If directed, apply light compression to the injured area using an elastic bandage. Make sure the bandage is not wrapped too tightly. Remove and reapply the bandage as directed by your health care provider.  If possible, raise (elevate) the injured area above the level of your heart while you are sitting or lying down.  Take over-the-counter and prescription medicines only as told by your health care provider. SEEK MEDICAL CARE IF:  Your symptoms do not improve after several days of treatment.  Your symptoms get worse.  You have difficulty moving the injured area. SEEK IMMEDIATE MEDICAL CARE IF:   You have severe pain.  You have numbness in a hand or foot.  Your hand or foot turns pale or cold.   This information  is not intended to replace advice given to you by your health care provider. Make sure you discuss any questions you have with your health care provider.   Document Released: 08/03/2005 Document Revised: 07/15/2015 Document Reviewed: 03/11/2015 Elsevier Interactive Patient Education 2016 [employer]  Skin Tear Care A skin tear is a wound in which the top layer of skin has peeled off. This is a common problem with aging because the skin becomes thinner and more fragile as a person gets older. In addition, some medicines, such as oral corticosteroids, can lead to skin thinning if taken for long periods of time.  A skin tear is often repaired with tape or skin adhesive strips. This keeps the skin that has been peeled off in contact with the healthier skin beneath. Depending on the location of the wound, a bandage (dressing) may be applied over the tape or skin adhesive strips. Sometimes, during the healing process, the skin turns black and dies. Even when this happens, the torn skin acts as a good dressing until the skin underneath gets healthier and repairs itself. HOME CARE INSTRUCTIONS   Change dressings once per day or as directed by your caregiver.  Gently clean the skin tear and the area around the tear using saline solution or mild soap and water.  Do not rub the injured skin dry. Let the area air dry.  Apply petroleum jelly or an antibiotic cream or ointment to keep the tear moist. This will help the wound heal. Do not allow a scab to form.  If the dressing sticks before the next dressing change, moisten it with warm soapy water and gently remove it.  Protect the injured skin until it has healed.  Only take over-the-counter or prescription medicines as directed by your caregiver.  Take showers or baths using warm soapy water. Apply a new dressing after the shower or bath.  Keep all follow-up appointments as directed by your caregiver.  SEEK IMMEDIATE MEDICAL CARE IF:   You have  redness, swelling, or increasing pain in the skin tear.  You havepus coming from the skin tear.  You have chills.  You have a red streak that goes away from the skin tear.  You have a bad smell coming from the tear or dressing.  You have a fever or persistent symptoms for more than 2-3 days.  You have a fever and your symptoms suddenly get worse. MAKE SURE YOU:  Understand these instructions.  Will watch this condition.  Will get help right away if your child is not doing well or gets worse.   This information is not intended to replace advice given to you by your health care provider. Make sure you discuss any questions you have with your health care provider.   Document Released: 07/19/2001 Document Revised: 07/18/2012 Document Reviewed: 05/07/2012 Elsevier Interactive Patient Education 2016 [place] in the Home  Falls can cause injuries and can affect people from all age groups. There are many simple things that you can do to make your home safe and to help prevent falls. WHAT CAN I DO ON THE OUTSIDE OF MY HOME?  Regularly repair the edges of walkways and driveways and fix any cracks.  Remove high doorway thresholds.  Trim any shrubbery on the main path into your home.  Use bright outdoor lighting.  Clear walkways of debris and clutter, including tools and rocks.  Regularly check that handrails are securely fastened and in good repair. Both sides of any steps should have handrails.  Install guardrails along the edges of any raised decks or porches.  Have leaves, snow, and ice cleared regularly.  Use sand or salt on walkways during winter months.  In the garage, clean up any spills right away, including grease or oil spills. WHAT CAN I DO IN THE BATHROOM?  Use night lights.  Install grab bars by the toilet and in the tub and shower. Do not use towel bars as grab bars.  Use non-skid mats or decals on the floor of the tub or shower.  If  you need to sit down while you are in the shower, use a plastic, non-slip stool.[employer] [employer]  Keep the floor dry. Immediately clean up any water that spills on the floor.  Remove soap buildup in the tub or shower on a regular basis.  Attach bath mats securely with double-sided non-slip rug tape.  Remove throw rugs and other tripping hazards from the floor. WHAT CAN I DO IN THE BEDROOM?  Use night lights.  Make sure that a bedside light is easy to reach.  Do not use oversized bedding that drapes onto the floor.  Have a firm chair that has side arms to use for getting dressed.  Remove throw rugs and other tripping hazards from the floor. WHAT CAN I DO IN THE KITCHEN?   Clean up any spills right away.  Avoid walking on wet floors.  Place frequently used items in easy-to-reach places.  If you need to reach for something above you, use a sturdy step stool that has a grab bar.  Keep electrical cables out of the way.  Do not use floor polish or wax that makes floors slippery. If you have to use wax, make sure that it is non-skid floor wax.  Remove throw rugs and other tripping hazards from the floor. WHAT CAN I DO IN THE STAIRWAYS?  Do not leave any items on the stairs.  Make sure that there are handrails on both sides of the stairs. Fix handrails that are broken or loose. Make sure that handrails are as long as the stairways.  Check any carpeting to make sure that it is firmly attached to the stairs. Fix any carpet that is loose or worn.  Avoid having throw rugs at the top or bottom of stairways, or secure the rugs with carpet tape to prevent them from moving.  Make sure that you have a light switch at the top of the stairs and the bottom of the stairs. If you do not have them, have them installed. WHAT ARE SOME OTHER FALL PREVENTION TIPS?  Wear closed-toe shoes that fit well and support your feet. Wear shoes that have rubber soles or low heels.  When you use a stepladder, make sure  that it is completely opened and that the sides are firmly locked. Have someone hold the ladder while you are using it. Do not climb a closed stepladder.  Add color or contrast paint or tape to grab bars and handrails in your home. Place contrasting color strips on the first and last steps.  Use mobility aids as needed, such as canes, walkers, scooters, and crutches.  Turn on lights if it is dark. Replace any light bulbs that burn out.  Set up furniture so that there are clear paths. Keep the furniture in the same spot.  Fix any uneven floor surfaces.  Choose a carpet design that does not hide the edge of steps of a stairway.  Be aware of any and all pets.  Review your medicines with your healthcare provider. Some medicines can cause dizziness or changes in blood pressure, which increase your risk of falling. Talk with your health care provider about other ways that you can decrease your risk of falls. This may include working with a physical therapist or trainer to improve your strength, balance, and endurance.   This information is not intended to replace advice given to you by your health care provider. Make sure you discuss any questions you have with your health care provider.   Document Released: 10/14/2002 Document Revised: 03/10/2015 Document Reviewed: 11/28/2014 Elsevier Interactive Patient Education [employer].

## 2015-09-09 LAB — TSH: TSH: 1.658 u[IU]/mL (ref 0.350–4.500)

## 2015-09-09 LAB — T4, FREE: Free T4: 1.18 ng/dL (ref 0.80–1.80)

## 2015-09-23 ENCOUNTER — Encounter: Payer: Self-pay | Admitting: Podiatry

## 2015-09-23 ENCOUNTER — Ambulatory Visit (INDEPENDENT_AMBULATORY_CARE_PROVIDER_SITE_OTHER): Payer: Medicare Other | Admitting: Podiatry

## 2015-09-23 DIAGNOSIS — B351 Tinea unguium: Secondary | ICD-10-CM | POA: Diagnosis not present

## 2015-09-23 DIAGNOSIS — M79675 Pain in left toe(s): Secondary | ICD-10-CM

## 2015-09-23 DIAGNOSIS — M79674 Pain in right toe(s): Secondary | ICD-10-CM | POA: Diagnosis not present

## 2015-09-23 DIAGNOSIS — Q828 Other specified congenital malformations of skin: Secondary | ICD-10-CM | POA: Diagnosis not present

## 2015-09-23 NOTE — Progress Notes (Signed)
Patient ID: [name] [name], male   DOB: 01-28-1929, 81 y.o.   MRN: [id number]  Subjective: This patient presents today complaining of painful toenails walking wearing shoes and is requesting nail debridement. Also, patient is complaining of a painful nucleated plantar lesion on the left foot  Objective: No open skin lesions bilaterally The toenails are elongated, hypertrophic, discolored, brittle and tender direct palpation 6-10 Nucleated keratoses plantar left heel Small punctate keratoses right heel  Assessment: Symptomatic onychomycoses 6-10 Porokeratosis 1  Plan: Debrided toenails 10 mechanically and electrically without any bleeding Debridement porokeratosis plantar left and packed with salinocaine  Reappoint 3 months

## 2015-09-25 ENCOUNTER — Other Ambulatory Visit: Payer: Self-pay

## 2015-09-25 ENCOUNTER — Ambulatory Visit (HOSPITAL_COMMUNITY): Payer: Medicare Other | Attending: Cardiology

## 2015-09-25 DIAGNOSIS — I10 Essential (primary) hypertension: Secondary | ICD-10-CM | POA: Diagnosis not present

## 2015-09-25 DIAGNOSIS — I351 Nonrheumatic aortic (valve) insufficiency: Secondary | ICD-10-CM | POA: Insufficient documentation

## 2015-09-25 DIAGNOSIS — I77810 Thoracic aortic ectasia: Secondary | ICD-10-CM | POA: Insufficient documentation

## 2015-09-25 DIAGNOSIS — E785 Hyperlipidemia, unspecified: Secondary | ICD-10-CM | POA: Insufficient documentation

## 2015-09-25 DIAGNOSIS — I517 Cardiomegaly: Secondary | ICD-10-CM | POA: Insufficient documentation

## 2015-09-25 DIAGNOSIS — Z952 Presence of prosthetic heart valve: Secondary | ICD-10-CM | POA: Diagnosis not present

## 2015-10-15 ENCOUNTER — Other Ambulatory Visit: Payer: Self-pay | Admitting: Cardiovascular Disease

## 2015-10-15 ENCOUNTER — Telehealth: Payer: Self-pay | Admitting: Internal Medicine

## 2015-10-15 MED ORDER — METOPROLOL SUCCINATE ER 50 MG PO TB24: 50.0000 mg | ORAL_TABLET | Freq: Every day | ORAL | Status: DC

## 2015-10-15 MED ORDER — CLOPIDOGREL BISULFATE 75 MG PO TABS: 75.0000 mg | ORAL_TABLET | Freq: Every day | ORAL | Status: DC

## 2015-10-15 MED ORDER — PANTOPRAZOLE SODIUM 40 MG PO TBEC: 40.0000 mg | DELAYED_RELEASE_TABLET | Freq: Two times a day (BID) | ORAL | Status: DC

## 2015-10-15 NOTE — Telephone Encounter (Signed)
Rx(s) sent to pharmacy electronically.  

## 2015-10-15 NOTE — Telephone Encounter (Signed)
°*  STAT* If patient is at the pharmacy, call can be transferred to refill team.   1. Which medications need to be refilled? (please list name of each medication and dose if known) Metoprolol, and Plavix   2. Which pharmacy/location (including street and city if local pharmacy) is medication to be sent to?[place]  3. Do they need a 30 day or 90 day supply? [place]

## 2015-10-15 NOTE — Telephone Encounter (Signed)
Refill sent in as requested. 

## 2015-11-11 ENCOUNTER — Encounter: Payer: Self-pay | Admitting: Endocrinology

## 2015-11-11 ENCOUNTER — Ambulatory Visit (INDEPENDENT_AMBULATORY_CARE_PROVIDER_SITE_OTHER): Payer: Medicare Other | Admitting: Endocrinology

## 2015-11-11 VITALS — BP 134/84 | HR 73 | Temp 97.6°F | Ht 70.0 in | Wt 171.0 lb

## 2015-11-11 DIAGNOSIS — D509 Iron deficiency anemia, unspecified: Secondary | ICD-10-CM | POA: Diagnosis not present

## 2015-11-11 DIAGNOSIS — R202 Paresthesia of skin: Secondary | ICD-10-CM | POA: Diagnosis not present

## 2015-11-11 LAB — CBC WITH DIFFERENTIAL/PLATELET
BASOS PCT: 0.5 % (ref 0.0–3.0)
Basophils Absolute: 0 10*3/uL (ref 0.0–0.1)
EOS ABS: 0.2 10*3/uL (ref 0.0–0.7)
EOS PCT: 3.3 % (ref 0.0–5.0)
HCT: 39.8 % (ref 39.0–52.0)
Hemoglobin: 13.1 g/dL (ref 13.0–17.0)
LYMPHS ABS: 1.5 10*3/uL (ref 0.7–4.0)
Lymphocytes Relative: 24.8 % (ref 12.0–46.0)
MCHC: 32.8 g/dL (ref 30.0–36.0)
MCV: 92.3 fl (ref 78.0–100.0)
MONO ABS: 0.6 10*3/uL (ref 0.1–1.0)
Monocytes Relative: 9.8 % (ref 3.0–12.0)
NEUTROS ABS: 3.7 10*3/uL (ref 1.4–7.7)
NEUTROS PCT: 61.6 % (ref 43.0–77.0)
PLATELETS: 142 10*3/uL — AB (ref 150.0–400.0)
RBC: 4.32 Mil/uL (ref 4.22–5.81)
RDW: 14.3 % (ref 11.5–15.5)
WBC: 5.9 10*3/uL (ref 4.0–10.5)

## 2015-11-11 LAB — IBC PANEL
IRON: 92 ug/dL (ref 42–165)
Saturation Ratios: 30.6 % (ref 20.0–50.0)
Transferrin: 215 mg/dL (ref 212.0–360.0)

## 2015-11-11 LAB — VITAMIN B12: Vitamin B-12: 623 pg/mL (ref 211–911)

## 2015-11-11 NOTE — Progress Notes (Signed)
Subjective:    Patient ID: [name] [name], male    DOB: [birth year], 82 y.o.   MRN: [id number]  HPI Pt states 6 weeks of moderate burning of the feet, but no assoc numbness.   Past Medical History  Diagnosis Date  . GASTRIC POLYP 05/13/2009  . COLONIC POLYPS, ADENOMATOUS 01/14/2008  . HYPERLIPIDEMIA 06/01/2007  . ANEMIA, IRON DEFICIENCY 10/13/2008  . COAGULOPATHY, COUMADIN-INDUCED 01/14/2008  . ANXIETY 09/04/2008  . HYPERTENSION 06/01/2007  . ATRIAL FIBRILLATION, CHRONIC 01/14/2008    on warfarin since 2007  . UNSPECIFIED VENOUS INSUFFICIENCY 09/18/2008  . PLEURAL EFFUSION, LEFT 10/13/2008  . GERD 06/01/2007  . BARRETTS ESOPHAGUS 03/20/2009  . PROSTATE CANCER, HX OF 01/14/2008  . CATARACTS, BILATERAL, HX OF 01/14/2008  . CEREBROVASCULAR ACCIDENT, HX OF 08/18/2008  . PERSONAL HX COLONIC POLYPS 01/01/2010  . BASAL CELL CARCINOMA OF SKIN SITE UNSPECIFIED 09/28/2010  . Hyperglycemia   . Elevated PSA   . Restless leg syndrome   . Mitral regurgitation 06/13/2008    mitral valve replacement #20 St. Jude Biocor; Maze procedure by Dr. Amador Cunas  at [facility]  . CORONARY ARTERY DISEASE 06/01/2007    Cath 04/03/2008; Cath 11/01/1990  . Tachy-brady syndrome ([place]) 07/13/2008    medtronic Adapta gen. model ADDR01 ,serial # [id number] H by Dr  [name] at [facility]  . Peripheral artery disease ([place]) 06/16/2010    LEA doppler-left ABI nml 0.61 calcified vessels;right ABI  0.68  . Edema, peripheral 07/30/2009    LEV doppler- no thrombus or thrombophlebitis  . Aortic valvular disorder 08/16/2012    echo EF 45-50% LV mildly reduce,Biocor mitral valve,mild to mod. tricuspid regrug.,mild to mod. aortic regrug.  . Mitral valve disorder 09/08/2011    echo EF 50-55% LV normal  . Aortic valvular stenosis 11/19/2013  . Cardiomyopathy, ischemic 11/19/2013    EF 45-50% with inferior wall hypokinesis by echo 2014  . S/P CABG x 1 06/13/2008    SVG to LAD with open vein harvest right thigh, LIMA harvested but not utilized by Dr  Amador Cunas  . S/P mitral valve replacement with bioprosthetic valve 06/13/2008    52mm St Jude Biocor porcine bioprosthetic tissue valve by Dr Amador Cunas  . S/P Maze operation for atrial fibrillation 06/13/2008    Partial left atrial lesion set using cryothermy for bilateral pulmonary vein isolation by Dr Amador Cunas  . Prosthetic valve dysfunction     Mitral stenosis involving bioprosthetic tissue valve placed 06/13/2008  . Mitral stenosis     Porcine bioprosthetic tissue valve placed 06/13/2008  . Dysrhythmia     HX OF TACHY BRADY  . Pacemaker   . HYPERTHYROIDISM 01/06/2011  . 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 Prostatectomy    . Tonsillectomy    . Cystectomy      Left leg  . Pacemaker placement  07/13/2008    Adapata dual chamber-Medtronic at [facility]   . Coronary artery bypass graft  06/13/2008    graft x1 w/vein graft LAD artery by Dr. Amador Cunas at [facility]  . Mitral valve replacement  06/13/2008    #20 St. Jude Bicor mitral valve ;Maze procedure at [facility] by Dr. Amador Cunas  . Cardiac catheterization  11/01/1990  . Skin cancer excision      removed from forehead and right leg, nose/face  . Upper gastrointestinal endoscopy  07/14/2009  erosive reflux esopagitis, Barrett's esophagus  . Colonoscopy  07/14/2009    diverticulosis, internal hemorrhoids  . Cardiovascular stress test  06/03/2010    Persantine perfusion EF 45% mild to moderate ischemia basal and mid inferolateral region  . Cardiovascular stress test  01/01/2008    left ventricle normal,no significant ischemia  . Cardiovascular stress test  09/09/2005     possibility of ischemia inferior wall  . Tee without cardioversion N/A 01/22/2014    Procedure: TRANSESOPHAGEAL ECHOCARDIOGRAM (TEE);  Surgeon: Sanda Klein, MD;  Location: [facility] ENDOSCOPY;  Service: Cardiovascular;   Laterality: N/A;  . Coronary stent placement  03/24/2014    DES to LAD  . Transcatheter aortic valve replacement, transfemoral N/A 04/22/2014    Procedure: TRANSCATHETER AORTIC VALVE REPLACEMENT, TRANSFEMORAL;  Surgeon: [name], MD;  Location: [place];  Service: Open Heart Surgery;  Laterality: N/A;  TAVR-TF (RIGHT SIDE)  . Intraoperative transesophageal echocardiogram N/A 04/22/2014    Procedure: INTRAOPERATIVE TRANSTHORACIC ECHOCARDIOGRAM;  Surgeon: [name], MD;  Location: [place];  Service: Open Heart Surgery;  Laterality: N/A;  . Left and right heart catheterization with coronary angiogram N/A 01/08/2014    Procedure: LEFT AND RIGHT HEART CATHETERIZATION WITH CORONARY ANGIOGRAM;  Surgeon: Sanda Klein, MD;  Location: [place] CATH LAB;  Service: Cardiovascular;  Laterality: N/A;  . Percutaneous coronary rotoblator intervention (pci-r) N/A 03/24/2014    Procedure: PERCUTANEOUS CORONARY ROTOBLATOR INTERVENTION (PCI-R);  Surgeon: Blane Ohara, MD;  Location: [facility] CATH LAB;  Service: Cardiovascular;  Laterality: N/A;    Social History   Social History  . Marital Status: Married    Spouse Name: N/A  . Number of Children: 4  . Years of Education: N/A   Occupational History  . Retired    Social History Main Topics  . Smoking status: Former Smoker -- 2 years    Types: Pipe    Quit date: 11/07/1965  . Smokeless tobacco: Never Used     Comment: quit over 40 years ago, never smoked cigarettes  . Alcohol Use: No  . Drug Use: No  . Sexual Activity: Not on file   Other Topics Concern  . Not on file   Social History Narrative    Current Outpatient Prescriptions on File Prior to Visit  Medication Sig Dispense Refill  . clopidogrel (PLAVIX) 75 MG tablet Take 1 tablet (75 mg total) by mouth daily with breakfast. 90 tablet 3  . ferrous sulfate 325 (65 FE) MG tablet Take 325 mg by mouth daily with breakfast.    . methimazole (TAPAZOLE) 5 MG tablet TAKE 1 TABLET (5 MG TOTAL) BY MOUTH  EVERY MONDAY, WEDNESDAY, AND FRIDAY. 36 tablet 2  . metoprolol succinate (TOPROL-XL) 50 MG 24 hr tablet Take 1 tablet (50 mg total) by mouth daily. Take with or immediately following a meal. 90 tablet 3  . Multiple Vitamin (MULTIVITAMIN WITH MINERALS) TABS Take 1 tablet by mouth daily. Centrum Silver    . pantoprazole (PROTONIX) 40 MG tablet Take 1 tablet (40 mg total) by mouth 2 (two) times daily. 180 tablet 1  . simvastatin (ZOCOR) 40 MG tablet Take 1 tablet (40 mg total) by mouth at bedtime. 90 tablet 2  . Tamsulosin HCl (FLOMAX) 0.4 MG CAPS Take 0.4 mg by mouth every evening.      No current facility-administered medications on file prior to visit.    No Known Allergies  Family History  Problem Relation Age of Onset  . Breast cancer Mother   . Stroke Mother   .  Transient ischemic attack Mother     BP 134/84 mmHg  Pulse 73  Temp(Src) 97.6 F (36.4 C) (Oral)  Ht 5\' 10"  (1.778 m)  Wt 171 lb (77.565 kg)  BMI 24.54 kg/m2  SpO2 96%    Review of Systems Denies weight change.      Objective:   Physical Exam VITAL SIGNS:  See vs page GENERAL: no distress Pulses: dorsalis pedis intact bilat.   MSK: no deformity of the feet CV: 1+ bilat leg edema Skin:  no ulcer on the feet, but the skin is dry.  normal temp on the feet, but there is bilat patchy hyperpigmentation and cyanosis. Neuro: sensation is intact to touch on the feet Ext: There is bilateral onychomycosis of the toenails.      Lab Results  Component Value Date   TSH 1.658 09/08/2015   Lab Results  Component Value Date   WBC 5.9 11/11/2015   HGB 13.1 11/11/2015   HCT 39.8 11/11/2015   MCV 92.3 11/11/2015   PLT 142.0* 11/11/2015       Assessment & Plan:  Anemia, better.  He can d/c Fe tabs Paresthesias: new, uncertain etiology Hyperthyroidism: well-controlled.  Patient is advised the following: Patient Instructions  blood tests are requested for you today.  We'll let you know about the results. Let's  check a nerve-ending test (done at a neurologist office).  you will receive a phone call, about a day and time for an appointment. To help the symptoms, try applying "mineral ice" cream to your feet.   Please come back for a regular physical appointment in 2 months.

## 2015-11-11 NOTE — Patient Instructions (Addendum)
blood tests are requested for you today.  We'll let you know about the results. Let's check a nerve-ending test (done at a neurologist office).  you will receive a phone call, about a day and time for an appointment. To help the symptoms, try applying "mineral ice" cream to your feet.   Please come back for a regular physical appointment in 2 months.

## 2015-11-12 ENCOUNTER — Telehealth: Payer: Self-pay | Admitting: Endocrinology

## 2015-11-12 NOTE — Telephone Encounter (Signed)
please call patient: As the anemia is better, you can d/c the iron pills.

## 2015-11-13 NOTE — Telephone Encounter (Signed)
Pt advised of note below and voiced understanding.  

## 2015-11-24 ENCOUNTER — Ambulatory Visit (INDEPENDENT_AMBULATORY_CARE_PROVIDER_SITE_OTHER): Payer: Medicare Other | Admitting: *Deleted

## 2015-11-24 ENCOUNTER — Telehealth: Payer: Self-pay | Admitting: Cardiology

## 2015-11-24 DIAGNOSIS — I495 Sick sinus syndrome: Secondary | ICD-10-CM | POA: Diagnosis not present

## 2015-11-24 NOTE — Telephone Encounter (Signed)
LMOVM reminding pt to send remote transmission.   

## 2015-11-25 ENCOUNTER — Telehealth: Payer: Self-pay | Admitting: Cardiovascular Disease

## 2015-11-25 NOTE — Telephone Encounter (Signed)
New Message  4. Are you calling to see if we received your device transmission? YES

## 2015-11-25 NOTE — Progress Notes (Signed)
Remote pacemaker transmission.   

## 2015-11-25 NOTE — Telephone Encounter (Signed)
Pt wanted to know if he could send his remote transmission today b/c he was unable to do so yesterday. Informed pt that it is ok for him to send the transmission today.

## 2015-11-27 ENCOUNTER — Other Ambulatory Visit: Payer: Self-pay | Admitting: *Deleted

## 2015-11-27 ENCOUNTER — Encounter: Payer: Self-pay | Admitting: Cardiology

## 2015-11-27 DIAGNOSIS — R202 Paresthesia of skin: Secondary | ICD-10-CM

## 2015-11-27 LAB — CUP PACEART REMOTE DEVICE CHECK
Battery Remaining Longevity: 26 mo
Battery Voltage: 2.74 V
Brady Statistic AP VS Percent: 0 %
Brady Statistic AS VP Percent: 1 %
Brady Statistic AS VS Percent: 99 %
Implantable Lead Implant Date: 20090906
Implantable Lead Implant Date: 20090906
Implantable Lead Location: 753859
Implantable Lead Model: 5076
Lead Channel Impedance Value: 426 Ohm
Lead Channel Impedance Value: 802 Ohm
Lead Channel Setting Pacing Amplitude: 2 V
Lead Channel Setting Pacing Amplitude: 2.5 V
MDC IDC LEAD LOCATION: 753860
MDC IDC MSMT BATTERY IMPEDANCE: 2322 Ohm
MDC IDC SESS DTM: 20170118161341
MDC IDC SET LEADCHNL RV PACING PULSEWIDTH: 0.4 ms
MDC IDC SET LEADCHNL RV SENSING SENSITIVITY: 2.8 mV
MDC IDC STAT BRADY AP VP PERCENT: 0 %

## 2015-12-01 ENCOUNTER — Ambulatory Visit (INDEPENDENT_AMBULATORY_CARE_PROVIDER_SITE_OTHER): Payer: Medicare Other | Admitting: Neurology

## 2015-12-01 DIAGNOSIS — G609 Hereditary and idiopathic neuropathy, unspecified: Secondary | ICD-10-CM

## 2015-12-01 DIAGNOSIS — R202 Paresthesia of skin: Secondary | ICD-10-CM

## 2015-12-01 NOTE — Procedures (Signed)
[facility]  [address], [place]  [place], [place] [address] Tel: [phone] Fax:  [phone] Test Date:  12/01/2015  Patient: [name] [name] DOB: 1929-01-01 Physician: Narda Amber  Sex: Male Height: 5\' 10"  Ref Phys: [name], M.D.  ID#: [id number] Temp: 33.3C Technician: Jerilynn Mages. Dean   Patient Complaints: This is a 82 year-old gentleman referred for evaluation of bilateral feet burning paresthesias.  NCV & EMG Findings: Extensive electrodiagnostic testing of the right lower extremity and additional studies of the left shows: 1. Bilateral sural and superficial peroneal sensory responses are absent. 2. Bilateral peroneal motor responses recording at the extensor digitorum brevis are symmetrically reduced; however, motor response at the tibialis anterior is within normal limits. Bilateral tibial motor responses are within normal limits. 3. Bilateral tibial H reflex studies are absent. 4. Chronic motor axon loss changes are seen affecting primarily the muscles below the knee and conform to a gradient pattern, without accompanied active denervation.  Impression: The electrophysiologic findings are most consistent with a chronic, length-dependent sensorimotor polyneuropathy, axon loss in type, affecting the lower extremities.   _____________________________ Narda Amber, D.O.    Nerve Conduction Studies Anti Sensory Summary Table   Stim Site NR Peak (ms) Norm Peak (ms) P-T Amp (V) Norm P-T Amp  Left Sup Peroneal Anti Sensory (Ant Lat Mall)  33.3C  12 cm NR  <4.6  >3  Right Sup Peroneal Anti Sensory (Ant Lat Mall)  33.3C  12 cm NR  <4.6  >3  Left Sural Anti Sensory (Lat Mall)  33.3C  Calf NR  <4.6  >3  Right Sural Anti Sensory (Lat Mall)  33.3C  Calf NR  <4.6  >3   Motor Summary Table   Stim Site NR Onset (ms) Norm Onset (ms) O-P Amp (mV) Norm O-P Amp Site1 Site2 Delta-0 (ms) Dist (cm) Vel (m/s) Norm Vel (m/s)  Left Peroneal Motor (Ext Dig Brev)  33.3C    Ankle    4.6 <6.0 1.3 >2.5 B Fib Ankle 8.1 34.0 42 >40  B Fib    12.7  1.1  Poplt B Fib 2.6 10.5 40 >40  Poplt    15.3  1.1         Right Peroneal Motor (Ext Dig Brev)  33.3C  Ankle    4.0 <6.0 1.3 >2.5 B Fib Ankle 7.6 32.0 42 >40  B Fib    11.6  1.1  Poplt B Fib 2.2 10.0 45 >40  Poplt    13.8  1.2         Left Peroneal TA Motor (Tib Ant)  33.3C  Fib Head    2.8 <4.5 3.0 >3 Poplit Fib Head 2.4 10.0 42 >40  Poplit    5.2  2.4         Right Peroneal TA Motor (Tib Ant)  33.3C  Fib Head    3.5 <4.5 3.4 >3 Poplit Fib Head 2.0 10.0 50 >40  Poplit    5.5  2.6         Left Tibial Motor (Abd Hall Brev)  33.3C  Ankle    4.8 <6.0 4.9 >4 Knee Ankle 10.2 42.0 41 >40  Knee    15.0  4.1         Right Tibial Motor (Abd Hall Brev)  33.3C  Ankle    5.1 <6.0 4.4 >4 Knee Ankle 10.6 44.0 42 >40  Knee    15.7  2.9  H Reflex Studies   NR H-Lat (ms) Lat Norm (ms) L-R H-Lat (ms)  Left Tibial (Gastroc)  33.3C  NR  <35   Right Tibial (Gastroc)  33.3C  NR  <35    EMG   Side Muscle Ins Act Fibs Psw Fasc Number Recrt Dur Dur. Amp Amp. Poly Poly. Comment  Right AntTibialis Nml Nml Nml Nml 1- Rapid Some 1+ Some 1+ Nml Nml N/A  Right Gastroc Nml Nml Nml Nml 2- Mod-R Some 1+ Some 1+ Some 1+ N/A  Right Flex Dig Long Nml Nml Nml Nml 2- Rapid Many 1+ Some 1+ Nml Nml N/A  Right RectFemoris Nml Nml Nml Nml 1- Mod-R Few 1+ Few 1+ Nml Nml N/A  Right BicepsFemS Nml Nml Nml Nml Nml Nml Nml Nml Nml Nml Nml Nml N/A  Right GluteusMed Nml Nml Nml Nml Nml Nml Nml Nml Nml Nml Nml Nml N/A  Right AdductorLong Nml Nml Nml Nml Nml Nml Nml Nml Nml Nml Nml Nml N/A  Left AntTibialis Nml Nml Nml Nml 1- Mod-R Some 1+ Some 1+ Nml Nml N/A  Left Gastroc Nml Nml Nml Nml 2- Mod-R Some 1+ Some 1+ Nml Nml N/A  Left RectFemoris Nml Nml Nml Nml Nml Nml Nml Nml Nml Nml Nml Nml N/A      Waveforms:

## 2015-12-07 ENCOUNTER — Telehealth: Payer: Self-pay | Admitting: Internal Medicine

## 2015-12-07 MED ORDER — PANTOPRAZOLE SODIUM 40 MG PO TBEC: 40.0000 mg | DELAYED_RELEASE_TABLET | Freq: Two times a day (BID) | ORAL | Status: DC

## 2015-12-07 NOTE — Telephone Encounter (Signed)
Spoke with patient and confirmed pharmacy and made him a follow up appointment for March 29th at 10:30AM with Dr Carlean Purl.  Sent in  Refill on his pantoprazole to cover until appointment.

## 2015-12-14 ENCOUNTER — Encounter: Payer: Self-pay | Admitting: Cardiology

## 2015-12-21 ENCOUNTER — Telehealth: Payer: Self-pay | Admitting: Internal Medicine

## 2015-12-21 MED ORDER — PANTOPRAZOLE SODIUM 40 MG PO TBEC: 40.0000 mg | DELAYED_RELEASE_TABLET | Freq: Two times a day (BID) | ORAL | Status: DC

## 2015-12-21 NOTE — Telephone Encounter (Signed)
Refill sent in as requested. Patient has appointment in March 2017.

## 2016-01-08 ENCOUNTER — Ambulatory Visit: Payer: Medicare Other | Admitting: Endocrinology

## 2016-01-11 ENCOUNTER — Telehealth: Payer: Self-pay | Admitting: Endocrinology

## 2016-01-11 NOTE — Telephone Encounter (Signed)
Caitlin, Could you contact the pt and reschedule the appointment for 1 month? Thanks!

## 2016-01-11 NOTE — Telephone Encounter (Signed)
Please come back for a regular physical appointment in 1 month.

## 2016-01-11 NOTE — Telephone Encounter (Signed)
Patient no showed today's appt. Please advise on how to follow up. °A. No follow up necessary. °B. Follow up urgent. Contact patient immediately. °C. Follow up necessary. Contact patient and schedule visit in ___ days. °D. Follow up advised. Contact patient and schedule visit in ____weeks. ° °

## 2016-01-12 ENCOUNTER — Ambulatory Visit (INDEPENDENT_AMBULATORY_CARE_PROVIDER_SITE_OTHER): Payer: Medicare Other | Admitting: Endocrinology

## 2016-01-12 ENCOUNTER — Encounter: Payer: Self-pay | Admitting: Endocrinology

## 2016-01-12 VITALS — BP 122/64 | HR 69 | Temp 97.7°F | Ht 70.0 in | Wt 169.0 lb

## 2016-01-12 DIAGNOSIS — Z0000 Encounter for general adult medical examination without abnormal findings: Secondary | ICD-10-CM | POA: Diagnosis not present

## 2016-01-12 DIAGNOSIS — R202 Paresthesia of skin: Secondary | ICD-10-CM | POA: Diagnosis not present

## 2016-01-12 MED ORDER — GABAPENTIN 100 MG PO CAPS: 100.0000 mg | ORAL_CAPSULE | Freq: Every day | ORAL | Status: DC

## 2016-01-12 NOTE — Progress Notes (Signed)
we discussed code status.  pt requests full code, but would not want to be started or maintained on artificial life-support measures if there was not a reasonable chance of recovery 

## 2016-01-12 NOTE — Progress Notes (Signed)
Subjective:    Patient ID: [name] [name], male    DOB: 1929/10/28, 82 y.o.   MRN: [id number]  HPI Pt states few years of moderate pain at the legs and feet, but no assoc numbness.  It is worst when he is sleeping.  Past Medical History  Diagnosis Date  . GASTRIC POLYP 05/13/2009  . COLONIC POLYPS, ADENOMATOUS 01/14/2008  . HYPERLIPIDEMIA 06/01/2007  . ANEMIA, IRON DEFICIENCY 10/13/2008  . COAGULOPATHY, COUMADIN-INDUCED 01/14/2008  . ANXIETY 09/04/2008  . HYPERTENSION 06/01/2007  . ATRIAL FIBRILLATION, CHRONIC 01/14/2008    on warfarin since 2007  . UNSPECIFIED VENOUS INSUFFICIENCY 09/18/2008  . PLEURAL EFFUSION, LEFT 10/13/2008  . GERD 06/01/2007  . BARRETTS ESOPHAGUS 03/20/2009  . PROSTATE CANCER, HX OF 01/14/2008  . CATARACTS, BILATERAL, HX OF 01/14/2008  . CEREBROVASCULAR ACCIDENT, HX OF 08/18/2008  . PERSONAL HX COLONIC POLYPS 01/01/2010  . BASAL CELL CARCINOMA OF SKIN SITE UNSPECIFIED 09/28/2010  . Hyperglycemia   . Elevated PSA   . Restless leg syndrome   . Mitral regurgitation 06/13/2008    mitral valve replacement #20 St. Jude Biocor; Maze procedure by Dr. Amador Cunas  at [facility]  . CORONARY ARTERY DISEASE 06/01/2007    Cath 04/03/2008; Cath 11/01/1990  . Tachy-brady syndrome ([place]) 07/13/2008    medtronic Adapta gen. model ADDR01 ,serial # [id number] H by Dr  [name] at [facility]  . Peripheral artery disease ([place]) 06/16/2010    LEA doppler-left ABI nml 0.61 calcified vessels;right ABI  0.68  . Edema, peripheral 07/30/2009    LEV doppler- no thrombus or thrombophlebitis  . Aortic valvular disorder 08/16/2012    echo EF 45-50% LV mildly reduce,Biocor mitral valve,mild to mod. tricuspid regrug.,mild to mod. aortic regrug.  . Mitral valve disorder 09/08/2011    echo EF 50-55% LV normal  . Aortic valvular stenosis 11/19/2013  . Cardiomyopathy, ischemic 11/19/2013    EF 45-50% with inferior wall hypokinesis by echo 2014  . S/P CABG x 1 06/13/2008    SVG to LAD with open vein harvest right  thigh, LIMA harvested but not utilized by Dr Amador Cunas  . S/P mitral valve replacement with bioprosthetic valve 06/13/2008    65mm St Jude Biocor porcine bioprosthetic tissue valve by Dr Amador Cunas  . S/P Maze operation for atrial fibrillation 06/13/2008    Partial left atrial lesion set using cryothermy for bilateral pulmonary vein isolation by Dr Amador Cunas  . Prosthetic valve dysfunction     Mitral stenosis involving bioprosthetic tissue valve placed 06/13/2008  . Mitral stenosis     Porcine bioprosthetic tissue valve placed 06/13/2008  . Dysrhythmia     HX OF TACHY BRADY  . Pacemaker   . HYPERTHYROIDISM 01/06/2011  . 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 Prostatectomy    . Tonsillectomy    . Cystectomy      Left leg  . Pacemaker placement  07/13/2008    Adapata dual chamber-Medtronic at [facility]   . Coronary artery bypass graft  06/13/2008    graft x1 w/vein graft LAD artery by Dr. Amador Cunas at [facility]  . Mitral valve replacement  06/13/2008    #20 St. Jude Bicor mitral valve ;Maze procedure at [facility] by Dr. Amador Cunas  . Cardiac catheterization  11/01/1990  . Skin cancer excision      removed from forehead and right  leg, nose/face  . Upper gastrointestinal endoscopy  07/14/2009    erosive reflux esopagitis, Barrett's esophagus  . Colonoscopy  07/14/2009    diverticulosis, internal hemorrhoids  . Cardiovascular stress test  06/03/2010    Persantine perfusion EF 45% mild to moderate ischemia basal and mid inferolateral region  . Cardiovascular stress test  01/01/2008    left ventricle normal,no significant ischemia  . Cardiovascular stress test  09/09/2005     possibility of ischemia inferior wall  . Tee without cardioversion N/A 01/22/2014    Procedure: TRANSESOPHAGEAL ECHOCARDIOGRAM (TEE);  Surgeon: Sanda Klein, MD;  Location: [facility]  ENDOSCOPY;  Service: Cardiovascular;  Laterality: N/A;  . Coronary stent placement  03/24/2014    DES to LAD  . Transcatheter aortic valve replacement, transfemoral N/A 04/22/2014    Procedure: TRANSCATHETER AORTIC VALVE REPLACEMENT, TRANSFEMORAL;  Surgeon: [name], MD;  Location: [place];  Service: Open Heart Surgery;  Laterality: N/A;  TAVR-TF (RIGHT SIDE)  . Intraoperative transesophageal echocardiogram N/A 04/22/2014    Procedure: INTRAOPERATIVE TRANSTHORACIC ECHOCARDIOGRAM;  Surgeon: [name], MD;  Location: [place];  Service: Open Heart Surgery;  Laterality: N/A;  . Left and right heart catheterization with coronary angiogram N/A 01/08/2014    Procedure: LEFT AND RIGHT HEART CATHETERIZATION WITH CORONARY ANGIOGRAM;  Surgeon: Sanda Klein, MD;  Location: [place] CATH LAB;  Service: Cardiovascular;  Laterality: N/A;  . Percutaneous coronary rotoblator intervention (pci-r) N/A 03/24/2014    Procedure: PERCUTANEOUS CORONARY ROTOBLATOR INTERVENTION (PCI-R);  Surgeon: Blane Ohara, MD;  Location: [facility] CATH LAB;  Service: Cardiovascular;  Laterality: N/A;    Social History   Social History  . Marital Status: Married    Spouse Name: N/A  . Number of Children: 4  . Years of Education: N/A   Occupational History  . Retired    Social History Main Topics  . Smoking status: Former Smoker -- 2 years    Types: Pipe    Quit date: 11/07/1965  . Smokeless tobacco: Never Used     Comment: quit over 40 years ago, never smoked cigarettes  . Alcohol Use: No  . Drug Use: No  . Sexual Activity: Not on file   Other Topics Concern  . Not on file   Social History Narrative    Current Outpatient Prescriptions on File Prior to Visit  Medication Sig Dispense Refill  . clopidogrel (PLAVIX) 75 MG tablet Take 1 tablet (75 mg total) by mouth daily with breakfast. 90 tablet 3  . methimazole (TAPAZOLE) 5 MG tablet TAKE 1 TABLET (5 MG TOTAL) BY MOUTH EVERY MONDAY, WEDNESDAY, AND FRIDAY. 36 tablet 2  .  metoprolol succinate (TOPROL-XL) 50 MG 24 hr tablet Take 1 tablet (50 mg total) by mouth daily. Take with or immediately following a meal. 90 tablet 3  . Multiple Vitamin (MULTIVITAMIN WITH MINERALS) TABS Take 1 tablet by mouth daily. Centrum Silver    . pantoprazole (PROTONIX) 40 MG tablet Take 1 tablet (40 mg total) by mouth 2 (two) times daily. 180 tablet 0  . simvastatin (ZOCOR) 40 MG tablet Take 1 tablet (40 mg total) by mouth at bedtime. 90 tablet 2  . Tamsulosin HCl (FLOMAX) 0.4 MG CAPS Take 0.4 mg by mouth every evening.      No current facility-administered medications on file prior to visit.    No Known Allergies  Family History  Problem Relation Age of Onset  . Breast cancer Mother   . Stroke Mother   . Transient ischemic attack Mother  BP 122/64 mmHg  Pulse 69  Temp(Src) 97.7 F (36.5 C) (Oral)  Ht 5\' 10"  (1.778 m)  Wt 169 lb (76.658 kg)  BMI 24.25 kg/m2  SpO2 96%    Review of Systems We says he walks ok, with a cane.    Objective:   Physical Exam VITAL SIGNS:  See vs page GENERAL: no distress Neuro: sensation is intact to touch on the legs      Assessment & Plan:  Neuropathic pain, worse recently   Subjective:   Patient here for Medicare annual wellness visit and management of other chronic and acute problems.     Risk factors: advanced age.    Roster of Physicians Providing Medical Care to Patient:  See "snapshot"    Activities of Daily Living: In your present state of health, do you have any difficulty performing the following activities (lives with wife)?:  Preparing food and eating?: No  Bathing yourself: No  Getting dressed: No  Using the toilet:No  Moving around from place to place: No  In the past year have you fallen or had a near fall?: No    Home Safety: Has smoke detector and wears seat belts. No firearms. No excess sun exposure.   Diet and Exercise  Current exercise habits: as limited by health probs. Dietary issues  discussed: pt reports a healthy diet.    Depression Screen  Q1: Over the past two weeks, have you felt down, depressed or hopeless?no  Q2: Over the past two weeks, have you felt little interest or pleasure in doing things? no   The following portions of the patient's history were reviewed and updated as appropriate: allergies, current medications, past family history, past medical history, past social history, past surgical history and problem list.   Review of Systems  Denies hearing loss, and visual loss Objective:   Vision:  Sees opthalmologist Hearing: grossly normal Body mass index:  See vs page Msk: pt easily and quickly performs "get-up-and-go" from a sitting position. Cognitive Impairment Assessment: cognition, memory and judgment appear normal.  remembers 3/3 at 5 minutes.  excellent recall.  can easily read and write a sentence.  alert and oriented x 3.     Assessment:   Medicare wellness utd on preventive parameters    Plan:   During the course of the visit the patient was educated and counseled about appropriate screening and preventive services including:       Fall prevention   Diabetes screening  Nutrition counseling   Vaccines / LABS Zostavax / Pneumococcal Vaccine  today  PSA  Patient Instructions (the written plan) was given to the patient.

## 2016-01-12 NOTE — Patient Instructions (Addendum)
Please come back for a regular physical appointment in 1 month.  i have sent a prescription to your pharmacy, for the pain. Please call next week if the pain is not better, so we can increase it.   please consider these measures for your health:  minimize alcohol.  do not use tobacco products.  have a colonoscopy at least every 10 years from age 80.  Women should have an annual mammogram from age 51.  keep firearms safely stored.  always use seat belts.  have working smoke alarms in your home.  see an eye doctor and dentist regularly.  never drive under the influence of alcohol or drugs (including prescription drugs).  those with fair skin should take precautions against the sun. good diet and exercise significantly improve your health.  please let me know if you wish to be referred to a dietician.  high blood sugar is very risky to your health.  you should see an eye doctor and dentist every year.  It is very important to get all recommended vaccinations.  it is critically important to prevent falling down (keep floor areas well-lit, dry, and free of loose objects.  If you have a cane, walker, or wheelchair, you should use it, even for short trips around the house.  Wear flat-soled shoes.  Also, try not to rush)

## 2016-02-03 ENCOUNTER — Ambulatory Visit (INDEPENDENT_AMBULATORY_CARE_PROVIDER_SITE_OTHER): Payer: Medicare Other | Admitting: Internal Medicine

## 2016-02-03 ENCOUNTER — Encounter: Payer: Self-pay | Admitting: Internal Medicine

## 2016-02-03 VITALS — BP 110/60 | HR 60 | Ht 70.0 in | Wt 171.0 lb

## 2016-02-03 DIAGNOSIS — K2270 Barrett's esophagus without dysplasia: Secondary | ICD-10-CM

## 2016-02-03 DIAGNOSIS — K219 Gastro-esophageal reflux disease without esophagitis: Secondary | ICD-10-CM | POA: Diagnosis not present

## 2016-02-03 MED ORDER — PANTOPRAZOLE SODIUM 40 MG PO TBEC: 40.0000 mg | DELAYED_RELEASE_TABLET | Freq: Two times a day (BID) | ORAL | Status: DC

## 2016-02-03 NOTE — Patient Instructions (Signed)
We have sent the following medications to your pharmacy for you to pick up at your convenience: Pantoprazole   Follow up with Dr Carlean Purl as needed. Hopefully your PCP can refill your pantoprazole.    I appreciate the opportunity to care for you.

## 2016-02-03 NOTE — Progress Notes (Signed)
   Subjective:    Patient ID: [name] [name], male    DOB: [birth year], 82 y.o.   MRN: [id number] Cc: f/u Barrett's HPI Doing well overall - no dysphagia, heartburn.  Medications, allergies, past medical history, past surgical history, family history and social history are reviewed and updated in the EMR.  Review of Systems As above    Objective:   Physical Exam BP 110/60 mmHg  Pulse 60  Ht 5\' 10"  (1.778 m)  Wt 171 lb (77.565 kg)  BMI 24.54 kg/m2 Elderly NAD    Assessment & Plan:  Barrett's esophagus without dysplasia - Plan: pantoprazole (PROTONIX) 40 MG tablet  Gastroesophageal reflux disease without esophagitis - Plan: pantoprazole (PROTONIX) 40 MG tablet  He will see me prn and see if PCP can refill PPI He has aged out of surveillance of Barrett's

## 2016-02-12 ENCOUNTER — Encounter: Payer: Medicare Other | Admitting: Endocrinology

## 2016-02-12 DIAGNOSIS — Z0289 Encounter for other administrative examinations: Secondary | ICD-10-CM

## 2016-03-14 ENCOUNTER — Encounter: Payer: Self-pay | Admitting: Endocrinology

## 2016-03-14 ENCOUNTER — Ambulatory Visit (INDEPENDENT_AMBULATORY_CARE_PROVIDER_SITE_OTHER): Payer: Medicare Other | Admitting: Endocrinology

## 2016-03-14 VITALS — BP 140/80 | HR 68 | Ht 70.0 in | Wt 171.4 lb

## 2016-03-14 DIAGNOSIS — E785 Hyperlipidemia, unspecified: Secondary | ICD-10-CM

## 2016-03-14 DIAGNOSIS — D509 Iron deficiency anemia, unspecified: Secondary | ICD-10-CM | POA: Diagnosis not present

## 2016-03-14 DIAGNOSIS — I10 Essential (primary) hypertension: Secondary | ICD-10-CM

## 2016-03-14 DIAGNOSIS — E0590 Thyrotoxicosis, unspecified without thyrotoxic crisis or storm: Secondary | ICD-10-CM

## 2016-03-14 LAB — LIPID PANEL
Cholesterol: 120 mg/dL (ref 0–200)
HDL: 42.6 mg/dL (ref >39.00)
LDL Cholesterol: 68 mg/dL (ref 0–99)
NonHDL: 77.52
Total CHOL/HDL Ratio: 3
Triglycerides: 48 mg/dL (ref 0.0–149.0)
VLDL: 9.6 mg/dL (ref 0.0–40.0)

## 2016-03-14 LAB — CBC WITH DIFFERENTIAL/PLATELET
Basophils Absolute: 0 K/uL (ref 0.0–0.1)
Basophils Relative: 0.3 % (ref 0.0–3.0)
Eosinophils Absolute: 0.2 K/uL (ref 0.0–0.7)
Eosinophils Relative: 3.1 % (ref 0.0–5.0)
HCT: 37.1 % — ABNORMAL LOW (ref 39.0–52.0)
Hemoglobin: 12.6 g/dL — ABNORMAL LOW (ref 13.0–17.0)
Lymphocytes Relative: 28.1 % (ref 12.0–46.0)
Lymphs Abs: 1.4 K/uL (ref 0.7–4.0)
MCHC: 33.9 g/dL (ref 30.0–36.0)
MCV: 92 fl (ref 78.0–100.0)
Monocytes Absolute: 0.6 K/uL (ref 0.1–1.0)
Monocytes Relative: 11.7 % (ref 3.0–12.0)
Neutro Abs: 2.9 K/uL (ref 1.4–7.7)
Neutrophils Relative %: 56.8 % (ref 43.0–77.0)
Platelets: 145 K/uL — ABNORMAL LOW (ref 150.0–400.0)
RBC: 4.03 Mil/uL — ABNORMAL LOW (ref 4.22–5.81)
RDW: 13.2 % (ref 11.5–15.5)
WBC: 5.1 K/uL (ref 4.0–10.5)

## 2016-03-14 LAB — BASIC METABOLIC PANEL WITH GFR
BUN: 23 mg/dL (ref 6–23)
CO2: 29 meq/L (ref 19–32)
Calcium: 9.1 mg/dL (ref 8.4–10.5)
Chloride: 108 meq/L (ref 96–112)
Creatinine, Ser: 0.81 mg/dL (ref 0.40–1.50)
GFR: 96.26 mL/min (ref >60.00)
Glucose, Bld: 99 mg/dL (ref 70–99)
Potassium: 4.1 meq/L (ref 3.5–5.1)
Sodium: 143 meq/L (ref 135–145)

## 2016-03-14 LAB — IBC PANEL
Iron: 62 ug/dL (ref 42–165)
Saturation Ratios: 22 % (ref 20.0–50.0)
TRANSFERRIN: 201 mg/dL — AB (ref 212.0–360.0)

## 2016-03-14 LAB — TSH: TSH: 1 u[IU]/mL (ref 0.35–4.50)

## 2016-03-14 NOTE — Patient Instructions (Addendum)
Please come back if the lump on your neck comes back.   blood tests are requested for you today.  We'll let you know about the results.    Please come back for a regular physical appointment in 4 months.

## 2016-03-14 NOTE — Progress Notes (Signed)
Subjective:    Patient ID: [name] [name], male    DOB: 01-08-1929, 82 y.o.   MRN: [id number]  HPI Pt states few days sensation of slight sensation of fullness under the left mandible, but no assoc pain.  The lump is less now. Past Medical History  Diagnosis Date  . GASTRIC POLYP 05/13/2009  . COLONIC POLYPS, ADENOMATOUS 01/14/2008  . HYPERLIPIDEMIA 06/01/2007  . ANEMIA, IRON DEFICIENCY 10/13/2008  . COAGULOPATHY, COUMADIN-INDUCED 01/14/2008  . ANXIETY 09/04/2008  . HYPERTENSION 06/01/2007  . ATRIAL FIBRILLATION, CHRONIC 01/14/2008    on warfarin since 2007  . UNSPECIFIED VENOUS INSUFFICIENCY 09/18/2008  . PLEURAL EFFUSION, LEFT 10/13/2008  . GERD 06/01/2007  . BARRETTS ESOPHAGUS 03/20/2009  . PROSTATE CANCER, HX OF 01/14/2008  . CATARACTS, BILATERAL, HX OF 01/14/2008  . CEREBROVASCULAR ACCIDENT, HX OF 08/18/2008  . PERSONAL HX COLONIC POLYPS 01/01/2010  . BASAL CELL CARCINOMA OF SKIN SITE UNSPECIFIED 09/28/2010  . Hyperglycemia   . Elevated PSA   . Restless leg syndrome   . Mitral regurgitation 06/13/2008    mitral valve replacement #20 St. Jude Biocor; Maze procedure by Dr. Amador Cunas  at [facility]  . CORONARY ARTERY DISEASE 06/01/2007    Cath 04/03/2008; Cath 11/01/1990  . Tachy-brady syndrome ([place]) 07/13/2008    medtronic Adapta gen. model ADDR01 ,serial # [id number] H by Dr  [name] at [facility]  . Peripheral artery disease ([place]) 06/16/2010    LEA doppler-left ABI nml 0.61 calcified vessels;right ABI  0.68  . Edema, peripheral 07/30/2009    LEV doppler- no thrombus or thrombophlebitis  . Aortic valvular disorder 08/16/2012    echo EF 45-50% LV mildly reduce,Biocor mitral valve,mild to mod. tricuspid regrug.,mild to mod. aortic regrug.  . Mitral valve disorder 09/08/2011    echo EF 50-55% LV normal  . Aortic valvular stenosis 11/19/2013  . Cardiomyopathy, ischemic 11/19/2013    EF 45-50% with inferior wall hypokinesis by echo 2014  . S/P CABG x 1 06/13/2008    SVG to LAD with open vein harvest  right thigh, LIMA harvested but not utilized by Dr Amador Cunas  . S/P mitral valve replacement with bioprosthetic valve 06/13/2008    36mm St Jude Biocor porcine bioprosthetic tissue valve by Dr Amador Cunas  . S/P Maze operation for atrial fibrillation 06/13/2008    Partial left atrial lesion set using cryothermy for bilateral pulmonary vein isolation by Dr Amador Cunas  . Prosthetic valve dysfunction     Mitral stenosis involving bioprosthetic tissue valve placed 06/13/2008  . Mitral stenosis     Porcine bioprosthetic tissue valve placed 06/13/2008  . Dysrhythmia     HX OF TACHY BRADY  . Pacemaker   . HYPERTHYROIDISM 01/06/2011  . 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 Prostatectomy    . Tonsillectomy    . Cystectomy      Left leg  . Pacemaker placement  07/13/2008    [place] dual chamber-Medtronic at [facility]   . Coronary artery bypass graft  06/13/2008    graft x1 w/vein graft LAD artery by Dr. Amador [facility]unas at [facility]  . Mitral valve replacement  06/13/2008    #20 St. Jude Bicor mitral valve ;Maze procedure at [facility] by Dr. Amador Cunas  . Cardiac catheterization  11/01/1990  . Skin cancer excision      removed from forehead and right leg,  nose/face  . Upper gastrointestinal endoscopy  07/14/2009    erosive reflux esopagitis, Barrett's esophagus  . Colonoscopy  07/14/2009    diverticulosis, internal hemorrhoids  . Cardiovascular stress test  06/03/2010    Persantine perfusion EF 45% mild to moderate ischemia basal and mid inferolateral region  . Cardiovascular stress test  01/01/2008    left ventricle normal,no significant ischemia  . Cardiovascular stress test  09/09/2005     possibility of ischemia inferior wall  . Tee without cardioversion N/A 01/22/2014    Procedure: TRANSESOPHAGEAL ECHOCARDIOGRAM (TEE);  Surgeon: Sanda Klein, MD;   Location: [facility] ENDOSCOPY;  Service: Cardiovascular;  Laterality: N/A;  . Coronary stent placement  03/24/2014    DES to LAD  . Transcatheter aortic valve replacement, transfemoral N/A 04/22/2014    Procedure: TRANSCATHETER AORTIC VALVE REPLACEMENT, TRANSFEMORAL;  Surgeon: [name], MD;  Location: [place];  Service: Open Heart Surgery;  Laterality: N/A;  TAVR-TF (RIGHT SIDE)  . Intraoperative transesophageal echocardiogram N/A 04/22/2014    Procedure: INTRAOPERATIVE TRANSTHORACIC ECHOCARDIOGRAM;  Surgeon: [name], MD;  Location: [place];  Service: Open Heart Surgery;  Laterality: N/A;  . Left and right heart catheterization with coronary angiogram N/A 01/08/2014    Procedure: LEFT AND RIGHT HEART CATHETERIZATION WITH CORONARY ANGIOGRAM;  Surgeon: Sanda Klein, MD;  Location: [place] CATH LAB;  Service: Cardiovascular;  Laterality: N/A;  . Percutaneous coronary rotoblator intervention (pci-r) N/A 03/24/2014    Procedure: PERCUTANEOUS CORONARY ROTOBLATOR INTERVENTION (PCI-R);  Surgeon: Blane Ohara, MD;  Location: [facility] CATH LAB;  Service: Cardiovascular;  Laterality: N/A;    Social History   Social History  . Marital Status: Married    Spouse Name: N/A  . Number of Children: 4  . Years of Education: N/A   Occupational History  . Retired    Social History Main Topics  . Smoking status: Former Smoker -- 2 years    Types: Pipe    Quit date: 11/07/1965  . Smokeless tobacco: Never Used     Comment: quit over 40 years ago, never smoked cigarettes  . Alcohol Use: No  . Drug Use: No  . Sexual Activity: Not on file   Other Topics Concern  . Not on file   Social History Narrative    Current Outpatient Prescriptions on File Prior to Visit  Medication Sig Dispense Refill  . clopidogrel (PLAVIX) 75 MG tablet Take 1 tablet (75 mg total) by mouth daily with breakfast. 90 tablet 3  . gabapentin (NEURONTIN) 100 MG capsule Take 1 capsule (100 mg total) by mouth at bedtime. 90 capsule 11  .  methimazole (TAPAZOLE) 5 MG tablet TAKE 1 TABLET (5 MG TOTAL) BY MOUTH EVERY MONDAY, WEDNESDAY, AND FRIDAY. 36 tablet 2  . metoprolol succinate (TOPROL-XL) 50 MG 24 hr tablet Take 1 tablet (50 mg total) by mouth daily. Take with or immediately following a meal. 90 tablet 3  . Multiple Vitamin (MULTIVITAMIN WITH MINERALS) TABS Take 1 tablet by mouth daily. Centrum Silver    . pantoprazole (PROTONIX) 40 MG tablet Take 1 tablet (40 mg total) by mouth 2 (two) times daily. 180 tablet 0  . simvastatin (ZOCOR) 40 MG tablet Take 1 tablet (40 mg total) by mouth at bedtime. 90 tablet 2  . Tamsulosin HCl (FLOMAX) 0.4 MG CAPS Take 0.4 mg by mouth every evening.      No current facility-administered medications on file prior to visit.    No Known Allergies  Family History  Problem Relation Age of Onset  .  Breast cancer Mother   . Stroke Mother   . Transient ischemic attack Mother     BP 140/80 mmHg  Pulse 68  Ht 5\' 10"  (1.778 m)  Wt 171 lb 6.4 oz (77.747 kg)  BMI 24.59 kg/m2  SpO2 94%  Review of Systems Denies fever and rash.     Objective:   Physical Exam VITAL SIGNS:  See vs page.  GENERAL: no distress. NECK: There is no palpable thyroid enlargement.  No thyroid nodule is palpable.  No palpable lymphadenopathy at the anterior neck.    Lab Results  Component Value Date   WBC 5.1 03/14/2016   HGB 12.6* 03/14/2016   HCT 37.1* 03/14/2016   MCV 92.0 03/14/2016   PLT 145.0* 03/14/2016   Lab Results  Component Value Date   CHOL 120 03/14/2016   HDL 42.60 03/14/2016   LDLCALC 68 03/14/2016   TRIG 48.0 03/14/2016   CHOLHDL 3 03/14/2016       Assessment & Plan:  Neck nodule, new, now non-palpable Anemia, mild: All we need to do is to recheck this in the future. Dyslipidemia: mild.  Please continue the same medication.  Patient is advised the following: Patient Instructions  Please come back if the lump on your neck comes back.   blood tests are requested for you today.  We'll  let you know about the results.    Please come back for a regular physical appointment in 4 months.

## 2016-03-17 ENCOUNTER — Encounter: Payer: Self-pay | Admitting: Internal Medicine

## 2016-03-24 ENCOUNTER — Encounter (HOSPITAL_BASED_OUTPATIENT_CLINIC_OR_DEPARTMENT_OTHER): Payer: Self-pay | Admitting: *Deleted

## 2016-03-24 ENCOUNTER — Emergency Department (HOSPITAL_BASED_OUTPATIENT_CLINIC_OR_DEPARTMENT_OTHER): Payer: Medicare Other

## 2016-03-24 ENCOUNTER — Emergency Department (HOSPITAL_BASED_OUTPATIENT_CLINIC_OR_DEPARTMENT_OTHER)
Admission: EM | Admit: 2016-03-24 | Discharge: 2016-03-24 | Disposition: A | Discharge status: Home / Self Care | Payer: Medicare Other | Attending: Emergency Medicine | Admitting: Emergency Medicine

## 2016-03-24 DIAGNOSIS — Z87891 Personal history of nicotine dependence: Secondary | ICD-10-CM | POA: Diagnosis not present

## 2016-03-24 DIAGNOSIS — W101XXA Fall (on)(from) sidewalk curb, initial encounter: Secondary | ICD-10-CM | POA: Diagnosis not present

## 2016-03-24 DIAGNOSIS — I2510 Atherosclerotic heart disease of native coronary artery without angina pectoris: Secondary | ICD-10-CM | POA: Insufficient documentation

## 2016-03-24 DIAGNOSIS — I739 Peripheral vascular disease, unspecified: Secondary | ICD-10-CM | POA: Insufficient documentation

## 2016-03-24 DIAGNOSIS — E785 Hyperlipidemia, unspecified: Secondary | ICD-10-CM | POA: Diagnosis not present

## 2016-03-24 DIAGNOSIS — I10 Essential (primary) hypertension: Secondary | ICD-10-CM | POA: Diagnosis not present

## 2016-03-24 DIAGNOSIS — Y999 Unspecified external cause status: Secondary | ICD-10-CM | POA: Diagnosis not present

## 2016-03-24 DIAGNOSIS — S0993XA Unspecified injury of face, initial encounter: Secondary | ICD-10-CM | POA: Diagnosis present

## 2016-03-24 DIAGNOSIS — S0081XA Abrasion of other part of head, initial encounter: Secondary | ICD-10-CM | POA: Diagnosis not present

## 2016-03-24 DIAGNOSIS — S0990XA Unspecified injury of head, initial encounter: Secondary | ICD-10-CM | POA: Insufficient documentation

## 2016-03-24 DIAGNOSIS — W19XXXA Unspecified fall, initial encounter: Secondary | ICD-10-CM

## 2016-03-24 DIAGNOSIS — I482 Chronic atrial fibrillation: Secondary | ICD-10-CM | POA: Diagnosis not present

## 2016-03-24 DIAGNOSIS — Y9389 Activity, other specified: Secondary | ICD-10-CM | POA: Insufficient documentation

## 2016-03-24 DIAGNOSIS — Y92512 Supermarket, store or market as the place of occurrence of the external cause: Secondary | ICD-10-CM | POA: Insufficient documentation

## 2016-03-24 NOTE — Discharge Instructions (Signed)
Head Injury, Adult You have received a head injury. It does not appear serious at this time. Headaches and vomiting are common following head injury. It should be easy to awaken from sleeping. Sometimes it is necessary for you to stay in the emergency department for a while for observation. Sometimes admission to the hospital may be needed. After injuries such as yours, most problems occur within the first 24 hours, but side effects may occur up to 7-10 days after the injury. It is important for you to carefully monitor your condition and contact your health care provider or seek immediate medical care if there is a change in your condition. WHAT ARE THE TYPES OF HEAD INJURIES? Head injuries can be as minor as a bump. Some head injuries can be more severe. More severe head injuries include:  A jarring injury to the brain (concussion).  A bruise of the brain (contusion). This mean there is bleeding in the brain that can cause swelling.  A cracked skull (skull fracture).  Bleeding in the brain that collects, clots, and forms a bump (hematoma). WHAT CAUSES A HEAD INJURY? A serious head injury is most likely to happen to someone who is in a car wreck and is not wearing a seat belt. Other causes of major head injuries include bicycle or motorcycle accidents, sports injuries, and falls. HOW ARE HEAD INJURIES DIAGNOSED? A complete history of the event leading to the injury and your current symptoms will be helpful in diagnosing head injuries. Many times, pictures of the brain, such as CT or MRI are needed to see the extent of the injury. Often, an overnight hospital stay is necessary for observation.  WHEN SHOULD I SEEK IMMEDIATE MEDICAL CARE?  You should get help right away if:  You have confusion or drowsiness.  You feel sick to your stomach (nauseous) or have continued, forceful vomiting.  You have dizziness or unsteadiness that is getting worse.  You have severe, continued headaches not  relieved by medicine. Only take over-the-counter or prescription medicines for pain, fever, or discomfort as directed by your health care provider.  You do not have normal function of the arms or legs or are unable to walk.  You notice changes in the black spots in the center of the colored part of your eye (pupil).  You have a clear or bloody fluid coming from your nose or ears.  You have a loss of vision. During the next 24 hours after the injury, you must stay with someone who can watch you for the warning signs. This person should contact local emergency services (911 in the [place] if you have seizures, you become unconscious, or you are unable to wake up. HOW CAN I PREVENT A HEAD INJURY IN THE FUTURE? The most important factor for preventing major head injuries is avoiding motor vehicle accidents. To minimize the potential for damage to your head, it is crucial to wear seat belts while riding in motor vehicles. Wearing helmets while bike riding and playing collision sports (like football) is also helpful. Also, avoiding dangerous activities around the house will further help reduce your risk of head injury.  WHEN CAN I RETURN TO NORMAL ACTIVITIES AND ATHLETICS? You should be reevaluated by your health care provider before returning to these activities. If you have any of the following symptoms, you should not return to activities or contact sports until 1 week after the symptoms have stopped:  Persistent headache.  Dizziness or vertigo.  Poor attention and concentration.  Confusion.  Memory problems.  Nausea or vomiting.  Fatigue or tire easily.  Irritability.  Intolerant of bright lights or loud noises.  Anxiety or depression.  Disturbed sleep. MAKE SURE YOU:   Understand these instructions.  Will watch your condition.  Will get help right away if you are not doing well or get worse.   This information is not intended to replace advice given to you by your health  care provider. Make sure you discuss any questions you have with your health care provider.   Document Released: 10/24/2005 Document Revised: 11/14/2014 Document Reviewed: 07/01/2013 Elsevier Interactive Patient Education 2016 [place] An abrasion is a cut or scrape on the outer surface of your skin. An abrasion does not extend through all of the layers of your skin. It is important to care for your abrasion properly to prevent infection. CAUSES Most abrasions are caused by falling on or gliding across the ground or another surface. When your skin rubs on something, the outer and inner layer of skin rubs off.  SYMPTOMS A cut or scrape is the main symptom of this condition. The scrape may be bleeding, or it may appear red or pink. If there was an associated fall, there may be an underlying bruise. DIAGNOSIS An abrasion is diagnosed with a physical exam. TREATMENT Treatment for this condition depends on how large and deep the abrasion is. Usually, your abrasion will be cleaned with water and mild soap. This removes any dirt or debris that may be stuck. An antibiotic ointment may be applied to the abrasion to help prevent infection. A bandage (dressing) may be placed on the abrasion to keep it clean. You may also need a tetanus shot. HOME CARE INSTRUCTIONS Medicines  Take or apply medicines only as directed by your health care provider.  If you were prescribed an antibiotic ointment, finish all of it even if you start to feel better. Wound Care  Clean the wound with mild soap and water 2-3 times per day or as directed by your health care provider. Pat your wound dry with a clean towel. Do not rub it.  There are many different ways to close and cover a wound. Follow instructions from your health care provider about:  Wound care.  Dressing changes and removal.  Check your wound every day for signs of infection. Watch for:  Redness, swelling, or pain.  Fluid, blood, or  pus. General Instructions  Keep the dressing dry as directed by your health care provider. Do not take baths, swim, use a hot tub, or do anything that would put your wound underwater until your health care provider approves.  If there is swelling, raise (elevate) the injured area above the level of your heart while you are sitting or lying down.  Keep all follow-up visits as directed by your health care provider. This is important. SEEK MEDICAL CARE IF:  You received a tetanus shot and you have swelling, severe pain, redness, or bleeding at the injection site.  Your pain is not controlled with medicine.  You have increased redness, swelling, or pain at the site of your wound. SEEK IMMEDIATE MEDICAL CARE IF:  You have a red streak going away from your wound.  You have a fever.  You have fluid, blood, or pus coming from your wound.  You notice a bad smell coming from your wound or your dressing.   This information is not intended to replace advice given to you by your health care provider. Make  sure you discuss any questions you have with your health care provider.   Document Released: 08/03/2005 Document Revised: 07/15/2015 Document Reviewed: 10/22/2014 Elsevier Interactive Patient Education [employer].

## 2016-03-24 NOTE — ED Notes (Signed)
Pt reports he was shopping at [facility] Tuesday am and tripped on a curb in the parking lot and fell. Pt states "it really didn't hurt anything." pt states "my wife made me come in here today, but I feel fine." denies loc or any other c/o.

## 2016-03-24 NOTE — ED Provider Notes (Signed)
CSN: [id number]     Arrival date & time 03/24/16  0944 History   First MD Initiated Contact with Patient 03/24/16 [phone]     Chief Complaint  Patient presents with  . Fall     (Consider location/radiation/quality/duration/timing/severity/associated sxs/prior Treatment) HPI Patient with trip and fall 2 days ago in the parking lot. Hit face on the asphalt. No loss of consciousness. He sustained multiple abrasions to the left side of the face. Had mild lip swelling which is improved. Currently denies any headache, nausea or vomiting. No visual changes. No focal weakness or numbness. No neck pain. Patient does take Plavix for CAD but is no longer on Coumadin. Past Medical History  Diagnosis Date  . GASTRIC POLYP 05/13/2009  . COLONIC POLYPS, ADENOMATOUS 01/14/2008  . HYPERLIPIDEMIA 06/01/2007  . ANEMIA, IRON DEFICIENCY 10/13/2008  . COAGULOPATHY, COUMADIN-INDUCED 01/14/2008  . ANXIETY 09/04/2008  . HYPERTENSION 06/01/2007  . ATRIAL FIBRILLATION, CHRONIC 01/14/2008    on warfarin since 2007  . UNSPECIFIED VENOUS INSUFFICIENCY 09/18/2008  . PLEURAL EFFUSION, LEFT 10/13/2008  . GERD 06/01/2007  . BARRETTS ESOPHAGUS 03/20/2009  . PROSTATE CANCER, HX OF 01/14/2008  . CATARACTS, BILATERAL, HX OF 01/14/2008  . CEREBROVASCULAR ACCIDENT, HX OF 08/18/2008  . PERSONAL HX COLONIC POLYPS 01/01/2010  . BASAL CELL CARCINOMA OF SKIN SITE UNSPECIFIED 09/28/2010  . Hyperglycemia   . Elevated PSA   . Restless leg syndrome   . Mitral regurgitation 06/13/2008    mitral valve replacement #20 St. Jude Biocor; Maze procedure by Dr. Amador Cunas  at [facility]  . CORONARY ARTERY DISEASE 06/01/2007    Cath 04/03/2008; Cath 11/01/1990  . Tachy-brady syndrome ([place]) 07/13/2008    medtronic Adapta gen. model ADDR01 ,serial # [id number] by Dr  [name] at [facility]  . Peripheral artery disease ([place]) 06/16/2010    LEA doppler-left ABI nml 0.61 calcified vessels;right ABI  0.68  . Edema, peripheral 07/30/2009    LEV doppler- no thrombus  or thrombophlebitis  . Aortic valvular disorder 08/16/2012    echo EF 45-50% LV mildly reduce,Biocor mitral valve,mild to mod. tricuspid regrug.,mild to mod. aortic regrug.  . Mitral valve disorder 09/08/2011    echo EF 50-55% LV normal  . Aortic valvular stenosis 11/19/2013  . Cardiomyopathy, ischemic 11/19/2013    EF 45-50% with inferior wall hypokinesis by echo 2014  . S/P CABG x 1 06/13/2008    SVG to LAD with open vein harvest right thigh, LIMA harvested but not utilized by Dr Amador Cunas  . S/P mitral valve replacement with bioprosthetic valve 06/13/2008    83mm St Jude Biocor porcine bioprosthetic tissue valve by Dr Amador Cunas  . S/P Maze operation for atrial fibrillation 06/13/2008    Partial left atrial lesion set using cryothermy for bilateral pulmonary vein isolation by Dr Amador Cunas  . Prosthetic valve dysfunction     Mitral stenosis involving bioprosthetic tissue valve placed 06/13/2008  . Mitral stenosis     Porcine bioprosthetic tissue valve placed 06/13/2008  . Dysrhythmia     HX OF TACHY BRADY  . Pacemaker   . HYPERTHYROIDISM 01/06/2011  . 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 Prostatectomy    . Tonsillectomy    . Cystectomy      Left leg  . Pacemaker placement  07/13/2008    [place] dual chamber-Medtronic at [facility]   .  Coronary artery bypass graft  06/13/2008    graft x1 w/vein graft LAD artery by Dr. Amador Cunas at [facility]  . Mitral valve replacement  06/13/2008    #20 St. Jude Bicor mitral valve ;Maze procedure at [facility] by Dr. Amador Cunas  . Cardiac catheterization  11/01/1990  . Skin cancer excision      removed from forehead and right leg, nose/face  . Upper gastrointestinal endoscopy  07/14/2009    erosive reflux esopagitis, Barrett's esophagus  . Colonoscopy  07/14/2009    diverticulosis, internal hemorrhoids  .  Cardiovascular stress test  06/03/2010    Persantine perfusion EF 45% mild to moderate ischemia basal and mid inferolateral region  . Cardiovascular stress test  01/01/2008    left ventricle normal,no significant ischemia  . Cardiovascular stress test  09/09/2005     possibility of ischemia inferior wall  . Tee without cardioversion N/A 01/22/2014    Procedure: TRANSESOPHAGEAL ECHOCARDIOGRAM (TEE);  Surgeon: Sanda Klein, MD;  Location: [facility] ENDOSCOPY;  Service: Cardiovascular;  Laterality: N/A;  . Coronary stent placement  03/24/2014    DES to LAD  . Transcatheter aortic valve replacement, transfemoral N/A 04/22/2014    Procedure: TRANSCATHETER AORTIC VALVE REPLACEMENT, TRANSFEMORAL;  Surgeon: [name], MD;  Location: [place];  Service: Open Heart Surgery;  Laterality: N/A;  TAVR-TF (RIGHT SIDE)  . Intraoperative transesophageal echocardiogram N/A 04/22/2014    Procedure: INTRAOPERATIVE TRANSTHORACIC ECHOCARDIOGRAM;  Surgeon: [name], MD;  Location: [place];  Service: Open Heart Surgery;  Laterality: N/A;  . Left and right heart catheterization with coronary angiogram N/A 01/08/2014    Procedure: LEFT AND RIGHT HEART CATHETERIZATION WITH CORONARY ANGIOGRAM;  Surgeon: Sanda Klein, MD;  Location: [place] CATH LAB;  Service: Cardiovascular;  Laterality: N/A;  . Percutaneous coronary rotoblator intervention (pci-r) N/A 03/24/2014    Procedure: PERCUTANEOUS CORONARY ROTOBLATOR INTERVENTION (PCI-R);  Surgeon: Blane Ohara, MD;  Location: [facility] CATH LAB;  Service: Cardiovascular;  Laterality: N/A;   Family History  Problem Relation Age of Onset  . Breast cancer Mother   . Stroke Mother   . Transient ischemic attack Mother    Social History  Substance Use Topics  . Smoking status: Former Smoker -- 2 years    Types: Pipe    Quit date: 11/07/1965  . Smokeless tobacco: Never Used     Comment: quit over 40 years ago, never smoked cigarettes  . Alcohol Use: No    Review of Systems   Constitutional: Negative for fever and chills.  HENT: Positive for facial swelling.   Eyes: Negative for photophobia and visual disturbance.  Respiratory: Negative for shortness of breath.   Cardiovascular: Negative for chest pain.  Gastrointestinal: Negative for nausea, vomiting, abdominal pain, diarrhea and constipation.  Musculoskeletal: Negative for back pain, neck pain and neck stiffness.  Skin: Positive for wound.  Neurological: Negative for syncope, weakness, numbness and headaches.  All other systems reviewed and are negative.     Allergies  Review of patient's allergies indicates no known allergies.  Home Medications   Prior to Admission medications   Medication Sig Start Date End Date Taking? Authorizing Provider  clopidogrel (PLAVIX) 75 MG tablet Take 1 tablet (75 mg total) by mouth daily with breakfast. 10/15/15   [name] [name], MD  gabapentin (NEURONTIN) 100 MG capsule Take 1 capsule (100 mg total) by mouth at bedtime. 01/12/16   [name], MD  methimazole (TAPAZOLE) 5 MG tablet TAKE 1 TABLET (5 MG TOTAL) BY MOUTH EVERY MONDAY, WEDNESDAY, AND FRIDAY. 03/31/15  [name], MD  metoprolol succinate (TOPROL-XL) 50 MG 24 hr tablet Take 1 tablet (50 mg total) by mouth daily. Take with or immediately following a meal. 10/15/15   [name] [name], MD  Multiple Vitamin (MULTIVITAMIN WITH MINERALS) TABS Take 1 tablet by mouth daily. Centrum Silver    Historical Provider, MD  pantoprazole (PROTONIX) 40 MG tablet Take 1 tablet (40 mg total) by mouth 2 (two) times daily. 02/03/16   Gatha Mayer, MD  simvastatin (ZOCOR) 40 MG tablet Take 1 tablet (40 mg total) by mouth at bedtime. 06/15/15   [name], MD  Tamsulosin HCl (FLOMAX) 0.4 MG CAPS Take 0.4 mg by mouth every evening.     Historical Provider, MD   BP 133/69 mmHg  Pulse 69  Temp(Src) 97.4 F (36.3 C) (Oral)  Resp 16  Ht 5\' 10"  (1.778 m)  Wt 170 lb (77.111 kg)  BMI 24.39 kg/m2  SpO2 97% Physical Exam   Constitutional: He is oriented to person, place, and time. He appears well-developed and well-nourished. No distress.  HENT:  Head: Normocephalic and atraumatic.  Mouth/Throat: Oropharynx is clear and moist.  Midface is stable. No malocclusion. No bony tenderness. There is mild swelling to the left side of the lower lip. Well-healing abrasions to the left side of the face.  Eyes: EOM are normal. Pupils are equal, round, and reactive to light.  Neck: Normal range of motion. Neck supple.  No posterior midline cervical tenderness to palpation.  Cardiovascular: Normal rate and regular rhythm.  Exam reveals no gallop and no friction rub.   No murmur heard. Pulmonary/Chest: Effort normal and breath sounds normal. No respiratory distress. He has no wheezes. He has no rales. He exhibits no tenderness.  Abdominal: Soft. Bowel sounds are normal. He exhibits no distension and no mass. There is no tenderness. There is no rebound and no guarding.  Musculoskeletal: Normal range of motion. He exhibits no edema or tenderness.  No midline thoracic or lumbar tenderness. No lower extremity swelling or asymmetry. Distal pulses are equal and intact.  Neurological: He is alert and oriented to person, place, and time.  5/5 motor in all extremities. Sensation is fully intact. Ambulating without any difficulty.  Skin: Skin is warm and dry. No rash noted. No erythema.  Psychiatric: He has a normal mood and affect. His behavior is normal.  Nursing note and vitals reviewed.   ED Course  Procedures (including critical care time) Labs Review Labs Reviewed - No data to display  Imaging Review Ct Head Wo Contrast  03/24/2016  CLINICAL DATA:  Pain following fall EXAM: CT HEAD WITHOUT CONTRAST TECHNIQUE: Contiguous axial images were obtained from the base of the skull through the vertex without intravenous contrast. COMPARISON:  September 03, 2015 FINDINGS: Moderate diffuse atrophy is stable. There is no intracranial  mass, hemorrhage, extra-axial fluid collection, or midline shift. There is evidence of a prior infarct in the superior right cerebellum, stable. There is patchy small vessel disease in the centra semiovale bilaterally. There is a prior small infarct in the anterior limb of the left internal capsule. There is evidence of a prior infarct in the anterior right lentiform nucleus extending into the inferior right centrum semiovale. No acute appearing infarct is evident. The bony calvarium appears intact. Visualized mastoid air cells are clear. No intraorbital lesions are evident. There is mucosal thickening in several ethmoid air cells bilaterally, most notably posteriorly on the right. There is a small osteoma in the anterior superior left ethmoid region,  a benign finding. There is vascular calcification in both distal vertebral arteries as well as in the cavernous carotid artery regions, stable. IMPRESSION: Atrophy with prior infarcts and periventricular small vessel disease as summarized above. No acute infarct evident. No hemorrhage or mass effect. Multiple foci of arterial vascular calcification present. Areas of paranasal sinus disease. Electronically Signed   By: Lowella Grip III M.D.   On: 03/24/2016 10:52   I have personally reviewed and evaluated these images and lab results as part of my medical decision-making.   EKG Interpretation None      MDM   Final diagnoses:  Fall, initial encounter  Facial abrasion, initial encounter  Closed head injury, initial encounter   No acute injury noted on CT. Normal neurologic exam. Advised to follow-up with his primary care doctor. Head injury precautions given.     Julianne Rice, MD 03/24/16 1122

## 2016-03-28 ENCOUNTER — Encounter: Payer: Self-pay | Admitting: Cardiovascular Disease

## 2016-03-28 ENCOUNTER — Ambulatory Visit (INDEPENDENT_AMBULATORY_CARE_PROVIDER_SITE_OTHER): Payer: Medicare Other | Admitting: Cardiovascular Disease

## 2016-03-28 VITALS — BP 121/76 | HR 70 | Ht 70.0 in | Wt 169.0 lb

## 2016-03-28 DIAGNOSIS — I471 Supraventricular tachycardia: Secondary | ICD-10-CM

## 2016-03-28 DIAGNOSIS — I5032 Chronic diastolic (congestive) heart failure: Secondary | ICD-10-CM | POA: Diagnosis not present

## 2016-03-28 DIAGNOSIS — I2510 Atherosclerotic heart disease of native coronary artery without angina pectoris: Secondary | ICD-10-CM

## 2016-03-28 DIAGNOSIS — I10 Essential (primary) hypertension: Secondary | ICD-10-CM

## 2016-03-28 DIAGNOSIS — Z952 Presence of prosthetic heart valve: Secondary | ICD-10-CM

## 2016-03-28 DIAGNOSIS — Z953 Presence of xenogenic heart valve: Secondary | ICD-10-CM

## 2016-03-28 DIAGNOSIS — Z954 Presence of other heart-valve replacement: Secondary | ICD-10-CM

## 2016-03-28 DIAGNOSIS — Z9861 Coronary angioplasty status: Secondary | ICD-10-CM

## 2016-03-28 DIAGNOSIS — Z950 Presence of cardiac pacemaker: Secondary | ICD-10-CM

## 2016-03-28 DIAGNOSIS — I351 Nonrheumatic aortic (valve) insufficiency: Secondary | ICD-10-CM

## 2016-03-28 DIAGNOSIS — E0580 Other thyrotoxicosis without thyrotoxic crisis or storm: Secondary | ICD-10-CM

## 2016-03-28 DIAGNOSIS — E785 Hyperlipidemia, unspecified: Secondary | ICD-10-CM

## 2016-03-28 DIAGNOSIS — T462X5A Adverse effect of other antidysrhythmic drugs, initial encounter: Secondary | ICD-10-CM | POA: Insufficient documentation

## 2016-03-28 DIAGNOSIS — Z9181 History of falling: Secondary | ICD-10-CM

## 2016-03-28 DIAGNOSIS — K449 Diaphragmatic hernia without obstruction or gangrene: Secondary | ICD-10-CM

## 2016-03-28 NOTE — Patient Instructions (Signed)
Medication Instructions: Dr [name] recommends that you continue on your current medications as directed. Please refer to the Current Medication list given to you today.  Labwork: NONE ORDERED  Testing/Procedures: 1. Echocardiogram - Your physician has requested that you have an echocardiogram. Echocardiography is a painless test that uses sound waves to create images of your heart. It provides your doctor with information about the size and shape of your heart and how well your heart's chambers and valves are working. This procedure takes approximately one hour. There are no restrictions for this procedure.  2. Remote Pacemaker Download - Remote monitoring is used to monitor your Pacemaker of ICD from home. This monitoring reduces the number of office visits required to check your device to one time per year. It allows [place] to keep an eye on the functioning of your device to ensure it is working properly. You are scheduled for a device check from home on Wednesday, August 23rd, 2017. You may send your transmission at any time that day. If you have a wireless device, the transmission will be sent automatically. After your physician reviews your transmission, you will receive a postcard with your next transmission date.  Follow-up: Dr [name] recommends that you schedule a follow-up appointment in 6 months. You will receive a reminder letter in the mail two months in advance. If you don't receive a letter, please call our office to schedule the follow-up appointment.  If you need a refill on your cardiac medications before your next appointment, please call your pharmacy.

## 2016-03-28 NOTE — Progress Notes (Signed)
Patient ID: CLAUDIO GOMBERG, male   DOB: Feb 18, 1929, 82 y.o.   MRN: [id number]    Cardiology Office Note    Date:  03/28/2016   ID:  ANEL ANGSTADT, DOB 03/29/1929, MRN [id number]  PCP:  [name], MD  Cardiologist:   Sanda Klein, MD   Chief Complaint  Patient presents with  . Follow-up  . Atrial Fibrillation    History of Present Illness:  ALEXANDRIA SANOCKI is a 82 y.o. male with coronary, valvular and arrhythmic cardiac problems, here for follow-up.  Since his last appointment he has had a couple of falls. On one occasion he simply fell out of bed and hit the nightstand and the lamp. He required 8 stitches to his lower lip. On another occasion he was carrying a package in the [place] parking lot and tripped on the curve. He fell on his face and has lacerations on his forehead nose and cheek. He went to urgent care only 3 days later and had a CT scan that showed no abnormalities. Despite long-standing persistent atrial fibrillation, he was not taking anticoagulants due to problems with gastrointestinal bleeding. He is currently taking only clopidogrel as an antiplatelet or anticoagulant.  He denies being dizzy or experiencing syncope on either occasion.  His most recent echocardiogram from November 2016 shows that left ventricular systolic function has normalized with ejection fraction of around 60%. There is only trivial aortic insufficiency and the aortic valve prosthetic gradients are normal (mean grad 8 mm Hg), as are the mitral valve prosthetic gradients. The left atrium is severely dilated.  His amiodarone was stopped about 6 months ago since he had persistent atrial fibrillation and had virtually 100% ventricular pacing. Paradoxically, the burden of atrial fibrillation is actually lower and he spent most of April and early May in normal rhythm. Otherwise he was in persistent atrial fibrillation, the rhythm he presents in today. He has not had any problems with high ventricular rates.  He remains completely oblivious to the arrhythmia. He has only 77% ventricular pacing over the last 6 months. The burden of ventricular pacing seems to be gradually diminishing further. His device is programmed DDI in an effort to avoid tracking of occasional rapid atrial tachycardia. Other device parameters are all normal on today's check. Estimated generator longevity is just over 2 years.  He denies angina pectoris, edema, worsening dyspnea or intermittent claudication. He has not had overt bleeding other than associated with his injuries. There has been no evidence of ongoing GI bleeding.   His cardiac problems date back to 2009 when he was diagnosed with severe mitral insufficiency related to mitral valve prolapse. He was also having problems with paroxysmal atrial fibrillation. Coronary angiography demonstrated moderate coronary artery disease primarily a 70% calcified eccentric proximal LAD lesion with moderate stenoses in the other 2 major coronary arteries. He was referred for robotic mitral valve repair by Dr. Amador Cunas at [facility]. The LIMA was found to be a very small vessel. He received a saphenous vein graft bypass to the LAD. Cryoablation was performed for atrial fibrillation. The mitral valve was replaced with a 29 mm St. Jude Biocor prosthesis. The postoperative course was complicated by an embolic stroke from which she has recovered without sequelae. He received a dual-chamber permanent pacemaker (Medtronic adapta) and was pacemaker dependent with 100% pacing in both the atrium and the ventricle, though he has subsequently regained AV conduction at least partly. Subsequently, he has developed progressive CAD and aortic valve disease and underwent rotational  atherectomy and stenting of the proximal LAD on 03/24/2014 (3 x 8 mm Xience Alpine drug-eluting stent) and then TAVR (29 mm Edwards Sapien) on April 22, 2014.  Left ventricular systolic function was mildly depressed with an EF of 45-50% primarily  due to RV pacing induced asynchrony, but follow-up echo in May 2016 suggested EF dropped to 35-40 percent. The most recent echocardiogram in November 2016 shows normal left ventricular ejection fraction of 60%. This last echo showed that the aortic valve insufficiency was only trivial. 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 This prevents imaging of his heart from transesophageal views  He has mild hyperthyroidism that is well controlled on low dose of methimazole. 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 He has gastroesophageal reflux disease and Barrett's esophagus. He has extensive sigmoid and transverse colonic diverticulosis. One month following aortic stent valve replacement he had diverticular bleeding requiring angiographic coil embolization.   Past Medical History  Diagnosis Date  . GASTRIC POLYP 05/13/2009  . COLONIC POLYPS, ADENOMATOUS 01/14/2008  . HYPERLIPIDEMIA 06/01/2007  . ANEMIA, IRON DEFICIENCY 10/13/2008  . COAGULOPATHY, COUMADIN-INDUCED 01/14/2008  . ANXIETY 09/04/2008  . HYPERTENSION 06/01/2007  . ATRIAL FIBRILLATION, CHRONIC 01/14/2008    on warfarin since 2007  . UNSPECIFIED VENOUS INSUFFICIENCY 09/18/2008  . PLEURAL EFFUSION, LEFT 10/13/2008  . GERD 06/01/2007  . BARRETTS ESOPHAGUS 03/20/2009  . PROSTATE CANCER, HX OF 01/14/2008  . CATARACTS, BILATERAL, HX OF 01/14/2008  . CEREBROVASCULAR ACCIDENT, HX OF 08/18/2008  . PERSONAL HX COLONIC POLYPS 01/01/2010  . BASAL CELL CARCINOMA OF SKIN SITE UNSPECIFIED 09/28/2010  . Hyperglycemia   . Elevated PSA   . Restless leg syndrome   . Mitral regurgitation 06/13/2008    mitral valve replacement #20 St. Jude Biocor; Maze procedure by Dr. Amador Cunas  at [facility]  . CORONARY ARTERY DISEASE 06/01/2007    Cath 04/03/2008; Cath 11/01/1990  . Tachy-brady syndrome ([place]) 07/13/2008    medtronic Adapta gen. model ADDR01 ,serial # [id number] H by Dr  [name] at [facility]  . Peripheral artery disease  ([place]) 06/16/2010    LEA doppler-left ABI nml 0.61 calcified vessels;right ABI  0.68  . Edema, peripheral 07/30/2009    LEV doppler- no thrombus or thrombophlebitis  . Aortic valvular disorder 08/16/2012    echo EF 45-50% LV mildly reduce,Biocor mitral valve,mild to mod. tricuspid regrug.,mild to mod. aortic regrug.  . Mitral valve disorder 09/08/2011    echo EF 50-55% LV normal  . Aortic valvular stenosis 11/19/2013  . Cardiomyopathy, ischemic 11/19/2013    EF 45-50% with inferior wall hypokinesis by echo 2014  . S/P CABG x 1 06/13/2008    SVG to LAD with open vein harvest right thigh, LIMA harvested but not utilized by Dr Amador Cunas  . S/P mitral valve replacement with bioprosthetic valve 06/13/2008    2mm St Jude Biocor porcine bioprosthetic tissue valve by Dr Amador Cunas  . S/P Maze operation for atrial fibrillation 06/13/2008    Partial left atrial lesion set using cryothermy for bilateral pulmonary vein isolation by Dr Amador Cunas  . Prosthetic valve dysfunction     Mitral stenosis involving bioprosthetic tissue valve placed 06/13/2008  . Mitral stenosis     Porcine bioprosthetic tissue valve placed 06/13/2008  . Dysrhythmia     HX OF TACHY BRADY  . Pacemaker   . HYPERTHYROIDISM 01/06/2011  . S/P TAVR (transcatheter aortic valve replacement) 04/22/2014    29 mm Edwards Sapien XT transcatheter heart valve  placed via open right transfemoral approach    Past Surgical History  Procedure Laterality Date  . Prostatectomy    . Tonsillectomy    . Cystectomy      Left leg  . Pacemaker placement  07/13/2008    [place] dual chamber-Medtronic at [facility]   . Coronary artery bypass graft  06/13/2008    graft x1 w/vein graft LAD artery by Dr. Amador Cunas at [facility]  . Mitral valve replacement  06/13/2008    #20 St. Jude Bicor mitral valve ;Maze procedure at [facility] by Dr. Amador Cunas  . Cardiac catheterization  11/01/1990  . Skin cancer excision      removed from forehead and right leg, nose/face  . Upper gastrointestinal  endoscopy  07/14/2009    erosive reflux esopagitis, Barrett's esophagus  . Colonoscopy  07/14/2009    diverticulosis, internal hemorrhoids  . Cardiovascular stress test  06/03/2010    Persantine perfusion EF 45% mild to moderate ischemia basal and mid inferolateral region  . Cardiovascular stress test  01/01/2008    left ventricle normal,no significant ischemia  . Cardiovascular stress test  09/09/2005     possibility of ischemia inferior wall  . Tee without cardioversion N/A 01/22/2014    Procedure: TRANSESOPHAGEAL ECHOCARDIOGRAM (TEE);  Surgeon: Sanda Klein, MD;  Location: [facility] ENDOSCOPY;  Service: Cardiovascular;  Laterality: N/A;  . Coronary stent placement  03/24/2014    DES to LAD  . Transcatheter aortic valve replacement, transfemoral N/A 04/22/2014    Procedure: TRANSCATHETER AORTIC VALVE REPLACEMENT, TRANSFEMORAL;  Surgeon: [name], MD;  Location: [place];  Service: Open Heart Surgery;  Laterality: N/A;  TAVR-TF (RIGHT SIDE)  . Intraoperative transesophageal echocardiogram N/A 04/22/2014    Procedure: INTRAOPERATIVE TRANSTHORACIC ECHOCARDIOGRAM;  Surgeon: [name], MD;  Location: [place];  Service: Open Heart Surgery;  Laterality: N/A;  . Left and right heart catheterization with coronary angiogram N/A 01/08/2014    Procedure: LEFT AND RIGHT HEART CATHETERIZATION WITH CORONARY ANGIOGRAM;  Surgeon: Sanda Klein, MD;  Location: [place] CATH LAB;  Service: Cardiovascular;  Laterality: N/A;  . Percutaneous coronary rotoblator intervention (pci-r) N/A 03/24/2014    Procedure: PERCUTANEOUS CORONARY ROTOBLATOR INTERVENTION (PCI-R);  Surgeon: Blane Ohara, MD;  Location: [facility] CATH LAB;  Service: Cardiovascular;  Laterality: N/A;    Current Medications: Outpatient Prescriptions Prior to Visit  Medication Sig Dispense Refill  . clopidogrel (PLAVIX) 75 MG tablet Take 1 tablet (75 mg total) by mouth daily with breakfast. 90 tablet 3  . gabapentin (NEURONTIN) 100 MG capsule Take 1 capsule (100  mg total) by mouth at bedtime. 90 capsule 11  . methimazole (TAPAZOLE) 5 MG tablet TAKE 1 TABLET (5 MG TOTAL) BY MOUTH EVERY MONDAY, WEDNESDAY, AND FRIDAY. 36 tablet 2  . metoprolol succinate (TOPROL-XL) 50 MG 24 hr tablet Take 1 tablet (50 mg total) by mouth daily. Take with or immediately following a meal. 90 tablet 3  . Multiple Vitamin (MULTIVITAMIN WITH MINERALS) TABS Take 1 tablet by mouth daily. Centrum Silver    . pantoprazole (PROTONIX) 40 MG tablet Take 1 tablet (40 mg total) by mouth 2 (two) times daily. 180 tablet 0  . simvastatin (ZOCOR) 40 MG tablet Take 1 tablet (40 mg total) by mouth at bedtime. 90 tablet 2  . Tamsulosin HCl (FLOMAX) 0.4 MG CAPS Take 0.4 mg by mouth every evening.      No facility-administered medications prior to visit.     Allergies:   Review of patient's allergies indicates no known allergies.   Social  History   Social History  . Marital Status: Married    Spouse Name: N/A  . Number of Children: 4  . Years of Education: N/A   Occupational History  . Retired    Social History Main Topics  . Smoking status: Former Smoker -- 2 years    Types: Pipe    Quit date: 11/07/1965  . Smokeless tobacco: Never Used     Comment: quit over 40 years ago, never smoked cigarettes  . Alcohol Use: No  . Drug Use: No  . Sexual Activity: Not Asked   Other Topics Concern  . None   Social History Narrative     Family History:  The patient's family history includes Breast cancer in his mother; Stroke in his mother; Transient ischemic attack in his mother.   ROS:   Please see the history of present illness.    ROS All other systems reviewed and are negative.   PHYSICAL EXAM:   VS:  BP 121/76 mmHg  Pulse 70  Ht 5\' 10"  (1.778 m)  Wt 76.658 kg (169 lb)  BMI 24.25 kg/m2  SpO2 95%   GEN: Well nourished, well developed, in no acute distress HEENT: normal Neck: no JVD, carotid bruits, or masses Cardiac: Paradoxically split S2,R;  grade 1-2/6 aortic ejection  murmur is early peaking, there are no diastolic murmurs, rubs, or gallops,no edema ; Healthy subclavian pacemaker site  Respiratory:  clear to auscultation bilaterally, normal work of breathing GI: soft, nontender, nondistended, + BS MS: no deformity or atrophy Skin: warm and dry, no rash Neuro:  Alert and Oriented x 3, Strength and sensation are intact Psych: euthymic mood, full affect  Wt Readings from Last 3 Encounters:  03/28/16 76.658 kg (169 lb)  03/24/16 77.111 kg (170 lb)  03/14/16 77.747 kg (171 lb 6.4 oz)      Studies/Labs Reviewed:   EKG:  EKG is ordered today.  The ekg ordered today demonstrates Atrial fibrillation with 100% ventricular pacing  Recent Labs: 03/14/2016: BUN 23; Creatinine, Ser 0.81; Hemoglobin 12.6*; Platelets 145.0*; Potassium 4.1; Sodium 143; TSH 1.00   Lipid Panel    Component Value Date/Time   CHOL 120 03/14/2016 1605   TRIG 48.0 03/14/2016 1605   HDL 42.60 03/14/2016 1605   CHOLHDL 3 03/14/2016 1605   VLDL 9.6 03/14/2016 1605   LDLCALC 68 03/14/2016 1605    Additional studies/ records that were reviewed today include:  Notes from Dr. Loanne Drilling and Dr. Carlean Purl    ASSESSMENT:    1. Chronic diastolic heart failure (HCC)   2. Paroxysmal atrial tachycardia ([place])   3. Pacemaker - Medtronic Adapta, dual chamber   4. S/P mitral valve replacement with bioprosthetic valve   5. S/P TAVR (transcatheter aortic valve replacement)   6. Aortic insufficiency   7. CAD S/P percutaneous coronary angioplasty - Xience DES 3.0 mm x 8 mm prox LAD   8. Diaphragmatic hernia without obstruction and without gangrene   9. Essential hypertension   10. Hyperlipidemia   11. 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 Risk for falls      PLAN:  In order of problems listed above:  1. Chronic diastolic heart failure - he seems to be at his "dry weight" of around 170 pounds. Clinically, he has no evidence of active problems with hypervolemia. It is possible that his  variation in LV EF is related to the burden of ventricular pacing. The prevalence of ventricular pacing is decreasing.  2. Paroxysmal atrial tachycardia and  history of paroxysmal atrial fibrillation (currently in persistent atrial fibrillation for almost 2 months). He is not on anticoagulation due to GI bleeding complications. This decision proved to be highly beneficial with his recent falls The amiodarone was stopped since its benefits are outweighed by its risks and complications. It was causing more ventricular pacing. We'll keep him off the medication and have no plans for alternative antiarrhythmics or cardioversion at this time   3. Normal function of dual-chamber permanent pacemaker, programmed DDI in attempts to minimize ventricular pacing while avoiding tracking frequent episodes of brief paroxysmal atrial tachycardia. The overall burden of ventricular pacing has decreased substantially and will probably continue to go down over time. Continue remote checks every 3 months  4. Status post mitral valve biological prosthesis with normal function  5. Status post TAVR with   6.mild to moderate aortic insufficiency by most recent echocardiogram, not clinically important at this time. Repeat echocardiogram at 6 month intervals to make sure that the aortic insufficiency is a stable abnormality. Echo ordered today  7. CAD status post drug-eluting stent to proximal LAD artery June 2016. Plan to continue the clopidogrel beyond June 2017, unless he has a continued pattern of falls or other bleeding complications.   8. Very large diaphragmatic hernia with gastric and colon contents in the thoracic cavity  9. Hypertension, well controlled   10. Hyperlipidemia with excellent LDL cholesterol   11. Hyperthyroidism, possibly amiodarone related, may abate after continuation of that medication. Latest labs from May 8 showed normal TSH. He is now taking methimazole only 3 days a week under the supervision  of Dr. Lissa Merlin.  12. Falls: This seemed to be unrelated mechanical falls, neither one associated with syncope or rhythm abnormalities. Nevertheless need to keep a close eye on this elderly gentleman and at one point may decide to discontinue his clopidogrel.   Medication Adjustments/Labs and Tests Ordered: Current medicines are reviewed at length with the patient today.  Concerns regarding medicines are outlined above.  Medication changes, Labs and Tests ordered today are listed in the Patient Instructions below. Patient Instructions  Medication Instructions: Dr [name] recommends that you continue on your current medications as directed. Please refer to the Current Medication list given to you today.  Labwork: NONE ORDERED  Testing/Procedures: 1. Echocardiogram - Your physician has requested that you have an echocardiogram. Echocardiography is a painless test that uses sound waves to create images of your heart. It provides your doctor with information about the size and shape of your heart and how well your heart's chambers and valves are working. This procedure takes approximately one hour. There are no restrictions for this procedure.  2. Remote Pacemaker Download - Remote monitoring is used to monitor your Pacemaker of ICD from home. This monitoring reduces the number of office visits required to check your device to one time per year. It allows [place] to keep an eye on the functioning of your device to ensure it is working properly. You are scheduled for a device check from home on Wednesday, August 23rd, 2017. You may send your transmission at any time that day. If you have a wireless device, the transmission will be sent automatically. After your physician reviews your transmission, you will receive a postcard with your next transmission date.  Follow-up: Dr [name] recommends that you schedule a follow-up appointment in 6 months. You will receive a reminder letter in the mail two months in  advance. If you don't receive a letter, please call our office  to schedule the follow-up appointment.  If you need a refill on your cardiac medications before your next appointment, please call your pharmacy.    Signed, Sanda Klein, MD  03/28/2016 1:51 PM    [facility] [address], [place], [place]  [address] Phone: [phone]; Fax: [phone]

## 2016-03-29 LAB — CUP PACEART INCLINIC DEVICE CHECK
Battery Voltage: 2.74 V
Brady Statistic AP VS Percent: 0 %
Brady Statistic AS VP Percent: 77 %
Brady Statistic AS VS Percent: 23 %
Implantable Lead Implant Date: 20090906
Implantable Lead Implant Date: 20090906
Implantable Lead Location: 753859
Implantable Lead Model: 5076
Lead Channel Impedance Value: 405 Ohm
Lead Channel Pacing Threshold Amplitude: 0.625 V
Lead Channel Pacing Threshold Pulse Width: 0.4 ms
Lead Channel Setting Pacing Amplitude: 2 V
Lead Channel Setting Sensing Sensitivity: 4 mV
MDC IDC LEAD LOCATION: 753860
MDC IDC MSMT BATTERY IMPEDANCE: 2287 Ohm
MDC IDC MSMT BATTERY REMAINING LONGEVITY: 26 mo
MDC IDC MSMT LEADCHNL RV IMPEDANCE VALUE: 729 Ohm
MDC IDC MSMT LEADCHNL RV SENSING INTR AMPL: 11.2 mV
MDC IDC SESS DTM: 20170522130706
MDC IDC SET LEADCHNL RV PACING AMPLITUDE: 2.5 V
MDC IDC SET LEADCHNL RV PACING PULSEWIDTH: 0.4 ms
MDC IDC STAT BRADY AP VP PERCENT: 0 %

## 2016-03-30 ENCOUNTER — Encounter: Payer: Self-pay | Admitting: Cardiovascular Disease

## 2016-04-13 ENCOUNTER — Ambulatory Visit (HOSPITAL_COMMUNITY): Payer: Medicare Other | Attending: Cardiology

## 2016-04-13 ENCOUNTER — Other Ambulatory Visit: Payer: Self-pay

## 2016-04-13 DIAGNOSIS — I2510 Atherosclerotic heart disease of native coronary artery without angina pectoris: Secondary | ICD-10-CM | POA: Insufficient documentation

## 2016-04-13 DIAGNOSIS — Z953 Presence of xenogenic heart valve: Secondary | ICD-10-CM | POA: Diagnosis not present

## 2016-04-13 DIAGNOSIS — I77810 Thoracic aortic ectasia: Secondary | ICD-10-CM | POA: Diagnosis not present

## 2016-04-13 DIAGNOSIS — I351 Nonrheumatic aortic (valve) insufficiency: Secondary | ICD-10-CM | POA: Diagnosis not present

## 2016-04-13 DIAGNOSIS — I119 Hypertensive heart disease without heart failure: Secondary | ICD-10-CM | POA: Insufficient documentation

## 2016-04-13 DIAGNOSIS — I272 Other secondary pulmonary hypertension: Secondary | ICD-10-CM | POA: Insufficient documentation

## 2016-04-13 DIAGNOSIS — Z87891 Personal history of nicotine dependence: Secondary | ICD-10-CM | POA: Insufficient documentation

## 2016-04-13 DIAGNOSIS — I359 Nonrheumatic aortic valve disorder, unspecified: Secondary | ICD-10-CM | POA: Diagnosis present

## 2016-04-13 DIAGNOSIS — E785 Hyperlipidemia, unspecified: Secondary | ICD-10-CM | POA: Diagnosis not present

## 2016-04-14 ENCOUNTER — Other Ambulatory Visit: Payer: Self-pay

## 2016-04-14 MED ORDER — SIMVASTATIN 40 MG PO TABS: 40.0000 mg | ORAL_TABLET | Freq: Every day | ORAL | Status: DC

## 2016-04-28 ENCOUNTER — Telehealth: Payer: Self-pay | Admitting: Cardiovascular Disease

## 2016-04-28 NOTE — Telephone Encounter (Signed)
New message     Patein calling for echo test results

## 2016-04-28 NOTE — Telephone Encounter (Signed)
Returned call to patient. Notified him of echo results.

## 2016-05-04 ENCOUNTER — Encounter: Payer: Self-pay | Admitting: Podiatry

## 2016-05-04 ENCOUNTER — Ambulatory Visit (INDEPENDENT_AMBULATORY_CARE_PROVIDER_SITE_OTHER): Payer: Medicare Other | Admitting: Podiatry

## 2016-05-04 DIAGNOSIS — Q828 Other specified congenital malformations of skin: Secondary | ICD-10-CM

## 2016-05-04 DIAGNOSIS — M79674 Pain in right toe(s): Secondary | ICD-10-CM | POA: Diagnosis not present

## 2016-05-04 DIAGNOSIS — M79675 Pain in left toe(s): Secondary | ICD-10-CM

## 2016-05-04 DIAGNOSIS — B351 Tinea unguium: Secondary | ICD-10-CM

## 2016-05-05 NOTE — Progress Notes (Signed)
Patient ID: [name] [name], male   DOB: 12/09/1928, 82 y.o.   MRN: [id number]  Subjective: This patient presents today complaining of painful toenails walking wearing shoes and is requesting nail debridement. Also, patient is complaining of a painful nucleated plantar lesion on the left foot  Objective: No open skin lesions bilaterally The toenails are elongated, hypertrophic, discolored, brittle and tender direct palpation 6-10 Nucleated keratoses plantar left heel Small punctate keratoses right heel  Assessment: Symptomatic onychomycoses 6-10 Porokeratosis 1  Plan: Debrided toenails 10 mechanically and electrically without any bleeding Debridement porokeratosis plantar left and packed with salinocaine  Reappoint 3 months

## 2016-05-08 ENCOUNTER — Emergency Department (HOSPITAL_COMMUNITY): Payer: Medicare Other

## 2016-05-08 ENCOUNTER — Encounter (HOSPITAL_COMMUNITY): Payer: Self-pay | Admitting: *Deleted

## 2016-05-08 ENCOUNTER — Emergency Department (HOSPITAL_COMMUNITY)
Admission: EM | Admit: 2016-05-08 | Discharge: 2016-05-08 | Disposition: A | Discharge status: Home / Self Care | Payer: Medicare Other | Attending: Emergency Medicine | Admitting: Emergency Medicine

## 2016-05-08 DIAGNOSIS — Z79899 Other long term (current) drug therapy: Secondary | ICD-10-CM | POA: Insufficient documentation

## 2016-05-08 DIAGNOSIS — K449 Diaphragmatic hernia without obstruction or gangrene: Secondary | ICD-10-CM

## 2016-05-08 DIAGNOSIS — R0602 Shortness of breath: Secondary | ICD-10-CM | POA: Diagnosis present

## 2016-05-08 DIAGNOSIS — Z8546 Personal history of malignant neoplasm of prostate: Secondary | ICD-10-CM | POA: Diagnosis not present

## 2016-05-08 DIAGNOSIS — Z951 Presence of aortocoronary bypass graft: Secondary | ICD-10-CM | POA: Insufficient documentation

## 2016-05-08 DIAGNOSIS — I2510 Atherosclerotic heart disease of native coronary artery without angina pectoris: Secondary | ICD-10-CM | POA: Insufficient documentation

## 2016-05-08 DIAGNOSIS — Z87891 Personal history of nicotine dependence: Secondary | ICD-10-CM | POA: Insufficient documentation

## 2016-05-08 DIAGNOSIS — Z950 Presence of cardiac pacemaker: Secondary | ICD-10-CM | POA: Insufficient documentation

## 2016-05-08 DIAGNOSIS — I10 Essential (primary) hypertension: Secondary | ICD-10-CM | POA: Insufficient documentation

## 2016-05-08 DIAGNOSIS — E785 Hyperlipidemia, unspecified: Secondary | ICD-10-CM | POA: Insufficient documentation

## 2016-05-08 DIAGNOSIS — Z8673 Personal history of transient ischemic attack (TIA), and cerebral infarction without residual deficits: Secondary | ICD-10-CM | POA: Diagnosis not present

## 2016-05-08 DIAGNOSIS — K219 Gastro-esophageal reflux disease without esophagitis: Secondary | ICD-10-CM | POA: Diagnosis not present

## 2016-05-08 DIAGNOSIS — K2270 Barrett's esophagus without dysplasia: Secondary | ICD-10-CM

## 2016-05-08 LAB — CBC
HEMATOCRIT: 36.4 % — AB (ref 39.0–52.0)
HEMOGLOBIN: 11.8 g/dL — AB (ref 13.0–17.0)
MCH: 30.9 pg (ref 26.0–34.0)
MCHC: 32.4 g/dL (ref 30.0–36.0)
MCV: 95.3 fL (ref 78.0–100.0)
Platelets: 183 10*3/uL (ref 150–400)
RBC: 3.82 MIL/uL — ABNORMAL LOW (ref 4.22–5.81)
RDW: 13.2 % (ref 11.5–15.5)
WBC: 6.1 10*3/uL (ref 4.0–10.5)

## 2016-05-08 LAB — BASIC METABOLIC PANEL
ANION GAP: 4 — AB (ref 5–15)
BUN: 15 mg/dL (ref 6–20)
CALCIUM: 8.8 mg/dL — AB (ref 8.9–10.3)
CHLORIDE: 110 mmol/L (ref 101–111)
CO2: 28 mmol/L (ref 22–32)
Creatinine, Ser: 0.75 mg/dL (ref 0.61–1.24)
GFR calc non Af Amer: >60 mL/min (ref >60)
Glucose, Bld: 97 mg/dL (ref 65–99)
Potassium: 4.1 mmol/L (ref 3.5–5.1)
Sodium: 142 mmol/L (ref 135–145)

## 2016-05-08 LAB — POC OCCULT BLOOD, ED: FECAL OCCULT BLD: NEGATIVE

## 2016-05-08 LAB — I-STAT TROPONIN, ED: TROPONIN I, POC: 0 ng/mL (ref 0.00–0.08)

## 2016-05-08 MED ORDER — PANTOPRAZOLE SODIUM 40 MG PO TBEC
40.0000 mg | DELAYED_RELEASE_TABLET | Freq: Once | ORAL | Status: AC
  Administered 2016-05-08: 40 mg via ORAL
  Filled 2016-05-08: qty 1

## 2016-05-08 MED ORDER — PANTOPRAZOLE SODIUM 40 MG PO TBEC: 40.0000 mg | DELAYED_RELEASE_TABLET | Freq: Every day | ORAL | Status: DC

## 2016-05-08 NOTE — ED Notes (Signed)
Pt requests to go to restroom prior to being placed in his room

## 2016-05-08 NOTE — Discharge Instructions (Signed)
Please read and follow all provided instructions.  Your diagnoses today include:  1. Gastroesophageal reflux disease, esophagitis presence not specified   2. Hiatal hernia    Tests performed today include:  Vital signs. See below for your results today.   Medications prescribed:   Take as prescribed   Home care instructions:  Follow any educational materials contained in this packet.  Follow-up instructions: Please follow-up with your primary care provider for further evaluation of symptoms and treatment   Return instructions:   Please return to the Emergency Department if you do not get better, if you get worse, or new symptoms OR  - Fever (temperature greater than 101.78F)  - Bleeding that does not stop with holding pressure to the area    -Severe pain (please note that you may be more sore the day after your accident)  - Chest Pain  - Difficulty breathing  - Severe nausea or vomiting  - Inability to tolerate food and liquids  - Passing out  - Skin becoming red around your wounds  - Change in mental status (confusion or lethargy)  - New numbness or weakness     Please return if you have any other emergent concerns.  Additional Information:  Your vital signs today were: BP 159/94 mmHg   Pulse 74   Temp(Src) 97.3 F (36.3 C) (Oral)   Resp 20   SpO2 97% If your blood pressure (BP) was elevated above 135/85 this visit, please have this repeated by your doctor within one month. ---------------

## 2016-05-08 NOTE — ED Notes (Signed)
Pt c/o SOB onset x 2 wks with dizziness associated with position change, pt denies CP, n/v/d, pt reports hiatal hernia & hx of reflux, pt denies cough, A&O x4

## 2016-05-08 NOTE — ED Provider Notes (Signed)
CSN: [id number]     Arrival date & time 05/08/16  [id number] History   First MD Initiated Contact with Patient 05/08/16 1001     Chief Complaint  Patient presents with  . Shortness of Breath  . Dizziness   (Consider location/radiation/quality/duration/timing/severity/associated sxs/prior Treatment) HPI 82 y.o. male with a hx of HTN, HLD, Chronic A Fib and is only on Plavix, Barretts Esophagus, Mitral Valve Replacement in 2009, aortic valve replacement s/p TAVR, Pacemaker, presents to the Emergency Department today complaining of shortness of breath x 2 weeks. States that the shortness of breath is mainly brought on by position. States that leaning forward makes symptoms worse. No CP/ABD pain. No N/V/D. No fevers. Having normal BMs. No headaches. No vision changes. Has had recent echo on 04-13-16 with EF 55%. No other symptoms noted.   Past Medical History  Diagnosis Date  . GASTRIC POLYP 05/13/2009  . COLONIC POLYPS, ADENOMATOUS 01/14/2008  . HYPERLIPIDEMIA 06/01/2007  . ANEMIA, IRON DEFICIENCY 10/13/2008  . COAGULOPATHY, COUMADIN-INDUCED 01/14/2008  . ANXIETY 09/04/2008  . HYPERTENSION 06/01/2007  . ATRIAL FIBRILLATION, CHRONIC 01/14/2008    on warfarin since 2007  . UNSPECIFIED VENOUS INSUFFICIENCY 09/18/2008  . PLEURAL EFFUSION, LEFT 10/13/2008  . GERD 06/01/2007  . BARRETTS ESOPHAGUS 03/20/2009  . PROSTATE CANCER, HX OF 01/14/2008  . CATARACTS, BILATERAL, HX OF 01/14/2008  . CEREBROVASCULAR ACCIDENT, HX OF 08/18/2008  . PERSONAL HX COLONIC POLYPS 01/01/2010  . BASAL CELL CARCINOMA OF SKIN SITE UNSPECIFIED 09/28/2010  . Hyperglycemia   . Elevated PSA   . Restless leg syndrome   . Mitral regurgitation 06/13/2008    mitral valve replacement #20 St. Jude Biocor; Maze procedure by Dr. Amador Cunas  at [facility]  . CORONARY ARTERY DISEASE 06/01/2007    Cath 04/03/2008; Cath 11/01/1990  . Tachy-brady syndrome ([place]) 07/13/2008    medtronic Adapta gen. model ADDR01 ,serial # [id number] by Dr  [name] at [facility]   . Peripheral artery disease ([place]) 06/16/2010    LEA doppler-left ABI nml 0.61 calcified vessels;right ABI  0.68  . Edema, peripheral 07/30/2009    LEV doppler- no thrombus or thrombophlebitis  . Aortic valvular disorder 08/16/2012    echo EF 45-50% LV mildly reduce,Biocor mitral valve,mild to mod. tricuspid regrug.,mild to mod. aortic regrug.  . Mitral valve disorder 09/08/2011    echo EF 50-55% LV normal  . Aortic valvular stenosis 11/19/2013  . Cardiomyopathy, ischemic 11/19/2013    EF 45-50% with inferior wall hypokinesis by echo 2014  . S/P CABG x 1 06/13/2008    SVG to LAD with open vein harvest right thigh, LIMA harvested but not utilized by Dr Amador Cunas  . S/P mitral valve replacement with bioprosthetic valve 06/13/2008    15mm St Jude Biocor porcine bioprosthetic tissue valve by Dr Amador Cunas  . S/P Maze operation for atrial fibrillation 06/13/2008    Partial left atrial lesion set using cryothermy for bilateral pulmonary vein isolation by Dr Amador Cunas  . Prosthetic valve dysfunction     Mitral stenosis involving bioprosthetic tissue valve placed 06/13/2008  . Mitral stenosis     Porcine bioprosthetic tissue valve placed 06/13/2008  . Dysrhythmia     HX OF TACHY BRADY  . Pacemaker   . HYPERTHYROIDISM 01/06/2011  . 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 Prostatectomy    . Tonsillectomy    .  Cystectomy      Left leg  . Pacemaker placement  07/13/2008    [place] dual chamber-Medtronic at [facility]   . Coronary artery bypass graft  06/13/2008    graft x1 w/vein graft LAD artery by Dr. Amador Cunas at [facility]  . Mitral valve replacement  06/13/2008    #20 St. Jude Bicor mitral valve ;Maze procedure at [facility] by Dr. Amador Cunas  . Cardiac catheterization  11/01/1990  . Skin cancer excision      removed from forehead and right leg,  nose/face  . Upper gastrointestinal endoscopy  07/14/2009    erosive reflux esopagitis, Barrett's esophagus  . Colonoscopy  07/14/2009    diverticulosis, internal hemorrhoids  . Cardiovascular stress test  06/03/2010    Persantine perfusion EF 45% mild to moderate ischemia basal and mid inferolateral region  . Cardiovascular stress test  01/01/2008    left ventricle normal,no significant ischemia  . Cardiovascular stress test  09/09/2005     possibility of ischemia inferior wall  . Tee without cardioversion N/A 01/22/2014    Procedure: TRANSESOPHAGEAL ECHOCARDIOGRAM (TEE);  Surgeon: Sanda Klein, MD;  Location: [facility] ENDOSCOPY;  Service: Cardiovascular;  Laterality: N/A;  . Coronary stent placement  03/24/2014    DES to LAD  . Transcatheter aortic valve replacement, transfemoral N/A 04/22/2014    Procedure: TRANSCATHETER AORTIC VALVE REPLACEMENT, TRANSFEMORAL;  Surgeon: [name], MD;  Location: [place];  Service: Open Heart Surgery;  Laterality: N/A;  TAVR-TF (RIGHT SIDE)  . Intraoperative transesophageal echocardiogram N/A 04/22/2014    Procedure: INTRAOPERATIVE TRANSTHORACIC ECHOCARDIOGRAM;  Surgeon: [name], MD;  Location: [place];  Service: Open Heart Surgery;  Laterality: N/A;  . Left and right heart catheterization with coronary angiogram N/A 01/08/2014    Procedure: LEFT AND RIGHT HEART CATHETERIZATION WITH CORONARY ANGIOGRAM;  Surgeon: Sanda Klein, MD;  Location: [place] CATH LAB;  Service: Cardiovascular;  Laterality: N/A;  . Percutaneous coronary rotoblator intervention (pci-r) N/A 03/24/2014    Procedure: PERCUTANEOUS CORONARY ROTOBLATOR INTERVENTION (PCI-R);  Surgeon: Blane Ohara, MD;  Location: [facility] CATH LAB;  Service: Cardiovascular;  Laterality: N/A;   Family History  Problem Relation Age of Onset  . Breast cancer Mother   . Stroke Mother   . Transient ischemic attack Mother    Social History  Substance Use Topics  . Smoking status: Former Smoker -- 2 years    Types:  Pipe    Quit date: 11/07/1965  . Smokeless tobacco: Never Used     Comment: quit over 40 years ago, never smoked cigarettes  . Alcohol Use: No    Review of Systems ROS reviewed and all are negative for acute change except as noted in the HPI.  Allergies  Review of patient's allergies indicates no known allergies.  Home Medications   Prior to Admission medications   Medication Sig Start Date End Date Taking? Authorizing Provider  clopidogrel (PLAVIX) 75 MG tablet Take 1 tablet (75 mg total) by mouth daily with breakfast. 10/15/15   [name] [name], MD  gabapentin (NEURONTIN) 100 MG capsule Take 1 capsule (100 mg total) by mouth at bedtime. 01/12/16   [name], MD  methimazole (TAPAZOLE) 5 MG tablet TAKE 1 TABLET (5 MG TOTAL) BY MOUTH EVERY MONDAY, WEDNESDAY, AND FRIDAY. 03/31/15   [name], MD  metoprolol succinate (TOPROL-XL) 50 MG 24 hr tablet Take 1 tablet (50 mg total) by mouth daily. Take with or immediately following a meal. 10/15/15   [name] [name], MD  Multiple Vitamin (MULTIVITAMIN WITH MINERALS) TABS  Take 1 tablet by mouth daily. Centrum Silver    Historical Provider, MD  pantoprazole (PROTONIX) 40 MG tablet Take 1 tablet (40 mg total) by mouth 2 (two) times daily. 02/03/16   Gatha Mayer, MD  simvastatin (ZOCOR) 40 MG tablet Take 1 tablet (40 mg total) by mouth at bedtime. 04/14/16   [name], MD  Tamsulosin HCl (FLOMAX) 0.4 MG CAPS Take 0.4 mg by mouth every evening.     Historical Provider, MD   BP 140/92 mmHg  Pulse 75  Temp(Src) 97.3 F (36.3 C) (Oral)  Resp 25  SpO2 95%   Physical Exam  Constitutional: He is oriented to person, place, and time. He appears well-developed and well-nourished.  HENT:  Head: Normocephalic and atraumatic.  Eyes: EOM are normal. Pupils are equal, round, and reactive to light.  Neck: Normal range of motion. Neck supple. No tracheal deviation present.  Cardiovascular: Normal rate, regular rhythm, normal heart sounds and intact  distal pulses.   No murmur heard. Pulmonary/Chest: Effort normal and breath sounds normal. No respiratory distress. He has no wheezes. He has no rales. He exhibits no tenderness.  Abdominal: Soft. Normal appearance and bowel sounds are normal. There is no tenderness. There is no rigidity, no rebound, no guarding, no tenderness at McBurney's point and negative Murphy's sign.  Musculoskeletal: Normal range of motion.  Neurological: He is alert and oriented to person, place, and time.  Skin: Skin is warm and dry.  Psychiatric: He has a normal mood and affect. His behavior is normal. Thought content normal.  Nursing note and vitals reviewed.  ED Course  Procedures (including critical care time) Labs Review Labs Reviewed  CBC - Abnormal; Notable for the following:    RBC 3.82 (*)    Hemoglobin 11.8 (*)    HCT 36.4 (*)    All other components within normal limits  BASIC METABOLIC PANEL - Abnormal; Notable for the following:    Calcium 8.8 (*)    Anion gap 4 (*)    All other components within normal limits  I-STAT TROPOININ, ED  POC OCCULT BLOOD, ED  POC OCCULT BLOOD, ED   Imaging Review Dg Chest 2 View  05/08/2016  CLINICAL DATA:  Difficulty breathing and abdominal pain EXAM: CHEST  2 VIEW COMPARISON:  04/03/2015 FINDINGS: Large hiatal hernia is again identified. Pacing device is again seen and stable. No focal infiltrate or sizable effusion is noted. Changes of prior TAVR are noted. No acute bony abnormality is seen. 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 IMPRESSION: Large hiatal hernia stable from the prior exam. Electronically Signed   By: Inez Catalina M.D.   On: 05/08/2016 10:24   I have personally reviewed and evaluated these images and lab results as part of my medical decision-making.   EKG Interpretation   Date/Time:  Sunday May 08 2016 09:53:45 EDT Ventricular Rate:  87 PR Interval:    QRS Duration: 100 QT Interval:  418 QTC Calculation: 503 R Axis:    42 Text Interpretation:  Sinus rhythm Anteroseptal infarct, age indeterminate  Repol abnrm, severe global ischemia (LM/MVD) Prolonged QT interval  Confirmed by Winfred Leeds  MD, SAM [phone]) on 05/08/2016 9:59:55 AM      MDM  I have reviewed and evaluated the relevant laboratory valuesI have reviewed and evaluated the relevant imaging studies. I have interpreted the relevant EKG.I have reviewed the relevant previous healthcare records.I obtained HPI from historian. Patient discussed with supervising physician  ED Course:  Assessment:  Pt is a 87yM with hx HTN, HLD, Chronic A Fib and is only on Plavix, Barretts Esophagus, Mitral Valve Replacement in 2009, aortic valve replacement s/p TAVR, Pacemaker who presents with shortness of breath x 2 weeks. On exam, pt in NAD. Nontoxic/nonseptic appearing. VSS. Afebrile. Lungs CTA. Heart RRR. Abdomen nontender soft. Likely hiatal hernia playing a role in shortness of breath as it is only positional with bending forward. No shortness of breath with exertion. Labs otherwise unremarkable. EKG unremarkable with no changes from previous. CXR with stable hiatal hernia. Pt seen and evaluated by supervising physician. Likely hernia causing symptoms due to positional onset. Questioned patient and he does not take reflux medication. Will Rx Protonix. Plan is to DC home with follow up to PCP. At time of discharge, Patient is in no acute distress. Vital Signs are stable. Patient is able to ambulate. Patient able to tolerate PO.    Disposition/Plan:  DC Home Additional Verbal discharge instructions given and discussed with patient.  Pt Instructed to f/u with PCP in the next week for evaluation and treatment of symptoms. Return precautions given Pt acknowledges and agrees with plan  Supervising Physician Orlie Dakin, MD   Final diagnoses:  Hiatal hernia  Gastroesophageal reflux disease, esophagitis presence not specified    Shary Decamp, PA-C 05/08/16 [place], MD 05/08/16 1704

## 2016-05-08 NOTE — ED Provider Notes (Signed)
Patient reports he feels lightheaded and short of breath only when he flexes at the waist for the past 2 weeks. He also reports vague abdominal discomfort at epigastrium with eating for the past 2 weeks. He denies blood per rectum and denies chest pain denies fever denies cough no other associated symptoms. No treatment prior to coming here. On exam no distress lungs clear auscultation heart regular rate and rhythm abdomen nondistended nontender bilateral lower extremities with chronic appearing brawny skin changes. No edema. Neurovascular intact. Rectal normal tone brown stool Hemoccult negative I suspect patient is symptomatic from hiatal hernia  Orlie Dakin, MD 05/08/16 1704

## 2016-05-23 ENCOUNTER — Telehealth: Payer: Self-pay | Admitting: Cardiovascular Disease

## 2016-05-23 NOTE — Telephone Encounter (Signed)
He does have a enormous hernia. Try sleeping more upright, in a recliner maybe. Avoid large meals

## 2016-05-23 NOTE — Telephone Encounter (Signed)
New Message   Pt c/o Shortness Of Breath: STAT if SOB developed within the last 24 hours or pt is noticeably SOB on the phone  1. Are you currently SOB (can you hear that pt is SOB on the phone)? Yes   2. How long have you been experiencing SOB? Pt states it has been some weeks   3. Are you SOB when sitting or when up moving around? Sitting and moving around   4. Are you currently experiencing any other symptoms? No  Please call back to discuss

## 2016-05-23 NOTE — Telephone Encounter (Signed)
I spoke with the pt and made him aware of Dr [name]'s recommendation.

## 2016-05-23 NOTE — Telephone Encounter (Signed)
I spoke with the pt and he complains of SOB related to position changes. The pt said he has a hernia which is possibly effecting his breathing and this has been on going for a few weeks.  The pt did seek evaluation in the ER 05/08/16 for SOB and dizziness. Per ER evaluation hernia was felt to be contributing to SOB. The pt states he has SOB with bending over and with his first gulp of liquid he becomes SOB. The pt denies any SOB with exertion and no swelling. I advised the pt that he should touch base with his PCP or GI physician. Pt agreed with plan.

## 2016-05-24 ENCOUNTER — Other Ambulatory Visit: Payer: Self-pay

## 2016-05-24 DIAGNOSIS — K2270 Barrett's esophagus without dysplasia: Secondary | ICD-10-CM

## 2016-05-24 DIAGNOSIS — K219 Gastro-esophageal reflux disease without esophagitis: Secondary | ICD-10-CM

## 2016-05-24 MED ORDER — PANTOPRAZOLE SODIUM 40 MG PO TBEC: 40.0000 mg | DELAYED_RELEASE_TABLET | Freq: Every day | ORAL | Status: DC

## 2016-05-24 NOTE — Telephone Encounter (Signed)
Ok to refill per Dr Gessner. 

## 2016-05-26 ENCOUNTER — Telehealth: Payer: Self-pay | Admitting: Endocrinology

## 2016-05-26 MED ORDER — SIMVASTATIN 40 MG PO TABS: 40.0000 mg | ORAL_TABLET | Freq: Every day | ORAL | Status: DC

## 2016-05-26 NOTE — Telephone Encounter (Signed)
PT needs Simvastatin called into [facility].

## 2016-05-26 NOTE — Telephone Encounter (Signed)
Rx submitted per pt's request.  

## 2016-06-28 ENCOUNTER — Ambulatory Visit (INDEPENDENT_AMBULATORY_CARE_PROVIDER_SITE_OTHER): Payer: Medicare Other | Admitting: *Deleted

## 2016-06-28 DIAGNOSIS — I495 Sick sinus syndrome: Secondary | ICD-10-CM

## 2016-06-28 NOTE — Progress Notes (Signed)
Remote pacemaker transmission.   

## 2016-07-01 ENCOUNTER — Encounter: Payer: Self-pay | Admitting: Cardiology

## 2016-07-04 ENCOUNTER — Encounter: Payer: Self-pay | Admitting: Cardiovascular Disease

## 2016-07-04 NOTE — Progress Notes (Signed)
[name] [name] is having increasing difficulty with his breathing, especially when supine. He has multiple reasons for ventilatory problems (thoracic kyphosis, previous smoking), but primarily his dyspnea is probably related to a very large diaphragmatic hernia with compression of his lungs. I think he would greatly benefit from an adjustable bed/hospital bed to allow him to sleep sitting up. We'll try to see if his insurance can help with this.

## 2016-07-07 LAB — CUP PACEART REMOTE DEVICE CHECK
Battery Impedance: 2413 Ohm
Brady Statistic AP VS Percent: 0 %
Brady Statistic AS VP Percent: 44 %
Brady Statistic AS VS Percent: 56 %
Implantable Lead Implant Date: 20090906
Implantable Lead Location: 753859
Implantable Lead Location: 753860
Implantable Lead Model: 5076
Lead Channel Impedance Value: 447 Ohm
Lead Channel Impedance Value: 771 Ohm
Lead Channel Pacing Threshold Amplitude: 0.625 V
Lead Channel Pacing Threshold Pulse Width: 0.4 ms
Lead Channel Setting Pacing Pulse Width: 0.4 ms
MDC IDC LEAD IMPLANT DT: 20090906
MDC IDC MSMT BATTERY REMAINING LONGEVITY: 25 mo
MDC IDC MSMT BATTERY VOLTAGE: 2.74 V
MDC IDC MSMT LEADCHNL RV SENSING INTR AMPL: 8 mV
MDC IDC SESS DTM: 20170822124652
MDC IDC SET LEADCHNL RA PACING AMPLITUDE: 2 V
MDC IDC SET LEADCHNL RV PACING AMPLITUDE: 2.5 V
MDC IDC SET LEADCHNL RV SENSING SENSITIVITY: 4 mV
MDC IDC STAT BRADY AP VP PERCENT: 0 %

## 2016-07-12 ENCOUNTER — Telehealth: Payer: Self-pay | Admitting: Internal Medicine

## 2016-07-12 ENCOUNTER — Telehealth: Payer: Self-pay | Admitting: Endocrinology

## 2016-07-12 NOTE — Telephone Encounter (Signed)
See message to be advised, Thanks! 

## 2016-07-12 NOTE — Telephone Encounter (Signed)
Patient wife called stated that he is having trouble breathing, I told patient to go to the Er

## 2016-07-12 NOTE — Telephone Encounter (Signed)
error 

## 2016-07-15 ENCOUNTER — Encounter: Payer: Self-pay | Admitting: Cardiology

## 2016-07-15 ENCOUNTER — Telehealth: Payer: Self-pay | Admitting: *Deleted

## 2016-07-15 NOTE — Telephone Encounter (Signed)
[facility] requesting call back.  Gave device clinic phone number for return call.  Will request that patient schedule device clinic appointment for mode reprogramming per Dr. Victorino December recommendation.

## 2016-07-15 NOTE — Telephone Encounter (Signed)
-----   Message from Sanda Klein, MD sent at 07/10/2016  1:06 PM EDT ----- Remote reviewed.   Not pacemaker dependent. Battery status is good. Lead measurements are stable. Heart rate histogram is very blunted. No clinically significant episodes of high ventricular rate. High burden of atrial fibrillation during last few months (63%), improved in last two weeks. Will reprogram DDIR.[name] [name] Not a cardioversion candidate due to inability to anticoagulate.  [name] [name], could we please make a non-urgent device clinic appointment for reprogramming, please?

## 2016-07-15 NOTE — Telephone Encounter (Signed)
Patient returned phone call.  He is agreeable to device clinic appointment on 07/21/16 at 11:00am for mode reprogramming.  He is aware that this appointment is at the [facility] office.  Patient is appreciative of call and denies additional questions or concerns at this time.

## 2016-07-21 ENCOUNTER — Ambulatory Visit (INDEPENDENT_AMBULATORY_CARE_PROVIDER_SITE_OTHER): Payer: Medicare Other | Admitting: *Deleted

## 2016-07-21 DIAGNOSIS — I495 Sick sinus syndrome: Secondary | ICD-10-CM | POA: Diagnosis not present

## 2016-07-21 LAB — CUP PACEART INCLINIC DEVICE CHECK
Battery Impedance: 2410 Ohm
Brady Statistic AP VP Percent: 0.2 %
Brady Statistic AP VS Percent: 0.1 % — CL
Brady Statistic AS VP Percent: 34.2 %
Date Time Interrogation Session: 20170914172949
Implantable Lead Implant Date: 20090906
Implantable Lead Model: 5076
Implantable Lead Model: 5076
Lead Channel Impedance Value: 457 Ohm
Lead Channel Sensing Intrinsic Amplitude: 1.4 mV
Lead Channel Setting Pacing Amplitude: 2.5 V
Lead Channel Setting Pacing Pulse Width: 0.4 ms
MDC IDC LEAD IMPLANT DT: 20090906
MDC IDC LEAD LOCATION: 753859
MDC IDC LEAD LOCATION: 753860
MDC IDC MSMT BATTERY REMAINING LONGEVITY: 25 mo
MDC IDC MSMT BATTERY VOLTAGE: 2.74 V
MDC IDC MSMT LEADCHNL RV IMPEDANCE VALUE: 728 Ohm
MDC IDC MSMT LEADCHNL RV PACING THRESHOLD AMPLITUDE: 0.75 V
MDC IDC MSMT LEADCHNL RV PACING THRESHOLD PULSEWIDTH: 0.4 ms
MDC IDC MSMT LEADCHNL RV SENSING INTR AMPL: 8 mV
MDC IDC SET LEADCHNL RA PACING AMPLITUDE: 2 V
MDC IDC SET LEADCHNL RV SENSING SENSITIVITY: 4 mV
MDC IDC STAT BRADY AS VS PERCENT: 65.6 %

## 2016-07-21 NOTE — Progress Notes (Signed)
Pacemaker check in clinic. Normal device function. Threshold, sensing, impedances consistent with previous measurements. Device programmed to maximize longevity. 58.7% AT/AF burden + Plavix. (2) high ventricular rates noted--max dur. 4 sec. Device programmed at appropriate safety margins. Histogram distribution appears blunted for patient's stated activity level--RR turned on this ov. Device programmed to optimize intrinsic conduction. Estimated longevity 25 months. Patient will follow up with [facility] in 3 months.

## 2016-07-26 ENCOUNTER — Telehealth: Payer: Self-pay | Admitting: *Deleted

## 2016-07-26 NOTE — Telephone Encounter (Signed)
[name] [name] calling to report that he feels no better since PPM adjustment 07/21/16. Rate response was turned on due to blunted histogram.  I have recommended that he keep his scheduled follow up with Dr. Loletha Grayer, as I'm not sure there are further adjustments to be made to his device. He would like to have his f/u moved earlier than 10/19/16 with Dr. Loletha Grayer. I will send to [facility] scheduling for arrangement. Mr. Schu is appreciative.

## 2016-07-29 ENCOUNTER — Other Ambulatory Visit: Payer: Self-pay | Admitting: *Deleted

## 2016-07-29 MED ORDER — CLOPIDOGREL BISULFATE 75 MG PO TABS: 75.0000 mg | ORAL_TABLET | Freq: Every day | ORAL | 3 refills | Status: AC

## 2016-08-04 ENCOUNTER — Ambulatory Visit (INDEPENDENT_AMBULATORY_CARE_PROVIDER_SITE_OTHER): Payer: Medicare Other | Admitting: Endocrinology

## 2016-08-04 VITALS — BP 110/62 | HR 86 | Ht 70.0 in | Wt 163.0 lb

## 2016-08-04 DIAGNOSIS — E0580 Other thyrotoxicosis without thyrotoxic crisis or storm: Secondary | ICD-10-CM | POA: Diagnosis not present

## 2016-08-04 DIAGNOSIS — D509 Iron deficiency anemia, unspecified: Secondary | ICD-10-CM

## 2016-08-04 DIAGNOSIS — Z0000 Encounter for general adult medical examination without abnormal findings: Secondary | ICD-10-CM

## 2016-08-04 DIAGNOSIS — Z23 Encounter for immunization: Secondary | ICD-10-CM | POA: Diagnosis not present

## 2016-08-04 LAB — CBC WITH DIFFERENTIAL/PLATELET
BASOS ABS: 0 10*3/uL (ref 0.0–0.1)
Basophils Relative: 0.3 % (ref 0.0–3.0)
EOS PCT: 3.3 % (ref 0.0–5.0)
Eosinophils Absolute: 0.2 10*3/uL (ref 0.0–0.7)
HEMATOCRIT: 37.4 % — AB (ref 39.0–52.0)
Hemoglobin: 12.6 g/dL — ABNORMAL LOW (ref 13.0–17.0)
LYMPHS ABS: 1 10*3/uL (ref 0.7–4.0)
LYMPHS PCT: 17 % (ref 12.0–46.0)
MCHC: 33.7 g/dL (ref 30.0–36.0)
MCV: 90.4 fl (ref 78.0–100.0)
MONOS PCT: 7.5 % (ref 3.0–12.0)
Monocytes Absolute: 0.4 10*3/uL (ref 0.1–1.0)
NEUTROS ABS: 4.1 10*3/uL (ref 1.4–7.7)
NEUTROS PCT: 71.9 % (ref 43.0–77.0)
Platelets: 149 10*3/uL — ABNORMAL LOW (ref 150.0–400.0)
RBC: 4.13 Mil/uL — AB (ref 4.22–5.81)
RDW: 15 % (ref 11.5–15.5)
WBC: 5.7 10*3/uL (ref 4.0–10.5)

## 2016-08-04 LAB — IBC PANEL
Iron: 65 ug/dL (ref 42–165)
SATURATION RATIOS: 25.2 % (ref 20.0–50.0)
Transferrin: 184 mg/dL — ABNORMAL LOW (ref 212.0–360.0)

## 2016-08-04 LAB — TSH: TSH: 1.52 u[IU]/mL (ref 0.35–4.50)

## 2016-08-04 NOTE — Patient Instructions (Signed)
Please consider these measures for your health:  minimize alcohol.  Do not use tobacco products.  Have a colonoscopy at least every 10 years from age 80.  Keep firearms safely stored.  Always use seat belts.  have working smoke alarms in your home.  See an eye doctor and dentist regularly.  Never drive under the influence of alcohol or drugs (including prescription drugs).  Those with fair skin should take precautions against the sun, and should carefully examine their skin once per month, for any new or changed moles. please let me know what your wishes would be, if artificial life support measures should become necessary.  It is critically important to prevent falling down (keep floor areas well-lit, dry, and free of loose objects.  If you have a cane, walker, or wheelchair, you should use it, even for short trips around the house.  Wear flat-soled shoes.  Also, try not to rush).   blood tests are requested for you today.  We'll let you know about the results. Please come back for a follow-up appointment in 6 months.

## 2016-08-04 NOTE — Progress Notes (Signed)
Subjective:    Patient ID: [name] [name], male    DOB: 08/03/1929, 81 y.o.   MRN: [id number]  HPI Pt is here for regular wellness examination, and is feeling pretty well in general, and says chronic med probs are stable, except as noted below.   Past Medical History:  Diagnosis Date  . ANEMIA, IRON DEFICIENCY 10/13/2008  . ANXIETY 09/04/2008  . Aortic valvular disorder 08/16/2012   echo EF 45-50% LV mildly reduce,Biocor mitral valve,mild to mod. tricuspid regrug.,mild to mod. aortic regrug.  . Aortic valvular stenosis 11/19/2013  . ATRIAL FIBRILLATION, CHRONIC 01/14/2008   on warfarin since 2007  . BARRETTS ESOPHAGUS 03/20/2009  . BASAL CELL CARCINOMA OF SKIN SITE UNSPECIFIED 09/28/2010  . Cardiomyopathy, ischemic 11/19/2013   EF 45-50% with inferior wall hypokinesis by echo 2014  . CATARACTS, BILATERAL, HX OF 01/14/2008  . CEREBROVASCULAR ACCIDENT, HX OF 08/18/2008  . COAGULOPATHY, COUMADIN-INDUCED 01/14/2008  . COLONIC POLYPS, ADENOMATOUS 01/14/2008  . CORONARY ARTERY DISEASE 06/01/2007   Cath 04/03/2008; Cath 11/01/1990  . Dysrhythmia    HX OF [place]  . Edema, peripheral 07/30/2009   LEV doppler- no thrombus or thrombophlebitis  . Elevated PSA   . GASTRIC POLYP 05/13/2009  . GERD 06/01/2007  . Hyperglycemia   . HYPERLIPIDEMIA 06/01/2007  . HYPERTENSION 06/01/2007  . HYPERTHYROIDISM 01/06/2011  . Mitral regurgitation 06/13/2008   mitral valve replacement #20 St. Jude Biocor; Maze procedure by Dr. Amador Cunas  at [facility]  . Mitral stenosis    Porcine bioprosthetic tissue valve placed 06/13/2008  . Mitral valve disorder 09/08/2011   echo EF 50-55% LV normal  . Pacemaker   . Peripheral artery disease ([place]) 06/16/2010   LEA doppler-left ABI nml 0.61 calcified vessels;right ABI  0.68  . PERSONAL HX COLONIC POLYPS 01/01/2010  . PLEURAL EFFUSION, LEFT 10/13/2008  . PROSTATE CANCER, HX OF 01/14/2008  . Prosthetic valve dysfunction    Mitral stenosis involving bioprosthetic tissue valve placed  06/13/2008  . Restless leg syndrome   . S/P CABG x 1 06/13/2008   SVG to LAD with open vein harvest right thigh, LIMA harvested but not utilized by Dr Amador Cunas  . S/P Maze operation for atrial fibrillation 06/13/2008   Partial left atrial lesion set using cryothermy for bilateral pulmonary vein isolation by Dr Amador Cunas  . S/P mitral valve replacement with bioprosthetic valve 06/13/2008   31mm St Jude Biocor porcine bioprosthetic tissue valve by Dr Amador Cunas  . 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 Tachy-brady syndrome ([place]) 07/13/2008   medtronic Adapta gen. model ADDR01 ,serial # [id number] H by Dr  [name] at [facility]  . UNSPECIFIED VENOUS INSUFFICIENCY 09/18/2008    Past Surgical History:  Procedure Laterality Date  . CARDIAC CATHETERIZATION  11/01/1990  . CARDIOVASCULAR STRESS TEST  06/03/2010   Persantine perfusion EF 45% mild to moderate ischemia basal and mid inferolateral region  . CARDIOVASCULAR STRESS TEST  01/01/2008   left ventricle normal,no significant ischemia  . CARDIOVASCULAR STRESS TEST  09/09/2005    possibility of ischemia inferior wall  . COLONOSCOPY  07/14/2009   diverticulosis, internal hemorrhoids  . CORONARY ARTERY BYPASS GRAFT  06/13/2008   graft x1 w/vein graft LAD artery by Dr. Amador [facility]unas at [facility]  . CORONARY STENT PLACEMENT  03/24/2014   DES to LAD  . CYSTECTOMY     Left leg  . INTRAOPERATIVE TRANSESOPHAGEAL ECHOCARDIOGRAM N/A  04/22/2014   Procedure: INTRAOPERATIVE TRANSTHORACIC ECHOCARDIOGRAM;  Surgeon: [name], MD;  Location: [place];  Service: Open Heart Surgery;  Laterality: N/A;  . LEFT AND RIGHT HEART CATHETERIZATION WITH CORONARY ANGIOGRAM N/A 01/08/2014   Procedure: LEFT AND RIGHT HEART CATHETERIZATION WITH CORONARY ANGIOGRAM;  Surgeon: Sanda Klein, MD;  Location: [place] CATH LAB;  Service: Cardiovascular;  Laterality: N/A;  . MITRAL  VALVE REPLACEMENT  06/13/2008   #20 St. Jude Bicor mitral valve ;Maze procedure at [facility] by Dr. Amador Cunas  . PACEMAKER PLACEMENT  07/13/2008   [place] dual chamber-Medtronic at [facility]   . PERCUTANEOUS CORONARY ROTOBLATOR INTERVENTION (PCI-R) N/A 03/24/2014   Procedure: PERCUTANEOUS CORONARY ROTOBLATOR INTERVENTION (PCI-R);  Surgeon: Blane Ohara, MD;  Location: [facility] CATH LAB;  Service: Cardiovascular;  Laterality: N/A;  . PROSTATECTOMY    . SKIN CANCER EXCISION     removed from forehead and right leg, nose/face  . TEE WITHOUT CARDIOVERSION N/A 01/22/2014   Procedure: TRANSESOPHAGEAL ECHOCARDIOGRAM (TEE);  Surgeon: Sanda Klein, MD;  Location: [facility] ENDOSCOPY;  Service: Cardiovascular;  Laterality: N/A;  . TONSILLECTOMY    . TRANSCATHETER AORTIC VALVE REPLACEMENT, TRANSFEMORAL N/A 04/22/2014   Procedure: TRANSCATHETER AORTIC VALVE REPLACEMENT, TRANSFEMORAL;  Surgeon: [name], MD;  Location: [place];  Service: Open Heart Surgery;  Laterality: N/A;  TAVR-TF (RIGHT SIDE)  . UPPER GASTROINTESTINAL ENDOSCOPY  07/14/2009   erosive reflux esopagitis, Barrett's esophagus    Social History   Social History  . Marital status: Married    Spouse name: N/A  . Number of children: 4  . Years of education: N/A   Occupational History  . Retired Retired   Social History Main Topics  . Smoking status: Former Smoker    Years: 2.00    Types: Pipe    Quit date: 11/07/1965  . Smokeless tobacco: Never Used     Comment: quit over 40 years ago, never smoked cigarettes  . Alcohol use No  . Drug use: No  . Sexual activity: Not on file   Other Topics Concern  . Not on file   Social History Narrative  . No narrative on file    Current Outpatient Prescriptions on File Prior to Visit  Medication Sig Dispense Refill  . clopidogrel (PLAVIX) 75 MG tablet Take 1 tablet (75 mg total) by mouth daily with breakfast. 90 tablet 3  . methimazole (TAPAZOLE) 5 MG tablet TAKE 1 TABLET (5 MG TOTAL) BY MOUTH EVERY  MONDAY, WEDNESDAY, AND FRIDAY. 36 tablet 2  . metoprolol succinate (TOPROL-XL) 50 MG 24 hr tablet Take 1 tablet (50 mg total) by mouth daily. Take with or immediately following a meal. 90 tablet 3  . Multiple Vitamin (MULTIVITAMIN WITH MINERALS) TABS Take 1 tablet by mouth daily. Centrum Silver    . pantoprazole (PROTONIX) 40 MG tablet Take 1 tablet (40 mg total) by mouth daily. (Patient taking differently: Take 40 mg by mouth 2 (two) times daily. ) 30 tablet 5  . simvastatin (ZOCOR) 40 MG tablet Take 1 tablet (40 mg total) by mouth at bedtime. 90 tablet 2  . Tamsulosin HCl (FLOMAX) 0.4 MG CAPS Take 0.4 mg by mouth every evening.      No current facility-administered medications on file prior to visit.     No Known Allergies  Family History  Problem Relation Age of Onset  . Breast cancer Mother   . Stroke Mother   . Transient ischemic attack Mother     BP 110/62   Pulse 86   Ht  5\' 10"  (1.778 m)   Wt 163 lb (73.9 kg)   SpO2 95%   BMI 23.39 kg/m   Review of Systems  Constitutional: Negative for fever and unexpected weight change.  HENT: Negative for hearing loss.   Eyes: Negative for visual disturbance.  Respiratory:       No change in chronic doe  Cardiovascular: Negative for chest pain.  Gastrointestinal: Negative for blood in stool.  Endocrine: Negative for cold intolerance.  Genitourinary: Negative for hematuria.  Musculoskeletal: Negative for gait problem.  Skin: Negative for rash.  Allergic/Immunologic: Negative for environmental allergies.  Neurological: Negative for syncope and numbness.  Hematological: Bruises/bleeds easily.  Psychiatric/Behavioral: Negative for dysphoric mood.      Objective:   Physical Exam VS: see vs page GEN: no distress, frail HEAD: head: no deformity eyes: no periorbital swelling, no proptosis.   external nose and ears are normal. Old healed surgical scar anterior to the left ear.   mouth: no lesion seen.   NECK: supple, thyroid is  not enlarged CHEST WALL: no deformity, except for kyphosis. Old healed surgical scar (median sternotomy)  LUNGS: clear to auscultation CV: reg rate and rhythm, no murmur ABD: abdomen is soft, nontender.  no hepatosplenomegaly.  not distended.  no hernia MUSCULOSKELETAL: muscle bulk and strength are grossly normal.  no obvious joint swelling.  gait is normal and steady EXTEMITIES: no deformity.  no ulcer on the feet.  feet are of normal color and temp.  Trace bilat leg edema.  There is bilateral onychomycosis of the toenails.  PULSES: dorsalis pedis intact bilat.  no carotid bruit NEURO:  cn 2-12 grossly intact.   readily moves all 4's.  sensation is intact to touch on the feet SKIN:  Normal texture and temperature.  No rash or suspicious lesion is visible.  Ecchymoses on the forearms.  no ulcer on the feet, but the skin is dry.  normal temp on the feet, but there is bilat patchy hyperpigmentation and cyanosis. NODES:  None palpable at the neck PSYCH: alert, well-oriented.  Does not appear anxious nor depressed.       Assessment & Plan:  Wellness visit today, with problems stable, except as noted.

## 2016-08-09 ENCOUNTER — Telehealth: Payer: Self-pay

## 2016-08-09 DIAGNOSIS — M40204 Unspecified kyphosis, thoracic region: Secondary | ICD-10-CM

## 2016-08-09 DIAGNOSIS — K449 Diaphragmatic hernia without obstruction or gangrene: Secondary | ICD-10-CM

## 2016-08-09 NOTE — Telephone Encounter (Signed)
See documentation from 07/04/16.

## 2016-08-10 ENCOUNTER — Ambulatory Visit (INDEPENDENT_AMBULATORY_CARE_PROVIDER_SITE_OTHER): Payer: Medicare Other | Admitting: Podiatry

## 2016-08-10 ENCOUNTER — Encounter: Payer: Self-pay | Admitting: Podiatry

## 2016-08-10 VITALS — BP 137/78 | HR 93 | Resp 18

## 2016-08-10 DIAGNOSIS — Q828 Other specified congenital malformations of skin: Secondary | ICD-10-CM | POA: Diagnosis not present

## 2016-08-10 DIAGNOSIS — M79675 Pain in left toe(s): Secondary | ICD-10-CM | POA: Diagnosis not present

## 2016-08-10 DIAGNOSIS — B351 Tinea unguium: Secondary | ICD-10-CM

## 2016-08-10 DIAGNOSIS — M79674 Pain in right toe(s): Secondary | ICD-10-CM

## 2016-08-10 NOTE — Patient Instructions (Signed)
Removed Band-Aid on the left great toe 1-3 days and continue to apply topical antibiotic ointment and a Band-Aid daily until a scab forms 

## 2016-08-11 NOTE — Progress Notes (Signed)
Patient ID: [name] [name], male   DOB: Aug 27, 1929, 82 y.o.   MRN: [id number]    Subjective: This patient presents today complaining of painful toenails walking wearing shoes and is requesting nail debridement. Also, patient is complaining of a painful nucleated plantar lesion on the left foot  Objective: DP and PT pulses 2/4 bilaterally Sensation to 10 g monofilament wire intact 0/5 bilaterally Vibratory sensation nonreactive bilaterally Ankle reflexes equal and reactive bilaterally Stasis pigmentation lower extremity bilaterally HAV left Manual motor testing dorsi flexion, plantar flexion 5/5 bilaterally No open skin lesions bilaterally The toenails are elongated, hypertrophic, discolored, brittle and tender direct palpation 6-10 Nucleated keratoses plantar left heel Small punctate keratoses right heel  Assessment: Peripheral neuropathy Symptomatic onychomycoses 6-10 Porokeratosis 1  Plan: Debrided toenails 10 mechanically and electrically with slight bleeding distal left hallux K with topical antibiotic ointment and Band-Aid. Patient instructed removed Band-Aid 1-3 days and continue to apply topical antibiotic ointment daily until a scab forms Debridement porokeratosis plantar left and packed with salinocaine  Reappoint 3 months

## 2016-08-16 ENCOUNTER — Telehealth: Payer: Self-pay | Admitting: Cardiovascular Disease

## 2016-08-16 ENCOUNTER — Ambulatory Visit (INDEPENDENT_AMBULATORY_CARE_PROVIDER_SITE_OTHER): Payer: Medicare Other | Admitting: Cardiovascular Disease

## 2016-08-16 VITALS — BP 150/86 | HR 75 | Ht 70.0 in | Wt 164.0 lb

## 2016-08-16 DIAGNOSIS — I5032 Chronic diastolic (congestive) heart failure: Secondary | ICD-10-CM

## 2016-08-16 DIAGNOSIS — I2510 Atherosclerotic heart disease of native coronary artery without angina pectoris: Secondary | ICD-10-CM

## 2016-08-16 DIAGNOSIS — E0580 Other thyrotoxicosis without thyrotoxic crisis or storm: Secondary | ICD-10-CM

## 2016-08-16 DIAGNOSIS — Z952 Presence of prosthetic heart valve: Secondary | ICD-10-CM

## 2016-08-16 DIAGNOSIS — Z9861 Coronary angioplasty status: Secondary | ICD-10-CM

## 2016-08-16 DIAGNOSIS — T8203XD Leakage of heart valve prosthesis, subsequent encounter: Secondary | ICD-10-CM

## 2016-08-16 DIAGNOSIS — E785 Hyperlipidemia, unspecified: Secondary | ICD-10-CM

## 2016-08-16 DIAGNOSIS — Z9181 History of falling: Secondary | ICD-10-CM

## 2016-08-16 DIAGNOSIS — Z79899 Other long term (current) drug therapy: Secondary | ICD-10-CM

## 2016-08-16 DIAGNOSIS — Z950 Presence of cardiac pacemaker: Secondary | ICD-10-CM | POA: Diagnosis not present

## 2016-08-16 DIAGNOSIS — I10 Essential (primary) hypertension: Secondary | ICD-10-CM

## 2016-08-16 DIAGNOSIS — I4819 Other persistent atrial fibrillation: Secondary | ICD-10-CM

## 2016-08-16 DIAGNOSIS — K449 Diaphragmatic hernia without obstruction or gangrene: Secondary | ICD-10-CM

## 2016-08-16 DIAGNOSIS — I481 Persistent atrial fibrillation: Secondary | ICD-10-CM

## 2016-08-16 DIAGNOSIS — T462X5A Adverse effect of other antidysrhythmic drugs, initial encounter: Secondary | ICD-10-CM

## 2016-08-16 DIAGNOSIS — Z953 Presence of xenogenic heart valve: Secondary | ICD-10-CM

## 2016-08-16 MED ORDER — POTASSIUM CHLORIDE CRYS ER 20 MEQ PO TBCR: 20.0000 meq | EXTENDED_RELEASE_TABLET | Freq: Every day | ORAL | 0 refills | Status: DC

## 2016-08-16 MED ORDER — POTASSIUM CHLORIDE CRYS ER 20 MEQ PO TBCR: 20.0000 meq | EXTENDED_RELEASE_TABLET | Freq: Every day | ORAL | 3 refills | Status: DC

## 2016-08-16 MED ORDER — FUROSEMIDE 40 MG PO TABS: 40.0000 mg | ORAL_TABLET | Freq: Every day | ORAL | 0 refills | Status: DC

## 2016-08-16 MED ORDER — FUROSEMIDE 40 MG PO TABS: 40.0000 mg | ORAL_TABLET | Freq: Every day | ORAL | 3 refills | Status: DC

## 2016-08-16 NOTE — Patient Instructions (Signed)
Medication Instructions: Dr [name] has recommended making the following medication changes: 1. START Furosemide 40 mg - take 1 tablet by mouth daily 2. START Potassium 20 mEq - take 1 tablet by mouth daily  Labwork: Your physician recommends that you return for lab work in 7-10 days.  Testing/Procedures: NONE ORDERED  Follow-up: Your physician recommends that you schedule a follow-up appointment in 2 weeks with a PA/NP.  Dr [name] recommends that you schedule a follow-up appointment in 3 months.  If you need a refill on your cardiac medications before your next appointment, please call your pharmacy.

## 2016-08-16 NOTE — Progress Notes (Signed)
Patient ID: [name] [name], male   DOB: 04/19/1929, 82 y.o.   MRN: [id number]    Cardiology Office Note    Date:  08/17/2016   ID:  [name] [name], DOB 1929-06-21, MRN [id number]  PCP:  [name] Shin, MD  Cardiologist:   [name] Klein, MD   Chief Complaint  Patient presents with  . Follow-up    pt denied chest pain    History of Present Illness:  [name] [name] is a 82 y.o. male with coronary, valvular and arrhythmic cardiac problems, here for follow-up.  He has not had any falls since his last appointment but is very careful. He notices substantial but gradual worsening of his shortness of breath. He has not had leg edema and his weight has actually decreased slightly. He reports good appetite. He becomes especially short of breath when he first lies down in bed. He has to steady his breathing and after a couple of minutes he seems to settle down. He notices dyspnea every time he becomes horizontal. He denies angina pectoris at rest or with activity.  His most recent echocardiogram from June 2017 shows normal left ventricular systolic function with ejection fraction of around 55%. There is mild perivalvular (TAVR) aortic insufficiency and the aortic valve prosthetic gradients are normal (mean grad 8 mm Hg), as are the mitral valve bioprosthetic gradients. The left atrium is severely dilated.  His amiodarone was stopped about 15 months ago since he had persistent atrial fibrillation on the drug, had hyperthyroidism and had virtually 100% ventricular pacing. Paradoxically, the burden of atrial fibrillation is actually lower off amiodarone. Over the last intervals since his last device check there has been 56% atrial fibrillation. They he presents with atrial paced rhythm. There appears to be no correlation between his breathing and the presence or absence of atrial fibrillation.. He has not had any problems with high ventricular rates. He remains completely oblivious to the arrhythmia. He has 80%  ventricular pacing over the last 6 months. 21% atrial pacing. Heart rate histogram is appropriate. His device is programmed DDI in an effort to avoid tracking of occasional rapid atrial tachycardia. Other device parameters are all normal on today's check. Estimated generator longevity is 22 months.   His cardiac problems date back to 2009 when he was diagnosed with severe mitral insufficiency related to mitral valve prolapse. He was also having problems with paroxysmal atrial fibrillation. Coronary angiography demonstrated moderate coronary artery disease primarily a 70% calcified eccentric proximal LAD lesion with moderate stenoses in the other 2 major coronary arteries. He was referred for robotic mitral valve repair by Dr. Amador [name] at [facility]. The LIMA was found to be a very small vessel. He received a saphenous vein graft bypass to the LAD. Cryoablation was performed for atrial fibrillation. The mitral valve was replaced with a 29 mm St. Jude Biocor prosthesis. The postoperative course was complicated by an embolic stroke from which she has recovered without sequelae. He received a dual-chamber permanent pacemaker (Medtronic adapta) and was pacemaker dependent with 100% pacing in both the atrium and the ventricle, though he has subsequently regained AV conduction at least partly. Subsequently, he has developed progressive CAD and aortic valve disease and underwent rotational atherectomy and stenting of the proximal LAD on 03/24/2014 (3 x 8 mm Xience Alpine drug-eluting stent) and then TAVR (29 mm Edwards Sapien) on April 22, 2014.  Left ventricular systolic function was mildly depressed with an EF of 45-50% primarily due to RV pacing induced asynchrony, but  follow-up echo in May 2016 suggested EF dropped to 35-40 percent. The most recent echocardiogram in November 2016 shows normal left ventricular ejection fraction of 60%. This last echo showed that the aortic valve insufficiency was only trivial. He has a very  large diaphragmatic hernia with gastric and colonic contents in the mid thorax. This prevents imaging of his heart from transesophageal views  He has mild hyperthyroidism that is well controlled on low dose of methimazole. 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 He has gastroesophageal reflux disease and Barrett's esophagus. He has extensive sigmoid and transverse colonic diverticulosis. One month following aortic stent valve replacement he had diverticular bleeding requiring angiographic coil embolization.   Past Medical History:  Diagnosis Date  . ANEMIA, IRON DEFICIENCY 10/13/2008  . ANXIETY 09/04/2008  . Aortic valvular disorder 08/16/2012   echo EF 45-50% LV mildly reduce,Biocor mitral valve,mild to mod. tricuspid regrug.,mild to mod. aortic regrug.  . Aortic valvular stenosis 11/19/2013  . ATRIAL FIBRILLATION, CHRONIC 01/14/2008   on warfarin since 2007  . BARRETTS ESOPHAGUS 03/20/2009  . BASAL CELL CARCINOMA OF SKIN SITE UNSPECIFIED 09/28/2010  . Cardiomyopathy, ischemic 11/19/2013   EF 45-50% with inferior wall hypokinesis by echo 2014  . CATARACTS, BILATERAL, HX OF 01/14/2008  . CEREBROVASCULAR ACCIDENT, HX OF 08/18/2008  . COAGULOPATHY, COUMADIN-INDUCED 01/14/2008  . COLONIC POLYPS, ADENOMATOUS 01/14/2008  . CORONARY ARTERY DISEASE 06/01/2007   Cath 04/03/2008; Cath 11/01/1990  . Dysrhythmia    HX OF [place]Y BRADY  . Edema, peripheral 07/30/2009   LEV doppler- no thrombus or thrombophlebitis  . Elevated PSA   . GASTRIC POLYP 05/13/2009  . GERD 06/01/2007  . Hyperglycemia   . HYPERLIPIDEMIA 06/01/2007  . HYPERTENSION 06/01/2007  . HYPERTHYROIDISM 01/06/2011  . Mitral regurgitation 06/13/2008   mitral valve replacement #20 St. Jude Biocor; Maze procedure by Dr. Amador [name]  at [facility]  . Mitral stenosis    Porcine bioprosthetic tissue valve placed 06/13/2008  . Mitral valve disorder 09/08/2011   echo EF 50-55% LV normal  . Pacemaker   . Peripheral artery disease ([place]) 06/16/2010   LEA doppler-left  ABI nml 0.61 calcified vessels;right ABI  0.68  . PERSONAL HX COLONIC POLYPS 01/01/2010  . PLEURAL EFFUSION, LEFT 10/13/2008  . PROSTATE CANCER, HX OF 01/14/2008  . Prosthetic valve dysfunction    Mitral stenosis involving bioprosthetic tissue valve placed 06/13/2008  . Restless leg syndrome   . S/P CABG x 1 06/13/2008   SVG to LAD with open vein harvest right thigh, LIMA harvested but not utilized by Dr [name] [name]  . S/P Maze operation for atrial fibrillation 06/13/2008   Partial left atrial lesion set using cryothermy for bilateral pulmonary vein isolation by Dr [name] [name]  . S/P mitral valve replacement with bioprosthetic valve 06/13/2008   3mm St Jude Biocor porcine bioprosthetic tissue valve by Dr [name] [name]  . 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 Tachy-brady syndrome ([place]) 07/13/2008   medtronic Adapta gen. model ADDR01 ,serial # [id number] H by Dr  [name] at [facility]  . UNSPECIFIED VENOUS INSUFFICIENCY 09/18/2008    Past Surgical History:  Procedure Laterality Date  . CARDIAC CATHETERIZATION  11/01/1990  . CARDIOVASCULAR STRESS TEST  06/03/2010   Persantine perfusion EF 45% mild to moderate ischemia basal and mid inferolateral region  . CARDIOVASCULAR STRESS TEST  01/01/2008   left ventricle normal,no significant ischemia  . CARDIOVASCULAR STRESS TEST  09/09/2005  possibility of ischemia inferior wall  . COLONOSCOPY  07/14/2009   diverticulosis, internal hemorrhoids  . CORONARY ARTERY BYPASS GRAFT  06/13/2008   graft x1 w/vein graft LAD artery by Dr. Amador [name] at [facility]  . CORONARY STENT PLACEMENT  03/24/2014   DES to LAD  . CYSTECTOMY     Left leg  . INTRAOPERATIVE TRANSESOPHAGEAL ECHOCARDIOGRAM N/A 04/22/2014   Procedure: INTRAOPERATIVE TRANSTHORACIC ECHOCARDIOGRAM;  Surgeon: [name], MD;  Location: [place];  Service: Open Heart Surgery;  Laterality: N/A;  . LEFT  AND RIGHT HEART CATHETERIZATION WITH CORONARY ANGIOGRAM N/A 01/08/2014   Procedure: LEFT AND RIGHT HEART CATHETERIZATION WITH CORONARY ANGIOGRAM;  Surgeon: [name] Klein, MD;  Location: [place] CATH LAB;  Service: Cardiovascular;  Laterality: N/A;  . MITRAL VALVE REPLACEMENT  06/13/2008   #20 St. Jude Bicor mitral valve ;Maze procedure at [facility] by Dr. Amador [name]  . PACEMAKER PLACEMENT  07/13/2008   [place] dual chamber-Medtronic at [facility]   . PERCUTANEOUS CORONARY ROTOBLATOR INTERVENTION (PCI-R) N/A 03/24/2014   Procedure: PERCUTANEOUS CORONARY ROTOBLATOR INTERVENTION (PCI-R);  Surgeon: Blane Ohara, MD;  Location: [facility] CATH LAB;  Service: Cardiovascular;  Laterality: N/A;  . PROSTATECTOMY    . SKIN CANCER EXCISION     removed from forehead and right leg, nose/face  . TEE WITHOUT CARDIOVERSION N/A 01/22/2014   Procedure: TRANSESOPHAGEAL ECHOCARDIOGRAM (TEE);  Surgeon: [name] Klein, MD;  Location: [facility] ENDOSCOPY;  Service: Cardiovascular;  Laterality: N/A;  . TONSILLECTOMY    . TRANSCATHETER AORTIC VALVE REPLACEMENT, TRANSFEMORAL N/A 04/22/2014   Procedure: TRANSCATHETER AORTIC VALVE REPLACEMENT, TRANSFEMORAL;  Surgeon: [name], MD;  Location: [place];  Service: Open Heart Surgery;  Laterality: N/A;  TAVR-TF (RIGHT SIDE)  . UPPER GASTROINTESTINAL ENDOSCOPY  07/14/2009   erosive reflux esopagitis, Barrett's esophagus    Current Medications: Outpatient Medications Prior to Visit  Medication Sig Dispense Refill  . clopidogrel (PLAVIX) 75 MG tablet Take 1 tablet (75 mg total) by mouth daily with breakfast. 90 tablet 3  . methimazole (TAPAZOLE) 5 MG tablet TAKE 1 TABLET (5 MG TOTAL) BY MOUTH EVERY MONDAY, WEDNESDAY, AND FRIDAY. 36 tablet 2  . metoprolol succinate (TOPROL-XL) 50 MG 24 hr tablet Take 1 tablet (50 mg total) by mouth daily. Take with or immediately following a meal. 90 tablet 3  . Multiple Vitamin (MULTIVITAMIN WITH MINERALS) TABS Take 1 tablet by mouth daily. Centrum Silver    . pantoprazole  (PROTONIX) 40 MG tablet Take 1 tablet (40 mg total) by mouth daily. (Patient taking differently: Take 40 mg by mouth 2 (two) times daily. ) 30 tablet 5  . simvastatin (ZOCOR) 40 MG tablet Take 1 tablet (40 mg total) by mouth at bedtime. 90 tablet 2  . Tamsulosin HCl (FLOMAX) 0.4 MG CAPS Take 0.4 mg by mouth every evening.      No facility-administered medications prior to visit.      Allergies:   Review of patient's allergies indicates no known allergies.   Social History   Social History  . Marital status: Married    Spouse name: N/A  . Number of children: 4  . Years of education: N/A   Occupational History  . Retired Retired   Social History Main Topics  . Smoking status: Former Smoker    Years: 2.00    Types: Pipe    Quit date: 11/07/1965  . Smokeless tobacco: Never Used     Comment: quit over 40 years ago, never smoked cigarettes  . Alcohol use No  . Drug use: No  .  Sexual activity: Not on file   Other Topics Concern  . Not on file   Social History Narrative  . No narrative on file     Family History:  The patient's family history includes Breast cancer in his mother; Stroke in his mother; Transient ischemic attack in his mother.   ROS:   Please see the history of present illness.    ROS All other systems reviewed and are negative.   PHYSICAL EXAM:   VS:  BP (!) 150/86   Pulse 75   Ht 5\' 10"  (1.778 m)   Wt 164 lb (74.4 kg)   BMI 23.53 kg/m    GEN: Well nourished, well developed, in no acute distress, Slightly tachypnea (respiratory rate 22) HEENT: normal  Neck: no carotid bruits, or masses. No venous pulsations are markedly elevated at about 8-9 centimeters and there are prominent V waves Cardiac: Paradoxically split S2,R;  grade 1-2/6 aortic ejection murmur is early peaking, there is a 2/6 crescendo diastolic murmur heard especially well at the left lower sternal border, no rubs, or gallops,no edema ; Healthy subclavian pacemaker site  Respiratory:  clear to  auscultation bilaterally, normal work of breathing. Very prominent thoracic kyphosis GI: soft, nontender, nondistended, + BS MS: no deformity or atrophy  Skin: warm and dry, no rash Neuro:  Alert and Oriented x 3, Strength and sensation are intact Psych: euthymic mood, full affect  Wt Readings from Last 3 Encounters:  08/16/16 164 lb (74.4 kg)  08/04/16 163 lb (73.9 kg)  03/28/16 169 lb (76.7 kg)      Studies/Labs Reviewed:   EKG:  EKG is not ordered today.   Recent Labs: 05/08/2016: BUN 15; Creatinine, Ser 0.75; Potassium 4.1; Sodium 142 08/04/2016: Hemoglobin 12.6; Platelets 149.0; TSH 1.52   Lipid Panel    Component Value Date/Time   CHOL 120 03/14/2016 1605   TRIG 48.0 03/14/2016 1605   HDL 42.60 03/14/2016 1605   CHOLHDL 3 03/14/2016 1605   VLDL 9.6 03/14/2016 1605   LDLCALC 68 03/14/2016 1605      ASSESSMENT:    1. Chronic diastolic heart failure (HCC)   2. Persistent atrial fibrillation ([place])   3. Pacemaker - Medtronic Adapta, dual chamber   4. S/P mitral valve replacement with bioprosthetic valve   5. S/P TAVR (transcatheter aortic valve replacement)   6. Perivalvular leak of prosthetic heart valve, subsequent encounter   7. CAD S/P percutaneous coronary angioplasty - Xience DES 3.0 mm x 8 mm prox LAD   8. Essential hypertension   9. Diaphragmatic hernia without obstruction and without gangrene   10. Dyslipidemia   11. 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 Risk for falls   13. Medication management      PLAN:  In order of problems listed above:  1. Chronic diastolic heart failure -  Despite a weight that is lower than what we had previously estimated as his "dry weight", he has evidence of hypervolemia on physical exam. We'll start a daily diuretic and potassium supplement. Bring him back to see [place] in a couple of weeks, with labs. I'm sure his large hiatal hernia is also contributing to supine dyspnea, but is neck vein elevation is obvious even when he is  sitting fully upright.  2. Recurrent persistent atrial fibrillation  He is not on anticoagulation due to GI bleeding complications. There is no clear evidence that the atrial fibrillation is having significant impact on his symptoms. Amiodarone stopped due to thyrotoxicosis. No plan for  other antiarrhythmic therapy. Rate control is excellent. In fact he has a rather high burden of ventricular pacing.  3. Normal function of dual-chamber permanent pacemaker, programmed DDI in attempts to minimize ventricular pacing while avoiding tracking frequent episodes of brief paroxysmal atrial tachycardia. The overall burden of ventricular pacing has remained around 80%. Blood pressure is relatively high and I would not want to stop his metoprolol. Continue remote checks every 3 months  4. Status post mitral valve biological prosthesis with normal function  5. Status post TAVR with normal gradients  6. Mild valvular aortic insufficiency by most recent echocardiogram, not clinically important at this time. Murmur is quite obvious, but not really changed from previous exam..  7. CAD status post drug-eluting stent to proximal LAD artery June 2016. Plan to continue the clopidogrel beyond indefinitely, unless bleeding becomes a more serious problem  8. Very large diaphragmatic hernia with gastric and colon contents in the thoracic cavity. Together with his prominent thoracic kyphosis I believe these are contributing in large measure to his dyspnea. I think the diaphragmatic hernia he comes in especially significant liability when he lies down. It is hard to distinguish orthopnea from the hernia from heart failure. I believe both are operating right now. I offered him a hospital bed so that he can elevate the head of the bed, but he declined. He states that once the initial dyspnea resolves he sleeps excellent in his own bed.  9. Hypertension, mildly elevated today, usually well controlled   10. Hyperlipidemia with  excellent LDL cholesterol on statin  11. Hyperthyroidism, possibly amiodarone related, has been euvolemic for a year now. Latest labs from Sept 28 showed normal TSH. He is still taking methimazole 3 days a week.  12. Falls: Couple of serious falls earlier this year, thankfully none recently.   Medication Adjustments/Labs and Tests Ordered: Current medicines are reviewed at length with the patient today.  Concerns regarding medicines are outlined above.  Medication changes, Labs and Tests ordered today are listed in the Patient Instructions below. Patient Instructions  Medication Instructions: Dr [name] has recommended making the following medication changes: 1. START Furosemide 40 mg - take 1 tablet by mouth daily 2. START Potassium 20 mEq - take 1 tablet by mouth daily  Labwork: Your physician recommends that you return for lab work in 7-10 days.  Testing/Procedures: NONE ORDERED  Follow-up: Your physician recommends that you schedule a follow-up appointment in 2 weeks with a PA/NP.  Dr [name] recommends that you schedule a follow-up appointment in 3 months.  If you need a refill on your cardiac medications before your next appointment, please call your pharmacy.    Signed, [name] Klein, MD  08/17/2016 1:31 PM    [facility] [place], [place], [place]  [address] Phone: [phone]; Fax: [phone]

## 2016-08-16 NOTE — Telephone Encounter (Signed)
Mr. [name] [name] is calling back to confirm that the prescription that he already had is what Dr. [name] prescribe for [employer] , which is the [place]- Con M20  Meq tablet . Thanks

## 2016-08-16 NOTE — Telephone Encounter (Signed)
Lasix & potassium recently re-prescribed.  Returned call. Noted pt needs Rx sent to local pharmacy. Requested this to be sent to [facility] on [address]. 14 day supply sent while he waits for mail order supply to arrive.

## 2016-08-17 ENCOUNTER — Encounter: Payer: Self-pay | Admitting: Cardiovascular Disease

## 2016-08-24 LAB — BASIC METABOLIC PANEL
BUN: 25 mg/dL (ref 7–25)
CO2: 31 mmol/L (ref 20–31)
Calcium: 8.9 mg/dL (ref 8.6–10.3)
Chloride: 102 mmol/L (ref 98–110)
Creat: 0.93 mg/dL (ref 0.70–1.11)
GLUCOSE: 89 mg/dL (ref 65–99)
POTASSIUM: 4.3 mmol/L (ref 3.5–5.3)
Sodium: 144 mmol/L (ref 135–146)

## 2016-08-24 LAB — BRAIN NATRIURETIC PEPTIDE: BRAIN NATRIURETIC PEPTIDE: 237.5 pg/mL — AB (ref <100)

## 2016-08-30 ENCOUNTER — Telehealth: Payer: Self-pay | Admitting: Endocrinology

## 2016-08-31 ENCOUNTER — Ambulatory Visit: Payer: Medicare Other | Admitting: Cardiology

## 2016-09-01 ENCOUNTER — Ambulatory Visit (INDEPENDENT_AMBULATORY_CARE_PROVIDER_SITE_OTHER): Payer: Medicare Other | Admitting: Cardiology

## 2016-09-01 ENCOUNTER — Encounter: Payer: Self-pay | Admitting: Cardiology

## 2016-09-01 DIAGNOSIS — I2510 Atherosclerotic heart disease of native coronary artery without angina pectoris: Secondary | ICD-10-CM

## 2016-09-01 DIAGNOSIS — Z953 Presence of xenogenic heart valve: Secondary | ICD-10-CM

## 2016-09-01 DIAGNOSIS — T462X5A Adverse effect of other antidysrhythmic drugs, initial encounter: Secondary | ICD-10-CM

## 2016-09-01 DIAGNOSIS — Z951 Presence of aortocoronary bypass graft: Secondary | ICD-10-CM

## 2016-09-01 DIAGNOSIS — I5032 Chronic diastolic (congestive) heart failure: Secondary | ICD-10-CM

## 2016-09-01 DIAGNOSIS — Z9861 Coronary angioplasty status: Secondary | ICD-10-CM

## 2016-09-01 DIAGNOSIS — I5033 Acute on chronic diastolic (congestive) heart failure: Secondary | ICD-10-CM | POA: Diagnosis not present

## 2016-09-01 DIAGNOSIS — Z952 Presence of prosthetic heart valve: Secondary | ICD-10-CM

## 2016-09-01 DIAGNOSIS — E0580 Other thyrotoxicosis without thyrotoxic crisis or storm: Secondary | ICD-10-CM

## 2016-09-01 DIAGNOSIS — I481 Persistent atrial fibrillation: Secondary | ICD-10-CM

## 2016-09-01 DIAGNOSIS — I10 Essential (primary) hypertension: Secondary | ICD-10-CM

## 2016-09-01 DIAGNOSIS — I4819 Other persistent atrial fibrillation: Secondary | ICD-10-CM

## 2016-09-01 DIAGNOSIS — I4821 Permanent atrial fibrillation: Secondary | ICD-10-CM | POA: Insufficient documentation

## 2016-09-01 DIAGNOSIS — Z950 Presence of cardiac pacemaker: Secondary | ICD-10-CM

## 2016-09-01 DIAGNOSIS — Z9181 History of falling: Secondary | ICD-10-CM

## 2016-09-01 DIAGNOSIS — K449 Diaphragmatic hernia without obstruction or gangrene: Secondary | ICD-10-CM

## 2016-09-01 NOTE — Assessment & Plan Note (Signed)
EF 55% by echo June 2017

## 2016-09-01 NOTE — Assessment & Plan Note (Signed)
SVG to LAD with open vein harvest via right thigh, LIMA harvested but not utilized by Dr Amador Cunas 2009

## 2016-09-01 NOTE — Assessment & Plan Note (Signed)
Last TSH 1.52 Sept 2017

## 2016-09-01 NOTE — Assessment & Plan Note (Signed)
S/P TAVR June 2015

## 2016-09-01 NOTE — Assessment & Plan Note (Signed)
Seen today in f/u after addition of lasix 08/16/16

## 2016-09-01 NOTE — Assessment & Plan Note (Signed)
Hx of CABG when he had MVR 2009

## 2016-09-01 NOTE — Patient Instructions (Signed)
Medication Instructions:  Your physician recommends that you continue on your current medications as directed. Please refer to the Current Medication list given to you today.  Labwork: None   Testing/Procedures: None   Follow-Up: Your physician recommends that you schedule a follow-up appointment in: Keep up appointment with Dr [name] in January 2018.  Any Other Special Instructions Will Be Listed Below (If Applicable).     If you need a refill on your cardiac medications before your next appointment, please call your pharmacy.

## 2016-09-01 NOTE — Assessment & Plan Note (Signed)
Xience DES 3.0 mm x 8 mm prox LAD June 2016

## 2016-09-01 NOTE — Progress Notes (Signed)
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 The pt had MVR with CABG x 1 at [place] in 2009. He had an LAD DES in May 2015 followed by TAVR in June 2015. He has persistent atrial fibrillation and SSS. He is s/p MDT pacemaker and is pacer dependant. He is not anticoagulated secondary to a history of GI bleeding. He had Amiodarone induced hyperthyroidism. He has had some problems with diastolic CHF. His last EF was 55% by echo June 2017. He saw Dr [name] in the office 08/16/16 and was placed on daily Lasix and K+ for suspected volume overload. He is here today for follow up. His wgt is unchanged but he says he feels better. The believes, as does Dr [name], that the pt's diaphragmatic hernia and kyphosis may be contributing to his dyspnea. A f/u BMP done 08/23/16 looked good. 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    Current Outpatient Prescriptions  Medication Sig Dispense Refill  . clopidogrel (PLAVIX) 75 MG tablet Take 1 tablet (75 mg total) by mouth daily with breakfast. 90 tablet 3  . methimazole (TAPAZOLE) 5 MG tablet TAKE 1 TABLET (5 MG TOTAL) BY MOUTH EVERY MONDAY, WEDNESDAY, AND FRIDAY. 36 tablet 2  . metoprolol succinate (TOPROL-XL) 50 MG 24 hr tablet Take 1 tablet (50 mg total) by mouth daily. Take with or immediately following a meal. 90 tablet 3  . Multiple Vitamin (MULTIVITAMIN WITH MINERALS) TABS Take 1 tablet by mouth daily. Centrum Silver    . pantoprazole (PROTONIX) 40 MG tablet Take 1 tablet (40 mg total) by mouth daily. (Patient taking differently: Take 40 mg by mouth 2 (two) times daily. ) 30 tablet 5  . simvastatin (ZOCOR) 40 MG tablet Take 1 tablet (40 mg total) by mouth at bedtime. 90  tablet 2  . Tamsulosin HCl (FLOMAX) 0.4 MG CAPS Take 0.4 mg by mouth every evening.     . furosemide (LASIX) 40 MG tablet Take 1 tablet (40 mg total) by mouth daily. 14 tablet 0  . potassium chloride SA (KLOR-CON M20) 20 MEQ tablet Take 1 tablet (20 mEq total) by mouth daily. 14 tablet 0   No current facility-administered medications for this visit.     No Known Allergies  Social History   Social History  . Marital status: Married    Spouse name: N/A  . Number of children: 4  . Years of education: N/A   Occupational History  . Retired Retired   Social History Main Topics  . Smoking status: Former Smoker    Years: 2.00    Types: Pipe    Quit date: 11/07/1965  . Smokeless tobacco: Never Used     Comment: quit over 40 years ago, never smoked cigarettes  . Alcohol use No  . Drug use: No  . Sexual activity: Not on file   Other Topics Concern  . Not on file   Social History Narrative  . No narrative on file     Review of Systems: General: negative for chills, fever, night sweats or weight changes.  Cardiovascular: negative for chest pain, dyspnea on exertion, edema, orthopnea, palpitations, paroxysmal nocturnal dyspnea or shortness of breath Dermatological: negative for  rash Respiratory: negative for cough or wheezing Urologic: negative for hematuria Abdominal: negative for nausea, vomiting, diarrhea, bright red blood per rectum, melena, or hematemesis Neurologic: negative for visual changes, syncope, or dizziness All other systems reviewed and are otherwise negative except as noted above.    Blood pressure 132/84, pulse 91, height 5\' 10"  (1.778 m), weight 164 lb (74.4 kg).  General appearance: alert, cooperative and no distress Neck: no JVD Lungs: clear to auscultation bilaterally and kyphosis Heart: regular rate and rhythm Extremities: no edema Skin: Skin color, texture, turgor normal. No rashes or lesions Neurologic: Grossly normal   ASSESSMENT AND PLAN:    Acute on chronic diastolic heart failure (HCC) Seen today in f/u after addition of lasix [id number]  Chronic diastolic heart failure (HCC) EF 55% by echo June 2017  S/P TAVR (transcatheter aortic valve replacement) S/P TAVR June 2015  S/P mitral valve replacement with bioprosthetic valve 28mm St Jude Biocor porcine bioprosthetic tissue valve by Dr Amador Cunas at [facility]- 2009  S/P CABG x 1 SVG to LAD with open vein harvest via right thigh, LIMA harvested but not utilized by Dr Amador Cunas 2009  CAD S/P percutaneous coronary angioplasty -  Xience DES 3.0 mm x 8 mm prox LAD June 2016  Diaphragmatic hernia Large diaphragmatic hernia  Hx of CABG Hx of CABG when he had MVR 2009  Essential hypertension Controlled  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 Pacemaker dependent , 100% pacing in the atrium and ventricle  Persistent atrial fibrillation ([place]) Not on Coumadin secondary to GI bleeding   PLAN  I suggested Mr Lumbard continue with the Lasix as ordered. I told him it would be OK to hold now and then if he needs to. F/U with Dr [name] in jan 2018 as scheduled.   [name] PA-C 09/01/2016 3:29 PM

## 2016-09-01 NOTE — Assessment & Plan Note (Signed)
Controlled.  

## 2016-09-01 NOTE — Assessment & Plan Note (Signed)
Not on Coumadin secondary to GI bleeding

## 2016-09-01 NOTE — Assessment & Plan Note (Signed)
Implanted 2009. Pacemaker dependent , 100% pacing in the atrium and ventricle

## 2016-09-01 NOTE — Assessment & Plan Note (Signed)
No falls since LOV

## 2016-09-01 NOTE — Assessment & Plan Note (Signed)
Large diaphragmatic hernia

## 2016-09-01 NOTE — Assessment & Plan Note (Signed)
13mm St Jude Biocor porcine bioprosthetic tissue valve by Dr Amador Cunas at [facility]- 2009

## 2016-09-21 ENCOUNTER — Encounter (HOSPITAL_BASED_OUTPATIENT_CLINIC_OR_DEPARTMENT_OTHER): Payer: Self-pay | Admitting: Emergency Medicine

## 2016-09-21 ENCOUNTER — Inpatient Hospital Stay (HOSPITAL_BASED_OUTPATIENT_CLINIC_OR_DEPARTMENT_OTHER)
Admission: EM | Admit: 2016-09-21 | Discharge: 2016-09-24 | DRG: 293 | Disposition: A | Discharge status: Home Health | Payer: Medicare Other | Attending: Cardiovascular Disease | Admitting: Cardiovascular Disease

## 2016-09-21 ENCOUNTER — Emergency Department (HOSPITAL_BASED_OUTPATIENT_CLINIC_OR_DEPARTMENT_OTHER): Payer: Medicare Other

## 2016-09-21 DIAGNOSIS — K219 Gastro-esophageal reflux disease without esophagitis: Secondary | ICD-10-CM | POA: Diagnosis present

## 2016-09-21 DIAGNOSIS — R911 Solitary pulmonary nodule: Secondary | ICD-10-CM | POA: Diagnosis present

## 2016-09-21 DIAGNOSIS — F419 Anxiety disorder, unspecified: Secondary | ICD-10-CM | POA: Diagnosis present

## 2016-09-21 DIAGNOSIS — Z87891 Personal history of nicotine dependence: Secondary | ICD-10-CM | POA: Diagnosis not present

## 2016-09-21 DIAGNOSIS — I495 Sick sinus syndrome: Secondary | ICD-10-CM | POA: Diagnosis not present

## 2016-09-21 DIAGNOSIS — J984 Other disorders of lung: Secondary | ICD-10-CM | POA: Diagnosis not present

## 2016-09-21 DIAGNOSIS — I255 Ischemic cardiomyopathy: Secondary | ICD-10-CM | POA: Diagnosis present

## 2016-09-21 DIAGNOSIS — E0580 Other thyrotoxicosis without thyrotoxic crisis or storm: Secondary | ICD-10-CM | POA: Diagnosis present

## 2016-09-21 DIAGNOSIS — T462X5A Adverse effect of other antidysrhythmic drugs, initial encounter: Secondary | ICD-10-CM | POA: Diagnosis present

## 2016-09-21 DIAGNOSIS — I482 Chronic atrial fibrillation: Secondary | ICD-10-CM | POA: Diagnosis present

## 2016-09-21 DIAGNOSIS — Z823 Family history of stroke: Secondary | ICD-10-CM

## 2016-09-21 DIAGNOSIS — I739 Peripheral vascular disease, unspecified: Secondary | ICD-10-CM | POA: Diagnosis present

## 2016-09-21 DIAGNOSIS — Z7902 Long term (current) use of antithrombotics/antiplatelets: Secondary | ICD-10-CM

## 2016-09-21 DIAGNOSIS — K449 Diaphragmatic hernia without obstruction or gangrene: Secondary | ICD-10-CM | POA: Diagnosis present

## 2016-09-21 DIAGNOSIS — I2510 Atherosclerotic heart disease of native coronary artery without angina pectoris: Secondary | ICD-10-CM | POA: Diagnosis present

## 2016-09-21 DIAGNOSIS — E785 Hyperlipidemia, unspecified: Secondary | ICD-10-CM | POA: Diagnosis present

## 2016-09-21 DIAGNOSIS — L89151 Pressure ulcer of sacral region, stage 1: Secondary | ICD-10-CM | POA: Diagnosis present

## 2016-09-21 DIAGNOSIS — Z8673 Personal history of transient ischemic attack (TIA), and cerebral infarction without residual deficits: Secondary | ICD-10-CM

## 2016-09-21 DIAGNOSIS — I110 Hypertensive heart disease with heart failure: Secondary | ICD-10-CM | POA: Diagnosis present

## 2016-09-21 DIAGNOSIS — D509 Iron deficiency anemia, unspecified: Secondary | ICD-10-CM | POA: Diagnosis present

## 2016-09-21 DIAGNOSIS — Z951 Presence of aortocoronary bypass graft: Secondary | ICD-10-CM

## 2016-09-21 DIAGNOSIS — Z803 Family history of malignant neoplasm of breast: Secondary | ICD-10-CM | POA: Diagnosis not present

## 2016-09-21 DIAGNOSIS — I272 Pulmonary hypertension, unspecified: Secondary | ICD-10-CM | POA: Diagnosis present

## 2016-09-21 DIAGNOSIS — R0602 Shortness of breath: Secondary | ICD-10-CM

## 2016-09-21 DIAGNOSIS — Z953 Presence of xenogenic heart valve: Secondary | ICD-10-CM

## 2016-09-21 DIAGNOSIS — Z85828 Personal history of other malignant neoplasm of skin: Secondary | ICD-10-CM

## 2016-09-21 DIAGNOSIS — G2581 Restless legs syndrome: Secondary | ICD-10-CM | POA: Diagnosis present

## 2016-09-21 DIAGNOSIS — Z8546 Personal history of malignant neoplasm of prostate: Secondary | ICD-10-CM

## 2016-09-21 DIAGNOSIS — Z79899 Other long term (current) drug therapy: Secondary | ICD-10-CM | POA: Diagnosis not present

## 2016-09-21 DIAGNOSIS — I5033 Acute on chronic diastolic (congestive) heart failure: Secondary | ICD-10-CM | POA: Diagnosis present

## 2016-09-21 DIAGNOSIS — I481 Persistent atrial fibrillation: Secondary | ICD-10-CM | POA: Diagnosis not present

## 2016-09-21 DIAGNOSIS — T8203XD Leakage of heart valve prosthesis, subsequent encounter: Secondary | ICD-10-CM

## 2016-09-21 DIAGNOSIS — T501X6A Underdosing of loop [high-ceiling] diuretics, initial encounter: Secondary | ICD-10-CM | POA: Diagnosis present

## 2016-09-21 DIAGNOSIS — Z86010 Personal history of colonic polyps: Secondary | ICD-10-CM | POA: Diagnosis not present

## 2016-09-21 DIAGNOSIS — Z952 Presence of prosthetic heart valve: Secondary | ICD-10-CM | POA: Diagnosis not present

## 2016-09-21 DIAGNOSIS — Z950 Presence of cardiac pacemaker: Secondary | ICD-10-CM | POA: Diagnosis not present

## 2016-09-21 DIAGNOSIS — Z91128 Patient's intentional underdosing of medication regimen for other reason: Secondary | ICD-10-CM

## 2016-09-21 DIAGNOSIS — I4821 Permanent atrial fibrillation: Secondary | ICD-10-CM | POA: Diagnosis present

## 2016-09-21 DIAGNOSIS — I25810 Atherosclerosis of coronary artery bypass graft(s) without angina pectoris: Secondary | ICD-10-CM

## 2016-09-21 LAB — CBC WITH DIFFERENTIAL/PLATELET
Basophils Absolute: 0 10*3/uL (ref 0.0–0.1)
Basophils Relative: 0 %
EOS ABS: 0.2 10*3/uL (ref 0.0–0.7)
EOS PCT: 2 %
HCT: 37 % — ABNORMAL LOW (ref 39.0–52.0)
Hemoglobin: 11.9 g/dL — ABNORMAL LOW (ref 13.0–17.0)
LYMPHS ABS: 0.9 10*3/uL (ref 0.7–4.0)
Lymphocytes Relative: 12 %
MCH: 30.5 pg (ref 26.0–34.0)
MCHC: 32.2 g/dL (ref 30.0–36.0)
MCV: 94.9 fL (ref 78.0–100.0)
MONOS PCT: 8 %
Monocytes Absolute: 0.6 10*3/uL (ref 0.1–1.0)
Neutro Abs: 5.6 10*3/uL (ref 1.7–7.7)
Neutrophils Relative %: 78 %
PLATELETS: 135 10*3/uL — AB (ref 150–400)
RBC: 3.9 MIL/uL — ABNORMAL LOW (ref 4.22–5.81)
RDW: 14.7 % (ref 11.5–15.5)
WBC: 7.3 10*3/uL (ref 4.0–10.5)

## 2016-09-21 LAB — BASIC METABOLIC PANEL
Anion gap: 5 (ref 5–15)
BUN: 23 mg/dL — AB (ref 6–20)
CALCIUM: 8.8 mg/dL — AB (ref 8.9–10.3)
CO2: 29 mmol/L (ref 22–32)
CREATININE: 0.76 mg/dL (ref 0.61–1.24)
Chloride: 110 mmol/L (ref 101–111)
GFR calc Af Amer: >60 mL/min (ref >60)
Glucose, Bld: 125 mg/dL — ABNORMAL HIGH (ref 65–99)
Potassium: 3.7 mmol/L (ref 3.5–5.1)
SODIUM: 144 mmol/L (ref 135–145)

## 2016-09-21 LAB — CBC
HCT: 39.4 % (ref 39.0–52.0)
Hemoglobin: 12.5 g/dL — ABNORMAL LOW (ref 13.0–17.0)
MCH: 29.4 pg (ref 26.0–34.0)
MCHC: 31.7 g/dL (ref 30.0–36.0)
MCV: 92.7 fL (ref 78.0–100.0)
Platelets: 147 10*3/uL — ABNORMAL LOW (ref 150–400)
RBC: 4.25 MIL/uL (ref 4.22–5.81)
RDW: 14.6 % (ref 11.5–15.5)
WBC: 7.3 10*3/uL (ref 4.0–10.5)

## 2016-09-21 LAB — MRSA PCR SCREENING: MRSA by PCR: NEGATIVE

## 2016-09-21 LAB — TROPONIN I

## 2016-09-21 LAB — CREATININE, SERUM
CREATININE: 0.84 mg/dL (ref 0.61–1.24)
GFR calc Af Amer: >60 mL/min (ref >60)

## 2016-09-21 LAB — BRAIN NATRIURETIC PEPTIDE: B Natriuretic Peptide: 481.6 pg/mL — ABNORMAL HIGH (ref 0.0–100.0)

## 2016-09-21 MED ORDER — PANTOPRAZOLE SODIUM 40 MG PO TBEC
40.0000 mg | DELAYED_RELEASE_TABLET | Freq: Every day | ORAL | Status: DC
  Administered 2016-09-22 – 2016-09-24 (×3): 40 mg via ORAL
  Filled 2016-09-21 (×3): qty 1

## 2016-09-21 MED ORDER — CLOPIDOGREL BISULFATE 75 MG PO TABS
75.0000 mg | ORAL_TABLET | Freq: Every day | ORAL | Status: DC
  Administered 2016-09-22 – 2016-09-24 (×3): 75 mg via ORAL
  Filled 2016-09-21 (×3): qty 1

## 2016-09-21 MED ORDER — FUROSEMIDE 10 MG/ML IJ SOLN
40.0000 mg | Freq: Once | INTRAMUSCULAR | Status: AC
  Administered 2016-09-21: 40 mg via INTRAVENOUS
  Filled 2016-09-21: qty 4

## 2016-09-21 MED ORDER — SODIUM CHLORIDE 0.9% FLUSH: 3.0000 mL | INTRAVENOUS | Status: DC | PRN

## 2016-09-21 MED ORDER — POTASSIUM CHLORIDE CRYS ER 20 MEQ PO TBCR
20.0000 meq | EXTENDED_RELEASE_TABLET | Freq: Every day | ORAL | Status: DC
  Administered 2016-09-22 – 2016-09-24 (×3): 20 meq via ORAL
  Filled 2016-09-21 (×3): qty 1

## 2016-09-21 MED ORDER — SODIUM CHLORIDE 0.9% FLUSH
3.0000 mL | Freq: Two times a day (BID) | INTRAVENOUS | Status: DC
  Administered 2016-09-21 – 2016-09-24 (×7): 3 mL via INTRAVENOUS

## 2016-09-21 MED ORDER — POTASSIUM CHLORIDE CRYS ER 20 MEQ PO TBCR: 20.0000 meq | EXTENDED_RELEASE_TABLET | Freq: Every day | ORAL | Status: DC

## 2016-09-21 MED ORDER — ACETAMINOPHEN 325 MG PO TABS: 650.0000 mg | ORAL_TABLET | ORAL | Status: DC | PRN

## 2016-09-21 MED ORDER — METOPROLOL SUCCINATE ER 50 MG PO TB24
50.0000 mg | ORAL_TABLET | Freq: Every day | ORAL | Status: DC
  Administered 2016-09-22 – 2016-09-24 (×3): 50 mg via ORAL
  Filled 2016-09-21 (×3): qty 1

## 2016-09-21 MED ORDER — SODIUM CHLORIDE 0.9 % IV SOLN: 250.0000 mL | INTRAVENOUS | Status: DC | PRN

## 2016-09-21 MED ORDER — ASPIRIN EC 81 MG PO TBEC
81.0000 mg | DELAYED_RELEASE_TABLET | Freq: Every day | ORAL | Status: DC
  Administered 2016-09-21 – 2016-09-24 (×4): 81 mg via ORAL
  Filled 2016-09-21 (×4): qty 1

## 2016-09-21 MED ORDER — POTASSIUM CHLORIDE 10 MEQ/100ML IV SOLN
10.0000 meq | INTRAVENOUS | Status: AC
  Administered 2016-09-21: 10 meq via INTRAVENOUS
  Filled 2016-09-21: qty 100

## 2016-09-21 MED ORDER — HEPARIN SODIUM (PORCINE) 5000 UNIT/ML IJ SOLN
5000.0000 [IU] | Freq: Three times a day (TID) | INTRAMUSCULAR | Status: DC
  Administered 2016-09-21 – 2016-09-24 (×9): 5000 [IU] via SUBCUTANEOUS
  Filled 2016-09-21 (×9): qty 1

## 2016-09-21 MED ORDER — FUROSEMIDE 10 MG/ML IJ SOLN
40.0000 mg | Freq: Two times a day (BID) | INTRAMUSCULAR | Status: DC
  Administered 2016-09-21 – 2016-09-22 (×3): 40 mg via INTRAVENOUS
  Filled 2016-09-21 (×3): qty 4

## 2016-09-21 MED ORDER — TAMSULOSIN HCL 0.4 MG PO CAPS
0.4000 mg | ORAL_CAPSULE | Freq: Every evening | ORAL | Status: DC
  Administered 2016-09-21 – 2016-09-23 (×3): 0.4 mg via ORAL
  Filled 2016-09-21 (×4): qty 1

## 2016-09-21 MED ORDER — POTASSIUM CHLORIDE CRYS ER 20 MEQ PO TBCR
40.0000 meq | EXTENDED_RELEASE_TABLET | Freq: Once | ORAL | Status: AC
  Administered 2016-09-21: 40 meq via ORAL
  Filled 2016-09-21: qty 2

## 2016-09-21 MED ORDER — SIMVASTATIN 40 MG PO TABS
40.0000 mg | ORAL_TABLET | Freq: Every day | ORAL | Status: DC
  Administered 2016-09-21 – 2016-09-23 (×3): 40 mg via ORAL
  Filled 2016-09-21 (×3): qty 1

## 2016-09-21 MED ORDER — FUROSEMIDE 10 MG/ML IJ SOLN: 40.0000 mg | Freq: Every day | INTRAMUSCULAR | Status: DC

## 2016-09-21 MED ORDER — ONDANSETRON HCL 4 MG/2ML IJ SOLN: 4.0000 mg | Freq: Three times a day (TID) | INTRAMUSCULAR | Status: AC | PRN

## 2016-09-21 MED ORDER — METHIMAZOLE 5 MG PO TABS
5.0000 mg | ORAL_TABLET | ORAL | Status: DC
  Administered 2016-09-21 – 2016-09-23 (×2): 5 mg via ORAL
  Filled 2016-09-21 (×2): qty 1

## 2016-09-21 NOTE — ED Notes (Addendum)
Pt incontinent of urine; assisted to [facility] for BM; clean brief applied and chux placed on bed.

## 2016-09-21 NOTE — Significant Event (Signed)
Patient's personal valuables counted by me and another RN Berniece Salines.   Patient has two wallets with contents in each wallet.   These contents listed below kept in black wallet.  1) [employer] debit card X 1 2) Sam[employer] club carb X 1 3) [employer] card X 1 4) Citi card Chiropractor card) X 1 5) Sear card X 1 6) [employer] card X 1 7) One dollar bills X 2=$.200 8 ) Five dollar bills X 1=$5.00  These contents listed below kept in brown wallet.   1) twenty dollar bills X 14=$280.00 2) one dollar bills X 5=$5.00 3) ten dollar bills X [id number] 4) driver license 5) medical insurance card 6) sear citi card X 1 (broken on top of the card)  Total cash in both wallets is $302.00   These belongings are sealed inside an envelop in front of patient.The sealed envelop is taken to Security office by NT Katie.   Other belongings including a black cell-phone, clothes, and shoes are kept at bedside per patient's requests.

## 2016-09-21 NOTE — ED Provider Notes (Signed)
[facility] DEPT [facility] Provider Note   CSN: [id number] Arrival date & time: 09/21/16  [id number]     History   Chief Complaint Chief Complaint  Patient presents with  . Shortness of Breath    HPI [name] [name] is a 82 y.o. male with history of atrial fibrillation, CHF, CAD, hiatal hernia who presents with a one-week history of gradually worsening shortness of breath. Patient reports his shortness of breath is much worse on exertion, even bending over to tie his shoes. Patient denies any pain. Patient recently had his pacemaker checked and everything was going well. Patient denies any cough, fever, chest pain, abdominal pain, nausea, vomiting, urinary symptoms. No recent surgeries, new leg pain or swelling, or hospitalizations. Patient is not on oxygen at home. Patient was recently initiated on Lasix to "keep the fluid off his lungs." Patient is on Plavix for his chronic atrial fibrillation.  HPI  Past Medical History:  Diagnosis Date  . ANEMIA, IRON DEFICIENCY 10/13/2008  . ANXIETY 09/04/2008  . Aortic valvular disorder 08/16/2012   echo EF 45-50% LV mildly reduce,Biocor mitral valve,mild to mod. tricuspid regrug.,mild to mod. aortic regrug.  . Aortic valvular stenosis 11/19/2013  . ATRIAL FIBRILLATION, CHRONIC 01/14/2008   on warfarin since 2007  . BARRETTS ESOPHAGUS 03/20/2009  . BASAL CELL CARCINOMA OF SKIN SITE UNSPECIFIED 09/28/2010  . Cardiomyopathy, ischemic 11/19/2013   EF 45-50% with inferior wall hypokinesis by echo 2014  . CATARACTS, BILATERAL, HX OF 01/14/2008  . CEREBROVASCULAR ACCIDENT, HX OF 08/18/2008  . CHF (congestive heart failure) ([place])   . COAGULOPATHY, COUMADIN-INDUCED 01/14/2008  . COLONIC POLYPS, ADENOMATOUS 01/14/2008  . CORONARY ARTERY DISEASE 06/01/2007   Cath 04/03/2008; Cath 11/01/1990  . Dysrhythmia    HX OF T[place]Y BRADY  . Edema, peripheral 07/30/2009   LEV doppler- no thrombus or thrombophlebitis  . Elevated PSA   . GASTRIC POLYP 05/13/2009  . GERD  06/01/2007  . Hyperglycemia   . HYPERLIPIDEMIA 06/01/2007  . HYPERTENSION 06/01/2007  . HYPERTHYROIDISM 01/06/2011  . Mitral regurgitation 06/13/2008   mitral valve replacement #20 St. Jude Biocor; Maze procedure by Dr. Amador Cunas  at [facility]  . Mitral stenosis    Porcine bioprosthetic tissue valve placed 06/13/2008  . Mitral valve disorder 09/08/2011   echo EF 50-55% LV normal  . Pacemaker   . Peripheral artery disease ([place]) 06/16/2010   LEA doppler-left ABI nml 0.61 calcified vessels;right ABI  0.68  . PERSONAL HX COLONIC POLYPS 01/01/2010  . PLEURAL EFFUSION, LEFT 10/13/2008  . PROSTATE CANCER, HX OF 01/14/2008  . Prosthetic valve dysfunction    Mitral stenosis involving bioprosthetic tissue valve placed 06/13/2008  . Restless leg syndrome   . S/P CABG x 1 06/13/2008   SVG to LAD with open vein harvest right thigh, LIMA harvested but not utilized by Dr Amador Cunas  . S/P Maze operation for atrial fibrillation 06/13/2008   Partial left atrial lesion set using cryothermy for bilateral pulmonary vein isolation by Dr Amador Cunas  . S/P mitral valve replacement with bioprosthetic valve 06/13/2008   27mm St Jude Biocor porcine bioprosthetic tissue valve by Dr Amador Cunas  . 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 Tachy-brady syndrome ([place]) 07/13/2008   medtronic Adapta gen. model ADDR01 ,serial # [id number] by Dr  [name] at [facility]  . UNSPECIFIED VENOUS INSUFFICIENCY 09/18/2008    Patient Active Problem List   Diagnosis Date  Noted  . CHF (congestive heart failure) ([place]) 09/21/2016  . Persistent atrial fibrillation ([place]) 09/01/2016  . Chronic diastolic heart failure ([place]) 03/28/2016  . Diaphragmatic hernia 03/28/2016  . 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 Risk for falls 03/28/2016  . Paresthesia of left lower extremity 11/11/2015  . Hoarseness 12/23/2014  . Acute on chronic  diastolic heart failure ([place]) 06/02/2014  . GI bleed 05/17/2014  . Rectal bleed 05/17/2014  . Hypotension 05/17/2014  . 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 Acute combined systolic and diastolic heart failure ([place]) 04/06/2014  . Dizziness 04/05/2014  . SSS (sick sinus syndrome) ([place]) 03/25/2014  . CAD S/P percutaneous coronary angioplasty -  03/24/2014  . Perivalvular leak of prosthetic heart valve   . Mitral stenosis   . Routine general medical examination at a health care facility 12/31/2013  . Solitary pulmonary nodule 12/08/2013  . Pleural effusion 12/05/2013  . Dyspnea 12/05/2013  . Pacemaker - Medtronic Adapta, dual chamber 11/19/2013  . Restrictive lung disease 03/30/2013  . Encounter for long-term (current) use of other medications 08/17/2012  . BASAL CELL CARCINOMA OF SKIN SITE UNSPECIFIED 09/28/2010  . PERSONAL HX COLONIC POLYPS 01/01/2010  . BARRETTS ESOPHAGUS 03/20/2009  . Hematuria 10/21/2008  . Iron deficiency anemia 10/13/2008  . UNSPECIFIED VENOUS INSUFFICIENCY 09/18/2008  . ANXIETY 09/04/2008  . Laceration of leg 09/04/2008  . CEREBROVASCULAR ACCIDENT, HX OF 08/18/2008  . S/P CABG x 1 06/13/2008  . S/P mitral valve replacement with bioprosthetic valve 06/13/2008  . S/P Maze operation for atrial fibrillation 06/13/2008  . PROSTATE CANCER, HX OF 01/14/2008  . CATARACTS, BILATERAL, HX OF 01/14/2008  . Dyslipidemia 06/01/2007  . Essential hypertension 06/01/2007  . GERD 06/01/2007    Past Surgical History:  Procedure Laterality Date  . CARDIAC CATHETERIZATION  11/01/1990  . CARDIOVASCULAR STRESS TEST  06/03/2010   Persantine perfusion EF 45% mild to moderate ischemia basal and mid inferolateral region  . CARDIOVASCULAR STRESS TEST  01/01/2008   left ventricle normal,no significant ischemia  . CARDIOVASCULAR STRESS TEST  09/09/2005    possibility of ischemia inferior wall  . COLONOSCOPY  07/14/2009   diverticulosis, internal  hemorrhoids  . CORONARY ARTERY BYPASS GRAFT  06/13/2008   graft x1 w/vein graft LAD artery by Dr. Amador Cunas at [facility]  . CORONARY STENT PLACEMENT  03/24/2014   DES to LAD  . CYSTECTOMY     Left leg  . INTRAOPERATIVE TRANSESOPHAGEAL ECHOCARDIOGRAM N/A 04/22/2014   Procedure: INTRAOPERATIVE TRANSTHORACIC ECHOCARDIOGRAM;  Surgeon: [name], MD;  Location: [facility];  Service: Open Heart Surgery;  Laterality: N/A;  . LEFT AND RIGHT HEART CATHETERIZATION WITH CORONARY ANGIOGRAM N/A 01/08/2014   Procedure: LEFT AND RIGHT HEART CATHETERIZATION WITH CORONARY ANGIOGRAM;  Surgeon: Sanda Klein, MD;  Location: [facility] CATH LAB;  Service: Cardiovascular;  Laterality: N/A;  . MITRAL VALVE REPLACEMENT  06/13/2008   #20 St. Jude Bicor mitral valve ;Maze procedure at [facility] by Dr. Amador Cunas  . PACEMAKER PLACEMENT  07/13/2008   [place] dual chamber-Medtronic at [facility]   . PERCUTANEOUS CORONARY ROTOBLATOR INTERVENTION (PCI-R) N/A 03/24/2014   Procedure: PERCUTANEOUS CORONARY ROTOBLATOR INTERVENTION (PCI-R);  Surgeon: Blane Ohara, MD;  Location: [facility] CATH LAB;  Service: Cardiovascular;  Laterality: N/A;  . PROSTATECTOMY    . SKIN CANCER EXCISION     removed from forehead and right leg, nose/face  . TEE WITHOUT CARDIOVERSION N/A 01/22/2014   Procedure: TRANSESOPHAGEAL ECHOCARDIOGRAM (TEE);  Surgeon: Sanda Klein, MD;  Location: [place];  Service:  Cardiovascular;  Laterality: N/A;  . TONSILLECTOMY    . TRANSCATHETER AORTIC VALVE REPLACEMENT, TRANSFEMORAL N/A 04/22/2014   Procedure: TRANSCATHETER AORTIC VALVE REPLACEMENT, TRANSFEMORAL;  Surgeon: [name], MD;  Location: [place] [facility];  Service: Open Heart Surgery;  Laterality: N/A;  TAVR-TF (RIGHT SIDE)  . UPPER GASTROINTESTINAL ENDOSCOPY  07/14/2009   erosive reflux esopagitis, Barrett's esophagus    OB History    No data available       Home Medications    Prior to Admission medications   Medication Sig Start Date End Date Taking? Authorizing Provider    clopidogrel (PLAVIX) 75 MG tablet Take 1 tablet (75 mg total) by mouth daily with breakfast. 07/29/16   [name] [name], MD  furosemide (LASIX) 40 MG tablet Take 1 tablet (40 mg total) by mouth daily. 08/16/16 08/30/16  [name] [name], MD  methimazole (TAPAZOLE) 5 MG tablet TAKE 1 TABLET (5 MG TOTAL) BY MOUTH EVERY MONDAY, WEDNESDAY, AND FRIDAY. 03/31/15   [name], MD  metoprolol succinate (TOPROL-XL) 50 MG 24 hr tablet Take 1 tablet (50 mg total) by mouth daily. Take with or immediately following a meal. 10/15/15   [name] [name], MD  Multiple Vitamin (MULTIVITAMIN WITH MINERALS) TABS Take 1 tablet by mouth daily. Centrum Silver    Historical Provider, MD  pantoprazole (PROTONIX) 40 MG tablet Take 1 tablet (40 mg total) by mouth daily. Patient taking differently: Take 40 mg by mouth 2 (two) times daily.  05/24/16   Gatha Mayer, MD  potassium chloride SA (KLOR-CON M20) 20 MEQ tablet Take 1 tablet (20 mEq total) by mouth daily. 08/16/16 08/30/16  [name] [name], MD  simvastatin (ZOCOR) 40 MG tablet Take 1 tablet (40 mg total) by mouth at bedtime. 05/26/16   [name], MD  Tamsulosin HCl (FLOMAX) 0.4 MG CAPS Take 0.4 mg by mouth every evening.     Historical Provider, MD    Family History Family History  Problem Relation Age of Onset  . Breast cancer Mother   . Stroke Mother   . Transient ischemic attack Mother     Social History Social History  Substance Use Topics  . Smoking status: Former Smoker    Years: 2.00    Types: Pipe    Quit date: 11/07/1965  . Smokeless tobacco: Never Used     Comment: quit over 40 years ago, never smoked cigarettes  . Alcohol use No     Allergies   Patient has no known allergies.   Review of Systems Review of Systems  Constitutional: Negative for chills and fever.  HENT: Negative for facial swelling and sore throat.   Respiratory: Positive for shortness of breath. Negative for cough.   Cardiovascular: Negative for chest pain.   Gastrointestinal: Negative for abdominal pain, nausea and vomiting.  Genitourinary: Positive for frequency (chronic). Negative for dysuria.  Musculoskeletal: Negative for back pain.  Skin: Negative for rash and wound.  Neurological: Negative for headaches.  Psychiatric/Behavioral: The patient is not nervous/anxious.      Physical Exam Updated Vital Signs BP 157/92   Pulse 67   Temp 97.4 F (36.3 C) (Oral)   Resp 17   Ht 5\' 10"  (1.778 m)   Wt 74.8 kg   SpO2 97%   BMI 23.68 kg/m   Physical Exam  Constitutional: He appears well-developed and well-nourished. No distress.  HENT:  Head: Normocephalic and atraumatic.  Mouth/Throat: Oropharynx is clear and moist. No oropharyngeal exudate.  Eyes: Conjunctivae are normal. Pupils are equal, round, and reactive to  light. Right eye exhibits no discharge. Left eye exhibits no discharge. No scleral icterus.  Neck: Normal range of motion. Neck supple. No thyromegaly present.  Cardiovascular: Normal rate, regular rhythm, normal heart sounds and intact distal pulses.  Exam reveals no gallop and no friction rub.   No murmur heard. Pulmonary/Chest: Effort normal. No stridor. He has decreased breath sounds. He has no wheezes. He has no rales.  Patient cannot speak in complete sentences  Abdominal: Soft. Bowel sounds are normal. He exhibits no distension. There is no tenderness. There is no rebound and no guarding.  Musculoskeletal: He exhibits no edema.  No calf TTP bilaterally  Lymphadenopathy:    He has no cervical adenopathy.  Neurological: He is alert. Coordination normal.  Skin: Skin is warm and dry. No rash noted. He is not diaphoretic. No pallor.  Psychiatric: He has a normal mood and affect.  Nursing note and vitals reviewed.    ED Treatments / Results  Labs (all labs ordered are listed, but only abnormal results are displayed) Labs Reviewed  CBC WITH DIFFERENTIAL/PLATELET - Abnormal; Notable for the following:       Result  Value   RBC 3.90 (*)    Hemoglobin 11.9 (*)    HCT 37.0 (*)    Platelets 135 (*)    All other components within normal limits  BASIC METABOLIC PANEL - Abnormal; Notable for the following:    Glucose, Bld 125 (*)    BUN 23 (*)    Calcium 8.8 (*)    All other components within normal limits  BRAIN NATRIURETIC PEPTIDE - Abnormal; Notable for the following:    B Natriuretic Peptide 481.6 (*)    All other components within normal limits  TROPONIN I    EKG  EKG Interpretation  Date/Time:  Wednesday September 21 2016 09:02:38 EST Ventricular Rate:  76 PR Interval:    QRS Duration: 179 QT Interval:  489 QTC Calculation: 550 R Axis:   -63 Text Interpretation:  Atrial flutter with predominant 3:1 AV block Ventricular premature complex LVH with IVCD, LAD and secondary repol abnrm Anterolateral infarct, old No significant change since last tracing Confirmed by FLOYD MD, DANIEL [phone]) on 09/21/2016 9:22:41 AM       Radiology Dg Chest 2 View  Result Date: 09/21/2016 CLINICAL DATA:  Increased shortness of breath for the past 3 days. History of aortic stenosis, atrial fibrillation, pleural effusion, previous CABG and mitral valve replacement EXAM: CHEST  2 VIEW COMPARISON:  PA and lateral chest x-ray dated May 08, 2016 FINDINGS: The lungs are adequately inflated. The right hemidiaphragm appears higher than the left today which is a new finding. There are small bilateral pleural effusions greatest on the left. There is increased density at both lung bases consistent with atelectasis or infiltrate, greatest on the left. The cardiac silhouette is enlarged. A cage is present in the ascending aorta. There is calcification in the wall of the thoracic aorta. The ICD is in stable position. There is a large hiatal hernia. There is prominent thoracic kyphosis. IMPRESSION: Overall deterioration in the appearance of the chest since July 2017. CHF with mild interstitial edema. Bilateral pleural effusions  greatest on the left. Bibasilar atelectasis or pneumonia greatest on the left. Large hiatal hernia. Thoracic aortic atherosclerosis. Electronically Signed   By: David  Martinique M.D.   On: 09/21/2016 10:02    Procedures Procedures (including critical care time)  Medications Ordered in ED Medications  potassium chloride SA (K-DUR,KLOR-CON) CR tablet 40 mEq (  not administered)  potassium chloride 10 mEq in 100 mL IVPB (not administered)  furosemide (LASIX) injection 40 mg (40 mg Intravenous Given 09/21/16 1106)     Initial Impression / Assessment and Plan / ED Course  I have reviewed the triage vital signs and the nursing notes.  Pertinent labs & imaging results that were available during my care of the patient were reviewed by me and considered in my medical decision making (see chart for details).  Clinical Course     CBC shows stable chronic anemia, hemoglobin 11.9. BMP shows glucose 125, albumin 23, calcium 8.8. Troponin negative. BNP 481.6. CXR shows [Overall deterioration in the appearance of the chest since July 2017. CHF with mild interstitial edema. Bilateral pleural effusions greatest on the left. Bibasilar atelectasis or pneumonia greatest on the left. Large hiatal hernia.] Suspect CHF exacerbation.Lasix and potassium initiated in the ED. I spoke with Dr. Radford Pax with cardiology who states that Dr. [name] will admit the patient to stepdown for further evaluation and treatment. Patient also evaluated by Dr. Tyrone Nine who guided the patient's management and agrees with plan.  Final Clinical Impressions(s) / ED Diagnoses   Final diagnoses:  Shortness of breath    New Prescriptions New Prescriptions   No medications on file     Frederica Kuster, PA-C 09/21/16 [place], DO 09/21/16 1247

## 2016-09-21 NOTE — Care Management Note (Signed)
Case Management Note  Patient Details  Name: NAWEED DOLLARHIDE MRN: [id number] Date of Birth: [birth year]  Subjective/Objective:       Adm w fld overload             Action/Plan:diurese   Expected Discharge Date:                  Expected Discharge Plan:  [place]  In-House Referral:     Discharge planning Services     Post Acute Care Choice:    Choice offered to:     DME Arranged:    DME Agency:     HH Arranged:    HH Agency:     Status of Service:  In process, will continue to follow  If discussed at Long Length of Stay Meetings, dates discussed:    Additional Comments:lives at home, may need hhc will moniter for dc needs as pt progresses.  Lacretia Leigh, RN 09/21/2016, 2:34 PM

## 2016-09-21 NOTE — ED Notes (Signed)
Unable to interrogate pacemaker after multiple attempts.

## 2016-09-21 NOTE — H&P (Signed)
History & Physical    Patient ID: [name] [name] MRN: [id number], DOB/AGE: 02/03/1929   Admit date: 09/21/2016   Primary Physician: [name], MD Primary Cardiologist: Dr. [name]  Patient Profile    82 yo male with PMH of MVR with CABGx1 (09), TAVR 6/15, persistent atrial fibrillation and SSS s/p MDT pacemaker who presented to [facility] with increasing dyspnea over the past couple of days.   Past Medical History    Past Medical History:  Diagnosis Date  . ANEMIA, IRON DEFICIENCY 10/13/2008  . ANXIETY 09/04/2008  . Aortic valvular disorder 08/16/2012   echo EF 45-50% LV mildly reduce,Biocor mitral valve,mild to mod. tricuspid regrug.,mild to mod. aortic regrug.  . Aortic valvular stenosis 11/19/2013  . ATRIAL FIBRILLATION, CHRONIC 01/14/2008   on warfarin since 2007  . BARRETTS ESOPHAGUS 03/20/2009  . BASAL CELL CARCINOMA OF SKIN SITE UNSPECIFIED 09/28/2010  . Cardiomyopathy, ischemic 11/19/2013   EF 45-50% with inferior wall hypokinesis by echo 2014  . CATARACTS, BILATERAL, HX OF 01/14/2008  . CEREBROVASCULAR ACCIDENT, HX OF 08/18/2008  . CHF (congestive heart failure) ([place])   . COAGULOPATHY, COUMADIN-INDUCED 01/14/2008  . COLONIC POLYPS, ADENOMATOUS 01/14/2008  . CORONARY ARTERY DISEASE 06/01/2007   Cath 04/03/2008; Cath 11/01/1990  . Dysrhythmia    HX OF T[place]Y BRADY  . Edema, peripheral 07/30/2009   LEV doppler- no thrombus or thrombophlebitis  . Elevated PSA   . GASTRIC POLYP 05/13/2009  . GERD 06/01/2007  . Hyperglycemia   . HYPERLIPIDEMIA 06/01/2007  . HYPERTENSION 06/01/2007  . HYPERTHYROIDISM 01/06/2011  . Mitral regurgitation 06/13/2008   mitral valve replacement #20 St. Jude Biocor; Maze procedure by Dr. Amador Cunas  at [facility]  . Mitral stenosis    Porcine bioprosthetic tissue valve placed 06/13/2008  . Mitral valve disorder 09/08/2011   echo EF 50-55% LV normal  . Pacemaker   . Peripheral artery disease ([place]) 06/16/2010   LEA doppler-left ABI nml 0.61 calcified vessels;right  ABI  0.68  . PERSONAL HX COLONIC POLYPS 01/01/2010  . PLEURAL EFFUSION, LEFT 10/13/2008  . PROSTATE CANCER, HX OF 01/14/2008  . Prosthetic valve dysfunction    Mitral stenosis involving bioprosthetic tissue valve placed 06/13/2008  . Restless leg syndrome   . S/P CABG x 1 06/13/2008   SVG to LAD with open vein harvest right thigh, LIMA harvested but not utilized by Dr Amador Cunas  . S/P Maze operation for atrial fibrillation 06/13/2008   Partial left atrial lesion set using cryothermy for bilateral pulmonary vein isolation by Dr Amador Cunas  . S/P mitral valve replacement with bioprosthetic valve 06/13/2008   72mm St Jude Biocor porcine bioprosthetic tissue valve by Dr Amador Cunas  . 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 Tachy-brady syndrome ([place]) 07/13/2008   medtronic Adapta gen. model ADDR01 ,serial # [id number] H by Dr  [name] at [facility]  . UNSPECIFIED VENOUS INSUFFICIENCY 09/18/2008    Past Surgical History:  Procedure Laterality Date  . CARDIAC CATHETERIZATION  11/01/1990  . CARDIOVASCULAR STRESS TEST  06/03/2010   Persantine perfusion EF 45% mild to moderate ischemia basal and mid inferolateral region  . CARDIOVASCULAR STRESS TEST  01/01/2008   left ventricle normal,no significant ischemia  . CARDIOVASCULAR STRESS TEST  09/09/2005    possibility of ischemia inferior wall  . COLONOSCOPY  07/14/2009   diverticulosis, internal hemorrhoids  . CORONARY ARTERY BYPASS GRAFT  06/13/2008   graft x1  w/vein graft LAD artery by Dr. Amador Cunas at [facility]  . CORONARY STENT PLACEMENT  03/24/2014   DES to LAD  . CYSTECTOMY     Left leg  . INTRAOPERATIVE TRANSESOPHAGEAL ECHOCARDIOGRAM N/A 04/22/2014   Procedure: INTRAOPERATIVE TRANSTHORACIC ECHOCARDIOGRAM;  Surgeon: [name], MD;  Location: [place];  Service: Open Heart Surgery;  Laterality: N/A;  . LEFT AND RIGHT HEART CATHETERIZATION WITH  CORONARY ANGIOGRAM N/A 01/08/2014   Procedure: LEFT AND RIGHT HEART CATHETERIZATION WITH CORONARY ANGIOGRAM;  Surgeon: Sanda Klein, MD;  Location: [place] CATH LAB;  Service: Cardiovascular;  Laterality: N/A;  . MITRAL VALVE REPLACEMENT  06/13/2008   #20 St. Jude Bicor mitral valve ;Maze procedure at [facility] by Dr. Amador Cunas  . PACEMAKER PLACEMENT  07/13/2008   [place] dual chamber-Medtronic at [facility]   . PERCUTANEOUS CORONARY ROTOBLATOR INTERVENTION (PCI-R) N/A 03/24/2014   Procedure: PERCUTANEOUS CORONARY ROTOBLATOR INTERVENTION (PCI-R);  Surgeon: Blane Ohara, MD;  Location: [facility] CATH LAB;  Service: Cardiovascular;  Laterality: N/A;  . PROSTATECTOMY    . SKIN CANCER EXCISION     removed from forehead and right leg, nose/face  . TEE WITHOUT CARDIOVERSION N/A 01/22/2014   Procedure: TRANSESOPHAGEAL ECHOCARDIOGRAM (TEE);  Surgeon: Sanda Klein, MD;  Location: [facility] ENDOSCOPY;  Service: Cardiovascular;  Laterality: N/A;  . TONSILLECTOMY    . TRANSCATHETER AORTIC VALVE REPLACEMENT, TRANSFEMORAL N/A 04/22/2014   Procedure: TRANSCATHETER AORTIC VALVE REPLACEMENT, TRANSFEMORAL;  Surgeon: [name], MD;  Location: [place];  Service: Open Heart Surgery;  Laterality: N/A;  TAVR-TF (RIGHT SIDE)  . UPPER GASTROINTESTINAL ENDOSCOPY  07/14/2009   erosive reflux esopagitis, Barrett's esophagus     Allergies  No Known Allergies  History of Present Illness    [name] [name] is an 82 yo male with extensive cardiac hx who is followed by Dr. [name]. The pt had MVR with CABG x 1 at [place] in 2009. He had an LAD DES in May 2015 followed by TAVR in June 2015. He has persistent atrial fibrillation and SSS. He is s/p MDT pacemaker and is pacer dependant. He is not anticoagulated secondary to a history of GI bleeding. He had Amiodarone induced hyperthyroidism. He has had some problems with diastolic CHF. His last EF was 55% by echo June 2017. He saw Dr [name] in the office 08/16/16 and was placed on daily Lasix and K+ for  suspected volume overload.Device check noted 80% ventricular pacing over the last 6 months, 21% atrial pacing. He was followed up in the office on 10/26 with [name] where he reported feeling better, though his weight was unchanged. It was felt that his large hiatal hernia was also contributing to his supine dyspnea.   He reports compliance issues with taking his lasix. States he is a very busy person and often not near a restroom. States he did have an accident in public with the use of lasix in the past and has been fearfully of this happening again. Reports he was compliant with his lasix since being seen in the office by Lurena Joiner until the past couple of days. States he stopped taking his lasix for about 3-4 days ago. He noticed his breathing did worsen, was unable to lay supine to sleep. Attempted to resume his lasix today, but he had become significantly short of breath.   He presented to [facility] today. He was severely dyspneic, only able to speak 3-4 words at a time between breaths. Labs showed stable electrolytes, BNP 481 (237 at last office visit in 10/17), Hgb 11.9. CXR  with bilateral pleural effusions with atelectasis vs PNA. EKG showed AF with v pacing. He was given 40mg  IV lasix with UOP. Cardiology was called and transfer was made to [facility] for further treatment.   Home Medications    Prior to Admission medications   Medication Sig Start Date End Date Taking? Authorizing Provider  clopidogrel (PLAVIX) 75 MG tablet Take 1 tablet (75 mg total) by mouth daily with breakfast. 07/29/16  Yes Ifeanyi Mickelson, MD  furosemide (LASIX) 40 MG tablet Take 1 tablet (40 mg total) by mouth daily. 08/16/16 09/21/16 Yes Natara Monfort, MD  methimazole (TAPAZOLE) 5 MG tablet TAKE 1 TABLET (5 MG TOTAL) BY MOUTH EVERY MONDAY, WEDNESDAY, AND FRIDAY. 03/31/15  Yes [name], MD  metoprolol succinate (TOPROL-XL) 50 MG 24 hr tablet Take 1 tablet (50 mg total) by mouth daily. Take with or immediately following a meal.  10/15/15  Yes Ronna Herskowitz, MD  Multiple Vitamin (MULTIVITAMIN WITH MINERALS) TABS Take 1 tablet by mouth daily. Centrum Silver   Yes Historical Provider, MD  pantoprazole (PROTONIX) 40 MG tablet Take 1 tablet (40 mg total) by mouth daily. 05/24/16  Yes Gatha Mayer, MD  potassium chloride SA (KLOR-CON M20) 20 MEQ tablet Take 1 tablet (20 mEq total) by mouth daily. 08/16/16 09/21/16 Yes Jazzmon Prindle, MD  simvastatin (ZOCOR) 40 MG tablet Take 1 tablet (40 mg total) by mouth at bedtime. 05/26/16  Yes [name], MD  Tamsulosin HCl (FLOMAX) 0.4 MG CAPS Take 0.4 mg by mouth every evening.    Yes Historical Provider, MD    Family History    Family History  Problem Relation Age of Onset  . Breast cancer Mother   . Stroke Mother   . Transient ischemic attack Mother     Social History    Social History   Social History  . Marital status: Married    Spouse name: N/A  . Number of children: 4  . Years of education: N/A   Occupational History  . Retired Retired   Social History Main Topics  . Smoking status: Former Smoker    Years: 2.00    Types: Pipe    Quit date: 11/07/1965  . Smokeless tobacco: Never Used     Comment: quit over 40 years ago, never smoked cigarettes  . Alcohol use No  . Drug use: No  . Sexual activity: Not on file   Other Topics Concern  . Not on file   Social History Narrative  . No narrative on file     Review of Systems    General:  No chills, fever, night sweats or weight changes.  Cardiovascular:  See HPI Dermatological: No rash, lesions/masses Respiratory: No cough, ++ dyspnea Urologic: No hematuria, dysuria Abdominal:   No nausea, vomiting, diarrhea, bright red blood per rectum, melena, or hematemesis Neurologic:  No visual changes, wkns, changes in mental status. All other systems reviewed and are otherwise negative except as noted above.  Physical Exam    Blood pressure (!) 150/90, pulse 88, temperature 97.4 F (36.3 C), temperature  source Oral, resp. rate (!) 26, height 5\' 10"  (1.778 m), weight 172 lb 2.9 oz (78.1 kg), SpO2 95 %.  General: Pleasant older WM, NAD Psych: Normal affect. Neuro: Alert and oriented X 3. Moves all extremities spontaneously. HEENT: Normal  Neck: Supple without bruits, + JVD. Lungs:  Resp regular and unlabored, bilateral crackles. Heart: Vpaced + s3, s4, or murmurs. Abdomen: Soft, non-tender, non-distended, BS + x 4.  Extremities: No  clubbing, cyanosis 1+ LE edema. DP/PT/Radials 2+ and equal bilaterally.  Labs    Troponin (Point of Care Test) No results for input(s): TROPIPOC in the last 72 hours.  Recent Labs  09/21/16 0919  TROPONINI <0.03   Lab Results  Component Value Date   WBC 7.3 09/21/2016   HGB 11.9 (L) 09/21/2016   HCT 37.0 (L) 09/21/2016   MCV 94.9 09/21/2016   PLT 135 (L) 09/21/2016    Recent Labs Lab 09/21/16 0919  NA 144  K 3.7  CL 110  CO2 29  BUN 23*  CREATININE 0.76  CALCIUM 8.8*  GLUCOSE 125*   Lab Results  Component Value Date   CHOL 120 03/14/2016   HDL 42.60 03/14/2016   LDLCALC 68 03/14/2016   TRIG 48.0 03/14/2016   No results found for: [facility]   Radiology Studies    Dg Chest 2 View  Result Date: 09/21/2016 CLINICAL DATA:  Increased shortness of breath for the past 3 days. History of aortic stenosis, atrial fibrillation, pleural effusion, previous CABG and mitral valve replacement EXAM: CHEST  2 VIEW COMPARISON:  PA and lateral chest x-ray dated May 08, 2016 FINDINGS: The lungs are adequately inflated. The right hemidiaphragm appears higher than the left today which is a new finding. There are small bilateral pleural effusions greatest on the left. There is increased density at both lung bases consistent with atelectasis or infiltrate, greatest on the left. The cardiac silhouette is enlarged. A cage is present in the ascending aorta. There is calcification in the wall of the thoracic aorta. The ICD is in stable position. There is a large  hiatal hernia. There is prominent thoracic kyphosis. IMPRESSION: Overall deterioration in the appearance of the chest since July 2017. CHF with mild interstitial edema. Bilateral pleural effusions greatest on the left. Bibasilar atelectasis or pneumonia greatest on the left. Large hiatal hernia. Thoracic aortic atherosclerosis. Electronically Signed   By: David  Martinique M.D.   On: 09/21/2016 10:02    ECG & Cardiac Imaging    EKG: Atrial Fib V-paced   Echo: 6/17  Study Conclusions  - Left ventricle: The cavity size was normal. Wall thickness was   increased in a pattern of mild LVH. Apical hypokinesis.   Indeterminant diastolic function. The estimated ejection fraction   was 55%. - Aortic valve: There was a bioprosthetic aortic valve, s/p TAVR.   No significant stenosis. Mild peri-valvular regurgitation. Mean   gradient (S): 6 mm Hg. Valve area (Vmax): 2.6 cm^2. - Aorta: Mildly dilated ascending aorta and aortic root. Aortic   root dimension: 40 mm (ED). Ascending aortic diameter: 42 mm (S). - Mitral valve: Bioprosthetic mitral valve replacement. No   significant stenosis, pressure halftime not elevated. There was   no significant regurgitation. Pressure half-time: 129 ms. Mean   gradient (D): 5 mm Hg. Valve area by pressure half-time: 1.71   cm^2. - Left atrium: The atrium was moderately dilated. - Right ventricle: The cavity size was normal. Pacer wire or   catheter noted in right ventricle. Systolic function was mildly   reduced. - Right atrium: The atrium was mildly dilated. - Tricuspid valve: Peak RV-RA gradient (S): 38 mm Hg. - Pulmonary arteries: PA peak pressure: 46 mm Hg (S). - Systemic veins: IVC measured 2.3 cm with > 50% respirophasic   variation, suggesting RA pressure 8 mmHg.  Impressions:  - Normal LV size with mild LV hypertrophy. EF 55% with apical   hypokinesis. Normal RV size with mildly decreased systolic  function. Mild pulmonary hypertension. Status post  TAVR,   bioprosthetic aortic valve had mild peri-valvular regurgitation   and no significant stenosis. Bioprosthetic mitral valve without   significant regurgitation or stenosis.  Assessment & Plan    82 yo male with PMH of MVR with CABGx1 (09), TAVR 6/15, persistent atrial fibrillation and SSS s/p MDT pacemaker who presented to [facility] with increasing dyspnea over the past couple of days.   1. Acute on Chronic diastolic? HF: Was placed on daily lasix 40mg  PO at last office visit with Dr. [name], reports he had been compliant with this until a couple of days ago when he stopped taking them. Reports taking lasix is difficult because he is a very active person and not always near a bathroom. [facility] was [facility] at [facility], up from recent office visit. He has crackles on exam and JVD. Received one dose of IV lasix 40mg  at [facility] with UOP. Breathing somewhat improved.  -- Will continue with IV lasix 40mg  BID, monitor BMET -- Stressed the importance of taking daily lasix to prevent volume overload.   2. Persistent Atrial Fib/SSS s/p MDT PPM: Last check in the office 80% ventricular pacing over the last 6 months, 21% atrial pacing. EKG with atrial fib, V pacing. -- no anticoagulation as he has a hx of GI bleeding.   3. MVR/TAVR: Last Echo showed stable valves.   4. HTN: Somewhat elevated, on Toprol XL 50mg , will hold on increasing at this time with IV diuretic.   Barnet Pall, NP-C Pager [phone] 09/21/2016, 2:41 PM  I have seen and examined the patient along with Reino Bellis, NP-C.  I have reviewed the chart, notes and new data.  I agree with NP's note.  Key new complaints: still very dyspneic, speaking in interrupted sentences. No angina. Key examination changes: JVP to angle of jaw, no significant murmurs. Reduced breath sounds in both bases. Key new findings / data: Afib, V paced. Device last checked in Sept 59% AFib, 34% Vpaced, rare HVR.  PLAN: IV diuretics. Reinforce need for  compliance with diuretics and daily weight monitoring.  Check device to see if rate control has deteriorated or conversely, if there is excessive V pacing. In past, AFib burden has not seemed to have any correlation with dyspnea/heart failure. (in fact amiodarone was stopped when it appeared he had settled in permanent AF and he was not symptomatic then). He has an enormous diaphragmatic hernia, with most of his stomach and his transverse colon occupying a large part of his thoracic cavity. This reduces his lung reserve and likely helps to cause rapid decompensation of his respiratory status.  Sanda Klein, MD, [place] [phone] 09/21/2016, 4:04 PM

## 2016-09-21 NOTE — Significant Event (Signed)
Accepted patient from [place] ED. Patient arrived alert and oriented X 4, intact PIV on right AC that is saline-locked. EMS reported that patient just finished the last run of potassium. Patient arrived with pink, nonblanchable pressure ulcer on sacrum and buttocks-staff placed a foam sacral dressing on it and repositioned patient to side. Patient arrived with a brief which was soiled with urine. Bath given. Family updated in room. Patient oriented to room and unit. Admitting team paged. Bed in lowest position.

## 2016-09-21 NOTE — ED Triage Notes (Signed)
Pt having increased sob for three days.  Pt unable to speak 3-4 words without stopping.  Pt states no fever.  No cough.  No edema noted.

## 2016-09-22 DIAGNOSIS — J984 Other disorders of lung: Secondary | ICD-10-CM

## 2016-09-22 DIAGNOSIS — I495 Sick sinus syndrome: Secondary | ICD-10-CM

## 2016-09-22 LAB — BASIC METABOLIC PANEL
ANION GAP: 6 (ref 5–15)
BUN: 19 mg/dL (ref 6–20)
CHLORIDE: 108 mmol/L (ref 101–111)
CO2: 30 mmol/L (ref 22–32)
Calcium: 8.6 mg/dL — ABNORMAL LOW (ref 8.9–10.3)
Creatinine, Ser: 0.83 mg/dL (ref 0.61–1.24)
GFR calc non Af Amer: >60 mL/min (ref >60)
Glucose, Bld: 96 mg/dL (ref 65–99)
POTASSIUM: 4.1 mmol/L (ref 3.5–5.1)
SODIUM: 144 mmol/L (ref 135–145)

## 2016-09-22 MED ORDER — ORAL CARE MOUTH RINSE
15.0000 mL | Freq: Two times a day (BID) | OROMUCOSAL | Status: DC
  Administered 2016-09-22 – 2016-09-23 (×3): 15 mL via OROMUCOSAL

## 2016-09-22 NOTE — Progress Notes (Signed)
Patient Name: [name] [name] Date of Encounter: 09/22/2016  Primary Cardiologist: [facility] Problem List     Principal Problem:   Acute on chronic diastolic CHF (congestive heart failure) ([place]) Active Problems:   Restrictive lung disease   Pacemaker - Medtronic Adapta, dual chamber   S/P mitral valve replacement with bioprosthetic valve   SSS (sick sinus syndrome) (H[facility])   S/P TAVR (transcatheter aortic valve replacement)   Persistent atrial fibrillation ([facility])   CHF (congestive heart failure) ([place])     Subjective   Breathing greatly improved. 1.3L net diuresis K and creat OK.  Inpatient Medications    Scheduled Meds: . aspirin EC  81 mg Oral Daily  . clopidogrel  75 mg Oral Q breakfast  . furosemide  40 mg Intravenous BID  . heparin  5,000 Units Subcutaneous Q8H  . methimazole  5 mg Oral Q M,W,F  . metoprolol succinate  50 mg Oral Daily  . pantoprazole  40 mg Oral Daily  . potassium chloride SA  20 mEq Oral Daily  . simvastatin  40 mg Oral QHS  . sodium chloride flush  3 mL Intravenous Q12H  . tamsulosin  0.4 mg Oral QPM   Continuous Infusions:  PRN Meds: sodium chloride, acetaminophen, sodium chloride flush   Vital Signs    Vitals:   09/22/16 0400 09/22/16 0500 09/22/16 0600 09/22/16 0758  BP: 129/73   126/78  Pulse: 97 (!) 48 (!) 52 (!) 48  Resp: 17 (!) 26 16 (!) 21  Temp:      TempSrc:      SpO2: 95% 97% 97% (!) 85%  Weight:  171 lb 1.2 oz (77.6 kg)    Height:        Intake/Output Summary (Last 24 hours) at 09/22/16 0910 Last data filed at 09/22/16 0500  Gross per 24 hour  Intake              580 ml  Output             1875 ml  Net            -1295 ml   Filed Weights   09/21/16 0848 09/21/16 1342 09/22/16 0500  Weight: 165 lb (74.8 kg) 172 lb 2.9 oz (78.1 kg) 171 lb 1.2 oz (77.6 kg)    Physical Exam  Appears more comfortable, still mildly orthopneic. GEN: Well nourished, well developed, in no acute distress.  HEENT: Grossly  normal.  Neck: Supple, 9-10 cm JVP, no carotid bruits, or masses. Cardiac: irregular, distant heart sounds, 1-2/5 Ao ej murmur no diastolic murmurs, rubs, or gallops. No clubbing, cyanosis, edema.  Radials/DP/PT 2+ and equal bilaterally.  Respiratory:  Respirations regular and unlabored, clear to auscultation bilaterally. GI: Soft, nontender, nondistended, BS + x 4. MS: no deformity or atrophy. Skin: warm and dry, no rash. Neuro:  Strength and sensation are intact. Psych: AAOx3.  Normal affect.  Labs    CBC  Recent Labs  09/21/16 0919 09/21/16 1528  WBC 7.3 7.3  NEUTROABS 5.6  --   HGB 11.9* 12.5*  HCT 37.0* 39.4  MCV 94.9 92.7  PLT 135* [id number]*   Basic Metabolic Panel  Recent Labs  09/21/16 0919 09/21/16 1528 09/22/16 0217  NA 144  --  144  K 3.7  --  4.1  CL 110  --  108  CO2 29  --  30  GLUCOSE 125*  --  96  BUN 23*  --  19  CREATININE  0.76 0.84 0.83  CALCIUM 8.8*  --  8.6*     Telemetry    AFib, V pacing is less prevalent - Personally Reviewed  ECG    AF, V paced - Personally Reviewed  Radiology    Dg Chest 2 View  Result Date: 09/21/2016 CLINICAL DATA:  Increased shortness of breath for the past 3 days. History of aortic stenosis, atrial fibrillation, pleural effusion, previous CABG and mitral valve replacement EXAM: CHEST  2 VIEW COMPARISON:  PA and lateral chest x-ray dated May 08, 2016 FINDINGS: The lungs are adequately inflated. The right hemidiaphragm appears higher than the left today which is a new finding. There are small bilateral pleural effusions greatest on the left. There is increased density at both lung bases consistent with atelectasis or infiltrate, greatest on the left. The cardiac silhouette is enlarged. A cage is present in the ascending aorta. There is calcification in the wall of the thoracic aorta. The ICD is in stable position. There is a large hiatal hernia. There is prominent thoracic kyphosis. IMPRESSION: Overall deterioration in  the appearance of the chest since July 2017. CHF with mild interstitial edema. Bilateral pleural effusions greatest on the left. Bibasilar atelectasis or pneumonia greatest on the left. Large hiatal hernia. Thoracic aortic atherosclerosis. Electronically Signed   By: David  Martinique M.D.   On: 09/21/2016 10:02    Patient Profile     82 yo with acute diastolic heart failure exacerbation, at least in part due to limited compliance with diuretics, in the setting of CAD s/p CABG, AS s/p TAVR with mild perivalvular leak, bioprosthetic MVR, long term persistent AF with dual chamber permanent pacemaker and enormous diaphragmatic hernia.  Assessment & Plan    1. Acute on chronic diastolic HF:  Most recent EF 60%. LVEF seems to drop when he has V pacing. He has been skipping diuretics to allow him to pursue daily activities outside the home (still teaches art classes). Discussed sodium dietary restriction, daily weight monitoring, the concept of "dry weight" and diuretic dose adjustment. Adjusted PPM to reduce V pacing. 2. AFib: although he rarely has NSR, will switch to VVI pacing to reduce the need for V pacing and allow hysteresis down to 50 bpm. Consider BiV upgrade if LVEF falls. 3. AS s/p TAVR: recent echo with normal gradients and mild AI. 4. S/P MVR: large diameter prosthesis (29 mm) with good gradients. 5. AFib:  Rate controlled. Has not associated hemodynamic changes or symptoms in the past  6. PPM: adjusted programming as above. 7. Restrictive lung disease due to very large diaphragmatic hernia: lowers threshold for decompensation. 8. HTN: controlled.  Signed, Sanda Klein, MD  09/22/2016, 9:10 AM

## 2016-09-23 DIAGNOSIS — L89151 Pressure ulcer of sacral region, stage 1: Secondary | ICD-10-CM | POA: Diagnosis present

## 2016-09-23 LAB — BASIC METABOLIC PANEL
Anion gap: 8 (ref 5–15)
BUN: 23 mg/dL — AB (ref 6–20)
CHLORIDE: 102 mmol/L (ref 101–111)
CO2: 32 mmol/L (ref 22–32)
Calcium: 8.7 mg/dL — ABNORMAL LOW (ref 8.9–10.3)
Creatinine, Ser: 0.95 mg/dL (ref 0.61–1.24)
GFR calc Af Amer: >60 mL/min (ref >60)
GFR calc non Af Amer: >60 mL/min (ref >60)
GLUCOSE: 96 mg/dL (ref 65–99)
POTASSIUM: 4 mmol/L (ref 3.5–5.1)
Sodium: 142 mmol/L (ref 135–145)

## 2016-09-23 MED ORDER — FUROSEMIDE 80 MG PO TABS
80.0000 mg | ORAL_TABLET | Freq: Every day | ORAL | Status: DC
  Administered 2016-09-24: 80 mg via ORAL
  Filled 2016-09-23: qty 1

## 2016-09-23 NOTE — Progress Notes (Signed)
Patient Name: [name] [name] Date of Encounter: 09/23/2016  Primary Cardiologist: [facility] Problem List     Principal Problem:   Acute on chronic diastolic CHF (congestive heart failure) ([place]) Active Problems:   Restrictive lung disease   Pacemaker - Medtronic Adapta, dual chamber   S/P mitral valve replacement with bioprosthetic valve   SSS (sick sinus syndrome) (HCC)   S/P TAVR (transcatheter aortic valve replacement)   Persistent atrial fibrillation (HCC)   CHF (congestive heart failure) ([place])     Subjective   Breathing greatly improved, lying flat now. Weight decreased 10 lb since admission.  Inpatient Medications    Scheduled Meds: . aspirin EC  81 mg Oral Daily  . clopidogrel  75 mg Oral Q breakfast  . furosemide  40 mg Intravenous BID  . heparin  5,000 Units Subcutaneous Q8H  . mouth rinse  15 mL Mouth Rinse BID  . methimazole  5 mg Oral Q M,W,F  . metoprolol succinate  50 mg Oral Daily  . pantoprazole  40 mg Oral Daily  . potassium chloride SA  20 mEq Oral Daily  . simvastatin  40 mg Oral QHS  . sodium chloride flush  3 mL Intravenous Q12H  . tamsulosin  0.4 mg Oral QPM   Continuous Infusions:  PRN Meds: sodium chloride, acetaminophen, sodium chloride flush   Vital Signs    Vitals:   09/22/16 2321 09/23/16 0000 09/23/16 0300 09/23/16 0700  BP: 124/69  133/78   Pulse: (!) 52 (!) 52 (!) 50 (!) 51  Resp: (!) 22 (!) 22 17 17   Temp: 97.2 F (36.2 C)  97.4 F (36.3 C)   TempSrc: Oral  Oral   SpO2: 94% 96% 98% 98%  Weight:   162 lb 0.6 oz (73.5 kg)   Height:        Intake/Output Summary (Last 24 hours) at 09/23/16 0834 Last data filed at 09/22/16 2156  Gross per 24 hour  Intake                3 ml  Output              875 ml  Net             -872 ml   Filed Weights   09/21/16 1342 09/22/16 0500 09/23/16 0300  Weight: 172 lb 2.9 oz (78.1 kg) 171 lb 1.2 oz (77.6 kg) 162 lb 0.6 oz (73.5 kg)    Physical Exam   GEN: Well nourished,  well developed, in no acute distress.  HEENT: Grossly normal.  Neck: Supple, JVP 6-7 cm, carotid bruits, or masses. Cardiac: RRR, distant heart sounds, 1-2/6 aortic ejection murmur, no diastolic murmurs, rubs, or gallops. No clubbing, cyanosis, edema.  Radials/DP/PT 2+ and equal bilaterally.  Respiratory:  Respirations regular and unlabored, clear to auscultation bilaterally. GI: Soft, nontender, nondistended, BS + x 4. MS: no deformity or atrophy. Skin: warm and dry, no rash.  pink, nonblanchable pressure ulcer on sacrum and buttocks, foam sacral dressing on it and repositioned patient to side Neuro:  Strength and sensation are intact. Psych: AAOx3.  Normal affect.  Labs    CBC  Recent Labs  09/21/16 0919 09/21/16 1528  WBC 7.3 7.3  NEUTROABS 5.6  --   HGB 11.9* 12.5*  HCT 37.0* 39.4  MCV 94.9 92.7  PLT 135* Q000111Q*   Basic Metabolic Panel  Recent Labs  09/22/16 0217 09/23/16 0250  NA 144 142  K 4.1 4.0  CL 108 102  CO2 30 32  GLUCOSE 96 96  BUN 19 23*  CREATININE 0.83 0.95  CALCIUM 8.6* 8.7*    Recent Labs  09/21/16 0919  TROPONINI <0.03    Telemetry    AFib with slow VR (50s) and rare V pacing - Personally Reviewed   Radiology    Dg Chest 2 View  Result Date: 09/21/2016 CLINICAL DATA:  Increased shortness of breath for the past 3 days. History of aortic stenosis, atrial fibrillation, pleural effusion, previous CABG and mitral valve replacement EXAM: CHEST  2 VIEW COMPARISON:  PA and lateral chest x-ray dated May 08, 2016 FINDINGS: The lungs are adequately inflated. The right hemidiaphragm appears higher than the left today which is a new finding. There are small bilateral pleural effusions greatest on the left. There is increased density at both lung bases consistent with atelectasis or infiltrate, greatest on the left. The cardiac silhouette is enlarged. A cage is present in the ascending aorta. There is calcification in the wall of the thoracic aorta. The  ICD is in stable position. There is a large hiatal hernia. There is prominent thoracic kyphosis. IMPRESSION: Overall deterioration in the appearance of the chest since July 2017. CHF with mild interstitial edema. Bilateral pleural effusions greatest on the left. Bibasilar atelectasis or pneumonia greatest on the left. Large hiatal hernia. Thoracic aortic atherosclerosis. Electronically Signed   By: David  Martinique M.D.   On: 09/21/2016 10:02    Patient Profile     82 yo with acute diastolic heart failure exacerbation, at least in part due to limited compliance with diuretics, in the setting of CAD s/p CABG, AS s/p TAVR with mild perivalvular leak, bioprosthetic MVR, long term persistent AF with dual chamber permanent pacemaker and enormous diaphragmatic hernia.   Assessment & Plan    1. Acute on chronic diastolic HF:  Most recent EF 60%. LVEF seems to drop when he has V pacing so we reduced lower pacing rate limit. He has been skipping diuretics to allow him to pursue daily activities outside the home (still teaches art classes). Discussed sodium dietary restriction, daily weight monitoring, the concept of "dry weight" and diuretic dose adjustment. Switch to PO diuretics today, possible DC in AM>He will need early follow up.  2. AFib: although he rarely has NSR, we switched to VVI pacing to reduce the need for V pacing and allow hysteresis down to 50 bpm. Rate controlled/slow. Has not associated hemodynamic changes or symptoms in the past. May decrease beta blocker dose if rate still slow when he becomes more active. Consider BiV upgrade if LVEF falls. He had amiodarone related hyperthyroidism, now resolved (normal TSH late Sept).  3. AS s/p TAVR: recent echo with normal gradients and mild AI. 4. S/P MVR: large diameter prosthesis (29 mm) with good gradients. 5. CAD s/p CABG S/P June 2016 percutaneous coronary angioplasty - Xience DES 3.0 mm x 8 mm prox LAD : no angina. If EF falls, may need repeat  cath.  6. PPM: adjusted programming as above. 7. Restrictive lung disease due to very large diaphragmatic hernia: lowers threshold for decompensation. Also with moderate thoracic kyphosis. 8. HTN: controlled. 9. Sacral pressure ulcer: very early/mild, protective measures in place  Signed, Sanda Klein, MD  09/23/2016, 8:34 AM

## 2016-09-24 ENCOUNTER — Other Ambulatory Visit: Payer: Self-pay | Admitting: Physician Assistant

## 2016-09-24 DIAGNOSIS — I481 Persistent atrial fibrillation: Secondary | ICD-10-CM

## 2016-09-24 DIAGNOSIS — I5033 Acute on chronic diastolic (congestive) heart failure: Secondary | ICD-10-CM

## 2016-09-24 DIAGNOSIS — Z952 Presence of prosthetic heart valve: Secondary | ICD-10-CM

## 2016-09-24 DIAGNOSIS — I5032 Chronic diastolic (congestive) heart failure: Secondary | ICD-10-CM

## 2016-09-24 LAB — BASIC METABOLIC PANEL
Anion gap: 6 (ref 5–15)
BUN: 28 mg/dL — ABNORMAL HIGH (ref 6–20)
CALCIUM: 8.4 mg/dL — AB (ref 8.9–10.3)
CO2: 32 mmol/L (ref 22–32)
CREATININE: 0.91 mg/dL (ref 0.61–1.24)
Chloride: 103 mmol/L (ref 101–111)
GFR calc non Af Amer: >60 mL/min (ref >60)
Glucose, Bld: 115 mg/dL — ABNORMAL HIGH (ref 65–99)
Potassium: 4.1 mmol/L (ref 3.5–5.1)
SODIUM: 141 mmol/L (ref 135–145)

## 2016-09-24 MED ORDER — METOPROLOL SUCCINATE ER 50 MG PO TB24: ORAL_TABLET | ORAL | 0 refills | Status: DC

## 2016-09-24 MED ORDER — FUROSEMIDE 80 MG PO TABS: 80.0000 mg | ORAL_TABLET | Freq: Every day | ORAL | 11 refills | Status: DC

## 2016-09-24 NOTE — Discharge Summary (Signed)
Discharge Summary    Patient ID: [name] [name],  MRN: [id number], DOB/AGE: 80-Feb-1930 80 y.o.  Admit date: 09/21/2016 Discharge date: 09/24/2016  Primary Care Provider: [name] Primary Cardiologist: Dr. Dani Gobble [name]   Discharge Diagnoses    Principal Problem:   Acute on chronic diastolic CHF (congestive heart failure) ([place]) Active Problems:   Restrictive lung disease   Pacemaker - Medtronic Adapta, dual chamber   S/P mitral valve replacement with bioprosthetic valve   SSS (sick sinus syndrome) (HCC)   S/P TAVR (transcatheter aortic valve replacement)   Permanent atrial fibrillation (HCC)   Pressure ulcer of sacral region, stage 1   Allergies No Known Allergies  Diagnostic Studies/Procedures    None  _____________   History of Present Illness     [name] [name] is a 82 y.o. male with a history of CAD status post CABG and mitral regurgitation status post mitral valve replacement and Maze procedure by Dr. Amador Cunas [facility] in 8/09, PCI in 5/15 with DES to LAD, permanent atrial fibrillation, aortic stenosis status post TAVR in [id number], diastolic CHF, sick sinus syndrome status post pacemaker, large diaphragmatic hernia. He is not on long-term anticoagulation secondary to history of GI bleeding. 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 Echo in 6/17 demonstrated EF 55% with a well-seated aortic valve prosthesis with mild perivalvular regurgitation and a well-seated mitral valve prosthesis with normal function. He was started on Lasix in 10/17 secondary to volume excess. Device check at that time was notable for 80% ventricular pacing over the previous 6 months. He presented to the hospital on 09/21/16 with progressively worsening dyspnea in the setting of stopping his Lasix for 3-4 days. Chest x-ray demonstrated bilateral effusions with atelectasis versus pneumonia. He was given 1 dose of IV Lasix and admitted for further evaluation and management of congestive  heart failure.   Hospital Course     Consultants: none   Patient was admitted with acute on chronic diastolic CHF. He was continued on IV Lasix for diuresis. He does have a lower threshold for decompensation with restrictive lung disease related to his very large diaphragmatic hernia. He also has moderate thoracic kyphosis. Patient has a history of reduced LV function when he is primarily ventricular pacing. His pacemaker was adjusted to VVI pacing to reduce the need for ventricular pacing. He will need consideration for CRT if he has reduced LV function in the future. He had good diuresis and was switched to oral diuretics on 09/23/16. He diuresed -2.3 L this admission. Admit weight was 172. Discharge weight 163. His creatinine remained stable with diuresis. He was evaluated by Dr. Caryl Comes this morning. He was noted to have slow ventricular response at times in atrial fibrillation. Therefore, the plan is to discontinue his beta blocker. He has been asked to take his metoprolol every other day for 10 days and stop. Dr. Caryl Comes felt that he was stable for discharge to home. He will need follow-up in the next 1 week for CHF and follow-up on his heart rate. _____________  Discharge Vitals Blood pressure (!) 141/76, pulse (!) 54, temperature 97.4 F (36.3 C), temperature source Oral, resp. rate (!) 22, height 5\' 10"  (1.778 m), weight 163 lb 9.3 oz (74.2 kg), SpO2 96 %.  Filed Weights   09/22/16 0500 09/23/16 0300 09/24/16 0411  Weight: 171 lb 1.2 oz (77.6 kg) 162 lb 0.6 oz (73.5 kg) 163 lb 9.3 oz (74.2 kg)    Labs & Radiologic Studies  CBC  Recent Labs  09/21/16 1528  WBC 7.3  HGB 12.5*  HCT 39.4  MCV 92.7  PLT Q000111Q*   Basic Metabolic Panel  Recent Labs  09/23/16 0250 09/24/16 0239  NA 142 141  K 4.0 4.1  CL 102 103  CO2 32 32  GLUCOSE 96 115*  BUN 23* 28*  CREATININE 0.95 0.91  CALCIUM 8.7* 8.4*   _____________  Dg Chest 2 View   Result Date: 09/21/2016 IMPRESSION: Overall  deterioration in the appearance of the chest since July 2017. CHF with mild interstitial edema. Bilateral pleural effusions greatest on the left. Bibasilar atelectasis or pneumonia greatest on the left. Large hiatal hernia. Thoracic aortic atherosclerosis. Electronically Signed   By: David  Martinique M.D.   On: 09/21/2016 10:02   Disposition   Pt is being discharged home today in good condition.  Follow-up Plans & Appointments    Follow-up Information    Sanda Klein, MD. Call in 1 week(s).   Specialty:  Cardiology Why:  The office will call to arrange an appointment with Dr. [name] or a PA or NP in 1 week Contact information: [address] [place] [address] [phone]          Discharge Instructions    ([place]) Call MD:  Anytime you have any of the following symptoms: 1) 3 pound weight gain in 24 hours or 5 pounds in 1 week 2) shortness of breath, with or without a dry hacking cough 3) swelling in the hands, feet or stomach 4) if you have to sleep on extra pillows at night in order to breathe.    Complete by:  As directed    Diet - low sodium heart healthy    Complete by:  As directed    Increase activity slowly    Complete by:  As directed       Discharge Medications   Current Discharge Medication List    CONTINUE these medications which have CHANGED   Details  furosemide (LASIX) 80 MG tablet Take 1 tablet (80 mg total) by mouth daily. Qty: 30 tablet, Refills: 11    metoprolol succinate (TOPROL-XL) 50 MG 24 hr tablet Over the next 10 days, take 1 pill every other day, then STOP Qty: 1 tablet, Refills: 0      CONTINUE these medications which have NOT CHANGED   Details  clopidogrel (PLAVIX) 75 MG tablet Take 1 tablet (75 mg total) by mouth daily with breakfast. Qty: 90 tablet, Refills: 3    methimazole (TAPAZOLE) 5 MG tablet TAKE 1 TABLET (5 MG TOTAL) BY MOUTH EVERY MONDAY, WEDNESDAY, AND FRIDAY. Qty: 36 tablet, Refills: 2      Multiple Vitamin (MULTIVITAMIN WITH MINERALS) TABS Take 1 tablet by mouth daily. Centrum Silver    pantoprazole (PROTONIX) 40 MG tablet Take 1 tablet (40 mg total) by mouth daily. Qty: 30 tablet, Refills: 5   Associated Diagnoses: Barrett's esophagus without dysplasia; Gastroesophageal reflux disease without esophagitis    potassium chloride SA (KLOR-CON M20) 20 MEQ tablet Take 1 tablet (20 mEq total) by mouth daily. Qty: 14 tablet, Refills: 0    simvastatin (ZOCOR) 40 MG tablet Take 1 tablet (40 mg total) by mouth at bedtime. Qty: 90 tablet, Refills: 2    Tamsulosin HCl (FLOMAX) 0.4 MG CAPS Take 0.4 mg by mouth every evening.            Outstanding Labs/Studies   BMET at follow up visit.  Duration of Discharge Encounter  Greater than 30 minutes including physician time.  Signed, Richardson Dopp, PA-C  09/24/2016, 2:32 PM

## 2016-09-24 NOTE — Progress Notes (Signed)
Patient Name: [name] [name]      SUBJECTIVE: 82 yo with acute diastolic heart failure exacerbation, at least in part due to limited compliance with diuretics, in the setting of CAD s/p CABG, AS s/p TAVR with mild perivalvular leak, bioprosthetic MVR, long term persistent AF with dual chamber permanent pacemaker and enormous diaphragmatic hernia.  He is on aspirin and Plavix but not on anticoagulation because of "GI bleeding complications".  Prior history of amiodarone discontinued because of thyrotoxicosis  Yesterday diuretics changed IV--by mouth.  Shortness of breath much better.  Most recent ejection fraction is 60%.  Past Medical History:  Diagnosis Date  . ANEMIA, IRON DEFICIENCY 10/13/2008  . ANXIETY 09/04/2008  . Aortic valvular disorder 08/16/2012   echo EF 45-50% LV mildly reduce,Biocor mitral valve,mild to mod. tricuspid regrug.,mild to mod. aortic regrug.  . Aortic valvular stenosis 11/19/2013  . ATRIAL FIBRILLATION, CHRONIC 01/14/2008   on warfarin since 2007  . BARRETTS ESOPHAGUS 03/20/2009  . BASAL CELL CARCINOMA OF SKIN SITE UNSPECIFIED 09/28/2010  . Cardiomyopathy, ischemic 11/19/2013   EF 45-50% with inferior wall hypokinesis by echo 2014  . CATARACTS, BILATERAL, HX OF 01/14/2008  . CEREBROVASCULAR ACCIDENT, HX OF 08/18/2008  . CHF (congestive heart failure) ([place])   . COAGULOPATHY, COUMADIN-INDUCED 01/14/2008  . COLONIC POLYPS, ADENOMATOUS 01/14/2008  . CORONARY ARTERY DISEASE 06/01/2007   Cath 04/03/2008; Cath 11/01/1990  . Dysrhythmia    HX OF [place]  . Edema, peripheral 07/30/2009   LEV doppler- no thrombus or thrombophlebitis  . Elevated PSA   . GASTRIC POLYP 05/13/2009  . GERD 06/01/2007  . Hyperglycemia   . HYPERLIPIDEMIA 06/01/2007  . HYPERTENSION 06/01/2007  . HYPERTHYROIDISM 01/06/2011  . Mitral regurgitation 06/13/2008   mitral valve replacement #20 St. Jude Biocor; Maze procedure by Dr. Amador Cunas  [facility]  . Mitral stenosis    Porcine  bioprosthetic tissue valve placed 06/13/2008  . Mitral valve disorder 09/08/2011   echo EF 50-55% LV normal  . Pacemaker   . Peripheral artery disease ([place]) 06/16/2010   LEA doppler-left ABI nml 0.61 calcified vessels;right ABI  0.68  . PERSONAL HX COLONIC POLYPS 01/01/2010  . PLEURAL EFFUSION, LEFT 10/13/2008  . PROSTATE CANCER, HX OF 01/14/2008  . Prosthetic valve dysfunction    Mitral stenosis involving bioprosthetic tissue valve placed 06/13/2008  . Restless leg syndrome   . S/P CABG x 1 06/13/2008   SVG to LAD with open vein harvest right thigh, LIMA harvested but not utilized by Dr Amador Cunas  . S/P Maze operation for atrial fibrillation 06/13/2008   Partial left atrial lesion set using cryothermy for bilateral pulmonary vein isolation by Dr Amador Cunas  . S/P mitral valve replacement with bioprosthetic valve 06/13/2008   63mm St Jude Biocor porcine bioprosthetic tissue valve by Dr Amador Cunas  . 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 Tachy-brady syndrome ([place]) 07/13/2008   medtronic Adapta gen. model ADDR01 ,serial # [id number] H by Dr  [name] at [facility]  . UNSPECIFIED VENOUS INSUFFICIENCY 09/18/2008    Scheduled Meds:  Scheduled Meds: . aspirin EC  81 mg Oral Daily  . clopidogrel  75 mg Oral Q breakfast  . furosemide  80 mg Oral Daily  . heparin  5,000 Units Subcutaneous Q8H  . mouth rinse  15 mL Mouth Rinse BID  . methimazole  5 mg Oral Q M,W,F  .  metoprolol succinate  50 mg Oral Daily  . pantoprazole  40 mg Oral Daily  . potassium chloride SA  20 mEq Oral Daily  . simvastatin  40 mg Oral QHS  . sodium chloride flush  3 mL Intravenous Q12H  . tamsulosin  0.4 mg Oral QPM   Continuous Infusions: sodium chloride, acetaminophen, sodium chloride flush    PHYSICAL EXAM Vitals:   09/23/16 1956 09/23/16 2336 09/24/16 0411 09/24/16 0810  BP:  124/69 130/65 (!) 145/77   Pulse: (!) 35 (!) 51 (!) 48 (!) 54  Resp: 20 17 18  (!) 23  Temp: 97.8 F (36.6 C) 97.5 F (36.4 C) 97.4 F (36.3 C) 97.4 F (36.3 C)  TempSrc: Oral Oral Oral Oral  SpO2: 92% 97% 96% 97%  Weight:   163 lb 9.3 oz (74.2 kg)   Height:        Well developed and nourished in no acute distress HENT normal Neck supple with JVP-f8 cmt Clear ronchi L  Regular rate and rhythm, no murmurs or gallops Abd-soft with active BS No Clubbing cyanosis tr edema Skin-warm and dry A & Oriented  Grossly normal sensory and motor function   Telemetry Personally reviewed  atrial flutter with intermittent ventricular pacing adequate heart rate control with mean heart rates in the 50s.    Intake/Output Summary (Last 24 hours) at 09/24/16 1146 Last data filed at 09/23/16 2100  Gross per 24 hour  Intake              240 ml  Output              450 ml  Net             -210 ml    LABS: Basic Metabolic Panel:  Recent Labs Lab 09/21/16 0919 09/21/16 1528 09/22/16 0217 09/23/16 0250 09/24/16 0239  NA 144  --  144 142 141  K 3.7  --  4.1 4.0 4.1  CL 110  --  108 102 103  CO2 29  --  30 32 32  GLUCOSE 125*  --  96 96 115*  BUN 23*  --  19 23* 28*  CREATININE 0.76 0.84 0.83 0.95 0.91  CALCIUM 8.8*  --  8.6* 8.7* 8.4*   Cardiac Enzymes: No results for input(s): CKTOTAL, CKMB, CKMBINDEX, TROPONINI in the last 72 hours. CBC:  Recent Labs Lab 09/21/16 0919 09/21/16 1528  WBC 7.3 7.3  NEUTROABS 5.6  --   HGB 11.9* 12.5*  HCT 37.0* 39.4  MCV 94.9 92.7  PLT 135* 147*   PROTIME: No results for input(s): LABPROT, INR in the last 72 hours. Liver Function Tests: No results for input(s): AST, ALT, ALKPHOS, BILITOT, PROT, ALBUMIN in the last 72 hours. No results for input(s): LIPASE, AMYLASE in the last 72 hours. BNP: BNP (last 3 results)  Recent Labs  08/23/16 1155 09/21/16 0919  BNP 237.5* 481.6*   2    ASSESSMENT AND PLAN:  Principal Problem:   Acute on chronic diastolic  CHF (congestive heart failure) (HCC) Active Problems:   Restrictive lung disease   Pacemaker - Medtronic Adapta, dual chamber   S/P mitral valve replacement with bioprosthetic valve   SSS (sick sinus syndrome) (HCC)   S/P TAVR (transcatheter aortic valve replacement)   Persistent atrial fibrillation (HCC)   CHF (congestive heart failure) (HCC)   Pressure ulcer of sacral region, stage 1  With atrial fibrillation and a slow ventricular response, I will discontinue the metoprolol  He  takes it daily. He says the pill was too small to cut and affect. I've asked him to take 1 pill qod We'll discharge on by mouth furosemide Will need early follow-up for assessment of heart failure and heart rate. I would encourage reassessment of bleeding risks I am not sure per guidelines that he continues to need to be on dual antiplatelet therapy now 2 years post TAVR. Looking at guidelines today, albeit a not valve specific, it 6 months of dual therapy.   Signed, Virl Axe MD  09/24/2016

## 2016-09-24 NOTE — Discharge Instructions (Signed)
Take the Metoprolol Succinate (Toprol-XL) every other day over the next 10 days and then STOP TAKING IT.  Weigh daily.  If your weight increase 3 lbs in 1 day or 5 lbs in 1 week, call our office.  You will get a blood test at your next office visit (BMET) to recheck your potassium and kidney function.

## 2016-09-26 ENCOUNTER — Telehealth: Payer: Self-pay | Admitting: Cardiovascular Disease

## 2016-09-26 NOTE — Telephone Encounter (Signed)
Spoke with pt went over medications and verified that all is correct and he is taking them as directed. All questions answered.

## 2016-09-26 NOTE — Telephone Encounter (Signed)
Please call,pt is confused about his medicine. They want to come in and bring his medicine and get it straight on how and when he takes his medicine please.

## 2016-09-27 ENCOUNTER — Telehealth: Payer: Self-pay | Admitting: Cardiovascular Disease

## 2016-09-27 NOTE — Telephone Encounter (Signed)
Spoke with patient states he is doing well No questions regarding medications, [name] [name] reviewed with patient yesterday Confirmed appointment for next week

## 2016-09-27 NOTE — Telephone Encounter (Signed)
TOC Phone Call... Appt is on 10/05/16 at 9:30 am w/ [name].... Thanks

## 2016-10-05 ENCOUNTER — Ambulatory Visit (INDEPENDENT_AMBULATORY_CARE_PROVIDER_SITE_OTHER): Payer: Medicare Other | Admitting: Cardiology

## 2016-10-05 ENCOUNTER — Encounter: Payer: Self-pay | Admitting: Cardiology

## 2016-10-05 VITALS — BP 124/82 | HR 65 | Ht 70.0 in | Wt 160.8 lb

## 2016-10-05 DIAGNOSIS — I5032 Chronic diastolic (congestive) heart failure: Secondary | ICD-10-CM | POA: Diagnosis not present

## 2016-10-05 DIAGNOSIS — Z952 Presence of prosthetic heart valve: Secondary | ICD-10-CM

## 2016-10-05 DIAGNOSIS — I482 Chronic atrial fibrillation: Secondary | ICD-10-CM

## 2016-10-05 DIAGNOSIS — Z8679 Personal history of other diseases of the circulatory system: Secondary | ICD-10-CM

## 2016-10-05 DIAGNOSIS — I050 Rheumatic mitral stenosis: Secondary | ICD-10-CM

## 2016-10-05 DIAGNOSIS — I4821 Permanent atrial fibrillation: Secondary | ICD-10-CM

## 2016-10-05 DIAGNOSIS — Z9889 Other specified postprocedural states: Secondary | ICD-10-CM

## 2016-10-05 DIAGNOSIS — Z950 Presence of cardiac pacemaker: Secondary | ICD-10-CM | POA: Diagnosis not present

## 2016-10-05 DIAGNOSIS — Z951 Presence of aortocoronary bypass graft: Secondary | ICD-10-CM

## 2016-10-05 MED ORDER — FUROSEMIDE 40 MG PO TABS
ORAL_TABLET | ORAL | 1 refills | Status: DC
Start: 2016-10-05 — End: 2017-04-25

## 2016-10-05 NOTE — Assessment & Plan Note (Signed)
Pacemaker dependent , 100% pacing in the atrium and ventricle

## 2016-10-05 NOTE — Assessment & Plan Note (Signed)
Porcine bioprosthetic tissue valve placed 06/13/2008

## 2016-10-05 NOTE — Assessment & Plan Note (Signed)
29 mm Edwards Sapien XT transcatheter heart valve placed via open right transfemoral approach

## 2016-10-05 NOTE — Assessment & Plan Note (Signed)
Xience DES 3.0 mm x 8 mm prox LAD May 2015 

## 2016-10-05 NOTE — Progress Notes (Signed)
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 The pt had MVR with CABG x 1 at [place] in 2009. He had an LAD DES in May 2015 followed by TAVR in June 2015. He has persistent atrial fibrillation and SSS. He is s/p MDT pacemaker and is pacer dependant. He is not anticoagulated secondary to a history of GI bleeding. He had Amiodarone induced hyperthyroidism. He has had some problems with diastolic CHF. His last EF was 55% by echo June 2017. He saw Dr [name] in the office 08/16/16 and was placed on daily Lasix and K+ for suspected volume overload. 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   On 09/21/16 he presented to the ED with increased dyspnea. He admitted he hadn't taken his Lasix for 3-4 days. His admission wgt was 172- discharge wgt 162. He is in the office today as a TOC follow up. He feels good. Wgt is 160. He didn't take his Lasix yesterday because he went to teach at the church.    Current Outpatient Prescriptions  Medication Sig Dispense Refill  . clopidogrel (PLAVIX) 75 MG tablet Take 1 tablet (75 mg total) by mouth daily with breakfast. 90 tablet 3  . methimazole (TAPAZOLE) 5 MG tablet TAKE 1 TABLET (5 MG TOTAL) BY MOUTH EVERY MONDAY, WEDNESDAY, AND FRIDAY. 36 tablet 2  . metoprolol succinate (TOPROL-XL) 50 MG 24 hr tablet Over the next 10 days, take 1 pill every other day, then STOP 1 tablet 0  . Multiple Vitamin (MULTIVITAMIN WITH MINERALS) TABS Take 1 tablet by mouth daily. Centrum Silver    . pantoprazole (PROTONIX) 40 MG tablet Take 1 tablet (40 mg total) by mouth daily. 30 tablet 5  . simvastatin (ZOCOR) 40 MG tablet Take 1 tablet (40 mg  total) by mouth at bedtime. 90 tablet 2  . Tamsulosin HCl (FLOMAX) 0.4 MG CAPS Take 0.4 mg by mouth every evening.     . furosemide (LASIX) 40 MG tablet Take 40mg  daily EXCEPT every Mon, Wed, Fri take 80mg  daily 180 tablet 1  . potassium chloride SA (KLOR-CON M20) 20 MEQ tablet Take 1 tablet (20 mEq total) by mouth daily. 14 tablet 0   No current facility-administered medications for this visit.     No Known Allergies  Social History   Social History  . Marital status: Married    Spouse name: N/A  . Number of children: 4  . Years of education: N/A   Occupational History  . Retired Retired   Social History Main Topics  . Smoking status: Former Smoker    Years: 2.00    Types: Pipe    Quit date: 11/07/1965  . Smokeless tobacco: Never Used     Comment: quit over 40 years ago, never smoked cigarettes  . Alcohol use No  . Drug use: No  . Sexual activity: Not on file   Other Topics Concern  . Not on file   Social History Narrative  . No narrative on file     Review of Systems: General: negative for chills, fever, night sweats or weight changes.  Cardiovascular: negative for chest pain,  dyspnea on exertion, edema, orthopnea, palpitations, paroxysmal nocturnal dyspnea or shortness of breath Dermatological: negative for rash Respiratory: negative for cough or wheezing Urologic: negative for hematuria Abdominal: negative for nausea, vomiting, diarrhea, bright red blood per rectum, melena, or hematemesis Neurologic: negative for visual changes, syncope, or dizziness All other systems reviewed and are otherwise negative except as noted above.    Blood pressure 124/82, pulse 65, height 5\' 10"  (1.778 m), weight 160 lb 12.8 oz (72.9 kg).  General appearance: alert, cooperative and no distress Significant kyphosis Neck: no carotid bruit and no JVD Lungs: decreased Lt base Heart: irregularly irregular rhythm Extremities: chronic venous skin changes Skin: Skin color, texture,  turgor normal. No rashes or lesions Neurologic: Grossly normal  EKG AF with VR 65, inferior lateral TWI  ASSESSMENT AND PLAN:   Acute on chronic diastolic CHF (congestive heart failure) (HCC) Pt seen in the office as a TOC f/u after recent admission. 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  CAD S/P percutaneous coronary angioplasty -  Xience DES 3.0 mm x 8 mm prox LAD May [id number]  Chronic diastolic heart failure (HCC) EF 55% by echo June 2017  Mitral stenosis Porcine bioprosthetic tissue valve placed 06/13/2008  Pacemaker - Medtronic Adapta, dual chamber Pacemaker dependent , 100% pacing in the atrium and ventricle  Permanent atrial fibrillation ([place]) Not on Coumadin secondary to GI bleeding. Beta blocker stopped secondary to slow AF rate  S/P CABG x 1 SVG to LAD with open vein harvest via right thigh, LIMA harvested but not utilized by Dr Amador Cunas  S/P Maze operation for atrial fibrillation Partial left atrial lesion set using cryothermy for bilateral pulmonary vein isolation by Dr Amador Cunas  S/P TAVR (transcatheter aortic valve replacement) 29 mm Edwards Sapien XT transcatheter heart valve placed via open right transfemoral approach   PLAN  His BUN at discharge was 28. I cut his Lasix back to 80 mg MWF and 40 mg other days. He'll follow up with Dr C in 4-6 weeks.   [name] PA-C 10/05/2016 10:23 AM

## 2016-10-05 NOTE — Patient Instructions (Signed)
Medication Instructions:  Your physician has recommended you make the following change in your medication:  REDUCE Lasix to 40mg  daily EXCEPT Mon, Wed, Fri take 80mg  daily. An Rx has been sent to your mail order pharmacy   Labwork: None ordered  Testing/Procedures: None ordered  Follow-Up: Your physician recommends that you schedule a follow-up appointment in: 4-6 weeks with Dr.[name]   Any Other Special Instructions Will Be Listed Below (If Applicable).     If you need a refill on your cardiac medications before your next appointment, please call your pharmacy.

## 2016-10-05 NOTE — Assessment & Plan Note (Signed)
Not on Coumadin secondary to GI bleeding. Beta blocker stopped secondary to slow AF rate

## 2016-10-05 NOTE — Assessment & Plan Note (Signed)
Partial left atrial lesion set using cryothermy for bilateral pulmonary vein isolation by Dr Amador Cunas

## 2016-10-05 NOTE — Assessment & Plan Note (Signed)
Pt seen in the office as a TOC f/u after recent admission. 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

## 2016-10-05 NOTE — Assessment & Plan Note (Signed)
EF 55% by echo June 2017

## 2016-10-05 NOTE — Assessment & Plan Note (Signed)
SVG to LAD with open vein harvest via right thigh, LIMA harvested but not utilized by Dr Amador Cunas

## 2016-10-19 ENCOUNTER — Encounter: Payer: Medicare Other | Admitting: Cardiovascular Disease

## 2016-11-08 ENCOUNTER — Other Ambulatory Visit: Payer: Self-pay

## 2016-11-09 ENCOUNTER — Encounter: Payer: Self-pay | Admitting: Podiatry

## 2016-11-09 ENCOUNTER — Other Ambulatory Visit: Payer: Self-pay

## 2016-11-09 ENCOUNTER — Ambulatory Visit (INDEPENDENT_AMBULATORY_CARE_PROVIDER_SITE_OTHER): Payer: Medicare Other | Admitting: Podiatry

## 2016-11-09 VITALS — BP 169/85 | HR 109 | Resp 18

## 2016-11-09 DIAGNOSIS — K2270 Barrett's esophagus without dysplasia: Secondary | ICD-10-CM

## 2016-11-09 DIAGNOSIS — M79674 Pain in right toe(s): Secondary | ICD-10-CM | POA: Diagnosis not present

## 2016-11-09 DIAGNOSIS — B351 Tinea unguium: Secondary | ICD-10-CM | POA: Diagnosis not present

## 2016-11-09 DIAGNOSIS — M79675 Pain in left toe(s): Secondary | ICD-10-CM

## 2016-11-09 DIAGNOSIS — K219 Gastro-esophageal reflux disease without esophagitis: Secondary | ICD-10-CM

## 2016-11-09 MED ORDER — PANTOPRAZOLE SODIUM 40 MG PO TBEC: 40.0000 mg | DELAYED_RELEASE_TABLET | Freq: Every day | ORAL | 2 refills | Status: AC

## 2016-11-09 NOTE — Patient Instructions (Signed)
There was a small amount of bleeding after trimming her toenails in the second left toe. Remove the Band-Aid and 1-3 days and continue to apply topical antibiotic ointment and a Band-Aid daily until a scab forms

## 2016-11-10 NOTE — Progress Notes (Signed)
Patient ID: [name] [name], male   DOB: [birth year], 83 y.o.   MRN: [id number]    Subjective: This patient presents today complaining of painful toenails walking wearing shoes and is requesting nail debridement. Also, patient is complaining of a painful nucleated plantar lesion on the left foot  Objective: DP and PT pulses 2/4 bilaterally Sensation to 10 g monofilament wire intact 0/5 bilaterally Vibratory sensation nonreactive bilaterally Ankle reflexes equal and reactive bilaterally Stasis pigmentation lower extremity bilaterally HAV left Manual motor testing dorsi flexion, plantar flexion 5/5 bilaterally No open skin lesions bilaterally The toenails are elongated, hypertrophic, discolored, brittle and tender direct palpation 6-10 Nucleated keratoses plantar left heel Small punctate keratoses right heel  Assessment: Peripheral neuropathy Symptomatic onychomycoses 6-10 Porokeratosis 1  Plan: Debrided toenails 10 mechanically and electrically with slight bleeding distal second left toe. Apply topical antibiotic ointment and Band-Aid. Patient instructed removed Band-Aid 1-3 days and continue to apply topical antibiotic ointment daily until a scab forms Debridement porokeratosis plantar minimal buildup today  Reappoint 3 months

## 2016-11-11 ENCOUNTER — Telehealth: Payer: Self-pay | Admitting: *Deleted

## 2016-11-11 NOTE — Telephone Encounter (Signed)
Pt states he received a call about an appt 11/14/2016 and he just had an appt 11/09/2016, and was released for 3 months. Please call and clarify.

## 2016-11-18 ENCOUNTER — Ambulatory Visit (INDEPENDENT_AMBULATORY_CARE_PROVIDER_SITE_OTHER): Payer: Medicare Other | Admitting: Cardiovascular Disease

## 2016-11-18 ENCOUNTER — Ambulatory Visit: Payer: Medicare Other | Admitting: Cardiovascular Disease

## 2016-11-18 ENCOUNTER — Encounter: Payer: Self-pay | Admitting: Cardiovascular Disease

## 2016-11-18 VITALS — BP 146/80 | HR 67 | Ht 70.0 in | Wt 164.0 lb

## 2016-11-18 DIAGNOSIS — I5032 Chronic diastolic (congestive) heart failure: Secondary | ICD-10-CM

## 2016-11-18 DIAGNOSIS — I4821 Permanent atrial fibrillation: Secondary | ICD-10-CM

## 2016-11-18 DIAGNOSIS — Z953 Presence of xenogenic heart valve: Secondary | ICD-10-CM | POA: Diagnosis not present

## 2016-11-18 DIAGNOSIS — I482 Chronic atrial fibrillation: Secondary | ICD-10-CM

## 2016-11-18 DIAGNOSIS — E785 Hyperlipidemia, unspecified: Secondary | ICD-10-CM

## 2016-11-18 DIAGNOSIS — I2510 Atherosclerotic heart disease of native coronary artery without angina pectoris: Secondary | ICD-10-CM

## 2016-11-18 DIAGNOSIS — I10 Essential (primary) hypertension: Secondary | ICD-10-CM

## 2016-11-18 DIAGNOSIS — Z952 Presence of prosthetic heart valve: Secondary | ICD-10-CM

## 2016-11-18 DIAGNOSIS — Z9181 History of falling: Secondary | ICD-10-CM

## 2016-11-18 DIAGNOSIS — Z950 Presence of cardiac pacemaker: Secondary | ICD-10-CM | POA: Diagnosis not present

## 2016-11-18 DIAGNOSIS — E0580 Other thyrotoxicosis without thyrotoxic crisis or storm: Secondary | ICD-10-CM

## 2016-11-18 DIAGNOSIS — T462X5A Adverse effect of other antidysrhythmic drugs, initial encounter: Secondary | ICD-10-CM

## 2016-11-18 DIAGNOSIS — T8203XD Leakage of heart valve prosthesis, subsequent encounter: Secondary | ICD-10-CM

## 2016-11-18 DIAGNOSIS — K449 Diaphragmatic hernia without obstruction or gangrene: Secondary | ICD-10-CM

## 2016-11-18 DIAGNOSIS — Z9861 Coronary angioplasty status: Secondary | ICD-10-CM

## 2016-11-18 NOTE — Progress Notes (Signed)
Patient ID: [name] [name], male   DOB: [birth year], 83 y.o.   MRN: [id number]    Cardiology Office Note    Date:  11/18/2016   ID:  [name] [name], DOB 09/26/1929, MRN [id number]  PCP:  [name], MD  Cardiologist:   Sanda Klein, MD   Chief Complaint  Patient presents with  . Follow-up    6 weeks; Pt states no Sx.     History of Present Illness:  [name] [name] is a 83 y.o. male with coronary, valvular and arrhythmic cardiac problems, here for follow-up. He was hospitalized in November with acute heart failure partly related to interruption of diuretics, possibly related to increased frequency of ventricular pacing.  Feels much better. He is able to lie completely supine in bed. He does not have leg edema. He is back to his previous NYHA functional class II status as far as dyspnea. He also has severe kyphoscoliosis and a very large diaphragmatic hernia that contribute to his shortness of breath.  After adjustment in his medications and pacemaker settings, ventricular pacing has decreased to only 25% (versus 80%). Interrogation of his pacemaker today (Medtronic Adapta 2009) shows that it has 26 months of expected longevity (range 7-44 months). His dual-chamber device is now programmed VVI. Although his ventricular rate sometimes increased to the 100-110s, there has been very little in the way of high ventricular rates, never over 150.  He has not had any falls since his last appointment but is very careful with position changes. He does not like taking the higher doses of furosemide of 80 mg due to the severe and prolonged diuresis.  His most recent echocardiogram from June 2017 shows normal left ventricular systolic function with ejection fraction of around 55%. There is mild perivalvular (TAVR) aortic insufficiency and the aortic valve prosthetic gradients are normal (mean grad 8 mm Hg), as are the mitral valve bioprosthetic gradients. The left atrium is severely dilated.  His  amiodarone was stopped about 15 months ago since he had persistent atrial fibrillation on the drug, had hyperthyroidism and had virtually 100% ventricular pacing.Marland Kitchen   His cardiac problems date back to 2009 when he was diagnosed with severe mitral insufficiency related to mitral valve prolapse. He was also having problems with paroxysmal atrial fibrillation. Coronary angiography demonstrated moderate coronary artery disease primarily a 70% calcified eccentric proximal LAD lesion with moderate stenoses in the other 2 major coronary arteries. He was referred for robotic mitral valve repair by Dr. Amador Cunas at [facility]. The LIMA was found to be a very small vessel. He received a saphenous vein graft bypass to the LAD. Cryoablation was performed for atrial fibrillation. The mitral valve was replaced with a 29 mm St. Jude Biocor prosthesis. The postoperative course was complicated by an embolic stroke from which she has recovered without sequelae. He received a dual-chamber permanent pacemaker (Medtronic adapta) and was pacemaker dependent with 100% pacing in both the atrium and the ventricle, though he has subsequently regained AV conduction at least partly. Subsequently, he has developed progressive CAD and aortic valve disease and underwent rotational atherectomy and stenting of the proximal LAD on 03/24/2014 (3 x 8 mm Xience Alpine drug-eluting stent) and then TAVR (29 mm Edwards Sapien) on April 22, 2014.  Left ventricular systolic function was mildly depressed with an EF of 45-50% primarily due to RV pacing induced asynchrony, but follow-up echo in May 2016 suggested EF dropped to 35-40 percent. The most recent echocardiogram in November 2016 shows normal left  ventricular ejection fraction of 60%. This last echo showed that the aortic valve insufficiency was only trivial. 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 This prevents imaging of his heart from transesophageal  views  He has mild hyperthyroidism that is well controlled on low dose of methimazole. 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 He has gastroesophageal reflux disease and Barrett's esophagus. He has extensive sigmoid and transverse colonic diverticulosis. One month following aortic stent valve replacement he had diverticular bleeding requiring angiographic coil embolization.   Past Medical History:  Diagnosis Date  . ANEMIA, IRON DEFICIENCY 10/13/2008  . ANXIETY 09/04/2008  . Aortic valvular disorder 08/16/2012   echo EF 45-50% LV mildly reduce,Biocor mitral valve,mild to mod. tricuspid regrug.,mild to mod. aortic regrug.  . Aortic valvular stenosis 11/19/2013  . ATRIAL FIBRILLATION, CHRONIC 01/14/2008   on warfarin since 2007  . BARRETTS ESOPHAGUS 03/20/2009  . BASAL CELL CARCINOMA OF SKIN SITE UNSPECIFIED 09/28/2010  . Cardiomyopathy, ischemic 11/19/2013   EF 45-50% with inferior wall hypokinesis by echo 2014  . CATARACTS, BILATERAL, HX OF 01/14/2008  . CEREBROVASCULAR ACCIDENT, HX OF 08/18/2008  . CHF (congestive heart failure) ([place])   . COAGULOPATHY, COUMADIN-INDUCED 01/14/2008  . COLONIC POLYPS, ADENOMATOUS 01/14/2008  . CORONARY ARTERY DISEASE 06/01/2007   Cath 04/03/2008; Cath 11/01/1990  . Dysrhythmia    HX OF [place]  . Edema, peripheral 07/30/2009   LEV doppler- no thrombus or thrombophlebitis  . Elevated PSA   . GASTRIC POLYP 05/13/2009  . GERD 06/01/2007  . Hyperglycemia   . HYPERLIPIDEMIA 06/01/2007  . HYPERTENSION 06/01/2007  . HYPERTHYROIDISM 01/06/2011  . Mitral regurgitation 06/13/2008   mitral valve replacement #20 St. Jude Biocor; Maze procedure by Dr. Amador Cunas  at [facility]  . Mitral stenosis    Porcine bioprosthetic tissue valve placed 06/13/2008  . Mitral valve disorder 09/08/2011   echo EF 50-55% LV normal  . Pacemaker   . Peripheral artery disease ([place]) 06/16/2010   LEA doppler-left ABI nml 0.61 calcified vessels;right ABI  0.68  . PERSONAL HX COLONIC POLYPS 01/01/2010  .  PLEURAL EFFUSION, LEFT 10/13/2008  . PROSTATE CANCER, HX OF 01/14/2008  . Prosthetic valve dysfunction    Mitral stenosis involving bioprosthetic tissue valve placed 06/13/2008  . Restless leg syndrome   . S/P CABG x 1 06/13/2008   SVG to LAD with open vein harvest right thigh, LIMA harvested but not utilized by Dr Amador Cunas  . S/P Maze operation for atrial fibrillation 06/13/2008   Partial left atrial lesion set using cryothermy for bilateral pulmonary vein isolation by Dr Amador Cunas  . S/P mitral valve replacement with bioprosthetic valve 06/13/2008   21mm St Jude Biocor porcine bioprosthetic tissue valve by Dr Amador Cunas  . 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 Tachy-brady syndrome ([place]) 07/13/2008   medtronic Adapta gen. model ADDR01 ,serial # [id number] by Dr  [name] at [facility]  . UNSPECIFIED VENOUS INSUFFICIENCY 09/18/2008    Past Surgical History:  Procedure Laterality Date  . CARDIAC CATHETERIZATION  11/01/1990  . CARDIOVASCULAR STRESS TEST  06/03/2010   Persantine perfusion EF 45% mild to moderate ischemia basal and mid inferolateral region  . CARDIOVASCULAR STRESS TEST  01/01/2008   left ventricle normal,no significant ischemia  . CARDIOVASCULAR STRESS TEST  09/09/2005    possibility of ischemia inferior wall  . COLONOSCOPY  07/14/2009  diverticulosis, internal hemorrhoids  . CORONARY ARTERY BYPASS GRAFT  06/13/2008   graft x1 w/vein graft LAD artery by Dr. Amador [facility]as at [facility]  . CORONARY STENT PLACEMENT  03/24/2014   DES to LAD  . CYSTECTOMY     Left leg  . INTRAOPERATIVE TRANSESOPHAGEAL ECHOCARDIOGRAM N/A 04/22/2014   Procedure: INTRAOPERATIVE TRANSTHORACIC ECHOCARDIOGRAM;  Surgeon: [name], MD;  Location: [place];  Service: Open Heart Surgery;  Laterality: N/A;  . LEFT AND RIGHT HEART CATHETERIZATION WITH CORONARY ANGIOGRAM N/A 01/08/2014   Procedure: LEFT AND  RIGHT HEART CATHETERIZATION WITH CORONARY ANGIOGRAM;  Surgeon: Sanda Klein, MD;  Location: [place] CATH LAB;  Service: Cardiovascular;  Laterality: N/A;  . MITRAL VALVE REPLACEMENT  06/13/2008   #20 St. Jude Bicor mitral valve ;Maze procedure at [facility] by Dr. Amador Cunas  . PACEMAKER PLACEMENT  07/13/2008   [place] dual chamber-Medtronic at [facility]   . PERCUTANEOUS CORONARY ROTOBLATOR INTERVENTION (PCI-R) N/A 03/24/2014   Procedure: PERCUTANEOUS CORONARY ROTOBLATOR INTERVENTION (PCI-R);  Surgeon: Blane Ohara, MD;  Location: [facility] CATH LAB;  Service: Cardiovascular;  Laterality: N/A;  . PROSTATECTOMY    . SKIN CANCER EXCISION     removed from forehead and right leg, nose/face  . TEE WITHOUT CARDIOVERSION N/A 01/22/2014   Procedure: TRANSESOPHAGEAL ECHOCARDIOGRAM (TEE);  Surgeon: Sanda Klein, MD;  Location: [facility] ENDOSCOPY;  Service: Cardiovascular;  Laterality: N/A;  . TONSILLECTOMY    . TRANSCATHETER AORTIC VALVE REPLACEMENT, TRANSFEMORAL N/A 04/22/2014   Procedure: TRANSCATHETER AORTIC VALVE REPLACEMENT, TRANSFEMORAL;  Surgeon: [name], MD;  Location: [place];  Service: Open Heart Surgery;  Laterality: N/A;  TAVR-TF (RIGHT SIDE)  . UPPER GASTROINTESTINAL ENDOSCOPY  07/14/2009   erosive reflux esopagitis, Barrett's esophagus    Current Medications: Outpatient Medications Prior to Visit  Medication Sig Dispense Refill  . clopidogrel (PLAVIX) 75 MG tablet Take 1 tablet (75 mg total) by mouth daily with breakfast. 90 tablet 3  . furosemide (LASIX) 40 MG tablet Take 40mg  daily EXCEPT every Mon, Wed, Fri take 80mg  daily 180 tablet 1  . methimazole (TAPAZOLE) 5 MG tablet TAKE 1 TABLET (5 MG TOTAL) BY MOUTH EVERY MONDAY, WEDNESDAY, AND FRIDAY. 36 tablet 2  . metoprolol succinate (TOPROL-XL) 50 MG 24 hr tablet Over the next 10 days, take 1 pill every other day, then STOP 1 tablet 0  . Multiple Vitamin (MULTIVITAMIN WITH MINERALS) TABS Take 1 tablet by mouth daily. Centrum Silver    . pantoprazole (PROTONIX)  40 MG tablet Take 1 tablet (40 mg total) by mouth daily. 90 tablet 2  . simvastatin (ZOCOR) 40 MG tablet Take 1 tablet (40 mg total) by mouth at bedtime. 90 tablet 2  . Tamsulosin HCl (FLOMAX) 0.4 MG CAPS Take 0.4 mg by mouth every evening.     . potassium chloride SA (KLOR-CON M20) 20 MEQ tablet Take 1 tablet (20 mEq total) by mouth daily. 14 tablet 0   No facility-administered medications prior to visit.      Allergies:   Patient has no known allergies.   Social History   Social History  . Marital status: Married    Spouse name: N/A  . Number of children: 4  . Years of education: N/A   Occupational History  . Retired Retired   Social History Main Topics  . Smoking status: Former Smoker    Years: 2.00    Types: Pipe    Quit date: 11/07/1965  . Smokeless tobacco: Never Used     Comment: quit over 40 years ago, never  smoked cigarettes  . Alcohol use No  . Drug use: No  . Sexual activity: Not Asked   Other Topics Concern  . None   Social History Narrative  . None     Family History:  The patient's family history includes Breast cancer in his mother; Stroke in his mother; Transient ischemic attack in his mother.   ROS:   Please see the history of present illness.    ROS All other systems reviewed and are negative.   PHYSICAL EXAM:   VS:  BP (!) 146/80   Pulse 67   Ht 5\' 10"  (1.778 m)   Wt 74.4 kg (164 lb)   BMI 23.53 kg/m    GEN: Well nourished, well developed, in no acute distress, Slightly tachypnea (respiratory rate 22) HEENT: normal  Neck: no carotid bruits, or masses. Jugular venous pulsations are mildly elevated at about 5cm and there are prominent V waves Cardiac: Paradoxically split S2,irregular rhythm;  grade 1-2/6 aortic ejection murmur is early peaking, there is a 2/6 crescendo diastolic murmur heard especially well at the left lower sternal border, no rubs, or gallops,no edema ; Healthy subclavian pacemaker site  Respiratory:  clear to auscultation  bilaterally, normal work of breathing. Very prominent thoracic kyphosis GI: soft, nontender, nondistended, + BS MS: no deformity or atrophy  Skin: warm and dry, no rash Neuro:  Alert and Oriented x 3, Strength and sensation are intact Psych: euthymic mood, full affect  Wt Readings from Last 3 Encounters:  11/18/16 74.4 kg (164 lb)  10/05/16 72.9 kg (160 lb 12.8 oz)  09/24/16 74.2 kg (163 lb 9.3 oz)      Studies/Labs Reviewed:   EKG:  EKG is not ordered today.   Recent Labs: 08/04/2016: TSH 1.52 09/21/2016: B Natriuretic Peptide 481.6; Hemoglobin 12.5; Platelets 147 09/24/2016: BUN 28; Creatinine, Ser 0.91; Potassium 4.1; Sodium 141   Lipid Panel    Component Value Date/Time   CHOL 120 03/14/2016 1605   TRIG 48.0 03/14/2016 1605   HDL 42.60 03/14/2016 1605   CHOLHDL 3 03/14/2016 1605   VLDL 9.6 03/14/2016 1605   LDLCALC 68 03/14/2016 1605      ASSESSMENT:    1. Chronic diastolic heart failure ([place])   2. Permanent atrial fibrillation ([place])   3. Pacemaker   4. S/P mitral valve replacement with bioprosthetic valve   5. S/P TAVR (transcatheter aortic valve replacement)   6. Perivalvular leak of prosthetic heart valve, subsequent encounter   7. CAD S/P percutaneous coronary angioplasty -    8. Diaphragmatic hernia without obstruction and without gangrene   9. Essential hypertension   10. Dyslipidemia   11. 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 Risk for falls      PLAN:  In order of problems listed above:  1. Chronic diastolic heart failure -  He feels much better stores his breathing but does not like taking the diuretic. His physical exam suggest that he is borderline hypervolemic, but he is able to lie completely flat and does not have overt heart failure. Reviewed the importance of daily weight monitoring and sodium restriction. The goals of diuretic therapy and how he should adjust the furosemide dose for his symptoms and weight. Target a weight no higher than  today's weight of 164 pounds on our office scale. 2. Persistent atrial fibrillation  He is not on anticoagulation due to GI bleeding complications. There is no clear evidence that the atrial fibrillation is having significant impact on his symptoms. Amiodarone  stopped due to thyrotoxicosis. No plan for other antiarrhythmic therapy. Rate control is acceptable. Possible that ventricular pacing had some role in his heart failure decompensation. Avoid excessive rate control. 3. Normal function of permanent pacemaker, programmed VVI ; Continue remote checks every 3 months 4. Status post mitral valve biological prosthesis with normal function 5. Status post TAVR with normal gradients 6. Mild valvular aortic insufficiency by most recent echocardiogram, not clinically important at this time. Murmur is quite obvious, but not really changed from previous exam. Reevaluate next June. 7. CAD status post drug-eluting stent to proximal LAD artery June 2016. Plan to continue the clopidogrel indefinitely, unless bleeding becomes a more serious problem 8. Very large diaphragmatic hernia with gastric and colon contents in the thoracic cavity. Together with his prominent thoracic kyphosis I believe these are contributing in large measure to his dyspnea. 9. Hypertension, mildly elevated today, usually well controlled  10. Hyperlipidemia with excellent LDL cholesterol on statin 11. Hyperthyroidism, possibly amiodarone related, has been euvolemic for a year now. Latest labs from Sept 28 showed normal TSH. He is still taking methimazole 3 days a week. 12. Falls: Couple of serious falls earlier this year, thankfully none recently.   Medication Adjustments/Labs and Tests Ordered: Current medicines are reviewed at length with the patient today.  Concerns regarding medicines are outlined above.  Medication changes, Labs and Tests ordered today are listed in the Patient Instructions below. Patient Instructions  Your physician  recommends that you return for lab work in 3 months. Please have this done at least 1 week prior to your appointment.  Dr [name] recommends that you schedule a follow-up appointment in 3 months with a pacemaker check.  If you need a refill on your cardiac medications before your next appointment, please call your pharmacy.    Signed, Sanda Klein, MD  11/18/2016 7:21 PM    [address], [place], [place]  [address] Phone: [phone]; Fax: [phone]

## 2016-11-18 NOTE — Patient Instructions (Addendum)
Your physician recommends that you return for lab work in 3 months. Please have this done at least 1 week prior to your appointment.  Dr [name] recommends that you schedule a follow-up appointment in 3 months with a pacemaker check.  If you need a refill on your cardiac medications before your next appointment, please call your pharmacy.

## 2016-12-09 ENCOUNTER — Other Ambulatory Visit: Payer: Self-pay | Admitting: Cardiovascular Disease

## 2016-12-14 ENCOUNTER — Other Ambulatory Visit: Payer: Self-pay | Admitting: Cardiovascular Disease

## 2016-12-15 LAB — CUP PACEART INCLINIC DEVICE CHECK
Battery Voltage: 2.73 V
Implantable Lead Location: 753859
Implantable Lead Location: 753860
Implantable Lead Model: 5076
Implantable Pulse Generator Implant Date: 20090906
Lead Channel Impedance Value: 698 Ohm
Lead Channel Pacing Threshold Amplitude: 0.75 V
Lead Channel Pacing Threshold Pulse Width: 0.4 ms
Lead Channel Setting Pacing Amplitude: 2.5 V
MDC IDC LEAD IMPLANT DT: 20090906
MDC IDC LEAD IMPLANT DT: 20090906
MDC IDC MSMT BATTERY IMPEDANCE: 2988 Ohm
MDC IDC MSMT BATTERY REMAINING LONGEVITY: 26 mo
MDC IDC MSMT LEADCHNL RA IMPEDANCE VALUE: 67 Ohm
MDC IDC SESS DTM: 20180112141803
MDC IDC SET LEADCHNL RV PACING PULSEWIDTH: 0.4 ms
MDC IDC SET LEADCHNL RV SENSING SENSITIVITY: 2.8 mV
MDC IDC STAT BRADY RV PERCENT PACED: 24 %

## 2016-12-16 ENCOUNTER — Ambulatory Visit (INDEPENDENT_AMBULATORY_CARE_PROVIDER_SITE_OTHER): Payer: Medicare Other | Admitting: Endocrinology

## 2016-12-16 DIAGNOSIS — I509 Heart failure, unspecified: Secondary | ICD-10-CM

## 2016-12-16 NOTE — Progress Notes (Signed)
Subjective:    Patient ID: [name] [name], male    DOB: 1929/09/10, 83 y.o.   MRN: [id number]  HPI  Pt returns for f/u of CHF.  states he often skips the lasix, because he does not like the diuretic effect.  He has ongoing chronic doe.   Past Medical History:  Diagnosis Date  . ANEMIA, IRON DEFICIENCY 10/13/2008  . ANXIETY 09/04/2008  . Aortic valvular disorder 08/16/2012   echo EF 45-50% LV mildly reduce,Biocor mitral valve,mild to mod. tricuspid regrug.,mild to mod. aortic regrug.  . Aortic valvular stenosis 11/19/2013  . ATRIAL FIBRILLATION, CHRONIC 01/14/2008   on warfarin since 2007  . BARRETTS ESOPHAGUS 03/20/2009  . BASAL CELL CARCINOMA OF SKIN SITE UNSPECIFIED 09/28/2010  . Cardiomyopathy, ischemic 11/19/2013   EF 45-50% with inferior wall hypokinesis by echo 2014  . CATARACTS, BILATERAL, HX OF 01/14/2008  . CEREBROVASCULAR ACCIDENT, HX OF 08/18/2008  . CHF (congestive heart failure) ([place])   . COAGULOPATHY, COUMADIN-INDUCED 01/14/2008  . COLONIC POLYPS, ADENOMATOUS 01/14/2008  . CORONARY ARTERY DISEASE 06/01/2007   Cath 04/03/2008; Cath 11/01/1990  . Dysrhythmia    HX OF TACHY BRADY  . Edema, peripheral 07/30/2009   LEV doppler- no thrombus or thrombophlebitis  . Elevated PSA   . GASTRIC POLYP 05/13/2009  . GERD 06/01/2007  . Hyperglycemia   . HYPERLIPIDEMIA 06/01/2007  . HYPERTENSION 06/01/2007  . HYPERTHYROIDISM 01/06/2011  . Mitral regurgitation 06/13/2008   mitral valve replacement #20 St. Jude Biocor; Maze procedure by Dr. Amador Cunas  at [facility]  . Mitral stenosis    Porcine bioprosthetic tissue valve placed 06/13/2008  . Mitral valve disorder 09/08/2011   echo EF 50-55% LV normal  . Pacemaker   . Peripheral artery disease ([place]) 06/16/2010   LEA doppler-left ABI nml 0.61 calcified vessels;right ABI  0.68  . PERSONAL HX COLONIC POLYPS 01/01/2010  . PLEURAL EFFUSION, LEFT 10/13/2008  . PROSTATE CANCER, HX OF 01/14/2008  . Prosthetic valve dysfunction    Mitral stenosis involving  bioprosthetic tissue valve placed 06/13/2008  . Restless leg syndrome   . S/P CABG x 1 06/13/2008   SVG to LAD with open vein harvest right thigh, LIMA harvested but not utilized by Dr Amador Cunas  . S/P Maze operation for atrial fibrillation 06/13/2008   Partial left atrial lesion set using cryothermy for bilateral pulmonary vein isolation by Dr Amador Cunas  . S/P mitral valve replacement with bioprosthetic valve 06/13/2008   79mm St Jude Biocor porcine bioprosthetic tissue valve by Dr Amador Cunas  . 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 Tachy-brady syndrome ([place]) 07/13/2008   medtronic Adapta gen. model ADDR01 ,serial # [id number] H by Dr  [name] at [facility]  . UNSPECIFIED VENOUS INSUFFICIENCY 09/18/2008    Past Surgical History:  Procedure Laterality Date  . CARDIAC CATHETERIZATION  11/01/1990  . CARDIOVASCULAR STRESS TEST  06/03/2010   Persantine perfusion EF 45% mild to moderate ischemia basal and mid inferolateral region  . CARDIOVASCULAR STRESS TEST  01/01/2008   left ventricle normal,no significant ischemia  . CARDIOVASCULAR STRESS TEST  09/09/2005    possibility of ischemia inferior wall  . COLONOSCOPY  07/14/2009   diverticulosis, internal hemorrhoids  . CORONARY ARTERY BYPASS GRAFT  06/13/2008   graft x1 w/vein graft LAD artery by Dr. Amador [facility]unas at [facility]  . CORONARY STENT PLACEMENT  03/24/2014   DES to LAD  . CYSTECTOMY  Left leg  . INTRAOPERATIVE TRANSESOPHAGEAL ECHOCARDIOGRAM N/A 04/22/2014   Procedure: INTRAOPERATIVE TRANSTHORACIC ECHOCARDIOGRAM;  Surgeon: [name], MD;  Location: [place];  Service: Open Heart Surgery;  Laterality: N/A;  . LEFT AND RIGHT HEART CATHETERIZATION WITH CORONARY ANGIOGRAM N/A 01/08/2014   Procedure: LEFT AND RIGHT HEART CATHETERIZATION WITH CORONARY ANGIOGRAM;  Surgeon: Sanda Klein, MD;  Location: [place] CATH LAB;  Service:  Cardiovascular;  Laterality: N/A;  . MITRAL VALVE REPLACEMENT  06/13/2008   #20 St. Jude Bicor mitral valve ;Maze procedure at [facility] by Dr. Amador Cunas  . PACEMAKER PLACEMENT  07/13/2008   Adapata dual chamber-Medtronic at [facility]   . PERCUTANEOUS CORONARY ROTOBLATOR INTERVENTION (PCI-R) N/A 03/24/2014   Procedure: PERCUTANEOUS CORONARY ROTOBLATOR INTERVENTION (PCI-R);  Surgeon: Blane Ohara, MD;  Location: [facility] CATH LAB;  Service: Cardiovascular;  Laterality: N/A;  . PROSTATECTOMY    . SKIN CANCER EXCISION     removed from forehead and right leg, nose/face  . TEE WITHOUT CARDIOVERSION N/A 01/22/2014   Procedure: TRANSESOPHAGEAL ECHOCARDIOGRAM (TEE);  Surgeon: Sanda Klein, MD;  Location: [facility] ENDOSCOPY;  Service: Cardiovascular;  Laterality: N/A;  . TONSILLECTOMY    . TRANSCATHETER AORTIC VALVE REPLACEMENT, TRANSFEMORAL N/A 04/22/2014   Procedure: TRANSCATHETER AORTIC VALVE REPLACEMENT, TRANSFEMORAL;  Surgeon: [name], MD;  Location: [place];  Service: Open Heart Surgery;  Laterality: N/A;  TAVR-TF (RIGHT SIDE)  . UPPER GASTROINTESTINAL ENDOSCOPY  07/14/2009   erosive reflux esopagitis, Barrett's esophagus    Social History   Social History  . Marital status: Married    Spouse name: N/A  . Number of children: 4  . Years of education: N/A   Occupational History  . Retired Retired   Social History Main Topics  . Smoking status: Former Smoker    Years: 2.00    Types: Pipe    Quit date: 11/07/1965  . Smokeless tobacco: Never Used     Comment: quit over 40 years ago, never smoked cigarettes  . Alcohol use No  . Drug use: No  . Sexual activity: Not on file   Other Topics Concern  . Not on file   Social History Narrative  . No narrative on file    No current facility-administered medications on file prior to visit.    Current Outpatient Prescriptions on File Prior to Visit  Medication Sig Dispense Refill  . clopidogrel (PLAVIX) 75 MG tablet Take 1 tablet (75 mg total) by mouth  daily with breakfast. 90 tablet 3  . furosemide (LASIX) 40 MG tablet Take 40mg  daily EXCEPT every Mon, Wed, Fri take 80mg  daily 180 tablet 1  . methimazole (TAPAZOLE) 5 MG tablet TAKE 1 TABLET (5 MG TOTAL) BY MOUTH EVERY MONDAY, WEDNESDAY, AND FRIDAY. 36 tablet 2  . metoprolol succinate (TOPROL-XL) 50 MG 24 hr tablet Over the next 10 days, take 1 pill every other day, then STOP (Patient taking differently: Take 50 mg by mouth daily. ) 1 tablet 0  . Multiple Vitamin (MULTIVITAMIN WITH MINERALS) TABS Take 1 tablet by mouth daily. Centrum Silver    . pantoprazole (PROTONIX) 40 MG tablet Take 1 tablet (40 mg total) by mouth daily. (Patient taking differently: Take 40 mg by mouth 2 (two) times daily. ) 90 tablet 2  . potassium chloride SA (KLOR-CON M20) 20 MEQ tablet Take 1 tablet (20 mEq total) by mouth daily. 14 tablet 0  . simvastatin (ZOCOR) 40 MG tablet Take 1 tablet (40 mg total) by mouth at bedtime. 90 tablet 2  . Tamsulosin HCl (FLOMAX)  0.4 MG CAPS Take 0.4 mg by mouth every evening.       No Known Allergies  Family History  Problem Relation Age of Onset  . Breast cancer Mother   . Stroke Mother   . Transient ischemic attack Mother     BP 122/70   Pulse (!) 102   Temp 98.1 F (36.7 C)   Wt 160 lb (72.6 kg)   SpO2 94%   BMI 22.96 kg/m    Review of Systems  He has slight cough.    Objective:   Physical Exam VITAL SIGNS:  See vs page GENERAL: no distress LUNGS:  Clear to auscultation, except for a few rales at the bases     Assessment & Plan:  CHF: therapy limited by noncompliance.   Patient is advised the following: Patient Instructions  It is really important to take the furosemide, as this keeps you out of the hospital.

## 2016-12-16 NOTE — Patient Instructions (Addendum)
It is really important to take the furosemide, as this keeps you out of the hospital.

## 2016-12-17 ENCOUNTER — Inpatient Hospital Stay (HOSPITAL_COMMUNITY)
Admission: EM | Admit: 2016-12-17 | Discharge: 2016-12-21 | DRG: 193 | Disposition: A | Discharge status: Home / Self Care | Payer: Medicare Other | Attending: Internal Medicine | Admitting: Internal Medicine

## 2016-12-17 ENCOUNTER — Encounter (HOSPITAL_COMMUNITY): Payer: Self-pay | Admitting: Emergency Medicine

## 2016-12-17 ENCOUNTER — Emergency Department (HOSPITAL_COMMUNITY): Payer: Medicare Other

## 2016-12-17 DIAGNOSIS — Z951 Presence of aortocoronary bypass graft: Secondary | ICD-10-CM

## 2016-12-17 DIAGNOSIS — J9601 Acute respiratory failure with hypoxia: Secondary | ICD-10-CM | POA: Diagnosis present

## 2016-12-17 DIAGNOSIS — D509 Iron deficiency anemia, unspecified: Secondary | ICD-10-CM | POA: Diagnosis present

## 2016-12-17 DIAGNOSIS — I509 Heart failure, unspecified: Secondary | ICD-10-CM | POA: Insufficient documentation

## 2016-12-17 DIAGNOSIS — T462X5A Adverse effect of other antidysrhythmic drugs, initial encounter: Secondary | ICD-10-CM | POA: Diagnosis present

## 2016-12-17 DIAGNOSIS — Z950 Presence of cardiac pacemaker: Secondary | ICD-10-CM | POA: Diagnosis not present

## 2016-12-17 DIAGNOSIS — Z8546 Personal history of malignant neoplasm of prostate: Secondary | ICD-10-CM

## 2016-12-17 DIAGNOSIS — Z955 Presence of coronary angioplasty implant and graft: Secondary | ICD-10-CM

## 2016-12-17 DIAGNOSIS — R042 Hemoptysis: Secondary | ICD-10-CM | POA: Diagnosis present

## 2016-12-17 DIAGNOSIS — I2510 Atherosclerotic heart disease of native coronary artery without angina pectoris: Secondary | ICD-10-CM | POA: Diagnosis present

## 2016-12-17 DIAGNOSIS — R7989 Other specified abnormal findings of blood chemistry: Secondary | ICD-10-CM | POA: Diagnosis present

## 2016-12-17 DIAGNOSIS — R0600 Dyspnea, unspecified: Secondary | ICD-10-CM | POA: Diagnosis not present

## 2016-12-17 DIAGNOSIS — I5032 Chronic diastolic (congestive) heart failure: Secondary | ICD-10-CM | POA: Diagnosis present

## 2016-12-17 DIAGNOSIS — I248 Other forms of acute ischemic heart disease: Secondary | ICD-10-CM | POA: Diagnosis present

## 2016-12-17 DIAGNOSIS — Z66 Do not resuscitate: Secondary | ICD-10-CM | POA: Diagnosis present

## 2016-12-17 DIAGNOSIS — Z823 Family history of stroke: Secondary | ICD-10-CM | POA: Diagnosis not present

## 2016-12-17 DIAGNOSIS — R0989 Other specified symptoms and signs involving the circulatory and respiratory systems: Secondary | ICD-10-CM

## 2016-12-17 DIAGNOSIS — Z8673 Personal history of transient ischemic attack (TIA), and cerebral infarction without residual deficits: Secondary | ICD-10-CM

## 2016-12-17 DIAGNOSIS — J189 Pneumonia, unspecified organism: Principal | ICD-10-CM | POA: Diagnosis present

## 2016-12-17 DIAGNOSIS — J181 Lobar pneumonia, unspecified organism: Secondary | ICD-10-CM | POA: Diagnosis not present

## 2016-12-17 DIAGNOSIS — I4821 Permanent atrial fibrillation: Secondary | ICD-10-CM | POA: Diagnosis present

## 2016-12-17 DIAGNOSIS — Z953 Presence of xenogenic heart valve: Secondary | ICD-10-CM | POA: Diagnosis not present

## 2016-12-17 DIAGNOSIS — K219 Gastro-esophageal reflux disease without esophagitis: Secondary | ICD-10-CM | POA: Diagnosis present

## 2016-12-17 DIAGNOSIS — Z85828 Personal history of other malignant neoplasm of skin: Secondary | ICD-10-CM

## 2016-12-17 DIAGNOSIS — R0602 Shortness of breath: Secondary | ICD-10-CM | POA: Diagnosis present

## 2016-12-17 DIAGNOSIS — Y929 Unspecified place or not applicable: Secondary | ICD-10-CM | POA: Diagnosis not present

## 2016-12-17 DIAGNOSIS — R748 Abnormal levels of other serum enzymes: Secondary | ICD-10-CM | POA: Diagnosis not present

## 2016-12-17 DIAGNOSIS — Z7902 Long term (current) use of antithrombotics/antiplatelets: Secondary | ICD-10-CM

## 2016-12-17 DIAGNOSIS — R778 Other specified abnormalities of plasma proteins: Secondary | ICD-10-CM | POA: Diagnosis present

## 2016-12-17 DIAGNOSIS — I5033 Acute on chronic diastolic (congestive) heart failure: Secondary | ICD-10-CM | POA: Diagnosis present

## 2016-12-17 DIAGNOSIS — I482 Chronic atrial fibrillation: Secondary | ICD-10-CM | POA: Diagnosis present

## 2016-12-17 DIAGNOSIS — J984 Other disorders of lung: Secondary | ICD-10-CM | POA: Diagnosis present

## 2016-12-17 DIAGNOSIS — Z9114 Patient's other noncompliance with medication regimen: Secondary | ICD-10-CM

## 2016-12-17 DIAGNOSIS — E0590 Thyrotoxicosis, unspecified without thyrotoxic crisis or storm: Secondary | ICD-10-CM | POA: Diagnosis present

## 2016-12-17 DIAGNOSIS — E0580 Other thyrotoxicosis without thyrotoxic crisis or storm: Secondary | ICD-10-CM | POA: Diagnosis present

## 2016-12-17 DIAGNOSIS — Z87891 Personal history of nicotine dependence: Secondary | ICD-10-CM

## 2016-12-17 DIAGNOSIS — I110 Hypertensive heart disease with heart failure: Secondary | ICD-10-CM | POA: Diagnosis present

## 2016-12-17 DIAGNOSIS — Z86010 Personal history of colonic polyps: Secondary | ICD-10-CM

## 2016-12-17 DIAGNOSIS — Z803 Family history of malignant neoplasm of breast: Secondary | ICD-10-CM

## 2016-12-17 DIAGNOSIS — Z79899 Other long term (current) drug therapy: Secondary | ICD-10-CM

## 2016-12-17 DIAGNOSIS — E785 Hyperlipidemia, unspecified: Secondary | ICD-10-CM | POA: Diagnosis present

## 2016-12-17 LAB — CBC
HEMATOCRIT: 38.9 % — AB (ref 39.0–52.0)
Hemoglobin: 12.5 g/dL — ABNORMAL LOW (ref 13.0–17.0)
MCH: 30.4 pg (ref 26.0–34.0)
MCHC: 32.1 g/dL (ref 30.0–36.0)
MCV: 94.6 fL (ref 78.0–100.0)
PLATELETS: 126 10*3/uL — AB (ref 150–400)
RBC: 4.11 MIL/uL — ABNORMAL LOW (ref 4.22–5.81)
RDW: 14.2 % (ref 11.5–15.5)
WBC: 5.6 10*3/uL (ref 4.0–10.5)

## 2016-12-17 LAB — BASIC METABOLIC PANEL
Anion gap: 9 (ref 5–15)
BUN: 29 mg/dL — AB (ref 6–20)
CHLORIDE: 108 mmol/L (ref 101–111)
CO2: 28 mmol/L (ref 22–32)
CREATININE: 0.88 mg/dL (ref 0.61–1.24)
Calcium: 8.8 mg/dL — ABNORMAL LOW (ref 8.9–10.3)
Glucose, Bld: 107 mg/dL — ABNORMAL HIGH (ref 65–99)
POTASSIUM: 4 mmol/L (ref 3.5–5.1)
SODIUM: 145 mmol/L (ref 135–145)

## 2016-12-17 LAB — CBC WITH DIFFERENTIAL/PLATELET
BASOS PCT: 0 %
Basophils Absolute: 0 10*3/uL (ref 0.0–0.1)
EOS ABS: 0 10*3/uL (ref 0.0–0.7)
EOS PCT: 0 %
HCT: 38.6 % — ABNORMAL LOW (ref 39.0–52.0)
HEMOGLOBIN: 12.2 g/dL — AB (ref 13.0–17.0)
Lymphocytes Relative: 13 %
Lymphs Abs: 0.8 10*3/uL (ref 0.7–4.0)
MCH: 30 pg (ref 26.0–34.0)
MCHC: 31.6 g/dL (ref 30.0–36.0)
MCV: 95.1 fL (ref 78.0–100.0)
Monocytes Absolute: 0.5 10*3/uL (ref 0.1–1.0)
Monocytes Relative: 7 %
NEUTROS PCT: 80 %
Neutro Abs: 5.3 10*3/uL (ref 1.7–7.7)
PLATELETS: 128 10*3/uL — AB (ref 150–400)
RBC: 4.06 MIL/uL — AB (ref 4.22–5.81)
RDW: 14.4 % (ref 11.5–15.5)
WBC: 6.6 10*3/uL (ref 4.0–10.5)

## 2016-12-17 LAB — I-STAT TROPONIN, ED: Troponin i, poc: 0.27 ng/mL (ref 0.00–0.08)

## 2016-12-17 LAB — CREATININE, SERUM
Creatinine, Ser: 0.8 mg/dL (ref 0.61–1.24)
GFR calc Af Amer: >60 mL/min (ref >60)

## 2016-12-17 LAB — TROPONIN I
TROPONIN I: 0.21 ng/mL — AB (ref <0.03)
TROPONIN I: 0.33 ng/mL — AB (ref <0.03)

## 2016-12-17 LAB — BRAIN NATRIURETIC PEPTIDE: B Natriuretic Peptide: 392 pg/mL — ABNORMAL HIGH (ref 0.0–100.0)

## 2016-12-17 LAB — PROCALCITONIN: Procalcitonin: 0.21 ng/mL

## 2016-12-17 MED ORDER — ALBUTEROL SULFATE (2.5 MG/3ML) 0.083% IN NEBU: 2.5000 mg | INHALATION_SOLUTION | RESPIRATORY_TRACT | Status: DC | PRN

## 2016-12-17 MED ORDER — PANTOPRAZOLE SODIUM 40 MG PO TBEC
40.0000 mg | DELAYED_RELEASE_TABLET | Freq: Two times a day (BID) | ORAL | Status: DC
  Administered 2016-12-17 – 2016-12-21 (×8): 40 mg via ORAL
  Filled 2016-12-17 (×8): qty 1

## 2016-12-17 MED ORDER — METOPROLOL SUCCINATE ER 50 MG PO TB24
50.0000 mg | ORAL_TABLET | Freq: Every day | ORAL | Status: DC
  Administered 2016-12-18 – 2016-12-20 (×3): 50 mg via ORAL
  Filled 2016-12-17 (×4): qty 1

## 2016-12-17 MED ORDER — ACETAMINOPHEN 325 MG PO TABS: 650.0000 mg | ORAL_TABLET | Freq: Four times a day (QID) | ORAL | Status: DC | PRN

## 2016-12-17 MED ORDER — TAMSULOSIN HCL 0.4 MG PO CAPS
0.4000 mg | ORAL_CAPSULE | Freq: Every evening | ORAL | Status: DC
  Administered 2016-12-17 – 2016-12-20 (×4): 0.4 mg via ORAL
  Filled 2016-12-17 (×5): qty 1

## 2016-12-17 MED ORDER — FUROSEMIDE 20 MG PO TABS: 40.0000 mg | ORAL_TABLET | Freq: Every day | ORAL | Status: DC

## 2016-12-17 MED ORDER — METHIMAZOLE 5 MG PO TABS
5.0000 mg | ORAL_TABLET | ORAL | Status: DC
  Administered 2016-12-19 – 2016-12-21 (×2): 5 mg via ORAL
  Filled 2016-12-17 (×2): qty 1

## 2016-12-17 MED ORDER — DEXTROSE 5 % IV SOLN
1.0000 g | Freq: Once | INTRAVENOUS | Status: AC
  Administered 2016-12-17: 1 g via INTRAVENOUS
  Filled 2016-12-17: qty 10

## 2016-12-17 MED ORDER — ENOXAPARIN SODIUM 40 MG/0.4ML ~~LOC~~ SOLN
40.0000 mg | SUBCUTANEOUS | Status: DC
  Administered 2016-12-18 – 2016-12-20 (×3): 40 mg via SUBCUTANEOUS
  Filled 2016-12-17 (×4): qty 0.4

## 2016-12-17 MED ORDER — DEXTROSE 5 % IV SOLN
1.0000 g | INTRAVENOUS | Status: DC
  Administered 2016-12-18 – 2016-12-20 (×3): 1 g via INTRAVENOUS
  Filled 2016-12-17 (×3): qty 10

## 2016-12-17 MED ORDER — CLOPIDOGREL BISULFATE 75 MG PO TABS
75.0000 mg | ORAL_TABLET | Freq: Every day | ORAL | Status: DC
  Administered 2016-12-18 – 2016-12-21 (×4): 75 mg via ORAL
  Filled 2016-12-17 (×4): qty 1

## 2016-12-17 MED ORDER — FUROSEMIDE 10 MG/ML IJ SOLN
40.0000 mg | INTRAMUSCULAR | Status: AC
  Administered 2016-12-17: 40 mg via INTRAVENOUS
  Filled 2016-12-17: qty 4

## 2016-12-17 MED ORDER — FERROUS SULFATE 325 (65 FE) MG PO TABS
325.0000 mg | ORAL_TABLET | Freq: Every day | ORAL | Status: DC
  Administered 2016-12-18 – 2016-12-21 (×4): 325 mg via ORAL
  Filled 2016-12-17 (×4): qty 1

## 2016-12-17 MED ORDER — ONDANSETRON HCL 4 MG/2ML IJ SOLN: 4.0000 mg | Freq: Four times a day (QID) | INTRAMUSCULAR | Status: DC | PRN

## 2016-12-17 MED ORDER — SIMVASTATIN 40 MG PO TABS
40.0000 mg | ORAL_TABLET | Freq: Every day | ORAL | Status: DC
  Administered 2016-12-17 – 2016-12-20 (×4): 40 mg via ORAL
  Filled 2016-12-17 (×4): qty 1

## 2016-12-17 MED ORDER — ONDANSETRON HCL 4 MG PO TABS: 4.0000 mg | ORAL_TABLET | Freq: Four times a day (QID) | ORAL | Status: DC | PRN

## 2016-12-17 MED ORDER — ALBUTEROL SULFATE (2.5 MG/3ML) 0.083% IN NEBU
2.5000 mg | INHALATION_SOLUTION | Freq: Four times a day (QID) | RESPIRATORY_TRACT | Status: DC
  Administered 2016-12-17 – 2016-12-18 (×2): 2.5 mg via RESPIRATORY_TRACT
  Filled 2016-12-17 (×2): qty 3

## 2016-12-17 MED ORDER — DEXTROSE 5 % IV SOLN
500.0000 mg | INTRAVENOUS | Status: DC
  Administered 2016-12-18: 500 mg via INTRAVENOUS
  Filled 2016-12-17 (×2): qty 500

## 2016-12-17 MED ORDER — ACETAMINOPHEN 650 MG RE SUPP: 650.0000 mg | Freq: Four times a day (QID) | RECTAL | Status: DC | PRN

## 2016-12-17 MED ORDER — DEXTROSE 5 % IV SOLN
500.0000 mg | Freq: Once | INTRAVENOUS | Status: AC
  Administered 2016-12-17: 500 mg via INTRAVENOUS
  Filled 2016-12-17: qty 500

## 2016-12-17 MED ORDER — ASPIRIN 81 MG PO CHEW
324.0000 mg | CHEWABLE_TABLET | Freq: Once | ORAL | Status: AC
  Administered 2016-12-17: 324 mg via ORAL
  Filled 2016-12-17: qty 4

## 2016-12-17 NOTE — ED Provider Notes (Signed)
[place] DEPT Provider Note   CSN: [id number] Arrival date & time: 12/17/16  1132     History   Chief Complaint Chief Complaint  Patient presents with  . Cough    HPI [name] [name] is a 83 y.o. male.  The history is provided by the patient and medical records.  Cough  Associated symptoms include shortness of breath (exertional).    83 y.o. M with hx of anxiety, chronic AFIB not currently on anti-coagulation, hx CVA, GERD, HTN, HLP, hyperthyroidism, CAD s/p CABG, Fe+ deficiency anemia, CHF with estimated EF of 55%, presenting to the ED for cough.  Patient states he has had a wet, productive cough for about a week now.  States about 2 days ago he noticed a "pinkish tinge" to his sputum.  He does take plavix for stroke prevention.  States he has been getting SOB with exertion recently.  Denies fever or chills.  No nausea/vomiting.  Has been able to eat/drink normally.  He denies sick contacts.  He did receive flu shot this year and had pneumonia vaccine 2 years ago.  States he has been compliant with his medications including his fluid pills.  He is followed by cardiology, Dr. [name].  Past Medical History:  Diagnosis Date  . ANEMIA, IRON DEFICIENCY 10/13/2008  . ANXIETY 09/04/2008  . Aortic valvular disorder 08/16/2012   echo EF 45-50% LV mildly reduce,Biocor mitral valve,mild to mod. tricuspid regrug.,mild to mod. aortic regrug.  . Aortic valvular stenosis 11/19/2013  . ATRIAL FIBRILLATION, CHRONIC 01/14/2008   on warfarin since 2007  . BARRETTS ESOPHAGUS 03/20/2009  . BASAL CELL CARCINOMA OF SKIN SITE UNSPECIFIED 09/28/2010  . Cardiomyopathy, ischemic 11/19/2013   EF 45-50% with inferior wall hypokinesis by echo 2014  . CATARACTS, BILATERAL, HX OF 01/14/2008  . CEREBROVASCULAR ACCIDENT, HX OF 08/18/2008  . CHF (congestive heart failure) ([place])   . COAGULOPATHY, COUMADIN-INDUCED 01/14/2008  . COLONIC POLYPS, ADENOMATOUS 01/14/2008  . CORONARY ARTERY DISEASE 06/01/2007   Cath  04/03/2008; Cath 11/01/1990  . Dysrhythmia    HX OF TACHY BRADY  . Edema, peripheral 07/30/2009   LEV doppler- no thrombus or thrombophlebitis  . Elevated PSA   . GASTRIC POLYP 05/13/2009  . GERD 06/01/2007  . Hyperglycemia   . HYPERLIPIDEMIA 06/01/2007  . HYPERTENSION 06/01/2007  . HYPERTHYROIDISM 01/06/2011  . Mitral regurgitation 06/13/2008   mitral valve replacement #20 St. Jude Biocor; Maze procedure by Dr. Amador Cunas  at [facility]  . Mitral stenosis    Porcine bioprosthetic tissue valve placed 06/13/2008  . Mitral valve disorder 09/08/2011   echo EF 50-55% LV normal  . Pacemaker   . Peripheral artery disease ([place]) 06/16/2010   LEA doppler-left ABI nml 0.61 calcified vessels;right ABI  0.68  . PERSONAL HX COLONIC POLYPS 01/01/2010  . PLEURAL EFFUSION, LEFT 10/13/2008  . PROSTATE CANCER, HX OF 01/14/2008  . Prosthetic valve dysfunction    Mitral stenosis involving bioprosthetic tissue valve placed 06/13/2008  . Restless leg syndrome   . S/P CABG x 1 06/13/2008   SVG to LAD with open vein harvest right thigh, LIMA harvested but not utilized by Dr Amador Cunas  . S/P Maze operation for atrial fibrillation 06/13/2008   Partial left atrial lesion set using cryothermy for bilateral pulmonary vein isolation by Dr Amador Cunas  . S/P mitral valve replacement with bioprosthetic valve 06/13/2008   39mm St Jude Biocor porcine bioprosthetic tissue valve by Dr Amador Cunas  . S/P TAVR (transcatheter aortic valve replacement) 04/22/2014   29 mm Oletta Lamas  Sapien XT transcatheter heart valve placed via open right transfemoral approach  . Tachy-brady syndrome ([place]) 07/13/2008   medtronic Adapta gen. model ADDR01 ,serial # [id number] H by Dr  [name] at [facility]  . UNSPECIFIED VENOUS INSUFFICIENCY 09/18/2008    Patient Active Problem List   Diagnosis Date Noted  . Pressure ulcer of sacral region, stage 1 09/23/2016  . Acute on chronic diastolic CHF (congestive heart failure) ([place]) 09/21/2016  . Permanent atrial fibrillation  ([place]) 09/01/2016  . Chronic diastolic heart failure ([place]) 03/28/2016  . Diaphragmatic hernia 03/28/2016  . 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 Risk for falls 03/28/2016  . Paresthesia of left lower extremity 11/11/2015  . Hoarseness 12/23/2014  . GI bleed 05/17/2014  . Rectal bleed 05/17/2014  . Hypotension 05/17/2014  . 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 Dizziness 04/05/2014  . SSS (sick sinus syndrome) ([place]) 03/25/2014  . CAD S/P percutaneous coronary angioplasty -  03/24/2014  . Perivalvular leak of prosthetic heart valve   . Mitral stenosis   . Routine general medical examination at a health care facility 12/31/2013  . Solitary pulmonary nodule 12/08/2013  . Pleural effusion 12/05/2013  . Dyspnea 12/05/2013  . Pacemaker - Medtronic Adapta, dual chamber 11/19/2013  . Restrictive lung disease 03/30/2013  . Encounter for long-term (current) use of other medications 08/17/2012  . BASAL CELL CARCINOMA OF SKIN SITE UNSPECIFIED 09/28/2010  . PERSONAL HX COLONIC POLYPS 01/01/2010  . BARRETTS ESOPHAGUS 03/20/2009  . Hematuria 10/21/2008  . Iron deficiency anemia 10/13/2008  . UNSPECIFIED VENOUS INSUFFICIENCY 09/18/2008  . ANXIETY 09/04/2008  . Laceration of leg 09/04/2008  . CEREBROVASCULAR ACCIDENT, HX OF 08/18/2008  . S/P CABG x 1 06/13/2008  . S/P mitral valve replacement with bioprosthetic valve 06/13/2008  . S/P Maze operation for atrial fibrillation 06/13/2008  . PROSTATE CANCER, HX OF 01/14/2008  . CATARACTS, BILATERAL, HX OF 01/14/2008  . Dyslipidemia 06/01/2007  . Essential hypertension 06/01/2007  . GERD 06/01/2007    Past Surgical History:  Procedure Laterality Date  . CARDIAC CATHETERIZATION  11/01/1990  . CARDIOVASCULAR STRESS TEST  06/03/2010   Persantine perfusion EF 45% mild to moderate ischemia basal and mid inferolateral region  . CARDIOVASCULAR STRESS TEST  01/01/2008   left ventricle normal,no significant ischemia   . CARDIOVASCULAR STRESS TEST  09/09/2005    possibility of ischemia inferior wall  . COLONOSCOPY  07/14/2009   diverticulosis, internal hemorrhoids  . CORONARY ARTERY BYPASS GRAFT  06/13/2008   graft x1 w/vein graft LAD artery by Dr. Amador Cunas at [facility]  . CORONARY STENT PLACEMENT  03/24/2014   DES to LAD  . CYSTECTOMY     Left leg  . INTRAOPERATIVE TRANSESOPHAGEAL ECHOCARDIOGRAM N/A 04/22/2014   Procedure: INTRAOPERATIVE TRANSTHORACIC ECHOCARDIOGRAM;  Surgeon: [name], MD;  Location: [place];  Service: Open Heart Surgery;  Laterality: N/A;  . LEFT AND RIGHT HEART CATHETERIZATION WITH CORONARY ANGIOGRAM N/A 01/08/2014   Procedure: LEFT AND RIGHT HEART CATHETERIZATION WITH CORONARY ANGIOGRAM;  Surgeon: Sanda Klein, MD;  Location: [place] CATH LAB;  Service: Cardiovascular;  Laterality: N/A;  . MITRAL VALVE REPLACEMENT  06/13/2008   #20 St. Jude Bicor mitral valve ;Maze procedure at [facility] by Dr. Amador Cunas  . PACEMAKER PLACEMENT  07/13/2008   Adapata dual chamber-Medtronic at [facility]   . PERCUTANEOUS CORONARY ROTOBLATOR INTERVENTION (PCI-R) N/A 03/24/2014   Procedure: PERCUTANEOUS CORONARY ROTOBLATOR INTERVENTION (PCI-R);  Surgeon: Blane Ohara, MD;  Location: [facility] CATH LAB;  Service: Cardiovascular;  Laterality:  N/A;  . PROSTATECTOMY    . SKIN CANCER EXCISION     removed from forehead and right leg, nose/face  . TEE WITHOUT CARDIOVERSION N/A 01/22/2014   Procedure: TRANSESOPHAGEAL ECHOCARDIOGRAM (TEE);  Surgeon: Sanda Klein, MD;  Location: [facility] ENDOSCOPY;  Service: Cardiovascular;  Laterality: N/A;  . TONSILLECTOMY    . TRANSCATHETER AORTIC VALVE REPLACEMENT, TRANSFEMORAL N/A 04/22/2014   Procedure: TRANSCATHETER AORTIC VALVE REPLACEMENT, TRANSFEMORAL;  Surgeon: [name], MD;  Location: [place];  Service: Open Heart Surgery;  Laterality: N/A;  TAVR-TF (RIGHT SIDE)  . UPPER GASTROINTESTINAL ENDOSCOPY  07/14/2009   erosive reflux esopagitis, Barrett's esophagus       Home Medications    Prior  to Admission medications   Medication Sig Start Date End Date Taking? Authorizing Provider  clopidogrel (PLAVIX) 75 MG tablet Take 1 tablet (75 mg total) by mouth daily with breakfast. 07/29/16  Yes [name] [name], MD  ferrous sulfate 325 (65 FE) MG tablet Take 325 mg by mouth daily with breakfast.   Yes Historical Provider, MD  furosemide (LASIX) 40 MG tablet Take 40mg  daily EXCEPT every Mon, Wed, Fri take 80mg  daily 10/05/16  Yes Luke K Kilroy, PA-C  methimazole (TAPAZOLE) 5 MG tablet TAKE 1 TABLET (5 MG TOTAL) BY MOUTH EVERY MONDAY, WEDNESDAY, AND FRIDAY. 03/31/15  Yes [name], MD  metoprolol succinate (TOPROL-XL) 50 MG 24 hr tablet Over the next 10 days, take 1 pill every other day, then STOP Patient taking differently: Take 50 mg by mouth daily.  09/24/16  Yes Liliane Shi, PA-C  Multiple Vitamin (MULTIVITAMIN WITH MINERALS) TABS Take 1 tablet by mouth daily. Centrum Silver   Yes Historical Provider, MD  pantoprazole (PROTONIX) 40 MG tablet Take 1 tablet (40 mg total) by mouth daily. Patient taking differently: Take 40 mg by mouth 2 (two) times daily.  11/09/16  Yes [name], MD  simvastatin (ZOCOR) 40 MG tablet Take 1 tablet (40 mg total) by mouth at bedtime. 05/26/16  Yes [name], MD  Tamsulosin HCl (FLOMAX) 0.4 MG CAPS Take 0.4 mg by mouth every evening.    Yes Historical Provider, MD  potassium chloride SA (KLOR-CON M20) 20 MEQ tablet Take 1 tablet (20 mEq total) by mouth daily. 08/16/16 09/21/16  Sanda Klein, MD    Family History Family History  Problem Relation Age of Onset  . Breast cancer Mother   . Stroke Mother   . Transient ischemic attack Mother     Social History Social History  Substance Use Topics  . Smoking status: Former Smoker    Years: 2.00    Types: Pipe    Quit date: 11/07/1965  . Smokeless tobacco: Never Used     Comment: quit over 40 years ago, never smoked cigarettes  . Alcohol use No     Allergies   Patient has no known  allergies.   Review of Systems Review of Systems  Respiratory: Positive for cough and shortness of breath (exertional).   All other systems reviewed and are negative.    Physical Exam Updated Vital Signs BP 132/97   Pulse 100   Temp 98.7 F (37.1 C) (Oral)   Resp 16   SpO2 95%   Physical Exam  Constitutional: He is oriented to person, place, and time. He appears well-developed and well-nourished.  HENT:  Head: Normocephalic and atraumatic.  Mouth/Throat: Oropharynx is clear and moist.  Eyes: Conjunctivae and EOM are normal. Pupils are equal, round, and reactive to light.  Neck: Normal range of  motion.  Cardiovascular: Normal rate, regular rhythm and normal heart sounds.   Pulmonary/Chest: Effort normal. He has rhonchi. He has rales.  Rales noted at the bases with intermixed rhonchi, worse on the right; able to speak in sentences during exam; did appear to get winded while trying maneuver in bed and put on gown  Abdominal: Soft. Bowel sounds are normal.  Musculoskeletal: Normal range of motion.  Neurological: He is alert and oriented to person, place, and time.  Skin: Skin is warm and dry.  Psychiatric: He has a normal mood and affect.  Nursing note and vitals reviewed.    ED Treatments / Results  Labs (all labs ordered are listed, but only abnormal results are displayed) Labs Reviewed  CBC WITH DIFFERENTIAL/PLATELET - Abnormal; Notable for the following:       Result Value   RBC 4.06 (*)    Hemoglobin 12.2 (*)    HCT 38.6 (*)    Platelets 128 (*)    All other components within normal limits  BASIC METABOLIC PANEL - Abnormal; Notable for the following:    Glucose, Bld 107 (*)    BUN 29 (*)    Calcium 8.8 (*)    All other components within normal limits  BRAIN NATRIURETIC PEPTIDE - Abnormal; Notable for the following:    B Natriuretic Peptide 392.0 (*)    All other components within normal limits  I-STAT TROPOININ, ED - Abnormal; Notable for the following:     Troponin i, poc 0.27 (*)    All other components within normal limits  TROPONIN I    EKG  EKG Interpretation None       Radiology Dg Chest 2 View  Result Date: 12/17/2016 CLINICAL DATA:  Bloody cough for 2 days. EXAM: CHEST  2 VIEW COMPARISON:  09/21/2016 FINDINGS: The lungs are hyperinflated likely secondary to COPD. There is bilateral mild interstitial thickening. There is a small right pleural effusion. There is right lower lobe airspace disease. There is no pneumothorax. There is stable cardiomegaly. There is evidence of prior CABG. There is evidence of prior T AVR. There is a dual lead cardiac pacemaker. There is a large hiatal hernia. The osseous structures are unremarkable. IMPRESSION: 1. Cardiomegaly with mild pulmonary vascular congestion. 2. Right lower lobe airspace disease which may reflect atelectasis versus pneumonia. Followup PA and lateral chest X-ray is recommended in 3-4 weeks following trial of antibiotic therapy to ensure resolution and exclude underlying malignancy. Electronically Signed   By: Kathreen Devoid   On: 12/17/2016 12:53    Procedures Procedures (including critical care time)  Medications Ordered in ED Medications - No data to display   Initial Impression / Assessment and Plan / ED Course  I have reviewed the triage vital signs and the nursing notes.  Pertinent labs & imaging results that were available during my care of the patient were reviewed by me and considered in my medical decision making (see chart for details).  83 year old male here with productive cough and shortness of breath. He denies chest pain. Patient is afebrile and nontoxic in appearance. He does have some Rales at the bases with intermixed rhonchi, worse on the right. Cough is wet but nonproductive during exam. Chest x-ray obtained concerning for pneumonia as well as some vascular congestion.  Lab work with elevated troponin as well as elevated BNP. EKG without acute ischemic changes.  She remains without chest pain. Suspect elevated troponin from his heart failure/demand ischemia. Given dose of ASA.  During ED  stay patient's O2 sats have been trending down, now 88% on RA--- 2L applied, improved to 95%.  Patient started on rocephin/azithromycin for CAP.  Additional dose of lasix given.  Patient admitted to hospitalist service for ongoing care.  Final Clinical Impressions(s) / ED Diagnoses   Final diagnoses:  Community acquired pneumonia of right lower lobe of lung [facility])  Pulmonary vascular congestion  Elevated troponin    New Prescriptions New Prescriptions   No medications on file     Larene Pickett, PA-C 12/17/16 [place], MD 12/18/16 [phone]

## 2016-12-17 NOTE — H&P (Signed)
History and Physical  Patient Name: [name] [name]     [id number]    DOB: August 14, 1929    DOA: 12/17/2016 PCP: [name], MD   Patient coming from: [place]  Chief Complaint: Cough, hemoptysis  HPI: [name] [name] is a 83 y.o. male with a past medical history significant for CAD s/p CABG, TAVR and MVR, pAF not on AC, hyperthyroidism on methimazole, pacer, and CHF EF 55% who presents with cough and hemoptysis.  The patient was in his usual state of health until about a week ago he developed a cough. This became more productive over the last week, until today when he had blood-tinged thick sputum. In addition he felt short of breath with exertion and weak, so he came to the ER. There was no chest pain, leg swelling, recent sedentary lifestyle, recent surgery. There is been no change in his chronic orthopnea, or increase in swelling. He is taking his Lasix as prescribed.  ED course: -Afebrile, heart rate 100, respirations and pulse ox normal, blood pressure 132/97 -Na 145, K 4.0, Cr 0.88, WBC 6.6K, Hgb 12.2 -BNP 392 -Troponin 0.27 -Chest x-ray showed right lower lobe opacity, smaller pleural effusions than previous -ECG showed occasional ectopy -She was given IV furosemide, ceftriaxone and azithromycin and TRH was asked to admit for pneumonia      ROS: Review of Systems  Constitutional: Negative for chills, fever and malaise/fatigue.  HENT: Negative for congestion and sore throat.   Respiratory: Positive for cough, hemoptysis, sputum production, shortness of breath and wheezing.   Cardiovascular: Negative for chest pain, palpitations, orthopnea, leg swelling and PND.  Musculoskeletal: Negative for myalgias.  All other systems reviewed and are negative.         Past Medical History:  Diagnosis Date  . ANEMIA, IRON DEFICIENCY 10/13/2008  . ANXIETY 09/04/2008  . Aortic valvular disorder 08/16/2012   echo EF 45-50% LV mildly reduce,Biocor mitral valve,mild  to mod. tricuspid regrug.,mild to mod. aortic regrug.  . Aortic valvular stenosis 11/19/2013  . ATRIAL FIBRILLATION, CHRONIC 01/14/2008   on warfarin since 2007  . BARRETTS ESOPHAGUS 03/20/2009  . BASAL CELL CARCINOMA OF SKIN SITE UNSPECIFIED 09/28/2010  . Cardiomyopathy, ischemic 11/19/2013   EF 45-50% with inferior wall hypokinesis by echo 2014  . CATARACTS, BILATERAL, HX OF 01/14/2008  . CEREBROVASCULAR ACCIDENT, HX OF 08/18/2008  . CHF (congestive heart failure) ([place])   . COAGULOPATHY, COUMADIN-INDUCED 01/14/2008  . COLONIC POLYPS, ADENOMATOUS 01/14/2008  . CORONARY ARTERY DISEASE 06/01/2007   Cath 04/03/2008; Cath 11/01/1990  . Dysrhythmia    HX OF TACHY BRADY  . Edema, peripheral 07/30/2009   LEV doppler- no thrombus or thrombophlebitis  . Elevated PSA   . GASTRIC POLYP 05/13/2009  . GERD 06/01/2007  . Hyperglycemia   . HYPERLIPIDEMIA 06/01/2007  . HYPERTENSION 06/01/2007  . HYPERTHYROIDISM 01/06/2011  . Mitral regurgitation 06/13/2008   mitral valve replacement #20 St. Jude Biocor; Maze procedure by Dr. Amador Cunas  at [facility]  . Mitral stenosis    Porcine bioprosthetic tissue valve placed 06/13/2008  . Mitral valve disorder 09/08/2011   echo EF 50-55% LV normal  . Pacemaker   . Peripheral artery disease ([place]) 06/16/2010   LEA doppler-left ABI nml 0.61 calcified vessels;right ABI  0.68  . PERSONAL HX COLONIC POLYPS 01/01/2010  . PLEURAL EFFUSION, LEFT 10/13/2008  . PROSTATE CANCER, HX OF 01/14/2008  . Prosthetic valve dysfunction    Mitral stenosis involving bioprosthetic tissue valve placed 06/13/2008  . Restless leg syndrome   .  S/P CABG x 1 06/13/2008   SVG to LAD with open vein harvest right thigh, LIMA harvested but not utilized by Dr Amador Cunas  . S/P Maze operation for atrial fibrillation 06/13/2008   Partial left atrial lesion set using cryothermy for bilateral pulmonary vein isolation by Dr Amador Cunas  . S/P mitral valve replacement with bioprosthetic valve 06/13/2008   94mm St Jude Biocor porcine  bioprosthetic tissue valve by Dr Amador Cunas  . 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 Tachy-brady syndrome ([place]) 07/13/2008   medtronic Adapta gen. model ADDR01 ,serial # [id number] H by Dr  [name] at [facility]  . UNSPECIFIED VENOUS INSUFFICIENCY 09/18/2008    Past Surgical History:  Procedure Laterality Date  . CARDIAC CATHETERIZATION  11/01/1990  . CARDIOVASCULAR STRESS TEST  06/03/2010   Persantine perfusion EF 45% mild to moderate ischemia basal and mid inferolateral region  . CARDIOVASCULAR STRESS TEST  01/01/2008   left ventricle normal,no significant ischemia  . CARDIOVASCULAR STRESS TEST  09/09/2005    possibility of ischemia inferior wall  . COLONOSCOPY  07/14/2009   diverticulosis, internal hemorrhoids  . CORONARY ARTERY BYPASS GRAFT  06/13/2008   graft x1 w/vein graft LAD artery by Dr. Amador Cunas at [facility]  . CORONARY STENT PLACEMENT  03/24/2014   DES to LAD  . CYSTECTOMY     Left leg  . INTRAOPERATIVE TRANSESOPHAGEAL ECHOCARDIOGRAM N/A 04/22/2014   Procedure: INTRAOPERATIVE TRANSTHORACIC ECHOCARDIOGRAM;  Surgeon: [name], MD;  Location: [place];  Service: Open Heart Surgery;  Laterality: N/A;  . LEFT AND RIGHT HEART CATHETERIZATION WITH CORONARY ANGIOGRAM N/A 01/08/2014   Procedure: LEFT AND RIGHT HEART CATHETERIZATION WITH CORONARY ANGIOGRAM;  Surgeon: Sanda Klein, MD;  Location: [place] CATH LAB;  Service: Cardiovascular;  Laterality: N/A;  . MITRAL VALVE REPLACEMENT  06/13/2008   #20 St. Jude Bicor mitral valve ;Maze procedure at [facility] by Dr. Amador Cunas  . PACEMAKER PLACEMENT  07/13/2008   [place] dual chamber-Medtronic at [facility]   . PERCUTANEOUS CORONARY ROTOBLATOR INTERVENTION (PCI-R) N/A 03/24/2014   Procedure: PERCUTANEOUS CORONARY ROTOBLATOR INTERVENTION (PCI-R);  Surgeon: Blane Ohara, MD;  Location: [facility] CATH LAB;  Service: Cardiovascular;  Laterality:  N/A;  . PROSTATECTOMY    . SKIN CANCER EXCISION     removed from forehead and right leg, nose/face  . TEE WITHOUT CARDIOVERSION N/A 01/22/2014   Procedure: TRANSESOPHAGEAL ECHOCARDIOGRAM (TEE);  Surgeon: Sanda Klein, MD;  Location: [facility] ENDOSCOPY;  Service: Cardiovascular;  Laterality: N/A;  . TONSILLECTOMY    . TRANSCATHETER AORTIC VALVE REPLACEMENT, TRANSFEMORAL N/A 04/22/2014   Procedure: TRANSCATHETER AORTIC VALVE REPLACEMENT, TRANSFEMORAL;  Surgeon: [name], MD;  Location: [place];  Service: Open Heart Surgery;  Laterality: N/A;  TAVR-TF (RIGHT SIDE)  . UPPER GASTROINTESTINAL ENDOSCOPY  07/14/2009   erosive reflux esopagitis, Barrett's esophagus    Social History: Patient lives with his wife in independent living at [place].  He is from [place] originally, worked for [employer], moved here ~1997.  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  The patient walks unassistd.  Remote former smoker, minimal.  Rare alcohol.    No Known Allergies  Family history: family history includes Breast cancer in his mother; Stroke in his mother; Transient ischemic attack in his mother.  Prior to Admission medications   Medication Sig Start Date End Date Taking? Authorizing Provider  clopidogrel (PLAVIX) 75 MG tablet Take 1 tablet (75 mg total) by mouth daily  with breakfast. 07/29/16  Yes [name] [name], MD  ferrous sulfate 325 (65 FE) MG tablet Take 325 mg by mouth daily with breakfast.   Yes Historical Provider, MD  furosemide (LASIX) 40 MG tablet Take 40mg  daily EXCEPT every Mon, Wed, Fri take 80mg  daily 10/05/16  Yes Luke K Kilroy, PA-C  methimazole (TAPAZOLE) 5 MG tablet TAKE 1 TABLET (5 MG TOTAL) BY MOUTH EVERY MONDAY, WEDNESDAY, AND FRIDAY. 03/31/15  Yes [name], MD  metoprolol succinate (TOPROL-XL) 50 MG 24 hr tablet Over the next 10 days, take 1 pill every other day, then STOP Patient taking differently: Take 50 mg by mouth daily.  09/24/16  Yes Liliane Shi, PA-C  Multiple Vitamin (MULTIVITAMIN WITH  MINERALS) TABS Take 1 tablet by mouth daily. Centrum Silver   Yes Historical Provider, MD  pantoprazole (PROTONIX) 40 MG tablet Take 1 tablet (40 mg total) by mouth daily. Patient taking differently: Take 40 mg by mouth 2 (two) times daily.  11/09/16  Yes [name], MD  simvastatin (ZOCOR) 40 MG tablet Take 1 tablet (40 mg total) by mouth at bedtime. 05/26/16  Yes [name], MD  Tamsulosin HCl (FLOMAX) 0.4 MG CAPS Take 0.4 mg by mouth every evening.    Yes Historical Provider, MD  potassium chloride SA (KLOR-CON M20) 20 MEQ tablet Take 1 tablet (20 mEq total) by mouth daily. 08/16/16 09/21/16  [name] [name], MD       Physical Exam: BP 132/97   Pulse 100   Temp 98.7 F (37.1 C) (Oral)   Resp 16   SpO2 95%  General appearance: Thin elderly adult male, alert and in no acute distress on nasal cannula.   Eyes: Anicteric, conjunctiva pink, lids and lashes normal. PERRL.    ENT: No nasal deformity, discharge, epistaxis.  Hearing normal. OP moist without lesions.   Neck: No neck masses.  Trachea midline.  No thyromegaly/tenderness. Lymph: No cervical or supraclavicular lymphadenopathy. Skin: Warm and dry.  No jaundice.  No suspicious rashes or lesions.  Chronic venous stasis changes of legs. Cardiac: RRR, nl S1-S2, I actually don't here his aortic murmur, although his wheezing is loud.  Capillary refill is brisk.  JVP not elevated.  Minimal LE edema.  Radial pulses 2+ and symmetric. Respiratory: Tachypneic, very kyphotic, a little accessory muscle use, poor chest excursion.  Coarse at both bases.  Wheezing diffusely. Abdomen: Abdomen soft.  No TTP. No ascites, distension, hepatosplenomegaly.   MSK: No deformities or effusions.  No cyanosis or clubbing. Neuro: Cranial nerves grossly normal.  Sensation intact to light touch. Speech is fluent.  Muscle strength 5/5 and symmetric.    Psych: Sensorium intact and responding to questions, attention normal.  Behavior appropriate.  Affect normal.   Judgment and insight appear normal, tangential with discussion but not disoriented.     Labs on Admission:  I have personally reviewed following labs and imaging studies: CBC:  Recent Labs Lab 12/17/16 1357  WBC 6.6  NEUTROABS 5.3  HGB 12.2*  HCT 38.6*  MCV 95.1  PLT 0000000*   Basic Metabolic Panel:  Recent Labs Lab 12/17/16 1357  NA 145  K 4.0  CL 108  CO2 28  GLUCOSE 107*  BUN 29*  CREATININE 0.88  CALCIUM 8.8*   GFR: Estimated Creatinine Clearance (by C-G formula based on SCr of 0.88 mg/dL) Male: 50.5 mL/min Male: 63 mL/min  Liver Function Tests: No results for input(s): AST, ALT, ALKPHOS, BILITOT, PROT, ALBUMIN in the last 168 hours. No results for  input(s): LIPASE, AMYLASE in the last 168 hours. No results for input(s): AMMONIA in the last 168 hours. Coagulation Profile: No results for input(s): INR, PROTIME in the last 168 hours. Cardiac Enzymes: No results for input(s): CKTOTAL, CKMB, CKMBINDEX, TROPONINI in the last 168 hours. BNP (last 3 results) No results for input(s): PROBNP in the last 8760 hours. HbA1C: No results for input(s): HGBA1C in the last 72 hours. CBG: No results for input(s): GLUCAP in the last 168 hours. Lipid Profile: No results for input(s): CHOL, HDL, LDLCALC, TRIG, CHOLHDL, LDLDIRECT in the last 72 hours. Thyroid Function Tests: No results for input(s): TSH, T4TOTAL, FREET4, T3FREE, THYROIDAB in the last 72 hours. Anemia Panel: No results for input(s): VITAMINB12, FOLATE, FERRITIN, TIBC, IRON, RETICCTPCT in the last 72 hours. Sepsis Labs: Invalid input(s): PROCALCITONIN, LACTICIDVEN No results found for this or any previous visit (from the past 240 hour(s)).       Radiological Exams on Admission: Personally reviewed CXR shows large hiatal hernia, smaller pleural effusions bilaterally than before, RLL opacity:  Dg Chest 2 View  Result Date: 12/17/2016 CLINICAL DATA:  Bloody cough for 2 days. EXAM: CHEST  2 VIEW COMPARISON:   09/21/2016 FINDINGS: The lungs are hyperinflated likely secondary to COPD. There is bilateral mild interstitial thickening. There is a small right pleural effusion. There is right lower lobe airspace disease. There is no pneumothorax. There is stable cardiomegaly. There is evidence of prior CABG. There is evidence of prior T AVR. There is a dual lead cardiac pacemaker. There is a large hiatal hernia. The osseous structures are unremarkable. IMPRESSION: 1. Cardiomegaly with mild pulmonary vascular congestion. 2. Right lower lobe airspace disease which may reflect atelectasis versus pneumonia. Followup PA and lateral chest X-ray is recommended in 3-4 weeks following trial of antibiotic therapy to ensure resolution and exclude underlying malignancy. Electronically Signed   By: Kathreen Devoid   On: 12/17/2016 12:53    EKG: Independently reviewed. Rate 67, QTc 446, frequent PVCs, no ST changes.  Echocardiogram June 2017: EF 55%        Assessment/Plan  1. Community acquired pneumonia:  Presents with cough, now productive of rusty sputum, shortness of breath, new chest opacity, rales on exam. PE doubted clinically. No risk factors for DR organisms.   -Ceftriaxone and azithromycin -Sputum culture ordered -Urine antigens ordered   2. Elevated troponin:  Suspect this is demand insetting of pneumonia. -Trend troponin -Monitor on telemetry overnight  3. Chronic diastolic and systolic CHF:  Appears euvolemic. Given furosemide in the ER. -Strict I/O's, daily weights -Continue home furosemide  4. CAD status post CABG:  -Continue Plavix and statin  5. Hyperthyroidism:  Asymptomatic. -Continue methimazole  6. Permanent atrial fibrillation:  CHADS2-VASc 4. Not on anticoagulation because of previous GI bleeding. -Continue metoprolol  7. Other medications:  -Continue PPI and Flomax     DVT prophylaxis: Lovenox  Code Status: DO NOT RESUSCITATE  Family Communication: None present    Disposition Plan: Anticipate antibiotics and sputum culture.  Follow troponin and if rising, consult to cardiology. Consults called: None Admission status: INPATIENT       Medical decision making: Patient seen at 3:30 PM on 12/17/2016.  What exists of the patient's chart was reviewed in depth and summarized above.  Clinical condition: stable from respiratory standpoint.        Edwin Dada [facility] Pager [phone]     At the time of admission, it appears that the appropriate admission status for this patient is  INPATIENT. This is judged to be reasonable and necessary in order to provide the required intensity of service to ensure the patient's safety given the presenting symptoms, physical exam findings, and initial radiographic and laboratory data in the context of their chronic comorbidities.  Together, these circumstances are felt to place him at high risk for further clinical deterioration threatening life, limb, or organ.   Patient requires inpatient status due to high intensity of service, high risk for further deterioration and high frequency of surveillance required because of this acute illness that poses a threat to life, limb or bodily function.  Factors support inpatient status include pneumonia in a patient with advanced age, evidence of myocardial infarction, hypoxia requiring supplemental oxygen, and comorbid chronic diastolic and systolic CHF, valvular disease, and coronary disease.  I certify that at the point of admission it is my clinical judgment that the patient will require inpatient hospital care spanning beyond 2 midnights from the point of admission and that early discharge would result in unnecessary risk of decompensation and readmission or threat to life, limb or bodily function.

## 2016-12-17 NOTE — ED Triage Notes (Signed)
Pt states "ive had a cough for a week or so, and I started noticing two days ago that I was coughing up blood." Pt states it was "pink tinged sputum". Pt states hes on lasix treatment and that he has a hernia that's pressing on his lungs. Pt denies pain. Just states his throat is irritated when he coughs. Pt is on plavix.

## 2016-12-17 NOTE — ED Notes (Signed)
Notified MD Linker of lab results

## 2016-12-18 ENCOUNTER — Inpatient Hospital Stay (HOSPITAL_COMMUNITY): Payer: Medicare Other

## 2016-12-18 DIAGNOSIS — R748 Abnormal levels of other serum enzymes: Secondary | ICD-10-CM

## 2016-12-18 DIAGNOSIS — I482 Chronic atrial fibrillation: Secondary | ICD-10-CM

## 2016-12-18 DIAGNOSIS — J181 Lobar pneumonia, unspecified organism: Secondary | ICD-10-CM

## 2016-12-18 DIAGNOSIS — R0600 Dyspnea, unspecified: Secondary | ICD-10-CM

## 2016-12-18 LAB — BASIC METABOLIC PANEL
Anion gap: 8 (ref 5–15)
BUN: 27 mg/dL — AB (ref 6–20)
CALCIUM: 8.4 mg/dL — AB (ref 8.9–10.3)
CO2: 29 mmol/L (ref 22–32)
Chloride: 105 mmol/L (ref 101–111)
Creatinine, Ser: 0.88 mg/dL (ref 0.61–1.24)
GFR calc Af Amer: >60 mL/min (ref >60)
GLUCOSE: 94 mg/dL (ref 65–99)
Potassium: 3.6 mmol/L (ref 3.5–5.1)
Sodium: 142 mmol/L (ref 135–145)

## 2016-12-18 LAB — CBC
HCT: 36.3 % — ABNORMAL LOW (ref 39.0–52.0)
Hemoglobin: 11.4 g/dL — ABNORMAL LOW (ref 13.0–17.0)
MCH: 29.8 pg (ref 26.0–34.0)
MCHC: 31.4 g/dL (ref 30.0–36.0)
MCV: 94.8 fL (ref 78.0–100.0)
PLATELETS: 110 10*3/uL — AB (ref 150–400)
RBC: 3.83 MIL/uL — AB (ref 4.22–5.81)
RDW: 14.1 % (ref 11.5–15.5)
WBC: 4.8 10*3/uL (ref 4.0–10.5)

## 2016-12-18 LAB — ECHOCARDIOGRAM COMPLETE
Height: 70 in
WEIGHTICAEL: 2512 [oz_av]

## 2016-12-18 LAB — TROPONIN I: Troponin I: 0.28 ng/mL (ref <0.03)

## 2016-12-18 MED ORDER — FUROSEMIDE 80 MG PO TABS: 80.0000 mg | ORAL_TABLET | Freq: Every day | ORAL | Status: DC

## 2016-12-18 MED ORDER — POTASSIUM CHLORIDE CRYS ER 20 MEQ PO TBCR: 40.0000 meq | EXTENDED_RELEASE_TABLET | Freq: Every day | ORAL | Status: DC

## 2016-12-18 MED ORDER — POTASSIUM CHLORIDE CRYS ER 20 MEQ PO TBCR
40.0000 meq | EXTENDED_RELEASE_TABLET | Freq: Two times a day (BID) | ORAL | Status: DC
  Administered 2016-12-18 – 2016-12-21 (×7): 40 meq via ORAL
  Filled 2016-12-18 (×7): qty 2

## 2016-12-18 MED ORDER — FUROSEMIDE 10 MG/ML IJ SOLN
40.0000 mg | Freq: Two times a day (BID) | INTRAMUSCULAR | Status: DC
  Administered 2016-12-18 – 2016-12-20 (×6): 40 mg via INTRAVENOUS
  Filled 2016-12-18 (×6): qty 4

## 2016-12-18 MED ORDER — ALBUTEROL SULFATE (2.5 MG/3ML) 0.083% IN NEBU
2.5000 mg | INHALATION_SOLUTION | Freq: Three times a day (TID) | RESPIRATORY_TRACT | Status: DC
  Administered 2016-12-18 – 2016-12-21 (×7): 2.5 mg via RESPIRATORY_TRACT
  Filled 2016-12-18 (×9): qty 3

## 2016-12-18 NOTE — Evaluation (Signed)
Physical Therapy Evaluation Patient Details Name: [name] [name] MRN: [id number] DOB: 10/07/1929 Today's Date: 12/18/2016   History of Present Illness  [name] [name] is a 83 y.o. male with a past medical history significant for CAD s/p CABG, TAVR and MVR, pAF not on AC, hyperthyroidism on methimazole, pacer, and CHF EF 55% who presents with cough and hemoptysis.  Clinical Impression   Pt admitted with above diagnosis. Pt currently with functional limitations due to the deficits listed below (see PT Problem List). Presents with decr functional capacity and dcer balance; will benefit from more gait training with RW, and maybe could graduate to single point cane; Lives with wife, she is in hospital with the flu; she will likely only be able to give supervision/setup assistance; Are [facility] services built-in at [facility]? Pt will benefit from skilled PT to increase their independence and safety with mobility to allow discharge to the venue listed below.       Follow Up Recommendations Home health PT;Supervision/Assistance - 24 hour    Equipment Recommendations  Rolling walker with 5" wheels;3in1 (PT)    Recommendations for Other Services OT consult     Precautions / Restrictions Precautions Precautions: Fall      Mobility  Bed Mobility Overal bed mobility: Needs Assistance Bed Mobility: Supine to Sit     Supine to sit: Min assist     General bed mobility comments: Hand held assist to pull to sit  Transfers Overall transfer level: Needs assistance Equipment used: Rolling walker (2 wheeled) Transfers: Sit to/from Stand Sit to Stand: Min assist         General transfer comment: Min assist to steady; noting posterior lean  Ambulation/Gait Ambulation/Gait assistance: Min assist Ambulation Distance (Feet): 60 Feet Assistive device: Rolling walker (2 wheeled) Gait Pattern/deviations: Step-through pattern;Trunk flexed     General Gait Details: Occasional slight  posterior leans, though no gross loss of balance  Stairs            Wheelchair Mobility    Modified Rankin (Stroke Patients Only)       Balance Overall balance assessment: Needs assistance   Sitting balance-Leahy Scale: Fair       Standing balance-Leahy Scale: Poor                               Pertinent Vitals/Pain Pain Assessment: No/denies pain    Home Living Family/patient expects to be discharged to:: Private residence (Independent Living at [facility]) Living Arrangements: Spouse/significant other (currently inpt with the flu) Available Help at Discharge: Family;Available 24 hours/day Type of Home: House Home Access: Level entry     Home Layout: One level Home Equipment: Walker - 2 wheels      Prior Function Level of Independence: Independent               Hand Dominance        Extremity/Trunk Assessment   Upper Extremity Assessment Upper Extremity Assessment: Overall WFL for tasks assessed    Lower Extremity Assessment Lower Extremity Assessment: Generalized weakness    Cervical / Trunk Assessment Cervical / Trunk Assessment: Kyphotic  Communication   Communication: No difficulties  Cognition Arousal/Alertness: Awake/alert Behavior During Therapy: WFL for tasks assessed/performed Overall Cognitive Status: Within Functional Limits for tasks assessed                      General Comments General comments (skin integrity, edema,  etc.): Walked on [employer] and O2 sats remained greater than or equal to 92%    Exercises     Assessment/Plan    PT Assessment Patient needs continued PT services  PT Problem List Decreased strength;Decreased range of motion;Decreased activity tolerance;Decreased balance;Decreased mobility;Decreased knowledge of use of DME;Decreased knowledge of precautions;Cardiopulmonary status limiting activity          PT Treatment Interventions DME instruction;Gait training;Functional  mobility training;Therapeutic activities;Therapeutic exercise;Balance training;Neuromuscular re-education;Patient/family education    PT Goals (Current goals can be found in the Care Plan section)  Acute Rehab PT Goals Patient Stated Goal: to get better PT Goal Formulation: With patient Time For Goal Achievement: 12/25/16 Potential to Achieve Goals: Good    Frequency Min 3X/week   Barriers to discharge        Co-evaluation               End of Session Equipment Utilized During Treatment: Gait belt Activity Tolerance: Patient tolerated treatment well Patient left: in chair;with call bell/phone within reach;with chair alarm set Nurse Communication: Mobility status         Time: [id number] PT Time Calculation (min) (ACUTE ONLY): 32 min   Charges:   PT Evaluation $PT Eval Moderate Complexity: 1 Procedure PT Treatments $Gait Training: 8-22 mins   PT G CodesColletta [place] 12/18/2016, 6:04 PM  Roney Marion, [place] Pager [phone] Office [phone]

## 2016-12-18 NOTE — Progress Notes (Signed)
  Echocardiogram 2D Echocardiogram has been performed.  [name] [name] 12/18/2016, 3:54 PM

## 2016-12-18 NOTE — Progress Notes (Signed)
PROGRESS NOTE    [name] [name]  [id number] DOB: [birth year] DOA: 12/17/2016 PCP: [name], MD  Brief Narrative: [name] [name] is a 83 y.o. male with a past medical history significant for CAD s/p CABG, TAVR and MVR, pAF not on AC, hyperthyroidism on methimazole, pacer, and CHF EF 55% who presents with cough and hemoptysis. The patient was in his usual state of health until about a week ago he developed a cough. This became more productive over the last week, until today when he had blood-tinged thick sputum. In addition he felt short of breath with exertion and weak, so he came to the ER Chest x-ray showed right lower lobe opacity, smaller pleural effusions than previous  Assessment & Plan:  1. Acute hypoxic resp failure -due to CAP and acute diastolic CHF -continue ABX,  -diurese with IV lasix today -monitor weights, I/Os  2. ACute diastolic CHF -as above -last ECHO with preserved EF -poorly complaint with lasix at home due to work/art classes  3. Elevated troponin:  -Suspect this is demand insetting of pneumonia, flat trend, no chest pain -no evidence of ACs clinically, continue Plavix/metoprolol/statin -check ECHO -I discussed case with Dr.Nahser convering for Dr.[name] this am who agreed with above management  4. CAD status post CABG:  -Continue Plavix and statin - see above, LAD stent in 2015  5. Hyperthyroidism:  Asymptomatic. -Continue methimazole  6. Permanent atrial fibrillation:  CHADS2-VASc 4. Not on anticoagulation because of previous GI bleeding. -Continue metoprolol  7. Other medications:  -Continue PPI and Flomax  DVT prophylaxis: Lovenox  Code Status: DO NOT RESUSCITATE  Family Communication: None present  Disposition Plan: Home in 2days if stable   Consultants:   D/w Cards Dr.Nahser   Antimicrobials:ceftriaxone   Subjective: Has dyspnea on exertion, no chest pain, productive cough  Objective: Vitals:   12/17/16 2049  12/17/16 2149 12/18/16 0237 12/18/16 0530  BP:  127/72  (!) 127/91  Pulse:  (!) 37  79  Resp:  18  18  Temp:  97.4 F (36.3 C)  98.2 F (36.8 C)  TempSrc:  Oral  Oral  SpO2: 95% 98% 97% 97%  Weight:    71.2 kg (157 lb)  Height:        Intake/Output Summary (Last 24 hours) at 12/18/16 1031 Last data filed at 12/18/16 1010  Gross per 24 hour  Intake                0 ml  Output              150 ml  Net             -150 ml   Filed Weights   12/17/16 1903 12/18/16 0530  Weight: 71.6 kg (157 lb 14.4 oz) 71.2 kg (157 lb)    Examination:  General exam: AAOx3 Respiratory system: few basilar ronchi Cardiovascular system: S1 & S2 heard, RRR. No JVD, murmurs, rubs Gastrointestinal system: Abdomen is nondistended, soft and nontender.  Normal bowel sounds heard. Central nervous system: Alert and oriented. No focal neurological deficits. Extremities: 1plus edema Skin: No rashes, lesions or ulcers Psychiatry: Judgement and insight appear normal. Mood & affect appropriate.     Data Reviewed: I have personally reviewed following labs and imaging studies  CBC:  Recent Labs Lab 12/17/16 1357 12/17/16 1621 12/18/16 0517  WBC 6.6 5.6 4.8  NEUTROABS 5.3  --   --   HGB 12.2* 12.5* 11.4*  HCT 38.6* 38.9* 36.3*  MCV  95.1 94.6 94.8  PLT 128* 126* [id number]*   Basic Metabolic Panel:  Recent Labs Lab 12/17/16 1357 12/17/16 1621 12/18/16 0517  NA 145  --  142  K 4.0  --  3.6  CL 108  --  105  CO2 28  --  29  GLUCOSE 107*  --  94  BUN 29*  --  27*  CREATININE 0.88 0.80 0.88  CALCIUM 8.8*  --  8.4*   GFR: Estimated Creatinine Clearance: 61.8 mL/min (by C-G formula based on SCr of 0.88 mg/dL). Liver Function Tests: No results for input(s): AST, ALT, ALKPHOS, BILITOT, PROT, ALBUMIN in the last 168 hours. No results for input(s): LIPASE, AMYLASE in the last 168 hours. No results for input(s): AMMONIA in the last 168 hours. Coagulation Profile: No results for input(s): INR, PROTIME  in the last 168 hours. Cardiac Enzymes:  Recent Labs Lab 12/17/16 1357 12/17/16 1810 12/18/16 0004  TROPONINI 0.21* 0.33* 0.28*   BNP (last 3 results) No results for input(s): PROBNP in the last 8760 hours. HbA1C: No results for input(s): HGBA1C in the last 72 hours. CBG: No results for input(s): GLUCAP in the last 168 hours. Lipid Profile: No results for input(s): CHOL, HDL, LDLCALC, TRIG, CHOLHDL, LDLDIRECT in the last 72 hours. Thyroid Function Tests: No results for input(s): TSH, T4TOTAL, FREET4, T3FREE, THYROIDAB in the last 72 hours. Anemia Panel: No results for input(s): VITAMINB12, FOLATE, FERRITIN, TIBC, IRON, RETICCTPCT in the last 72 hours. Urine analysis:    Component Value Date/Time   COLORURINE ORANGE (A) 04/03/2015 2021   APPEARANCEUR CLOUDY (A) 04/03/2015 2021   LABSPEC 1.028 04/03/2015 2021   PHURINE 5.0 04/03/2015 2021   GLUCOSEU NEGATIVE 04/03/2015 2021   GLUCOSEU NEGATIVE 04/03/2015 1106   HGBUR NEGATIVE 04/03/2015 2021   HGBUR large 10/21/2008 0911   BILIRUBINUR SMALL (A) 04/03/2015 2021   KETONESUR 15 (A) 04/03/2015 2021   PROTEINUR NEGATIVE 04/03/2015 2021   UROBILINOGEN 1.0 04/03/2015 2021   NITRITE NEGATIVE 04/03/2015 2021   LEUKOCYTESUR SMALL (A) 04/03/2015 2021   Sepsis Labs: @LABRCNTIP (procalcitonin:4,lacticidven:4)  )No results found for this or any previous visit (from the past 240 hour(s)).       Radiology Studies: Dg Chest 2 View  Result Date: 12/17/2016 CLINICAL DATA:  Bloody cough for 2 days. EXAM: CHEST  2 VIEW COMPARISON:  09/21/2016 FINDINGS: The lungs are hyperinflated likely secondary to COPD. There is bilateral mild interstitial thickening. There is a small right pleural effusion. There is right lower lobe airspace disease. There is no pneumothorax. There is stable cardiomegaly. There is evidence of prior CABG. There is evidence of prior T AVR. There is a dual lead cardiac pacemaker. There is a large hiatal hernia. The  osseous structures are unremarkable. IMPRESSION: 1. Cardiomegaly with mild pulmonary vascular congestion. 2. Right lower lobe airspace disease which may reflect atelectasis versus pneumonia. Followup PA and lateral chest X-ray is recommended in 3-4 weeks following trial of antibiotic therapy to ensure resolution and exclude underlying malignancy. Electronically Signed   By: Kathreen Devoid   On: 12/17/2016 12:53        Scheduled Meds: . albuterol  2.5 mg Nebulization TID  . azithromycin  500 mg Intravenous Q24H  . cefTRIAXone (ROCEPHIN)  IV  1 g Intravenous Q24H  . clopidogrel  75 mg Oral Q breakfast  . enoxaparin (LOVENOX) injection  40 mg Subcutaneous Q24H  . ferrous sulfate  325 mg Oral Q breakfast  . furosemide  40 mg Intravenous Q12H  . [  START ON 12/19/2016] methimazole  5 mg Oral Q M,W,F  . metoprolol succinate  50 mg Oral Daily  . pantoprazole  40 mg Oral BID  . potassium chloride  40 mEq Oral BID  . simvastatin  40 mg Oral QHS  . tamsulosin  0.4 mg Oral QPM   Continuous Infusions:   LOS: 1 day    Time spent: 68min    Domenic Polite, MD [facility] Pager [phone]  If 7PM-7AM, please contact night-coverage [email or web address] Password [facility] 12/18/2016, 10:31 AM

## 2016-12-19 DIAGNOSIS — E0580 Other thyrotoxicosis without thyrotoxic crisis or storm: Secondary | ICD-10-CM

## 2016-12-19 LAB — PROCALCITONIN: Procalcitonin: 0.27 ng/mL

## 2016-12-19 LAB — BASIC METABOLIC PANEL
Anion gap: 8 (ref 5–15)
BUN: 27 mg/dL — AB (ref 6–20)
CHLORIDE: 104 mmol/L (ref 101–111)
CO2: 32 mmol/L (ref 22–32)
Calcium: 8.4 mg/dL — ABNORMAL LOW (ref 8.9–10.3)
Creatinine, Ser: 0.89 mg/dL (ref 0.61–1.24)
GFR calc Af Amer: >60 mL/min (ref >60)
GFR calc non Af Amer: >60 mL/min (ref >60)
Glucose, Bld: 86 mg/dL (ref 65–99)
POTASSIUM: 4.1 mmol/L (ref 3.5–5.1)
SODIUM: 144 mmol/L (ref 135–145)

## 2016-12-19 MED ORDER — AZITHROMYCIN 500 MG PO TABS
500.0000 mg | ORAL_TABLET | Freq: Every day | ORAL | Status: DC
Start: 2016-12-19 — End: 2016-12-20
  Administered 2016-12-19 – 2016-12-20 (×2): 500 mg via ORAL
  Filled 2016-12-19 (×2): qty 1

## 2016-12-19 NOTE — Progress Notes (Signed)
Physical Therapy Treatment Patient Details Name: [name] [name] MRN: [id number] DOB: [birth year] Today's Date: 12/19/2016    History of Present Illness [name] [name] is a 83 y.o. male with a past medical history significant for CAD s/p CABG, TAVR and MVR, pAF not on AC, hyperthyroidism on methimazole, pacer, and CHF EF 55% who presents with cough and hemoptysis.    PT Comments    Smoother gait today, with less posterior leaning than last session; Worth considering OT consult for ADLs  Follow Up Recommendations  Home health PT;Supervision/Assistance - 24 hour     Equipment Recommendations  Rolling walker with 5" wheels;3in1 (PT)    Recommendations for Other Services OT consult     Precautions / Restrictions Precautions Precautions: Fall Restrictions Weight Bearing Restrictions: No    Mobility  Bed Mobility Overal bed mobility: Needs Assistance Bed Mobility: Supine to Sit     Supine to sit: Min assist     General bed mobility comments: Hand held assist to pull to sit  Transfers Overall transfer level: Needs assistance Equipment used: Rolling walker (2 wheeled) Transfers: Sit to/from Stand Sit to Stand: Min assist         General transfer comment: Min assist to steady; noting posterior lean  Ambulation/Gait Ambulation/Gait assistance: Min guard Ambulation Distance (Feet): 100 Feet Assistive device: Rolling walker (2 wheeled) Gait Pattern/deviations: Step-through pattern;Trunk flexed Gait velocity: slow   General Gait Details: Occasional slight posterior leans, though no gross loss of balance, less than yesterday; smoother gait compared to yesterday   Stairs            Wheelchair Mobility    Modified Rankin (Stroke Patients Only)       Balance                                    Cognition Arousal/Alertness: Awake/alert Behavior During Therapy: WFL for tasks assessed/performed Overall Cognitive Status: Within Functional Limits  for tasks assessed                      Exercises      General Comments General comments (skin integrity, edema, etc.): Walked on room air and O2 sats remained greater than or equal to 89%      Pertinent Vitals/Pain Pain Assessment: No/denies pain    Home Living                      Prior Function            PT Goals (current goals can now be found in the care plan section) Acute Rehab PT Goals Patient Stated Goal: to get better PT Goal Formulation: With patient Time For Goal Achievement: 12/25/16 Potential to Achieve Goals: Good Progress towards PT goals: Progressing toward goals    Frequency    Min 3X/week      PT Plan Current plan remains appropriate    Co-evaluation             End of Session Equipment Utilized During Treatment: Gait belt Activity Tolerance: Patient tolerated treatment well Patient left: in chair;with call bell/phone within reach;with chair alarm set     Time: 1455-1520 PT Time Calculation (min) (ACUTE ONLY): 25 min  Charges:  $Gait Training: 23-37 mins                    G Codes:  Colletta [place] 12/19/2016, 4:21 PM  Roney Marion, [place] Pager [phone] Office [phone]

## 2016-12-19 NOTE — Progress Notes (Addendum)
PROGRESS NOTE    [name] [name]  [id number] DOB: 01-30-1929 DOA: 12/17/2016 PCP: [name], MD  Brief Narrative: [name] [name] is a 83 y.o. male with a past medical history significant for CAD s/p CABG, TAVR and MVR, pAF not on AC, hyperthyroidism on methimazole, pacer, and CHF EF 55% who presents with cough and hemoptysis. The patient was in his usual state of health until about a week ago he developed a cough. This became more productive over the last week, until today when he had blood-tinged thick sputum. In addition he felt short of breath with exertion and weak, so he came to the ER Chest x-ray showed right lower lobe opacity, smaller pleural effusions than previous  Assessment & Plan:  1. Acute hypoxic resp failure -due to CAP and acute diastolic CHF -improving with Abx and lasix -continue Abx  -continue IV lasix today -monitor weights, I/Os-not accurate d/w RN -Bmet in am  2. ACute diastolic CHF -IV lasix as above -last ECHO with preserved EF -poorly complaint with lasix at home due to work/art classes  3. Elevated troponin:  -Suspect this is demand insetting of pneumonia, flat trend, no chest pain -no evidence of ACs clinically, continue Plavix/metoprolol/statin -2D ECHO with normal EF and wall motion and prosthetic valves -I discussed case with Dr.Nahser covering for Dr.[name] 2/11 am who agreed with above management  4. CAD status post CABG:  -Continue Plavix and statin - see above, LAD stent in 2015  5. Hyperthyroidism:  Asymptomatic. -Continue methimazole  6. Permanent atrial fibrillation:  CHADS2-VASc 4. Not on anticoagulation because of previous GI bleeding. -Continue metoprolol  7. Other medications:  -Continue PPI and Flomax  DVT prophylaxis: Lovenox  Code Status: DO NOT RESUSCITATE  Family Communication: None present  Disposition Plan: Home in 1-2days   Consultants:   D/w Cards  Dr.Nahser   Antimicrobials:ceftriaxone   Subjective: improving dyspnea on exertion, no chest pain, some productive cough  Objective: Vitals:   12/18/16 0530 12/18/16 1602 12/18/16 2137 12/19/16 0557  BP: (!) 127/91 104/63 (!) 150/97 132/71  Pulse: 79 71 (!) 113 67  Resp: 18 12 18  (!) 22  Temp: 98.2 F (36.8 C) 98.2 F (36.8 C) 97.8 F (36.6 C) 97.7 F (36.5 C)  TempSrc: Oral Oral Oral Oral  SpO2: 97% 98% 92% 96%  Weight: 71.2 kg (157 lb)   71.3 kg (157 lb 3.2 oz)  Height:        Intake/Output Summary (Last 24 hours) at 12/19/16 1021 Last data filed at 12/18/16 1242  Gross per 24 hour  Intake                0 ml  Output              600 ml  Net             -600 ml   Filed Weights   12/17/16 1903 12/18/16 0530 12/19/16 0557  Weight: 71.6 kg (157 lb 14.4 oz) 71.2 kg (157 lb) 71.3 kg (157 lb 3.2 oz)    Examination:  General exam: AAOx3 Respiratory system:improved air movement, occasional ronchi and few crackles Cardiovascular system: S1 & S2 heard, RRR. No JVD, murmurs, rubs Gastrointestinal system: Abdomen is nondistended, soft and nontender.  Normal bowel sounds heard. Central nervous system: Alert and oriented. No focal neurological deficits. Extremities: 1plus edema Skin: No rashes, lesions or ulcers Psychiatry: Judgement and insight appear normal. Mood & affect appropriate.     Data Reviewed: I have  personally reviewed following labs and imaging studies  CBC:  Recent Labs Lab 12/17/16 1357 12/17/16 1621 12/18/16 0517  WBC 6.6 5.6 4.8  NEUTROABS 5.3  --   --   HGB 12.2* 12.5* 11.4*  HCT 38.6* 38.9* 36.3*  MCV 95.1 94.6 94.8  PLT 128* 126* [id number]*   Basic Metabolic Panel:  Recent Labs Lab 12/17/16 1357 12/17/16 1621 12/18/16 0517 12/19/16 0533  NA 145  --  142 144  K 4.0  --  3.6 4.1  CL 108  --  105 104  CO2 28  --  29 32  GLUCOSE 107*  --  94 86  BUN 29*  --  27* 27*  CREATININE 0.88 0.80 0.88 0.89  CALCIUM 8.8*  --  8.4* 8.4*    GFR: Estimated Creatinine Clearance: 61.2 mL/min (by C-G formula based on SCr of 0.89 mg/dL). Liver Function Tests: No results for input(s): AST, ALT, ALKPHOS, BILITOT, PROT, ALBUMIN in the last 168 hours. No results for input(s): LIPASE, AMYLASE in the last 168 hours. No results for input(s): AMMONIA in the last 168 hours. Coagulation Profile: No results for input(s): INR, PROTIME in the last 168 hours. Cardiac Enzymes:  Recent Labs Lab 12/17/16 1357 12/17/16 1810 12/18/16 0004  TROPONINI 0.21* 0.33* 0.28*   BNP (last 3 results) No results for input(s): PROBNP in the last 8760 hours. HbA1C: No results for input(s): HGBA1C in the last 72 hours. CBG: No results for input(s): GLUCAP in the last 168 hours. Lipid Profile: No results for input(s): CHOL, HDL, LDLCALC, TRIG, CHOLHDL, LDLDIRECT in the last 72 hours. Thyroid Function Tests: No results for input(s): TSH, T4TOTAL, FREET4, T3FREE, THYROIDAB in the last 72 hours. Anemia Panel: No results for input(s): VITAMINB12, FOLATE, FERRITIN, TIBC, IRON, RETICCTPCT in the last 72 hours. Urine analysis:    Component Value Date/Time   COLORURINE ORANGE (A) 04/03/2015 2021   APPEARANCEUR CLOUDY (A) 04/03/2015 2021   LABSPEC 1.028 04/03/2015 2021   PHURINE 5.0 04/03/2015 2021   GLUCOSEU NEGATIVE 04/03/2015 2021   GLUCOSEU NEGATIVE 04/03/2015 1106   HGBUR NEGATIVE 04/03/2015 2021   HGBUR large 10/21/2008 0911   BILIRUBINUR SMALL (A) 04/03/2015 2021   KETONESUR 15 (A) 04/03/2015 2021   PROTEINUR NEGATIVE 04/03/2015 2021   UROBILINOGEN 1.0 04/03/2015 2021   NITRITE NEGATIVE 04/03/2015 2021   LEUKOCYTESUR SMALL (A) 04/03/2015 2021   Sepsis Labs: @LABRCNTIP (procalcitonin:4,lacticidven:4)  )No results found for this or any previous visit (from the past 240 hour(s)).       Radiology Studies: Dg Chest 2 View  Result Date: 12/17/2016 CLINICAL DATA:  Bloody cough for 2 days. EXAM: CHEST  2 VIEW COMPARISON:  09/21/2016  FINDINGS: The lungs are hyperinflated likely secondary to COPD. There is bilateral mild interstitial thickening. There is a small right pleural effusion. There is right lower lobe airspace disease. There is no pneumothorax. There is stable cardiomegaly. There is evidence of prior CABG. There is evidence of prior T AVR. There is a dual lead cardiac pacemaker. There is a large hiatal hernia. The osseous structures are unremarkable. IMPRESSION: 1. Cardiomegaly with mild pulmonary vascular congestion. 2. Right lower lobe airspace disease which may reflect atelectasis versus pneumonia. Followup PA and lateral chest X-ray is recommended in 3-4 weeks following trial of antibiotic therapy to ensure resolution and exclude underlying malignancy. Electronically Signed   By: Kathreen Devoid   On: 12/17/2016 12:53        Scheduled Meds: . albuterol  2.5 mg Nebulization TID  .  azithromycin  500 mg Oral Q1200  . cefTRIAXone (ROCEPHIN)  IV  1 g Intravenous Q24H  . clopidogrel  75 mg Oral Q breakfast  . enoxaparin (LOVENOX) injection  40 mg Subcutaneous Q24H  . ferrous sulfate  325 mg Oral Q breakfast  . furosemide  40 mg Intravenous Q12H  . methimazole  5 mg Oral Q M,W,F  . metoprolol succinate  50 mg Oral Daily  . pantoprazole  40 mg Oral BID  . potassium chloride  40 mEq Oral BID  . simvastatin  40 mg Oral QHS  . tamsulosin  0.4 mg Oral QPM   Continuous Infusions:   LOS: 2 days    Time spent: 71min    Domenic Polite, MD [facility] Pager [phone]  If 7PM-7AM, please contact night-coverage [email or web address] Password [facility] 12/19/2016, 10:21 AM

## 2016-12-20 DIAGNOSIS — I5032 Chronic diastolic (congestive) heart failure: Secondary | ICD-10-CM

## 2016-12-20 DIAGNOSIS — R0989 Other specified symptoms and signs involving the circulatory and respiratory systems: Secondary | ICD-10-CM

## 2016-12-20 LAB — BASIC METABOLIC PANEL
ANION GAP: 8 (ref 5–15)
BUN: 28 mg/dL — ABNORMAL HIGH (ref 6–20)
CALCIUM: 8.5 mg/dL — AB (ref 8.9–10.3)
CO2: 33 mmol/L — ABNORMAL HIGH (ref 22–32)
Chloride: 102 mmol/L (ref 101–111)
Creatinine, Ser: 1.09 mg/dL (ref 0.61–1.24)
GFR, EST NON AFRICAN AMERICAN: 60 mL/min — AB (ref >60)
Glucose, Bld: 117 mg/dL — ABNORMAL HIGH (ref 65–99)
Potassium: 4.1 mmol/L (ref 3.5–5.1)
SODIUM: 143 mmol/L (ref 135–145)

## 2016-12-20 LAB — STREP PNEUMONIAE URINARY ANTIGEN: STREP PNEUMO URINARY ANTIGEN: NEGATIVE

## 2016-12-20 MED ORDER — LEVOFLOXACIN 500 MG PO TABS
500.0000 mg | ORAL_TABLET | Freq: Every day | ORAL | Status: DC
  Administered 2016-12-20 – 2016-12-21 (×2): 500 mg via ORAL
  Filled 2016-12-20 (×3): qty 1

## 2016-12-20 MED ORDER — FUROSEMIDE 40 MG PO TABS
40.0000 mg | ORAL_TABLET | Freq: Every day | ORAL | Status: DC
  Administered 2016-12-21: 40 mg via ORAL
  Filled 2016-12-20: qty 1

## 2016-12-20 MED ORDER — LEVOFLOXACIN 500 MG PO TABS: 500.0000 mg | ORAL_TABLET | Freq: Every day | ORAL | 0 refills | Status: DC

## 2016-12-20 NOTE — Progress Notes (Signed)
PROGRESS NOTE    ALEXANDERJAMES [name]  [id number] DOB: 1929-05-26 DOA: 12/17/2016 PCP: [name], MD  Brief Narrative: [name] [name] is a 83 y.o. male with a past medical history significant for CAD s/p CABG, TAVR and MVR, pAF not on AC, hyperthyroidism on methimazole, pacer, and CHF EF 55% who presents with cough and hemoptysis. The patient was in his usual state of health until about a week ago he developed a cough. This became more productive over the last week, until today when he had blood-tinged thick sputum. In addition he felt short of breath with exertion and weak, so he came to the ER Chest x-ray showed right lower lobe opacity, smaller pleural effusions compared to  previous  Assessment & Plan:  1. Acute hypoxic resp failure -due to CAP and acute diastolic CHF -On IV ceftriaxone/zithromax change to PO levaquin -continue IV lasix today, change to PO tomorrow, negative 3.7 L -finally improving now -wean off O2, OOB to chair  2. ACute diastolic CHF -IV lasix as above -last ECHO with preserved EF -poorly complaint with lasix at home due to work/art classes, asked him to take this after his art class on the days he works  3. Elevated troponin:  -Suspect this is demand insetting of pneumonia, flat trend, no chest pain -no evidence of ACs clinically, continue Plavix/metoprolol/statin -2D ECHO with normal EF and wall motion and prosthetic valves -I discussed case with Dr.Nahser covering for Dr.[name] 2/11 am who agreed with above management  4. CAD status post CABG:  -Continue Plavix and statin - see above, LAD stent in 2015  5. Hyperthyroidism:  Asymptomatic. -Continue methimazole  6. Permanent atrial fibrillation:  CHADS2-VASc 4. Not on anticoagulation because of previous GI bleeding. -Continue metoprolol  7. Other medications:  -Continue PPI and Flomax  DVT prophylaxis: Lovenox  Code Status: DO NOT RESUSCITATE  Family Communication: None present    Disposition Plan: Home tomorrow   Consultants:   D/w Cards Dr.Nahser   Antimicrobials:ceftriaxone   Subjective: improving dyspnea, able to ambulate more  Objective: Vitals:   12/19/16 2131 12/20/16 0513 12/20/16 0926 12/20/16 1121  BP: 109/64 127/66 (!) 148/73   Pulse: 71 60 63   Resp: (!) 26 18    Temp: 98.7 F (37.1 C) 97.3 F (36.3 C)    TempSrc:  Oral    SpO2: 97% 96% 90% 94%  Weight:      Height:        Intake/Output Summary (Last 24 hours) at 12/20/16 1226 Last data filed at 12/20/16 0542  Gross per 24 hour  Intake              350 ml  Output             1800 ml  Net            -1450 ml   Filed Weights   12/17/16 1903 12/18/16 0530 12/19/16 0557  Weight: 71.6 kg (157 lb 14.4 oz) 71.2 kg (157 lb) 71.3 kg (157 lb 3.2 oz)    Examination:  General exam: AAOx3 Respiratory system:better air movement, occasional ronchi and few crackles-much improved Cardiovascular system: S1 & S2 heard, RRR. No JVD, murmurs, rubs Gastrointestinal system: Abdomen is nondistended, soft and nontender.  Normal bowel sounds heard. Central nervous system: Alert and oriented. No focal neurological deficits. Extremities: trace edema Skin: No rashes, lesions or ulcers Psychiatry: Judgement and insight appear normal. Mood & affect appropriate.     Data Reviewed: I have personally  reviewed following labs and imaging studies  CBC:  Recent Labs Lab 12/17/16 1357 12/17/16 1621 12/18/16 0517  WBC 6.6 5.6 4.8  NEUTROABS 5.3  --   --   HGB 12.2* 12.5* 11.4*  HCT 38.6* 38.9* 36.3*  MCV 95.1 94.6 94.8  PLT 128* 126* [id number]*   Basic Metabolic Panel:  Recent Labs Lab 12/17/16 1357 12/17/16 1621 12/18/16 0517 12/19/16 0533 12/20/16 0008  NA 145  --  142 144 143  K 4.0  --  3.6 4.1 4.1  CL 108  --  105 104 102  CO2 28  --  29 32 33*  GLUCOSE 107*  --  94 86 117*  BUN 29*  --  27* 27* 28*  CREATININE 0.88 0.80 0.88 0.89 1.09  CALCIUM 8.8*  --  8.4* 8.4* 8.5*    GFR: Estimated Creatinine Clearance: 50 mL/min (by C-G formula based on SCr of 1.09 mg/dL). Liver Function Tests: No results for input(s): AST, ALT, ALKPHOS, BILITOT, PROT, ALBUMIN in the last 168 hours. No results for input(s): LIPASE, AMYLASE in the last 168 hours. No results for input(s): AMMONIA in the last 168 hours. Coagulation Profile: No results for input(s): INR, PROTIME in the last 168 hours. Cardiac Enzymes:  Recent Labs Lab 12/17/16 1357 12/17/16 1810 12/18/16 0004  TROPONINI 0.21* 0.33* 0.28*   BNP (last 3 results) No results for input(s): PROBNP in the last 8760 hours. HbA1C: No results for input(s): HGBA1C in the last 72 hours. CBG: No results for input(s): GLUCAP in the last 168 hours. Lipid Profile: No results for input(s): CHOL, HDL, LDLCALC, TRIG, CHOLHDL, LDLDIRECT in the last 72 hours. Thyroid Function Tests: No results for input(s): TSH, T4TOTAL, FREET4, T3FREE, THYROIDAB in the last 72 hours. Anemia Panel: No results for input(s): VITAMINB12, FOLATE, FERRITIN, TIBC, IRON, RETICCTPCT in the last 72 hours. Urine analysis:    Component Value Date/Time   COLORURINE ORANGE (A) 04/03/2015 2021   APPEARANCEUR CLOUDY (A) 04/03/2015 2021   LABSPEC 1.028 04/03/2015 2021   PHURINE 5.0 04/03/2015 2021   GLUCOSEU NEGATIVE 04/03/2015 2021   GLUCOSEU NEGATIVE 04/03/2015 1106   HGBUR NEGATIVE 04/03/2015 2021   HGBUR large 10/21/2008 0911   BILIRUBINUR SMALL (A) 04/03/2015 2021   KETONESUR 15 (A) 04/03/2015 2021   PROTEINUR NEGATIVE 04/03/2015 2021   UROBILINOGEN 1.0 04/03/2015 2021   NITRITE NEGATIVE 04/03/2015 2021   LEUKOCYTESUR SMALL (A) 04/03/2015 2021   Sepsis Labs: @LABRCNTIP (procalcitonin:4,lacticidven:4)  )No results found for this or any previous visit (from the past 240 hour(s)).       Radiology Studies: No results found.      Scheduled Meds: . albuterol  2.5 mg Nebulization TID  . azithromycin  500 mg Oral Q1200  . cefTRIAXone  (ROCEPHIN)  IV  1 g Intravenous Q24H  . clopidogrel  75 mg Oral Q breakfast  . enoxaparin (LOVENOX) injection  40 mg Subcutaneous Q24H  . ferrous sulfate  325 mg Oral Q breakfast  . furosemide  40 mg Intravenous Q12H  . methimazole  5 mg Oral Q M,W,F  . metoprolol succinate  50 mg Oral Daily  . pantoprazole  40 mg Oral BID  . potassium chloride  40 mEq Oral BID  . simvastatin  40 mg Oral QHS  . tamsulosin  0.4 mg Oral QPM   Continuous Infusions:   LOS: 3 days    Time spent: 48min    Domenic Polite, MD [facility] Pager [phone]  If 7PM-7AM, please contact night-coverage [email or web address] Password  [email or web address] 12/20/2016, 12:26 PM

## 2016-12-20 NOTE — Discharge Summary (Signed)
Physician Discharge Summary  [name] [name] [id number] DOB: Dec 24, 1928 DOA: 12/17/2016  PCP: [name] Shin, MD  Admit date: 12/17/2016 Discharge date: 12/21/2016  Time spent: 35 minutes  Recommendations for Outpatient Follow-up:  1. PCP Dr.Ellison in 1 week 2. 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 The patient was in his usual state of health until about a week ago he developed a cough. This became more productive over the last week, until today when he had blood-tinged thick sputum. In addition he felt short of breath with exertion and weak, so he came to the ER Chest x-ray showed right lower lobe opacity, smaller pleural effusions compared to  previous  Hospital Course:  1. Acute hypoxic resp failure -due to CAP and acute diastolic CHF -Treated with IV ceftriaxone/zithromax then changed to PO levaquin -diuresed with IV lasix negative >4L -much improved -discharged home on oral levaquin and home dose of PO lasix-advised compliance with this.  2. ACute diastolic CHF -diuresed with IV lasix as above -last ECHO with preserved EF -poorly complaint with lasix at home due to work/art classes, asked him to take this after  his art class on the days he works  3. Elevated troponin: -Suspect this is demand insetting of pneumonia, flat trend, no chest pain -no evidence of ACs clinically, continue Plavix/metoprolol/statin -2D ECHO with normal EF and wall motion and prosthetic valves -I discussed case with Dr.Nahser covering for Dr.[name] 2/11 am who agreed with above management  4. CAD status post CABG: -Continue Plavix and statin, - see above, LAD stent in 2015  5. Hyperthyroidism: Asymptomatic. -Continue methimazole  6. Permanent atrial fibrillation: CHADS2-VASc 4. Not on anticoagulation because of previous GI bleeding. -Continue metoprolol, in nSR  Consultations:  D/w Cardiology Dr.Nahser  Discharge Exam: Vitals:   12/20/16 1433 12/20/16 2102  BP: 120/65 120/71  Pulse: (!) 55 69  Resp: 16 (!) 22  Temp: 97.9 F (36.6 C) 97.8 F (36.6 C)    General: AAOx3 Cardiovascular: S1S2/RRR Respiratory: improved air movement and rales  Discharge Instructions   Discharge Instructions    Diet - low sodium heart healthy    Complete by:  As directed    Increase activity slowly    Complete by:  As directed      Current Discharge Medication List    START taking these medications   Details  levofloxacin (LEVAQUIN) 500 MG tablet Take 1 tablet (500 mg total) by mouth daily. For 3days Qty: 3 tablet, Refills: 0      CONTINUE these medications which have NOT CHANGED   Details  clopidogrel (PLAVIX) 75 MG tablet Take 1 tablet (75 mg total) by mouth daily with breakfast. Qty: 90 tablet, Refills: 3    ferrous  sulfate 325 (65 FE) MG tablet Take 325 mg by mouth daily with breakfast.    furosemide (LASIX) 40 MG tablet Take 40mg  daily EXCEPT every Mon, Wed, Fri take 80mg  daily Qty: 180 tablet, Refills: 1    methimazole (TAPAZOLE) 5 MG tablet TAKE 1 TABLET (5 MG TOTAL) BY MOUTH EVERY MONDAY, WEDNESDAY, AND FRIDAY. Qty: 36 tablet, Refills: 2    metoprolol succinate (TOPROL-XL) 50 MG 24 hr  tablet Over the next 10 days, take 1 pill every other day, then STOP Qty: 1 tablet, Refills: 0    Multiple Vitamin (MULTIVITAMIN WITH MINERALS) TABS Take 1 tablet by mouth daily. Centrum Silver    pantoprazole (PROTONIX) 40 MG tablet Take 1 tablet (40 mg total) by mouth daily. Qty: 90 tablet, Refills: 2   Associated Diagnoses: Barrett's esophagus without dysplasia; Gastroesophageal reflux disease without esophagitis    simvastatin (ZOCOR) 40 MG tablet Take 1 tablet (40 mg total) by mouth at bedtime. Qty: 90 tablet, Refills: 2    Tamsulosin HCl (FLOMAX) 0.4 MG CAPS Take 0.4 mg by mouth every evening.     potassium chloride SA (KLOR-CON M20) 20 MEQ tablet Take 1 tablet (20 mEq total) by mouth daily. Qty: 14 tablet, Refills: 0       No Known Allergies Follow-up Information    [name] Shin, MD. Schedule an appointment as soon as possible for a visit in 1 week(s).   Specialty:  Endocrinology Contact information: [address] [place] [address] [phone]            The results of significant diagnostics from this hospitalization (including imaging, microbiology, ancillary and laboratory) are listed below for reference.    Significant Diagnostic Studies: Dg Chest 2 View  Result Date: 12/17/2016 CLINICAL DATA:  Bloody cough for 2 days. EXAM: CHEST  2 VIEW COMPARISON:  09/21/2016 FINDINGS: The lungs are hyperinflated likely secondary to COPD. There is bilateral mild interstitial thickening. There is a small right pleural effusion. There is right lower lobe airspace disease. There is no pneumothorax. There is stable cardiomegaly. There is evidence of prior CABG. There is evidence of prior T AVR. There is a dual lead cardiac pacemaker. There is a large hiatal hernia. The osseous structures are unremarkable. IMPRESSION: 1. Cardiomegaly with mild pulmonary vascular congestion. 2. Right lower lobe airspace disease which may reflect atelectasis versus pneumonia.  Followup PA and lateral chest X-ray is recommended in 3-4 weeks following trial of antibiotic therapy to ensure resolution and exclude underlying malignancy. Electronically Signed   By: Kathreen Devoid   On: 12/17/2016 12:53    Microbiology: No results found for this or any previous visit (from the past 240 hour(s)).   Labs: Basic Metabolic Panel:  Recent Labs Lab 12/17/16 1357 12/17/16 1621 12/18/16 0517 12/19/16 0533 12/20/16 0008  NA 145  --  142 144 143  K 4.0  --  3.6 4.1 4.1  CL 108  --  105 104 102  CO2 28  --  29 32 33*  GLUCOSE 107*  --  94 86 117*  BUN 29*  --  27* 27* 28*  CREATININE 0.88 0.80 0.88 0.89 1.09  CALCIUM 8.8*  --  8.4* 8.4* 8.5*   Liver Function Tests: No results for input(s): AST, ALT, ALKPHOS, BILITOT, PROT, ALBUMIN in the last 168 hours. No results for input(s): LIPASE, AMYLASE in the last 168 hours. No results for input(s): AMMONIA in the last 168 hours. CBC:  Recent Labs Lab 12/17/16 1357 12/17/16  1621 12/18/16 0517  WBC 6.6 5.6 4.8  NEUTROABS 5.3  --   --   HGB 12.2* 12.5* 11.4*  HCT 38.6* 38.9* 36.3*  MCV 95.1 94.6 94.8  PLT 128* 126* 110*   Cardiac Enzymes:  Recent Labs Lab 12/17/16 1357 12/17/16 1810 12/18/16 0004  TROPONINI 0.21* 0.33* 0.28*   BNP: BNP (last 3 results)  Recent Labs  08/23/16 1155 09/21/16 0919 12/17/16 1357  BNP 237.5* 481.6* 392.0*    ProBNP (last 3 results) No results for input(s): PROBNP in the last 8760 hours.  CBG: No results for input(s): GLUCAP in the last 168 hours.     SignedDomenic Polite MD.  [facility] 12/20/2016, 10:41 PM

## 2016-12-21 DIAGNOSIS — J189 Pneumonia, unspecified organism: Principal | ICD-10-CM

## 2016-12-21 LAB — BASIC METABOLIC PANEL
ANION GAP: 12 (ref 5–15)
BUN: 28 mg/dL — ABNORMAL HIGH (ref 6–20)
CALCIUM: 8.7 mg/dL — AB (ref 8.9–10.3)
CO2: 32 mmol/L (ref 22–32)
CREATININE: 1.05 mg/dL (ref 0.61–1.24)
Chloride: 100 mmol/L — ABNORMAL LOW (ref 101–111)
GFR calc non Af Amer: >60 mL/min (ref >60)
GLUCOSE: 103 mg/dL — AB (ref 65–99)
Potassium: 4.6 mmol/L (ref 3.5–5.1)
Sodium: 144 mmol/L (ref 135–145)

## 2016-12-21 LAB — LEGIONELLA PNEUMOPHILA SEROGP 1 UR AG: L. PNEUMOPHILA SEROGP 1 UR AG: NEGATIVE

## 2016-12-21 LAB — PROCALCITONIN: PROCALCITONIN: 0.21 ng/mL

## 2016-12-21 NOTE — Progress Notes (Signed)
Discharge instructions printed and reviewed with patient, and copy given for them to take home. All questions addressed at this time. New paper prescriptions given to patient to be filled at discharge. IV removed. Room searched for patient belongings, and confirmed with patient that all valuables were accounted for. Patient care tech assisted patient to dress, then staff escorted patient to discharge via wheelchair.

## 2016-12-21 NOTE — Care Management Note (Signed)
Case Management Note  Patient Details  Name: [name] [name] MRN: [id number] Date of Birth: [birth year]  Subjective/Objective:                 Ptwith a past medical history significant for CAD s/p CABG, TAVR and MVR, pAF, hyperthyroidism on methimazole, pacer, and CHF EF 55%who presented with cough and hemoptysis.  [name] (Spouse)     [phone]         PCP: [name]  Action/Plan: Plan is d/c to home today. Pt declined PT's recommendation:home health PT and states he doesn't need rolling walker. " I do just fine, he states."  Expected Discharge Date:  12/21/16               Expected Discharge Plan:  Home/Self Care  In-House Referral:     Discharge planning Services  CM Consult  Status of Service:  Completed, signed off  If discussed at [place] of Stay Meetings, dates discussed:    Additional Comments:  Sharin Mons, RN 12/21/2016, 10:21 AM

## 2016-12-21 NOTE — Progress Notes (Signed)
Patient seen and examined this morning. Remains stable for discharge, denies SOB, chest pain. On room air. Has no complaints. See discharge summary by Dr. Broadus John. Patient encouraged to finish antibiotics, comply with lasix, follow up with PCP and cardiology. Please follow up with CXR in 3-4 weeks after resolution of pneumonia to exclude underlying malignancy.    Dessa Phi, DO [facility] [email or web address] Password [facility] 12/21/2016, 8:26 AM

## 2016-12-22 NOTE — Care Management Important Message (Signed)
Important Message  Patient Details  Name: [name] [name] MRN: [id number] Date of Birth: 08/10/1929   Medicare Important Message Given:  Yes    [name] Abena 12/22/2016, 10:45 AM

## 2016-12-29 ENCOUNTER — Ambulatory Visit (INDEPENDENT_AMBULATORY_CARE_PROVIDER_SITE_OTHER): Payer: Medicare Other | Admitting: Endocrinology

## 2016-12-29 ENCOUNTER — Ambulatory Visit
Admission: RE | Admit: 2016-12-29 | Discharge: 2016-12-29 | Disposition: A | Discharge status: Home / Self Care | Payer: Medicare Other | Source: Ambulatory Visit | Attending: Endocrinology | Admitting: Endocrinology

## 2016-12-29 ENCOUNTER — Encounter: Payer: Self-pay | Admitting: Endocrinology

## 2016-12-29 VITALS — BP 118/70 | HR 68 | Ht 70.0 in | Wt 154.0 lb

## 2016-12-29 DIAGNOSIS — T462X5A Adverse effect of other antidysrhythmic drugs, initial encounter: Secondary | ICD-10-CM

## 2016-12-29 DIAGNOSIS — E0580 Other thyrotoxicosis without thyrotoxic crisis or storm: Secondary | ICD-10-CM

## 2016-12-29 DIAGNOSIS — J189 Pneumonia, unspecified organism: Secondary | ICD-10-CM

## 2016-12-29 DIAGNOSIS — D649 Anemia, unspecified: Secondary | ICD-10-CM

## 2016-12-29 DIAGNOSIS — I509 Heart failure, unspecified: Secondary | ICD-10-CM

## 2016-12-29 LAB — CBC WITH DIFFERENTIAL/PLATELET
Basophils Absolute: 0 10*3/uL (ref 0.0–0.1)
Basophils Relative: 0.3 % (ref 0.0–3.0)
EOS PCT: 1.6 % (ref 0.0–5.0)
Eosinophils Absolute: 0.1 10*3/uL (ref 0.0–0.7)
HCT: 37.1 % — ABNORMAL LOW (ref 39.0–52.0)
Hemoglobin: 12.5 g/dL — ABNORMAL LOW (ref 13.0–17.0)
Lymphocytes Relative: 15.2 % (ref 12.0–46.0)
Lymphs Abs: 1 10*3/uL (ref 0.7–4.0)
MCHC: 33.7 g/dL (ref 30.0–36.0)
MCV: 90.5 fl (ref 78.0–100.0)
MONO ABS: 0.5 10*3/uL (ref 0.1–1.0)
Monocytes Relative: 7.6 % (ref 3.0–12.0)
NEUTROS ABS: 5 10*3/uL (ref 1.4–7.7)
NEUTROS PCT: 75.3 % (ref 43.0–77.0)
Platelets: 185 10*3/uL (ref 150.0–400.0)
RBC: 4.11 Mil/uL — AB (ref 4.22–5.81)
RDW: 13.9 % (ref 11.5–15.5)
WBC: 6.7 10*3/uL (ref 4.0–10.5)

## 2016-12-29 LAB — IBC PANEL
Iron: 64 ug/dL (ref 42–165)
SATURATION RATIOS: 24.2 % (ref 20.0–50.0)
Transferrin: 189 mg/dL — ABNORMAL LOW (ref 212.0–360.0)

## 2016-12-29 LAB — BRAIN NATRIURETIC PEPTIDE: Pro B Natriuretic peptide (BNP): 185 pg/mL — ABNORMAL HIGH (ref 0.0–100.0)

## 2016-12-29 LAB — BASIC METABOLIC PANEL
BUN: 28 mg/dL — ABNORMAL HIGH (ref 6–23)
CO2: 33 mEq/L — ABNORMAL HIGH (ref 19–32)
CREATININE: 1.06 mg/dL (ref 0.40–1.50)
Calcium: 8.9 mg/dL (ref 8.4–10.5)
Chloride: 106 mEq/L (ref 96–112)
GFR: 70.44 mL/min (ref >60.00)
GLUCOSE: 93 mg/dL (ref 70–99)
Potassium: 3.6 mEq/L (ref 3.5–5.1)
SODIUM: 144 meq/L (ref 135–145)

## 2016-12-29 LAB — VITAMIN D 25 HYDROXY (VIT D DEFICIENCY, FRACTURES): VITD: 34.86 ng/mL (ref 30.00–100.00)

## 2016-12-29 LAB — TSH: TSH: 1.83 u[IU]/mL (ref 0.35–4.50)

## 2016-12-29 NOTE — Progress Notes (Signed)
Subjective:    Patient ID: [name] [name], male    DOB: 02-24-1929, 83 y.o.   MRN: [id number]  HPI  The state of at least three ongoing medical problems is addressed today, with interval history of each noted here: Anemia: he denies BRBPR Hypocalcemia was noted in the hospital: he denies cramps Pt was recently in the hospital with pneumonia: he denies fever.   Past Medical History:  Diagnosis Date  . ANEMIA, IRON DEFICIENCY 10/13/2008  . ANXIETY 09/04/2008  . Aortic valvular disorder 08/16/2012   echo EF 45-50% LV mildly reduce,Biocor mitral valve,mild to mod. tricuspid regrug.,mild to mod. aortic regrug.  . Aortic valvular stenosis 11/19/2013  . ATRIAL FIBRILLATION, CHRONIC 01/14/2008   on warfarin since 2007  . BARRETTS ESOPHAGUS 03/20/2009  . BASAL CELL CARCINOMA OF SKIN SITE UNSPECIFIED 09/28/2010  . Cardiomyopathy, ischemic 11/19/2013   EF 45-50% with inferior wall hypokinesis by echo 2014  . CATARACTS, BILATERAL, HX OF 01/14/2008  . CEREBROVASCULAR ACCIDENT, HX OF 08/18/2008  . CHF (congestive heart failure) ([place])   . COAGULOPATHY, COUMADIN-INDUCED 01/14/2008  . COLONIC POLYPS, ADENOMATOUS 01/14/2008  . CORONARY ARTERY DISEASE 06/01/2007   Cath 04/03/2008; Cath 11/01/1990  . Dysrhythmia    HX OF T[place]Y BRADY  . Edema, peripheral 07/30/2009   LEV doppler- no thrombus or thrombophlebitis  . Elevated PSA   . GASTRIC POLYP 05/13/2009  . GERD 06/01/2007  . Hyperglycemia   . HYPERLIPIDEMIA 06/01/2007  . HYPERTENSION 06/01/2007  . HYPERTHYROIDISM 01/06/2011  . Mitral regurgitation 06/13/2008   mitral valve replacement #20 St. Jude Biocor; Maze procedure by Dr. Amador Cunas  at [facility]  . Mitral stenosis    Porcine bioprosthetic tissue valve placed 06/13/2008  . Mitral valve disorder 09/08/2011   echo EF 50-55% LV normal  . Pacemaker   . Peripheral artery disease ([place]) 06/16/2010   LEA doppler-left ABI nml 0.61 calcified vessels;right ABI  0.68  . PERSONAL HX COLONIC POLYPS 01/01/2010  . PLEURAL  EFFUSION, LEFT 10/13/2008  . PROSTATE CANCER, HX OF 01/14/2008  . Prosthetic valve dysfunction    Mitral stenosis involving bioprosthetic tissue valve placed 06/13/2008  . Restless leg syndrome   . S/P CABG x 1 06/13/2008   SVG to LAD with open vein harvest right thigh, LIMA harvested but not utilized by Dr Amador Cunas  . S/P Maze operation for atrial fibrillation 06/13/2008   Partial left atrial lesion set using cryothermy for bilateral pulmonary vein isolation by Dr Amador Cunas  . S/P mitral valve replacement with bioprosthetic valve 06/13/2008   50mm St Jude Biocor porcine bioprosthetic tissue valve by Dr Amador Cunas  . 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 Tachy-brady syndrome ([place]) 07/13/2008   medtronic Adapta gen. model ADDR01 ,serial # [id number] by Dr  [name] at [facility]  . UNSPECIFIED VENOUS INSUFFICIENCY 09/18/2008    Past Surgical History:  Procedure Laterality Date  . CARDIAC CATHETERIZATION  11/01/1990  . CARDIOVASCULAR STRESS TEST  06/03/2010   Persantine perfusion EF 45% mild to moderate ischemia basal and mid inferolateral region  . CARDIOVASCULAR STRESS TEST  01/01/2008   left ventricle normal,no significant ischemia  . CARDIOVASCULAR STRESS TEST  09/09/2005    possibility of ischemia inferior wall  . COLONOSCOPY  07/14/2009   diverticulosis, internal hemorrhoids  . CORONARY ARTERY BYPASS GRAFT  06/13/2008   graft x1 w/vein graft LAD artery by Dr. Amador Cunas at [facility]  .  CORONARY STENT PLACEMENT  03/24/2014   DES to LAD  . CYSTECTOMY     Left leg  . INTRAOPERATIVE TRANSESOPHAGEAL ECHOCARDIOGRAM N/A 04/22/2014   Procedure: INTRAOPERATIVE TRANSTHORACIC ECHOCARDIOGRAM;  Surgeon: [name], MD;  Location: [facility];  Service: Open Heart Surgery;  Laterality: N/A;  . LEFT AND RIGHT HEART CATHETERIZATION WITH CORONARY ANGIOGRAM N/A 01/08/2014   Procedure: LEFT AND RIGHT  HEART CATHETERIZATION WITH CORONARY ANGIOGRAM;  Surgeon: Sanda Klein, MD;  Location: [place] CATH LAB;  Service: Cardiovascular;  Laterality: N/A;  . MITRAL VALVE REPLACEMENT  06/13/2008   #20 St. Jude Bicor mitral valve ;Maze procedure at [facility] by Dr. Amador Cunas  . PACEMAKER PLACEMENT  07/13/2008   [place] dual chamber-Medtronic at [facility]   . PERCUTANEOUS CORONARY ROTOBLATOR INTERVENTION (PCI-R) N/A 03/24/2014   Procedure: PERCUTANEOUS CORONARY ROTOBLATOR INTERVENTION (PCI-R);  Surgeon: Blane Ohara, MD;  Location[facility];  Service: Cardiovascular;  Laterality: N/A;  . PROSTATECTOMY    . SKIN CANCER EXCISION     removed from forehead and right leg, nose/face  . TEE WITHOUT CARDIOVERSION N/A 01/22/2014   Procedure: TRANSESOPHAGEAL ECHOCARDIOGRAM (TEE);  Surgeon: Sanda Klein, MD;  Location: [facility] ENDOSCOPY;  Service: Cardiovascular;  Laterality: N/A;  . TONSILLECTOMY    . TRANSCATHETER AORTIC VALVE REPLACEMENT, TRANSFEMORAL N/A 04/22/2014   Procedure: TRANSCATHETER AORTIC VALVE REPLACEMENT, TRANSFEMORAL;  Surgeon: [name], MD;  Location: [facility];  Service: Open Heart Surgery;  Laterality: N/A;  TAVR-TF (RIGHT SIDE)  . UPPER GASTROINTESTINAL ENDOSCOPY  07/14/2009   erosive reflux esopagitis, Barrett's esophagus    Social History   Social History  . Marital status: Married    Spouse name: N/A  . Number of children: 4  . Years of education: N/A   Occupational History  . Retired Retired   Social History Main Topics  . Smoking status: Former Smoker    Years: 2.00    Types: Pipe    Quit date: 11/07/1965  . Smokeless tobacco: Never Used     Comment: quit over 40 years ago, never smoked cigarettes  . Alcohol use No  . Drug use: No  . Sexual activity: Not on file   Other Topics Concern  . Not on file   Social History Narrative  . No narrative on file    Current Outpatient Prescriptions on File Prior to Visit  Medication Sig Dispense Refill  . clopidogrel (PLAVIX) 75 MG tablet  Take 1 tablet (75 mg total) by mouth daily with breakfast. 90 tablet 3  . furosemide (LASIX) 40 MG tablet Take 40mg  daily EXCEPT every Mon, Wed, Fri take 80mg  daily 180 tablet 1  . methimazole (TAPAZOLE) 5 MG tablet TAKE 1 TABLET (5 MG TOTAL) BY MOUTH EVERY MONDAY, WEDNESDAY, AND FRIDAY. 36 tablet 2  . Multiple Vitamin (MULTIVITAMIN WITH MINERALS) TABS Take 1 tablet by mouth daily. Centrum Silver    . pantoprazole (PROTONIX) 40 MG tablet Take 1 tablet (40 mg total) by mouth daily. (Patient taking differently: Take 40 mg by mouth 2 (two) times daily. ) 90 tablet 2  . simvastatin (ZOCOR) 40 MG tablet Take 1 tablet (40 mg total) by mouth at bedtime. 90 tablet 2  . Tamsulosin HCl (FLOMAX) 0.4 MG CAPS Take 0.4 mg by mouth every evening.     . ferrous sulfate 325 (65 FE) MG tablet Take 325 mg by mouth daily with breakfast.    . metoprolol succinate (TOPROL-XL) 50 MG 24 hr tablet Over the next 10 days, take 1 pill every other day,  then STOP (Patient not taking: Reported on 12/29/2016) 1 tablet 0   No current facility-administered medications on file prior to visit.     No Known Allergies  Family History  Problem Relation Age of Onset  . Breast cancer Mother   . Stroke Mother   . Transient ischemic attack Mother     BP 118/70   Pulse 68   Ht 5\' 10"  (1.778 m)   Wt 154 lb (69.9 kg)   SpO2 96%   BMI 22.10 kg/m   Review of Systems Denies hematuria and numbness    Objective:   Physical Exam Vital signs: see vs page Gen: elderly, frail, no distress LUNGS:  Clear to auscultation   Lab Results  Component Value Date   PTH 37 12/29/2016   CALCIUM 8.7 12/29/2016   CALCIUM 8.9 12/29/2016   CAION 1.20 04/22/2014   PHOS 3.7 05/21/2014   Lab Results  Component Value Date   WBC 6.7 12/29/2016   HGB 12.5 (L) 12/29/2016   HCT 37.1 (L) 12/29/2016   MCV 90.5 12/29/2016   PLT 185.0 12/29/2016       Assessment & Plan:  Pneumonia: clinically better: recheck CXR. Anemia: stable: we'll  follow Hypocalcemia, improved: we'll follow. CHF: I encouraged pt to take lasix as rx'ed.

## 2016-12-29 NOTE — Patient Instructions (Signed)
blood tests and a chest x-ray, are requested for you today.  We'll let you know about the results. Please come back for a follow-up appointment in 3 months.

## 2016-12-30 ENCOUNTER — Other Ambulatory Visit: Payer: Self-pay | Admitting: Cardiovascular Disease

## 2016-12-30 LAB — PTH, INTACT AND CALCIUM
Calcium: 8.7 mg/dL (ref 8.6–10.3)
PTH: 37 pg/mL (ref 14–64)

## 2016-12-30 MED ORDER — POTASSIUM CHLORIDE CRYS ER 20 MEQ PO TBCR: 20.0000 meq | EXTENDED_RELEASE_TABLET | Freq: Every day | ORAL | 3 refills | Status: DC

## 2016-12-30 NOTE — Telephone Encounter (Signed)
°*  STAT* If patient is at the pharmacy, call can be transferred to refill team.   1. Which medications need to be refilled? (please list name of each medication and dose if known) new prescription for Potassium  2. Which pharmacy/location (including street and city if local pharmacy) is medication to be sent to?[facility]  3. Do they need a 30 day or 90 day supply? 90 and refills

## 2016-12-30 NOTE — Telephone Encounter (Signed)
Rx(s) sent to pharmacy electronically.  

## 2017-01-02 ENCOUNTER — Other Ambulatory Visit: Payer: Self-pay

## 2017-01-02 NOTE — Telephone Encounter (Signed)
Received refill request from [facility] for Metoprolol Succ 50 mg. Per discharge instrustions (from November 2017) patient was told to discontinue. Metoprolol Succ was not removed from patient's list at that time. Called pt to verify. Pt is not taking requested medication at this time - per instructions from hospital dicharge. Record updated. Refill refused.

## 2017-02-01 ENCOUNTER — Ambulatory Visit: Payer: Medicare Other | Admitting: Endocrinology

## 2017-02-02 ENCOUNTER — Other Ambulatory Visit: Payer: Self-pay

## 2017-02-02 MED ORDER — SIMVASTATIN 40 MG PO TABS: 40.0000 mg | ORAL_TABLET | Freq: Every day | ORAL | 2 refills | Status: DC

## 2017-02-08 ENCOUNTER — Ambulatory Visit (INDEPENDENT_AMBULATORY_CARE_PROVIDER_SITE_OTHER): Payer: Medicare Other | Admitting: Podiatry

## 2017-02-08 ENCOUNTER — Encounter: Payer: Self-pay | Admitting: Podiatry

## 2017-02-08 DIAGNOSIS — M79676 Pain in unspecified toe(s): Secondary | ICD-10-CM | POA: Diagnosis not present

## 2017-02-08 DIAGNOSIS — B351 Tinea unguium: Secondary | ICD-10-CM | POA: Diagnosis not present

## 2017-02-08 NOTE — Progress Notes (Signed)
Complaint:  Visit Type: Patient returns to my office for continued preventative foot care services. Complaint: Patient states" my nails have grown long and thick and become painful to walk and wear shoes" Patient has been diagnosed with DM with no foot complications. The patient presents for preventative foot care services. No changes to ROS  Podiatric Exam: Vascular: dorsalis pedis and posterior tibial pulses are palpable bilateral. Capillary return is immediate. Temperature gradient is WNL. Skin turgor WNL  Sensorium: Normal Semmes Weinstein monofilament test. Normal tactile sensation bilaterally. Nail Exam: Pt has thick disfigured discolored nails with subungual debris noted bilateral entire nail hallux through fifth toenails Ulcer Exam: There is no evidence of ulcer or pre-ulcerative changes or infection. Orthopedic Exam: Muscle tone and strength are WNL. No limitations in general ROM. No crepitus or effusions noted. Foot type and digits show no abnormalities. Bony prominences are unremarkable. Skin: No Porokeratosis. No infection or ulcers  Diagnosis:  Onychomycosis, , Pain in right toe, pain in left toes  Treatment & Plan Procedures and Treatment: Consent by patient was obtained for treatment procedures. The patient understood the discussion of treatment and procedures well. All questions were answered thoroughly reviewed. Debridement of mycotic and hypertrophic toenails, 1 through 5 bilateral and clearing of subungual debris. No ulceration, no infection noted.  Return Visit-Office Procedure: Patient instructed to return to the office for a follow up visit 3 months for continued evaluation and treatment.    Wilmar Prabhakar DPM 

## 2017-02-22 LAB — BASIC METABOLIC PANEL
BUN: 26 mg/dL — AB (ref 7–25)
CALCIUM: 9.1 mg/dL (ref 8.6–10.3)
CO2: 24 mmol/L (ref 20–31)
CREATININE: 1.14 mg/dL — AB (ref 0.70–1.11)
Chloride: 105 mmol/L (ref 98–110)
GLUCOSE: 97 mg/dL (ref 65–99)
Potassium: 4.1 mmol/L (ref 3.5–5.3)
SODIUM: 143 mmol/L (ref 135–146)

## 2017-02-23 LAB — MAGNESIUM: MAGNESIUM: 2.3 mg/dL (ref 1.5–2.5)

## 2017-03-01 ENCOUNTER — Ambulatory Visit (INDEPENDENT_AMBULATORY_CARE_PROVIDER_SITE_OTHER): Payer: Medicare Other | Admitting: Cardiovascular Disease

## 2017-03-01 ENCOUNTER — Encounter: Payer: Self-pay | Admitting: Cardiovascular Disease

## 2017-03-01 VITALS — BP 120/80 | HR 76 | Ht 67.0 in | Wt 161.6 lb

## 2017-03-01 DIAGNOSIS — E0580 Other thyrotoxicosis without thyrotoxic crisis or storm: Secondary | ICD-10-CM

## 2017-03-01 DIAGNOSIS — Z950 Presence of cardiac pacemaker: Secondary | ICD-10-CM

## 2017-03-01 DIAGNOSIS — I5032 Chronic diastolic (congestive) heart failure: Secondary | ICD-10-CM

## 2017-03-01 DIAGNOSIS — E785 Hyperlipidemia, unspecified: Secondary | ICD-10-CM

## 2017-03-01 DIAGNOSIS — I4821 Permanent atrial fibrillation: Secondary | ICD-10-CM

## 2017-03-01 DIAGNOSIS — K449 Diaphragmatic hernia without obstruction or gangrene: Secondary | ICD-10-CM

## 2017-03-01 DIAGNOSIS — T462X5A Adverse effect of other antidysrhythmic drugs, initial encounter: Secondary | ICD-10-CM

## 2017-03-01 DIAGNOSIS — I10 Essential (primary) hypertension: Secondary | ICD-10-CM | POA: Diagnosis not present

## 2017-03-01 DIAGNOSIS — I482 Chronic atrial fibrillation: Secondary | ICD-10-CM | POA: Diagnosis not present

## 2017-03-01 DIAGNOSIS — Z9861 Coronary angioplasty status: Secondary | ICD-10-CM

## 2017-03-01 DIAGNOSIS — Z952 Presence of prosthetic heart valve: Secondary | ICD-10-CM

## 2017-03-01 DIAGNOSIS — T8203XD Leakage of heart valve prosthesis, subsequent encounter: Secondary | ICD-10-CM

## 2017-03-01 DIAGNOSIS — I2510 Atherosclerotic heart disease of native coronary artery without angina pectoris: Secondary | ICD-10-CM

## 2017-03-01 DIAGNOSIS — Z953 Presence of xenogenic heart valve: Secondary | ICD-10-CM | POA: Diagnosis not present

## 2017-03-01 LAB — CUP PACEART INCLINIC DEVICE CHECK
Battery Impedance: 3140 Ohm
Battery Remaining Longevity: 25 mo
Battery Voltage: 2.73 V
Date Time Interrogation Session: 20180425135124
Implantable Lead Implant Date: 20090906
Implantable Lead Location: 753860
Implantable Lead Model: 5076
Implantable Lead Model: 5076
Implantable Pulse Generator Implant Date: 20090906
Lead Channel Impedance Value: 67 Ohm
Lead Channel Pacing Threshold Amplitude: 0.75 V
Lead Channel Pacing Threshold Pulse Width: 0.4 ms
Lead Channel Setting Pacing Amplitude: 2.5 V
Lead Channel Setting Sensing Sensitivity: 4 mV
MDC IDC LEAD IMPLANT DT: 20090906
MDC IDC LEAD LOCATION: 753859
MDC IDC MSMT LEADCHNL RV IMPEDANCE VALUE: 717 Ohm
MDC IDC MSMT LEADCHNL RV PACING THRESHOLD AMPLITUDE: 1 V
MDC IDC MSMT LEADCHNL RV PACING THRESHOLD PULSEWIDTH: 0.4 ms
MDC IDC MSMT LEADCHNL RV SENSING INTR AMPL: 8 mV
MDC IDC SET LEADCHNL RV PACING PULSEWIDTH: 0.4 ms
MDC IDC STAT BRADY RV PERCENT PACED: 12 %

## 2017-03-01 NOTE — Patient Instructions (Signed)

## 2017-03-01 NOTE — Progress Notes (Signed)
Patient ID: [name] [name], male   DOB: 12-16-1928, 83 y.o.   MRN: [id number]    Cardiology Office Note    Date:  03/01/2017   ID:  [name] [name], DOB 1929-08-20, MRN [id number]  PCP:  [name], MD  Cardiologist:   Sanda Klein, MD   Chief Complaint  Patient presents with  . Follow-up  CHF, prosthetic valve function, atrial fibrillation, pacemaker  History of Present Illness:  [name] [name] is a 83 y.o. male with coronary, valvular and arrhythmic cardiac problems, here for follow-up. He was hospitalized in November with acute heart failure and again in February with right lung pneumonia.  Currently he feels well physically, but is mourning the loss of his son who died after bilateral with kidney cancer.   He is able to lie completely supine in bed. He does not have leg edema. He is NYHA functional class II status. He also has severe kyphoscoliosis and a very large diaphragmatic hernia that contribute to his shortness of breath. He has chronic hoarseness ever since he last received an NG tube. He has seen an ear nose and throat specialist.  He has normal pacemaker function and only has 12% ventricular pacing (after we added hysteresis to 50 bpm). His dual-chamber device (Medtronic Adapta 2009) is now programmed VVIR for permanent atrial fibrillation and generator voltage shows that it has 25 months of expected longevity. Episodes of high ventricular rates are rare.  He has not had any falls since his last appointment but is very careful with position changes. He has not had serious bleeding problems. He continues to take clopidogrel monotherapy after he had severe GI bleeding on full dose anticoagulants.  His most recent echocardiogram from February 2018 shows normal left ventricular systolic function with ejection fraction of around 55%, mild perivalvular (TAVR) aortic insufficiency and stable prosthetic gradients (mitral valve mean gradient 5 mmHg, TAVR mean grad 5 mm Hg). The left  atrium is severely dilated. Estimated systolic PA pressure was 53 mmHg, based on an estimated RA pressure of 15 mmHg.  It has been a couple of years now since he stopped his amiodarone and we decided to allow permanent atrial fibrillation. If anything his functional status has improved since that time.  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  His cardiac problems date back to 2009 when he was diagnosed with severe mitral insufficiency related to mitral valve prolapse. He was also having problems with paroxysmal atrial fibrillation. Coronary angiography demonstrated moderate coronary artery disease primarily a 70% calcified eccentric proximal LAD lesion with moderate stenoses in the other 2 major coronary arteries. He was referred for robotic mitral valve repair by Dr. Amador Cunas at [facility]. The LIMA was found to be a very small vessel. He received a saphenous vein graft bypass to the LAD. Cryoablation was performed for atrial fibrillation. The mitral valve was replaced with a 29 mm St. Jude Biocor prosthesis. The postoperative course was complicated by an embolic stroke from which she has recovered without sequelae. He received a dual-chamber permanent pacemaker (Medtronic adapta) and was pacemaker dependent with 100% pacing in both the atrium and the ventricle, though he has subsequently regained AV conduction at least partly. Subsequently, he has developed progressive CAD and aortic valve disease and underwent rotational atherectomy and stenting of the proximal LAD on 03/24/2014 (3 x 8 mm Xience Alpine drug-eluting stent) and then TAVR (29 mm Edwards Sapien) on  April 22, 2014.  Left ventricular systolic function was mildly depressed with an EF of 45-50% primarily due to RV pacing induced asynchrony, but follow-up echo in May 2016 suggested EF dropped to 35-40 percent. The most recent echocardiogram in November 2016 shows normal  left ventricular ejection fraction of 60%. This last echo showed that the aortic valve insufficiency was only trivial. 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 This prevents imaging of his heart from transesophageal views  He has mild hyperthyroidism that is well controlled on low dose of methimazole. 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 He has gastroesophageal reflux disease and Barrett's esophagus. He has extensive sigmoid and transverse colonic diverticulosis. One month following aortic stent valve replacement he had diverticular bleeding requiring angiographic coil embolization.   Past Medical History:  Diagnosis Date  . ANEMIA, IRON DEFICIENCY 10/13/2008  . ANXIETY 09/04/2008  . Aortic valvular disorder 08/16/2012   echo EF 45-50% LV mildly reduce,Biocor mitral valve,mild to mod. tricuspid regrug.,mild to mod. aortic regrug.  . Aortic valvular stenosis 11/19/2013  . ATRIAL FIBRILLATION, CHRONIC 01/14/2008   on warfarin since 2007  . BARRETTS ESOPHAGUS 03/20/2009  . BASAL CELL CARCINOMA OF SKIN SITE UNSPECIFIED 09/28/2010  . Cardiomyopathy, ischemic 11/19/2013   EF 45-50% with inferior wall hypokinesis by echo 2014  . CATARACTS, BILATERAL, HX OF 01/14/2008  . CEREBROVASCULAR ACCIDENT, HX OF 08/18/2008  . CHF (congestive heart failure) ([place])   . COAGULOPATHY, COUMADIN-INDUCED 01/14/2008  . COLONIC POLYPS, ADENOMATOUS 01/14/2008  . CORONARY ARTERY DISEASE 06/01/2007   Cath 04/03/2008; Cath 11/01/1990  . Dysrhythmia    HX OF [place]  . Edema, peripheral 07/30/2009   LEV doppler- no thrombus or thrombophlebitis  . Elevated PSA   . GASTRIC POLYP 05/13/2009  . GERD 06/01/2007  . Hyperglycemia   . HYPERLIPIDEMIA 06/01/2007  . HYPERTENSION 06/01/2007  . HYPERTHYROIDISM 01/06/2011  . Mitral regurgitation 06/13/2008   mitral valve replacement #20 St. Jude Biocor; Maze procedure by Dr. Amador Cunas  at [facility]  . Mitral stenosis    Porcine bioprosthetic  tissue valve placed 06/13/2008  . Mitral valve disorder 09/08/2011   echo EF 50-55% LV normal  . Pacemaker   . Peripheral artery disease ([place]) 06/16/2010   LEA doppler-left ABI nml 0.61 calcified vessels;right ABI  0.68  . PERSONAL HX COLONIC POLYPS 01/01/2010  . PLEURAL EFFUSION, LEFT 10/13/2008  . PROSTATE CANCER, HX OF 01/14/2008  . Prosthetic valve dysfunction    Mitral stenosis involving bioprosthetic tissue valve placed 06/13/2008  . Restless leg syndrome   . S/P CABG x 1 06/13/2008   SVG to LAD with open vein harvest right thigh, LIMA harvested but not utilized by Dr Amador Cunas  . S/P Maze operation for atrial fibrillation 06/13/2008   Partial left atrial lesion set using cryothermy for bilateral pulmonary vein isolation by Dr Amador Cunas  . S/P mitral valve replacement with bioprosthetic valve 06/13/2008   71mm St Jude Biocor porcine bioprosthetic tissue valve by Dr Amador Cunas  . 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 Tachy-brady syndrome ([place]) 07/13/2008   medtronic Adapta gen. model ADDR01 ,serial # [id number] by Dr  [name] at [facility]  . UNSPECIFIED VENOUS INSUFFICIENCY 09/18/2008    Past Surgical History:  Procedure Laterality Date  . CARDIAC CATHETERIZATION  11/01/1990  . CARDIOVASCULAR STRESS TEST  06/03/2010   Persantine perfusion  EF 45% mild to moderate ischemia basal and mid inferolateral region  . CARDIOVASCULAR STRESS TEST  01/01/2008   left ventricle normal,no significant ischemia  . CARDIOVASCULAR STRESS TEST  09/09/2005    possibility of ischemia inferior wall  . COLONOSCOPY  07/14/2009   diverticulosis, internal hemorrhoids  . CORONARY ARTERY BYPASS GRAFT  06/13/2008   graft x1 w/vein graft LAD artery by Dr. [facility]ador [facility] at [facility]  . CORONARY STENT PLACEMENT  03/24/2014   DES to LAD  . CYSTECTOMY     Left leg  . INTRAOPERATIVE TRANSESOPHAGEAL ECHOCARDIOGRAM  N/A 04/22/2014   Procedure: INTRAOPERATIVE TRANSTHORACIC ECHOCARDIOGRAM;  Surgeon: [name], MD;  Location: [place];  Service: Open Heart Surgery;  Laterality: N/A;  . LEFT AND RIGHT HEART CATHETERIZATION WITH CORONARY ANGIOGRAM N/A 01/08/2014   Procedure: LEFT AND RIGHT HEART CATHETERIZATION WITH CORONARY ANGIOGRAM;  Surgeon: Sanda Klein, MD;  Location: [place] CATH LAB;  Service: Cardiovascular;  Laterality: N/A;  . MITRAL VALVE REPLACEMENT  06/13/2008   #20 St. Jude Bicor mitral valve ;Maze procedure at [facility] by Dr. Amador Cunas  . PACEMAKER PLACEMENT  07/13/2008   [place] dual chamber-Medtronic at [facility]   . PERCUTANEOUS CORONARY ROTOBLATOR INTERVENTION (PCI-R) N/A 03/24/2014   Procedure: PERCUTANEOUS CORONARY ROTOBLATOR INTERVENTION (PCI-R);  Surgeon: Blane Ohara, MD;  Location: [facility] CATH LAB;  Service: Cardiovascular;  Laterality: N/A;  . PROSTATECTOMY    . SKIN CANCER EXCISION     removed from forehead and right leg, nose/face  . TEE WITHOUT CARDIOVERSION N/A 01/22/2014   Procedure: TRANSESOPHAGEAL ECHOCARDIOGRAM (TEE);  Surgeon: Sanda Klein, MD;  Location: [facility] ENDOSCOPY;  Service: Cardiovascular;  Laterality: N/A;  . TONSILLECTOMY    . TRANSCATHETER AORTIC VALVE REPLACEMENT, TRANSFEMORAL N/A 04/22/2014   Procedure: TRANSCATHETER AORTIC VALVE REPLACEMENT, TRANSFEMORAL;  Surgeon: [name], MD;  Location: [place];  Service: Open Heart Surgery;  Laterality: N/A;  TAVR-TF (RIGHT SIDE)  . UPPER GASTROINTESTINAL ENDOSCOPY  07/14/2009   erosive reflux esopagitis, Barrett's esophagus    Current Medications: Outpatient Medications Prior to Visit  Medication Sig Dispense Refill  . clopidogrel (PLAVIX) 75 MG tablet Take 1 tablet (75 mg total) by mouth daily with breakfast. 90 tablet 3  . furosemide (LASIX) 40 MG tablet Take 40mg  daily EXCEPT every Mon, Wed, Fri take 80mg  daily 180 tablet 1  . methimazole (TAPAZOLE) 5 MG tablet TAKE 1 TABLET (5 MG TOTAL) BY MOUTH EVERY MONDAY, WEDNESDAY, AND FRIDAY.  36 tablet 2  . Multiple Vitamin (MULTIVITAMIN WITH MINERALS) TABS Take 1 tablet by mouth daily. Centrum Silver    . pantoprazole (PROTONIX) 40 MG tablet Take 1 tablet (40 mg total) by mouth daily. (Patient taking differently: Take 40 mg by mouth 2 (two) times daily. ) 90 tablet 2  . simvastatin (ZOCOR) 40 MG tablet Take 1 tablet (40 mg total) by mouth at bedtime. 90 tablet 2  . Tamsulosin HCl (FLOMAX) 0.4 MG CAPS Take 0.4 mg by mouth every evening.     . potassium chloride SA (KLOR-CON M20) 20 MEQ tablet Take 1 tablet (20 mEq total) by mouth daily. 90 tablet 3  . ferrous sulfate 325 (65 FE) MG tablet Take 325 mg by mouth daily with breakfast.     No facility-administered medications prior to visit.      Allergies:   Patient has no known allergies.   Social History   Social History  . Marital status: Married    Spouse name: N/A  . Number of children: 4  . Years of education: N/A  Occupational History  . Retired Retired   Social History Main Topics  . Smoking status: Former Smoker    Years: 2.00    Types: Pipe    Quit date: 11/07/1965  . Smokeless tobacco: Never Used     Comment: quit over 40 years ago, never smoked cigarettes  . Alcohol use No  . Drug use: No  . Sexual activity: Not Asked   Other Topics Concern  . None   Social History Narrative  . None     Family History:  The patient's family history includes Breast cancer in his mother; Stroke in his mother; Transient ischemic attack in his mother.   ROS:   Please see the history of present illness.    ROS All other systems reviewed and are negative.   PHYSICAL EXAM:   VS:  BP 120/80   Pulse 76   Ht 5\' 7"  (1.702 m)   Wt 73.3 kg (161 lb 9.6 oz)   BMI 25.31 kg/m   RR 18 rpm. This is an updated physical exam performed today 03/01/2017 GEN: Well nourished, well developed, in no acute distress HEENT: normal  Neck: no carotid bruits, or masses. Jugular venous pulsations are at 2-3 cm, butthere are prominent V  waves Cardiac: Normal S1-wide split S2, irregular rhythm;  grade 1-2/6 aortic ejection murmur is early peaking, there is a 2/6 crescendo diastolic murmur heard especially well at the left lower sternal border, no rubs, or gallops, no edema ; Healthy subclavian pacemaker site  Respiratory:  clear to auscultation bilaterally, normal work of breathing. Very prominent thoracic kyphosis GI: soft, nontender, nondistended, + BS MS: no deformity or atrophy  Skin: warm and dry, no rash Neuro:  Alert and Oriented x 3, Strength and sensation are intact Psych: euthymic mood, full affect  Wt Readings from Last 3 Encounters:  03/01/17 73.3 kg (161 lb 9.6 oz)  12/29/16 69.9 kg (154 lb)  12/21/16 66.7 kg (147 lb)      Studies/Labs Reviewed:   EKG:  EKG is not ordered today.   Recent Labs: 12/17/2016: B Natriuretic Peptide 392.0 12/29/2016: Hemoglobin 12.5; Platelets 185.0; Pro B Natriuretic peptide (BNP) 185.0; TSH 1.83 02/22/2017: BUN 26; Creat 1.14; Magnesium 2.3; Potassium 4.1; Sodium 143   Lipid Panel    Component Value Date/Time   CHOL 120 03/14/2016 1605   TRIG 48.0 03/14/2016 1605   HDL 42.60 03/14/2016 1605   CHOLHDL 3 03/14/2016 1605   VLDL 9.6 03/14/2016 1605   LDLCALC 68 03/14/2016 1605      ASSESSMENT:    1. Chronic diastolic heart failure ([place])   2. Permanent atrial fibrillation ([place])   3. Pacemaker   4. S/P mitral valve replacement with bioprosthetic valve   5. S/P TAVR (transcatheter aortic valve replacement)   6. Perivalvular leak of prosthetic heart valve, subsequent encounter   7. CAD S/P percutaneous coronary angioplasty -    8. Diaphragmatic hernia without obstruction and without gangrene   9. Essential hypertension   10. 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 Chronic diastolic heart failure -  He is self-regulating his diuretic dose very effectively. Generally alternates between 40 and 80 mg of  furosemide daily. Staying within target "dry weight" under 164 pounds on our office scale. 2. Persistent atrial fibrillation  He is not on anticoagulation due to GI bleeding complications.  Amiodarone stopped due to thyrotoxicosis. Seems to be doing fine  and atrial fibrillation, without his "atrial kick". No plan for other antiarrhythmic therapy. Rate control is acceptable. Avoid excessive medications that could promote bradycardia, since it appears that ventricular pacing may have led to heart failure decompensation. 3. Normal function of permanent pacemaker, programmed VVI ; Continue remote checks every 3 months. Try to limit ventricular pacing, probable cause of heart failure decompensation in the past. 4. Status post mitral valve biological prosthesis with normal function 5. Status post TAVR with normal gradients 6. Mild valvular aortic insufficiency by most recent echocardiogram, not clinically important at this time. Murmur is quite obvious, but not really changed from previous exam. Decrease echo evaluation to once yearly. 7. CAD status post drug-eluting stent to proximal LAD artery June 2016. Plan to continue the clopidogrel indefinitely, unless bleeding becomes a more serious problem. He does not have angina pectoris 8. Very large diaphragmatic hernia with gastric and colon contents in the thoracic cavity. Together with his prominent thoracic kyphosis I believe these are contributing in large measure to his dyspnea, and may be contributing to his pulmonary hypertension via restrictive lung disease. 9. Hypertension with excellent control  10. Hyperlipidemia with excellent LDL cholesterol; continue statin 11. Hyperthyroidism, possibly amiodarone related, has been euthyroid for a long time. Latest labs from Feb 22 showed normal TSH. Defer continued anti-thyroid therapy to his endocrinologist  12. Falls: No new falls since his last appointment. Reminded will place to avoid falls and ensure  safety.   Medication Adjustments/Labs and Tests Ordered: Current medicines are reviewed at length with the patient today.  Concerns regarding medicines are outlined above.  Medication changes, Labs and Tests ordered today are listed in the Patient Instructions below. Patient Instructions  Dr [name] recommends that you continue on your current medications as directed. Please refer to the Current Medication list given to you today.  Remote monitoring is used to monitor your Pacemaker or ICD from home. This monitoring reduces the number of office visits required to check your device to one time per year. It allows [place] to keep an eye on the functioning of your device to ensure it is working properly. You are scheduled for a device check from home on Wednesday, July 25th, 2018. You may send your transmission at any time that day. If you have a wireless device, the transmission will be sent automatically. After your physician reviews your transmission, you will receive a notification with your next transmission date.  Dr [name] recommends that you schedule a follow-up appointment in 12 months with a pacemaker check. You will receive a reminder letter in the mail two months in advance. If you don't receive a letter, please call our office to schedule the follow-up appointment.  If you need a refill on your cardiac medications before your next appointment, please call your pharmacy.    Signed, Sanda Klein, MD  03/01/2017 5:54 PM    [address], [place], [place]  [address] Phone: [phone]; Fax: [phone]

## 2017-03-29 ENCOUNTER — Ambulatory Visit (INDEPENDENT_AMBULATORY_CARE_PROVIDER_SITE_OTHER): Payer: Medicare Other | Admitting: Endocrinology

## 2017-03-29 ENCOUNTER — Encounter: Payer: Self-pay | Admitting: Endocrinology

## 2017-03-29 VITALS — BP 122/64 | HR 107 | Ht 67.0 in | Wt 163.0 lb

## 2017-03-29 DIAGNOSIS — I482 Chronic atrial fibrillation: Secondary | ICD-10-CM | POA: Diagnosis not present

## 2017-03-29 DIAGNOSIS — Z0000 Encounter for general adult medical examination without abnormal findings: Secondary | ICD-10-CM

## 2017-03-29 DIAGNOSIS — E0580 Other thyrotoxicosis without thyrotoxic crisis or storm: Secondary | ICD-10-CM

## 2017-03-29 NOTE — Progress Notes (Signed)
Subjective:    Patient ID: [name] [name], male    DOB: 14-Jul-1929, 83 y.o.   MRN: [id number]  HPI Pt returns for f/u of hyperthyroidism (due to amiodarone; dx'ed 2012; he has been well-controlled on tapazole; he has never had dedicated thyroid imaging).  He is now off amiodarone.  pt states he feels well in general. Past Medical History:  Diagnosis Date  . ANEMIA, IRON DEFICIENCY 10/13/2008  . ANXIETY 09/04/2008  . Aortic valvular disorder 08/16/2012   echo EF 45-50% LV mildly reduce,Biocor mitral valve,mild to mod. tricuspid regrug.,mild to mod. aortic regrug.  . Aortic valvular stenosis 11/19/2013  . ATRIAL FIBRILLATION, CHRONIC 01/14/2008   on warfarin since 2007  . BARRETTS ESOPHAGUS 03/20/2009  . BASAL CELL CARCINOMA OF SKIN SITE UNSPECIFIED 09/28/2010  . Cardiomyopathy, ischemic 11/19/2013   EF 45-50% with inferior wall hypokinesis by echo 2014  . CATARACTS, BILATERAL, HX OF 01/14/2008  . CEREBROVASCULAR ACCIDENT, HX OF 08/18/2008  . CHF (congestive heart failure) ([place])   . COAGULOPATHY, COUMADIN-INDUCED 01/14/2008  . COLONIC POLYPS, ADENOMATOUS 01/14/2008  . CORONARY ARTERY DISEASE 06/01/2007   Cath 04/03/2008; Cath 11/01/1990  . Dysrhythmia    HX OF [place]  . Edema, peripheral 07/30/2009   LEV doppler- no thrombus or thrombophlebitis  . Elevated PSA   . GASTRIC POLYP 05/13/2009  . GERD 06/01/2007  . Hyperglycemia   . HYPERLIPIDEMIA 06/01/2007  . HYPERTENSION 06/01/2007  . HYPERTHYROIDISM 01/06/2011  . Mitral regurgitation 06/13/2008   mitral valve replacement #20 St. Jude Biocor; Maze procedure by Dr. Amador Cunas  at [facility]  . Mitral stenosis    Porcine bioprosthetic tissue valve placed 06/13/2008  . Mitral valve disorder 09/08/2011   echo EF 50-55% LV normal  . Pacemaker   . Peripheral artery disease ([place]) 06/16/2010   LEA doppler-left ABI nml 0.61 calcified vessels;right ABI  0.68  . PERSONAL HX COLONIC POLYPS 01/01/2010  . PLEURAL EFFUSION, LEFT 10/13/2008  . PROSTATE CANCER, HX  OF 01/14/2008  . Prosthetic valve dysfunction    Mitral stenosis involving bioprosthetic tissue valve placed 06/13/2008  . Restless leg syndrome   . S/P CABG x 1 06/13/2008   SVG to LAD with open vein harvest right thigh, LIMA harvested but not utilized by Dr Amador Cunas  . S/P Maze operation for atrial fibrillation 06/13/2008   Partial left atrial lesion set using cryothermy for bilateral pulmonary vein isolation by Dr Amador Cunas  . S/P mitral valve replacement with bioprosthetic valve 06/13/2008   17mm St Jude Biocor porcine bioprosthetic tissue valve by Dr Amador Cunas  . 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 Tachy-brady syndrome ([place]) 07/13/2008   medtronic Adapta gen. model ADDR01 ,serial # [id number] by Dr  [name] at [facility]  . UNSPECIFIED VENOUS INSUFFICIENCY 09/18/2008    Past Surgical History:  Procedure Laterality Date  . CARDIAC CATHETERIZATION  11/01/1990  . CARDIOVASCULAR STRESS TEST  06/03/2010   Persantine perfusion EF 45% mild to moderate ischemia basal and mid inferolateral region  . CARDIOVASCULAR STRESS TEST  01/01/2008   left ventricle normal,no significant ischemia  . CARDIOVASCULAR STRESS TEST  09/09/2005    possibility of ischemia inferior wall  . COLONOSCOPY  07/14/2009   diverticulosis, internal hemorrhoids  . CORONARY ARTERY BYPASS GRAFT  06/13/2008   graft x1 w/vein graft LAD artery by Dr. Amador Cunas at [facility]  . CORONARY STENT PLACEMENT  03/24/2014  DES to LAD  . CYSTECTOMY     Left leg  . INTRAOPERATIVE TRANSESOPHAGEAL ECHOCARDIOGRAM N/A 04/22/2014   Procedure: INTRAOPERATIVE TRANSTHORACIC ECHOCARDIOGRAM;  Surgeon: [name], MD;  Location: [place];  Service: Open Heart Surgery;  Laterality: N/A;  . LEFT AND RIGHT HEART CATHETERIZATION WITH CORONARY ANGIOGRAM N/A 01/08/2014   Procedure: LEFT AND RIGHT HEART CATHETERIZATION WITH CORONARY ANGIOGRAM;   Surgeon: Sanda Klein, MD;  Location: [place] CATH LAB;  Service: Cardiovascular;  Laterality: N/A;  . MITRAL VALVE REPLACEMENT  06/13/2008   #20 St. Jude Bicor mitral valve ;Maze procedure at [facility] by Dr. Amador Cunas  . PACEMAKER PLACEMENT  07/13/2008   [place] dual chamber-Medtronic at [facility]   . PERCUTANEOUS CORONARY ROTOBLATOR INTERVENTION (PCI-R) N/A 03/24/2014   Procedure: PERCUTANEOUS CORONARY ROTOBLATOR INTERVENTION (PCI-R);  Surgeon: Blane Ohara, MD;  Location: [facility] CATH LAB;  Service: Cardiovascular;  Laterality: N/A;  . PROSTATECTOMY    . SKIN CANCER EXCISION     removed from forehead and right leg, nose/face  . TEE WITHOUT CARDIOVERSION N/A 01/22/2014   Procedure: TRANSESOPHAGEAL ECHOCARDIOGRAM (TEE);  Surgeon: Sanda Klein, MD;  Location: [facility] ENDOSCOPY;  Service: Cardiovascular;  Laterality: N/A;  . TONSILLECTOMY    . TRANSCATHETER AORTIC VALVE REPLACEMENT, TRANSFEMORAL N/A 04/22/2014   Procedure: TRANSCATHETER AORTIC VALVE REPLACEMENT, TRANSFEMORAL;  Surgeon: [name], MD;  Location: [place];  Service: Open Heart Surgery;  Laterality: N/A;  TAVR-TF (RIGHT SIDE)  . UPPER GASTROINTESTINAL ENDOSCOPY  07/14/2009   erosive reflux esopagitis, Barrett's esophagus    Social History   Social History  . Marital status: Married    Spouse name: N/A  . Number of children: 4  . Years of education: N/A   Occupational History  . Retired Retired   Social History Main Topics  . Smoking status: Former Smoker    Years: 2.00    Types: Pipe    Quit date: 11/07/1965  . Smokeless tobacco: Never Used     Comment: quit over 40 years ago, never smoked cigarettes  . Alcohol use No  . Drug use: No  . Sexual activity: Not on file   Other Topics Concern  . Not on file   Social History Narrative  . No narrative on file    Current Outpatient Prescriptions on File Prior to Visit  Medication Sig Dispense Refill  . clopidogrel (PLAVIX) 75 MG tablet Take 1 tablet (75 mg total) by mouth daily with  breakfast. 90 tablet 3  . furosemide (LASIX) 40 MG tablet Take 40mg  daily EXCEPT every Mon, Wed, Fri take 80mg  daily 180 tablet 1  . Multiple Vitamin (MULTIVITAMIN WITH MINERALS) TABS Take 1 tablet by mouth daily. Centrum Silver    . pantoprazole (PROTONIX) 40 MG tablet Take 1 tablet (40 mg total) by mouth daily. (Patient taking differently: Take 40 mg by mouth 2 (two) times daily. ) 90 tablet 2  . simvastatin (ZOCOR) 40 MG tablet Take 1 tablet (40 mg total) by mouth at bedtime. 90 tablet 2  . Tamsulosin HCl (FLOMAX) 0.4 MG CAPS Take 0.4 mg by mouth every evening.     . potassium chloride SA (KLOR-CON M20) 20 MEQ tablet Take 1 tablet (20 mEq total) by mouth daily. 90 tablet 3   No current facility-administered medications on file prior to visit.     No Known Allergies  Family History  Problem Relation Age of Onset  . Breast cancer Mother   . Stroke Mother   . Transient ischemic attack Mother  BP 122/64   Pulse (!) 107   Ht 5\' 7"  (1.702 m)   Wt 163 lb (73.9 kg)   SpO2 93%   BMI 25.53 kg/m    Review of Systems Denies fever    Objective:   Physical Exam VITAL SIGNS:  See vs page GENERAL: no distress NECK: There is no palpable thyroid enlargement.  No thyroid nodule is palpable.  No palpable lymphadenopathy at the anterior neck. Skin: not diaphoretic Neuro: no tremor Gait: slow but steady.    Lab Results  Component Value Date   TSH 1.83 12/29/2016      Assessment & Plan:  Hyperthyroidism. He can have a trial off amiodarone AF: better with rx.    Subjective:   Patient here for Medicare annual wellness visit and management of other chronic and acute problems.     Risk factors: advanced age    46 of Physicians Providing Medical Care to Patient:  See "snapshot"   Activities of Daily Living: In your present state of health, do you have any difficulty performing the following activities (lives at [place] with wife)?:  Preparing food and eating?: No  Bathing  yourself: No  Getting dressed: No  Using the toilet:No  Moving around from place to place: No  In the past year have you fallen or had a near fall?: No    Home Safety: Has smoke detector and wears seat belts. No firearms. No excess sun exposure.  Diet and Exercise  Current exercise habits: limited by advanced age Dietary issues discussed: pt reports healthy diet   Depression Screen  Q1: Over the past two weeks, have you felt down, depressed or hopeless? no  Q2: Over the past two weeks, have you felt little interest or pleasure in doing things? no   The following portions of the patient's history were reviewed and updated as appropriate: allergies, current medications, past family history, past medical history, past social history, past surgical history and problem list.   Review of Systems  Denies hearing loss, and visual loss.   Objective:   Vision:  Sees opthalmologist Hearing: grossly normal Body mass index:  See vs page Msk: pt easily and quickly performs "get-up-and-go" from a sitting position.   Cognitive Impairment Assessment: cognition, memory and judgment appear normal.  remembers 2/3 at 5 minutes.  excellent recall.  can easily read and write a sentence.  alert and oriented x 3   Assessment:   Medicare wellness utd on preventive parameters    Plan:   During the course of the visit the patient was educated and counseled about appropriate screening and preventive services including:       Fall prevention    Diabetes screening  Nutrition counseling   Vaccines / LABS Zostavax / Pneumococcal Vaccine  today  PSA  Patient Instructions (the written plan) was given to the patient.

## 2017-03-29 NOTE — Progress Notes (Signed)
we discussed code status.  pt requests DNR 

## 2017-03-29 NOTE — Patient Instructions (Addendum)
Please stop taking the methimazole. Please come back for a regular physical appointment in 4-5 months (after 08/04/17). Please consider these measures for your health:  minimize alcohol.  Do not use tobacco products.  Have a colonoscopy at least every 10 years from age 83.  Keep firearms safely stored.  Always use seat belts.  have working smoke alarms in your home.  See an eye doctor and dentist regularly.  Never drive under the influence of alcohol or drugs (including prescription drugs).  Those with fair skin should take precautions against the sun, and should carefully examine their skin once per month, for any new or changed moles. It is critically important to prevent falling down (keep floor areas well-lit, dry, and free of loose objects.  If you have a cane, walker, or wheelchair, you should use it, even for short trips around the house.  Wear flat-soled shoes.  Also, try not to rush).

## 2017-03-30 NOTE — Progress Notes (Signed)
Eye exam:  Right eye 20/50 Left eye: 20/30 With prescription glasses.

## 2017-04-19 ENCOUNTER — Other Ambulatory Visit: Payer: Self-pay | Admitting: Endocrinology

## 2017-04-19 DIAGNOSIS — R490 Dysphonia: Secondary | ICD-10-CM

## 2017-04-25 ENCOUNTER — Emergency Department (HOSPITAL_COMMUNITY): Payer: Medicare Other

## 2017-04-25 ENCOUNTER — Inpatient Hospital Stay (HOSPITAL_COMMUNITY)
Admission: EM | Admit: 2017-04-25 | Discharge: 2017-04-27 | DRG: 293 | Disposition: A | Discharge status: Home / Self Care | Payer: Medicare Other | Attending: Internal Medicine | Admitting: Internal Medicine

## 2017-04-25 ENCOUNTER — Encounter (HOSPITAL_COMMUNITY): Payer: Self-pay | Admitting: Nurse Practitioner

## 2017-04-25 ENCOUNTER — Telehealth: Payer: Self-pay

## 2017-04-25 DIAGNOSIS — I5033 Acute on chronic diastolic (congestive) heart failure: Secondary | ICD-10-CM | POA: Diagnosis present

## 2017-04-25 DIAGNOSIS — Z7901 Long term (current) use of anticoagulants: Secondary | ICD-10-CM | POA: Diagnosis not present

## 2017-04-25 DIAGNOSIS — Z952 Presence of prosthetic heart valve: Secondary | ICD-10-CM

## 2017-04-25 DIAGNOSIS — Z955 Presence of coronary angioplasty implant and graft: Secondary | ICD-10-CM

## 2017-04-25 DIAGNOSIS — Z79899 Other long term (current) drug therapy: Secondary | ICD-10-CM

## 2017-04-25 DIAGNOSIS — K219 Gastro-esophageal reflux disease without esophagitis: Secondary | ICD-10-CM | POA: Diagnosis present

## 2017-04-25 DIAGNOSIS — K449 Diaphragmatic hernia without obstruction or gangrene: Secondary | ICD-10-CM | POA: Diagnosis present

## 2017-04-25 DIAGNOSIS — R490 Dysphonia: Secondary | ICD-10-CM | POA: Diagnosis present

## 2017-04-25 DIAGNOSIS — I2510 Atherosclerotic heart disease of native coronary artery without angina pectoris: Secondary | ICD-10-CM | POA: Diagnosis not present

## 2017-04-25 DIAGNOSIS — I482 Chronic atrial fibrillation: Secondary | ICD-10-CM

## 2017-04-25 DIAGNOSIS — Z9861 Coronary angioplasty status: Secondary | ICD-10-CM | POA: Diagnosis not present

## 2017-04-25 DIAGNOSIS — Z8679 Personal history of other diseases of the circulatory system: Secondary | ICD-10-CM | POA: Diagnosis not present

## 2017-04-25 DIAGNOSIS — I10 Essential (primary) hypertension: Secondary | ICD-10-CM | POA: Diagnosis present

## 2017-04-25 DIAGNOSIS — Z953 Presence of xenogenic heart valve: Secondary | ICD-10-CM

## 2017-04-25 DIAGNOSIS — I509 Heart failure, unspecified: Secondary | ICD-10-CM

## 2017-04-25 DIAGNOSIS — Z9889 Other specified postprocedural states: Secondary | ICD-10-CM

## 2017-04-25 DIAGNOSIS — D509 Iron deficiency anemia, unspecified: Secondary | ICD-10-CM | POA: Diagnosis present

## 2017-04-25 DIAGNOSIS — I110 Hypertensive heart disease with heart failure: Secondary | ICD-10-CM | POA: Diagnosis not present

## 2017-04-25 DIAGNOSIS — E0590 Thyrotoxicosis, unspecified without thyrotoxic crisis or storm: Secondary | ICD-10-CM

## 2017-04-25 DIAGNOSIS — Z8546 Personal history of malignant neoplasm of prostate: Secondary | ICD-10-CM

## 2017-04-25 DIAGNOSIS — Z951 Presence of aortocoronary bypass graft: Secondary | ICD-10-CM

## 2017-04-25 DIAGNOSIS — I5031 Acute diastolic (congestive) heart failure: Secondary | ICD-10-CM | POA: Diagnosis not present

## 2017-04-25 LAB — CBC
HEMATOCRIT: 38.7 % — AB (ref 39.0–52.0)
Hemoglobin: 12.8 g/dL — ABNORMAL LOW (ref 13.0–17.0)
MCH: 30.4 pg (ref 26.0–34.0)
MCHC: 33.1 g/dL (ref 30.0–36.0)
MCV: 91.9 fL (ref 78.0–100.0)
Platelets: 159 10*3/uL (ref 150–400)
RBC: 4.21 MIL/uL — ABNORMAL LOW (ref 4.22–5.81)
RDW: 14.5 % (ref 11.5–15.5)
WBC: 6.7 10*3/uL (ref 4.0–10.5)

## 2017-04-25 LAB — I-STAT TROPONIN, ED: Troponin i, poc: 0 ng/mL (ref 0.00–0.08)

## 2017-04-25 LAB — BASIC METABOLIC PANEL
Anion gap: 7 (ref 5–15)
BUN: 24 mg/dL — AB (ref 6–20)
CHLORIDE: 108 mmol/L (ref 101–111)
CO2: 28 mmol/L (ref 22–32)
Calcium: 8.8 mg/dL — ABNORMAL LOW (ref 8.9–10.3)
Creatinine, Ser: 0.9 mg/dL (ref 0.61–1.24)
GFR calc Af Amer: >60 mL/min (ref >60)
GFR calc non Af Amer: >60 mL/min (ref >60)
GLUCOSE: 113 mg/dL — AB (ref 65–99)
POTASSIUM: 4.2 mmol/L (ref 3.5–5.1)
Sodium: 143 mmol/L (ref 135–145)

## 2017-04-25 LAB — MRSA PCR SCREENING: MRSA by PCR: NEGATIVE

## 2017-04-25 LAB — TROPONIN I: Troponin I: <0.03 ng/mL (ref <0.03)

## 2017-04-25 LAB — BRAIN NATRIURETIC PEPTIDE: B Natriuretic Peptide: 367 pg/mL — ABNORMAL HIGH (ref 0.0–100.0)

## 2017-04-25 MED ORDER — TAMSULOSIN HCL 0.4 MG PO CAPS
0.4000 mg | ORAL_CAPSULE | Freq: Every day | ORAL | Status: DC
  Administered 2017-04-25 – 2017-04-26 (×2): 0.4 mg via ORAL
  Filled 2017-04-25 (×2): qty 1

## 2017-04-25 MED ORDER — FUROSEMIDE 10 MG/ML IJ SOLN
90.0000 mg | INTRAMUSCULAR | Status: AC
  Administered 2017-04-25: 90 mg via INTRAVENOUS
  Filled 2017-04-25: qty 12

## 2017-04-25 MED ORDER — PANTOPRAZOLE SODIUM 40 MG PO TBEC
40.0000 mg | DELAYED_RELEASE_TABLET | Freq: Every day | ORAL | Status: DC
  Administered 2017-04-26 – 2017-04-27 (×2): 40 mg via ORAL
  Filled 2017-04-25 (×2): qty 1

## 2017-04-25 MED ORDER — ACETAMINOPHEN 325 MG PO TABS: 650.0000 mg | ORAL_TABLET | ORAL | Status: DC | PRN

## 2017-04-25 MED ORDER — SODIUM CHLORIDE 0.9% FLUSH: 3.0000 mL | INTRAVENOUS | Status: DC | PRN

## 2017-04-25 MED ORDER — ENOXAPARIN SODIUM 40 MG/0.4ML ~~LOC~~ SOLN
40.0000 mg | SUBCUTANEOUS | Status: DC
  Administered 2017-04-25 – 2017-04-26 (×2): 40 mg via SUBCUTANEOUS
  Filled 2017-04-25: qty 0.4

## 2017-04-25 MED ORDER — FUROSEMIDE 10 MG/ML IJ SOLN
40.0000 mg | Freq: Two times a day (BID) | INTRAMUSCULAR | Status: DC
  Administered 2017-04-25 – 2017-04-27 (×4): 40 mg via INTRAVENOUS
  Filled 2017-04-25 (×4): qty 4

## 2017-04-25 MED ORDER — ADULT MULTIVITAMIN W/MINERALS CH
1.0000 | ORAL_TABLET | Freq: Every day | ORAL | Status: DC
  Administered 2017-04-26 – 2017-04-27 (×2): 1 via ORAL
  Filled 2017-04-25 (×2): qty 1

## 2017-04-25 MED ORDER — SIMVASTATIN 40 MG PO TABS
40.0000 mg | ORAL_TABLET | Freq: Every day | ORAL | Status: DC
  Administered 2017-04-25 – 2017-04-26 (×2): 40 mg via ORAL
  Filled 2017-04-25 (×2): qty 1

## 2017-04-25 MED ORDER — CLOPIDOGREL BISULFATE 75 MG PO TABS
75.0000 mg | ORAL_TABLET | Freq: Every day | ORAL | Status: DC
  Administered 2017-04-26 – 2017-04-27 (×2): 75 mg via ORAL
  Filled 2017-04-25 (×2): qty 1

## 2017-04-25 MED ORDER — SODIUM CHLORIDE 0.9% FLUSH
3.0000 mL | Freq: Two times a day (BID) | INTRAVENOUS | Status: DC
  Administered 2017-04-25 – 2017-04-27 (×4): 3 mL via INTRAVENOUS

## 2017-04-25 MED ORDER — SODIUM CHLORIDE 0.9 % IV SOLN: 250.0000 mL | INTRAVENOUS | Status: DC | PRN

## 2017-04-25 MED ORDER — ONDANSETRON HCL 4 MG/2ML IJ SOLN: 4.0000 mg | Freq: Four times a day (QID) | INTRAMUSCULAR | Status: DC | PRN

## 2017-04-25 MED ORDER — POTASSIUM CHLORIDE CRYS ER 20 MEQ PO TBCR
20.0000 meq | EXTENDED_RELEASE_TABLET | Freq: Every day | ORAL | Status: DC
  Administered 2017-04-26 – 2017-04-27 (×2): 20 meq via ORAL
  Filled 2017-04-25 (×2): qty 1

## 2017-04-25 NOTE — ED Notes (Signed)
RN that will be receiving patient just went to lunch. Per Ron, sec she will call upon her arrival back .

## 2017-04-25 NOTE — Telephone Encounter (Addendum)
Patient called in and spoke with Lumine at [facility] to report severe SOB. I spoke with the patient over the phone and during the conversation his breathing was labored and he could not speak above a whisper and was gasping for breath. Patient and his wife were concerned. I advised Dr. Loanne Drilling was out of town and advised for the patient's wife to call EMS to have them come out and evaluate the patient to see if he needed to be transported to the hospital. I advised the patient's wife to call us back and report what the EMS advises the patient to do.

## 2017-04-25 NOTE — H&P (Signed)
[facility] History and Physical  [name] [name]:[id number] DOB: July 11, 1929 DOA: 04/25/2017   PCP: [name], MD  Specialists: Dr. [name] is his cardiologist  Chief Complaint: Shortness of breath  HPI: [name] [name] is a 83 y.o. male with a past medical history of chronic diastolic congestive heart failure, persistent atrial fibrillation, pacemaker placement, status post mitral valve prosthesis, status post TAVR, coronary artery disease status post stent placement in 2016, large diaphragmatic hernia, hypertension, hyperthyroidism, hyperlipidemia, who lives with his wife in ?assisted living facility. He also has been having hoarseness of his voice for which he is supposed to see ENT soon. He comes in with complaints of shortness of breath ongoing for 2-3 days. He has shortness of breath at baseline, which is thought to be due to a combination of his diastolic CHF as well as the diaphragmatic hernia. However, shortness of breath did get worse over the last few days. He denies any cough. No chest pain. No fever. No leg swelling. Since his symptoms are getting worse, he decided to seek attention. Patient is not a very good historian. He denies any nausea, vomiting. His wife is at the bedside.  In the emergency department. Patient underwent chest x-ray which suggested CHF.  Home Medications: Prior to Admission medications   Medication Sig Start Date End Date Taking? Authorizing Provider  clopidogrel (PLAVIX) 75 MG tablet Take 1 tablet (75 mg total) by mouth daily with breakfast. 07/29/16  Yes [name], [name], MD  furosemide (LASIX) 40 MG tablet Take 40-80 mg by mouth daily. Pt takes one tablet on Monday, Wednesday, and Friday.  Pt takes two tablets on Sunday, Tuesday, Thursday, and Saturday.   Yes [provider]  Multiple Vitamin (MULTIVITAMIN WITH MINERALS) TABS Take 1 tablet by mouth daily.    Yes [provider]  pantoprazole (PROTONIX) 40 MG tablet Take 1 tablet  (40 mg total) by mouth daily. 11/09/16  Yes [name], MD  potassium chloride SA (K-DUR,KLOR-CON) 20 MEQ tablet Take 20 mEq by mouth daily.   Yes [provider]  simvastatin (ZOCOR) 40 MG tablet Take 1 tablet (40 mg total) by mouth at bedtime. 02/02/17  Yes [name], MD  Tamsulosin HCl (FLOMAX) 0.4 MG CAPS Take 0.4 mg by mouth at bedtime.    Yes [provider]    Allergies: No Known Allergies  Past Medical History: Past Medical History:  Diagnosis Date  . ANEMIA, IRON DEFICIENCY 10/13/2008  . ANXIETY 09/04/2008  . Aortic valvular disorder 08/16/2012   echo EF 45-50% LV mildly reduce,Biocor mitral valve,mild to mod. tricuspid regrug.,mild to mod. aortic regrug.  . Aortic valvular stenosis 11/19/2013  . ATRIAL FIBRILLATION, CHRONIC 01/14/2008   on warfarin since 2007  . BARRETTS ESOPHAGUS 03/20/2009  . BASAL CELL CARCINOMA OF SKIN SITE UNSPECIFIED 09/28/2010  . Cardiomyopathy, ischemic 11/19/2013   EF 45-50% with inferior wall hypokinesis by echo 2014  . CATARACTS, BILATERAL, HX OF 01/14/2008  . CEREBROVASCULAR ACCIDENT, HX OF 08/18/2008  . CHF (congestive heart failure) ([place])   . COAGULOPATHY, COUMADIN-INDUCED 01/14/2008  . COLONIC POLYPS, ADENOMATOUS 01/14/2008  . CORONARY ARTERY DISEASE 06/01/2007   Cath 04/03/2008; Cath 11/01/1990  . Dysrhythmia    HX OF [place]  . Edema, peripheral 07/30/2009   LEV doppler- no thrombus or thrombophlebitis  . Elevated PSA   . GASTRIC POLYP 05/13/2009  . GERD 06/01/2007  . Hyperglycemia   . HYPERLIPIDEMIA 06/01/2007  . HYPERTENSION 06/01/2007  . HYPERTHYROIDISM 01/06/2011  . Mitral regurgitation 06/13/2008  mitral valve replacement #20 St. Jude Biocor; Maze procedure by Dr. Amador Cunas  at [facility]  . Mitral stenosis    Porcine bioprosthetic tissue valve placed 06/13/2008  . Mitral valve disorder 09/08/2011   echo EF 50-55% LV normal  . Pacemaker   . Peripheral artery disease ([place]) 06/16/2010   LEA doppler-left ABI nml 0.61 calcified  vessels;right ABI  0.68  . PERSONAL HX COLONIC POLYPS 01/01/2010  . PLEURAL EFFUSION, LEFT 10/13/2008  . PROSTATE CANCER, HX OF 01/14/2008  . Prosthetic valve dysfunction    Mitral stenosis involving bioprosthetic tissue valve placed 06/13/2008  . Restless leg syndrome   . S/P CABG x 1 06/13/2008   SVG to LAD with open vein harvest right thigh, LIMA harvested but not utilized by Dr Amador Cunas  . S/P Maze operation for atrial fibrillation 06/13/2008   Partial left atrial lesion set using cryothermy for bilateral pulmonary vein isolation by Dr Amador Cunas  . S/P mitral valve replacement with bioprosthetic valve 06/13/2008   83mm St Jude Biocor porcine bioprosthetic tissue valve by Dr Amador Cunas  . 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 Tachy-brady syndrome ([place]) 07/13/2008   medtronic Adapta gen. model ADDR01 ,serial # [id number] H by Dr  [name] at [facility]  . UNSPECIFIED VENOUS INSUFFICIENCY 09/18/2008    Past Surgical History:  Procedure Laterality Date  . CARDIAC CATHETERIZATION  11/01/1990  . CARDIOVASCULAR STRESS TEST  06/03/2010   Persantine perfusion EF 45% mild to moderate ischemia basal and mid inferolateral region  . CARDIOVASCULAR STRESS TEST  01/01/2008   left ventricle normal,no significant ischemia  . CARDIOVASCULAR STRESS TEST  09/09/2005    possibility of ischemia inferior wall  . COLONOSCOPY  07/14/2009   diverticulosis, internal hemorrhoids  . CORONARY ARTERY BYPASS GRAFT  06/13/2008   graft x1 w/vein graft LAD artery by Dr. Amador Cunas at [facility]  . CORONARY STENT PLACEMENT  03/24/2014   DES to LAD  . CYSTECTOMY     Left leg  . INTRAOPERATIVE TRANSESOPHAGEAL ECHOCARDIOGRAM N/A 04/22/2014   Procedure: INTRAOPERATIVE TRANSTHORACIC ECHOCARDIOGRAM;  Surgeon: [name], MD;  Location: [place];  Service: Open Heart Surgery;  Laterality: N/A;  . LEFT AND RIGHT HEART  CATHETERIZATION WITH CORONARY ANGIOGRAM N/A 01/08/2014   Procedure: LEFT AND RIGHT HEART CATHETERIZATION WITH CORONARY ANGIOGRAM;  Surgeon: Sanda Klein, MD;  Location: [place] CATH LAB;  Service: Cardiovascular;  Laterality: N/A;  . MITRAL VALVE REPLACEMENT  06/13/2008   #20 St. Jude Bicor mitral valve ;Maze procedure at [facility] by Dr. Amador Cunas  . PACEMAKER PLACEMENT  07/13/2008   [place] dual chamber-Medtronic at [facility]   . PERCUTANEOUS CORONARY ROTOBLATOR INTERVENTION (PCI-R) N/A 03/24/2014   Procedure: PERCUTANEOUS CORONARY ROTOBLATOR INTERVENTION (PCI-R);  Surgeon: Blane Ohara, MD;  Location[facility] CATH LAB;  Service: Cardiovascular;  Laterality: N/A;  . PROSTATECTOMY    . SKIN CANCER EXCISION     removed from forehead and right leg, nose/face  . TEE WITHOUT CARDIOVERSION N/A 01/22/2014   Procedure: TRANSESOPHAGEAL ECHOCARDIOGRAM (TEE);  Surgeon: Sanda Klein, MD;  Location: [facility] ENDOSCOPY;  Service: Cardiovascular;  Laterality: N/A;  . TONSILLECTOMY    . TRANSCATHETER AORTIC VALVE REPLACEMENT, TRANSFEMORAL N/A 04/22/2014   Procedure: TRANSCATHETER AORTIC VALVE REPLACEMENT, TRANSFEMORAL;  Surgeon: [name], MD;  Location: [place];  Service: Open Heart Surgery;  Laterality: N/A;  TAVR-TF (RIGHT SIDE)  . UPPER GASTROINTESTINAL ENDOSCOPY  07/14/2009   erosive reflux  esopagitis, Barrett's esophagus    Social History: His wife lives with him. No history of smoking, alcohol use or illicit drug use. He ambulates independently.   Family History:  Family History  Problem Relation Age of Onset  . Breast cancer Mother   . Stroke Mother   . Transient ischemic attack Mother      Review of Systems - History obtained from the patient General ROS: negative Psychological ROS: negative Ophthalmic ROS: negative ENT ROS: Hoarseness Allergy and Immunology ROS: negative Hematological and Lymphatic ROS: negative Endocrine ROS: negative Respiratory ROS: As in history of present illness Cardiovascular ROS:  As in history of present illness Gastrointestinal ROS: no abdominal pain, change in bowel habits, or black or bloody stools Genito-Urinary ROS: no dysuria, trouble voiding, or hematuria Musculoskeletal ROS: negative Neurological ROS: no TIA or stroke symptoms Dermatological ROS: negative  Physical Examination  Vitals:   04/25/17 1121 04/25/17 1138 04/25/17 1144 04/25/17 1300  BP:  (!) 145/100  137/79  Pulse:  (!) 115  (!) 49  Resp:  14  (!) 27  Temp:  97.6 F (36.4 C)    TempSrc:  Oral    SpO2: 98% 92%  97%  Weight:   72.6 kg (160 lb)   Height:   5\' 10"  (1.778 m)     BP 137/79   Pulse (!) 49   Temp 97.6 F (36.4 C) (Oral)   Resp (!) 27   Ht 5\' 10"  (1.778 m)   Wt 72.6 kg (160 lb)   SpO2 97%   BMI 22.96 kg/m   General appearance: alert, cooperative, appears stated age and no distress Head: Normocephalic, without obvious abnormality, atraumatic Eyes: conjunctivae/corneas clear. PERRL, EOM's intact. Fundi benign. Throat: lips, mucosa, and tongue normal; teeth and gums normal Neck: no adenopathy, no carotid bruit, no JVD, supple, symmetrical, trachea midline and thyroid not enlarged, symmetric, no tenderness/mass/nodules Resp: Crackles heard bilaterally. Slight tachypnea noted. Coarse breath sounds. No wheezing. No rhonchi. Cardio: S1, S2 is irregularly irregular. No S3, S4. No rubs, murmurs or GI: soft, non-tender; bowel sounds normal; no masses,  no organomegaly Extremities: extremities normal, atraumatic, no cyanosis or edema Pulses: 2+ and symmetric Neurologic: No focal deficits appreciated.   Labs on Admission: I have personally reviewed following labs and imaging studies  CBC:  Recent Labs Lab 04/25/17 1139  WBC 6.7  HGB 12.8*  HCT 38.7*  MCV 91.9  PLT 119   Basic Metabolic Panel:  Recent Labs Lab 04/25/17 1139  NA 143  K 4.2  CL 108  CO2 28  GLUCOSE 113*  BUN 24*  CREATININE 0.90  CALCIUM 8.8*   GFR: Estimated Creatinine Clearance: 60.5  mL/min (by C-G formula based on SCr of 0.9 mg/dL).  BNP (last 3 results)  Recent Labs  12/29/16 0955  PROBNP 185.0*    Radiological Exams on Admission: Dg Chest 2 View  Result Date: 04/25/2017 CLINICAL DATA:  Shortness of breath . EXAM: CHEST  2 VIEW COMPARISON:  12/29/2016. FINDINGS: Prior CABG. Cardiac pacer with lead tips in right atrium and right ventricle. Prior cardiac valve replacement. Cardiomegaly. Pulmonary vascular prominence and interstitial prominence consistent with CHF. Right mid lung field atelectasis and or infiltrate. Small bilateral pleural effusions. Stable left clavicular fracture. Prominent sliding hiatal hernia. IMPRESSION: 1. Cardiac pacer with lead tips in right atrium and right ventricle. Prior cardiac valve replacement. Cardiomegaly with pulmonary vascular prominence and interstitial prominence. Small bilateral pleural effusions. Findings suggest mild CHF. 2.  Mild right mid lung  field atelectasis and/or infiltrate. 3. Prominent sliding hiatal hernia . Electronically Signed   By: Marcello Moores  Register   On: 04/25/2017 12:27    My interpretation of Electrocardiogram: Appears to be atrial fibrillation at 100 bpm. Normal axis. Diffuse T-wave changes similar to previous EKG. No apparent ischemic changes   Problem List  Principal Problem:   Acute diastolic CHF (congestive heart failure) (HCC) Active Problems:   Essential hypertension   S/P CABG x 1   S/P mitral valve replacement with bioprosthetic valve   S/P Maze operation for atrial fibrillation   CAD S/P percutaneous coronary angioplasty -    S/P TAVR (transcatheter aortic valve replacement)   Hoarse   Assessment: This is a 83 year old Caucasian male with a past medical history as stated earlier, who presents with complaints of shortness of breath. X-ray suggestive of congestive heart failure. This is most likely acute diastolic CHF.  Plan: #1 Acute diastolic CHF with acute hypoxic respiratory failure: His sats  were in the late 80s according to EMS. He normally does not use home oxygen. With oxygen he is saturating normal. He will be given intravenous Lasix. Last echocardiogram was in February 2018 which showed normal systolic function. No need to repeat another echocardiogram. If he does not improve despite the diuretics, then could consider consulting cardiology.  #2 history of coronary artery disease status post drug-eluting stent placed in LAD in 2016. Stable. EKG shows T-wave inversions which have been seen previously. Continue Plavix. No chest pain at this time.  #3 history of persistent atrial fibrillation: He used to be on amiodarone but was taken off of it because of hyperthyroidism. Continue to monitor on telemetry. He is not on anticoagulation due to GI bleeding in the past. Followed by cardiology closely.  #4 history of for permanent pacemaker: Seen recently by his cardiologist. Was functioning well at that time.  #6 history of valvular heart disease status post mitral valve biological prosthesis and TAVR. Stable.  #7 History of hypothyroidism: His home medication list does not mention methimazole. But it looks like he was on this medication recently. This will need to be looked into.  #8 Large diaphragmatic hernia: This is also contributing to his dyspnea. Currently stable.  #9 history of essential hypertension: Monitor blood pressure closely.  DVT Prophylaxis: Lovenox Code Status: Full code. Long discussion with patient and his wife regarding this. Family Communication: Discussed with the patient's wife  Consults called: None   Severity of Illness: The appropriate patient status for this patient is INPATIENT. Inpatient status is judged to be reasonable and necessary in order to provide the required intensity of service to ensure the patient's safety. The patient's presenting symptoms, physical exam findings, and initial radiographic and laboratory data in the context of their chronic  comorbidities is felt to place them at high risk for further clinical deterioration. Furthermore, it is not anticipated that the patient will be medically stable for discharge from the hospital within 2 midnights of admission. The following factors support the patient status of inpatient.   " The patient's presenting symptoms include shortness of breath. " The worrisome physical exam findings include crackles in the lung. " The initial radiographic and laboratory data are worrisome because of CHF. " The chronic co-morbidities include coronary artery disease.   * I certify that at the point of admission it is my clinical judgment that the patient will require inpatient hospital care spanning beyond 2 midnights from the point of admission due to high intensity of service,  high risk for further deterioration and high frequency of surveillance required.*  Further management decisions will depend on results of further testing and patient's response to treatment.   [facility]  [facility] Pager [phone]  If 7PM-7AM, please contact night-coverage [email or web address] Password [facility]  04/25/2017, 2:24 PM

## 2017-04-25 NOTE — ED Triage Notes (Signed)
Pt arrived via EMS with c/o shortness of breath from independent living. Reports this has been ongoing for 3 weeks but significantly worsened over the past 24-48 hours. Per EMS he has accessory muscle use and labored. He was 90% on RA at rest with dyspnea. Lung sounds diminished in bases but clear throughout. Placed on 2L and sats increased to 96%. Pt reports he has saw his cardiologist about the shortness of breath who informed him that he felt it was related to his hernia pushing on diagram. Color is appropriate. VS 32resp, 148/84, 104, ST, 98% 3L/[place], #18 RAC. No medications given. Per ems in and out of a-fibb and ST on monitor. Pt voice hoarse and is seeing a specialist for this. Skin cancer removal last week on left arm.

## 2017-04-25 NOTE — ED Provider Notes (Signed)
[place] DEPT Provider Note   CSN: [id number] Arrival date & time: 04/25/17  1107     History   Chief Complaint Chief Complaint  Patient presents with  . Shortness of Breath    HPI [name] [name] is a 83 y.o. male.  HPI   Pt is a 83 yo male with PMH of HTN, HLD, Afib on Plavix, anemia, prostate cancer, CVA, CAD s/p CABG, PAD, CHF, aortic valve stenosis s/p TAVR who presents to the ED via EMS from [facility] independent [facility] With complaint of shortness of breath. Patient reports having chronic shortness of breath for the past few months which she relates to his history of a hernia which he states is pushing up on his diaphragm. He notes his shortness of breath has gradually worsened over the past few weeks. Denies fever, chills, headache, dizziness, cough, wheezing, chest pain, palpitations, abdominal pain, nausea, vomiting, diarrhea, urinary symptoms, leg swelling, weakness, syncope. EMS report that patient was noted to have accessory muscle use and labored breathing on arrival. Patient was noted to have oxygen saturation of 90% on room air with diminished lung sounds in bases. Patient was laced on 2 L nasal cannula with increase in oxygen saturation to 96%. Patient states he has been taking his home medications as prescribed including Plavix and Lasix. Patient denies any recent hospitalizations. Denies any recent antibiotic use.  Past Medical History:  Diagnosis Date  . ANEMIA, IRON DEFICIENCY 10/13/2008  . ANXIETY 09/04/2008  . Aortic valvular disorder 08/16/2012   echo EF 45-50% LV mildly reduce,Biocor mitral valve,mild to mod. tricuspid regrug.,mild to mod. aortic regrug.  . Aortic valvular stenosis 11/19/2013  . ATRIAL FIBRILLATION, CHRONIC 01/14/2008   on warfarin since 2007  . BARRETTS ESOPHAGUS 03/20/2009  . BASAL CELL CARCINOMA OF SKIN SITE UNSPECIFIED 09/28/2010  . Cardiomyopathy, ischemic 11/19/2013   EF 45-50% with inferior wall hypokinesis by echo 2014  .  CATARACTS, BILATERAL, HX OF 01/14/2008  . CEREBROVASCULAR ACCIDENT, HX OF 08/18/2008  . CHF (congestive heart failure) ([place])   . COAGULOPATHY, COUMADIN-INDUCED 01/14/2008  . COLONIC POLYPS, ADENOMATOUS 01/14/2008  . CORONARY ARTERY DISEASE 06/01/2007   Cath 04/03/2008; Cath 11/01/1990  . Dysrhythmia    HX OF [place]  . Edema, peripheral 07/30/2009   LEV doppler- no thrombus or thrombophlebitis  . Elevated PSA   . GASTRIC POLYP 05/13/2009  . GERD 06/01/2007  . Hyperglycemia   . HYPERLIPIDEMIA 06/01/2007  . HYPERTENSION 06/01/2007  . HYPERTHYROIDISM 01/06/2011  . Mitral regurgitation 06/13/2008   mitral valve replacement #20 St. Jude Biocor; Maze procedure by Dr. Amador Cunas  at [facility]  . Mitral stenosis    Porcine bioprosthetic tissue valve placed 06/13/2008  . Mitral valve disorder 09/08/2011   echo EF 50-55% LV normal  . Pacemaker   . Peripheral artery disease ([place]) 06/16/2010   LEA doppler-left ABI nml 0.61 calcified vessels;right ABI  0.68  . PERSONAL HX COLONIC POLYPS 01/01/2010  . PLEURAL EFFUSION, LEFT 10/13/2008  . PROSTATE CANCER, HX OF 01/14/2008  . Prosthetic valve dysfunction    Mitral stenosis involving bioprosthetic tissue valve placed 06/13/2008  . Restless leg syndrome   . S/P CABG x 1 06/13/2008   SVG to LAD with open vein harvest right thigh, LIMA harvested but not utilized by Dr Amador Cunas  . S/P Maze operation for atrial fibrillation 06/13/2008   Partial left atrial lesion set using cryothermy for bilateral pulmonary vein isolation by Dr Amador Cunas  . S/P mitral valve replacement with bioprosthetic valve 06/13/2008  78mm St Jude Biocor porcine bioprosthetic tissue valve by Dr Amador Cunas  . 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 Tachy-brady syndrome ([place]) 07/13/2008   medtronic Adapta gen. model ADDR01 ,serial # [id number] H by Dr  [name] at [facility]  . UNSPECIFIED  VENOUS INSUFFICIENCY 09/18/2008    Patient Active Problem List   Diagnosis Date Noted  . Hypocalcemia 12/29/2016  . Pulmonary vascular congestion   . CAP (community acquired pneumonia) 12/17/2016  . Elevated troponin 12/17/2016  . CHF (congestive heart failure) ([place]) 12/17/2016  . Pressure ulcer of sacral region, stage 1 09/23/2016  . Acute on chronic diastolic CHF (congestive heart failure) ([place]) 09/21/2016  . Permanent atrial fibrillation ([place]) 09/01/2016  . Chronic diastolic heart failure ([place]) 03/28/2016  . Diaphragmatic hernia 03/28/2016  . 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 Risk for falls 03/28/2016  . Paresthesia of left lower extremity 11/11/2015  . Hoarse 12/23/2014  . GI bleed 05/17/2014  . Rectal bleed 05/17/2014  . Hypotension 05/17/2014  . 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 Dizziness 04/05/2014  . SSS (sick sinus syndrome) ([place]) 03/25/2014  . CAD S/P percutaneous coronary angioplasty -  03/24/2014  . Perivalvular leak of prosthetic heart valve   . Mitral stenosis   . Routine general medical examination at a health care facility 12/31/2013  . Solitary pulmonary nodule 12/08/2013  . Pleural effusion 12/05/2013  . Dyspnea 12/05/2013  . Pacemaker - Medtronic Adapta, dual chamber 11/19/2013  . Restrictive lung disease 03/30/2013  . Encounter for long-term (current) use of other medications 08/17/2012  . BASAL CELL CARCINOMA OF SKIN SITE UNSPECIFIED 09/28/2010  . PERSONAL HX COLONIC POLYPS 01/01/2010  . BARRETTS ESOPHAGUS 03/20/2009  . Hematuria 10/21/2008  . Iron deficiency anemia 10/13/2008  . Anemia 09/18/2008  . UNSPECIFIED VENOUS INSUFFICIENCY 09/18/2008  . ANXIETY 09/04/2008  . Laceration of leg 09/04/2008  . CEREBROVASCULAR ACCIDENT, HX OF 08/18/2008  . S/P CABG x 1 06/13/2008  . S/P mitral valve replacement with bioprosthetic valve 06/13/2008  . S/P Maze operation for atrial fibrillation 06/13/2008  . PROSTATE  CANCER, HX OF 01/14/2008  . CATARACTS, BILATERAL, HX OF 01/14/2008  . Dyslipidemia 06/01/2007  . Essential hypertension 06/01/2007  . GERD 06/01/2007    Past Surgical History:  Procedure Laterality Date  . CARDIAC CATHETERIZATION  11/01/1990  . CARDIOVASCULAR STRESS TEST  06/03/2010   Persantine perfusion EF 45% mild to moderate ischemia basal and mid inferolateral region  . CARDIOVASCULAR STRESS TEST  01/01/2008   left ventricle normal,no significant ischemia  . CARDIOVASCULAR STRESS TEST  09/09/2005    possibility of ischemia inferior wall  . COLONOSCOPY  07/14/2009   diverticulosis, internal hemorrhoids  . CORONARY ARTERY BYPASS GRAFT  06/13/2008   graft x1 w/vein graft LAD artery by Dr. Amador Cunas at [facility]  . CORONARY STENT PLACEMENT  03/24/2014   DES to LAD  . CYSTECTOMY     Left leg  . INTRAOPERATIVE TRANSESOPHAGEAL ECHOCARDIOGRAM N/A 04/22/2014   Procedure: INTRAOPERATIVE TRANSTHORACIC ECHOCARDIOGRAM;  Surgeon: [name], MD;  Location: [facility];  Service: Open Heart Surgery;  Laterality: N/A;  . LEFT AND RIGHT HEART CATHETERIZATION WITH CORONARY ANGIOGRAM N/A 01/08/2014   Procedure: LEFT AND RIGHT HEART CATHETERIZATION WITH CORONARY ANGIOGRAM;  Surgeon: Sanda Klein, MD;  Location: [facility] CATH LAB;  Service: Cardiovascular;  Laterality: N/A;  . MITRAL VALVE REPLACEMENT  06/13/2008   #20  St. Jude Bicor mitral valve ;Maze procedure at [facility] by Dr. Amador Cunas  . PACEMAKER PLACEMENT  07/13/2008   [place] dual chamber-Medtronic at [facility]   . PERCUTANEOUS CORONARY ROTOBLATOR INTERVENTION (PCI-R) N/A 03/24/2014   Procedure: PERCUTANEOUS CORONARY ROTOBLATOR INTERVENTION (PCI-R);  Surgeon: Blane Ohara, MD;  Location: [facility] CATH LAB;  Service: Cardiovascular;  Laterality: N/A;  . PROSTATECTOMY    . SKIN CANCER EXCISION     removed from forehead and right leg, nose/face  . TEE WITHOUT CARDIOVERSION N/A 01/22/2014   Procedure: TRANSESOPHAGEAL ECHOCARDIOGRAM (TEE);  Surgeon: Sanda Klein, MD;   Location: [facility] ENDOSCOPY;  Service: Cardiovascular;  Laterality: N/A;  . TONSILLECTOMY    . TRANSCATHETER AORTIC VALVE REPLACEMENT, TRANSFEMORAL N/A 04/22/2014   Procedure: TRANSCATHETER AORTIC VALVE REPLACEMENT, TRANSFEMORAL;  Surgeon: [name], MD;  Location: [facility];  Service: Open Heart Surgery;  Laterality: N/A;  TAVR-TF (RIGHT SIDE)  . UPPER GASTROINTESTINAL ENDOSCOPY  07/14/2009   erosive reflux esopagitis, Barrett's esophagus       Home Medications    Prior to Admission medications   Medication Sig Start Date End Date Taking? Authorizing Provider  clopidogrel (PLAVIX) 75 MG tablet Take 1 tablet (75 mg total) by mouth daily with breakfast. 07/29/16  Yes [name], [name], MD  furosemide (LASIX) 40 MG tablet Take 40-80 mg by mouth daily. Pt takes one tablet on Monday, Wednesday, and Friday.  Pt takes two tablets on Sunday, Tuesday, Thursday, and Saturday.   Yes [provider]  Multiple Vitamin (MULTIVITAMIN WITH MINERALS) TABS Take 1 tablet by mouth daily.    Yes [provider]  pantoprazole (PROTONIX) 40 MG tablet Take 1 tablet (40 mg total) by mouth daily. 11/09/16  Yes [name], MD  potassium chloride SA (K-DUR,KLOR-CON) 20 MEQ tablet Take 20 mEq by mouth daily.   Yes [provider]  simvastatin (ZOCOR) 40 MG tablet Take 1 tablet (40 mg total) by mouth at bedtime. 02/02/17  Yes [name], MD  Tamsulosin HCl (FLOMAX) 0.4 MG CAPS Take 0.4 mg by mouth at bedtime.    Yes [provider]    Family History Family History  Problem Relation Age of Onset  . Breast cancer Mother   . Stroke Mother   . Transient ischemic attack Mother     Social History Social History  Substance Use Topics  . Smoking status: Former Smoker    Years: 2.00    Types: Pipe    Quit date: 11/07/1965  . Smokeless tobacco: Never Used     Comment: quit over 40 years ago, never smoked cigarettes  . Alcohol use No     Allergies   Patient has no known  allergies.   Review of Systems Review of Systems  Respiratory: Positive for shortness of breath.   All other systems reviewed and are negative.    Physical Exam Updated Vital Signs BP 137/79   Pulse (!) 49   Temp 97.6 F (36.4 C) (Oral)   Resp (!) 27   Ht 5\' 10"  (1.778 m)   Wt 72.6 kg (160 lb)   SpO2 97%   BMI 22.96 kg/m   Physical Exam  Constitutional: He is oriented to person, place, and time. He appears well-developed and well-nourished.  Elderly appearing male.  HENT:  Head: Normocephalic and atraumatic.  Mouth/Throat: Oropharynx is clear and moist. No oropharyngeal exudate.  Eyes: Conjunctivae and EOM are normal. Right eye exhibits no discharge. Left eye exhibits no discharge. No scleral icterus.  Neck: Normal range of motion.  Neck supple.  Cardiovascular: Regular rhythm, normal heart sounds and intact distal pulses.   Mild tachycardia, HR 108  Pulmonary/Chest: He has no wheezes. He has no rales. He exhibits no tenderness.  Tachpneic, RR 30, with mild accessory muscle use present. Pt speaking in broken sentences due to SOB. Slightly diminished lung sounds throughout.  Abdominal: Soft. Bowel sounds are normal. He exhibits no distension and no mass. There is no tenderness. There is no rebound and no guarding. No hernia.  Musculoskeletal: Normal range of motion. He exhibits edema (1+ pitting edema to BLE, pt reports is chronic). He exhibits no tenderness.  Neurological: He is alert and oriented to person, place, and time.  Skin: Skin is warm and dry. He is not diaphoretic.  Nursing note and vitals reviewed.    ED Treatments / Results  Labs (all labs ordered are listed, but only abnormal results are displayed) Labs Reviewed  BASIC METABOLIC PANEL - Abnormal; Notable for the following:       Result Value   Glucose, Bld 113 (*)    BUN 24 (*)    Calcium 8.8 (*)    All other components within normal limits  CBC - Abnormal; Notable for the following:    RBC 4.21 (*)     Hemoglobin 12.8 (*)    HCT 38.7 (*)    All other components within normal limits  BRAIN NATRIURETIC PEPTIDE - Abnormal; Notable for the following:    B Natriuretic Peptide 367.0 (*)    All other components within normal limits  I-STAT TROPOININ, ED    EKG  EKG Interpretation  Date/Time:  Tuesday April 25 2017 11:44:09 EDT Ventricular Rate:  100 PR Interval:    QRS Duration: 105 QT Interval:  381 QTC Calculation: 492 R Axis:   64 Text Interpretation:  Atrial fibrillation Anteroseptal infarct, old Repol abnrm suggests ischemia, diffuse leads Artifact Abnormal ekg Confirmed by Carmin Muskrat [phone]) on 04/25/2017 12:46:32 PM       Radiology Dg Chest 2 View  Result Date: 04/25/2017 CLINICAL DATA:  Shortness of breath . EXAM: CHEST  2 VIEW COMPARISON:  12/29/2016. FINDINGS: Prior CABG. Cardiac pacer with lead tips in right atrium and right ventricle. Prior cardiac valve replacement. Cardiomegaly. Pulmonary vascular prominence and interstitial prominence consistent with CHF. Right mid lung field atelectasis and or infiltrate. Small bilateral pleural effusions. Stable left clavicular fracture. Prominent sliding hiatal hernia. IMPRESSION: 1. Cardiac pacer with lead tips in right atrium and right ventricle. Prior cardiac valve replacement. Cardiomegaly with pulmonary vascular prominence and interstitial prominence. Small bilateral pleural effusions. Findings suggest mild CHF. 2.  Mild right mid lung field atelectasis and/or infiltrate. 3. Prominent sliding hiatal hernia . Electronically Signed   By: Marcello Moores  Register   On: 04/25/2017 12:27    Procedures Procedures (including critical care time)  Medications Ordered in ED Medications  furosemide (LASIX) injection 90 mg (90 mg Intravenous Given 04/25/17 1258)     Initial Impression / Assessment and Plan / ED Course  I have reviewed the triage vital signs and the nursing notes.  Pertinent labs & imaging results that were available  during my care of the patient were reviewed by me and considered in my medical decision making (see chart for details).     Patient presents with shortness of breath that has been present for the past 2 months but significantly worse and this morning. Denies any other associated symptoms. PMH of HTN, HLD, Afib on Plavix, anemia, prostate cancer, CVA, CAD s/p  CABG, PAD, CHF, aortic valve stenosis s/p TAVR. EMS noted patient with increased work of breathing on initial evaluation, O2 saturation 89% on room air, increased to 96% on 2 L nasal cannula. Initial vitals in the ED revealed mild tachycardia, HR 109, afebrile, normotensive, O2 sat 97% on 2L [place]. On exam patient tachypneic with mild increased work of breathing. Slightly diminished lung sounds throughout. 1+ pitting edema to BLE which pt reports is chronic. EKG showed afib, rate 100. Trop negative. BNP 367. CXR showed cardiomegaly with pulmonary vascular prominence, interstitial prominence, small bilateral effusions. Discussed patient with Dr. Vanita Panda who also evaluated patient in the ED. Presentation consistent with CHF exacerbation. Patient given 90 mg IV Lasix in the ED. Discussed results and plan for admission with patient and family. Consulted hospitalist, Dr. [name] agrees to admission.   Final Clinical Impressions(s) / ED Diagnoses   Final diagnoses:  Acute on chronic congestive heart failure, unspecified heart failure type [facility])    New Prescriptions New Prescriptions   No medications on file     Nona Dell, Hershal Coria 04/25/17 1313    Carmin Muskrat, MD 04/26/17 [phone]

## 2017-04-25 NOTE — Telephone Encounter (Signed)
Patient's wife called to update that the EMS is taking the patient to the hospital at this time due to his difficulty breathing. No further action required at this time.

## 2017-04-25 NOTE — ED Notes (Signed)
Bed: WA06 Expected date:  Expected time:  Means of arrival:  Comments: EMS-SOB 

## 2017-04-25 NOTE — Progress Notes (Signed)
Acknowledged CSW consult. Received call from [facility]- pt is resident of independent living community. Requested to be updated if pt needing higher level of care at DC.  Sharren Bridge, MSW, LCSW Clinical Social Work 04/25/2017 [phone]

## 2017-04-26 DIAGNOSIS — I2510 Atherosclerotic heart disease of native coronary artery without angina pectoris: Secondary | ICD-10-CM

## 2017-04-26 DIAGNOSIS — I5031 Acute diastolic (congestive) heart failure: Secondary | ICD-10-CM

## 2017-04-26 DIAGNOSIS — Z951 Presence of aortocoronary bypass graft: Secondary | ICD-10-CM

## 2017-04-26 DIAGNOSIS — I509 Heart failure, unspecified: Secondary | ICD-10-CM

## 2017-04-26 DIAGNOSIS — I10 Essential (primary) hypertension: Secondary | ICD-10-CM

## 2017-04-26 DIAGNOSIS — Z953 Presence of xenogenic heart valve: Secondary | ICD-10-CM

## 2017-04-26 DIAGNOSIS — Z9889 Other specified postprocedural states: Secondary | ICD-10-CM

## 2017-04-26 DIAGNOSIS — R490 Dysphonia: Secondary | ICD-10-CM

## 2017-04-26 DIAGNOSIS — Z9861 Coronary angioplasty status: Secondary | ICD-10-CM

## 2017-04-26 DIAGNOSIS — Z8679 Personal history of other diseases of the circulatory system: Secondary | ICD-10-CM

## 2017-04-26 DIAGNOSIS — Z952 Presence of prosthetic heart valve: Secondary | ICD-10-CM

## 2017-04-26 LAB — CBC WITH DIFFERENTIAL/PLATELET
BASOS ABS: 0 10*3/uL (ref 0.0–0.1)
Basophils Relative: 0 %
Eosinophils Absolute: 0.2 10*3/uL (ref 0.0–0.7)
Eosinophils Relative: 3 %
HEMATOCRIT: 38.2 % — AB (ref 39.0–52.0)
Hemoglobin: 12.4 g/dL — ABNORMAL LOW (ref 13.0–17.0)
LYMPHS PCT: 15 %
Lymphs Abs: 0.8 10*3/uL (ref 0.7–4.0)
MCH: 29.8 pg (ref 26.0–34.0)
MCHC: 32.5 g/dL (ref 30.0–36.0)
MCV: 91.8 fL (ref 78.0–100.0)
Monocytes Absolute: 0.6 10*3/uL (ref 0.1–1.0)
Monocytes Relative: 11 %
NEUTROS ABS: 4 10*3/uL (ref 1.7–7.7)
Neutrophils Relative %: 71 %
Platelets: 157 10*3/uL (ref 150–400)
RBC: 4.16 MIL/uL — AB (ref 4.22–5.81)
RDW: 14.3 % (ref 11.5–15.5)
WBC: 5.6 10*3/uL (ref 4.0–10.5)

## 2017-04-26 LAB — BASIC METABOLIC PANEL
ANION GAP: 7 (ref 5–15)
BUN: 25 mg/dL — ABNORMAL HIGH (ref 6–20)
CO2: 35 mmol/L — ABNORMAL HIGH (ref 22–32)
Calcium: 8.7 mg/dL — ABNORMAL LOW (ref 8.9–10.3)
Chloride: 103 mmol/L (ref 101–111)
Creatinine, Ser: 0.87 mg/dL (ref 0.61–1.24)
GFR calc Af Amer: >60 mL/min (ref >60)
GLUCOSE: 101 mg/dL — AB (ref 65–99)
POTASSIUM: 3.5 mmol/L (ref 3.5–5.1)
Sodium: 145 mmol/L (ref 135–145)

## 2017-04-26 LAB — T4, FREE: Free T4: 1.02 ng/dL (ref 0.61–1.12)

## 2017-04-26 LAB — TSH: TSH: 0.849 u[IU]/mL (ref 0.350–4.500)

## 2017-04-26 LAB — TROPONIN I

## 2017-04-26 LAB — MAGNESIUM: MAGNESIUM: 2 mg/dL (ref 1.7–2.4)

## 2017-04-26 MED ORDER — POTASSIUM CHLORIDE CRYS ER 20 MEQ PO TBCR
40.0000 meq | EXTENDED_RELEASE_TABLET | Freq: Once | ORAL | Status: AC
  Administered 2017-04-26: 40 meq via ORAL
  Filled 2017-04-26: qty 2

## 2017-04-26 NOTE — Progress Notes (Signed)
PROGRESS NOTE    [name] [name]  DJS:[id number] DOB: 03/29/1929 DOA: 04/25/2017 PCP: [name], MD   Brief Narrative: [name] [name] is a 83 y.o. male with a past medical history of chronic diastolic congestive heart failure, persistent atrial fibrillation, pacemaker placement, status post mitral valve prosthesis, status post TAVR, coronary artery disease status post stent placement in 2016, large diaphragmatic hernia, hypertension, hyperthyroidism, hyperlipidemia, who lives with his wife in an ALF. He also has been having hoarseness of his voice for which he is supposed to see ENT soon. He comes in with complaints of shortness of breath ongoing for 2-3 days. He has shortness of breath at baseline, which is thought to be due to a combination of his diastolic CHF as well as the diaphragmatic hernia. However, shortness of breath did get worse over the last few days. He denies any cough. No chest pain. No fever. No leg swelling. Since his symptoms are getting worse, he decided to seek attention. Patient is not a very good historian. In the emergency department. Patient underwent chest x-ray which suggested CHF. He is currently being diuresed and is slowly improving.   Assessment & Plan:   Principal Problem:   Acute diastolic CHF (congestive heart failure) (HCC) Active Problems:   Essential hypertension   S/P CABG x 1   S/P mitral valve replacement with bioprosthetic valve   S/P Maze operation for atrial fibrillation   CAD S/P percutaneous coronary angioplasty -    S/P TAVR (transcatheter aortic valve replacement)   Hoarse  Acute Respiratory Failure with Hypoxia 2/2 to Acute Decompensation of Diastolic CHF -O2 Saturations were in the 80's on Room Air and does not use supplemental O2 at home -CXR showed Prior CABG. Cardiac pacer with lead tips in right atrium and right ventricle. Prior cardiac valve replacement. Cardiomegaly. Pulmonary vascular prominence and interstitial prominence consistent  with CHF. Right mid lung field atelectasis and or infiltrate. Small bilateral pleural effusions. Stable left clavicular fracture. Prominent sliding hiatal hernia. -C/w Supplemental O2 and Wean as Tolerated -C/w Continuous Pulse Oximetry and Maintain O2 Saturations > 92% -Repeat CXR iN AM  Acute Decompensation of Diastolic CHF -BNP on Admission was 367.0 -CXR Frindings as Above -ECHOCARDIOGRAM 12/18/16 showed EF of 55-65% but had some moderat increase in Systolic Pressure in the Pulmonary Arteries; Will not repeat ECHO -C/w IV Lasix 40 mg q12h; Received IV 90 mg in ED -Strict I's/O's, Daily Weights, SLIV and Fluid Restrict -Wearing Condom Catheter for Accurate I's/O's -Patient is -2.725 Liters and has lost 17 lbs since admission -If he does not improve despite the diuretics, then could consider consulting Cardiology.  Coronary artery disease status post drug-eluting stent placed in LAD in 2016.  -Stable. EKG shows T-wave inversions which have been seen previously.  -Troponin I was <0.03 x 3  -C/w Clopidogrel 75 mg po Daily annd with Simvastatin 40 mg po qHS.  -No chest pain at this time.  Persistent/Chronic Atrial Fibrillation:  -S/p MAZE procedure -He used to be on amiodarone but was taken off of it because of hyperthyroidism.  -Continue to monitor on telemetry.  -He is not on anticoagulation due to GI bleeding in the past.  -Followed by Cardiology closely as an outpatient.  History of for Permanent Pacemaker:  -Seen recently by his Cardiologist.  -Was functioning well at that time.  Valvular heart disease status post mitral valve biological prosthesis and TAVR.  -Stable.  History of Hyperthyroidism -His home medication list does not mention Methimazole. But it  looks like he was on this medication recently.  -Checked TSH and aws 0.849 and Free T4 was -Will discuss with Patient if he has been on it  Large Diaphragmatic Hernia  -This is also contributing to his dyspnea.  Currently stable. -Repeat CXR in AM  Essential Hypertension -Monitor blood pressure closely.  Leg Cramping -Mag was 2.0 and K+ was 3.5 -Replete Potassium with 40 mEQ x 1 and getting 20 mEQ po Daily while being diuresed  GERD/Barret's Esophagus -C/w Pantoprazole 40 mg po Daily   Iron Deficiency Anemia -Patient's Hb/Hct went from 12.8/38.7 -> 12.4/38.2 -Continue to Monitor for S/Sx of Bleeding.   Hx of Prostate Cancer -C/w Tamsulosin 0.4 mg po qHS at Bedtime  Hoarseness -Will need to Follow up with ENT as an outpatient.   DVT prophylaxis: Enoxaparin 40 mg sq q24h Code Status: FULL CODE Family Communication: No Family present at bedside Disposition Plan: Will obtain PT Eval and Walk Screen; Likely ALF at D/C  Consultants:   None   Procedures: None  Antimicrobials:  Anti-infectives    None     Subjective: Seen and examined and felt about the same. No nausea or vomiting. Still feels hoarse and states his legs are cramping.   Objective: Vitals:   04/25/17 1300 04/25/17 1601 04/25/17 2045 04/26/17 0529  BP: 137/79 (!) 167/83 (!) 158/71 131/67  Pulse: (!) 49 (!) 56 (!) 57 (!) 57  Resp: (!) 27  (!) 24 20  Temp:  97.5 F (36.4 C) 97.7 F (36.5 C) 97.7 F (36.5 C)  TempSrc:  Oral Axillary Axillary  SpO2: 97% 97% 95% 96%  Weight:  67.7 kg (149 lb 4 oz)  65.2 kg (143 lb 11.8 oz)  Height:  5\' 10"  (1.778 m)      Intake/Output Summary (Last 24 hours) at 04/26/17 0841 Last data filed at 04/26/17 0536  Gross per 24 hour  Intake                0 ml  Output             2725 ml  Net            -2725 ml   Filed Weights   04/25/17 1144 04/25/17 1601 04/26/17 0529  Weight: 72.6 kg (160 lb) 67.7 kg (149 lb 4 oz) 65.2 kg (143 lb 11.8 oz)   Examination: Physical Exam:  Constitutional: Elderly Caucasian Male in NAD and appears calm and comfortable Eyes: Lids and conjunctivae normal, sclerae anicteric  Head: Has Bandage on Forehead from Skin Cancer removal ENMT:  External Ears, Nose appear normal. Grossly normal hearing. Mucous membranes are moist.  Neck: Appears normal, supple, no cervical masses, normal ROM, no appreciable thyromegaly, no appreciable JVD Respiratory: Diminished to auscultation bilaterally with mild crackles. No appreciable wheezing/rales/rhonchi. Normal respiratory effort and patient is not tachypenic. No accessory muscle use. Wearing supplemental O2.  Cardiovascular: Irregularly Irregular, no murmurs / rubs / gallops. S1 and S2 auscultated. No extremity edema, has chronic lower extremity changes from Coumadin.  Abdomen: Soft, non-tender, non-distended. No masses palpated. No appreciable hepatosplenomegaly. Bowel sounds positive.  GU: Deferred. Musculoskeletal: No clubbing / cyanosis of digits/nails. No joint deformity upper and lower extremities.  Skin: No rashes. Has Skin changes from Heparin and has multiple skin cancer leisons that were removed. No induration; Warm and dry.  Neurologic: CN 2-12 grossly intact with no focal deficits. Sensation intact in all 4 Extremities. Romberg sign cerebellar reflexes not assessed.  Psychiatric: Normal judgment and insight.  Alert and oriented x 3. Normal mood and appropriate affect.   Data Reviewed: I have personally reviewed following labs and imaging studies  CBC:  Recent Labs Lab 04/25/17 1139  WBC 6.7  HGB 12.8*  HCT 38.7*  MCV 91.9  PLT 725   Basic Metabolic Panel:  Recent Labs Lab 04/25/17 1139 04/26/17 0426  NA 143 145  K 4.2 3.5  CL 108 103  CO2 28 35*  GLUCOSE 113* 101*  BUN 24* 25*  CREATININE 0.90 0.87  CALCIUM 8.8* 8.7*   GFR: Estimated Creatinine Clearance: 56.2 mL/min (by C-G formula based on SCr of 0.87 mg/dL). Liver Function Tests: No results for input(s): AST, ALT, ALKPHOS, BILITOT, PROT, ALBUMIN in the last 168 hours. No results for input(s): LIPASE, AMYLASE in the last 168 hours. No results for input(s): AMMONIA in the last 168 hours. Coagulation  Profile: No results for input(s): INR, PROTIME in the last 168 hours. Cardiac Enzymes:  Recent Labs Lab 04/25/17 1622 04/25/17 2151 04/26/17 0426  TROPONINI <0.03 <0.03 <0.03   BNP (last 3 results)  Recent Labs  12/29/16 0955  PROBNP 185.0*   HbA1C: No results for input(s): HGBA1C in the last 72 hours. CBG: No results for input(s): GLUCAP in the last 168 hours. Lipid Profile: No results for input(s): CHOL, HDL, LDLCALC, TRIG, CHOLHDL, LDLDIRECT in the last 72 hours. Thyroid Function Tests: No results for input(s): TSH, T4TOTAL, FREET4, T3FREE, THYROIDAB in the last 72 hours. Anemia Panel: No results for input(s): VITAMINB12, FOLATE, FERRITIN, TIBC, IRON, RETICCTPCT in the last 72 hours. Sepsis Labs: No results for input(s): PROCALCITON, LATICACIDVEN in the last 168 hours.  Recent Results (from the past 240 hour(s))  MRSA PCR Screening     Status: None   Collection Time: 04/25/17  4:49 PM  Result Value Ref Range Status   MRSA by PCR NEGATIVE NEGATIVE Final    Comment:        The GeneXpert MRSA Assay (FDA approved for NASAL specimens only), is one component of a comprehensive MRSA colonization surveillance program. It is not intended to diagnose MRSA infection nor to guide or monitor treatment for MRSA infections.     Radiology Studies: Dg Chest 2 View  Result Date: 04/25/2017 CLINICAL DATA:  Shortness of breath . EXAM: CHEST  2 VIEW COMPARISON:  12/29/2016. FINDINGS: Prior CABG. Cardiac pacer with lead tips in right atrium and right ventricle. Prior cardiac valve replacement. Cardiomegaly. Pulmonary vascular prominence and interstitial prominence consistent with CHF. Right mid lung field atelectasis and or infiltrate. Small bilateral pleural effusions. Stable left clavicular fracture. Prominent sliding hiatal hernia. IMPRESSION: 1. Cardiac pacer with lead tips in right atrium and right ventricle. Prior cardiac valve replacement. Cardiomegaly with pulmonary vascular  prominence and interstitial prominence. Small bilateral pleural effusions. Findings suggest mild CHF. 2.  Mild right mid lung field atelectasis and/or infiltrate. 3. Prominent sliding hiatal hernia . Electronically Signed   By: Marcello Moores  Register   On: 04/25/2017 12:27   Scheduled Meds: . clopidogrel  75 mg Oral Q breakfast  . enoxaparin (LOVENOX) injection  40 mg Subcutaneous Q24H  . furosemide  40 mg Intravenous Q12H  . multivitamin with minerals  1 tablet Oral Daily  . pantoprazole  40 mg Oral Daily  . potassium chloride SA  20 mEq Oral Daily  . simvastatin  40 mg Oral QHS  . sodium chloride flush  3 mL Intravenous Q12H  . tamsulosin  0.4 mg Oral QHS   Continuous Infusions: . sodium chloride  LOS: 1 day   Kerney Elbe, DO [facility] Pager [phone]  If 7PM-7AM, please contact night-coverage [email or web address] Password [facility] 04/26/2017, 8:41 AM

## 2017-04-26 NOTE — Progress Notes (Signed)
Pt c/o leg cramps earlier in am, oral KCL given as ordered, pt states relief and leg cramps no longer an issue, will cont to monitor. SRP, RN

## 2017-04-27 LAB — CBC WITH DIFFERENTIAL/PLATELET
BASOS ABS: 0 10*3/uL (ref 0.0–0.1)
Basophils Relative: 0 %
Eosinophils Absolute: 0.2 10*3/uL (ref 0.0–0.7)
Eosinophils Relative: 5 %
HEMATOCRIT: 38.3 % — AB (ref 39.0–52.0)
Hemoglobin: 12.3 g/dL — ABNORMAL LOW (ref 13.0–17.0)
LYMPHS PCT: 22 %
Lymphs Abs: 1.1 10*3/uL (ref 0.7–4.0)
MCH: 29.1 pg (ref 26.0–34.0)
MCHC: 32.1 g/dL (ref 30.0–36.0)
MCV: 90.8 fL (ref 78.0–100.0)
Monocytes Absolute: 0.5 10*3/uL (ref 0.1–1.0)
Monocytes Relative: 10 %
NEUTROS ABS: 3.2 10*3/uL (ref 1.7–7.7)
Neutrophils Relative %: 63 %
Platelets: 158 10*3/uL (ref 150–400)
RBC: 4.22 MIL/uL (ref 4.22–5.81)
RDW: 14.2 % (ref 11.5–15.5)
WBC: 5.1 10*3/uL (ref 4.0–10.5)

## 2017-04-27 LAB — COMPREHENSIVE METABOLIC PANEL
ALT: 14 U/L — ABNORMAL LOW (ref 17–63)
AST: 17 U/L (ref 15–41)
Albumin: 3.5 g/dL (ref 3.5–5.0)
Alkaline Phosphatase: 74 U/L (ref 38–126)
Anion gap: 5 (ref 5–15)
BUN: 31 mg/dL — AB (ref 6–20)
CO2: 37 mmol/L — ABNORMAL HIGH (ref 22–32)
CREATININE: 0.94 mg/dL (ref 0.61–1.24)
Calcium: 8.8 mg/dL — ABNORMAL LOW (ref 8.9–10.3)
Chloride: 103 mmol/L (ref 101–111)
GFR calc Af Amer: >60 mL/min (ref >60)
Glucose, Bld: 102 mg/dL — ABNORMAL HIGH (ref 65–99)
Potassium: 4.1 mmol/L (ref 3.5–5.1)
Sodium: 145 mmol/L (ref 135–145)
Total Bilirubin: 0.5 mg/dL (ref 0.3–1.2)
Total Protein: 6.5 g/dL (ref 6.5–8.1)

## 2017-04-27 LAB — PHOSPHORUS: Phosphorus: 4.6 mg/dL (ref 2.5–4.6)

## 2017-04-27 LAB — MAGNESIUM: MAGNESIUM: 2.2 mg/dL (ref 1.7–2.4)

## 2017-04-27 NOTE — Progress Notes (Signed)
Pt being picked up this afternoon at 1330 by his spouse. Discharge instructions given/explained with pt verbalizing understanding.  Multiple followup appointments noted. O2 sats 93% RA. Pt left with clothes and glasses.

## 2017-04-27 NOTE — Progress Notes (Signed)
Pt ambulating in hallway with PT -- O2 sats 93% RA.

## 2017-04-27 NOTE — Discharge Summary (Signed)
Physician Discharge Summary  NESANEL AGUILA ACZ:[id number] DOB: Apr 26, 1929 DOA: 04/25/2017  PCP: [name], MD  Admit date: 04/25/2017 Discharge date: 04/27/2017  Admitted From: [facility] Disposition:  [place] with Home Health PT/OT  Recommendations for Outpatient Follow-up:  1. Follow up with PCP in 1-2 weeks 2. Follow up with Cardiology in 1-2 weeks  3. Follow up with ENT in 1-2 weeks 4. Please obtain CMP/CBC, Mag, Phos in one week 5. Please follow up on the following pending results:  Home Health: YES Equipment/Devices: None Recommended by PT   Discharge Condition: Stable CODE STATUS: FULL CODE  Diet recommendation: Heart Healthy  Brief/Interim Summary: Ilan Kahrs Baueris a 83 y.o.malewith a past medical history of chronic diastolic congestive heart failure, persistent atrial fibrillation, pacemaker placement, status post mitral valve prosthesis, status post TAVR, coronary artery disease status post stent placement in 2016, large diaphragmatic hernia, hypertension, hyperthyroidism, hyperlipidemia, who lives with his wife in an ALF. He also has been having hoarseness of his voice for which he is supposed to see ENT soon. He comes in with complaints of shortness of breath ongoing for 2-3 days. He has shortness of breath at baseline, which is thought to be due to a combination of his diastolic CHF as well as the diaphragmatic hernia. However, shortness of breath did get worse over the last few days. He denies any cough. No chest pain. No fever. No leg swelling. Since his symptoms are getting worse, he decided to seek attention. Patient is not a very good historian. In the emergency department. Patient underwent chest x-ray which suggested CHF. He is currently being diuresed and is slowly improving.   Discharge Diagnoses:  Principal Problem:   Acute diastolic CHF (congestive heart failure) (HCC) Active Problems:   Essential hypertension   S/P CABG  x 1   S/P mitral valve replacement with bioprosthetic valve   S/P Maze operation for atrial fibrillation   CAD S/P percutaneous coronary angioplasty -    S/P TAVR (transcatheter aortic valve replacement)   Hoarse  Acute Respiratory Failure with Hypoxia 2/2 to Acute Decompensation of Diastolic CHF, improved -O2 Saturations were in the 80's on Room Air  On Admission and does not use supplemental O2 at home -CXR showed Prior CABG. Cardiac pacer with lead tips in right atrium and right ventricle. Prior cardiac valve replacement. Cardiomegaly. Pulmonary vascular prominence and interstitial prominence consistent with CHF. Right mid lung field atelectasis and or infiltrate. Small bilateral pleural effusions. Stable left clavicular fracture. Prominent sliding hiatal hernia. -C/w Supplemental O2 and Wean as Tolerated -C/w Continuous Pulse Oximetry and Maintain O2 Saturations > 92% -Walk Screen done and Saturations remained 93% and abov -Patient stable to D/C -Will need to follow up with Cardiology and PCP at D/C  Acute Decompensation of Diastolic CHF, improved  -BNP on Admission was 367.0 -CXR [place] as Above -ECHOCARDIOGRAM 12/18/16 showed EF of 55-65% but had some moderat increase in Systolic Pressure in the Pulmonary Arteries; Will not repeat ECHO -C/w IV Lasix 40 mg q12h; Received IV 90 mg in ED -Strict I's/O's, Daily Weights, SLIV and Fluid Restrict -Wearing Condom Catheter for Accurate I's/O's -Patient is -4.525 Liters and has lost 17 lbs since admission -Follow up with Cardiology as an outpatient .  Coronary artery disease status post drug-eluting stent placed in LAD in 2016.  -Stable. EKG shows T-wave inversions which have been seen previously.  -Troponin I was <0.03 x 3  -C/w Clopidogrel 75 mg po Daily annd with Simvastatin  40 mg po qHS.  -No chest pain at this time. -Follow up with Cardiology as an outpatient   Persistent/Chronic Atrial Fibrillation:  -S/p MAZE procedure -He  used to be on amiodarone but was taken off of it because of hyperthyroidism.  -Continue to monitor on telemetry.  -He is not on anticoagulation due to GI bleeding in the past.  -Followed by Cardiology closely as an outpatient and will need to follow up after D/C  History of for Permanent Pacemaker:  -Seen recently by his Cardiologist.  -Was functioning well at that time. -Follow up with Cardiology as an outpatient   Valvular heart disease status post mitral valve biological prosthesis and TAVR.  -Stable.  History of Hyperthyroidism -His home medication list does not mention Methimazole. But it looks like he was on this medication recently.  -Checked TSH and aws 0.849 and Free T4 was 1.02 -Follow up as an outpatient.   Large Diaphragmatic Hernia  -This is also contributing to his dyspnea. Currently stable. -Repeat CXR in the outpatient Setting   Essential Hypertension -Monitor blood pressure closely. -C/w Home Lasix regimen as well as Tamsulosin  Leg Cramping, improved -Mag was 2.2 and K+ was 4.1 -Repleted Potassium with 40 mEQ x 1 and getting 20 mEQ po Daily while being diuresed -Follow up as an outpatient   GERD/Barret's Esophagus -C/w Pantoprazole 40 mg po Daily   Iron Deficiency Anemia -Patient's Hb/Hct went from 12.8/38.7 -> 12.4/38.2 -> 12.3/38.3 -Continue to Monitor for S/Sx of Bleeding.  -Repeat CBC as an outpatient  Hx of Prostate Cancer -C/w Tamsulosin 0.4 mg po qHS at Bedtime  Hoarseness -Will need to Follow up with ENT as an outpatient and has an appointment with Dr. Redmond Baseman next Monday  Discharge Instructions  Discharge Instructions    (HEART FAILURE PATIENTS) Call MD:  Anytime you have any of the following symptoms: 1) 3 pound weight gain in 24 hours or 5 pounds in 1 week 2) shortness of breath, with or without a dry hacking cough 3) swelling in the hands, feet or stomach 4) if you have to sleep on extra pillows at night in order to breathe.     Complete by:  As directed    Call MD for:  difficulty breathing, headache or visual disturbances    Complete by:  As directed    Call MD for:  extreme fatigue    Complete by:  As directed    Call MD for:  persistant dizziness or light-headedness    Complete by:  As directed    Call MD for:  persistant nausea and vomiting    Complete by:  As directed    Call MD for:  redness, tenderness, or signs of infection (pain, swelling, redness, odor or green/yellow discharge around incision site)    Complete by:  As directed    Call MD for:  severe uncontrolled pain    Complete by:  As directed    Call MD for:  temperature >100.4    Complete by:  As directed    Diet - low sodium heart healthy    Complete by:  As directed    Discharge instructions    Complete by:  As directed    Follow up with PCP, Cardiology, and ENT as an outpatient. Take all medications as prescribed. If symptoms change or worsen please return to the ED for evaluation.   Increase activity slowly    Complete by:  As directed      Allergies as of  04/27/2017   No Known Allergies     Medication List    TAKE these medications   clopidogrel 75 MG tablet Commonly known as:  PLAVIX Take 1 tablet (75 mg total) by mouth daily with breakfast.   furosemide 40 MG tablet Commonly known as:  LASIX Take 40-80 mg by mouth daily. Pt takes one tablet on Monday, Wednesday, and Friday.  Pt takes two tablets on Sunday, Tuesday, Thursday, and Saturday.   multivitamin with minerals Tabs tablet Take 1 tablet by mouth daily.   pantoprazole 40 MG tablet Commonly known as:  PROTONIX Take 1 tablet (40 mg total) by mouth daily.   potassium chloride SA 20 MEQ tablet Commonly known as:  K-DUR,KLOR-CON Take 20 mEq by mouth daily.   simvastatin 40 MG tablet Commonly known as:  ZOCOR Take 1 tablet (40 mg total) by mouth at bedtime.   tamsulosin 0.4 MG Caps capsule Commonly known as:  FLOMAX Take 0.4 mg by mouth at bedtime.       Follow-up Information    [name], MD. Call in 1 week(s).   Specialty:  Endocrinology Why:  Call to schedule an appointment with your PCP Contact information: [address] [place] [address] [phone]        Melida Quitter, MD. Go to.   Specialty:  Otolaryngology Why:  Go to scheduled appointment for Hoarseness Contact information: [address] [place] [address] [place] [address] [phone]        Sanda Klein, MD. Call.   Specialty:  Cardiology Why:  Call to schedule an appoinment with Cardiologist in 1-2 weeks Contact information: [address] [place] [address] [phone]          No Known Allergies  Consultations:  None  Procedures/Studies: Dg Chest 2 View  Result Date: 04/25/2017 CLINICAL DATA:  Shortness of breath . EXAM: CHEST  2 VIEW COMPARISON:  12/29/2016. FINDINGS: Prior CABG. Cardiac pacer with lead tips in right atrium and right ventricle. Prior cardiac valve replacement. Cardiomegaly. Pulmonary vascular prominence and interstitial prominence consistent with CHF. Right mid lung field atelectasis and or infiltrate. Small bilateral pleural effusions. Stable left clavicular fracture. Prominent sliding hiatal hernia. IMPRESSION: 1. Cardiac pacer with lead tips in right atrium and right ventricle. Prior cardiac valve replacement. Cardiomegaly with pulmonary vascular prominence and interstitial prominence. Small bilateral pleural effusions. Findings suggest mild CHF. 2.  Mild right mid lung field atelectasis and/or infiltrate. 3. Prominent sliding hiatal hernia . Electronically Signed   By: Marcello Moores  Register   On: 04/25/2017 12:27     Subjective: Seen and examined at bedside and felt great. No complaints and states SOB has resolved. Ready to go home and states he will follow up with PCP, Cardiology, and ENT in the outpatient setting.  Discharge Exam: Vitals:   04/26/17 2111 04/27/17 0406  BP: 135/71  139/80  Pulse: 62 (!) 55  Resp: 18 19  Temp: 98 F (36.7 C) 98 F (36.7 C)   Vitals:   04/26/17 0529 04/26/17 1334 04/26/17 2111 04/27/17 0406  BP: 131/67 134/71 135/71 139/80  Pulse: (!) 57 (!) 57 62 (!) 55  Resp: 20 18 18 19   Temp: 97.7 F (36.5 C) 98.1 F (36.7 C) 98 F (36.7 C) 98 F (36.7 C)  TempSrc: Axillary Oral Oral Oral  SpO2: 96% 97% 95% 99%  Weight: 65.2 kg (143 lb 11.8 oz)     Height:       General: Pt is  alert, awake, not in acute distress Cardiovascular: Irregularly Irregular, S1/S2 +, no rubs, no gallops Respiratory: Diminished bilaterally, no wheezing, no rhonchi Abdominal: Soft, NT, ND, bowel sounds + Extremities: no edema, no cyanosis; Chronic LE changes from Coumadin. Has multiple places where skin cancer was removed.   The results of significant diagnostics from this hospitalization (including imaging, microbiology, ancillary and laboratory) are listed below for reference.    Microbiology: Recent Results (from the past 240 hour(s))  MRSA PCR Screening     Status: None   Collection Time: 04/25/17  4:49 PM  Result Value Ref Range Status   MRSA by PCR NEGATIVE NEGATIVE Final    Comment:        The GeneXpert MRSA Assay (FDA approved for NASAL specimens only), is one component of a comprehensive MRSA colonization surveillance program. It is not intended to diagnose MRSA infection nor to guide or monitor treatment for MRSA infections.     Labs: BNP (last 3 results)  Recent Labs  09/21/16 0919 12/17/16 1357 04/25/17 1139  BNP 481.6* 392.0* 762.8*   Basic Metabolic Panel:  Recent Labs Lab 04/25/17 1139 04/26/17 0426 04/27/17 0548  NA 143 145 145  K 4.2 3.5 4.1  CL 108 103 103  CO2 28 35* 37*  GLUCOSE 113* 101* 102*  BUN 24* 25* 31*  CREATININE 0.90 0.87 0.94  CALCIUM 8.8* 8.7* 8.8*  MG  --  2.0 2.2  PHOS  --   --  4.6   Liver Function Tests:  Recent Labs Lab 04/27/17 0548  AST 17  ALT 14*  ALKPHOS 74  BILITOT 0.5  PROT  6.5  ALBUMIN 3.5   No results for input(s): LIPASE, AMYLASE in the last 168 hours. No results for input(s): AMMONIA in the last 168 hours. CBC:  Recent Labs Lab 04/25/17 1139 04/26/17 0426 04/27/17 0548  WBC 6.7 5.6 5.1  NEUTROABS  --  4.0 3.2  HGB 12.8* 12.4* 12.3*  HCT 38.7* 38.2* 38.3*  MCV 91.9 91.8 90.8  PLT 159 157 158   Cardiac Enzymes:  Recent Labs Lab 04/25/17 1622 04/25/17 2151 04/26/17 0426  TROPONINI <0.03 <0.03 <0.03   BNP: Invalid input(s): POCBNP CBG: No results for input(s): GLUCAP in the last 168 hours. D-Dimer No results for input(s): DDIMER in the last 72 hours. Hgb A1c No results for input(s): HGBA1C in the last 72 hours. Lipid Profile No results for input(s): CHOL, HDL, LDLCALC, TRIG, CHOLHDL, LDLDIRECT in the last 72 hours. Thyroid function studies  Recent Labs  04/26/17 0426  TSH 0.849   Anemia work up No results for input(s): VITAMINB12, FOLATE, FERRITIN, TIBC, IRON, RETICCTPCT in the last 72 hours. Urinalysis    Component Value Date/Time   COLORURINE ORANGE (A) 04/03/2015 2021   APPEARANCEUR CLOUDY (A) 04/03/2015 2021   LABSPEC 1.028 04/03/2015 2021   PHURINE 5.0 04/03/2015 2021   GLUCOSEU NEGATIVE 04/03/2015 2021   GLUCOSEU NEGATIVE 04/03/2015 1106   HGBUR NEGATIVE 04/03/2015 2021   HGBUR large 10/21/2008 0911   BILIRUBINUR SMALL (A) 04/03/2015 2021   KETONESUR 15 (A) 04/03/2015 2021   PROTEINUR NEGATIVE 04/03/2015 2021   UROBILINOGEN 1.0 04/03/2015 2021   NITRITE NEGATIVE 04/03/2015 2021   LEUKOCYTESUR SMALL (A) 04/03/2015 2021   Sepsis Labs Invalid input(s): PROCALCITONIN,  WBC,  LACTICIDVEN Microbiology Recent Results (from the past 240 hour(s))  MRSA PCR Screening     Status: None   Collection Time: 04/25/17  4:49 PM  Result Value Ref Range Status   MRSA  by PCR NEGATIVE NEGATIVE Final    Comment:        The GeneXpert MRSA Assay (FDA approved for NASAL specimens only), is one component of a comprehensive MRSA  colonization surveillance program. It is not intended to diagnose MRSA infection nor to guide or monitor treatment for MRSA infections.    Time coordinating discharge: 35 minutes  SIGNED:  Kerney Elbe, DO [facility] 04/27/2017, 9:27 PM Pager [phone]  If 7PM-7AM, please contact night-coverage [email or web address] Password [email or web address]

## 2017-04-27 NOTE — Evaluation (Signed)
Occupational Therapy Evaluation Patient Details Name: [name] [name] MRN: [id number] DOB: 1929-04-23 Today's Date: 04/27/2017    History of Present Illness 83 y.o. male with a past medical history of chronic diastolic congestive heart failure, persistent atrial fibrillation, pacemaker placement, status post mitral valve prosthesis, status post TAVR, coronary artery disease status post stent placement in 2016, large diaphragmatic hernia, hypertension, hyperthyroidism, hyperlipidemia, who lives with his wife in an ILF and admitted with Acute Respiratory Failure with Hypoxia 2/2 to Acute Decompensation of Diastolic CHF   Clinical Impression   Pt admitted with the above diagnoses and presents with below problem list. Pt will benefit from continued OT to address the below listed deficits and maximize independence with basic ADLs. PTA pt was independent with ADLs. Pt currently min guard to min A with functional mobility. 2 LOB needing min A and external support (door frame, railing) to prevent a fall.  Educated on fall prevention strategies. Pt is for d/c home today.     Follow Up Recommendations  Home health OT    Equipment Recommendations  None recommended by OT    Recommendations for Other Services       Precautions / Restrictions Precautions Precautions: Fall Restrictions Weight Bearing Restrictions: No      Mobility Bed Mobility Overal bed mobility: Needs Assistance Bed Mobility: Supine to Sit     Supine to sit: Supervision;HOB elevated     General bed mobility comments: up in chair  Transfers Overall transfer level: Needs assistance Equipment used: None Transfers: Sit to/from Stand Sit to Stand: Min guard         General transfer comment: min/guard for safety    Balance Overall balance assessment: Needs assistance         Standing balance support: No upper extremity supported Standing balance-Leahy Scale: Fair Standing balance comment: fair static standing  balance fair-poor balance with mobility.                            ADL either performed or assessed with clinical judgement   ADL Overall ADL's : Needs assistance/impaired Eating/Feeding: Set up;Sitting   Grooming: Set up;Sitting   Upper Body Bathing: Set up;Sitting   Lower Body Bathing: Min guard;Minimal assistance;Sit to/from stand   Upper Body Dressing : Set up;Sitting   Lower Body Dressing: Min guard;Minimal assistance;Sit to/from stand   Toilet Transfer: Min guard;Minimal assistance;Ambulation;RW;Grab bars   Toileting- Clothing Manipulation and Hygiene: Min guard;Sitting/lateral lean;Sit to/from stand   Tub/ Shower Transfer: Walk-in shower;Min guard;Ambulation;Shower seat;Grab Paediatric nurse Details (indicate cue type and reason): used wall for stability. close min guard for safety. Functional mobility during ADLs: Min guard;Minimal assistance General ADL Comments: Pt completed in-room functional mobility, toilet and shower transfers. Using external support at times. 2 LOB with min A and external support to prevent fall. Educated on fall prevention and using external supports as needed.      Vision         Perception     Praxis      Pertinent Vitals/Pain Pain Assessment: No/denies pain     Hand Dominance     Extremity/Trunk Assessment Upper Extremity Assessment Upper Extremity Assessment: Generalized weakness   Lower Extremity Assessment Lower Extremity Assessment: Defer to PT evaluation       Communication Communication Communication: Expressive difficulties;Other (comment) (low volume, labored vocalization)   Cognition Arousal/Alertness: Awake/alert Behavior During Therapy: WFL for tasks assessed/performed Overall Cognitive Status: Within Functional Limits for tasks  assessed                                     General Comments       Exercises     Shoulder Instructions      Home Living Family/patient  expects to be discharged to:: Other (Comment) (ILF) Living Arrangements: Spouse/significant other Available Help at Discharge: Family;Available 24 hours/day Type of Home: Independent living facility Home Access: Level entry     Home Layout: One level               Home Equipment: Walker - 2 wheels          Prior Functioning/Environment Level of Independence: Independent                 OT Problem List: Impaired balance (sitting and/or standing);Decreased knowledge of use of DME or AE;Decreased knowledge of precautions;Cardiopulmonary status limiting activity      OT Treatment/Interventions:      OT Goals(Current goals can be found in the care plan section) Acute Rehab OT Goals Patient Stated Goal: home today  OT Frequency:     Barriers to D/C:            Co-evaluation              AM-PAC PT "6 Clicks" Daily Activity     Outcome Measure Help from another person eating meals?: None Help from another person taking care of personal grooming?: None Help from another person toileting, which includes using toliet, bedpan, or urinal?: A Little Help from another person bathing (including washing, rinsing, drying)?: A Little Help from another person to put on and taking off regular upper body clothing?: None Help from another person to put on and taking off regular lower body clothing?: A Little 6 Click Score: 21   End of Session Equipment Utilized During Treatment: Gait belt Nurse Communication: Other (comment);Mobility status (O2 sats 92-93 on RA. )  Activity Tolerance: Patient tolerated treatment well Patient left: in chair;with call bell/phone within reach;with nursing/sitter in room  OT Visit Diagnosis: Unsteadiness on feet (R26.81);Muscle weakness (generalized) (M62.81)                Time: 6579-0383 OT Time Calculation (min): 25 min Charges:  OT General Charges $OT Visit: 1 Procedure OT Evaluation $OT Eval Low Complexity: 1 Procedure OT  Treatments $Self Care/Home Management : 8-22 mins G-Codes:       Hortencia Pilar 04/27/2017, 1:17 PM

## 2017-04-27 NOTE — Evaluation (Signed)
Physical Therapy Evaluation Patient Details Name: [name] [name] MRN: [id number] DOB: 11-22-1928 Today's Date: 04/27/2017   History of Present Illness  83 y.o. male with a past medical history of chronic diastolic congestive heart failure, persistent atrial fibrillation, pacemaker placement, status post mitral valve prosthesis, status post TAVR, coronary artery disease status post stent placement in 2016, large diaphragmatic hernia, hypertension, hyperthyroidism, hyperlipidemia, who lives with his wife in an ILF and admitted with Acute Respiratory Failure with Hypoxia 2/2 to Acute Decompensation of Diastolic CHF  Clinical Impression  Pt admitted with above diagnosis. Pt currently with functional limitations due to the deficits listed below (see PT Problem List).  Pt will benefit from skilled PT to increase their independence and safety with mobility to allow discharge to the venue listed below.  Pt eager to mobilize and tolerated good distance.  Pt hopeful to d/c back to ILF today, recommend HHPT.     Follow Up Recommendations Home health PT    Equipment Recommendations  None recommended by PT    Recommendations for Other Services       Precautions / Restrictions Precautions Precautions: Fall      Mobility  Bed Mobility Overal bed mobility: Needs Assistance Bed Mobility: Supine to Sit     Supine to sit: Supervision;HOB elevated        Transfers Overall transfer level: Needs assistance Equipment used: None Transfers: Sit to/from Stand Sit to Stand: Min guard         General transfer comment: min/guard for safety  Ambulation/Gait Ambulation/Gait assistance: Min guard Ambulation Distance (Feet): 360 Feet Assistive device: None Gait Pattern/deviations: Step-through pattern;Decreased stride length     General Gait Details: pt pushed dynamap, no unsteadiness or LOB observed, SPo2 93% room air during ambulation, briefly lowest 88% on room air  Stairs             Wheelchair Mobility    Modified Rankin (Stroke Patients Only)       Balance Overall balance assessment:  (denies any recent falls)                                           Pertinent Vitals/Pain Pain Assessment: No/denies pain    Home Living   Living Arrangements: Spouse/significant other Available Help at Discharge: Family;Available 24 hours/day Type of Home: Independent living facility Home Access: Level entry     Home Layout: One level Home Equipment: Walker - 2 wheels      Prior Function Level of Independence: Independent               Hand Dominance        Extremity/Trunk Assessment        Lower Extremity Assessment Lower Extremity Assessment: Generalized weakness       Communication   Communication: No difficulties  Cognition Arousal/Alertness: Awake/alert Behavior During Therapy: WFL for tasks assessed/performed Overall Cognitive Status: Within Functional Limits for tasks assessed                                        General Comments      Exercises     Assessment/Plan    PT Assessment Patient needs continued PT services  PT Problem List Decreased strength;Decreased mobility;Decreased activity tolerance       PT Treatment Interventions  DME instruction;Gait training;Therapeutic activities;Therapeutic exercise;Functional mobility training;Patient/family education    PT Goals (Current goals can be found in the Care Plan section)  Acute Rehab PT Goals PT Goal Formulation: With patient Time For Goal Achievement: 05/04/17 Potential to Achieve Goals: Good    Frequency Min 3X/week   Barriers to discharge        Co-evaluation               AM-PAC PT "6 Clicks" Daily Activity  Outcome Measure Difficulty turning over in bed (including adjusting bedclothes, sheets and blankets)?: None Difficulty moving from lying on back to sitting on the side of the bed? : None Difficulty sitting down  on and standing up from a chair with arms (e.g., wheelchair, bedside commode, etc,.)?: A Little Help needed moving to and from a bed to chair (including a wheelchair)?: A Little Help needed walking in hospital room?: A Little Help needed climbing 3-5 steps with a railing? : A Little 6 Click Score: 20    End of Session   Activity Tolerance: Patient tolerated treatment well Patient left: in chair;with call bell/phone within reach;with chair alarm set Nurse Communication: Mobility status PT Visit Diagnosis: Other abnormalities of gait and mobility (R26.89)    Time: 3276-1470 PT Time Calculation (min) (ACUTE ONLY): 17 min   Charges:   PT Evaluation $PT Eval Low Complexity: 1 Procedure     PT G CodesCarmelia Bake, PT, DPT 04/27/2017 Pager: [phone]   York Ram E 04/27/2017, 12:33 PM

## 2017-05-03 ENCOUNTER — Other Ambulatory Visit: Payer: Self-pay | Admitting: Otolaryngology

## 2017-05-03 DIAGNOSIS — J3801 Paralysis of vocal cords and larynx, unilateral: Secondary | ICD-10-CM

## 2017-05-04 ENCOUNTER — Ambulatory Visit
Admission: RE | Admit: 2017-05-04 | Discharge: 2017-05-04 | Disposition: A | Discharge status: Home / Self Care | Payer: Medicare Other | Source: Ambulatory Visit | Attending: Otolaryngology | Admitting: Otolaryngology

## 2017-05-04 DIAGNOSIS — J3801 Paralysis of vocal cords and larynx, unilateral: Secondary | ICD-10-CM

## 2017-05-04 MED ORDER — IOPAMIDOL (ISOVUE-300) INJECTION 61%: 75.0000 mL | Freq: Once | INTRAVENOUS | Status: DC | PRN

## 2017-05-12 ENCOUNTER — Ambulatory Visit (INDEPENDENT_AMBULATORY_CARE_PROVIDER_SITE_OTHER): Payer: Medicare Other | Admitting: Podiatry

## 2017-05-12 ENCOUNTER — Encounter: Payer: Self-pay | Admitting: Podiatry

## 2017-05-12 DIAGNOSIS — M79676 Pain in unspecified toe(s): Secondary | ICD-10-CM

## 2017-05-12 DIAGNOSIS — B351 Tinea unguium: Secondary | ICD-10-CM | POA: Diagnosis not present

## 2017-05-12 NOTE — Progress Notes (Signed)
Complaint:  Visit Type: Patient returns to my office for continued preventative foot care services. Complaint: Patient states" my nails have grown long and thick and become painful to walk and wear shoes" Patient has been diagnosed with DM with no foot complications. The patient presents for preventative foot care services. No changes to ROS  Podiatric Exam: Vascular: dorsalis pedis and posterior tibial pulses are palpable bilateral. Capillary return is immediate. Temperature gradient is WNL. Skin turgor WNL  Sensorium: Normal Semmes Weinstein monofilament test. Normal tactile sensation bilaterally. Nail Exam: Pt has thick disfigured discolored nails with subungual debris noted bilateral entire nail hallux through fifth toenails Ulcer Exam: There is no evidence of ulcer or pre-ulcerative changes or infection. Orthopedic Exam: Muscle tone and strength are WNL. No limitations in general ROM. No crepitus or effusions noted. Foot type and digits show no abnormalities. Bony prominences are unremarkable. Skin: No Porokeratosis. No infection or ulcers  Diagnosis:  Onychomycosis, , Pain in right toe, pain in left toes  Treatment & Plan Procedures and Treatment: Consent by patient was obtained for treatment procedures. The patient understood the discussion of treatment and procedures well. All questions were answered thoroughly reviewed. Debridement of mycotic and hypertrophic toenails, 1 through 5 bilateral and clearing of subungual debris. No ulceration, no infection noted.  Return Visit-Office Procedure: Patient instructed to return to the office for a follow up visit 3 months for continued evaluation and treatment.    Sieara Bremer DPM 

## 2017-05-24 ENCOUNTER — Ambulatory Visit: Payer: Medicare Other | Admitting: Endocrinology

## 2017-05-24 DIAGNOSIS — Z0289 Encounter for other administrative examinations: Secondary | ICD-10-CM

## 2017-05-26 NOTE — Telephone Encounter (Signed)
Error

## 2017-05-26 NOTE — Telephone Encounter (Signed)
error 

## 2017-05-30 ENCOUNTER — Other Ambulatory Visit: Payer: Self-pay | Admitting: Otolaryngology

## 2017-05-31 ENCOUNTER — Ambulatory Visit (INDEPENDENT_AMBULATORY_CARE_PROVIDER_SITE_OTHER): Payer: Medicare Other | Admitting: *Deleted

## 2017-05-31 DIAGNOSIS — I495 Sick sinus syndrome: Secondary | ICD-10-CM

## 2017-05-31 NOTE — Progress Notes (Signed)
Remote pacemaker transmission.   

## 2017-05-31 NOTE — Pre-Procedure Instructions (Signed)
[name] [name]  05/31/2017      [facility] - [place] - [address] [place] [address] Phone: [phone] Fax: [phone]  [id number] - [place] [place] [place] [address] Phone: [phone] Fax: [place], [place] [address] [place] [address] Phone: [phone] Fax: [phone]    Your procedure is scheduled on August 1  Report to [facility] at 0930 A.M.  Call this number if you have problems the morning of surgery:  [phone]   Remember:  Do not eat food or drink liquids after midnight.   Take these medicines the morning of surgery with A SIP OF WATER pantoprazole (PROTONIX)  7 days prior to surgery STOP taking any Aspirin, Aleve, Naproxen, Ibuprofen, Motrin, Advil, Goody's, BC's, all herbal medications, fish oil, and all vitamins  Follow physicians instructions about plavix   Do not wear jewelry  Do not wear lotions, powders, or cologne, or deoderant.  Men may shave face and neck.  Do not bring valuables to the hospital.  [facility] is not responsible for any belongings or valuables.  Contacts, dentures or bridgework may not be worn into surgery.  Leave your suitcase in the car.  After surgery it may be brought to your room.  For patients admitted to the hospital, discharge time will be determined by your treatment team.  Patients discharged the day of surgery will not be allowed to drive home.    Special instructions:   [facility]- Preparing For Surgery  Before surgery, you can play an important role. Because skin is not sterile, your skin needs to be as free of germs as possible. You can reduce the number of germs on your skin by washing with CHG (chlorahexidine gluconate) Soap before surgery.  CHG  is an antiseptic cleaner which kills germs and bonds with the skin to continue killing germs even after washing.  Please do not use if you have an allergy to CHG or antibacterial soaps. If your skin becomes reddened/irritated stop using the CHG.  Do not shave (including legs and underarms) for at least 48 hours prior to first CHG shower. It is OK to shave your face.  Please follow these instructions carefully.   1. Shower the NIGHT BEFORE SURGERY and the MORNING OF SURGERY with CHG.   2. If you chose to wash your hair, wash your hair first as usual with your normal shampoo.  3. After you shampoo, rinse your hair and body thoroughly to remove the shampoo.  4. Use CHG as you would any other liquid soap. You can apply CHG directly to the skin and wash gently with a scrungie or a clean washcloth.   5. Apply the CHG Soap to your body ONLY FROM THE NECK DOWN.  Do not use on open wounds or open sores. Avoid contact with your eyes, ears, mouth and genitals (private parts). Wash genitals (private parts) with your normal soap.  6. Wash thoroughly, paying special attention to the area where your surgery will be performed.  7. Thoroughly rinse your body with warm water from the neck down.  8. DO NOT shower/wash with your normal soap after using and rinsing off the CHG Soap.  9. Pat yourself dry with a CLEAN TOWEL.   10. Wear CLEAN PAJAMAS  11. Place CLEAN SHEETS on your bed the night of your first shower and DO NOT SLEEP WITH PETS.    Day of Surgery: Do not apply any deodorants/lotions. Please wear clean clothes to the hospital/surgery center.      Please read over the following fact sheets that you were given.

## 2017-06-01 ENCOUNTER — Encounter (HOSPITAL_COMMUNITY): Payer: Self-pay

## 2017-06-01 ENCOUNTER — Telehealth: Payer: Self-pay | Admitting: Cardiovascular Disease

## 2017-06-01 ENCOUNTER — Encounter (HOSPITAL_COMMUNITY)
Admission: RE | Admit: 2017-06-01 | Discharge: 2017-06-01 | Disposition: A | Discharge status: Home / Self Care | Payer: Medicare Other | Source: Ambulatory Visit | Attending: Otolaryngology | Admitting: Otolaryngology

## 2017-06-01 ENCOUNTER — Encounter: Payer: Self-pay | Admitting: Cardiovascular Disease

## 2017-06-01 ENCOUNTER — Encounter: Payer: Self-pay | Admitting: Cardiology

## 2017-06-01 DIAGNOSIS — Z01818 Encounter for other preprocedural examination: Secondary | ICD-10-CM | POA: Insufficient documentation

## 2017-06-01 HISTORY — DX: Malignant neoplasm of prostate: C61

## 2017-06-01 HISTORY — DX: Personal history of other diseases of the digestive system: Z87.19

## 2017-06-01 LAB — BASIC METABOLIC PANEL
ANION GAP: 7 (ref 5–15)
BUN: 23 mg/dL — ABNORMAL HIGH (ref 6–20)
CALCIUM: 8.7 mg/dL — AB (ref 8.9–10.3)
CO2: 27 mmol/L (ref 22–32)
CREATININE: 0.95 mg/dL (ref 0.61–1.24)
Chloride: 107 mmol/L (ref 101–111)
Glucose, Bld: 108 mg/dL — ABNORMAL HIGH (ref 65–99)
Potassium: 3.9 mmol/L (ref 3.5–5.1)
SODIUM: 141 mmol/L (ref 135–145)

## 2017-06-01 LAB — CBC
HCT: 32.5 % — ABNORMAL LOW (ref 39.0–52.0)
Hemoglobin: 10.5 g/dL — ABNORMAL LOW (ref 13.0–17.0)
MCH: 29.8 pg (ref 26.0–34.0)
MCHC: 32.3 g/dL (ref 30.0–36.0)
MCV: 92.3 fL (ref 78.0–100.0)
Platelets: 162 10*3/uL (ref 150–400)
RBC: 3.52 MIL/uL — AB (ref 4.22–5.81)
RDW: 15.1 % (ref 11.5–15.5)
WBC: 5.2 10*3/uL (ref 4.0–10.5)

## 2017-06-01 NOTE — Telephone Encounter (Signed)
Clearance has been brought to the attention of Dr. [name]. He will send clearance via epic.

## 2017-06-01 NOTE — Progress Notes (Addendum)
PCP: Dr. [name] Cardiologist: Dr.Critora  No one has instructed pt. When to stop plavix. Notified Langley Adie, P.A. She will follow up with Dr. Redmond Baseman' office.

## 2017-06-01 NOTE — Telephone Encounter (Signed)
Need this clearnace today please

## 2017-06-01 NOTE — Progress Notes (Signed)
Anesthesia PAT Evaluation: Patient is an 83 year old male scheduled for  left medialization laryngoplasty on 06/07/2017 by Dr. Melida Quitter. He has had hoarseness with left vocal cord paralysis. Case is posted for local with MAC.  History includes severe AS s/p TAVR (29 mm Edwards Sapien) 04/22/14, afib, CAD/severe MR s/p CABG (SVG-LAD) with MVR (St. Jude Biocor tissue) and Maze procedure 06/13/08 (Dr. Amador [name], [facility]), s/p rotational atherectomy/DES LAD 03/24/14, ischemic cardiomyopathy, pulmonary hypertension (PA pressure 53 mmHg), CHF, tachybradycardia syndrome s/p Medtronic pacemaker '09, drug (amiodarone) induced hyperthyroidism (s/p methimazole), dyspnea, edema, anemia, GI bleed (on full anticoagulants), hiatal hernia, prostate cancer s/p radiation, tonsillectomy, skin cancer excision, former smoker (quit '67), kyphoscoliosis. Hospitalized 04/25/17-04/27/17 for acute diastolic CHF.   - PCP/Endocrinologist is Dr. Renato [name]. - Cardiologist is Dr.[name] [name], last seen on 03/01/17 with one year follow-up recommended. Patient is on Plavix monotherapy after having a severe GI bleed on full dose anticoagulants. He feels patient severe kyphoscoliosis and very large diaphragmatic hernia made are contributing to his shortness of breath. On 06/01/17 he wrote (see letters tab), "[name] [name] is at low risk, from a cardiac standpoint, for the upcoming procedure.  The following medications can be held 5 days prior to the procedure:  Clopidogrel. Mr. [name] [name] has permanent atrial fibrillation and has a single chamber pacemaker (Medtronic). The planned surgery is unlikely to seriously interfere with normal device function and the patient is not device dependent. If electrocautery will be used extensively, it is reasonable to temporarily place a magnet on the device during the procedure."  Meds include Plavix, Lasix, Protonix, Zocor, Flomax, KCl. Per [name] [name], patient may continue Plavix. She will verify with Dr. Redmond [name] and  let patient know if any change in instructions.   BP (!) 148/93   Pulse 100   Temp 36.4 C   Resp 20   Ht _0  (1.778 m)   Wt 157 lb 8 oz (71.4 kg)   SpO2 96%   BMI 22.60 kg/m  HR after PAT visit was 56 bpm.  Patient present with his wife. They were both able to walk to PAT from the hospital entrance independently. [facility] at [facility]independent living [facility] facility[facility] His voice is soft, hoarse. Mouth opening okay. PPM is left sided. Heart is irregularly irregular. Lungs overall clear. Mild pretibial edema bilaterally. He denied chest pain. He does not use home oxygen. He has chronic SOB worse for past several months, but doing a little better since discharge--no acute changes. He is overall compliant with Lasix unless he has to be traveling (ie, doctor visits). He does not weight himself regularly. He says that he usually lies on his side, but can lie flat with one pillow if needed.   EKG 04/25/17: Afib at 100 bpm, anteroseptal infarct (old), repolarization abnormality suggests ischemia, diffuse leads. Baseline wanderer and motion artifact noted.   Echo 12/18/16: Study Conclusions - Left ventricle: The cavity size was normal. Wall thickness was   increased in a pattern of moderate LVH. Systolic function was   normal. The estimated ejection fraction was in the range of 55%   to 65%. - Aortic valve: A bioprosthesis was present. There was trivial   regurgitation. Valve area (VTI): 2.11 cm^2. Valve area (Vmax):   2.03 cm^2. Valve area (Vmean): 2.13 cm^2. - Mitral valve: A bioprosthesis was present. Valve area by pressure   half-time: 1.62 cm^2. Valve area by continuity equation (using   LVOT flow): 1.21 cm^2. - Left atrium: The atrium was severely  dilated. - Right atrium: The atrium was mildly dilated. - Tricuspid valve: There was moderate regurgitation. - Pulmonary arteries: Systolic pressure was moderately increased.   PA peak pressure: 53 mm Hg (S).  RHC/LHC 01/08/14  (PRE-TAVR):  IMPRESSIONS:  1. moderate aortic stenosis 2. moderate mitral stenosis (iatrogenic status post mitral valve replacement) 3. extensive coronary disease involving both native and graft vessels - interval occlusion of the saphenous vein graft bypass to the LAD artery with progression of the ostial LAD stenosis, which is now critical  PCI 03/24/14:  Conclusions: Successful PCI of the proximal LAD with a DES using adjunctive rotational atherectomy.   CXR 04/25/17: FINDINGS: Prior CABG. Cardiac pacer with lead tips in right atrium and right ventricle. Prior cardiac valve replacement. Cardiomegaly. Pulmonary vascular prominence and interstitial prominence consistent with CHF. Right mid lung field atelectasis and or infiltrate. Small bilateral pleural effusions. Stable left clavicular fracture. Prominent sliding hiatal hernia. IMPRESSION: 1. Cardiac pacer with lead tips in right atrium and right ventricle. Prior cardiac valve replacement. Cardiomegaly with pulmonary vascular prominence and interstitial prominence. Small bilateral pleural effusions. Findings suggest mild CHF. 2.  Mild right mid lung field atelectasis and/or infiltrate. 3. Prominent sliding hiatal hernia .  CT neck 05/04/17: IMPRESSION: 1. Left vocal cord paralysis. Similar findings may have been present on the 2016 cervical spine CT although less prevalent. No mass or lesion along the course of left cranial nerve 10 or recurrent laryngeal nerve. No exophytic mucosal lesion of aerodigestive tract identified. 2. Ascending thoracic aorta measures 4 cm. Recommend annual imaging followup by CTA or MRA. This recommendation follows 2010 ACCF/AHA/AATS/ACR/ASA/SCA/SCAI/SIR/STS/SVM Guidelines for the Diagnosis and Management of Patients with Thoracic Aortic Disease. Circulation. 2010; 121: K820-U015. 3. Moderate cervical spondylosis greatest at C5 through C7 levels. 4. Moderate right pleural effusion. 5. Mildly patulous  and fluid-filled upper mediastinal esophagus.  Preoperative labs noted. Cr 0.95. Glucose 108. H/H 10.5/32.5. TSH and free T4 WNL 04/26/17.  I had reviewed with anesthesiologist Dr. Therisa Doyne prior to receiving cardiac clearance. If no acute changes then it is anticipated that he can proceed as planned. Patient tells me that he was told that he would stay overnight after surgery.  George Hugh [facility] Short Stay Center/Anesthesiology Phone [phone] 06/01/2017 7:09 PM

## 2017-06-01 NOTE — Telephone Encounter (Signed)
° °  [facility] Pre-operative Risk Assessment    Request for surgical clearance:  1. What type of surgery is being performed? Left Medialization Laryngoplasty -Need this back today please  2. When is this surgery scheduled? 06-07-17   3. Are there any medications that need to be held prior to surgery and how long?Can pt stop Plavix(tomorrow)5 days prior to surgery   4. Name of physician performing surgery?Dr Melida Quitter   5. What is your office phone and fax number? [phone] and fax is [phone]  Glyn Ade 06/01/2017, 3:01 PM  _________________________________________________________________   (provider comments below)

## 2017-06-02 ENCOUNTER — Telehealth: Payer: Self-pay | Admitting: Cardiovascular Disease

## 2017-06-02 NOTE — Telephone Encounter (Signed)
°  Follow Up   Calling to follow up on clearance sent yesterday 06/01/17. Please call.

## 2017-06-02 NOTE — Telephone Encounter (Signed)
Spoke with [name] [name] and notified her that clearance was sent yesterday. Re-sent via EPIC

## 2017-06-05 NOTE — Progress Notes (Signed)
Device form (order)  rec'd & on chart

## 2017-06-07 ENCOUNTER — Observation Stay (HOSPITAL_COMMUNITY)
Admission: RE | Admit: 2017-06-07 | Discharge: 2017-06-08 | Disposition: A | Discharge status: Home / Self Care | Payer: Medicare Other | Source: Ambulatory Visit | Attending: Otolaryngology | Admitting: Otolaryngology

## 2017-06-07 ENCOUNTER — Encounter (HOSPITAL_COMMUNITY)
Admission: RE | Disposition: A | Discharge status: Home / Self Care | Payer: Self-pay | Source: Ambulatory Visit | Attending: Otolaryngology

## 2017-06-07 ENCOUNTER — Ambulatory Visit (HOSPITAL_COMMUNITY): Payer: Medicare Other | Admitting: Certified Registered Nurse Anesthetist

## 2017-06-07 ENCOUNTER — Encounter (HOSPITAL_COMMUNITY): Payer: Self-pay | Admitting: Certified Registered Nurse Anesthetist

## 2017-06-07 ENCOUNTER — Ambulatory Visit (HOSPITAL_COMMUNITY): Payer: Medicare Other | Admitting: Vascular Surgery

## 2017-06-07 DIAGNOSIS — Z87891 Personal history of nicotine dependence: Secondary | ICD-10-CM | POA: Insufficient documentation

## 2017-06-07 DIAGNOSIS — I255 Ischemic cardiomyopathy: Secondary | ICD-10-CM | POA: Diagnosis not present

## 2017-06-07 DIAGNOSIS — R490 Dysphonia: Secondary | ICD-10-CM | POA: Diagnosis not present

## 2017-06-07 DIAGNOSIS — Z8673 Personal history of transient ischemic attack (TIA), and cerebral infarction without residual deficits: Secondary | ICD-10-CM | POA: Insufficient documentation

## 2017-06-07 DIAGNOSIS — Z951 Presence of aortocoronary bypass graft: Secondary | ICD-10-CM | POA: Insufficient documentation

## 2017-06-07 DIAGNOSIS — J3801 Paralysis of vocal cords and larynx, unilateral: Secondary | ICD-10-CM | POA: Diagnosis not present

## 2017-06-07 DIAGNOSIS — G2581 Restless legs syndrome: Secondary | ICD-10-CM | POA: Insufficient documentation

## 2017-06-07 DIAGNOSIS — F419 Anxiety disorder, unspecified: Secondary | ICD-10-CM | POA: Insufficient documentation

## 2017-06-07 DIAGNOSIS — Z953 Presence of xenogenic heart valve: Secondary | ICD-10-CM | POA: Diagnosis not present

## 2017-06-07 DIAGNOSIS — I110 Hypertensive heart disease with heart failure: Secondary | ICD-10-CM | POA: Insufficient documentation

## 2017-06-07 DIAGNOSIS — I482 Chronic atrial fibrillation: Secondary | ICD-10-CM | POA: Insufficient documentation

## 2017-06-07 DIAGNOSIS — I872 Venous insufficiency (chronic) (peripheral): Secondary | ICD-10-CM | POA: Diagnosis not present

## 2017-06-07 DIAGNOSIS — Z7901 Long term (current) use of anticoagulants: Secondary | ICD-10-CM | POA: Insufficient documentation

## 2017-06-07 DIAGNOSIS — E0590 Thyrotoxicosis, unspecified without thyrotoxic crisis or storm: Secondary | ICD-10-CM | POA: Insufficient documentation

## 2017-06-07 DIAGNOSIS — K219 Gastro-esophageal reflux disease without esophagitis: Secondary | ICD-10-CM | POA: Diagnosis not present

## 2017-06-07 DIAGNOSIS — Z8546 Personal history of malignant neoplasm of prostate: Secondary | ICD-10-CM | POA: Insufficient documentation

## 2017-06-07 DIAGNOSIS — E785 Hyperlipidemia, unspecified: Secondary | ICD-10-CM | POA: Insufficient documentation

## 2017-06-07 DIAGNOSIS — I252 Old myocardial infarction: Secondary | ICD-10-CM | POA: Diagnosis not present

## 2017-06-07 DIAGNOSIS — Z85828 Personal history of other malignant neoplasm of skin: Secondary | ICD-10-CM | POA: Diagnosis not present

## 2017-06-07 DIAGNOSIS — Z7902 Long term (current) use of antithrombotics/antiplatelets: Secondary | ICD-10-CM | POA: Insufficient documentation

## 2017-06-07 DIAGNOSIS — I2510 Atherosclerotic heart disease of native coronary artery without angina pectoris: Secondary | ICD-10-CM | POA: Insufficient documentation

## 2017-06-07 DIAGNOSIS — I509 Heart failure, unspecified: Secondary | ICD-10-CM | POA: Insufficient documentation

## 2017-06-07 HISTORY — PX: LARYNGOPLASTY: SHX282

## 2017-06-07 SURGERY — LARYNGOPLASTY
Anesthesia: Monitor Anesthesia Care | Site: Throat | Laterality: Left

## 2017-06-07 MED ORDER — FUROSEMIDE 80 MG PO TABS
80.0000 mg | ORAL_TABLET | ORAL | Status: DC
  Administered 2017-06-08: 80 mg via ORAL
  Filled 2017-06-07: qty 1

## 2017-06-07 MED ORDER — 0.9 % SODIUM CHLORIDE (POUR BTL) OPTIME
TOPICAL | Status: DC | PRN
  Administered 2017-06-07: 1000 mL

## 2017-06-07 MED ORDER — DEXMEDETOMIDINE HCL IN NACL 200 MCG/50ML IV SOLN
INTRAVENOUS | Status: DC | PRN
  Administered 2017-06-07: .5 ug/kg/h via INTRAVENOUS

## 2017-06-07 MED ORDER — CEFAZOLIN SODIUM 1 G IJ SOLR
INTRAMUSCULAR | Status: AC
  Filled 2017-06-07: qty 20

## 2017-06-07 MED ORDER — OXYMETAZOLINE HCL 0.05 % NA SOLN
NASAL | Status: AC
  Filled 2017-06-07: qty 15

## 2017-06-07 MED ORDER — LACTATED RINGERS IV SOLN
INTRAVENOUS | Status: DC | PRN
  Administered 2017-06-07: 10:00:00 via INTRAVENOUS

## 2017-06-07 MED ORDER — CEFAZOLIN SODIUM-DEXTROSE 2-3 GM-% IV SOLR
INTRAVENOUS | Status: DC | PRN
  Administered 2017-06-07: 2 g via INTRAVENOUS

## 2017-06-07 MED ORDER — PANTOPRAZOLE SODIUM 40 MG PO TBEC
40.0000 mg | DELAYED_RELEASE_TABLET | Freq: Every day | ORAL | Status: DC
  Administered 2017-06-08: 40 mg via ORAL
  Filled 2017-06-07: qty 1

## 2017-06-07 MED ORDER — LIDOCAINE-EPINEPHRINE 1 %-1:100000 IJ SOLN
INTRAMUSCULAR | Status: AC
  Filled 2017-06-07: qty 1

## 2017-06-07 MED ORDER — TAMSULOSIN HCL 0.4 MG PO CAPS
0.4000 mg | ORAL_CAPSULE | Freq: Every day | ORAL | Status: DC
  Administered 2017-06-07: 0.4 mg via ORAL
  Filled 2017-06-07: qty 1

## 2017-06-07 MED ORDER — OXYMETAZOLINE HCL 0.05 % NA SOLN
NASAL | Status: DC | PRN
  Administered 2017-06-07: 1

## 2017-06-07 MED ORDER — FENTANYL CITRATE (PF) 100 MCG/2ML IJ SOLN
INTRAMUSCULAR | Status: DC | PRN
  Administered 2017-06-07: 25 ug via INTRAVENOUS

## 2017-06-07 MED ORDER — DEXMEDETOMIDINE HCL IN NACL 200 MCG/50ML IV SOLN
INTRAVENOUS | Status: AC
  Filled 2017-06-07: qty 50

## 2017-06-07 MED ORDER — ADULT MULTIVITAMIN W/MINERALS CH
1.0000 | ORAL_TABLET | Freq: Every day | ORAL | Status: DC
  Administered 2017-06-08: 1 via ORAL
  Filled 2017-06-07: qty 1

## 2017-06-07 MED ORDER — KCL IN DEXTROSE-NACL 20-5-0.45 MEQ/L-%-% IV SOLN
INTRAVENOUS | Status: DC
  Administered 2017-06-07: 16:00:00 via INTRAVENOUS
  Filled 2017-06-07: qty 1000

## 2017-06-07 MED ORDER — DEXTROSE 5 % IV SOLN
500.0000 mg | Freq: Three times a day (TID) | INTRAVENOUS | Status: AC
  Administered 2017-06-07 – 2017-06-08 (×3): 500 mg via INTRAVENOUS
  Filled 2017-06-07 (×3): qty 5

## 2017-06-07 MED ORDER — FENTANYL CITRATE (PF) 250 MCG/5ML IJ SOLN
INTRAMUSCULAR | Status: AC
  Filled 2017-06-07: qty 5

## 2017-06-07 MED ORDER — SIMVASTATIN 40 MG PO TABS
40.0000 mg | ORAL_TABLET | Freq: Every day | ORAL | Status: DC
  Administered 2017-06-07: 40 mg via ORAL
  Filled 2017-06-07: qty 1

## 2017-06-07 MED ORDER — HYDROCODONE-ACETAMINOPHEN 5-325 MG PO TABS: 1.0000 | ORAL_TABLET | ORAL | Status: DC | PRN

## 2017-06-07 MED ORDER — LIDOCAINE-EPINEPHRINE 1 %-1:100000 IJ SOLN
INTRAMUSCULAR | Status: DC | PRN
  Administered 2017-06-07: 5 mL

## 2017-06-07 MED ORDER — FUROSEMIDE 40 MG PO TABS: 40.0000 mg | ORAL_TABLET | ORAL | Status: DC

## 2017-06-07 MED ORDER — PROPOFOL 10 MG/ML IV BOLUS
INTRAVENOUS | Status: DC | PRN
  Administered 2017-06-07 (×3): 10 mg via INTRAVENOUS

## 2017-06-07 MED ORDER — MORPHINE SULFATE (PF) 4 MG/ML IV SOLN: 2.0000 mg | INTRAVENOUS | Status: DC | PRN

## 2017-06-07 MED ORDER — POTASSIUM CHLORIDE CRYS ER 20 MEQ PO TBCR
20.0000 meq | EXTENDED_RELEASE_TABLET | Freq: Every day | ORAL | Status: DC
  Administered 2017-06-08: 20 meq via ORAL
  Filled 2017-06-07: qty 1

## 2017-06-07 MED ORDER — SODIUM CHLORIDE 0.9 % IJ SOLN
INTRAMUSCULAR | Status: AC
  Filled 2017-06-07: qty 20

## 2017-06-07 MED ORDER — FENTANYL CITRATE (PF) 100 MCG/2ML IJ SOLN: 25.0000 ug | INTRAMUSCULAR | Status: DC | PRN

## 2017-06-07 MED ORDER — MORPHINE SULFATE (PF) 2 MG/ML IV SOLN: 2.0000 mg | INTRAVENOUS | Status: DC | PRN

## 2017-06-07 SURGICAL SUPPLY — 40 items
ADH SKN CLS APL DERMABOND .7 (GAUZE/BANDAGES/DRESSINGS) ×1
APL SKNCLS STERI-STRIP NONHPOA (GAUZE/BANDAGES/DRESSINGS) ×1
BENZOIN TINCTURE PRP APPL 2/3 (GAUZE/BANDAGES/DRESSINGS) ×3 IMPLANT
BLADE SCALPEL THERMAL 15 (MISCELLANEOUS) ×3 IMPLANT
BLOCK SILICONE 1.91X1.91 (MISCELLANEOUS) ×2 IMPLANT
BUR ROUND FLUTED 4 SOFT TCH (BURR) ×2 IMPLANT
BUR ROUND FLUTED 4MM SOFT TCH (BURR) ×1
CANISTER SUCT 3000ML PPV (MISCELLANEOUS) ×3 IMPLANT
CLEANER TIP ELECTROSURG 2X2 (MISCELLANEOUS) ×3 IMPLANT
COVER SURGICAL LIGHT HANDLE (MISCELLANEOUS) ×3 IMPLANT
CRADLE DONUT ADULT HEAD (MISCELLANEOUS) ×3 IMPLANT
DERMABOND ADVANCED (GAUZE/BANDAGES/DRESSINGS) ×2
DERMABOND ADVANCED .7 DNX12 (GAUZE/BANDAGES/DRESSINGS) ×1 IMPLANT
DRAIN HEMOVAC 7FR (DRAIN) ×3 IMPLANT
DRAPE HALF SHEET 40X57 (DRAPES) ×3 IMPLANT
DRAPE SURG 17X23 STRL (DRAPES) ×3 IMPLANT
ELECT COATED BLADE 2.86 ST (ELECTRODE) ×3 IMPLANT
ELECT NDL BLADE 2-5/6 (NEEDLE) ×1 IMPLANT
ELECT NEEDLE BLADE 2-5/6 (NEEDLE) ×3 IMPLANT
ELECT REM PT RETURN 9FT ADLT (ELECTROSURGICAL) ×3
ELECTRODE REM PT RTRN 9FT ADLT (ELECTROSURGICAL) ×1 IMPLANT
EVACUATOR SILICONE 100CC (DRAIN) ×3 IMPLANT
GLOVE BIO SURGEON STRL SZ7.5 (GLOVE) ×3 IMPLANT
GOWN STRL REUS W/ TWL LRG LVL3 (GOWN DISPOSABLE) ×2 IMPLANT
GOWN STRL REUS W/TWL LRG LVL3 (GOWN DISPOSABLE) ×6
KIT BASIN OR (CUSTOM PROCEDURE TRAY) ×3 IMPLANT
KIT ROOM TURNOVER OR (KITS) ×3 IMPLANT
NDL HYPO 25GX1X1/2 BEV (NEEDLE) ×1 IMPLANT
NEEDLE HYPO 25GX1X1/2 BEV (NEEDLE) ×3 IMPLANT
NS IRRIG 1000ML POUR BTL (IV SOLUTION) ×3 IMPLANT
PAD ARMBOARD 7.5X6 YLW CONV (MISCELLANEOUS) ×6 IMPLANT
PATTIES SURGICAL .5 X3 (DISPOSABLE) ×3 IMPLANT
PENCIL BUTTON HOLSTER BLD 10FT (ELECTRODE) ×3 IMPLANT
SOLUTION ANTI FOG 6CC (MISCELLANEOUS) ×2 IMPLANT
SPONGE INTESTINAL PEANUT (DISPOSABLE) ×3 IMPLANT
SUT ETHILON 2 0 FS 18 (SUTURE) ×12 IMPLANT
SUT VIC AB 3-0 SH 27 (SUTURE) ×6
SUT VIC AB 3-0 SH 27X BRD (SUTURE) ×2 IMPLANT
SUT VICRYL 4-0 PS2 18IN ABS (SUTURE) ×2 IMPLANT
TRAY ENT MC OR (CUSTOM PROCEDURE TRAY) ×3 IMPLANT

## 2017-06-07 NOTE — Anesthesia Postprocedure Evaluation (Signed)
Anesthesia Post Note  Patient: [name] [name]  Procedure(s) Performed: Procedure(s) (LRB): LARYNGOPLASTY (Left)     Patient location during evaluation: PACU Anesthesia Type: MAC Level of consciousness: awake Pain management: pain level controlled Vital Signs Assessment: post-procedure vital signs reviewed and stable Respiratory status: spontaneous breathing Cardiovascular status: stable Anesthetic complications: no    Last Vitals:  Vitals:   06/07/17 0958 06/07/17 1344  BP: (!) 154/97 131/79  Pulse: (!) 109 68  Resp: 16 (!) 28  Temp: 37.2 C (!) 36.2 C    Last Pain:  Vitals:   06/07/17 0958  TempSrc: Oral                 Agastya Meister

## 2017-06-07 NOTE — Anesthesia Preprocedure Evaluation (Addendum)
Anesthesia Evaluation  Patient identified by MRN, date of birth, ID band Patient awake    Reviewed: Allergy & Precautions, NPO status , Patient's Chart, lab work & pertinent test results  History of Anesthesia Complications (+) Emergence Delirium  Airway Mallampati: II  TM Distance: >3 FB Neck ROM: Full    Dental  (+) Teeth Intact, Dental Advisory Given   Pulmonary shortness of breath, pneumonia, former smoker,    breath sounds clear to auscultation       Cardiovascular hypertension, + CAD, + Past MI, + Peripheral Vascular Disease and +CHF  + dysrhythmias + pacemaker  Rhythm:Regular Rate:Normal     Neuro/Psych    GI/Hepatic Neg liver ROS, hiatal hernia, GERD  ,  Endo/Other  Hyperthyroidism   Renal/GU      Musculoskeletal   Abdominal   Peds  Hematology  (+) anemia ,   Anesthesia Other Findings   Reproductive/Obstetrics                            Anesthesia Physical Anesthesia Plan  ASA: IV  Anesthesia Plan: MAC   Post-op Pain Management:    Induction: Intravenous  PONV Risk Score and Plan: 1 and Ondansetron and Dexamethasone  Airway Management Planned: Nasal Cannula  Additional Equipment:   Intra-op Plan:   Post-operative Plan:   Informed Consent:   Dental advisory given  Plan Discussed with: CRNA and Anesthesiologist  Anesthesia Plan Comments:        Anesthesia Quick Evaluation

## 2017-06-07 NOTE — Anesthesia Procedure Notes (Signed)
Procedure Name: MAC Date/Time: 06/07/2017 12:25 PM Performed by: Garrison Columbus T Pre-anesthesia Checklist: Emergency Drugs available, Suction available, Patient being monitored and Patient identified Patient Re-evaluated:Patient Re-evaluated prior to induction Oxygen Delivery Method: Nasal cannula Preoxygenation: Pre-oxygenation with 100% oxygen Induction Type: IV induction Placement Confirmation: positive ETCO2 and breath sounds checked- equal and bilateral Dental Injury: Teeth and Oropharynx as per pre-operative assessment

## 2017-06-07 NOTE — Progress Notes (Signed)
Patient arrived from PACU to 6N26 at this time. Pt is alert, denied pain, VSS, speech clear. No other distress noted. Will continue to monitor.   Ave Filter, RN

## 2017-06-07 NOTE — Transfer of Care (Cosign Needed)
Immediate Anesthesia Transfer of Care Note  Patient: [name] [name]  Procedure(s) Performed: Procedure(s) with comments: LARYNGOPLASTY (Left) - LEFT MEDIALIZATION LARYNGOPLASTY  LOCAL WITH IV SEDATION  Patient Location: PACU  Anesthesia Type:MAC  Level of Consciousness: awake, alert  and oriented  Airway & Oxygen Therapy: Patient Spontanous Breathing and Patient connected to nasal cannula oxygen  Post-op Assessment: Report given to RN and Post -op Vital signs reviewed and stable  Post vital signs: Reviewed and stable  Last Vitals:  Vitals:   06/07/17 0958  BP: (!) 154/97  Pulse: (!) 109  Resp: 16  Temp: 37.2 C    Last Pain:  Vitals:   06/07/17 0958  TempSrc: Oral      Patients Stated Pain Goal: 5 (67/61/95 0932)  Complications: No apparent anesthesia complications

## 2017-06-07 NOTE — Progress Notes (Signed)
06/07/2017 7:14 PM  Jacklynn Ganong [id number]  Post-Op Check   Temp:  [97.2 F (36.2 C)-98.9 F (37.2 C)] 97.4 F (36.3 C) (08/01 1529) Pulse Rate:  [36-109] 105 (08/01 1529) Resp:  [16-28] 22 (08/01 1529) BP: (124-154)/(67-97) 151/95 (08/01 1529) SpO2:  [92 %-100 %] 92 % (08/01 1529) Weight:  [70.5 kg (155 lb 6 oz)] 70.5 kg (155 lb 6 oz) (08/01 1000),     Intake/Output Summary (Last 24 hours) at 06/07/17 1914 Last data filed at 06/07/17 1543  Gross per 24 hour  Intake              740 ml  Output               18 ml  Net              722 ml  JP 8 ml  No results found for this or any previous visit (from the past 24 hour(s)).  SUBJECTIVE:  Min pain. Breathing well.  Voice much stronger.  spont void  OBJECTIVE:  Voice phonatory.  Neck flat.  Drain functional  IMPRESSION:  Satisfactory check  PLAN:  Routine.  Anticipate drain out and home in AM.  [place], [place]

## 2017-06-07 NOTE — H&P (Addendum)
[name] [name] is an 83 y.o. male.   Chief Complaint: Left vocal fold paralysis HPI: 83 year old male with change in voice following aortic surgery a few years ago that has been a lot worse this year.  He was found to have a paralyzed left vocal fold.  CT imaging was otherwise negative.  He presents for medialization laryngoplasty.  Past Medical History:  Diagnosis Date  . ANEMIA, IRON DEFICIENCY 10/13/2008  . ANXIETY 09/04/2008  . Aortic valvular disorder 08/16/2012   echo EF 45-50% LV mildly reduce,Biocor mitral valve,mild to mod. tricuspid regrug.,mild to mod. aortic regrug.  . Aortic valvular stenosis 11/19/2013  . ATRIAL FIBRILLATION, CHRONIC 01/14/2008   on warfarin since 2007  . BARRETTS ESOPHAGUS 03/20/2009  . BASAL CELL CARCINOMA OF SKIN SITE UNSPECIFIED 09/28/2010  . Cardiomyopathy, ischemic 11/19/2013   EF 45-50% with inferior wall hypokinesis by echo 2014  . CATARACTS, BILATERAL, HX OF 01/14/2008  . CEREBROVASCULAR ACCIDENT, HX OF 08/18/2008  . CHF (congestive heart failure) ([place])   . COAGULOPATHY, COUMADIN-INDUCED 01/14/2008  . COLONIC POLYPS, ADENOMATOUS 01/14/2008  . CORONARY ARTERY DISEASE 06/01/2007   Cath 04/03/2008; Cath 11/01/1990  . Dyspnea    on exertion started couple months ago  . Dysrhythmia    HX OF [place]Y  . Edema, peripheral 07/30/2009   LEV doppler- no thrombus or thrombophlebitis  . Elevated PSA   . GASTRIC POLYP 05/13/2009  . GERD 06/01/2007  . History of hiatal hernia   . Hyperglycemia   . HYPERLIPIDEMIA 06/01/2007  . HYPERTENSION 06/01/2007  . HYPERTHYROIDISM 01/06/2011  . Mitral regurgitation 06/13/2008   mitral valve replacement #20 St. Jude Biocor; Maze procedure by Dr. Amador Cunas  at [facility]  . Mitral stenosis    Porcine bioprosthetic tissue valve placed 06/13/2008  . Mitral valve disorder 09/08/2011   echo EF 50-55% LV normal  . Pacemaker   . Peripheral artery disease ([place]) 06/16/2010   LEA doppler-left ABI nml 0.61 calcified vessels;right ABI  0.68  .  PERSONAL HX COLONIC POLYPS 01/01/2010  . PLEURAL EFFUSION, LEFT 10/13/2008  . Prostate cancer [facility])    s/p radiation  . PROSTATE CANCER, HX OF 01/14/2008  . Prosthetic valve dysfunction    Mitral stenosis involving bioprosthetic tissue valve placed 06/13/2008  . Restless leg syndrome   . S/P CABG x 1 06/13/2008   SVG to LAD with open vein harvest right thigh, LIMA harvested but not utilized by Dr Amador Cunas  . S/P Maze operation for atrial fibrillation 06/13/2008   Partial left atrial lesion set using cryothermy for bilateral pulmonary vein isolation by Dr Amador Cunas  . S/P mitral valve replacement with bioprosthetic valve 06/13/2008   56mm St Jude Biocor porcine bioprosthetic tissue valve by Dr Amador Cunas  . 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 Tachy-brady syndrome ([place]) 07/13/2008   medtronic Adapta gen. model ADDR01 ,serial # [id number] H by Dr  [name] at [facility]  . UNSPECIFIED VENOUS INSUFFICIENCY 09/18/2008    Past Surgical History:  Procedure Laterality Date  . CARDIAC CATHETERIZATION  11/01/1990  . CARDIOVASCULAR STRESS TEST  06/03/2010   Persantine perfusion EF 45% mild to moderate ischemia basal and mid inferolateral region  . CARDIOVASCULAR STRESS TEST  01/01/2008   left ventricle normal,no significant ischemia  . CARDIOVASCULAR STRESS TEST  09/09/2005    possibility of ischemia inferior wall  . COLONOSCOPY  07/14/2009   diverticulosis,  internal hemorrhoids  . CORONARY ARTERY BYPASS GRAFT  06/13/2008   graft x1 w/vein graft LAD artery by Dr. Amador Cunas at [facility]  . CORONARY STENT PLACEMENT  03/24/2014   DES to LAD  . CYSTECTOMY     Left leg  . INTRAOPERATIVE TRANSESOPHAGEAL ECHOCARDIOGRAM N/A 04/22/2014   Procedure: INTRAOPERATIVE TRANSTHORACIC ECHOCARDIOGRAM;  Surgeon: [name], MD;  Location: [place];  Service: Open Heart Surgery;  Laterality: N/A;  . LEFT AND  RIGHT HEART CATHETERIZATION WITH CORONARY ANGIOGRAM N/A 01/08/2014   Procedure: LEFT AND RIGHT HEART CATHETERIZATION WITH CORONARY ANGIOGRAM;  Surgeon: Sanda Klein, MD;  Location: [place] CATH LAB;  Service: Cardiovascular;  Laterality: N/A;  . MITRAL VALVE REPLACEMENT  06/13/2008   #20 St. Jude Bicor mitral valve ;Maze procedure at [facility] by Dr. Amador Cunas  . PACEMAKER PLACEMENT  07/13/2008   [place] dual chamber-Medtronic at [facility]   . PERCUTANEOUS CORONARY ROTOBLATOR INTERVENTION (PCI-R) N/A 03/24/2014   Procedure: PERCUTANEOUS CORONARY ROTOBLATOR INTERVENTION (PCI-R);  Surgeon: Blane Ohara, MD;  Location: [facility] CATH LAB;  Service: Cardiovascular;  Laterality: N/A;  . SKIN CANCER EXCISION     removed from forehead and right leg, nose/face  . TEE WITHOUT CARDIOVERSION N/A 01/22/2014   Procedure: TRANSESOPHAGEAL ECHOCARDIOGRAM (TEE);  Surgeon: Sanda Klein, MD;  Location: [facility] ENDOSCOPY;  Service: Cardiovascular;  Laterality: N/A;  . TONSILLECTOMY    . TRANSCATHETER AORTIC VALVE REPLACEMENT, TRANSFEMORAL N/A 04/22/2014   Procedure: TRANSCATHETER AORTIC VALVE REPLACEMENT, TRANSFEMORAL;  Surgeon: [name], MD;  Location: [place];  Service: Open Heart Surgery;  Laterality: N/A;  TAVR-TF (RIGHT SIDE)  . UPPER GASTROINTESTINAL ENDOSCOPY  07/14/2009   erosive reflux esopagitis, Barrett's esophagus    Family History  Problem Relation Age of Onset  . Breast cancer Mother   . Stroke Mother   . Transient ischemic attack Mother    Social History:  reports that he quit smoking about 51 years ago. His smoking use included Pipe. He quit after 2.00 years of use. He has never used smokeless tobacco. He reports that he does not drink alcohol or use drugs.  Allergies: No Known Allergies  Medications Prior to Admission  Medication Sig Dispense Refill  . clopidogrel (PLAVIX) 75 MG tablet Take 1 tablet (75 mg total) by mouth daily with breakfast. 90 tablet 3  . furosemide (LASIX) 40 MG tablet Take 40-80 mg by mouth  daily. Pt takes one tablet on Monday, Wednesday, and Friday.  Pt takes two tablets on Sunday, Tuesday, Thursday, and Saturday.    . Multiple Vitamin (MULTIVITAMIN WITH MINERALS) TABS Take 1 tablet by mouth daily. Centrum Silver    . pantoprazole (PROTONIX) 40 MG tablet Take 1 tablet (40 mg total) by mouth daily. 90 tablet 2  . potassium chloride SA (K-DUR,KLOR-CON) 20 MEQ tablet Take 20 mEq by mouth daily.    . simvastatin (ZOCOR) 40 MG tablet Take 1 tablet (40 mg total) by mouth at bedtime. 90 tablet 2  . Tamsulosin HCl (FLOMAX) 0.4 MG CAPS Take 0.4 mg by mouth at bedtime.       No results found for this or any previous visit (from the past 48 hour(s)). No results found.  Review of Systems  All other systems reviewed and are negative.   Blood pressure (!) 154/97, pulse (!) 109, temperature 98.9 F (37.2 C), temperature source Oral, resp. rate 16, height 5\' 10"  (1.778 m), weight 155 lb 6 oz (70.5 kg), SpO2 94 %. Physical Exam  Constitutional: He is oriented to person, place, and time.  He appears well-developed and well-nourished. No distress.  HENT:  Head: Normocephalic and atraumatic.  Right Ear: External ear normal.  Left Ear: External ear normal.  Nose: Nose normal.  Mouth/Throat: Oropharynx is clear and moist.  Severely breathy dysphonia.  Eyes: Pupils are equal, round, and reactive to light. Conjunctivae and EOM are normal.  Neck: Normal range of motion. Neck supple.  Cardiovascular: Normal rate.   Respiratory: Effort normal.  Musculoskeletal: Normal range of motion.  Neurological: He is alert and oriented to person, place, and time. No cranial nerve deficit.  Skin: Skin is warm and dry.  Psychiatric: He has a normal mood and affect. His behavior is normal. Judgment and thought content normal.     Assessment/Plan Left vocal fold paralysis To OR for left medialization laryngoplasty.  Melida Quitter, MD 06/07/2017, 11:59 AM

## 2017-06-07 NOTE — Anesthesia Postprocedure Evaluation (Signed)
Anesthesia Post Note  Patient: [name] [name]  Procedure(s) Performed: Procedure(s) (LRB): LARYNGOPLASTY (Left)     Patient location during evaluation: PACU Anesthesia Type: MAC Level of consciousness: awake Pain management: pain level controlled Vital Signs Assessment: post-procedure vital signs reviewed and stable Respiratory status: spontaneous breathing Cardiovascular status: stable Anesthetic complications: no    Last Vitals:  Vitals:   06/07/17 1445 06/07/17 1529  BP: 140/75 (!) 151/95  Pulse: (!) 36 (!) 105  Resp: (!) 26 (!) 22  Temp:  (!) 36.3 C    Last Pain:  Vitals:   06/07/17 1529  TempSrc: Oral  PainSc:                  Tico Crotteau

## 2017-06-07 NOTE — Brief Op Note (Signed)
06/07/2017  1:47 PM  PATIENT:  [name] [name]  83 y.o. male  PRE-OPERATIVE DIAGNOSIS:  LEFT VOCAL CORD PARALYSIS  POST-OPERATIVE DIAGNOSIS:  LEFT VOCAL CORD PARALYSIS  PROCEDURE:  Procedure(s) with comments: LARYNGOPLASTY (Left) - LEFT MEDIALIZATION LARYNGOPLASTY  LOCAL WITH IV SEDATION  SURGEON:  Surgeon(s) and Role:    Melida Quitter, MD - Primary  PHYSICIAN ASSISTANT:   ASSISTANTS: none   ANESTHESIA:   IV sedation  EBL:  Total I/O In: 500 [I.V.:500] Out: -   BLOOD ADMINISTERED:none  DRAINS: (7 fr) Jackson-Pratt drain(s) with closed bulb suction in the neck   LOCAL MEDICATIONS USED:  LIDOCAINE   SPECIMEN:  No Specimen  DISPOSITION OF SPECIMEN:  N/A  COUNTS:  YES  TOURNIQUET:  * No tourniquets in log *  DICTATION: .Other Dictation: Dictation Number [id number]  PLAN OF CARE: Admit for overnight observation  PATIENT DISPOSITION:  PACU - hemodynamically stable.   Delay start of Pharmacological VTE agent (>24hrs) due to surgical blood loss or risk of bleeding: yes

## 2017-06-08 ENCOUNTER — Encounter (HOSPITAL_COMMUNITY): Payer: Self-pay | Admitting: Otolaryngology

## 2017-06-08 DIAGNOSIS — J3801 Paralysis of vocal cords and larynx, unilateral: Secondary | ICD-10-CM | POA: Diagnosis not present

## 2017-06-08 NOTE — Progress Notes (Signed)
Discharge instruction reviewed with patient/family. All questions answered at this time. Transport home by family.  Nyhla Mountjoy, RN 

## 2017-06-08 NOTE — Discharge Summary (Signed)
Physician Discharge Summary  Patient ID: [name] [name] MRN: [id number] DOB/AGE: 1929/05/09 83 y.o.  Admit date: 06/07/2017 Discharge date: 06/08/2017  Admission Diagnoses: Left vocal fold paralysis  Discharge Diagnoses:  Active Problems:   Vocal fold paralysis, left   Discharged Condition: good  Hospital Course: 83 year old male with a few years of hoarseness following aortic surgery that has been worse this year.  He was found to have a paralyzed left vocal fold and presented for surgical management.  See operative note.  He did well overnight with drain in place.  His voice is much stronger.  On POD 1, he was felt stable for discharge and the drain was removed.  Consults: None  Significant Diagnostic Studies: None  Treatments: surgery: Left medialization laryngoplasty  Discharge Exam: Blood pressure 125/63, pulse (!) 45, temperature 97.8 F (36.6 C), temperature source Oral, resp. rate 17, height 5\' 10"  (1.778 m), weight 155 lb 6 oz (70.5 kg), SpO2 96 %. General appearance: alert, cooperative, no distress and strong voice, no breathiness Neck: neck incision clean and intact, no fluid collection, drain removed  Disposition: 01-Home or Self Care  Discharge Instructions    Diet - low sodium heart healthy    Complete by:  As directed    Discharge instructions    Complete by:  As directed    Resume normal diet.  Avoid strenuous activity.  Do not apply ointment to incision.  OK to allow incision to get wet, gently pat dry.  Resume Plavix tomorrow.  Tylenol for pain.   Increase activity slowly    Complete by:  As directed      Allergies as of 06/08/2017   No Known Allergies     Medication List    TAKE these medications   clopidogrel 75 MG tablet Commonly known as:  PLAVIX Take 1 tablet (75 mg total) by mouth daily with breakfast.   furosemide 40 MG tablet Commonly known as:  LASIX Take 40-80 mg by mouth daily. Pt takes one tablet on Monday, Wednesday, and Friday.  Pt takes  two tablets on Sunday, Tuesday, Thursday, and Saturday.   multivitamin with minerals Tabs tablet Take 1 tablet by mouth daily. Centrum Silver   pantoprazole 40 MG tablet Commonly known as:  PROTONIX Take 1 tablet (40 mg total) by mouth daily.   potassium chloride SA 20 MEQ tablet Commonly known as:  K-DUR,KLOR-CON Take 20 mEq by mouth daily.   simvastatin 40 MG tablet Commonly known as:  ZOCOR Take 1 tablet (40 mg total) by mouth at bedtime.   tamsulosin 0.4 MG Caps capsule Commonly known as:  FLOMAX Take 0.4 mg by mouth at bedtime.      Follow-up Information    Melida Quitter, MD. Schedule an appointment as soon as possible for a visit in 2 week(s).   Specialty:  Otolaryngology Contact information: [address] [place] [address] [phone]           Signed: Melida Quitter 06/08/2017, 8:39 AM

## 2017-06-08 NOTE — Op Note (Signed)
NAME:  LLIAM, HOH NO.:  MEDICAL RECORD NO.:  [id number]  LOCATION:                                 FACILITY:  PHYSICIAN:  Onnie Graham, MD          DATE OF BIRTH:  DATE OF PROCEDURE:  06/07/2017 DATE OF DISCHARGE:                              OPERATIVE REPORT   PREOPERATIVE DIAGNOSIS:  Left vocal cord paralysis and dysphonia.  POSTOPERATIVE DIAGNOSIS:  Left vocal cord paralysis and dysphonia.  PROCEDURE:  Left medialization laryngoplasty.  SURGEON:  Onnie Graham, MD.  ANESTHESIA:  IV sedation with local anesthetic.  COMPLICATIONS:  None.  INDICATION:  The patient is an 83 year old male, who had a change in his voice a few years ago following aortic surgery.  It has been a lot worse this year with a very breathy voice, and he was found to have a left vocal fold paralysis.  CT imaging of the neck and upper chest demonstrated no other cause.  He presents to the operating room for surgical management.  FINDINGS:  There were no lesions seen in the larynx on fiberoptic exam during the case.  The left vocal fold was immobile and thin in tone.  DESCRIPTION OF PROCEDURE:  The patient was identified in the holding room and informed consent having been obtained including discussion of risks, benefits, alternatives, the patient was brought to the operative suite and put on the operative table in supine position.  The patient was given nasal cannula oxygen and a bit of IV sedation.  After positioning him with his neck in an exposed position, the incision was marked with a marking pen.  The left nasal passage was draped with Afrin and a fiberoptic laryngoscope was then placed through the left nasal passage and placed in the supraglottic position to the larynx during the case.  This was used with the camera and a monitor.  The camera and scope were laid on a Mayo stand cephalic to his head.  The neck was then prepped and draped in sterile fashion.  With  him under some sedation, the neck was injected then with 1% lidocaine with 1:100,000 epinephrine. The neck incision was made with a 15-blade scalpel through the skin and extended through the subcutaneous and platysma layer using Bovie electrocautery.  The midline raphae was then divided and the strap muscles were elevated off the left side of the larynx.  A hook was used to stabilize the larynx and expose the left thyroid ala.  The window in the larynx was estimated and marked with Bovie electrocautery.  The cartilage was ossified and so a cutting drill bit was then used to create a window through the cartilage.  This was done through the inner periosteum as well or perichondrium.  With the soft tissues of the larynx exposed to the window, a Soil scientist was used to palpate and estimate the level of vocal fold both by visual confirmation on the fiberoptic scope as well as listening to his voice.  At this point, the silastic block was cut using a Shaw blade and carved to fit the needed implant.  Some  adjustments were made in size along the way after trying to place it and having difficulty.  The window was extended more anteriorly a bit with the drill as well.  Ultimately, the implant was able to be placed in good position and the patient's voice was much better.  With slight adjustments made to the depth of the implant with pressure, the voice sounded worse.  Thus, it was felt to be in the best position.  At this point, the neck was copiously irrigated with saline. A 7-French round drain was placed in the depth of the wound and secured to the skin using 2-0 nylon suture in a standard drain stitch.  The platysma layer was closed with 3-0 Vicryl suture in a simple running fashion, and the skin was closed in the subcutaneous layer with 4-0 Vicryl suture in a simple interrupted fashion.  Skin was closed with Dermabond.  The scope was removed during closure.  At this point, drapes were  removed and the patient was cleaned off.  The drain was placed to bulb suction and taped to the right shoulder.  He was then returned to anesthesia for full wake up and was moved to recovery room in stable condition.     Onnie Graham, MD     DDB/MEDQ  D:  06/07/2017  T:  06/07/2017  Job:  [id number]

## 2017-06-13 ENCOUNTER — Emergency Department (HOSPITAL_BASED_OUTPATIENT_CLINIC_OR_DEPARTMENT_OTHER)
Admit: 2017-06-13 | Discharge: 2017-06-13 | Disposition: A | Discharge status: Home / Self Care | Payer: Medicare Other | Attending: Emergency Medicine | Admitting: Emergency Medicine

## 2017-06-13 ENCOUNTER — Emergency Department (HOSPITAL_COMMUNITY)
Admission: EM | Admit: 2017-06-13 | Discharge: 2017-06-13 | Disposition: A | Discharge status: Home / Self Care | Payer: Medicare Other | Attending: Emergency Medicine | Admitting: Emergency Medicine

## 2017-06-13 DIAGNOSIS — I5033 Acute on chronic diastolic (congestive) heart failure: Secondary | ICD-10-CM | POA: Insufficient documentation

## 2017-06-13 DIAGNOSIS — R2242 Localized swelling, mass and lump, left lower limb: Secondary | ICD-10-CM | POA: Diagnosis present

## 2017-06-13 DIAGNOSIS — Z950 Presence of cardiac pacemaker: Secondary | ICD-10-CM | POA: Diagnosis not present

## 2017-06-13 DIAGNOSIS — E785 Hyperlipidemia, unspecified: Secondary | ICD-10-CM | POA: Insufficient documentation

## 2017-06-13 DIAGNOSIS — I4891 Unspecified atrial fibrillation: Secondary | ICD-10-CM | POA: Insufficient documentation

## 2017-06-13 DIAGNOSIS — Z8546 Personal history of malignant neoplasm of prostate: Secondary | ICD-10-CM | POA: Insufficient documentation

## 2017-06-13 DIAGNOSIS — Z951 Presence of aortocoronary bypass graft: Secondary | ICD-10-CM | POA: Diagnosis not present

## 2017-06-13 DIAGNOSIS — M7989 Other specified soft tissue disorders: Secondary | ICD-10-CM

## 2017-06-13 DIAGNOSIS — I110 Hypertensive heart disease with heart failure: Secondary | ICD-10-CM | POA: Diagnosis not present

## 2017-06-13 DIAGNOSIS — Z87891 Personal history of nicotine dependence: Secondary | ICD-10-CM | POA: Insufficient documentation

## 2017-06-13 DIAGNOSIS — M79609 Pain in unspecified limb: Secondary | ICD-10-CM

## 2017-06-13 DIAGNOSIS — Z8673 Personal history of transient ischemic attack (TIA), and cerebral infarction without residual deficits: Secondary | ICD-10-CM | POA: Diagnosis not present

## 2017-06-13 DIAGNOSIS — I2510 Atherosclerotic heart disease of native coronary artery without angina pectoris: Secondary | ICD-10-CM | POA: Insufficient documentation

## 2017-06-13 DIAGNOSIS — Z7901 Long term (current) use of anticoagulants: Secondary | ICD-10-CM | POA: Diagnosis not present

## 2017-06-13 LAB — CBC WITH DIFFERENTIAL/PLATELET
Basophils Absolute: 0 10*3/uL (ref 0.0–0.1)
Basophils Relative: 0 %
EOS ABS: 0.1 10*3/uL (ref 0.0–0.7)
EOS PCT: 1 %
HCT: 34.7 % — ABNORMAL LOW (ref 39.0–52.0)
Hemoglobin: 10.8 g/dL — ABNORMAL LOW (ref 13.0–17.0)
LYMPHS ABS: 0.9 10*3/uL (ref 0.7–4.0)
LYMPHS PCT: 15 %
MCH: 28.7 pg (ref 26.0–34.0)
MCHC: 31.1 g/dL (ref 30.0–36.0)
MCV: 92.3 fL (ref 78.0–100.0)
MONOS PCT: 9 %
Monocytes Absolute: 0.5 10*3/uL (ref 0.1–1.0)
Neutro Abs: 4.4 10*3/uL (ref 1.7–7.7)
Neutrophils Relative %: 75 %
PLATELETS: 165 10*3/uL (ref 150–400)
RBC: 3.76 MIL/uL — AB (ref 4.22–5.81)
RDW: 15.4 % (ref 11.5–15.5)
WBC: 6 10*3/uL (ref 4.0–10.5)

## 2017-06-13 LAB — BASIC METABOLIC PANEL
ANION GAP: 7 (ref 5–15)
BUN: 23 mg/dL — ABNORMAL HIGH (ref 6–20)
CHLORIDE: 107 mmol/L (ref 101–111)
CO2: 27 mmol/L (ref 22–32)
Calcium: 8.5 mg/dL — ABNORMAL LOW (ref 8.9–10.3)
Creatinine, Ser: 0.89 mg/dL (ref 0.61–1.24)
GFR calc Af Amer: >60 mL/min (ref >60)
GFR calc non Af Amer: >60 mL/min (ref >60)
GLUCOSE: 99 mg/dL (ref 65–99)
POTASSIUM: 3.9 mmol/L (ref 3.5–5.1)
Sodium: 141 mmol/L (ref 135–145)

## 2017-06-13 LAB — BRAIN NATRIURETIC PEPTIDE: B Natriuretic Peptide: 286.4 pg/mL — ABNORMAL HIGH (ref 0.0–100.0)

## 2017-06-13 NOTE — Progress Notes (Signed)
*  Preliminary Results* Left lower extremity venous duplex completed. Left lower extremity is negative for acute deep vein thrombosis. There is no evidence of left Baker's cyst.  06/13/2017 8:28 AM  Maudry Mayhew, BS, RVT, RDCS, RDMS

## 2017-06-13 NOTE — ED Notes (Signed)
Patient to vascular.

## 2017-06-13 NOTE — Discharge Instructions (Signed)
You were seen in the ED today with left leg swelling. The ultrasound did not show a blood clot in the leg. Wrap the leg and keep it elevated when at rest.   Return to the ED immediately with any sudden onset chest pain or difficulty breathing. Follow up with your PCP regarding your swelling and to discuss further management.

## 2017-06-13 NOTE — ED Triage Notes (Signed)
Patient comes in per [facility] with LLE edema x5 days. Warm to touch with tingling feeling and pressure. Aug 1st seen here. States he had SCDs on during his stay. Poss DVT. 138/78, 16 RR, 96% RA, 72 HR. Denies cough or sob.

## 2017-06-13 NOTE — ED Provider Notes (Signed)
Emergency Department Provider Note   I have reviewed the triage vital signs and the nursing notes.   HISTORY  Chief Complaint Leg Swelling   HPI [name] [name] is a 83 y.o. male with PMH of chronic a-fib not on Coumadin, chronic LE edema, HLD, HTN, and PAD presents to the emergency department for evaluation of left lower extremity swelling. Patient reports having a laryngeal surgery on August 1. He was kept in the hospital the day after with the SCDs in place. When he was discharged home he noticed the left leg was more swollen than the right. He has [name] history of bilateral lower extremity edema but has never had issues with unilateral swelling. Denies any chest pain or difficulty breathing. No worsening rash or redness appreciated over the left leg. No fevers or chills. No radiation of symptoms. No modifying factors.   Past Medical History:  Diagnosis Date  . ANEMIA, IRON DEFICIENCY 10/13/2008  . ANXIETY 09/04/2008  . Aortic valvular disorder 08/16/2012   echo EF 45-50% LV mildly reduce,Biocor mitral valve,mild to mod. tricuspid regrug.,mild to mod. aortic regrug.  . Aortic valvular stenosis 11/19/2013  . ATRIAL FIBRILLATION, CHRONIC 01/14/2008   on warfarin since 2007  . BARRETTS ESOPHAGUS 03/20/2009  . BASAL CELL CARCINOMA OF SKIN SITE UNSPECIFIED 09/28/2010  . Cardiomyopathy, ischemic 11/19/2013   EF 45-50% with inferior wall hypokinesis by echo 2014  . CATARACTS, BILATERAL, HX OF 01/14/2008  . CEREBROVASCULAR ACCIDENT, HX OF 08/18/2008  . CHF (congestive heart failure) ([place])   . COAGULOPATHY, COUMADIN-INDUCED 01/14/2008  . COLONIC POLYPS, ADENOMATOUS 01/14/2008  . CORONARY ARTERY DISEASE 06/01/2007   Cath 04/03/2008; Cath 11/01/1990  . Dyspnea    on exertion started couple months ago  . Dysrhythmia    HX OF T[place]Y BRADY  . Edema, peripheral 07/30/2009   LEV doppler- no thrombus or thrombophlebitis  . Elevated PSA   . GASTRIC POLYP 05/13/2009  . GERD 06/01/2007  . History of  hiatal hernia   . Hyperglycemia   . HYPERLIPIDEMIA 06/01/2007  . HYPERTENSION 06/01/2007  . HYPERTHYROIDISM 01/06/2011  . Mitral regurgitation 06/13/2008   mitral valve replacement #20 St. Jude Biocor; Maze procedure by Dr. Amador Cunas  at [facility]  . Mitral stenosis    Porcine bioprosthetic tissue valve placed 06/13/2008  . Mitral valve disorder 09/08/2011   echo EF 50-55% LV normal  . Pacemaker   . Peripheral artery disease ([place]) 06/16/2010   LEA doppler-left ABI nml 0.61 calcified vessels;right ABI  0.68  . PERSONAL HX COLONIC POLYPS 01/01/2010  . PLEURAL EFFUSION, LEFT 10/13/2008  . Prostate cancer [facility])    s/p radiation  . PROSTATE CANCER, HX OF 01/14/2008  . Prosthetic valve dysfunction    Mitral stenosis involving bioprosthetic tissue valve placed 06/13/2008  . Restless leg syndrome   . S/P CABG x 1 06/13/2008   SVG to LAD with open vein harvest right thigh, LIMA harvested but not utilized by Dr Amador Cunas  . S/P Maze operation for atrial fibrillation 06/13/2008   Partial left atrial lesion set using cryothermy for bilateral pulmonary vein isolation by Dr Amador Cunas  . S/P mitral valve replacement with bioprosthetic valve 06/13/2008   24mm St Jude Biocor porcine bioprosthetic tissue valve by Dr Amador Cunas  . 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 Tachy-brady syndrome ([place]) 07/13/2008   medtronic Adapta gen. model ADDR01 ,serial # [id number] H by Dr  [name] at [facility]  . UNSPECIFIED VENOUS INSUFFICIENCY 09/18/2008    Patient Active Problem List   Diagnosis Date Noted  . Vocal fold paralysis, left 06/07/2017  . Acute diastolic CHF (congestive heart failure) ([place]) 04/25/2017  . Hypocalcemia 12/29/2016  . Pulmonary vascular congestion   . CAP (community acquired pneumonia) 12/17/2016  . Elevated troponin 12/17/2016  . CHF (congestive heart failure) ([place]) 12/17/2016  . Pressure  ulcer of sacral region, stage 1 09/23/2016  . Acute on chronic diastolic CHF (congestive heart failure) ([place]) 09/21/2016  . Permanent atrial fibrillation ([place]) 09/01/2016  . Chronic diastolic heart failure ([place]) 03/28/2016  . Diaphragmatic hernia 03/28/2016  . 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 Risk for falls 03/28/2016  . Paresthesia of left lower extremity 11/11/2015  . Hoarse 12/23/2014  . GI bleed 05/17/2014  . Rectal bleed 05/17/2014  . Hypotension 05/17/2014  . 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 Dizziness 04/05/2014  . SSS (sick sinus syndrome) ([place]) 03/25/2014  . CAD S/P percutaneous coronary angioplasty -  03/24/2014  . Perivalvular leak of prosthetic heart valve   . Mitral stenosis   . Routine general medical examination at a health care facility 12/31/2013  . Solitary pulmonary nodule 12/08/2013  . Pleural effusion 12/05/2013  . Dyspnea 12/05/2013  . Pacemaker - Medtronic Adapta, dual chamber 11/19/2013  . Restrictive lung disease 03/30/2013  . Encounter for Onda Kattner-term (current) use of other medications 08/17/2012  . BASAL CELL CARCINOMA OF SKIN SITE UNSPECIFIED 09/28/2010  . PERSONAL HX COLONIC POLYPS 01/01/2010  . BARRETTS ESOPHAGUS 03/20/2009  . Hematuria 10/21/2008  . Iron deficiency anemia 10/13/2008  . Anemia 09/18/2008  . UNSPECIFIED VENOUS INSUFFICIENCY 09/18/2008  . ANXIETY 09/04/2008  . Laceration of leg 09/04/2008  . CEREBROVASCULAR ACCIDENT, HX OF 08/18/2008  . S/P CABG x 1 06/13/2008  . S/P mitral valve replacement with bioprosthetic valve 06/13/2008  . S/P Maze operation for atrial fibrillation 06/13/2008  . PROSTATE CANCER, HX OF 01/14/2008  . CATARACTS, BILATERAL, HX OF 01/14/2008  . Dyslipidemia 06/01/2007  . Essential hypertension 06/01/2007  . GERD 06/01/2007    Past Surgical History:  Procedure Laterality Date  . CARDIAC CATHETERIZATION  11/01/1990  . CARDIOVASCULAR STRESS TEST  06/03/2010    Persantine perfusion EF 45% mild to moderate ischemia basal and mid inferolateral region  . CARDIOVASCULAR STRESS TEST  01/01/2008   left ventricle normal,no significant ischemia  . CARDIOVASCULAR STRESS TEST  09/09/2005    possibility of ischemia inferior wall  . COLONOSCOPY  07/14/2009   diverticulosis, internal hemorrhoids  . CORONARY ARTERY BYPASS GRAFT  06/13/2008   graft x1 w/vein graft LAD artery by Dr. [facility]ador [facility] at [facility]  . CORONARY STENT PLACEMENT  03/24/2014   DES to LAD  . CYSTECTOMY     Left leg  . INTRAOPERATIVE TRANSESOPHAGEAL ECHOCARDIOGRAM N/A 04/22/2014   Procedure: INTRAOPERATIVE TRANSTHORACIC ECHOCARDIOGRAM;  Surgeon: [name], MD;  Location: [place];  Service: Open Heart Surgery;  Laterality: N/A;  . LARYNGOPLASTY Left 06/07/2017   Procedure: LARYNGOPLASTY;  Surgeon: Melida Quitter, MD;  Location: [place];  Service: ENT;  Laterality: Left;  LEFT MEDIALIZATION LARYNGOPLASTY  LOCAL WITH IV SEDATION  . LEFT AND RIGHT HEART CATHETERIZATION WITH CORONARY ANGIOGRAM N/A 01/08/2014   Procedure: LEFT AND RIGHT HEART CATHETERIZATION WITH CORONARY ANGIOGRAM;  Surgeon: Sanda Klein, MD;  Location: [place] CATH LAB;  Service: Cardiovascular;  Laterality: N/A;  . MITRAL VALVE REPLACEMENT  06/13/2008   #20 St. Jude Bicor mitral valve ;Maze  procedure at [facility] by Dr. Amador Cunas  . PACEMAKER PLACEMENT  07/13/2008   [place] dual chamber-Medtronic at [facility]   . PERCUTANEOUS CORONARY ROTOBLATOR INTERVENTION (PCI-R) N/A 03/24/2014   Procedure: PERCUTANEOUS CORONARY ROTOBLATOR INTERVENTION (PCI-R);  Surgeon: Blane Ohara, MD;  Location: [facility] CATH LAB;  Service: Cardiovascular;  Laterality: N/A;  . SKIN CANCER EXCISION     removed from forehead and right leg, nose/face  . TEE WITHOUT CARDIOVERSION N/A 01/22/2014   Procedure: TRANSESOPHAGEAL ECHOCARDIOGRAM (TEE);  Surgeon: Sanda Klein, MD;  Location: [facility] ENDOSCOPY;  Service: Cardiovascular;  Laterality: N/A;  . TONSILLECTOMY    . TRANSCATHETER AORTIC VALVE  REPLACEMENT, TRANSFEMORAL N/A 04/22/2014   Procedure: TRANSCATHETER AORTIC VALVE REPLACEMENT, TRANSFEMORAL;  Surgeon: [name], MD;  Location: [place];  Service: Open Heart Surgery;  Laterality: N/A;  TAVR-TF (RIGHT SIDE)  . UPPER GASTROINTESTINAL ENDOSCOPY  07/14/2009   erosive reflux esopagitis, Barrett's esophagus    Current Outpatient Rx  . Order #: [id number] Class: Normal  . Order #: [id number] Class: Historical Med  . Order #: [id number] Class: Historical Med  . Order #: [id number] Class: Normal  . Order #: [id number] Class: Historical Med  . Order #: [id number] Class: Normal  . Order #: [id number] Class: Historical Med    Allergies Patient has no known allergies.  Family History  Problem Relation Age of Onset  . Breast cancer Mother   . Stroke Mother   . Transient ischemic attack Mother     Social History Social History  Substance Use Topics  . Smoking status: Former Smoker    Years: 2.00    Types: Pipe    Quit date: 11/07/1965  . Smokeless tobacco: Never Used     Comment: quit over 40 years ago, never smoked cigarettes  . Alcohol use No    Review of Systems  Constitutional: No fever/chills Eyes: No visual changes. ENT: No sore throat. Cardiovascular: Denies chest pain.  Respiratory: Denies shortness of breath. Gastrointestinal: No abdominal pain.  No nausea, no vomiting.  No diarrhea.  No constipation. Genitourinary: Negative for dysuria. Musculoskeletal: Negative for back pain. Positive LLE swelling.  Skin: Chronic skin changes in LE.  Neurological: Negative for headaches, focal weakness or numbness.  10-point ROS otherwise negative.  ____________________________________________   PHYSICAL EXAM:  VITAL SIGNS: ED Triage Vitals  Enc Vitals Group     BP 06/13/17 0720 (!) 168/117     Pulse Rate 06/13/17 0720 (!) 105     Resp 06/13/17 0720 18     Temp 06/13/17 0720 97.8 F (36.6 C)     Temp Source 06/13/17 0720 Oral     SpO2 06/13/17 0720 93 %     Weight  06/13/17 0719 155 lb (70.3 kg)     Height 06/13/17 0719 5\' 10"  (1.778 m)     Pain Score 06/13/17 0719 2   Constitutional: Alert and oriented. Well appearing and in no acute distress. Eyes: Conjunctivae are normal.  Head: Atraumatic. Nose: No congestion/rhinnorhea. Mouth/Throat: Mucous membranes are moist. Neck: No stridor.   Cardiovascular: A-fib. Grossly normal heart sounds.   Respiratory: Normal respiratory effort.  No retractions. Lungs CTAB. Gastrointestinal: Soft and nontender. No distention.  Musculoskeletal: No lower extremity tenderness. Bilateral LE pitting edema with LLE worse that the right. No gross deformities of extremities. Neurologic:  Normal speech and language. No gross focal neurologic deficits are appreciated.  Skin:  Skin is warm, dry and intact. Chronic venous stasis dermatitis in B/L lower extremities.   ____________________________________________   LABS (all labs ordered are listed, but  only abnormal results are displayed)  Labs Reviewed  BASIC METABOLIC PANEL - Abnormal; Notable for the following:       Result Value   BUN 23 (*)    Calcium 8.5 (*)    All other components within normal limits  BRAIN NATRIURETIC PEPTIDE - Abnormal; Notable for the following:    B Natriuretic Peptide 286.4 (*)    All other components within normal limits  CBC WITH DIFFERENTIAL/PLATELET - Abnormal; Notable for the following:    RBC 3.76 (*)    Hemoglobin 10.8 (*)    HCT 34.7 (*)    All other components within normal limits   ____________________________________________  EKG   EKG Interpretation  Date/Time:  Tuesday June 13 2017 07:59:05 EDT Ventricular Rate:  91 PR Interval:    QRS Duration: 111 QT Interval:  424 QTC Calculation: 550 R Axis:   42 Text Interpretation:  Atrial fibrillation Ventricular premature complex Anteroseptal infarct, old Repol abnrm suggests ischemia, diffuse leads Prolonged QT interval Similar to prior. No STEMI.  Confirmed by Nanda Quinton [phone]) on 06/13/2017 8:02:38 AM       ____________________________________________  RADIOLOGY  LE Ultrasound:   Left lower extremity venous duplex completed. Left lower extremity is negative for acute deep vein thrombosis. There is no evidence of left Baker's cyst.  06/13/2017 8:28 AM  Maudry Mayhew, BS, RVT, RDCS, RDMS     Electronically signed by Maudry Mayhew A at 06/13/2017 8:28 AM     ____________________________________________   PROCEDURES  Procedure(s) performed:   Procedures  None ____________________________________________   INITIAL IMPRESSION / ASSESSMENT AND PLAN / ED COURSE  Pertinent labs & imaging results that were available during my care of the patient were reviewed by me and considered in my medical decision making (see chart for details).  Patient presents to the emergency department for evaluation of left lower extremity edema in the setting of recent pharyngeal surgery and brief hospitalization. A chest pain or shortness of breath. Plan for ultrasound left lower extremity. Patient is tachycardic on arrival. Plan for EKG and labs. No evidence to suggest PE.   Ultrasound of the left lower extremity is negative for DVT. Labs near baseline. Discussed wrapping the leg for decreased swelling and keeping it elevated while at rest. Patient to follow with his primary care physician.  At this time, I do not feel there is any life-threatening condition present. I have reviewed and discussed all results (EKG, imaging, lab, urine as appropriate), exam findings with patient. I have reviewed nursing notes and appropriate previous records.  I feel the patient is safe to be discharged home without further emergent workup. Discussed usual and customary return precautions. Patient and family (if present) verbalize understanding and are comfortable with this plan.  Patient will follow-up with their primary care provider. If they do not have a primary care  provider, information for follow-up has been provided to them. All questions have been answered.  ____________________________________________  FINAL CLINICAL IMPRESSION(S) / ED DIAGNOSES  Final diagnoses:  Left leg swelling     MEDICATIONS GIVEN DURING THIS VISIT:  Medications - No data to display   NEW OUTPATIENT MEDICATIONS STARTED DURING THIS VISIT:  None   Note:  This document was prepared using Dragon voice recognition software and may include unintentional dictation errors.  Nanda Quinton, MD Emergency Medicine    [place], [facility], MD 06/13/17 [phone]

## 2017-06-15 ENCOUNTER — Encounter: Payer: Self-pay | Admitting: Cardiology

## 2017-06-25 ENCOUNTER — Encounter (HOSPITAL_COMMUNITY): Payer: Self-pay | Admitting: Emergency Medicine

## 2017-06-25 ENCOUNTER — Inpatient Hospital Stay (HOSPITAL_COMMUNITY)
Admission: EM | Admit: 2017-06-25 | Discharge: 2017-06-27 | DRG: 291 | Disposition: A | Discharge status: Home Health | Payer: Medicare Other | Attending: Internal Medicine | Admitting: Internal Medicine

## 2017-06-25 ENCOUNTER — Emergency Department (HOSPITAL_COMMUNITY): Payer: Medicare Other

## 2017-06-25 DIAGNOSIS — I5023 Acute on chronic systolic (congestive) heart failure: Secondary | ICD-10-CM | POA: Diagnosis not present

## 2017-06-25 DIAGNOSIS — N39490 Overflow incontinence: Secondary | ICD-10-CM | POA: Diagnosis present

## 2017-06-25 DIAGNOSIS — Z953 Presence of xenogenic heart valve: Secondary | ICD-10-CM

## 2017-06-25 DIAGNOSIS — I2510 Atherosclerotic heart disease of native coronary artery without angina pectoris: Secondary | ICD-10-CM | POA: Diagnosis present

## 2017-06-25 DIAGNOSIS — Z85828 Personal history of other malignant neoplasm of skin: Secondary | ICD-10-CM

## 2017-06-25 DIAGNOSIS — I255 Ischemic cardiomyopathy: Secondary | ICD-10-CM | POA: Diagnosis present

## 2017-06-25 DIAGNOSIS — I110 Hypertensive heart disease with heart failure: Principal | ICD-10-CM | POA: Diagnosis present

## 2017-06-25 DIAGNOSIS — I5033 Acute on chronic diastolic (congestive) heart failure: Secondary | ICD-10-CM | POA: Diagnosis not present

## 2017-06-25 DIAGNOSIS — Z8673 Personal history of transient ischemic attack (TIA), and cerebral infarction without residual deficits: Secondary | ICD-10-CM

## 2017-06-25 DIAGNOSIS — Z803 Family history of malignant neoplasm of breast: Secondary | ICD-10-CM

## 2017-06-25 DIAGNOSIS — Z66 Do not resuscitate: Secondary | ICD-10-CM | POA: Diagnosis present

## 2017-06-25 DIAGNOSIS — Z951 Presence of aortocoronary bypass graft: Secondary | ICD-10-CM

## 2017-06-25 DIAGNOSIS — Z8546 Personal history of malignant neoplasm of prostate: Secondary | ICD-10-CM

## 2017-06-25 DIAGNOSIS — I10 Essential (primary) hypertension: Secondary | ICD-10-CM | POA: Diagnosis not present

## 2017-06-25 DIAGNOSIS — Z823 Family history of stroke: Secondary | ICD-10-CM

## 2017-06-25 DIAGNOSIS — Z87891 Personal history of nicotine dependence: Secondary | ICD-10-CM

## 2017-06-25 DIAGNOSIS — Z7902 Long term (current) use of antithrombotics/antiplatelets: Secondary | ICD-10-CM

## 2017-06-25 DIAGNOSIS — Z950 Presence of cardiac pacemaker: Secondary | ICD-10-CM

## 2017-06-25 DIAGNOSIS — Z923 Personal history of irradiation: Secondary | ICD-10-CM

## 2017-06-25 DIAGNOSIS — I071 Rheumatic tricuspid insufficiency: Secondary | ICD-10-CM | POA: Diagnosis present

## 2017-06-25 DIAGNOSIS — J9601 Acute respiratory failure with hypoxia: Secondary | ICD-10-CM | POA: Diagnosis present

## 2017-06-25 DIAGNOSIS — K219 Gastro-esophageal reflux disease without esophagitis: Secondary | ICD-10-CM | POA: Diagnosis present

## 2017-06-25 DIAGNOSIS — I739 Peripheral vascular disease, unspecified: Secondary | ICD-10-CM | POA: Diagnosis present

## 2017-06-25 DIAGNOSIS — I481 Persistent atrial fibrillation: Secondary | ICD-10-CM | POA: Diagnosis present

## 2017-06-25 DIAGNOSIS — Z9119 Patient's noncompliance with other medical treatment and regimen: Secondary | ICD-10-CM

## 2017-06-25 DIAGNOSIS — I4891 Unspecified atrial fibrillation: Secondary | ICD-10-CM | POA: Diagnosis not present

## 2017-06-25 DIAGNOSIS — Z955 Presence of coronary angioplasty implant and graft: Secondary | ICD-10-CM

## 2017-06-25 DIAGNOSIS — I272 Pulmonary hypertension, unspecified: Secondary | ICD-10-CM | POA: Diagnosis present

## 2017-06-25 LAB — CBC WITH DIFFERENTIAL/PLATELET
Basophils Absolute: 0 10*3/uL (ref 0.0–0.1)
Basophils Relative: 0 %
Eosinophils Absolute: 0 10*3/uL (ref 0.0–0.7)
Eosinophils Relative: 0 %
HCT: 37.7 % — ABNORMAL LOW (ref 39.0–52.0)
Hemoglobin: 11.9 g/dL — ABNORMAL LOW (ref 13.0–17.0)
Lymphocytes Relative: 14 %
Lymphs Abs: 0.8 10*3/uL (ref 0.7–4.0)
MCH: 29.5 pg (ref 26.0–34.0)
MCHC: 31.6 g/dL (ref 30.0–36.0)
MCV: 93.3 fL (ref 78.0–100.0)
Monocytes Absolute: 0.5 10*3/uL (ref 0.1–1.0)
Monocytes Relative: 8 %
Neutro Abs: 4.4 10*3/uL (ref 1.7–7.7)
Neutrophils Relative %: 78 %
Platelets: 168 10*3/uL (ref 150–400)
RBC: 4.04 MIL/uL — ABNORMAL LOW (ref 4.22–5.81)
RDW: 15.2 % (ref 11.5–15.5)
WBC: 5.8 10*3/uL (ref 4.0–10.5)

## 2017-06-25 LAB — BASIC METABOLIC PANEL
Anion gap: 7 (ref 5–15)
BUN: 21 mg/dL — ABNORMAL HIGH (ref 6–20)
CO2: 31 mmol/L (ref 22–32)
Calcium: 8.8 mg/dL — ABNORMAL LOW (ref 8.9–10.3)
Chloride: 107 mmol/L (ref 101–111)
Creatinine, Ser: 0.79 mg/dL (ref 0.61–1.24)
GFR calc Af Amer: >60 mL/min (ref >60)
GFR calc non Af Amer: >60 mL/min (ref >60)
Glucose, Bld: 115 mg/dL — ABNORMAL HIGH (ref 65–99)
Potassium: 4 mmol/L (ref 3.5–5.1)
Sodium: 145 mmol/L (ref 135–145)

## 2017-06-25 LAB — URINALYSIS, ROUTINE W REFLEX MICROSCOPIC
Bilirubin Urine: NEGATIVE
Glucose, UA: NEGATIVE mg/dL
Hgb urine dipstick: NEGATIVE
Ketones, ur: NEGATIVE mg/dL
Leukocytes, UA: NEGATIVE
Nitrite: NEGATIVE
Protein, ur: NEGATIVE mg/dL
Specific Gravity, Urine: 1.009 (ref 1.005–1.030)
pH: 5 (ref 5.0–8.0)

## 2017-06-25 LAB — TROPONIN I: Troponin I: <0.03 ng/mL (ref <0.03)

## 2017-06-25 LAB — BRAIN NATRIURETIC PEPTIDE: B Natriuretic Peptide: 447.4 pg/mL — ABNORMAL HIGH (ref 0.0–100.0)

## 2017-06-25 MED ORDER — ONDANSETRON HCL 4 MG/2ML IJ SOLN: 4.0000 mg | Freq: Four times a day (QID) | INTRAMUSCULAR | Status: DC | PRN

## 2017-06-25 MED ORDER — MORPHINE SULFATE (PF) 4 MG/ML IV SOLN: 4.0000 mg | INTRAVENOUS | Status: DC | PRN

## 2017-06-25 MED ORDER — SIMVASTATIN 40 MG PO TABS
40.0000 mg | ORAL_TABLET | Freq: Every day | ORAL | Status: DC
  Administered 2017-06-25 – 2017-06-26 (×2): 40 mg via ORAL
  Filled 2017-06-25 (×2): qty 1

## 2017-06-25 MED ORDER — CLOPIDOGREL BISULFATE 75 MG PO TABS
75.0000 mg | ORAL_TABLET | Freq: Every day | ORAL | Status: DC
  Administered 2017-06-26 – 2017-06-27 (×2): 75 mg via ORAL
  Filled 2017-06-25 (×2): qty 1

## 2017-06-25 MED ORDER — SODIUM CHLORIDE 0.9% FLUSH
3.0000 mL | Freq: Two times a day (BID) | INTRAVENOUS | Status: DC
  Administered 2017-06-25 – 2017-06-27 (×4): 3 mL via INTRAVENOUS

## 2017-06-25 MED ORDER — PANTOPRAZOLE SODIUM 40 MG PO TBEC
40.0000 mg | DELAYED_RELEASE_TABLET | Freq: Every day | ORAL | Status: DC
  Administered 2017-06-25 – 2017-06-27 (×3): 40 mg via ORAL
  Filled 2017-06-25 (×3): qty 1

## 2017-06-25 MED ORDER — HYDRALAZINE HCL 20 MG/ML IJ SOLN: 10.0000 mg | Freq: Three times a day (TID) | INTRAMUSCULAR | Status: DC | PRN

## 2017-06-25 MED ORDER — TAMSULOSIN HCL 0.4 MG PO CAPS
0.4000 mg | ORAL_CAPSULE | Freq: Every day | ORAL | Status: DC
  Administered 2017-06-25 – 2017-06-26 (×2): 0.4 mg via ORAL
  Filled 2017-06-25 (×2): qty 1

## 2017-06-25 MED ORDER — FUROSEMIDE 10 MG/ML IJ SOLN
40.0000 mg | Freq: Two times a day (BID) | INTRAMUSCULAR | Status: AC
  Administered 2017-06-25 – 2017-06-26 (×2): 40 mg via INTRAVENOUS
  Filled 2017-06-25 (×2): qty 4

## 2017-06-25 MED ORDER — NITROGLYCERIN 0.4 MG SL SUBL: 0.4000 mg | SUBLINGUAL_TABLET | SUBLINGUAL | Status: DC | PRN

## 2017-06-25 MED ORDER — POTASSIUM CHLORIDE CRYS ER 20 MEQ PO TBCR
20.0000 meq | EXTENDED_RELEASE_TABLET | Freq: Every day | ORAL | Status: DC
  Administered 2017-06-26 – 2017-06-27 (×2): 20 meq via ORAL
  Filled 2017-06-25 (×2): qty 1

## 2017-06-25 MED ORDER — ACETAMINOPHEN 325 MG PO TABS: 650.0000 mg | ORAL_TABLET | ORAL | Status: DC | PRN

## 2017-06-25 MED ORDER — SODIUM CHLORIDE 0.9% FLUSH: 3.0000 mL | INTRAVENOUS | Status: DC | PRN

## 2017-06-25 MED ORDER — FUROSEMIDE 10 MG/ML IJ SOLN
40.0000 mg | Freq: Once | INTRAMUSCULAR | Status: AC
  Administered 2017-06-25: 40 mg via INTRAVENOUS
  Filled 2017-06-25: qty 4

## 2017-06-25 MED ORDER — ENOXAPARIN SODIUM 40 MG/0.4ML ~~LOC~~ SOLN
40.0000 mg | SUBCUTANEOUS | Status: DC
  Administered 2017-06-25 – 2017-06-26 (×2): 40 mg via SUBCUTANEOUS
  Filled 2017-06-25 (×2): qty 0.4

## 2017-06-25 MED ORDER — SODIUM CHLORIDE 0.9 % IV SOLN: 250.0000 mL | INTRAVENOUS | Status: DC | PRN

## 2017-06-25 MED ORDER — ADULT MULTIVITAMIN W/MINERALS CH
1.0000 | ORAL_TABLET | Freq: Every day | ORAL | Status: DC
  Administered 2017-06-26 – 2017-06-27 (×2): 1 via ORAL
  Filled 2017-06-25 (×2): qty 1

## 2017-06-25 NOTE — H&P (Signed)
History and Physical    [name] [name] HWE:[id number] DOB: November 13, 1928 DOA: 06/25/2017   PCP: [name], MD   Attending physician: Aggie Moats  Patient coming from/Resides with: Independent living facility/wife  Chief Complaint: Shortness of breath  HPI: [name] [name] is a 83 y.o. male with medical history significant for chronic diastolic heart failure, persistent atrial fibrillation not on anticoagulation, status post pacemaker for tachybradycardia syndrome, history of mitral valve prosthesis, status post TAVR, CAD status post stent 2016, hypertension, hyperthyroidism, dyslipidemia, and recent left medialization laryngoplasty for left vocal fold paralysis. Patient presents to the ER with shortness of breath. Patient admits to nonadherence to his Lasix regimen because of issues with constant and recurrent overflow incontinence. Patient states that he takes his Lasix when he feels short of breath but not daily as prescribed. In the ER chest x-ray revealed mild CHF, BNP mildly elevated at 447, room air resting saturations 94% but because of tachypnea and increased work of breathing nasal cannula oxygen has been placed. He has chronic lower extremity edema and patient denies increase in swelling.  ED Course:  Vital Signs: BP (!) 186/83   Pulse (!) 39   Temp 98.7 F (37.1 C) (Oral)   Resp (!) 23   SpO2 100%  2 view CXR: Mild CHF Lab data: Sodium 145, potassium 4.0, chloride 107, CO2 31, glucose 1:15, BUN 21, creatinine 0.79, anion gap 7, BNP 447, troponin <0.03, white count 5800 with normal differential, hemoglobin 0.9, platelets 168,000, urinalysis unremarkable Medications and treatments: Lasix 40 mg IV 1  Review of Systems:  In addition to the HPI above,  No Fever-chills, myalgias or other constitutional symptoms No Headache, changes with Vision or hearing, new weakness, tingling, numbness in any extremity, dizziness, dysarthria or word finding difficulty, gait disturbance or imbalance,  tremors or seizure activity No problems swallowing food or Liquids, indigestion/reflux, choking or coughing while eating, abdominal pain with or after eating No Chest pain, Cough, palpitations No Abdominal pain, N/V, melena,hematochezia, dark tarry stools, constipation No dysuria, malodorous urine, hematuria or flank pain No new skin rashes, lesions, masses or bruises, No new joint pains, aches, swelling or redness No recent unintentional weight gain or loss No polyuria, polydypsia or polyphagia   Past Medical History:  Diagnosis Date  . ANEMIA, IRON DEFICIENCY 10/13/2008  . ANXIETY 09/04/2008  . Aortic valvular disorder 08/16/2012   echo EF 45-50% LV mildly reduce,Biocor mitral valve,mild to mod. tricuspid regrug.,mild to mod. aortic regrug.  . Aortic valvular stenosis 11/19/2013  . ATRIAL FIBRILLATION, CHRONIC 01/14/2008   on warfarin since 2007  . BARRETTS ESOPHAGUS 03/20/2009  . BASAL CELL CARCINOMA OF SKIN SITE UNSPECIFIED 09/28/2010  . Cardiomyopathy, ischemic 11/19/2013   EF 45-50% with inferior wall hypokinesis by echo 2014  . CATARACTS, BILATERAL, HX OF 01/14/2008  . CEREBROVASCULAR ACCIDENT, HX OF 08/18/2008  . CHF (congestive heart failure) ([place])   . COAGULOPATHY, COUMADIN-INDUCED 01/14/2008  . COLONIC POLYPS, ADENOMATOUS 01/14/2008  . CORONARY ARTERY DISEASE 06/01/2007   Cath 04/03/2008; Cath 11/01/1990  . Dyspnea    on exertion started couple months ago  . Dysrhythmia    HX OF [place]  . Edema, peripheral 07/30/2009   LEV doppler- no thrombus or thrombophlebitis  . Elevated PSA   . GASTRIC POLYP 05/13/2009  . GERD 06/01/2007  . History of hiatal hernia   . Hyperglycemia   . HYPERLIPIDEMIA 06/01/2007  . HYPERTENSION 06/01/2007  . HYPERTHYROIDISM 01/06/2011  . Mitral regurgitation 06/13/2008   mitral valve replacement #20  St. Jude Biocor; Maze procedure by Dr. Amador Cunas  at [facility]  . Mitral stenosis    Porcine bioprosthetic tissue valve placed 06/13/2008  . Mitral valve disorder  09/08/2011   echo EF 50-55% LV normal  . Pacemaker   . Peripheral artery disease ([place]) 06/16/2010   LEA doppler-left ABI nml 0.61 calcified vessels;right ABI  0.68  . PERSONAL HX COLONIC POLYPS 01/01/2010  . PLEURAL EFFUSION, LEFT 10/13/2008  . Prostate cancer [facility])    s/p radiation  . PROSTATE CANCER, HX OF 01/14/2008  . Prosthetic valve dysfunction    Mitral stenosis involving bioprosthetic tissue valve placed 06/13/2008  . Restless leg syndrome   . S/P CABG x 1 06/13/2008   SVG to LAD with open vein harvest right thigh, LIMA harvested but not utilized by Dr Amador Cunas  . S/P Maze operation for atrial fibrillation 06/13/2008   Partial left atrial lesion set using cryothermy for bilateral pulmonary vein isolation by Dr Amador Cunas  . S/P mitral valve replacement with bioprosthetic valve 06/13/2008   73mm St Jude Biocor porcine bioprosthetic tissue valve by Dr Amador Cunas  . 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 Tachy-brady syndrome ([place]) 07/13/2008   medtronic Adapta gen. model ADDR01 ,serial # [id number] H by Dr  [name] at [facility]  . UNSPECIFIED VENOUS INSUFFICIENCY 09/18/2008    Past Surgical History:  Procedure Laterality Date  . CARDIAC CATHETERIZATION  11/01/1990  . CARDIOVASCULAR STRESS TEST  06/03/2010   Persantine perfusion EF 45% mild to moderate ischemia basal and mid inferolateral region  . CARDIOVASCULAR STRESS TEST  01/01/2008   left ventricle normal,no significant ischemia  . CARDIOVASCULAR STRESS TEST  09/09/2005    possibility of ischemia inferior wall  . COLONOSCOPY  07/14/2009   diverticulosis, internal hemorrhoids  . CORONARY ARTERY BYPASS GRAFT  06/13/2008   graft x1 w/vein graft LAD artery by Dr. Amador Cunas at [facility]  . CORONARY STENT PLACEMENT  03/24/2014   DES to LAD  . CYSTECTOMY     Left leg  . INTRAOPERATIVE TRANSESOPHAGEAL ECHOCARDIOGRAM N/A  04/22/2014   Procedure: INTRAOPERATIVE TRANSTHORACIC ECHOCARDIOGRAM;  Surgeon: [name], MD;  Location: [place];  Service: Open Heart Surgery;  Laterality: N/A;  . LARYNGOPLASTY Left 06/07/2017   Procedure: LARYNGOPLASTY;  Surgeon: Melida Quitter, MD;  Location: [place];  Service: ENT;  Laterality: Left;  LEFT MEDIALIZATION LARYNGOPLASTY  LOCAL WITH IV SEDATION  . LEFT AND RIGHT HEART CATHETERIZATION WITH CORONARY ANGIOGRAM N/A 01/08/2014   Procedure: LEFT AND RIGHT HEART CATHETERIZATION WITH CORONARY ANGIOGRAM;  Surgeon: Sanda Klein, MD;  Location: [place] CATH LAB;  Service: Cardiovascular;  Laterality: N/A;  . MITRAL VALVE REPLACEMENT  06/13/2008   #20 St. Jude Bicor mitral valve ;Maze procedure at [facility] by Dr. Amador Cunas  . PACEMAKER PLACEMENT  07/13/2008   [place] dual chamber-Medtronic at [facility]   . PERCUTANEOUS CORONARY ROTOBLATOR INTERVENTION (PCI-R) N/A 03/24/2014   Procedure: PERCUTANEOUS CORONARY ROTOBLATOR INTERVENTION (PCI-R);  Surgeon: Blane Ohara, MD;  Location: [facility] CATH LAB;  Service: Cardiovascular;  Laterality: N/A;  . SKIN CANCER EXCISION     removed from forehead and right leg, nose/face  . TEE WITHOUT CARDIOVERSION N/A 01/22/2014   Procedure: TRANSESOPHAGEAL ECHOCARDIOGRAM (TEE);  Surgeon: Sanda Klein, MD;  Location: [facility] ENDOSCOPY;  Service: Cardiovascular;  Laterality: N/A;  . TONSILLECTOMY    . TRANSCATHETER AORTIC VALVE REPLACEMENT, TRANSFEMORAL N/A 04/22/2014   Procedure: TRANSCATHETER AORTIC VALVE  REPLACEMENT, TRANSFEMORAL;  Surgeon: [name], MD;  Location: [place];  Service: Open Heart Surgery;  Laterality: N/A;  TAVR-TF (RIGHT SIDE)  . UPPER GASTROINTESTINAL ENDOSCOPY  07/14/2009   erosive reflux esopagitis, Barrett's esophagus    Social History   Social History  . Marital status: Married    Spouse name: N/A  . Number of children: 4  . Years of education: N/A   Occupational History  . Retired Retired   Social History Main Topics  . Smoking status: Former  Smoker    Years: 2.00    Types: Pipe    Quit date: 11/07/1965  . Smokeless tobacco: Never Used     Comment: quit over 40 years ago, never smoked cigarettes  . Alcohol use No  . Drug use: No  . Sexual activity: Not on file   Other Topics Concern  . Not on file   Social History Narrative  . No narrative on file    Mobility: Independent Work history: Not obtained   No Known Allergies  Family History  Problem Relation Age of Onset  . Breast cancer Mother   . Stroke Mother   . Transient ischemic attack Mother      Prior to Admission medications   Medication Sig Start Date End Date Taking? Authorizing Provider  clopidogrel (PLAVIX) 75 MG tablet Take 1 tablet (75 mg total) by mouth daily with breakfast. 07/29/16  Yes [name], [name], MD  furosemide (LASIX) 40 MG tablet Take 40-80 mg by mouth daily as needed for fluid or edema.    Yes [provider]  Multiple Vitamin (MULTIVITAMIN WITH MINERALS) TABS Take 1 tablet by mouth daily. Centrum Silver   Yes [provider]  pantoprazole (PROTONIX) 40 MG tablet Take 1 tablet (40 mg total) by mouth daily. 11/09/16  Yes [name], MD  potassium chloride SA (K-DUR,KLOR-CON) 20 MEQ tablet Take 20 mEq by mouth daily.   Yes [provider]  simvastatin (ZOCOR) 40 MG tablet Take 1 tablet (40 mg total) by mouth at bedtime. 02/02/17  Yes [name], MD  Tamsulosin HCl (FLOMAX) 0.4 MG CAPS Take 0.4 mg by mouth at bedtime.    Yes [provider]    Physical Exam: Vitals:   06/25/17 1245 06/25/17 1248 06/25/17 1300 06/25/17 1315  BP: 140/88 140/88 (!) 186/83   Pulse: (!) 117 96 (!) 36 (!) 39  Resp: (!) 27 (!) 22 (!) 24 (!) 23  Temp:  98.7 F (37.1 C)    TempSrc:  Oral    SpO2: 95% 98% 94% 100%      Constitutional: Mild respiratory distress as evidenced by increased work of breathing, calm, slightly uncomfortable secondary to respiratory symptoms Eyes: PERRL, lids and conjunctivae normal ENMT: Mucous  membranes are moist. Posterior pharynx clear of any exudate or lesions. Age appropriate dentition.  Neck: normal, supple, no masses, no thyromegaly Respiratory: Scattered fine expiratory crackles, tachypnea with mild accessory muscle use. Not hypoxic at rest; nasal cannula oxygen in place Cardiovascular: Regular rate and rhythm, no murmurs / rubs / gallops. Chronic appearing bilateral lower extremity edema 1-2+. 2+ pedal pulses. No carotid bruits.  Abdomen: no tenderness, no masses palpated. No hepatosplenomegaly. Bowel sounds positive.  Musculoskeletal: no clubbing / cyanosis. No joint deformity upper and lower extremities. Good ROM, no contractures. Normal muscle tone.  Skin: no rashes, lesions, ulcers. No induration Neurologic: CN 2-12 grossly intact. Sensation intact, DTR normal. Strength 5/5 x all 4 extremities.  Psychiatric: Normal judgment and insight. Alert and  oriented x 3. Normal mood.    Labs on Admission: I have personally reviewed following labs and imaging studies  CBC:  Recent Labs Lab 06/25/17 1317  WBC 5.8  NEUTROABS 4.4  HGB 11.9*  HCT 37.7*  MCV 93.3  PLT 580   Basic Metabolic Panel:  Recent Labs Lab 06/25/17 1317  NA 145  K 4.0  CL 107  CO2 31  GLUCOSE 115*  BUN 21*  CREATININE 0.79  CALCIUM 8.8*   GFR: Estimated Creatinine Clearance: 65.9 mL/min (by C-G formula based on SCr of 0.79 mg/dL). Liver Function Tests: No results for input(s): AST, ALT, ALKPHOS, BILITOT, PROT, ALBUMIN in the last 168 hours. No results for input(s): LIPASE, AMYLASE in the last 168 hours. No results for input(s): AMMONIA in the last 168 hours. Coagulation Profile: No results for input(s): INR, PROTIME in the last 168 hours. Cardiac Enzymes:  Recent Labs Lab 06/25/17 1317  TROPONINI <0.03   BNP (last 3 results)  Recent Labs  12/29/16 0955  PROBNP 185.0*   HbA1C: No results for input(s): HGBA1C in the last 72 hours. CBG: No results for input(s): GLUCAP in the  last 168 hours. Lipid Profile: No results for input(s): CHOL, HDL, LDLCALC, TRIG, CHOLHDL, LDLDIRECT in the last 72 hours. Thyroid Function Tests: No results for input(s): TSH, T4TOTAL, FREET4, T3FREE, THYROIDAB in the last 72 hours. Anemia Panel: No results for input(s): VITAMINB12, FOLATE, FERRITIN, TIBC, IRON, RETICCTPCT in the last 72 hours. Urine analysis:    Component Value Date/Time   COLORURINE YELLOW 06/25/2017 [place] 06/25/2017 1322   LABSPEC 1.009 06/25/2017 1322   PHURINE 5.0 06/25/2017 1322   GLUCOSEU NEGATIVE 06/25/2017 1322   GLUCOSEU NEGATIVE 04/03/2015 1106   HGBUR NEGATIVE 06/25/2017 1322   HGBUR large 10/21/2008 0911   BILIRUBINUR NEGATIVE 06/25/2017 1322   KETONESUR NEGATIVE 06/25/2017 1322   PROTEINUR NEGATIVE 06/25/2017 1322   UROBILINOGEN 1.0 04/03/2015 2021   NITRITE NEGATIVE 06/25/2017 1322   LEUKOCYTESUR NEGATIVE 06/25/2017 1322   Sepsis Labs: @LABRCNTIP (procalcitonin:4,lacticidven:4) )No results found for this or any previous visit (from the past 240 hour(s)).   Radiological Exams on Admission: Dg Chest 2 View  Result Date: 06/25/2017 CLINICAL DATA:  Shortness of breath.  Has not been taking Lasix. EXAM: CHEST  2 VIEW COMPARISON:  04/25/2017 FINDINGS: There is bilateral diffuse mild interstitial thickening. There is a small right pleural effusion. There is no pneumothorax. There is stable cardiomegaly. There is evidence of prior median sternotomy and T AVR. There is stable cardiomegaly. There is a dual lead cardiac pacemaker. There is an old healed left mid clavicular fracture. IMPRESSION: Findings most consistent with mild CHF. Electronically Signed   By: Kathreen Devoid   On: 06/25/2017 13:37    EKG: (Independently reviewed) H her fibrillation with ventricular rate 84 bpm, QTC 469 ms, nonspecific IVCD and septal leads, evidence of LV strain but no evidence of true ischemia  Assessment/Plan Principal Problem:   Acute on chronic  diastolic heart failure /moderate to severe pulmonary hypertension w/ moderate tricuspid regurgitation -Presents with mild CHF exacerbation without resting hypoxemia in setting of nonadherence to medication schedule -Lasix 40 mg IV every 12 hours 2 more doses -Daily weights, strict I/O -Not on ARB prior to admission -Not on beta blocker secondary to history of tachybradycardia syndrome -Last echocardiogram every 2018: Normal LVEF, bioprosthetic aortic and mitral valves, severe left atrial dilatation, moderate TR with pulmonary hypertension 53 mmHg  Active Problems:   Overflow incontinence -History of  prior prostate cancer -Because of recurrent incontinence patient is reluctant to take Lasix regularly -Needs to follow-up with established urologist to determine if treatment options possible other then Flomax currently prescribed -Condom catheter while hospitalized    Atrial fibrillation  -Rate controlled -Pacemaker in place -Not on AVN blocking agents -Not on chronic anticoagulation    Unspecified essential hypertension -Not on chronic medications -Currently hypertensive secondary to volume overload and increased work of breathing    Coronary atherosclerosis of unspecified type of vessel, native or graft -History of stent in 2016 -Continue Plavix and statin -Not on beta blocker prior to admission      DVT prophylaxis: Lovenox  Code Status: DO NOT RESUSCITATE  Family Communication: None  Disposition Plan: Independent living facility Consults called: None     ELLIS,ALLISON L. ANP-BC [facility] Pager [phone]   If 7PM-7AM, please contact night-coverage [email or web address] Password [email or web address]  06/25/2017, 2:52 PM

## 2017-06-25 NOTE — ED Provider Notes (Signed)
[facility] DEPT Provider Note   CSN: [id number] Arrival date & time: 06/25/17  1240     History   Chief Complaint Chief Complaint  Patient presents with  . Shortness of Breath    HPI [name] [name] is a 83 y.o. male.  HPI Patient presents to the emergency department with shortness of breath that started 2 days ago.  The patient states that he stopped taking his Lasix twice a day because he did not like P all the time.  The patient states that nothing seems make the condition better or worse.  The patient states that lying down at night, made the shortness of breath, worse. The patient denies chest pain,  headache,blurred vision, neck pain, fever, cough, weakness, numbness, dizziness, anorexia, abdominal pain, nausea, vomiting, diarrhea, rash, back pain, dysuria, hematemesis, bloody stool, near syncope, or syncope. Past Medical History:  Diagnosis Date  . ANEMIA, IRON DEFICIENCY 10/13/2008  . ANXIETY 09/04/2008  . Aortic valvular disorder 08/16/2012   echo EF 45-50% LV mildly reduce,Biocor mitral valve,mild to mod. tricuspid regrug.,mild to mod. aortic regrug.  . Aortic valvular stenosis 11/19/2013  . ATRIAL FIBRILLATION, CHRONIC 01/14/2008   on warfarin since 2007  . BARRETTS ESOPHAGUS 03/20/2009  . BASAL CELL CARCINOMA OF SKIN SITE UNSPECIFIED 09/28/2010  . Cardiomyopathy, ischemic 11/19/2013   EF 45-50% with inferior wall hypokinesis by echo 2014  . CATARACTS, BILATERAL, HX OF 01/14/2008  . CEREBROVASCULAR ACCIDENT, HX OF 08/18/2008  . CHF (congestive heart failure) ([place])   . COAGULOPATHY, COUMADIN-INDUCED 01/14/2008  . COLONIC POLYPS, ADENOMATOUS 01/14/2008  . CORONARY ARTERY DISEASE 06/01/2007   Cath 04/03/2008; Cath 11/01/1990  . Dyspnea    on exertion started couple months ago  . Dysrhythmia    HX OF TACHY BRADY  . Edema, peripheral 07/30/2009   LEV doppler- no thrombus or thrombophlebitis  . Elevated PSA   . GASTRIC POLYP 05/13/2009  . GERD 06/01/2007  . History of hiatal  hernia   . Hyperglycemia   . HYPERLIPIDEMIA 06/01/2007  . HYPERTENSION 06/01/2007  . HYPERTHYROIDISM 01/06/2011  . Mitral regurgitation 06/13/2008   mitral valve replacement #20 St. Jude Biocor; Maze procedure by Dr. Amador Cunas  at [facility]  . Mitral stenosis    Porcine bioprosthetic tissue valve placed 06/13/2008  . Mitral valve disorder 09/08/2011   echo EF 50-55% LV normal  . Pacemaker   . Peripheral artery disease ([place]) 06/16/2010   LEA doppler-left ABI nml 0.61 calcified vessels;right ABI  0.68  . PERSONAL HX COLONIC POLYPS 01/01/2010  . PLEURAL EFFUSION, LEFT 10/13/2008  . Prostate cancer [facility])    s/p radiation  . PROSTATE CANCER, HX OF 01/14/2008  . Prosthetic valve dysfunction    Mitral stenosis involving bioprosthetic tissue valve placed 06/13/2008  . Restless leg syndrome   . S/P CABG x 1 06/13/2008   SVG to LAD with open vein harvest right thigh, LIMA harvested but not utilized by Dr Amador Cunas  . S/P Maze operation for atrial fibrillation 06/13/2008   Partial left atrial lesion set using cryothermy for bilateral pulmonary vein isolation by Dr Amador Cunas  . S/P mitral valve replacement with bioprosthetic valve 06/13/2008   80mm St Jude Biocor porcine bioprosthetic tissue valve by Dr Amador Cunas  . 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 Tachy-brady syndrome ([place]) 07/13/2008   medtronic Adapta gen. model ADDR01 ,serial # [id number] H by Dr  [name] at  ECU  . UNSPECIFIED VENOUS INSUFFICIENCY 09/18/2008    Patient Active Problem List   Diagnosis Date Noted  . Vocal fold paralysis, left 06/07/2017  . Acute diastolic CHF (congestive heart failure) ([place]) 04/25/2017  . Hypocalcemia 12/29/2016  . Pulmonary vascular congestion   . CAP (community acquired pneumonia) 12/17/2016  . Elevated troponin 12/17/2016  . CHF (congestive heart failure) ([place]) 12/17/2016  . Pressure ulcer of  sacral region, stage 1 09/23/2016  . Acute on chronic diastolic CHF (congestive heart failure) ([place]) 09/21/2016  . Permanent atrial fibrillation ([place]) 09/01/2016  . Chronic diastolic heart failure ([place]) 03/28/2016  . Diaphragmatic hernia 03/28/2016  . 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 Risk for falls 03/28/2016  . Paresthesia of left lower extremity 11/11/2015  . Hoarse 12/23/2014  . GI bleed 05/17/2014  . Rectal bleed 05/17/2014  . Hypotension 05/17/2014  . 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 Dizziness 04/05/2014  . SSS (sick sinus syndrome) ([place]) 03/25/2014  . CAD S/P percutaneous coronary angioplasty -  03/24/2014  . Perivalvular leak of prosthetic heart valve   . Mitral stenosis   . Routine general medical examination at a health care facility 12/31/2013  . Solitary pulmonary nodule 12/08/2013  . Pleural effusion 12/05/2013  . Dyspnea 12/05/2013  . Pacemaker - Medtronic Adapta, dual chamber 11/19/2013  . Restrictive lung disease 03/30/2013  . Encounter for long-term (current) use of other medications 08/17/2012  . BASAL CELL CARCINOMA OF SKIN SITE UNSPECIFIED 09/28/2010  . PERSONAL HX COLONIC POLYPS 01/01/2010  . BARRETTS ESOPHAGUS 03/20/2009  . Hematuria 10/21/2008  . Iron deficiency anemia 10/13/2008  . Anemia 09/18/2008  . UNSPECIFIED VENOUS INSUFFICIENCY 09/18/2008  . ANXIETY 09/04/2008  . Laceration of leg 09/04/2008  . CEREBROVASCULAR ACCIDENT, HX OF 08/18/2008  . S/P CABG x 1 06/13/2008  . S/P mitral valve replacement with bioprosthetic valve 06/13/2008  . S/P Maze operation for atrial fibrillation 06/13/2008  . PROSTATE CANCER, HX OF 01/14/2008  . CATARACTS, BILATERAL, HX OF 01/14/2008  . Dyslipidemia 06/01/2007  . Essential hypertension 06/01/2007  . GERD 06/01/2007    Past Surgical History:  Procedure Laterality Date  . CARDIAC CATHETERIZATION  11/01/1990  . CARDIOVASCULAR STRESS TEST  06/03/2010   Persantine  perfusion EF 45% mild to moderate ischemia basal and mid inferolateral region  . CARDIOVASCULAR STRESS TEST  01/01/2008   left ventricle normal,no significant ischemia  . CARDIOVASCULAR STRESS TEST  09/09/2005    possibility of ischemia inferior wall  . COLONOSCOPY  07/14/2009   diverticulosis, internal hemorrhoids  . CORONARY ARTERY BYPASS GRAFT  06/13/2008   graft x1 w/vein graft LAD artery by Dr. Amador Cunas at [facility]  . CORONARY STENT PLACEMENT  03/24/2014   DES to LAD  . CYSTECTOMY     Left leg  . INTRAOPERATIVE TRANSESOPHAGEAL ECHOCARDIOGRAM N/A 04/22/2014   Procedure: INTRAOPERATIVE TRANSTHORACIC ECHOCARDIOGRAM;  Surgeon: [name], MD;  Location: [place];  Service: Open Heart Surgery;  Laterality: N/A;  . LARYNGOPLASTY Left 06/07/2017   Procedure: LARYNGOPLASTY;  Surgeon: Melida Quitter, MD;  Location: [place];  Service: ENT;  Laterality: Left;  LEFT MEDIALIZATION LARYNGOPLASTY  LOCAL WITH IV SEDATION  . LEFT AND RIGHT HEART CATHETERIZATION WITH CORONARY ANGIOGRAM N/A 01/08/2014   Procedure: LEFT AND RIGHT HEART CATHETERIZATION WITH CORONARY ANGIOGRAM;  Surgeon: Sanda Klein, MD;  Location: [place] CATH LAB;  Service: Cardiovascular;  Laterality: N/A;  . MITRAL VALVE REPLACEMENT  06/13/2008   #20 St. Jude Bicor mitral valve ;Maze procedure at [facility] by  Dr. Amador Cunas  . PACEMAKER PLACEMENT  07/13/2008   [place] dual chamber-Medtronic at [facility]   . PERCUTANEOUS CORONARY ROTOBLATOR INTERVENTION (PCI-R) N/A 03/24/2014   Procedure: PERCUTANEOUS CORONARY ROTOBLATOR INTERVENTION (PCI-R);  Surgeon: Blane Ohara, MD;  Location: [facility] CATH LAB;  Service: Cardiovascular;  Laterality: N/A;  . SKIN CANCER EXCISION     removed from forehead and right leg, nose/face  . TEE WITHOUT CARDIOVERSION N/A 01/22/2014   Procedure: TRANSESOPHAGEAL ECHOCARDIOGRAM (TEE);  Surgeon: Sanda Klein, MD;  Location[facility] ENDOSCOPY;  Service: Cardiovascular;  Laterality: N/A;  . TONSILLECTOMY    . TRANSCATHETER AORTIC VALVE  REPLACEMENT, TRANSFEMORAL N/A 04/22/2014   Procedure: TRANSCATHETER AORTIC VALVE REPLACEMENT, TRANSFEMORAL;  Surgeon: [name], MD;  Location: [place];  Service: Open Heart Surgery;  Laterality: N/A;  TAVR-TF (RIGHT SIDE)  . UPPER GASTROINTESTINAL ENDOSCOPY  07/14/2009   erosive reflux esopagitis, Barrett's esophagus       Home Medications    Prior to Admission medications   Medication Sig Start Date End Date Taking? Authorizing Provider  clopidogrel (PLAVIX) 75 MG tablet Take 1 tablet (75 mg total) by mouth daily with breakfast. 07/29/16  Yes [name], [name], MD  furosemide (LASIX) 40 MG tablet Take 40-80 mg by mouth daily as needed for fluid or edema.    Yes [provider]  Multiple Vitamin (MULTIVITAMIN WITH MINERALS) TABS Take 1 tablet by mouth daily. Centrum Silver   Yes [provider]  pantoprazole (PROTONIX) 40 MG tablet Take 1 tablet (40 mg total) by mouth daily. 11/09/16  Yes [name], MD  potassium chloride SA (K-DUR,KLOR-CON) 20 MEQ tablet Take 20 mEq by mouth daily.   Yes [provider]  simvastatin (ZOCOR) 40 MG tablet Take 1 tablet (40 mg total) by mouth at bedtime. 02/02/17  Yes [name], MD  Tamsulosin HCl (FLOMAX) 0.4 MG CAPS Take 0.4 mg by mouth at bedtime.    Yes [provider]    Family History Family History  Problem Relation Age of Onset  . Breast cancer Mother   . Stroke Mother   . Transient ischemic attack Mother     Social History Social History  Substance Use Topics  . Smoking status: Former Smoker    Years: 2.00    Types: Pipe    Quit date: 11/07/1965  . Smokeless tobacco: Never Used     Comment: quit over 40 years ago, never smoked cigarettes  . Alcohol use No     Allergies   Patient has no known allergies.   Review of Systems Review of Systems All other systems negative except as documented in the HPI. All pertinent positives and negatives as reviewed in the HPI.  Physical Exam Updated Vital  Signs BP (!) 186/83   Pulse (!) 39   Temp 98.7 F (37.1 C) (Oral)   Resp (!) 23   SpO2 100%   Physical Exam  Constitutional: He is oriented to person, place, and time. He appears well-developed and well-nourished. No distress.  HENT:  Head: Normocephalic and atraumatic.  Mouth/Throat: Oropharynx is clear and moist.  Eyes: Pupils are equal, round, and reactive to light.  Neck: Normal range of motion. Neck supple.  Cardiovascular: Normal rate, regular rhythm and normal heart sounds.  Exam reveals no gallop and no friction rub.   No murmur heard. Pulmonary/Chest: Effort normal and breath sounds normal. No respiratory distress. He has no wheezes.  Abdominal: Soft. Bowel sounds are normal. He exhibits no distension. There is no tenderness.  Musculoskeletal: He  exhibits edema.  Neurological: He is alert and oriented to person, place, and time. He exhibits normal muscle tone. Coordination normal.  Skin: Skin is warm and dry. Capillary refill takes less than 2 seconds. No rash noted. No erythema.  Psychiatric: He has a normal mood and affect. His behavior is normal.  Nursing note and vitals reviewed.    ED Treatments / Results  Labs (all labs ordered are listed, but only abnormal results are displayed) Labs Reviewed  BASIC METABOLIC PANEL - Abnormal; Notable for the following:       Result Value   Glucose, Bld 115 (*)    BUN 21 (*)    Calcium 8.8 (*)    All other components within normal limits  CBC WITH DIFFERENTIAL/PLATELET - Abnormal; Notable for the following:    RBC 4.04 (*)    Hemoglobin 11.9 (*)    HCT 37.7 (*)    All other components within normal limits  BRAIN NATRIURETIC PEPTIDE - Abnormal; Notable for the following:    B Natriuretic Peptide 447.4 (*)    All other components within normal limits  URINALYSIS, ROUTINE W REFLEX MICROSCOPIC  TROPONIN I    EKG  EKG Interpretation  Date/Time:  Sunday June 25 2017 13:15:38 EDT Ventricular Rate:  84 PR Interval:      QRS Duration: 107 QT Interval:  461 QTC Calculation: 469 R Axis:   65 Text Interpretation:  Atrial fibrillation Anterior infarct, old Repol abnrm suggests ischemia, diffuse leads Minimal ST elevation, lateral leads Baseline wander in lead(s) V3 No significant change since last tracing Confirmed by Gareth Morgan [phone]) on 06/25/2017 1:26:38 PM       Radiology Dg Chest 2 View  Result Date: 06/25/2017 CLINICAL DATA:  Shortness of breath.  Has not been taking Lasix. EXAM: CHEST  2 VIEW COMPARISON:  04/25/2017 FINDINGS: There is bilateral diffuse mild interstitial thickening. There is a small right pleural effusion. There is no pneumothorax. There is stable cardiomegaly. There is evidence of prior median sternotomy and T AVR. There is stable cardiomegaly. There is a dual lead cardiac pacemaker. There is an old healed left mid clavicular fracture. IMPRESSION: Findings most consistent with mild CHF. Electronically Signed   By: Kathreen Devoid   On: 06/25/2017 13:37    Procedures Procedures (including critical care time)  Medications Ordered in ED Medications  furosemide (LASIX) injection 40 mg (not administered)     Initial Impression / Assessment and Plan / ED Course  I have reviewed the triage vital signs and the nursing notes.  Pertinent labs & imaging results that were available during my care of the patient were reviewed by me and considered in my medical decision making (see chart for details).     Patient will need admission to the hospital for further evaluation and care of his congestive heart failure.  Patient is advised plan and all questions were answered.  Patient was given IV Lasix here in the emergency department as well  Final Clinical Impressions(s) / ED Diagnoses   Final diagnoses:  None    New Prescriptions New Prescriptions   No medications on file     Dalia Heading, Hershal Coria 06/25/17 1454    Gareth Morgan, MD 06/26/17 1221

## 2017-06-25 NOTE — ED Notes (Signed)
Upon rounding on pt, condom catheter had fallen off. Replaced with small condom cath. Pt cleaned with full linen change. Unable to calculate accurate output.

## 2017-06-25 NOTE — ED Notes (Signed)
Attempted report x1. 

## 2017-06-25 NOTE — ED Triage Notes (Signed)
Patient presents today from home with complaints of SOB x2 days. Patient reports he is having a hard time sleeping at night. Patient alert and oriented x4. Patient reports he has not been taking Lasix as prescribe.  Patient as JVD on arrival and swelling in lower extremities.

## 2017-06-26 ENCOUNTER — Observation Stay (HOSPITAL_COMMUNITY): Payer: Medicare Other

## 2017-06-26 DIAGNOSIS — N39490 Overflow incontinence: Secondary | ICD-10-CM

## 2017-06-26 DIAGNOSIS — I272 Pulmonary hypertension, unspecified: Secondary | ICD-10-CM | POA: Diagnosis present

## 2017-06-26 DIAGNOSIS — K219 Gastro-esophageal reflux disease without esophagitis: Secondary | ICD-10-CM | POA: Diagnosis present

## 2017-06-26 DIAGNOSIS — Z8546 Personal history of malignant neoplasm of prostate: Secondary | ICD-10-CM | POA: Diagnosis not present

## 2017-06-26 DIAGNOSIS — J9600 Acute respiratory failure, unspecified whether with hypoxia or hypercapnia: Secondary | ICD-10-CM | POA: Diagnosis not present

## 2017-06-26 DIAGNOSIS — Z951 Presence of aortocoronary bypass graft: Secondary | ICD-10-CM | POA: Diagnosis not present

## 2017-06-26 DIAGNOSIS — Z7902 Long term (current) use of antithrombotics/antiplatelets: Secondary | ICD-10-CM | POA: Diagnosis not present

## 2017-06-26 DIAGNOSIS — I10 Essential (primary) hypertension: Secondary | ICD-10-CM

## 2017-06-26 DIAGNOSIS — I361 Nonrheumatic tricuspid (valve) insufficiency: Secondary | ICD-10-CM

## 2017-06-26 DIAGNOSIS — Z923 Personal history of irradiation: Secondary | ICD-10-CM | POA: Diagnosis not present

## 2017-06-26 DIAGNOSIS — Z955 Presence of coronary angioplasty implant and graft: Secondary | ICD-10-CM | POA: Diagnosis not present

## 2017-06-26 DIAGNOSIS — Z950 Presence of cardiac pacemaker: Secondary | ICD-10-CM | POA: Diagnosis not present

## 2017-06-26 DIAGNOSIS — I071 Rheumatic tricuspid insufficiency: Secondary | ICD-10-CM | POA: Diagnosis present

## 2017-06-26 DIAGNOSIS — Z85828 Personal history of other malignant neoplasm of skin: Secondary | ICD-10-CM | POA: Diagnosis not present

## 2017-06-26 DIAGNOSIS — I481 Persistent atrial fibrillation: Secondary | ICD-10-CM | POA: Diagnosis present

## 2017-06-26 DIAGNOSIS — I255 Ischemic cardiomyopathy: Secondary | ICD-10-CM | POA: Diagnosis present

## 2017-06-26 DIAGNOSIS — I2510 Atherosclerotic heart disease of native coronary artery without angina pectoris: Secondary | ICD-10-CM | POA: Diagnosis present

## 2017-06-26 DIAGNOSIS — Z803 Family history of malignant neoplasm of breast: Secondary | ICD-10-CM | POA: Diagnosis not present

## 2017-06-26 DIAGNOSIS — I739 Peripheral vascular disease, unspecified: Secondary | ICD-10-CM | POA: Diagnosis present

## 2017-06-26 DIAGNOSIS — Z9119 Patient's noncompliance with other medical treatment and regimen: Secondary | ICD-10-CM | POA: Diagnosis not present

## 2017-06-26 DIAGNOSIS — I4891 Unspecified atrial fibrillation: Secondary | ICD-10-CM

## 2017-06-26 DIAGNOSIS — Z8673 Personal history of transient ischemic attack (TIA), and cerebral infarction without residual deficits: Secondary | ICD-10-CM | POA: Diagnosis not present

## 2017-06-26 DIAGNOSIS — Z66 Do not resuscitate: Secondary | ICD-10-CM | POA: Diagnosis present

## 2017-06-26 DIAGNOSIS — Z823 Family history of stroke: Secondary | ICD-10-CM | POA: Diagnosis not present

## 2017-06-26 DIAGNOSIS — I5033 Acute on chronic diastolic (congestive) heart failure: Secondary | ICD-10-CM

## 2017-06-26 DIAGNOSIS — Z953 Presence of xenogenic heart valve: Secondary | ICD-10-CM | POA: Diagnosis not present

## 2017-06-26 DIAGNOSIS — I5023 Acute on chronic systolic (congestive) heart failure: Secondary | ICD-10-CM | POA: Diagnosis present

## 2017-06-26 DIAGNOSIS — J9601 Acute respiratory failure with hypoxia: Secondary | ICD-10-CM | POA: Diagnosis present

## 2017-06-26 DIAGNOSIS — I110 Hypertensive heart disease with heart failure: Secondary | ICD-10-CM | POA: Diagnosis present

## 2017-06-26 LAB — BASIC METABOLIC PANEL
Anion gap: 6 (ref 5–15)
BUN: 21 mg/dL — ABNORMAL HIGH (ref 6–20)
CALCIUM: 8.4 mg/dL — AB (ref 8.9–10.3)
CO2: 36 mmol/L — AB (ref 22–32)
CREATININE: 0.92 mg/dL (ref 0.61–1.24)
Chloride: 104 mmol/L (ref 101–111)
Glucose, Bld: 97 mg/dL (ref 65–99)
Potassium: 3.6 mmol/L (ref 3.5–5.1)
SODIUM: 146 mmol/L — AB (ref 135–145)

## 2017-06-26 LAB — TROPONIN I: TROPONIN I: 0.03 ng/mL — AB (ref <0.03)

## 2017-06-26 LAB — ECHOCARDIOGRAM COMPLETE
Height: 70 in
Weight: 2324.8 oz

## 2017-06-26 LAB — MRSA PCR SCREENING: MRSA BY PCR: NEGATIVE

## 2017-06-26 MED ORDER — FUROSEMIDE 10 MG/ML IJ SOLN
40.0000 mg | Freq: Every day | INTRAMUSCULAR | Status: DC
  Administered 2017-06-26: 40 mg via INTRAVENOUS
  Filled 2017-06-26: qty 4

## 2017-06-26 NOTE — Progress Notes (Signed)
*  PRELIMINARY RESULTS* Echocardiogram 2D Echocardiogram has been performed.  Leavy Cella 06/26/2017, 3:27 PM

## 2017-06-26 NOTE — Progress Notes (Signed)
PROGRESS NOTE    [name] [name]  NAT:[id number] DOB: May 07, 1929 DOA: 06/25/2017 PCP: [name], MD   Outpatient Specialists:     Brief Narrative:  [name] [name] is a 83 y.o. male with a Past Medical History asthma medical history significant for heart failure and atrial fibrillation who presents with shortness breath. Diagnosis pulmonary edema. Due to noncompliance. On my exam patient is calm and comfortable. No signs of respiratory distress. Is on O2.   Assessment & Plan:   Principal Problem:   Acute on chronic diastolic heart failure (HCC) Active Problems:   Atrial fibrillation (HCC)   Overflow incontinence   Unspecified essential hypertension   Coronary atherosclerosis of unspecified type of vessel, native or graft   Acute on chronic diastolic heart failure /moderate to severe pulmonary hypertension w/ moderate tricuspid regurgitation -Presents with CHF exacerbation without resting hypoxemia in setting of nonadherence to medication schedule -Lasix 40 mg IV daily -Daily weights, strict I/O -Not on ARB prior to admission -Not on beta blocker secondary to history of tachybradycardia syndrome -Last echocardiogram 2018: Normal LVEF, bioprosthetic aortic and mitral valves, severe left atrial dilatation, moderate TR with pulmonary hypertension 53 mmHg  Acute respiratory failure -wean O2 to off as patient is not on at home  Mildly elevated troponin -suspect due to volume overload -trend 1 more time     Overflow incontinence -History of prior prostate cancer -Because of recurrent incontinence patient is reluctant to take Lasix regularly -Needs to follow-up with established urologist to determine if treatment options possible other then Flomax currently prescribed -Condom catheter while hospitalized    Atrial fibrillation  -Rate controlled -Pacemaker in place -Not on AVN blocking agents -Not on chronic anticoagulation    Unspecified essential hypertension -control  with lasix    Coronary atherosclerosis of unspecified type of vessel, native or graft -History of stent in 2016 -Continue Plavix and statin -Not on beta blocker prior to admission   DVT prophylaxis:  Lovenox   Code Status: DNR   Family Communication: none  Disposition Plan:  Home in 1-2 days   Consultants:   none     Subjective: Breathing improved Has not been taking lasix consistently at home as it "works too well"  Objective: Vitals:   06/25/17 2100 06/26/17 0100 06/26/17 0500 06/26/17 0953  BP: 122/62 (!) 125/47 132/61 (!) 123/57  Pulse: (!) 54 (!) 56 (!) 57 (!) 58  Resp: 18 18 18    Temp: 97.6 F (36.4 C) 98.7 F (37.1 C) 98.2 F (36.8 C) 97.8 F (36.6 C)  TempSrc: Oral Oral Oral Oral  SpO2: 94% 97% 97% 92%  Weight:   65.9 kg (145 lb 4.8 oz)   Height:        Intake/Output Summary (Last 24 hours) at 06/26/17 1055 Last data filed at 06/26/17 0857  Gross per 24 hour  Intake              483 ml  Output             3300 ml  Net            -2817 ml   Filed Weights   06/25/17 1718 06/26/17 0500  Weight: 66.8 kg (147 lb 3 oz) 65.9 kg (145 lb 4.8 oz)    Examination:  General exam: Appears calm and comfortable - hard of hearing Respiratory system: crackles at base, no wheezing, kyphosis of spine Cardiovascular system: irr Gastrointestinal system: Abdomen is nondistended, soft and nontender. No organomegaly or masses  felt. Normal bowel sounds heard. Central nervous system: Alert and oriented. No focal neurological deficits. Extremities: moves all 4 ext Skin: chronic skin changes in b/l LE, no edema Psychiatry: Judgement and insight appear normal. Mood & affect appropriate.     Data Reviewed: I have personally reviewed following labs and imaging studies  CBC:  Recent Labs Lab 06/25/17 1317  WBC 5.8  NEUTROABS 4.4  HGB 11.9*  HCT 37.7*  MCV 93.3  PLT 638   Basic Metabolic Panel:  Recent Labs Lab 06/25/17 1317 06/26/17 0627  NA 145  146*  K 4.0 3.6  CL 107 104  CO2 31 36*  GLUCOSE 115* 97  BUN 21* 21*  CREATININE 0.79 0.92  CALCIUM 8.8* 8.4*   GFR: Estimated Creatinine Clearance: 53.7 mL/min (by C-G formula based on SCr of 0.92 mg/dL). Liver Function Tests: No results for input(s): AST, ALT, ALKPHOS, BILITOT, PROT, ALBUMIN in the last 168 hours. No results for input(s): LIPASE, AMYLASE in the last 168 hours. No results for input(s): AMMONIA in the last 168 hours. Coagulation Profile: No results for input(s): INR, PROTIME in the last 168 hours. Cardiac Enzymes:  Recent Labs Lab 06/25/17 1317 06/25/17 1845 06/26/17 0004 06/26/17 0627  TROPONINI <0.03 <0.03 <0.03 0.03*   BNP (last 3 results)  Recent Labs  12/29/16 0955  PROBNP 185.0*   HbA1C: No results for input(s): HGBA1C in the last 72 hours. CBG: No results for input(s): GLUCAP in the last 168 hours. Lipid Profile: No results for input(s): CHOL, HDL, LDLCALC, TRIG, CHOLHDL, LDLDIRECT in the last 72 hours. Thyroid Function Tests: No results for input(s): TSH, T4TOTAL, FREET4, T3FREE, THYROIDAB in the last 72 hours. Anemia Panel: No results for input(s): VITAMINB12, FOLATE, FERRITIN, TIBC, IRON, RETICCTPCT in the last 72 hours. Urine analysis:    Component Value Date/Time   COLORURINE YELLOW 06/25/2017 [place] 06/25/2017 1322   LABSPEC 1.009 06/25/2017 1322   PHURINE 5.0 06/25/2017 1322   GLUCOSEU NEGATIVE 06/25/2017 1322   GLUCOSEU NEGATIVE 04/03/2015 1106   HGBUR NEGATIVE 06/25/2017 1322   HGBUR large 10/21/2008 0911   BILIRUBINUR NEGATIVE 06/25/2017 1322   KETONESUR NEGATIVE 06/25/2017 1322   PROTEINUR NEGATIVE 06/25/2017 1322   UROBILINOGEN 1.0 04/03/2015 2021   NITRITE NEGATIVE 06/25/2017 1322   LEUKOCYTESUR NEGATIVE 06/25/2017 1322     ) Recent Results (from the past 240 hour(s))  MRSA PCR Screening     Status: None   Collection Time: 06/26/17  3:49 AM  Result Value Ref Range Status   MRSA by PCR  NEGATIVE NEGATIVE Final    Comment:        The GeneXpert MRSA Assay (FDA approved for NASAL specimens only), is one component of a comprehensive MRSA colonization surveillance program. It is not intended to diagnose MRSA infection nor to guide or monitor treatment for MRSA infections.       Anti-infectives    None       Radiology Studies: Dg Chest 2 View  Result Date: 06/25/2017 CLINICAL DATA:  Shortness of breath.  Has not been taking Lasix. EXAM: CHEST  2 VIEW COMPARISON:  04/25/2017 FINDINGS: There is bilateral diffuse mild interstitial thickening. There is a small right pleural effusion. There is no pneumothorax. There is stable cardiomegaly. There is evidence of prior median sternotomy and T AVR. There is stable cardiomegaly. There is a dual lead cardiac pacemaker. There is an old healed left mid clavicular fracture. IMPRESSION: Findings most consistent with mild CHF. Electronically Signed  By: Kathreen Devoid   On: 06/25/2017 13:37        Scheduled Meds: . clopidogrel  75 mg Oral Q breakfast  . enoxaparin (LOVENOX) injection  40 mg Subcutaneous Q24H  . furosemide  40 mg Intravenous Daily  . multivitamin with minerals  1 tablet Oral Daily  . pantoprazole  40 mg Oral Daily  . potassium chloride SA  20 mEq Oral Daily  . simvastatin  40 mg Oral QHS  . sodium chloride flush  3 mL Intravenous Q12H  . tamsulosin  0.4 mg Oral QHS   Continuous Infusions: . sodium chloride       LOS: 0 days    Time spent: 35 min    [place], DO [facility] Pager [phone]  If 7PM-7AM, please contact night-coverage [email or web address] Password [email or web address] 06/26/2017, 10:55 AM

## 2017-06-26 NOTE — Progress Notes (Signed)
Patient tachypneic at times, oxygen sats WNL. No complaint or concerns.

## 2017-06-26 NOTE — Progress Notes (Signed)
SATURATION QUALIFICATIONS: (This note is used to comply with regulatory documentation for home oxygen)  Patient Saturations on Room Air at Rest = 92%  Patient Saturations on Room Air while Ambulating = 88%-91  Patient Saturations on 0 Liters of oxygen while Ambulating 91%  Please briefly explain why patient needs home oxygen: 02 sat back to 92% while ambulating on room air so no 0s added

## 2017-06-26 NOTE — Progress Notes (Signed)
02 removed from [name] [name] and 03 sat = 92%.  Patient instructed to call if he feels short of breath at anytime.

## 2017-06-26 NOTE — Progress Notes (Signed)
Troponin 0.03, MD notified 

## 2017-06-27 ENCOUNTER — Telehealth: Payer: Self-pay | Admitting: Endocrinology

## 2017-06-27 ENCOUNTER — Other Ambulatory Visit: Payer: Self-pay

## 2017-06-27 DIAGNOSIS — J9600 Acute respiratory failure, unspecified whether with hypoxia or hypercapnia: Secondary | ICD-10-CM

## 2017-06-27 LAB — BASIC METABOLIC PANEL
Anion gap: 6 (ref 5–15)
BUN: 23 mg/dL — ABNORMAL HIGH (ref 6–20)
CALCIUM: 8.4 mg/dL — AB (ref 8.9–10.3)
CO2: 35 mmol/L — ABNORMAL HIGH (ref 22–32)
CREATININE: 0.92 mg/dL (ref 0.61–1.24)
Chloride: 103 mmol/L (ref 101–111)
GFR calc non Af Amer: >60 mL/min (ref >60)
Glucose, Bld: 98 mg/dL (ref 65–99)
Potassium: 3.5 mmol/L (ref 3.5–5.1)
SODIUM: 144 mmol/L (ref 135–145)

## 2017-06-27 LAB — CBC
HCT: 36.9 % — ABNORMAL LOW (ref 39.0–52.0)
Hemoglobin: 11.5 g/dL — ABNORMAL LOW (ref 13.0–17.0)
MCH: 28.9 pg (ref 26.0–34.0)
MCHC: 31.2 g/dL (ref 30.0–36.0)
MCV: 92.7 fL (ref 78.0–100.0)
PLATELETS: 171 10*3/uL (ref 150–400)
RBC: 3.98 MIL/uL — AB (ref 4.22–5.81)
RDW: 15 % (ref 11.5–15.5)
WBC: 5.1 10*3/uL (ref 4.0–10.5)

## 2017-06-27 MED ORDER — FUROSEMIDE 40 MG PO TABS: ORAL_TABLET | ORAL | Status: DC

## 2017-06-27 MED ORDER — FUROSEMIDE 40 MG PO TABS
40.0000 mg | ORAL_TABLET | Freq: Two times a day (BID) | ORAL | Status: DC
  Filled 2017-06-27: qty 1

## 2017-06-27 MED ORDER — SIMVASTATIN 40 MG PO TABS: 40.0000 mg | ORAL_TABLET | Freq: Every day | ORAL | 2 refills | Status: DC

## 2017-06-27 MED ORDER — FUROSEMIDE 40 MG PO TABS: 40.0000 mg | ORAL_TABLET | Freq: Two times a day (BID) | ORAL | Status: DC

## 2017-06-27 NOTE — Telephone Encounter (Signed)
MEDICATION: simvastatin (ZOCOR) tablet 40 mg   PHARMACY:    [facility], [place] [phone] (Phone) [phone] (Fax)     IS THIS A 90 DAY SUPPLY : Y  IS PATIENT OUT OF MEDICTAION: N/A  IF NOT; HOW MUCH IS LEFT: N/A  LAST APPOINTMENT DATE: 06/13/17  NEXT APPOINTMENT DATE:  08/30/17  OTHER COMMENTS:    **Let patient know to contact pharmacy at the end of the day to make sure medication is ready. **  ** Please notify patient to allow 48-72 hours to process**  **Encourage patient to contact the pharmacy for refills or they can request refills through [facility]**

## 2017-06-27 NOTE — Telephone Encounter (Signed)
Please schedule. Thank you

## 2017-06-27 NOTE — Telephone Encounter (Signed)
Patient is being discharged from the hospital today and needs a hospital follow up within the next week. Call patient as soon as possible to schedule per [facility].

## 2017-06-27 NOTE — Progress Notes (Signed)
SATURATION QUALIFICATIONS: (This note is used to comply with regulatory documentation for home oxygen)  Patient Saturations on Room Air at Rest = 87%  Patient Saturations on Room Air while Ambulating = 81%  Patient Saturations on 2 Liters of oxygen while Ambulating = 87%  Please briefly explain why patient needs home oxygen: Patient hypoxic amd SOB on RA with resting and ambulation activities.   [place], [place] 06/27/2017

## 2017-06-27 NOTE — Telephone Encounter (Signed)
LM for pt to call back to schedule °
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
## 2017-06-27 NOTE — Discharge Summary (Signed)
Physician Discharge Summary  [name] [name] IHW:[id number] DOB: [birth year] DOA: 06/25/2017  PCP: [name], MD  Admit date: 06/25/2017 Discharge date: 06/27/2017   Recommendations for Outpatient Follow-Up:   1. Repeat chest x ray after 1 week of taking lasix-- may need thoracentesis if not improved 2. Patient to weight daily 3. BMP 1 week 4. Referral for urology follow up   Discharge Diagnosis:   Principal Problem:   Acute on chronic diastolic heart failure (HCC) Active Problems:   Atrial fibrillation (HCC)   Overflow incontinence   Unspecified essential hypertension   Coronary atherosclerosis of unspecified type of vessel, native or graft   Discharge disposition:  Home  Discharge Condition: Improved.  Diet recommendation: Low sodium, heart healthy  Wound care: None.   History of Present Illness:   [name] [name] is a 83 y.o. male with a Past Medical History asthma medical history significant for heart failure and atrial fibrillation who presents with shortness breath. Diagnosis pulmonary edema. Due to noncompliance  Hospital Course by Problem:   Acute on chronic diastolic heart failure /moderate to severe pulmonary hypertension w/moderate tricuspid regurgitation -Presents with CHF exacerbation without resting hypoxemia in setting of nonadherence to medication schedule -has diuresed 3L -Not on ARB prior to admission -Not on beta blocker secondary to history of tachybradycardia syndrome -echo: Normal LV systolic function; moderate LVH; s/p AVR with trace AI   and normal mean gradient of 5 mmHg; s/p MVR with normal mean   gradient of 4 mmHg and no MR; moderate LAE; mild TR.  Acute respiratory failure -will send home on O2 as I suspect the extra fluid, diaphragmatic hernia and pHTN all play a role in patient's SOB  Overflow incontinence -History of prior prostate cancer -Because of recurrent incontinence patient is reluctant to take Lasix regularly -Needs  to follow-up with established urologist to determine if treatment options possible other then Flomax currently prescribed -has urinals at home  Atrial fibrillation  -Rate controlled -Pacemaker in place -Not on AVN blocking agents -Not on chronic anticoagulation  Unspecified essential hypertension -control with lasix  Coronary atherosclerosis of unspecified type of vessel, native or graft -History of stent in 2016 -Continue Plavix and statin -Not on beta blocker prior to admission    Medical Consultants:    None.   Discharge Exam:   Vitals:   06/27/17 0626 06/27/17 1200  BP: (!) 108/55 (!) 121/57  Pulse: 76 63  Resp: 18 20  Temp: 97.9 F (36.6 C) 98.2 F (36.8 C)  SpO2: 91% 94%   Vitals:   06/26/17 1631 06/26/17 2119 06/27/17 0626 06/27/17 1200  BP: (!) 141/59 139/62 (!) 108/55 (!) 121/57  Pulse: 82 73 76 63  Resp:  20 18 20   Temp:  98 F (36.7 C) 97.9 F (36.6 C) 98.2 F (36.8 C)  TempSrc:  Oral Oral Oral  SpO2: 93% 92% 91% 94%  Weight:   64.1 kg (141 lb 4.8 oz)   Height:        Gen:  NAD- patient has improved and anxious to get home to his wife   The results of significant diagnostics from this hospitalization (including imaging, microbiology, ancillary and laboratory) are listed below for reference.     Procedures and Diagnostic Studies:   Dg Chest 2 View  Result Date: 06/25/2017 CLINICAL DATA:  Shortness of breath.  Has not been taking Lasix. EXAM: CHEST  2 VIEW COMPARISON:  04/25/2017 FINDINGS: There is bilateral diffuse mild interstitial thickening. There is a  small right pleural effusion. There is no pneumothorax. There is stable cardiomegaly. There is evidence of prior median sternotomy and T AVR. There is stable cardiomegaly. There is a dual lead cardiac pacemaker. There is an old healed left mid clavicular fracture. IMPRESSION: Findings most consistent with mild CHF. Electronically Signed   By: Kathreen Devoid   On: 06/25/2017 13:37      Labs:   Basic Metabolic Panel:  Recent Labs Lab 06/25/17 1317 06/26/17 0627 06/27/17 0451  NA 145 146* 144  K 4.0 3.6 3.5  CL 107 104 103  CO2 31 36* 35*  GLUCOSE 115* 97 98  BUN 21* 21* 23*  CREATININE 0.79 0.92 0.92  CALCIUM 8.8* 8.4* 8.4*   GFR Estimated Creatinine Clearance: 52.3 mL/min (by C-G formula based on SCr of 0.92 mg/dL). Liver Function Tests: No results for input(s): AST, ALT, ALKPHOS, BILITOT, PROT, ALBUMIN in the last 168 hours. No results for input(s): LIPASE, AMYLASE in the last 168 hours. No results for input(s): AMMONIA in the last 168 hours. Coagulation profile No results for input(s): INR, PROTIME in the last 168 hours.  CBC:  Recent Labs Lab 06/25/17 1317 06/27/17 0451  WBC 5.8 5.1  NEUTROABS 4.4  --   HGB 11.9* 11.5*  HCT 37.7* 36.9*  MCV 93.3 92.7  PLT 168 171   Cardiac Enzymes:  Recent Labs Lab 06/25/17 1317 06/25/17 1845 06/26/17 0004 06/26/17 0627 06/26/17 1008  TROPONINI <0.03 <0.03 <0.03 0.03* <0.03   BNP: Invalid input(s): POCBNP CBG: No results for input(s): GLUCAP in the last 168 hours. D-Dimer No results for input(s): DDIMER in the last 72 hours. Hgb A1c No results for input(s): HGBA1C in the last 72 hours. Lipid Profile No results for input(s): CHOL, HDL, LDLCALC, TRIG, CHOLHDL, LDLDIRECT in the last 72 hours. Thyroid function studies No results for input(s): TSH, T4TOTAL, T3FREE, THYROIDAB in the last 72 hours.  Invalid input(s): FREET3 Anemia work up No results for input(s): VITAMINB12, FOLATE, FERRITIN, TIBC, IRON, RETICCTPCT in the last 72 hours. Microbiology Recent Results (from the past 240 hour(s))  MRSA PCR Screening     Status: None   Collection Time: 06/26/17  3:49 AM  Result Value Ref Range Status   MRSA by PCR NEGATIVE NEGATIVE Final    Comment:        The GeneXpert MRSA Assay (FDA approved for NASAL specimens only), is one component of a comprehensive MRSA colonization surveillance  program. It is not intended to diagnose MRSA infection nor to guide or monitor treatment for MRSA infections.      Discharge Instructions:   Discharge Instructions    (HEART FAILURE PATIENTS) Call MD:  Anytime you have any of the following symptoms: 1) 3 pound weight gain in 24 hours or 5 pounds in 1 week 2) shortness of breath, with or without a dry hacking cough 3) swelling in the hands, feet or stomach 4) if you have to sleep on extra pillows at night in order to breathe.    Complete by:  As directed    Diet - low sodium heart healthy    Complete by:  As directed    Discharge instructions    Complete by:  As directed    X ray and labs with PCP in 1 week Home O2   Increase activity slowly    Complete by:  As directed      Allergies as of 06/27/2017   No Known Allergies     Medication List  TAKE these medications   clopidogrel 75 MG tablet Commonly known as:  PLAVIX Take 1 tablet (75 mg total) by mouth daily with breakfast.   furosemide 40 MG tablet Commonly known as:  LASIX Pt takes one tablet on Monday, Wednesday, and Friday.  Pt takes two tablets on Sunday, Tuesday, Thursday, and Saturday. What changed:  how much to take  how to take this  when to take this  reasons to take this  additional instructions   multivitamin with minerals Tabs tablet Take 1 tablet by mouth daily. Centrum Silver   pantoprazole 40 MG tablet Commonly known as:  PROTONIX Take 1 tablet (40 mg total) by mouth daily.   potassium chloride SA 20 MEQ tablet Commonly known as:  K-DUR,KLOR-CON Take 20 mEq by mouth daily.   simvastatin 40 MG tablet Commonly known as:  ZOCOR Take 1 tablet (40 mg total) by mouth at bedtime.   tamsulosin 0.4 MG Caps capsule Commonly known as:  FLOMAX Take 0.4 mg by mouth at bedtime.            Durable Medical Equipment        Start     Ordered   06/27/17 1141  For home use only DME oxygen  Once    Question Answer Comment  Mode or (Route)  Nasal cannula   Liters per Minute 2   Frequency Continuous (stationary and portable oxygen unit needed)   Oxygen conserving device Yes   Oxygen delivery system Gas      08 /21/18 1141        Time coordinating discharge: 35 min  Signed:  Chastity Noland U Donnel Venuto   [facility] 06/27/2017, 12:25 PM

## 2017-06-27 NOTE — Progress Notes (Signed)
Pt has orders to be discharged. Discharge instructions given and pt has no additional questions at this time. Medication regimen reviewed and pt educated. Pt verbalized understanding and has no additional questions. Telemetry box removed. IV removed and site in good condition. Pt stable and waiting for transportation.  Jadence Kinlaw RN 

## 2017-06-27 NOTE — Care Management Note (Signed)
Case Management Note  Patient Details  Name: [name] [name] MRN: [id number] Date of Birth: July 03, 1929  Subjective/Objective:     CHF              Action/Plan: Patient lives at an [place] with his spouse Beadle at [place]; PCP: [name], MD; has private insurance with [place] with prescription drug coverage; states he goes to their physical therapy for outpatient rehab; Candace at [facility] called, voice mail left; home oxygen ordered as requested and to be delivered to the room today prior to discharging home; patient stated that his wife will provide transportation at discharge.  Expected Discharge Date:   possibly 06/27/2017               Expected Discharge Plan:  [place]  Discharge planning Services  CM Consult  Choice offered to:  Patient  HH Arranged:  PT [facility] Agency:   At [facility]  Status of Service:  In process, will continue to follow  Sherrilyn Rist [phone] 06/27/2017, 11:43 AM

## 2017-06-30 LAB — CUP PACEART REMOTE DEVICE CHECK
Battery Remaining Longevity: 25 mo
Battery Voltage: 2.72 V
Date Time Interrogation Session: 20180724115716
Implantable Lead Implant Date: 20090906
Implantable Lead Location: 753860
Lead Channel Impedance Value: 639 Ohm
Lead Channel Pacing Threshold Amplitude: 1 V
Lead Channel Setting Pacing Amplitude: 2.5 V
Lead Channel Setting Pacing Pulse Width: 0.4 ms
Lead Channel Setting Sensing Sensitivity: 4 mV
MDC IDC LEAD IMPLANT DT: 20090906
MDC IDC LEAD LOCATION: 753859
MDC IDC MSMT BATTERY IMPEDANCE: 3183 Ohm
MDC IDC MSMT LEADCHNL RA IMPEDANCE VALUE: 67 Ohm
MDC IDC MSMT LEADCHNL RV PACING THRESHOLD PULSEWIDTH: 0.4 ms
MDC IDC PG IMPLANT DT: 20090906
MDC IDC STAT BRADY RV PERCENT PACED: 3 %

## 2017-07-03 NOTE — Telephone Encounter (Signed)
LM for pt to call back and schedule.

## 2017-07-14 ENCOUNTER — Telehealth: Payer: Self-pay | Admitting: Endocrinology

## 2017-07-14 NOTE — Telephone Encounter (Signed)
Called patient & nurse visit made for flu shot 9/12.

## 2017-07-14 NOTE — Telephone Encounter (Signed)
Routing to you °

## 2017-07-14 NOTE — Telephone Encounter (Signed)
Patient called to ask if it is time for his flu shot. Call patient to advise.

## 2017-07-19 ENCOUNTER — Telehealth: Payer: Self-pay | Admitting: Endocrinology

## 2017-07-19 ENCOUNTER — Ambulatory Visit: Payer: Medicare Other

## 2017-07-19 MED ORDER — SIMVASTATIN 40 MG PO TABS: 40.0000 mg | ORAL_TABLET | Freq: Every day | ORAL | 1 refills | Status: AC

## 2017-07-19 NOTE — Telephone Encounter (Signed)
Left message on voicemail to call office. Rx was sent to pharmacy as requested.

## 2017-07-19 NOTE — Telephone Encounter (Signed)
MEDICATION: simvastatin (ZOCOR) 40 MG tablet  PHARMACY:   [place], [place] [phone] (Phone) [phone] (Fax)   IS THIS A 90 DAY SUPPLY : yes  IS PATIENT OUT OF MEDICATION: yes

## 2017-07-26 ENCOUNTER — Ambulatory Visit: Payer: Medicare Other | Admitting: Endocrinology

## 2017-07-26 ENCOUNTER — Encounter: Payer: Self-pay | Admitting: Endocrinology

## 2017-07-26 DIAGNOSIS — Z23 Encounter for immunization: Secondary | ICD-10-CM

## 2017-08-11 ENCOUNTER — Encounter: Payer: Self-pay | Admitting: Podiatry

## 2017-08-11 ENCOUNTER — Ambulatory Visit (INDEPENDENT_AMBULATORY_CARE_PROVIDER_SITE_OTHER): Payer: Medicare Other | Admitting: Podiatry

## 2017-08-11 DIAGNOSIS — Q828 Other specified congenital malformations of skin: Secondary | ICD-10-CM | POA: Diagnosis not present

## 2017-08-11 DIAGNOSIS — B351 Tinea unguium: Secondary | ICD-10-CM | POA: Diagnosis not present

## 2017-08-11 DIAGNOSIS — D689 Coagulation defect, unspecified: Secondary | ICD-10-CM

## 2017-08-11 DIAGNOSIS — M79676 Pain in unspecified toe(s): Secondary | ICD-10-CM

## 2017-08-11 NOTE — Progress Notes (Addendum)
Complaint:  Visit Type: Patient returns to my office for continued preventative foot care services. Complaint: Patient states" my nails have grown long and thick and become painful to walk and wear shoes" Patient has been diagnosed with DM with no foot complications. The patient presents for preventative foot care services. No changes to ROS.  Patient is taking plavix.  Patient says he applied a pad to his painful callus right foot.  Podiatric Exam: Vascular: dorsalis pedis and posterior tibial pulses are palpable bilateral. Capillary return is immediate. Temperature gradient is WNL. Skin turgor WNL  Sensorium: Normal Semmes Weinstein monofilament test. Normal tactile sensation bilaterally. Nail Exam: Pt has thick disfigured discolored nails with subungual debris noted bilateral entire nail hallux through fifth toenails.  Dried abscess left hallux. Ulcer Exam: There is no evidence of ulcer or pre-ulcerative changes or infection. Orthopedic Exam: Muscle tone and strength are WNL. No limitations in general ROM. No crepitus or effusions noted. Foot type and digits show no abnormalities. Bony prominences are unremarkable. Skin:  Porokeratosis right heel No infection or ulcers  Diagnosis:  Onychomycosis, , Pain in right toe, pain in left toe porokeratosis    Treatment & Plan Procedures and Treatment: Consent by patient was obtained for treatment procedures. The patient understood the discussion of treatment and procedures well. All questions were answered thoroughly reviewed. Debridement of mycotic and hypertrophic toenails, 1 through 5 bilateral and clearing of subungual debris. No ulceration, no infection noted. Debride porokeratosis.  Padding applied to heel of his shoe. Return Visit-Office Procedure: Patient instructed to return to the office for a follow up visit 10 weeks  for continued evaluation and treatment.    Gardiner Barefoot DPM

## 2017-08-22 ENCOUNTER — Encounter: Payer: Self-pay | Admitting: Endocrinology

## 2017-08-22 ENCOUNTER — Ambulatory Visit (INDEPENDENT_AMBULATORY_CARE_PROVIDER_SITE_OTHER): Payer: Medicare Other | Admitting: Endocrinology

## 2017-08-22 VITALS — BP 128/72 | HR 102 | Wt 162.4 lb

## 2017-08-22 DIAGNOSIS — I2510 Atherosclerotic heart disease of native coronary artery without angina pectoris: Secondary | ICD-10-CM | POA: Diagnosis not present

## 2017-08-22 DIAGNOSIS — Z0000 Encounter for general adult medical examination without abnormal findings: Secondary | ICD-10-CM | POA: Diagnosis not present

## 2017-08-22 DIAGNOSIS — M8588 Other specified disorders of bone density and structure, other site: Secondary | ICD-10-CM | POA: Diagnosis not present

## 2017-08-22 DIAGNOSIS — E0580 Other thyrotoxicosis without thyrotoxic crisis or storm: Secondary | ICD-10-CM

## 2017-08-22 DIAGNOSIS — M8580 Other specified disorders of bone density and structure, unspecified site: Secondary | ICD-10-CM | POA: Insufficient documentation

## 2017-08-22 LAB — LIPID PANEL
CHOLESTEROL: 119 mg/dL (ref 0–200)
HDL: 48.9 mg/dL (ref >39.00)
LDL Cholesterol: 62 mg/dL (ref 0–99)
NonHDL: 69.86
TRIGLYCERIDES: 39 mg/dL (ref 0.0–149.0)
Total CHOL/HDL Ratio: 2
VLDL: 7.8 mg/dL (ref 0.0–40.0)

## 2017-08-22 LAB — VITAMIN D 25 HYDROXY (VIT D DEFICIENCY, FRACTURES): VITD: 38.54 ng/mL (ref 30.00–100.00)

## 2017-08-22 LAB — TSH: TSH: 1.62 u[IU]/mL (ref 0.35–4.50)

## 2017-08-22 MED ORDER — TRIAMCINOLONE ACETONIDE 0.025 % EX CREA: 1.0000 "application " | TOPICAL_CREAM | Freq: Three times a day (TID) | CUTANEOUS | 3 refills | Status: AC | PRN

## 2017-08-22 NOTE — Progress Notes (Signed)
Subjective:    Patient ID: [name] [name], male    DOB: 31-Mar-1929, 83 y.o.   MRN: [id number]  HPI Pt is here for regular wellness examination, and is feeling pretty well in general, and says chronic med probs are stable, except as noted below Past Medical History:  Diagnosis Date  . ANEMIA, IRON DEFICIENCY 10/13/2008  . ANXIETY 09/04/2008  . Aortic valvular disorder 08/16/2012   echo EF 45-50% LV mildly reduce,Biocor mitral valve,mild to mod. tricuspid regrug.,mild to mod. aortic regrug.  . Aortic valvular stenosis 11/19/2013  . ATRIAL FIBRILLATION, CHRONIC 01/14/2008   on warfarin since 2007  . BARRETTS ESOPHAGUS 03/20/2009  . BASAL CELL CARCINOMA OF SKIN SITE UNSPECIFIED 09/28/2010  . Cardiomyopathy, ischemic 11/19/2013   EF 45-50% with inferior wall hypokinesis by echo 2014  . CATARACTS, BILATERAL, HX OF 01/14/2008  . CEREBROVASCULAR ACCIDENT, HX OF 08/18/2008  . CHF (congestive heart failure) ([place])   . COAGULOPATHY, COUMADIN-INDUCED 01/14/2008  . COLONIC POLYPS, ADENOMATOUS 01/14/2008  . CORONARY ARTERY DISEASE 06/01/2007   Cath 04/03/2008; Cath 11/01/1990  . Dyspnea    on exertion started couple months ago  . Dysrhythmia    HX OF TACHY BRADY  . Edema, peripheral 07/30/2009   LEV doppler- no thrombus or thrombophlebitis  . Elevated PSA   . GASTRIC POLYP 05/13/2009  . GERD 06/01/2007  . History of hiatal hernia   . Hyperglycemia   . HYPERLIPIDEMIA 06/01/2007  . HYPERTENSION 06/01/2007  . HYPERTHYROIDISM 01/06/2011  . Mitral regurgitation 06/13/2008   mitral valve replacement #20 St. Jude Biocor; Maze procedure by Dr. Amador Cunas  at [facility]  . Mitral stenosis    Porcine bioprosthetic tissue valve placed 06/13/2008  . Mitral valve disorder 09/08/2011   echo EF 50-55% LV normal  . Pacemaker   . Peripheral artery disease ([place]) 06/16/2010   LEA doppler-left ABI nml 0.61 calcified vessels;right ABI  0.68  . PERSONAL HX COLONIC POLYPS 01/01/2010  . PLEURAL EFFUSION, LEFT 10/13/2008  . Prostate  cancer [facility])    s/p radiation  . PROSTATE CANCER, HX OF 01/14/2008  . Prosthetic valve dysfunction    Mitral stenosis involving bioprosthetic tissue valve placed 06/13/2008  . Restless leg syndrome   . S/P CABG x 1 06/13/2008   SVG to LAD with open vein harvest right thigh, LIMA harvested but not utilized by Dr Amador Cunas  . S/P Maze operation for atrial fibrillation 06/13/2008   Partial left atrial lesion set using cryothermy for bilateral pulmonary vein isolation by Dr Amador Cunas  . S/P mitral valve replacement with bioprosthetic valve 06/13/2008   40mm St Jude Biocor porcine bioprosthetic tissue valve by Dr Amador Cunas  . 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 Tachy-brady syndrome ([place]) 07/13/2008   medtronic Adapta gen. model ADDR01 ,serial # [id number] H by Dr  [name] at [facility]  . UNSPECIFIED VENOUS INSUFFICIENCY 09/18/2008    Past Surgical History:  Procedure Laterality Date  . CARDIAC CATHETERIZATION  11/01/1990  . CARDIOVASCULAR STRESS TEST  06/03/2010   Persantine perfusion EF 45% mild to moderate ischemia basal and mid inferolateral region  . CARDIOVASCULAR STRESS TEST  01/01/2008   left ventricle normal,no significant ischemia  . CARDIOVASCULAR STRESS TEST  09/09/2005    possibility of ischemia inferior wall  . COLONOSCOPY  07/14/2009   diverticulosis, internal hemorrhoids  . CORONARY ARTERY BYPASS GRAFT  06/13/2008   graft x1 w/vein  graft LAD artery by Dr. Amador [facility]as at [facility]  . CORONARY STENT PLACEMENT  03/24/2014   DES to LAD  . CYSTECTOMY     Left leg  . INTRAOPERATIVE TRANSESOPHAGEAL ECHOCARDIOGRAM N/A 04/22/2014   Procedure: INTRAOPERATIVE TRANSTHORACIC ECHOCARDIOGRAM;  Surgeon: [name], MD;  Location: [place];  Service: Open Heart Surgery;  Laterality: N/A;  . LARYNGOPLASTY Left 06/07/2017   Procedure: LARYNGOPLASTY;  Surgeon: Melida Quitter, MD;  Location:  [place];  Service: ENT;  Laterality: Left;  LEFT MEDIALIZATION LARYNGOPLASTY  LOCAL WITH IV SEDATION  . LEFT AND RIGHT HEART CATHETERIZATION WITH CORONARY ANGIOGRAM N/A 01/08/2014   Procedure: LEFT AND RIGHT HEART CATHETERIZATION WITH CORONARY ANGIOGRAM;  Surgeon: Sanda Klein, MD;  Location: [place] CATH LAB;  Service: Cardiovascular;  Laterality: N/A;  . MITRAL VALVE REPLACEMENT  06/13/2008   #20 St. Jude Bicor mitral valve ;Maze procedure at [facility] by Dr. Amador Cunas  . PACEMAKER PLACEMENT  07/13/2008   Adapata dual chamber-Medtronic at [facility]   . PERCUTANEOUS CORONARY ROTOBLATOR INTERVENTION (PCI-R) N/A 03/24/2014   Procedure: PERCUTANEOUS CORONARY ROTOBLATOR INTERVENTION (PCI-R);  Surgeon: Blane Ohara, MD;  Location: [facility] CATH LAB;  Service: Cardiovascular;  Laterality: N/A;  . SKIN CANCER EXCISION     removed from forehead and right leg, nose/face  . TEE WITHOUT CARDIOVERSION N/A 01/22/2014   Procedure: TRANSESOPHAGEAL ECHOCARDIOGRAM (TEE);  Surgeon: Sanda Klein, MD;  Location: [facility] ENDOSCOPY;  Service: Cardiovascular;  Laterality: N/A;  . TONSILLECTOMY    . TRANSCATHETER AORTIC VALVE REPLACEMENT, TRANSFEMORAL N/A 04/22/2014   Procedure: TRANSCATHETER AORTIC VALVE REPLACEMENT, TRANSFEMORAL;  Surgeon: [name], MD;  Location: [place];  Service: Open Heart Surgery;  Laterality: N/A;  TAVR-TF (RIGHT SIDE)  . UPPER GASTROINTESTINAL ENDOSCOPY  07/14/2009   erosive reflux esopagitis, Barrett's esophagus    Social History   Social History  . Marital status: Married    Spouse name: N/A  . Number of children: 4  . Years of education: N/A   Occupational History  . Retired Retired   Social History Main Topics  . Smoking status: Former Smoker    Years: 2.00    Types: Pipe    Quit date: 11/07/1965  . Smokeless tobacco: Never Used     Comment: quit over 40 years ago, never smoked cigarettes  . Alcohol use No  . Drug use: No  . Sexual activity: Not on file   Other Topics Concern  . Not on file     Social History Narrative  . No narrative on file    Current Outpatient Prescriptions on File Prior to Visit  Medication Sig Dispense Refill  . clopidogrel (PLAVIX) 75 MG tablet Take 1 tablet (75 mg total) by mouth daily with breakfast. 90 tablet 3  . furosemide (LASIX) 40 MG tablet Pt takes one tablet on Monday, Wednesday, and Friday.  Pt takes two tablets on Sunday, Tuesday, Thursday, and Saturday. 30 tablet   . Multiple Vitamin (MULTIVITAMIN WITH MINERALS) TABS Take 1 tablet by mouth daily. Centrum Silver    . pantoprazole (PROTONIX) 40 MG tablet Take 1 tablet (40 mg total) by mouth daily. 90 tablet 2  . potassium chloride SA (K-DUR,KLOR-CON) 20 MEQ tablet Take 20 mEq by mouth daily.    . simvastatin (ZOCOR) 40 MG tablet Take 1 tablet (40 mg total) by mouth at bedtime. 90 tablet 1  . Tamsulosin HCl (FLOMAX) 0.4 MG CAPS Take 0.4 mg by mouth at bedtime.      No current facility-administered medications on file prior to visit.  No Known Allergies  Family History  Problem Relation Age of Onset  . Breast cancer Mother   . Stroke Mother   . Transient ischemic attack Mother     BP 128/72   Pulse (!) 102   Wt 162 lb 6.4 oz (73.7 kg)   SpO2 94%   BMI 23.30 kg/m     Review of Systems Denies fever, visual loss, hearing loss, chest pain, back pain, depression, BRBPR, hematuria, syncope, numbness, and allergy sxs.  He has chronic fatigue, cold intolerance, dry skin, easy bruising, and doe.      Objective:   Physical Exam VS: see vs page GEN: no distress HEAD: head: no deformity.  Old healed surgical scar ant to the left ear.  eyes: no periorbital swelling, no proptosis external nose and ears are normal mouth: no lesion seen NECK: supple, thyroid is not enlarged CHEST WALL: moderate kyphosis LUNGS: clear to auscultation BREASTS:  No gynecomastia CV: reg rate but irreg rhythm, no murmur ABD: abdomen is soft, nontender.  no hepatosplenomegaly.  not distended.  no hernia.    GENITALIA/RECTAL/PROSTATE: sees urology.  MUSCULOSKELETAL: muscle bulk and strength are grossly normal.  no obvious joint swelling.  gait is normal and steady EXTEMITIES: no deformity.  no ulcer on the feet.  feet are of normal color and temp.  1+ bilat leg edema, and hyperpigmentation.  PULSES: dorsalis pedis intact bilat.  no carotid bruit NEURO:  cn 2-12 grossly intact.   readily moves all 4's.  sensation is intact to touch on the feet SKIN:  Normal texture and temperature.  Multiple ecchymoses of the forearms.  Mult AK's on the face.  Severe eczematous changes of the legs.  NODES:  None palpable at the neck PSYCH: alert, well-oriented.  Does not appear anxious nor depressed.      Assessment & Plan:  Wellness visit today, with problems stable, except as noted.   SEPARATE EVALUATION FOLLOWS--EACH PROBLEM HERE IS NEW, NOT RESPONDING TO TREATMENT, OR POSES SIGNIFICANT RISK TO THE PATIENT'S HEALTH: HISTORY OF THE PRESENT ILLNESS: Pt states few days of slight swelling of the left wrist, but no assoc pain.   PAST MEDICAL HISTORY Past Medical History:  Diagnosis Date  . ANEMIA, IRON DEFICIENCY 10/13/2008  . ANXIETY 09/04/2008  . Aortic valvular disorder 08/16/2012   echo EF 45-50% LV mildly reduce,Biocor mitral valve,mild to mod. tricuspid regrug.,mild to mod. aortic regrug.  . Aortic valvular stenosis 11/19/2013  . ATRIAL FIBRILLATION, CHRONIC 01/14/2008   on warfarin since 2007  . BARRETTS ESOPHAGUS 03/20/2009  . BASAL CELL CARCINOMA OF SKIN SITE UNSPECIFIED 09/28/2010  . Cardiomyopathy, ischemic 11/19/2013   EF 45-50% with inferior wall hypokinesis by echo 2014  . CATARACTS, BILATERAL, HX OF 01/14/2008  . CEREBROVASCULAR ACCIDENT, HX OF 08/18/2008  . CHF (congestive heart failure) ([place])   . COAGULOPATHY, COUMADIN-INDUCED 01/14/2008  . COLONIC POLYPS, ADENOMATOUS 01/14/2008  . CORONARY ARTERY DISEASE 06/01/2007   Cath 04/03/2008; Cath 11/01/1990  . Dyspnea    on exertion started couple  months ago  . Dysrhythmia    HX OF TACHY BRADY  . Edema, peripheral 07/30/2009   LEV doppler- no thrombus or thrombophlebitis  . Elevated PSA   . GASTRIC POLYP 05/13/2009  . GERD 06/01/2007  . History of hiatal hernia   . Hyperglycemia   . HYPERLIPIDEMIA 06/01/2007  . HYPERTENSION 06/01/2007  . HYPERTHYROIDISM 01/06/2011  . Mitral regurgitation 06/13/2008   mitral valve replacement #20 St. Jude Biocor; Maze procedure by Dr. [facility]  . Mitral stenosis    Porcine bioprosthetic tissue valve placed 06/13/2008  . Mitral valve disorder 09/08/2011   echo EF 50-55% LV normal  . Pacemaker   . Peripheral artery disease ([place]) 06/16/2010   LEA doppler-left ABI nml 0.61 calcified vessels;right ABI  0.68  . PERSONAL HX COLONIC POLYPS 01/01/2010  . PLEURAL EFFUSION, LEFT 10/13/2008  . Prostate cancer [facility])    s/p radiation  . PROSTATE CANCER, HX OF 01/14/2008  . Prosthetic valve dysfunction    Mitral stenosis involving bioprosthetic tissue valve placed 06/13/2008  . Restless leg syndrome   . S/P CABG x 1 06/13/2008   SVG to LAD with open vein harvest right thigh, LIMA harvested but not utilized by Dr Amador Cunas  . S/P Maze operation for atrial fibrillation 06/13/2008   Partial left atrial lesion set using cryothermy for bilateral pulmonary vein isolation by Dr Amador Cunas  . S/P mitral valve replacement with bioprosthetic valve 06/13/2008   48mm St Jude Biocor porcine bioprosthetic tissue valve by Dr Amador Cunas  . 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 Tachy-brady syndrome ([place]) 07/13/2008   medtronic Adapta gen. model ADDR01 ,serial # [id number] H by Dr  [name] at [facility]  . UNSPECIFIED VENOUS INSUFFICIENCY 09/18/2008    Past Surgical History:  Procedure Laterality Date  . CARDIAC CATHETERIZATION  11/01/1990  . CARDIOVASCULAR STRESS TEST  06/03/2010   Persantine perfusion EF 45%  mild to moderate ischemia basal and mid inferolateral region  . CARDIOVASCULAR STRESS TEST  01/01/2008   left ventricle normal,no significant ischemia  . CARDIOVASCULAR STRESS TEST  09/09/2005    possibility of ischemia inferior wall  . COLONOSCOPY  07/14/2009   diverticulosis, internal hemorrhoids  . CORONARY ARTERY BYPASS GRAFT  06/13/2008   graft x1 w/vein graft LAD artery by Dr. Amador [facility] at [facility]  . CORONARY STENT PLACEMENT  03/24/2014   DES to LAD  . CYSTECTOMY     Left leg  . INTRAOPERATIVE TRANSESOPHAGEAL ECHOCARDIOGRAM N/A 04/22/2014   Procedure: INTRAOPERATIVE TRANSTHORACIC ECHOCARDIOGRAM;  Surgeon: [name], MD;  Location: [place];  Service: Open Heart Surgery;  Laterality: N/A;  . LARYNGOPLASTY Left 06/07/2017   Procedure: LARYNGOPLASTY;  Surgeon: Melida Quitter, MD;  Location: [place];  Service: ENT;  Laterality: Left;  LEFT MEDIALIZATION LARYNGOPLASTY  LOCAL WITH IV SEDATION  . LEFT AND RIGHT HEART CATHETERIZATION WITH CORONARY ANGIOGRAM N/A 01/08/2014   Procedure: LEFT AND RIGHT HEART CATHETERIZATION WITH CORONARY ANGIOGRAM;  Surgeon: Sanda Klein, MD;  Location: [place] CATH LAB;  Service: Cardiovascular;  Laterality: N/A;  . MITRAL VALVE REPLACEMENT  06/13/2008   #20 St. Jude Bicor mitral valve ;Maze procedure at [facility] by Dr. Amador Cunas  . PACEMAKER PLACEMENT  07/13/2008   Adapata dual chamber-Medtronic at [facility]   . PERCUTANEOUS CORONARY ROTOBLATOR INTERVENTION (PCI-R) N/A 03/24/2014   Procedure: PERCUTANEOUS CORONARY ROTOBLATOR INTERVENTION (PCI-R);  Surgeon: Blane Ohara, MD;  Location: [facility] CATH LAB;  Service: Cardiovascular;  Laterality: N/A;  . SKIN CANCER EXCISION     removed from forehead and right leg, nose/face  . TEE WITHOUT CARDIOVERSION N/A 01/22/2014   Procedure: TRANSESOPHAGEAL ECHOCARDIOGRAM (TEE);  Surgeon: Sanda Klein, MD;  Location: [facility] ENDOSCOPY;  Service: Cardiovascular;  Laterality: N/A;  . TONSILLECTOMY    . TRANSCATHETER AORTIC VALVE REPLACEMENT, TRANSFEMORAL  N/A 04/22/2014   Procedure: TRANSCATHETER AORTIC VALVE REPLACEMENT, TRANSFEMORAL;  Surgeon: [name], MD;  Location: [facility]  OR;  Service: Open Heart Surgery;  Laterality: N/A;  TAVR-TF (RIGHT SIDE)  . UPPER GASTROINTESTINAL ENDOSCOPY  07/14/2009   erosive reflux esopagitis, Barrett's esophagus    Social History   Social History  . Marital status: Married    Spouse name: N/A  . Number of children: 4  . Years of education: N/A   Occupational History  . Retired Retired   Social History Main Topics  . Smoking status: Former Smoker    Years: 2.00    Types: Pipe    Quit date: 11/07/1965  . Smokeless tobacco: Never Used     Comment: quit over 40 years ago, never smoked cigarettes  . Alcohol use No  . Drug use: No  . Sexual activity: Not on file   Other Topics Concern  . Not on file   Social History Narrative  . No narrative on file    Current Outpatient Prescriptions on File Prior to Visit  Medication Sig Dispense Refill  . clopidogrel (PLAVIX) 75 MG tablet Take 1 tablet (75 mg total) by mouth daily with breakfast. 90 tablet 3  . furosemide (LASIX) 40 MG tablet Pt takes one tablet on Monday, Wednesday, and Friday.  Pt takes two tablets on Sunday, Tuesday, Thursday, and Saturday. 30 tablet   . Multiple Vitamin (MULTIVITAMIN WITH MINERALS) TABS Take 1 tablet by mouth daily. Centrum Silver    . pantoprazole (PROTONIX) 40 MG tablet Take 1 tablet (40 mg total) by mouth daily. 90 tablet 2  . potassium chloride SA (K-DUR,KLOR-CON) 20 MEQ tablet Take 20 mEq by mouth daily.    . simvastatin (ZOCOR) 40 MG tablet Take 1 tablet (40 mg total) by mouth at bedtime. 90 tablet 1  . Tamsulosin HCl (FLOMAX) 0.4 MG CAPS Take 0.4 mg by mouth at bedtime.      No current facility-administered medications on file prior to visit.     No Known Allergies  Family History  Problem Relation Age of Onset  . Breast cancer Mother   . Stroke Mother   . Transient ischemic attack Mother     BP 128/72    Pulse (!) 102   Wt 162 lb 6.4 oz (73.7 kg)   SpO2 94%   BMI 23.30 kg/m   REVIEW OF SYSTEMS: Denies itching PHYSICAL EXAMINATION: VITAL SIGNS:  See vs page GENERAL: no distress Wrists: severe eczematous rash Skin: dyshidrosis of the wrists LAB/XRAY RESULTS: Lab Results  Component Value Date   TSH 1.62 08/22/2017  CXR: demineralization ob bones IMPRESSION: Rash, new, uncertain etiology osteoporosis PLAN:  I rx'ed TAC cream Check DEXA

## 2017-08-22 NOTE — Patient Instructions (Addendum)
Let's check a bone density test.  Please consider these measures for your health:  minimize alcohol.  Do not use tobacco products.  Have a colonoscopy at least every 10 years from age 83.  Keep firearms safely stored.  Always use seat belts.  have working smoke alarms in your home.  See an eye doctor and dentist regularly.  Never drive under the influence of alcohol or drugs (including prescription drugs).  Those with fair skin should take precautions against the sun, and should carefully examine their skin once per month, for any new or changed moles. It is critically important to prevent falling down (keep floor areas well-lit, dry, and free of loose objects.  If you have a cane, walker, or wheelchair, you should use it, even for short trips around the house.  Wear flat-soled shoes.  Also, try not to rush).   blood tests are requested for you today.  We'll let you know about the results.  Please come back for a follow-up appointment in 3 months.

## 2017-08-23 LAB — PTH, INTACT AND CALCIUM
Calcium: 8.6 mg/dL (ref 8.6–10.3)
PTH: 52 pg/mL (ref 14–64)

## 2017-08-24 ENCOUNTER — Telehealth: Payer: Self-pay | Admitting: Cardiovascular Disease

## 2017-08-24 NOTE — Telephone Encounter (Signed)
New message    Pt c/o Shortness Of Breath: STAT if SOB developed within the last 24 hours or pt is noticeably SOB on the phone  1. Are you currently SOB (can you hear that pt is SOB on the phone)? yes  2. How long have you been experiencing SOB? 2-3 day getting worse  3. Are you SOB when sitting or when up moving around? Both   4. Are you currently experiencing any other symptoms? Tired and weak

## 2017-08-24 NOTE — Telephone Encounter (Deleted)
Advised patient seek ED eval for worsening SOB this evening.   Of note, he does not want to take additional lasix this late in the evening.

## 2017-08-24 NOTE — Telephone Encounter (Addendum)
Returned call to patient of Dr. [name]. He reports worsening symptoms of shortness of breath. He is audibly short of breath on the phone and had to sit down no more than 2 minutes into our conversation. He reports weakness & fatigue. He has currently uses home O2 at night. He states he feels like he may need to use O2 during the daytime. He does not have a pulse oximeter to check his O2 sats at home. He reports swelling in his left hand. His PCP evaluated this and he states did not do anything. He reports a little swelling of his left foot. He takes laisx 40mg  daily and will sometimes take the additional 40mg  4x/week - depending on his plans for the day. He reports good UOP. He avoids salt/sodium in his diet. He also reports he has a hiatal hernia that is impeding his lung expansion and also causing breathing difficulties.   Advised patient to take lasix 40mg  x2 tablets tomorrow Friday 10/19 and we will call him tomorrow with further instructions. He does not want to take additional lasix this late in the evening.   Advised ED eval for worsening SOB.  Explained that missing doses of lasix as prescribed can cause volume overload - he has chronic HF  Routed to Dr. [name] and will review with DOD on 10/19

## 2017-08-25 NOTE — Telephone Encounter (Signed)
Weight is much higher at his recent office visit w Dr. Loanne Drilling, compared to last hospital DC in August (>20 lb higher). Needs more aggressive diuresis. Did he go to D? MCr

## 2017-08-25 NOTE — Telephone Encounter (Signed)
Called patient to follow up on symptoms from yesterday - no improvement thus far. He took 2 lasix this AM. He has had good UOP. He did not weigh today.   Advised that he take lasix 40mg  BID tomorrow & Sunday (as prescribed) - but he reported yesterday during our conversation that he does not generally take 2 daily as directed depending on his plans  Scheduled to see [place], [place] @ 0900  Advised ED eval for worsening SOB

## 2017-08-28 ENCOUNTER — Encounter: Payer: Self-pay | Admitting: Physician Assistant

## 2017-08-28 ENCOUNTER — Ambulatory Visit (INDEPENDENT_AMBULATORY_CARE_PROVIDER_SITE_OTHER): Payer: Medicare Other | Admitting: Physician Assistant

## 2017-08-28 VITALS — BP 144/90 | HR 121 | Ht 70.0 in | Wt 157.4 lb

## 2017-08-28 DIAGNOSIS — I5043 Acute on chronic combined systolic (congestive) and diastolic (congestive) heart failure: Secondary | ICD-10-CM | POA: Diagnosis not present

## 2017-08-28 DIAGNOSIS — I482 Chronic atrial fibrillation: Secondary | ICD-10-CM

## 2017-08-28 DIAGNOSIS — I4821 Permanent atrial fibrillation: Secondary | ICD-10-CM

## 2017-08-28 LAB — BASIC METABOLIC PANEL
BUN / CREAT RATIO: 25 — AB (ref 10–24)
BUN: 23 mg/dL (ref 8–27)
CALCIUM: 8.9 mg/dL (ref 8.6–10.2)
CO2: 31 mmol/L — ABNORMAL HIGH (ref 20–29)
Chloride: 103 mmol/L (ref 96–106)
Creatinine, Ser: 0.93 mg/dL (ref 0.76–1.27)
GFR, EST AFRICAN AMERICAN: 86 mL/min/{1.73_m2} (ref >59)
GFR, EST NON AFRICAN AMERICAN: 74 mL/min/{1.73_m2} (ref >59)
Glucose: 92 mg/dL (ref 65–99)
Potassium: 4 mmol/L (ref 3.5–5.2)
Sodium: 151 mmol/L — ABNORMAL HIGH (ref 134–144)

## 2017-08-28 MED ORDER — METOPROLOL SUCCINATE ER 25 MG PO TB24: 25.0000 mg | ORAL_TABLET | Freq: Every day | ORAL | 3 refills | Status: AC

## 2017-08-28 MED ORDER — METOPROLOL SUCCINATE ER 25 MG PO TB24: 25.0000 mg | ORAL_TABLET | Freq: Every day | ORAL | 3 refills | Status: DC

## 2017-08-28 NOTE — Progress Notes (Signed)
Thank you,  MCr 

## 2017-08-28 NOTE — Progress Notes (Signed)
Cardiology Office Note   Date:  08/28/2017   ID:  [name] [name], DOB [birth year], MRN [id number]  PCP:  [name], MD  Cardiologist:  Dr. [name], 03/01/2017  Rosaria Ferries, PA-C   Chief Complaint  Patient presents with  . Shortness of Breath    pt states having some SOB when walking and sitting     History of Present Illness: [name] [name] is a 83 y.o. male with a history of MVR, chronic afib, AS>>TAVR, CVA (post-CABG), S-D-CHF (EF had dropped to 40% but now improved), anemia, HTN, HLD, hyperthyroid now off methimazole, CABG 2009 w/ SVG-LAD (MAZE and St Jude Biocor bioprosthetic MVR also), MDT Adapta PPM, hx GIB>>anticoag w/ Plavix only, DES LAD 2015, GERD w/ Barrett's esophagus. Lg diaphragmatic hernia with gastric and colonic contents in the mid thorax. This prevents imaging of his heart from transesophageal views   [name] [name] presents for cardiology follow up.   He is concerned that his heart rate is too high.   His breathing is short most of the time now. It started changing a week ago. It gradually got worse, he started getting SOB walking. He has been put on home O2.   He then started noticing his heart rate was higher than normal.  He had not been weighing himself, has started. Weights 154>>152>>153 last 3 days. He had skipped some Lasix doses because he had something to do that day. He struggles with frequent urination when he takes the Lasix. He has significant DOE. He has minimal LE edema. He denies PND.   He has not had chest pain. He has not had presyncope or syncope. He is aware that his HR is higher than normal.   According to his wife, he drinks an entire pot of coffee daily and copious additional liquids. He was given fluid restrictions of 2 L daily. He does not add salt to foods but there may be intrinsic sodium and the foods he eats. He is encouraged to look for this and try to limit.   Past Medical History:  Diagnosis Date  . ANEMIA, IRON  DEFICIENCY 10/13/2008  . ANXIETY 09/04/2008  . Aortic valvular disorder 08/16/2012   echo EF 45-50% LV mildly reduce,Biocor mitral valve,mild to mod. tricuspid regrug.,mild to mod. aortic regrug.  . Aortic valvular stenosis 11/19/2013  . ATRIAL FIBRILLATION, CHRONIC 01/14/2008   on warfarin since 2007  . BARRETTS ESOPHAGUS 03/20/2009  . BASAL CELL CARCINOMA OF SKIN SITE UNSPECIFIED 09/28/2010  . Cardiomyopathy, ischemic 11/19/2013   EF 45-50% with inferior wall hypokinesis by echo 2014  . CATARACTS, BILATERAL, HX OF 01/14/2008  . CEREBROVASCULAR ACCIDENT, HX OF 08/18/2008  . CHF (congestive heart failure) ([place])   . COAGULOPATHY, COUMADIN-INDUCED 01/14/2008  . COLONIC POLYPS, ADENOMATOUS 01/14/2008  . CORONARY ARTERY DISEASE 06/01/2007   Cath 04/03/2008; Cath 11/01/1990  . Dyspnea    on exertion started couple months ago  . Dysrhythmia    HX OF [place]  . Edema, peripheral 07/30/2009   LEV doppler- no thrombus or thrombophlebitis  . Elevated PSA   . GASTRIC POLYP 05/13/2009  . GERD 06/01/2007  . History of hiatal hernia   . Hyperglycemia   . HYPERLIPIDEMIA 06/01/2007  . HYPERTENSION 06/01/2007  . HYPERTHYROIDISM 01/06/2011  . Mitral regurgitation 06/13/2008   mitral valve replacement #20 St. Jude Biocor; Maze procedure by Dr. Amador Cunas  at [facility]  . Mitral stenosis    Porcine bioprosthetic tissue valve placed 06/13/2008  . Mitral valve disorder  09/08/2011   echo EF 50-55% LV normal  . Pacemaker   . Peripheral artery disease ([place]) 06/16/2010   LEA doppler-left ABI nml 0.61 calcified vessels;right ABI  0.68  . PERSONAL HX COLONIC POLYPS 01/01/2010  . PLEURAL EFFUSION, LEFT 10/13/2008  . Prostate cancer [facility])    s/p radiation  . PROSTATE CANCER, HX OF 01/14/2008  . Prosthetic valve dysfunction    Mitral stenosis involving bioprosthetic tissue valve placed 06/13/2008  . Restless leg syndrome   . S/P CABG x 1 06/13/2008   SVG to LAD with open vein harvest right thigh, LIMA harvested but not utilized by  Dr Amador Cunas  . S/P Maze operation for atrial fibrillation 06/13/2008   Partial left atrial lesion set using cryothermy for bilateral pulmonary vein isolation by Dr Amador Cunas  . S/P mitral valve replacement with bioprosthetic valve 06/13/2008   36mm St Jude Biocor porcine bioprosthetic tissue valve by Dr Amador Cunas  . 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 Tachy-brady syndrome ([place]) 07/13/2008   medtronic Adapta gen. model ADDR01 ,serial # [id number] H by Dr  [name] at [facility]  . UNSPECIFIED VENOUS INSUFFICIENCY 09/18/2008    Past Surgical History:  Procedure Laterality Date  . CARDIAC CATHETERIZATION  11/01/1990  . CARDIOVASCULAR STRESS TEST  06/03/2010   Persantine perfusion EF 45% mild to moderate ischemia basal and mid inferolateral region  . CARDIOVASCULAR STRESS TEST  01/01/2008   left ventricle normal,no significant ischemia  . CARDIOVASCULAR STRESS TEST  09/09/2005    possibility of ischemia inferior wall  . COLONOSCOPY  07/14/2009   diverticulosis, internal hemorrhoids  . CORONARY ARTERY BYPASS GRAFT  06/13/2008   graft x1 w/vein graft LAD artery by Dr. Amador Cunas at [facility]  . CORONARY STENT PLACEMENT  03/24/2014   DES to LAD  . CYSTECTOMY     Left leg  . INTRAOPERATIVE TRANSESOPHAGEAL ECHOCARDIOGRAM N/A 04/22/2014   Procedure: INTRAOPERATIVE TRANSTHORACIC ECHOCARDIOGRAM;  Surgeon: [name], MD;  Location: [place];  Service: Open Heart Surgery;  Laterality: N/A;  . LARYNGOPLASTY Left 06/07/2017   Procedure: LARYNGOPLASTY;  Surgeon: Melida Quitter, MD;  Location: [place];  Service: ENT;  Laterality: Left;  LEFT MEDIALIZATION LARYNGOPLASTY  LOCAL WITH IV SEDATION  . LEFT AND RIGHT HEART CATHETERIZATION WITH CORONARY ANGIOGRAM N/A 01/08/2014   Procedure: LEFT AND RIGHT HEART CATHETERIZATION WITH CORONARY ANGIOGRAM;  Surgeon: Sanda Klein, MD;  Location: [place] CATH LAB;   Service: Cardiovascular;  Laterality: N/A;  . MITRAL VALVE REPLACEMENT  06/13/2008   #20 St. Jude Bicor mitral valve ;Maze procedure at [facility] by Dr. Amador Cunas  . PACEMAKER PLACEMENT  07/13/2008   [place] dual chamber-Medtronic at [facility]   . PERCUTANEOUS CORONARY ROTOBLATOR INTERVENTION (PCI-R) N/A 03/24/2014   Procedure: PERCUTANEOUS CORONARY ROTOBLATOR INTERVENTION (PCI-R);  Surgeon: Blane Ohara, MD;  Location: [facility] CATH LAB;  Service: Cardiovascular;  Laterality: N/A;  . SKIN CANCER EXCISION     removed from forehead and right leg, nose/face  . TEE WITHOUT CARDIOVERSION N/A 01/22/2014   Procedure: TRANSESOPHAGEAL ECHOCARDIOGRAM (TEE);  Surgeon: Sanda Klein, MD;  Location: [facility] ENDOSCOPY;  Service: Cardiovascular;  Laterality: N/A;  . TONSILLECTOMY    . TRANSCATHETER AORTIC VALVE REPLACEMENT, TRANSFEMORAL N/A 04/22/2014   Procedure: TRANSCATHETER AORTIC VALVE REPLACEMENT, TRANSFEMORAL;  Surgeon: [name], MD;  Location: [place];  Service: Open Heart Surgery;  Laterality: N/A;  TAVR-TF (RIGHT SIDE)  . UPPER GASTROINTESTINAL ENDOSCOPY  07/14/2009   erosive reflux esopagitis, Barrett's esophagus    Current Outpatient Prescriptions  Medication Sig Dispense Refill  . clopidogrel (PLAVIX) 75 MG tablet Take 1 tablet (75 mg total) by mouth daily with breakfast. 90 tablet 3  . furosemide (LASIX) 40 MG tablet Pt takes one tablet on Monday, Wednesday, and Friday.  Pt takes two tablets on Sunday, Tuesday, Thursday, and Saturday. 30 tablet   . Multiple Vitamin (MULTIVITAMIN WITH MINERALS) TABS Take 1 tablet by mouth daily. Centrum Silver    . pantoprazole (PROTONIX) 40 MG tablet Take 1 tablet (40 mg total) by mouth daily. 90 tablet 2  . potassium chloride SA (K-DUR,KLOR-CON) 20 MEQ tablet Take 20 mEq by mouth daily.    . simvastatin (ZOCOR) 40 MG tablet Take 1 tablet (40 mg total) by mouth at bedtime. 90 tablet 1  . Tamsulosin HCl (FLOMAX) 0.4 MG CAPS Take 0.4 mg by mouth at bedtime.     . triamcinolone  (KENALOG) 0.025 % cream Apply 1 application topically 3 (three) times daily as needed. For rash 80 g 3   No current facility-administered medications for this visit.     Allergies:   Patient has no known allergies.    Social History:  The patient  reports that he quit smoking about 51 years ago. His smoking use included Pipe. He quit after 2.00 years of use. He has never used smokeless tobacco. He reports that he does not drink alcohol or use drugs.   Family History:  The patient's family history includes Breast cancer in his mother; Stroke in his mother; Transient ischemic attack in his mother.    ROS:  Please see the history of present illness. All other systems are reviewed and negative.    PHYSICAL EXAM: VS:  BP (!) 144/90   Pulse (!) 121   Ht 5\' 10"  (1.778 m)   Wt 157 lb 6.4 oz (71.4 kg)   SpO2 90%   BMI 22.58 kg/m  , BMI Body mass index is 22.58 kg/m. GEN: Well nourished, well developed, male in no acute distress  HEENT: normal for age  Neck: JVD 11 cm, no carotid bruit, no masses Cardiac: Rapid and irregular R&R; + murmur, no rubs, or gallops Respiratory:  clear to auscultation bilaterally, normal work of breathing GI: soft, nontender, nondistended, + BS MS: no deformity or atrophy; trace-1+ edema; distal pulses are 2+ in all 4 extremities   Skin: warm and dry, no rash Neuro:  Strength and sensation are intact Psych: euthymic mood, full affect   EKG:  EKG is ordered today. The ekg ordered today demonstrates atrial fib, RVR, HR 121. Lateral ST changes are old. ?LVH  ECHO: 06/26/2017 - Left ventricle: The cavity size was normal. Wall thickness was   increased in a pattern of moderate LVH. Systolic function was   normal. The estimated ejection fraction was in the range of 60%   to 65%. Wall motion was normal; there were no regional wall   motion abnormalities. - Aortic valve: A bioprosthesis was present. There was trivial   regurgitation. - Mitral valve: A  bioprosthesis was present. Valve area by pressure   half-time: 1.62 cm^2. - Left atrium: The atrium was moderately dilated Impressions: - Normal LV systolic function; moderate LVH; s/p AVR with trace AI   and normal mean gradient of 5 mmHg; s/p MVR with normal mean   gradient of 4 mmHg and no MR; moderate LAE; mild TR.   Recent Labs: 12/29/2016: Pro B Natriuretic peptide (  BNP) 185.0 04/27/2017: ALT 14; Magnesium 2.2 06/25/2017: B Natriuretic Peptide 447.4 06/27/2017: BUN 23; Creatinine, Ser 0.92; Hemoglobin 11.5; Platelets 171; Potassium 3.5; Sodium 144 08/22/2017: TSH 1.62    Lipid Panel    Component Value Date/Time   CHOL 119 08/22/2017 1140   TRIG 39.0 08/22/2017 1140   HDL 48.90 08/22/2017 1140   CHOLHDL 2 08/22/2017 1140   VLDL 7.8 08/22/2017 1140   LDLCALC 62 08/22/2017 1140     Wt Readings from Last 3 Encounters:  08/28/17 157 lb 6.4 oz (71.4 kg)  08/22/17 162 lb 6.4 oz (73.7 kg)  06/27/17 141 lb 4.8 oz (64.1 kg)     Other studies Reviewed: Additional studies/ records that were reviewed today include: office notes, hospital records and testing.  ASSESSMENT AND PLAN:  1.  Acute on Chronic combined systolic and diastolic CHF: His volume status is a little higher today than normal despite his weight not having changed in the last few days on his home scales. He is encouraged to continue daily weights. He is to take Lasix 40 mg twice a day today and tomorrow. He is also to take extra potassium.  There may be dietary noncompliance with sodium and fluid restrictions, the need for these is reinforced. We will check a BMET today. He will have follow-up in approximately 2 weeks with more labs.  2. Chronic atrial fibrillation: His heart rate is elevated today. He has based at times in the past and has not been on a beta blocker. However, he is rarely pacing and his heart rate is elevated. Add Toprol-XL 25 mg daily.  3. Anemia: He has a history of anemia secondary to GI bleed.  He is not aware of any blood in his stools recently and his hemoglobin was over 11 in August.   Current medicines are reviewed at length with the patient today.  The patient does not have concerns regarding medicines.  The following changes have been made:  Increase Lasix and potassium for 2 days  Labs/ tests ordered today include:   Orders Placed This Encounter  Procedures  . Basic metabolic panel     Disposition:   FU with Dr. [name]  Signed, Rosaria Ferries, PA-C  08/28/2017 3:29 PM    [place] Phone: [phone]; Fax: [phone]  This note was written with the assistance of speech recognition software. Please excuse any transcriptional errors.

## 2017-08-28 NOTE — Patient Instructions (Addendum)
Medication Instructions:  TAKE LASIX 40MG  2 TABLETS DAILY FOR 2 DAYS (TODAY AND TOMORROW)  TAKE EXTRA POTASSIUM FOR 2 DAYS (TODAY AND TOMORROW)  START METOPROLOL XL 25MG  DAILY If you need a refill on your cardiac medications before your next appointment, please call your pharmacy.  Labwork: BMET TODAY HERE IN OUR OFFICE AT [facility]  Follow-Up: Your physician wants you to follow-up in: 1-2 [place]- [place].  Special Instructions: LOG WEIGHTS DAILY-CALL IF WEIGHT INCREASES >3 POUNDS A DAY OR 5 POUNDS IN A WEEK  2,000MG  DAILY LOW SODIUM DIET  1-2 LITERS/QUARTS ALL FLUIDS RESTRICTION DAILY  Thank you for choosing [facility] at [facility]!!     Low-Sodium Eating Plan Sodium, which is an element that makes up salt, helps you maintain a healthy balance of fluids in your body. Too much sodium can increase your blood pressure and cause fluid and waste to be held in your body. Your health care provider or dietitian may recommend following this plan if you have high blood pressure (hypertension), kidney disease, liver disease, or heart failure. Eating less sodium can help lower your blood pressure, reduce swelling, and protect your heart, liver, and kidneys. What are tips for following this plan? General guidelines  Most people on this plan should limit their sodium intake to 1,500-2,000 mg (milligrams) of sodium each day. Reading food labels  The Nutrition Facts label lists the amount of sodium in one serving of the food. If you eat more than one serving, you must multiply the listed amount of sodium by the number of servings.  Choose foods with less than 140 mg of sodium per serving.  Avoid foods with 300 mg of sodium or more per serving. Shopping  Look for lower-sodium products, often labeled as "low-sodium" or "no salt added."  Always check the sodium content even if foods are labeled as "unsalted" or "no salt added".  Buy fresh foods. ? Avoid canned foods and  premade or frozen meals. ? Avoid canned, cured, or processed meats  Buy breads that have less than 80 mg of sodium per slice. Cooking  Eat more home-cooked food and less restaurant, buffet, and fast food.  Avoid adding salt when cooking. Use salt-free seasonings or herbs instead of table salt or sea salt. Check with your health care provider or pharmacist before using salt substitutes.  Cook with plant-based oils, such as canola, sunflower, or olive oil. Meal planning  When eating at a restaurant, ask that your food be prepared with less salt or no salt, if possible.  Avoid foods that contain MSG (monosodium glutamate). MSG is sometimes added to [place] food, bouillon, and some canned foods. What foods are recommended? The items listed may not be a complete list. Talk with your dietitian about what dietary choices are best for you. Grains Low-sodium cereals, including oats, puffed wheat and rice, and shredded wheat. Low-sodium crackers. Unsalted rice. Unsalted pasta. Low-sodium bread. Whole-grain breads and whole-grain pasta. Vegetables Fresh or frozen vegetables. "No salt added" canned vegetables. "No salt added" tomato sauce and paste. Low-sodium or reduced-sodium tomato and vegetable juice. Fruits Fresh, frozen, or canned fruit. Fruit juice. Meats and other protein foods Fresh or frozen (no salt added) meat, poultry, seafood, and fish. Low-sodium canned tuna and salmon. Unsalted nuts. Dried peas, beans, and lentils without added salt. Unsalted canned beans. Eggs. Unsalted nut butters. Dairy Milk. Soy milk. Cheese that is naturally low in sodium, such as ricotta cheese, fresh mozzarella, or Swiss cheese Low-sodium or reduced-sodium cheese.  Cream cheese. Yogurt. Fats and oils Unsalted butter. Unsalted margarine with no trans fat. Vegetable oils such as canola or olive oils. Seasonings and other foods Fresh and dried herbs and spices. Salt-free seasonings. Low-sodium mustard and  ketchup. Sodium-free salad dressing. Sodium-free light mayonnaise. Fresh or refrigerated horseradish. Lemon juice. Vinegar. Homemade, reduced-sodium, or low-sodium soups. Unsalted popcorn and pretzels. Low-salt or salt-free chips. What foods are not recommended? The items listed may not be a complete list. Talk with your dietitian about what dietary choices are best for you. Grains Instant hot cereals. Bread stuffing, pancake, and biscuit mixes. Croutons. Seasoned rice or pasta mixes. Noodle soup cups. Boxed or frozen macaroni and cheese. Regular salted crackers. Self-rising flour. Vegetables Sauerkraut, pickled vegetables, and relishes. Olives. [place] fries. Onion rings. Regular canned vegetables (not low-sodium or reduced-sodium). Regular canned tomato sauce and paste (not low-sodium or reduced-sodium). Regular tomato and vegetable juice (not low-sodium or reduced-sodium). Frozen vegetables in sauces. Meats and other protein foods Meat or fish that is salted, canned, smoked, spiced, or pickled. Bacon, ham, sausage, hotdogs, corned beef, chipped beef, packaged lunch meats, salt pork, jerky, pickled herring, anchovies, regular canned tuna, sardines, salted nuts. Dairy Processed cheese and cheese spreads. Cheese curds. Blue cheese. Feta cheese. String cheese. Regular cottage cheese. Buttermilk. Canned milk. Fats and oils Salted butter. Regular margarine. Ghee. Bacon fat. Seasonings and other foods Onion salt, garlic salt, seasoned salt, table salt, and sea salt. Canned and packaged gravies. Worcestershire sauce. Tartar sauce. Barbecue sauce. Teriyaki sauce. Soy sauce, including reduced-sodium. Steak sauce. Fish sauce. Oyster sauce. Cocktail sauce. Horseradish that you find on the shelf. Regular ketchup and mustard. Meat flavorings and tenderizers. Bouillon cubes. Hot sauce and Tabasco sauce. Premade or packaged marinades. Premade or packaged taco seasonings. Relishes. Regular salad dressings. Salsa.  Potato and tortilla chips. Corn chips and puffs. Salted popcorn and pretzels. Canned or dried soups. Pizza. Frozen entrees and pot pies. Summary  Eating less sodium can help lower your blood pressure, reduce swelling, and protect your heart, liver, and kidneys.  Most people on this plan should limit their sodium intake to 1,500-2,000 mg (milligrams) of sodium each day.  Canned, boxed, and frozen foods are high in sodium. Restaurant foods, fast foods, and pizza are also very high in sodium. You also get sodium by adding salt to food.  Try to cook at home, eat more fresh fruits and vegetables, and eat less fast food, canned, processed, or prepared foods. This information is not intended to replace advice given to you by your health care provider. Make sure you discuss any questions you have with your health care provider. Document Released: 04/15/2002 Document Revised: 10/17/2016 Document Reviewed: 10/17/2016 Elsevier Interactive Patient Education  2017 [employer].

## 2017-08-29 ENCOUNTER — Telehealth: Payer: Self-pay | Admitting: Physician Assistant

## 2017-08-29 NOTE — Telephone Encounter (Signed)
Question addressed regarding resuming EOD alternating doses of lasix, pt verbalized understanding and thanks.

## 2017-08-29 NOTE — Telephone Encounter (Signed)
Line busy when dialed. 

## 2017-08-29 NOTE — Telephone Encounter (Signed)
Please call,pt says he needs to talk to you again.

## 2017-08-29 NOTE — Telephone Encounter (Signed)
Patient was seen by PA on 08/28/17

## 2017-08-30 ENCOUNTER — Telehealth: Payer: Self-pay | Admitting: Cardiology

## 2017-08-30 ENCOUNTER — Encounter: Payer: Medicare Other | Admitting: *Deleted

## 2017-08-30 ENCOUNTER — Encounter: Payer: Medicare Other | Admitting: Endocrinology

## 2017-08-30 NOTE — Telephone Encounter (Signed)
LMOVM reminding pt to send remote transmission.   

## 2017-08-31 ENCOUNTER — Encounter: Payer: Self-pay | Admitting: Cardiology

## 2017-08-31 ENCOUNTER — Telehealth: Payer: Self-pay | Admitting: Endocrinology

## 2017-08-31 NOTE — Telephone Encounter (Signed)
Was normal last week Please come in for bp check here soon.

## 2017-08-31 NOTE — Telephone Encounter (Signed)
[facility] Ms Kathlen Mody is checking the pts BP and it is 180/70, she is asking if he needs to be seen, pt does feel fine and is having no symptoms.

## 2017-09-01 NOTE — Telephone Encounter (Signed)
Called and left VM for patient to call back for nurse visit appt to check BP.

## 2017-09-11 ENCOUNTER — Encounter: Payer: Self-pay | Admitting: Cardiology

## 2017-09-11 ENCOUNTER — Ambulatory Visit: Payer: Medicare Other | Admitting: Cardiology

## 2017-09-11 DIAGNOSIS — Z9861 Coronary angioplasty status: Secondary | ICD-10-CM

## 2017-09-11 DIAGNOSIS — I482 Chronic atrial fibrillation, unspecified: Secondary | ICD-10-CM

## 2017-09-11 DIAGNOSIS — I5032 Chronic diastolic (congestive) heart failure: Secondary | ICD-10-CM | POA: Diagnosis not present

## 2017-09-11 DIAGNOSIS — I2510 Atherosclerotic heart disease of native coronary artery without angina pectoris: Secondary | ICD-10-CM

## 2017-09-11 DIAGNOSIS — I5033 Acute on chronic diastolic (congestive) heart failure: Secondary | ICD-10-CM

## 2017-09-11 NOTE — Assessment & Plan Note (Signed)
EF 60% by echo Aug 2018

## 2017-09-11 NOTE — Assessment & Plan Note (Signed)
Pt appears compensated- wgt 152 lbs

## 2017-09-11 NOTE — Assessment & Plan Note (Signed)
Not on anticoagulation secondary to history of GI bleeding and anemia

## 2017-09-11 NOTE — Assessment & Plan Note (Signed)
Xience DES 3.0 mm x 8 mm prox LAD May 2015

## 2017-09-11 NOTE — Progress Notes (Signed)
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 The pt had MVR with CABG x 1 at [place] in 2009. He had an LAD DES in May 2015 followed by TAVR in June 2015. He has persistent atrial fibrillation and SSS. He is s/p MDT pacemaker and is pacer dependant. He is not anticoagulated secondary to a history of GI bleeding. He had Amiodarone induced hyperthyroidism. He has had some problems with diastolic CHF. His last EF was 60% by echo Aug 2018 with normal prosthetic valve gradients.  He saw Suanne Marker 08/28/17 with some increased dyspnea and mild volume overload. Low dose beta blocker was added and he was told to increase his Lasix for a few doses. He is seeing me in follow up. He says he feels better. His wgt is 152 lbs, down 5 lbs. He has problems with urinary frequency with Lasix and doesn't like to take it and asked me today "how long will I be on Lasix". I explained that he'll probably need daily Lasix from now on. I told him he could try and decrease the dose to 40 mg daily but to go back 40 mg alternating with 80 mg every other day if his wgt gets up to 155 lbs.       Current Outpatient Medications  Medication Sig Dispense Refill  . clopidogrel (PLAVIX) 75 MG tablet Take 1 tablet (75 mg total) by mouth daily with breakfast. 90 tablet 3  . furosemide (LASIX) 40 MG tablet Pt takes one tablet on Monday, Wednesday, and Friday.  Pt takes two tablets on Sunday, Tuesday, Thursday, and Saturday. 30 tablet   . metoprolol succinate (TOPROL XL) 25 MG 24 hr tablet Take 1 tablet (25 mg total) by mouth daily. 30 tablet 3  . Multiple Vitamin (MULTIVITAMIN WITH MINERALS) TABS Take 1 tablet by mouth daily. Centrum Silver    . pantoprazole (PROTONIX) 40 MG tablet Take 1 tablet (40 mg total)  by mouth daily. 90 tablet 2  . potassium chloride SA (K-DUR,KLOR-CON) 20 MEQ tablet Take 20 mEq by mouth daily.    . simvastatin (ZOCOR) 40 MG tablet Take 1 tablet (40 mg total) by mouth at bedtime. 90 tablet 1  . Tamsulosin HCl (FLOMAX) 0.4 MG CAPS Take 0.4 mg by mouth at bedtime.     . triamcinolone (KENALOG) 0.025 % cream Apply 1 application topically 3 (three) times daily as needed. For rash 80 g 3   No current facility-administered medications for this visit.     No Known Allergies  Past Medical History:  Diagnosis Date  . ANEMIA, IRON DEFICIENCY 10/13/2008  . ANXIETY 09/04/2008  . Aortic valvular disorder 08/16/2012   echo EF 45-50% LV mildly reduce,Biocor mitral valve,mild to mod. tricuspid regrug.,mild to mod. aortic regrug.  . Aortic valvular stenosis 11/19/2013  . ATRIAL FIBRILLATION, CHRONIC 01/14/2008   on warfarin since 2007  . BARRETTS ESOPHAGUS 03/20/2009  . BASAL CELL CARCINOMA OF SKIN SITE UNSPECIFIED 09/28/2010  . Cardiomyopathy, ischemic 11/19/2013   EF 45-50% with inferior wall hypokinesis by echo 2014  . CATARACTS, BILATERAL, HX OF 01/14/2008  . CEREBROVASCULAR ACCIDENT, HX OF 08/18/2008  . CHF (congestive heart failure) ([place])   . COAGULOPATHY, COUMADIN-INDUCED 01/14/2008  . COLONIC POLYPS, ADENOMATOUS 01/14/2008  . CORONARY ARTERY DISEASE 06/01/2007  Cath 04/03/2008; Cath 11/01/1990  . Dyspnea    on exertion started couple months ago  . Dysrhythmia    HX OF [place]  . Edema, peripheral 07/30/2009   LEV doppler- no thrombus or thrombophlebitis  . Elevated PSA   . GASTRIC POLYP 05/13/2009  . GERD 06/01/2007  . History of hiatal hernia   . Hyperglycemia   . HYPERLIPIDEMIA 06/01/2007  . HYPERTENSION 06/01/2007  . HYPERTHYROIDISM 01/06/2011  . Mitral regurgitation 06/13/2008   mitral valve replacement #20 St. Jude Biocor; Maze procedure by Dr. Amador Cunas  at [facility]  . Mitral stenosis    Porcine bioprosthetic tissue valve placed 06/13/2008  . Mitral valve disorder  09/08/2011   echo EF 50-55% LV normal  . Pacemaker   . Peripheral artery disease ([place]) 06/16/2010   LEA doppler-left ABI nml 0.61 calcified vessels;right ABI  0.68  . PERSONAL HX COLONIC POLYPS 01/01/2010  . PLEURAL EFFUSION, LEFT 10/13/2008  . Prostate cancer [facility])    s/p radiation  . PROSTATE CANCER, HX OF 01/14/2008  . Prosthetic valve dysfunction    Mitral stenosis involving bioprosthetic tissue valve placed 06/13/2008  . Restless leg syndrome   . S/P CABG x 1 06/13/2008   SVG to LAD with open vein harvest right thigh, LIMA harvested but not utilized by Dr Amador Cunas  . S/P Maze operation for atrial fibrillation 06/13/2008   Partial left atrial lesion set using cryothermy for bilateral pulmonary vein isolation by Dr Amador Cunas  . S/P mitral valve replacement with bioprosthetic valve 06/13/2008   76mm St Jude Biocor porcine bioprosthetic tissue valve by Dr Amador Cunas  . 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 Tachy-brady syndrome ([place]) 07/13/2008   medtronic Adapta gen. model ADDR01 ,serial # [id number] H by Dr  [name] at [facility]  . UNSPECIFIED VENOUS INSUFFICIENCY 09/18/2008    Social History   Socioeconomic History  . Marital status: Married    Spouse name: Not on file  . Number of children: 4  . Years of education: Not on file  . Highest education level: Not on file  Social Needs  . Financial resource strain: Not on file  . Food insecurity - worry: Not on file  . Food insecurity - inability: Not on file  . Transportation needs - medical: Not on file  . Transportation needs - non-medical: Not on file  Occupational History  . Occupation: Retired    Fish farm manager: RETIRED  Tobacco Use  . Smoking status: Former Smoker    Years: 2.00    Types: Pipe    Last attempt to quit: 11/07/1965    Years since quitting: 51.8  . Smokeless tobacco: Never Used  . Tobacco comment:  quit over 40 years ago, never smoked cigarettes  Substance and Sexual Activity  . Alcohol use: No  . Drug use: No  . Sexual activity: Not on file  Other Topics Concern  . Not on file  Social History Narrative  . Not on file     Family History  Problem Relation Age of Onset  . Breast cancer Mother   . Stroke Mother   . Transient ischemic attack Mother      Review of Systems: General: negative for chills, fever, night sweats or weight changes.  Cardiovascular: negative for chest pain, dyspnea on exertion, edema, orthopnea, palpitations, paroxysmal nocturnal dyspnea or shortness of breath Dermatological: negative for rash Respiratory: negative for  cough or wheezing Urologic: negative for hematuria Abdominal: negative for nausea, vomiting, diarrhea, bright red blood per rectum, melena, or hematemesis Neurologic: negative for visual changes, syncope, or dizziness All other systems reviewed and are otherwise negative except as noted above.    Blood pressure (!) 152/88, pulse 95, height 5\' 9"  (1.753 m), weight 152 lb 12.8 oz (69.3 kg), SpO2 92 %.  General appearance: alert, cooperative and no distress Neck: no JVD Lungs: decreased breath sounds, severe kyphosis Heart: irregularly irregular rhythm Extremities: trace edema Skin: Skin color, texture, turgor normal. No rashes or lesions Neurologic: Grossly normal   ASSESSMENT AND PLAN:   Acute on chronic diastolic CHF (congestive heart failure) (HCC) Pt appears compensated- wgt 152 lbs  Atrial fibrillation (HCC) Not on anticoagulation secondary to history of GI bleeding and anemia  CAD S/P percutaneous coronary angioplasty -  Xience DES 3.0 mm x 8 mm prox LAD May 7035  Chronic diastolic heart failure (HCC) EF 60% by echo Aug 2018  Mitral stenosis Porcine bioprosthetic tissue valve placed 06/13/2008  Pacemaker - Medtronic Adapta, dual chamber Pacemaker dependent , 100% pacing in the atrium and ventricle  S/P CABG x  1 SVG to LAD with open vein harvest via right thigh, LIMA harvested but not utilized by Dr Amador Cunas  S/P Maze operation for atrial fibrillation Partial left atrial lesion set using cryothermy for bilateral pulmonary vein isolation by Dr Amador Cunas  S/P TAVR (transcatheter aortic valve replacement) 29 mm Edwards Sapien XT transcatheter heart valve placed via open right transfemoral approach     PLAN  Same Rx- pt doesn't like to take Lasix as it interferes with activity and he tries to stay busy. Will try and keep his wgt around [place] PA-C 09/11/2017 11:04 AM

## 2017-09-11 NOTE — Patient Instructions (Addendum)
Medication Instructions:  Your physician recommends that you continue on your current medications as directed. Please refer to the Current Medication list given to you today.   Labwork: None ordered  Testing/Procedures: None ordered  Follow-Up: Your physician recommends that you schedule a follow-up appointment in: 2 [place]   Any Other Special Instructions Will Be Listed Below (If Applicable).     If you need a refill on your cardiac medications before your next appointment, please call your pharmacy.

## 2017-10-15 ENCOUNTER — Encounter (HOSPITAL_COMMUNITY): Payer: Self-pay | Admitting: Emergency Medicine

## 2017-10-15 ENCOUNTER — Inpatient Hospital Stay (HOSPITAL_COMMUNITY)
Admission: EM | Admit: 2017-10-15 | Discharge: 2017-10-19 | DRG: 291 | Disposition: A | Discharge status: Skilled Nursing Facility | Payer: Medicare Other | Attending: Cardiovascular Disease | Admitting: Cardiovascular Disease

## 2017-10-15 ENCOUNTER — Emergency Department (HOSPITAL_COMMUNITY): Payer: Medicare Other

## 2017-10-15 ENCOUNTER — Other Ambulatory Visit: Payer: Self-pay

## 2017-10-15 DIAGNOSIS — I5033 Acute on chronic diastolic (congestive) heart failure: Secondary | ICD-10-CM | POA: Diagnosis not present

## 2017-10-15 DIAGNOSIS — Z66 Do not resuscitate: Secondary | ICD-10-CM | POA: Diagnosis present

## 2017-10-15 DIAGNOSIS — Z85828 Personal history of other malignant neoplasm of skin: Secondary | ICD-10-CM | POA: Diagnosis not present

## 2017-10-15 DIAGNOSIS — Z8679 Personal history of other diseases of the circulatory system: Secondary | ICD-10-CM | POA: Diagnosis not present

## 2017-10-15 DIAGNOSIS — Z7902 Long term (current) use of antithrombotics/antiplatelets: Secondary | ICD-10-CM

## 2017-10-15 DIAGNOSIS — I361 Nonrheumatic tricuspid (valve) insufficiency: Secondary | ICD-10-CM | POA: Diagnosis not present

## 2017-10-15 DIAGNOSIS — Z950 Presence of cardiac pacemaker: Secondary | ICD-10-CM | POA: Diagnosis not present

## 2017-10-15 DIAGNOSIS — Z9889 Other specified postprocedural states: Secondary | ICD-10-CM

## 2017-10-15 DIAGNOSIS — Z87891 Personal history of nicotine dependence: Secondary | ICD-10-CM

## 2017-10-15 DIAGNOSIS — K219 Gastro-esophageal reflux disease without esophagitis: Secondary | ICD-10-CM | POA: Diagnosis present

## 2017-10-15 DIAGNOSIS — I4821 Permanent atrial fibrillation: Secondary | ICD-10-CM | POA: Diagnosis present

## 2017-10-15 DIAGNOSIS — J90 Pleural effusion, not elsewhere classified: Secondary | ICD-10-CM | POA: Diagnosis present

## 2017-10-15 DIAGNOSIS — Z953 Presence of xenogenic heart valve: Secondary | ICD-10-CM

## 2017-10-15 DIAGNOSIS — I110 Hypertensive heart disease with heart failure: Principal | ICD-10-CM | POA: Diagnosis present

## 2017-10-15 DIAGNOSIS — I509 Heart failure, unspecified: Secondary | ICD-10-CM

## 2017-10-15 DIAGNOSIS — Z952 Presence of prosthetic heart valve: Secondary | ICD-10-CM | POA: Diagnosis not present

## 2017-10-15 DIAGNOSIS — I739 Peripheral vascular disease, unspecified: Secondary | ICD-10-CM | POA: Diagnosis present

## 2017-10-15 DIAGNOSIS — E0590 Thyrotoxicosis, unspecified without thyrotoxic crisis or storm: Secondary | ICD-10-CM | POA: Diagnosis present

## 2017-10-15 DIAGNOSIS — Z6821 Body mass index (BMI) 21.0-21.9, adult: Secondary | ICD-10-CM | POA: Diagnosis not present

## 2017-10-15 DIAGNOSIS — Z8673 Personal history of transient ischemic attack (TIA), and cerebral infarction without residual deficits: Secondary | ICD-10-CM | POA: Diagnosis not present

## 2017-10-15 DIAGNOSIS — I25708 Atherosclerosis of coronary artery bypass graft(s), unspecified, with other forms of angina pectoris: Secondary | ICD-10-CM

## 2017-10-15 DIAGNOSIS — I10 Essential (primary) hypertension: Secondary | ICD-10-CM | POA: Diagnosis present

## 2017-10-15 DIAGNOSIS — R251 Tremor, unspecified: Secondary | ICD-10-CM | POA: Diagnosis present

## 2017-10-15 DIAGNOSIS — Z955 Presence of coronary angioplasty implant and graft: Secondary | ICD-10-CM

## 2017-10-15 DIAGNOSIS — I272 Pulmonary hypertension, unspecified: Secondary | ICD-10-CM

## 2017-10-15 DIAGNOSIS — R0902 Hypoxemia: Secondary | ICD-10-CM

## 2017-10-15 DIAGNOSIS — Z923 Personal history of irradiation: Secondary | ICD-10-CM

## 2017-10-15 DIAGNOSIS — Z8546 Personal history of malignant neoplasm of prostate: Secondary | ICD-10-CM

## 2017-10-15 DIAGNOSIS — E0580 Other thyrotoxicosis without thyrotoxic crisis or storm: Secondary | ICD-10-CM | POA: Diagnosis present

## 2017-10-15 DIAGNOSIS — Z8719 Personal history of other diseases of the digestive system: Secondary | ICD-10-CM | POA: Diagnosis not present

## 2017-10-15 DIAGNOSIS — I482 Chronic atrial fibrillation: Secondary | ICD-10-CM | POA: Diagnosis not present

## 2017-10-15 DIAGNOSIS — R778 Other specified abnormalities of plasma proteins: Secondary | ICD-10-CM | POA: Diagnosis present

## 2017-10-15 DIAGNOSIS — Z7982 Long term (current) use of aspirin: Secondary | ICD-10-CM

## 2017-10-15 DIAGNOSIS — E43 Unspecified severe protein-calorie malnutrition: Secondary | ICD-10-CM | POA: Diagnosis present

## 2017-10-15 DIAGNOSIS — E785 Hyperlipidemia, unspecified: Secondary | ICD-10-CM | POA: Diagnosis present

## 2017-10-15 DIAGNOSIS — R0602 Shortness of breath: Secondary | ICD-10-CM

## 2017-10-15 DIAGNOSIS — R7989 Other specified abnormal findings of blood chemistry: Secondary | ICD-10-CM | POA: Diagnosis present

## 2017-10-15 DIAGNOSIS — Z951 Presence of aortocoronary bypass graft: Secondary | ICD-10-CM | POA: Diagnosis not present

## 2017-10-15 DIAGNOSIS — G2581 Restless legs syndrome: Secondary | ICD-10-CM | POA: Diagnosis present

## 2017-10-15 DIAGNOSIS — I248 Other forms of acute ischemic heart disease: Secondary | ICD-10-CM

## 2017-10-15 DIAGNOSIS — E873 Alkalosis: Secondary | ICD-10-CM | POA: Diagnosis not present

## 2017-10-15 DIAGNOSIS — T462X5A Adverse effect of other antidysrhythmic drugs, initial encounter: Secondary | ICD-10-CM | POA: Diagnosis present

## 2017-10-15 DIAGNOSIS — I2510 Atherosclerotic heart disease of native coronary artery without angina pectoris: Secondary | ICD-10-CM | POA: Diagnosis not present

## 2017-10-15 DIAGNOSIS — Z888 Allergy status to other drugs, medicaments and biological substances status: Secondary | ICD-10-CM

## 2017-10-15 DIAGNOSIS — R748 Abnormal levels of other serum enzymes: Secondary | ICD-10-CM | POA: Diagnosis not present

## 2017-10-15 DIAGNOSIS — I214 Non-ST elevation (NSTEMI) myocardial infarction: Secondary | ICD-10-CM

## 2017-10-15 DIAGNOSIS — I371 Nonrheumatic pulmonary valve insufficiency: Secondary | ICD-10-CM | POA: Diagnosis not present

## 2017-10-15 HISTORY — DX: Rheumatic mitral valve disease, unspecified: I05.9

## 2017-10-15 HISTORY — DX: Repeated falls: R29.6

## 2017-10-15 HISTORY — DX: Unspecified fall, initial encounter: W19.XXXA

## 2017-10-15 HISTORY — DX: Thyrotoxicosis, unspecified without thyrotoxic crisis or storm: E05.90

## 2017-10-15 HISTORY — DX: Pulmonary hypertension, unspecified: I27.20

## 2017-10-15 HISTORY — DX: Chronic diastolic (congestive) heart failure: I50.32

## 2017-10-15 HISTORY — DX: Permanent atrial fibrillation: I48.21

## 2017-10-15 LAB — COMPREHENSIVE METABOLIC PANEL
ALBUMIN: 3.2 g/dL — AB (ref 3.5–5.0)
ALK PHOS: 75 U/L (ref 38–126)
ALT: 16 U/L — ABNORMAL LOW (ref 17–63)
ANION GAP: 5 (ref 5–15)
AST: 25 U/L (ref 15–41)
BILIRUBIN TOTAL: 0.6 mg/dL (ref 0.3–1.2)
BUN: 19 mg/dL (ref 6–20)
CALCIUM: 8.6 mg/dL — AB (ref 8.9–10.3)
CO2: 36 mmol/L — ABNORMAL HIGH (ref 22–32)
Chloride: 104 mmol/L (ref 101–111)
Creatinine, Ser: 0.9 mg/dL (ref 0.61–1.24)
GFR calc Af Amer: >60 mL/min (ref >60)
GLUCOSE: 96 mg/dL (ref 65–99)
POTASSIUM: 3.9 mmol/L (ref 3.5–5.1)
Sodium: 145 mmol/L (ref 135–145)
TOTAL PROTEIN: 5.9 g/dL — AB (ref 6.5–8.1)

## 2017-10-15 LAB — CBC WITH DIFFERENTIAL/PLATELET
BASOS PCT: 0 %
Basophils Absolute: 0 10*3/uL (ref 0.0–0.1)
Eosinophils Absolute: 0 10*3/uL (ref 0.0–0.7)
Eosinophils Relative: 1 %
HCT: 35.2 % — ABNORMAL LOW (ref 39.0–52.0)
HEMOGLOBIN: 10.6 g/dL — AB (ref 13.0–17.0)
LYMPHS PCT: 10 %
Lymphs Abs: 0.5 10*3/uL — ABNORMAL LOW (ref 0.7–4.0)
MCH: 27.4 pg (ref 26.0–34.0)
MCHC: 30.1 g/dL (ref 30.0–36.0)
MCV: 91 fL (ref 78.0–100.0)
MONO ABS: 0.6 10*3/uL (ref 0.1–1.0)
MONOS PCT: 12 %
NEUTROS ABS: 3.7 10*3/uL (ref 1.7–7.7)
NEUTROS PCT: 77 %
Platelets: 149 10*3/uL — ABNORMAL LOW (ref 150–400)
RBC: 3.87 MIL/uL — ABNORMAL LOW (ref 4.22–5.81)
RDW: 15.4 % (ref 11.5–15.5)
WBC: 4.9 10*3/uL (ref 4.0–10.5)

## 2017-10-15 LAB — I-STAT VENOUS BLOOD GAS, ED
Acid-Base Excess: 10 mmol/L — ABNORMAL HIGH (ref 0.0–2.0)
Bicarbonate: 38.2 mmol/L — ABNORMAL HIGH (ref 20.0–28.0)
O2 Saturation: 36 %
TCO2: 40 mmol/L — ABNORMAL HIGH (ref 22–32)
pCO2, Ven: 69.6 mmHg — ABNORMAL HIGH (ref 44.0–60.0)
pH, Ven: 7.347 (ref 7.250–7.430)
pO2, Ven: 24 mmHg — CL (ref 32.0–45.0)

## 2017-10-15 LAB — I-STAT TROPONIN, ED: Troponin i, poc: 0.7 ng/mL (ref 0.00–0.08)

## 2017-10-15 LAB — I-STAT CHEM 8, ED
BUN: 24 mg/dL — ABNORMAL HIGH (ref 6–20)
Calcium, Ion: 1.13 mmol/L — ABNORMAL LOW (ref 1.15–1.40)
Chloride: 101 mmol/L (ref 101–111)
Creatinine, Ser: 0.9 mg/dL (ref 0.61–1.24)
Glucose, Bld: 88 mg/dL (ref 65–99)
HCT: 33 % — ABNORMAL LOW (ref 39.0–52.0)
Hemoglobin: 11.2 g/dL — ABNORMAL LOW (ref 13.0–17.0)
Potassium: 4 mmol/L (ref 3.5–5.1)
Sodium: 147 mmol/L — ABNORMAL HIGH (ref 135–145)
TCO2: 39 mmol/L — ABNORMAL HIGH (ref 22–32)

## 2017-10-15 LAB — MRSA PCR SCREENING: MRSA BY PCR: NEGATIVE

## 2017-10-15 LAB — BRAIN NATRIURETIC PEPTIDE: B Natriuretic Peptide: 635.3 pg/mL — ABNORMAL HIGH (ref 0.0–100.0)

## 2017-10-15 MED ORDER — ALBUTEROL SULFATE (2.5 MG/3ML) 0.083% IN NEBU
2.5000 mg | INHALATION_SOLUTION | Freq: Four times a day (QID) | RESPIRATORY_TRACT | Status: DC | PRN
  Administered 2017-10-15: 2.5 mg via RESPIRATORY_TRACT
  Filled 2017-10-15: qty 3

## 2017-10-15 MED ORDER — FUROSEMIDE 10 MG/ML IJ SOLN
40.0000 mg | Freq: Once | INTRAMUSCULAR | Status: AC
  Administered 2017-10-15: 40 mg via INTRAVENOUS
  Filled 2017-10-15: qty 4

## 2017-10-15 MED ORDER — ASPIRIN 325 MG PO TABS
325.0000 mg | ORAL_TABLET | Freq: Once | ORAL | Status: AC
  Administered 2017-10-15: 325 mg via ORAL
  Filled 2017-10-15: qty 1

## 2017-10-15 MED ORDER — HEPARIN SODIUM (PORCINE) 5000 UNIT/ML IJ SOLN
5000.0000 [IU] | Freq: Three times a day (TID) | INTRAMUSCULAR | Status: DC
  Administered 2017-10-15 – 2017-10-17 (×6): 5000 [IU] via SUBCUTANEOUS
  Filled 2017-10-15 (×6): qty 1

## 2017-10-15 MED ORDER — PANTOPRAZOLE SODIUM 40 MG PO TBEC
40.0000 mg | DELAYED_RELEASE_TABLET | Freq: Every day | ORAL | Status: DC
  Administered 2017-10-15 – 2017-10-19 (×5): 40 mg via ORAL
  Filled 2017-10-15 (×5): qty 1

## 2017-10-15 MED ORDER — CLOPIDOGREL BISULFATE 75 MG PO TABS
75.0000 mg | ORAL_TABLET | Freq: Every day | ORAL | Status: DC
  Administered 2017-10-16 – 2017-10-19 (×4): 75 mg via ORAL
  Filled 2017-10-15 (×4): qty 1

## 2017-10-15 MED ORDER — ONDANSETRON HCL 4 MG/2ML IJ SOLN: 4.0000 mg | Freq: Four times a day (QID) | INTRAMUSCULAR | Status: DC | PRN

## 2017-10-15 MED ORDER — SODIUM CHLORIDE 0.9 % IV SOLN: 250.0000 mL | INTRAVENOUS | Status: DC | PRN

## 2017-10-15 MED ORDER — ADULT MULTIVITAMIN W/MINERALS CH
1.0000 | ORAL_TABLET | Freq: Every day | ORAL | Status: DC
  Administered 2017-10-15 – 2017-10-19 (×5): 1 via ORAL
  Filled 2017-10-15 (×5): qty 1

## 2017-10-15 MED ORDER — POTASSIUM CHLORIDE CRYS ER 20 MEQ PO TBCR
20.0000 meq | EXTENDED_RELEASE_TABLET | Freq: Every day | ORAL | Status: DC
  Administered 2017-10-15 – 2017-10-19 (×5): 20 meq via ORAL
  Filled 2017-10-15 (×5): qty 1

## 2017-10-15 MED ORDER — ACETAMINOPHEN 325 MG PO TABS: 650.0000 mg | ORAL_TABLET | ORAL | Status: DC | PRN

## 2017-10-15 MED ORDER — METOPROLOL SUCCINATE ER 25 MG PO TB24
25.0000 mg | ORAL_TABLET | Freq: Every day | ORAL | Status: DC
  Administered 2017-10-15 – 2017-10-19 (×5): 25 mg via ORAL
  Filled 2017-10-15 (×5): qty 1

## 2017-10-15 MED ORDER — NITROGLYCERIN 0.4 MG SL SUBL: 0.4000 mg | SUBLINGUAL_TABLET | SUBLINGUAL | Status: DC | PRN

## 2017-10-15 MED ORDER — TAMSULOSIN HCL 0.4 MG PO CAPS
0.4000 mg | ORAL_CAPSULE | Freq: Every day | ORAL | Status: DC
  Administered 2017-10-15 – 2017-10-18 (×4): 0.4 mg via ORAL
  Filled 2017-10-15 (×4): qty 1

## 2017-10-15 MED ORDER — SIMVASTATIN 40 MG PO TABS
40.0000 mg | ORAL_TABLET | Freq: Every day | ORAL | Status: DC
  Administered 2017-10-15 – 2017-10-18 (×4): 40 mg via ORAL
  Filled 2017-10-15 (×4): qty 1

## 2017-10-15 NOTE — ED Notes (Signed)
Dr.Yao shown results of VBG. ED-lab

## 2017-10-15 NOTE — ED Notes (Signed)
Dr.Yao informed of Istat Troponin.ED-Lab

## 2017-10-15 NOTE — ED Provider Notes (Signed)
[facility] EMERGENCY DEPARTMENT Provider Note   CSN: [id number] Arrival date & time: 10/15/17  0911     History   Chief Complaint Chief Complaint  Patient presents with  . Shortness of Breath    HPI [name] [name] is a 83 y.o. male hx of afib not on anticoagulation due to GI bleed, CAD s/p stent, CHF, here with shortness of breath. Patient is currently in independent living. He has chronic shortness of breath and is on 2 L [place] at baseline. Last night, he woke up with severe shortness of breath and orthopnea. He didn't take his lasix this morning but usually takes his medicines. Denies any chest pain. States that his weight is stable and has chronic leg swelling. Follows up with [facility] for cardiology.   The history is provided by the patient.    Past Medical History:  Diagnosis Date  . ANEMIA, IRON DEFICIENCY 10/13/2008  . ANXIETY 09/04/2008  . Aortic valvular disorder 08/16/2012   echo EF 45-50% LV mildly reduce,Biocor mitral valve,mild to mod. tricuspid regrug.,mild to mod. aortic regrug.  . Aortic valvular stenosis 11/19/2013  . ATRIAL FIBRILLATION, CHRONIC 01/14/2008   on warfarin since 2007  . BARRETTS ESOPHAGUS 03/20/2009  . BASAL CELL CARCINOMA OF SKIN SITE UNSPECIFIED 09/28/2010  . Cardiomyopathy, ischemic 11/19/2013   EF 45-50% with inferior wall hypokinesis by echo 2014  . CATARACTS, BILATERAL, HX OF 01/14/2008  . CEREBROVASCULAR ACCIDENT, HX OF 08/18/2008  . CHF (congestive heart failure) ([place])   . COAGULOPATHY, COUMADIN-INDUCED 01/14/2008  . COLONIC POLYPS, ADENOMATOUS 01/14/2008  . CORONARY ARTERY DISEASE 06/01/2007   Cath 04/03/2008; Cath 11/01/1990  . Dyspnea    on exertion started couple months ago  . Dysrhythmia    HX OF [place]Y  . Edema, peripheral 07/30/2009   LEV doppler- no thrombus or thrombophlebitis  . Elevated PSA   . GASTRIC POLYP 05/13/2009  . GERD 06/01/2007  . History of hiatal hernia   . Hyperglycemia   . HYPERLIPIDEMIA 06/01/2007    . HYPERTENSION 06/01/2007  . HYPERTHYROIDISM 01/06/2011  . Mitral regurgitation 06/13/2008   mitral valve replacement #20 St. Jude Biocor; Maze procedure by Dr. Amador Cunas  at [facility]  . Mitral stenosis    Porcine bioprosthetic tissue valve placed 06/13/2008  . Mitral valve disorder 09/08/2011   echo EF 50-55% LV normal  . Pacemaker   . Peripheral artery disease ([place]) 06/16/2010   LEA doppler-left ABI nml 0.61 calcified vessels;right ABI  0.68  . PERSONAL HX COLONIC POLYPS 01/01/2010  . PLEURAL EFFUSION, LEFT 10/13/2008  . Prostate cancer [facility])    s/p radiation  . PROSTATE CANCER, HX OF 01/14/2008  . Prosthetic valve dysfunction    Mitral stenosis involving bioprosthetic tissue valve placed 06/13/2008  . Restless leg syndrome   . S/P CABG x 1 06/13/2008   SVG to LAD with open vein harvest right thigh, LIMA harvested but not utilized by Dr Amador Cunas  . S/P Maze operation for atrial fibrillation 06/13/2008   Partial left atrial lesion set using cryothermy for bilateral pulmonary vein isolation by Dr Amador Cunas  . S/P mitral valve replacement with bioprosthetic valve 06/13/2008   61mm St Jude Biocor porcine bioprosthetic tissue valve by Dr Amador Cunas  . 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 Tachy-brady syndrome ([place]) 07/13/2008   medtronic Adapta gen. model ADDR01 ,serial # [id number] H by Dr  Vincent Peyer  Williams at [facility]  . UNSPECIFIED VENOUS INSUFFICIENCY 09/18/2008    Patient Active Problem List   Diagnosis Date Noted  . Osteopenia 08/22/2017  . Acute on chronic diastolic heart failure ([place]) 06/25/2017  . Overflow incontinence 06/25/2017  . Vocal fold paralysis, left 06/07/2017  . Acute diastolic CHF (congestive heart failure) ([place]) 04/25/2017  . Hypocalcemia 12/29/2016  . Pulmonary vascular congestion   . CAP (community acquired pneumonia) 12/17/2016  . Elevated troponin 12/17/2016  .  CHF (congestive heart failure) ([place]) 12/17/2016  . Pressure ulcer of sacral region, stage 1 09/23/2016  . Acute on chronic diastolic CHF (congestive heart failure) ([place]) 09/21/2016  . Permanent atrial fibrillation ([place]) 09/01/2016  . Chronic diastolic heart failure ([place]) 03/28/2016  . Diaphragmatic hernia 03/28/2016  . 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 Risk for falls 03/28/2016  . Paresthesia of left lower extremity 11/11/2015  . Hoarse 12/23/2014  . GI bleed 05/17/2014  . Rectal bleed 05/17/2014  . Hypotension 05/17/2014  . 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 Dizziness 04/05/2014  . SSS (sick sinus syndrome) ([place]) 03/25/2014  . CAD S/P percutaneous coronary angioplasty -  03/24/2014  . Perivalvular leak of prosthetic heart valve   . Mitral stenosis   . Routine general medical examination at a health care facility 12/31/2013  . Solitary pulmonary nodule 12/08/2013  . Pleural effusion 12/05/2013  . Dyspnea 12/05/2013  . Pacemaker - Medtronic Adapta, dual chamber 11/19/2013  . Restrictive lung disease 03/30/2013  . Encounter for long-term (current) use of other medications 08/17/2012  . BASAL CELL CARCINOMA OF SKIN SITE UNSPECIFIED 09/28/2010  . PERSONAL HX COLONIC POLYPS 01/01/2010  . BARRETTS ESOPHAGUS 03/20/2009  . Hematuria 10/21/2008  . Iron deficiency anemia 10/13/2008  . Anemia 09/18/2008  . UNSPECIFIED VENOUS INSUFFICIENCY 09/18/2008  . ANXIETY 09/04/2008  . Laceration of leg 09/04/2008  . CEREBROVASCULAR ACCIDENT, HX OF 08/18/2008  . S/P CABG x 1 06/13/2008  . S/P mitral valve replacement with bioprosthetic valve 06/13/2008  . S/P Maze operation for atrial fibrillation 06/13/2008  . PROSTATE CANCER, HX OF 01/14/2008  . CATARACTS, BILATERAL, HX OF 01/14/2008  . Atrial fibrillation ([place]) 01/14/2008  . Dyslipidemia 06/01/2007  . Essential hypertension 06/01/2007  . GERD 06/01/2007  . Unspecified essential hypertension  06/01/2007  . CAD (coronary artery disease) 06/01/2007    Past Surgical History:  Procedure Laterality Date  . CARDIAC CATHETERIZATION  11/01/1990  . CARDIOVASCULAR STRESS TEST  06/03/2010   Persantine perfusion EF 45% mild to moderate ischemia basal and mid inferolateral region  . CARDIOVASCULAR STRESS TEST  01/01/2008   left ventricle normal,no significant ischemia  . CARDIOVASCULAR STRESS TEST  09/09/2005    possibility of ischemia inferior wall  . COLONOSCOPY  07/14/2009   diverticulosis, internal hemorrhoids  . CORONARY ARTERY BYPASS GRAFT  06/13/2008   graft x1 w/vein graft LAD artery by Dr. Amador [facility] at [facility]  . CORONARY STENT PLACEMENT  03/24/2014   DES to LAD  . CYSTECTOMY     Left leg  . INTRAOPERATIVE TRANSESOPHAGEAL ECHOCARDIOGRAM N/A 04/22/2014   Procedure: INTRAOPERATIVE TRANSTHORACIC ECHOCARDIOGRAM;  Surgeon: [name], MD;  Location: [place];  Service: Open Heart Surgery;  Laterality: N/A;  . LARYNGOPLASTY Left 06/07/2017   Procedure: LARYNGOPLASTY;  Surgeon: Melida Quitter, MD;  Location: [place];  Service: ENT;  Laterality: Left;  LEFT MEDIALIZATION LARYNGOPLASTY  LOCAL WITH IV SEDATION  . LEFT AND RIGHT HEART CATHETERIZATION WITH CORONARY ANGIOGRAM N/A 01/08/2014   Procedure: LEFT AND RIGHT HEART  CATHETERIZATION WITH CORONARY ANGIOGRAM;  Surgeon: Sanda Klein, MD;  Location: [facility] CATH LAB;  Service: Cardiovascular;  Laterality: N/A;  . MITRAL VALVE REPLACEMENT  06/13/2008   #20 St. Jude Bicor mitral valve ;Maze procedure at [facility] by Dr. Amador Cunas  . PACEMAKER PLACEMENT  07/13/2008   [place] dual chamber-Medtronic at [facility]   . PERCUTANEOUS CORONARY ROTOBLATOR INTERVENTION (PCI-R) N/A 03/24/2014   Procedure: PERCUTANEOUS CORONARY ROTOBLATOR INTERVENTION (PCI-R);  Surgeon: Blane Ohara, MD;  Location: [facility] CATH LAB;  Service: Cardiovascular;  Laterality: N/A;  . SKIN CANCER EXCISION     removed from forehead and right leg, nose/face  . TEE WITHOUT CARDIOVERSION N/A 01/22/2014    Procedure: TRANSESOPHAGEAL ECHOCARDIOGRAM (TEE);  Surgeon: Sanda Klein, MD;  Location: [facility] ENDOSCOPY;  Service: Cardiovascular;  Laterality: N/A;  . TONSILLECTOMY    . TRANSCATHETER AORTIC VALVE REPLACEMENT, TRANSFEMORAL N/A 04/22/2014   Procedure: TRANSCATHETER AORTIC VALVE REPLACEMENT, TRANSFEMORAL;  Surgeon: [name], MD;  Location: [place];  Service: Open Heart Surgery;  Laterality: N/A;  TAVR-TF (RIGHT SIDE)  . UPPER GASTROINTESTINAL ENDOSCOPY  07/14/2009   erosive reflux esopagitis, Barrett's esophagus       Home Medications    Prior to Admission medications   Medication Sig Start Date End Date Taking? Authorizing Provider  clopidogrel (PLAVIX) 75 MG tablet Take 1 tablet (75 mg total) by mouth daily with breakfast. 07/29/16  Yes [name], [name], MD  furosemide (LASIX) 40 MG tablet Pt takes one tablet on Monday, Wednesday, and Friday.  Pt takes two tablets on Sunday, Tuesday, Thursday, and Saturday. Patient taking differently: Take 20-40 mg by mouth See admin instructions. Pt takes one tablet on Monday, Wednesday, and Friday.  Pt takes two tablets on Sunday, Tuesday, Thursday, and Saturday. 06/27/17  Yes Eulogio Bear U, DO  metoprolol succinate (TOPROL XL) 25 MG 24 hr tablet Take 1 tablet (25 mg total) by mouth daily. 08/28/17  Yes Barrett, Evelene Croon, PA-C  Multiple Vitamin (MULTIVITAMIN WITH MINERALS) TABS Take 1 tablet by mouth daily. Centrum Silver   Yes [provider]  pantoprazole (PROTONIX) 40 MG tablet Take 1 tablet (40 mg total) by mouth daily. 11/09/16  Yes [name], MD  potassium chloride SA (K-DUR,KLOR-CON) 20 MEQ tablet Take 20 mEq by mouth daily.   Yes [provider]  simvastatin (ZOCOR) 40 MG tablet Take 1 tablet (40 mg total) by mouth at bedtime. 07/19/17  Yes [name], MD  Tamsulosin HCl (FLOMAX) 0.4 MG CAPS Take 0.4 mg by mouth at bedtime.    Yes [provider]  triamcinolone (KENALOG) 0.025 % cream Apply 1 application topically 3  (three) times daily as needed. For rash 08/22/17  Yes [name], MD    Family History Family History  Problem Relation Age of Onset  . Breast cancer Mother   . Stroke Mother   . Transient ischemic attack Mother     Social History Social History   Tobacco Use  . Smoking status: Former Smoker    Years: 2.00    Types: Pipe    Last attempt to quit: 11/07/1965    Years since quitting: 51.9  . Smokeless tobacco: Never Used  . Tobacco comment: quit over 40 years ago, never smoked cigarettes  Substance Use Topics  . Alcohol use: No  . Drug use: No     Allergies   Patient has no known allergies.   Review of Systems Review of Systems  Respiratory: Positive for shortness of breath.   All other systems reviewed and  are negative.    Physical Exam Updated Vital Signs BP (!) 143/100 (BP Location: Right Arm)   Pulse 77   Temp (S) 97.7 F (36.5 C) (Rectal)   Wt 68.9 kg (152 lb)   SpO2 96%   BMI 22.45 kg/m   Physical Exam  Constitutional:  Chronically ill   HENT:  Head: Normocephalic.  Mouth/Throat: Oropharynx is clear and moist.  Eyes: EOM are normal. Pupils are equal, round, and reactive to light.  Neck: Normal range of motion.  Cardiovascular: Normal rate and normal heart sounds.  Pulmonary/Chest:  Slightly tachypneic, crackles bilateral bases   Abdominal: Soft. Bowel sounds are normal.  Musculoskeletal: Normal range of motion.       Right lower leg: Normal. He exhibits edema.       Left lower leg: Normal. He exhibits edema.  Neurological: He is alert.  Skin: Skin is warm.  Psychiatric: He has a normal mood and affect.  Nursing note and vitals reviewed.    ED Treatments / Results  Labs (all labs ordered are listed, but only abnormal results are displayed) Labs Reviewed  CBC WITH DIFFERENTIAL/PLATELET - Abnormal; Notable for the following components:      Result Value   RBC 3.87 (*)    Hemoglobin 10.6 (*)    HCT 35.2 (*)    Platelets 149 (*)     Lymphs Abs 0.5 (*)    All other components within normal limits  COMPREHENSIVE METABOLIC PANEL - Abnormal; Notable for the following components:   CO2 36 (*)    Calcium 8.6 (*)    Total Protein 5.9 (*)    Albumin 3.2 (*)    ALT 16 (*)    All other components within normal limits  BRAIN NATRIURETIC PEPTIDE - Abnormal; Notable for the following components:   B Natriuretic Peptide 635.3 (*)    All other components within normal limits  I-STAT TROPONIN, ED - Abnormal; Notable for the following components:   Troponin i, poc 0.70 (*)    All other components within normal limits  I-STAT VENOUS BLOOD GAS, ED - Abnormal; Notable for the following components:   pCO2, Ven 69.6 (*)    pO2, Ven 24.0 (*)    Bicarbonate 38.2 (*)    TCO2 40 (*)    Acid-Base Excess 10.0 (*)    All other components within normal limits  I-STAT CHEM 8, ED - Abnormal; Notable for the following components:   Sodium 147 (*)    BUN 24 (*)    Calcium, Ion 1.13 (*)    TCO2 39 (*)    Hemoglobin 11.2 (*)    HCT 33.0 (*)    All other components within normal limits  BLOOD GAS, VENOUS    EKG  EKG Interpretation  Date/Time:  Sunday October 15 2017 09:21:25 EST Ventricular Rate:  77 PR Interval:    QRS Duration: 115 QT Interval:  438 QTC Calculation: 496 R Axis:   52 Text Interpretation:  Atrial fibrillation Nonspecific intraventricular conduction delay Probable anterior infarct, age indeterminate Repol abnrm, severe global ischemia (LM/MVD) ST depressions new since previous  Reconfirmed by Wandra Arthurs ([id number]) on 10/15/2017 10:49:25 AM       Radiology Dg Chest Port 1 View  Result Date: 10/15/2017 CLINICAL DATA:  83 year old male with increased shortness of breath. EXAM: PORTABLE CHEST 1 VIEW COMPARISON:  06/25/2017 FINDINGS: Stable do lead left-sided AICD device. The patient is status post median sternotomy Ann T AVR. Cardiomediastinal silhouette is enlarged but stable.  There has been interval increase of patchy  opacity in the left mid lung. Right basilar effusion with associated atelectasis, with or without superimposed consolidation. This is stable from prior study. No pneumothorax. No acute osseous abnormalities. IMPRESSION: 1. Increasing left mid lung patchy opacity, focal edema versus consolidation. 2. Continued right pleural effusion is study stated atelectasis, with or without superimposed consolidation. 3. Other stable signs of CHF including cardiomegaly and vascular congestion. Electronically Signed   By: Kristopher Oppenheim M.D.   On: 10/15/2017 09:39    Procedures Procedures (including critical care time)  CRITICAL CARE Performed by: Wandra Arthurs   Total critical care time: 30 minutes  Critical care time was exclusive of separately billable procedures and treating other patients.  Critical care was necessary to treat or prevent imminent or life-threatening deterioration.  Critical care was time spent personally by me on the following activities: development of treatment plan with patient and/or surrogate as well as nursing, discussions with consultants, evaluation of patient's response to treatment, examination of patient, obtaining history from patient or surrogate, ordering and performing treatments and interventions, ordering and review of laboratory studies, ordering and review of radiographic studies, pulse oximetry and re-evaluation of patient's condition.   Medications Ordered in ED Medications  furosemide (LASIX) injection 40 mg (not administered)  aspirin tablet 325 mg (not administered)     Initial Impression / Assessment and Plan / ED Course  I have reviewed the triage vital signs and the nursing notes.  Pertinent labs & imaging results that were available during my care of the patient were reviewed by me and considered in my medical decision making (see chart for details).    [name] [name] is a 83 y.o. male here with SOB, orthopnea. Likely CHF exacerbation. No fever or cough  to suggest pneumonia. Will get labs, BNP, CXR.   10:55 AM  EKG showed new ischemic changes. Trop 0.7, no chest pain. BNP 600, elevated compared to before. CXR showed some pulmonary edema. I think likely NSTEMI with heart failure or demand ischemia from CHF exacerbation. Give lasix, ASA. Cardiology to admit.     Final Clinical Impressions(s) / ED Diagnoses   Final diagnoses:  None    ED Discharge Orders    None       Drenda Freeze, MD 10/15/17 1056

## 2017-10-15 NOTE — ED Triage Notes (Signed)
To ED via [facility] from independent living at [facility]-- pt c/o increased shortness of breath started last night, had gotten worse throughout the night-- hx of CHF, MVR, takes lasix, pt is able to talk in sentences,

## 2017-10-15 NOTE — H&P (Signed)
Cardiology Admission History and Physical:   Patient ID: [name]; MRN: [id number]; DOB: 08/21/1929   Admission date: 10/15/2017  Primary Care Provider: [name], MD Primary Cardiologist: Dr [name]  Chief Complaint:  SOB  Patient Profile:   [name] [name] is a 83 y.o. male with a history of valvular heart disease, CAF, and diastolic CHF, admitted with acute on chronic CHF  History of Present Illness:   [name] [name] is a pleasant 83 y/o male followed by Dr [name]. He has a long complicated cardiac history. The pt had MVR with CABG x 1 at [place] in 2009. He had an LAD DES in May 2015 followed by TAVR in June 2015. He has persistent atrial fibrillation and SSS. He is s/p MDT pacemaker and is pacer dependant. He is not anticoagulated secondary to a history of GI bleeding. 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 He has problems with diastolic CHF. His last EF was 60% by echo Aug 2018 with normal prosthetic valve gradients. He frequently skips his Lasix as it interferes with his activities, he tries to stay active. He was seen in the office 09/11/17. His wgt then was 152 lbs. We decided his goal wgt would be between 150-154 lbs. He was given instructions on how to take his Lasix.   He is seen this am in the ED at [facility]. He admits to increasing DOE for the past few days. Despite this he skipped his Lasix dose yesterday. Last night he became significantly SOB and orthopneic. He was sent from [facility] to the ED by EMS. He feels better since IV Lasix in the ED but he is still SOB.    Past Medical History:  Diagnosis Date  . ANEMIA, IRON DEFICIENCY 10/13/2008  . ANXIETY 09/04/2008  . Aortic valvular disorder 08/16/2012   echo EF 45-50% LV mildly reduce,Biocor mitral valve,mild to mod. tricuspid regrug.,mild to mod. aortic regrug.  . Aortic valvular stenosis 11/19/2013  . ATRIAL FIBRILLATION, CHRONIC 01/14/2008   on warfarin since 2007  . BARRETTS ESOPHAGUS 03/20/2009  .  BASAL CELL CARCINOMA OF SKIN SITE UNSPECIFIED 09/28/2010  . Cardiomyopathy, ischemic 11/19/2013   EF 45-50% with inferior wall hypokinesis by echo 2014  . CATARACTS, BILATERAL, HX OF 01/14/2008  . CEREBROVASCULAR ACCIDENT, HX OF 08/18/2008  . CHF (congestive heart failure) ([place])   . COAGULOPATHY, COUMADIN-INDUCED 01/14/2008  . COLONIC POLYPS, ADENOMATOUS 01/14/2008  . CORONARY ARTERY DISEASE 06/01/2007   Cath 04/03/2008; Cath 11/01/1990  . Dyspnea    on exertion started couple months ago  . Dysrhythmia    HX OF [place] BRADY  . Edema, peripheral 07/30/2009   LEV doppler- no thrombus or thrombophlebitis  . Elevated PSA   . GASTRIC POLYP 05/13/2009  . GERD 06/01/2007  . History of hiatal hernia   . Hyperglycemia   . HYPERLIPIDEMIA 06/01/2007  . HYPERTENSION 06/01/2007  . HYPERTHYROIDISM 01/06/2011  . Mitral regurgitation 06/13/2008   mitral valve replacement #20 St. Jude Biocor; Maze procedure by Dr. Amador Cunas  at [facility]  . Mitral stenosis    Porcine bioprosthetic tissue valve placed 06/13/2008  . Mitral valve disorder 09/08/2011   echo EF 50-55% LV normal  . Pacemaker   . Peripheral artery disease ([place]) 06/16/2010   LEA doppler-left ABI nml 0.61 calcified vessels;right ABI  0.68  . PERSONAL HX COLONIC POLYPS 01/01/2010  . PLEURAL EFFUSION, LEFT 10/13/2008  . Prostate cancer [facility])    s/p radiation  . PROSTATE CANCER, HX OF 01/14/2008  . Prosthetic  valve dysfunction    Mitral stenosis involving bioprosthetic tissue valve placed 06/13/2008  . Restless leg syndrome   . S/P CABG x 1 06/13/2008   SVG to LAD with open vein harvest right thigh, LIMA harvested but not utilized by Dr Amador Cunas  . S/P Maze operation for atrial fibrillation 06/13/2008   Partial left atrial lesion set using cryothermy for bilateral pulmonary vein isolation by Dr Amador Cunas  . S/P mitral valve replacement with bioprosthetic valve 06/13/2008   20mm St Jude Biocor porcine bioprosthetic tissue valve by Dr Amador Cunas  . S/P TAVR (transcatheter  aortic valve replacement) 04/22/2014   29 mm Edwards Sapien XT transcatheter heart valve placed via open right transfemoral approach  . Tachy-brady syndrome ([place]) 07/13/2008   medtronic Adapta gen. model ADDR01 ,serial # [id number] H by Dr  [name] at [facility]  . UNSPECIFIED VENOUS INSUFFICIENCY 09/18/2008    Past Surgical History:  Procedure Laterality Date  . CARDIAC CATHETERIZATION  11/01/1990  . CARDIOVASCULAR STRESS TEST  06/03/2010   Persantine perfusion EF 45% mild to moderate ischemia basal and mid inferolateral region  . CARDIOVASCULAR STRESS TEST  01/01/2008   left ventricle normal,no significant ischemia  . CARDIOVASCULAR STRESS TEST  09/09/2005    possibility of ischemia inferior wall  . COLONOSCOPY  07/14/2009   diverticulosis, internal hemorrhoids  . CORONARY ARTERY BYPASS GRAFT  06/13/2008   graft x1 w/vein graft LAD artery by Dr. Amador Cunas at [facility]  . CORONARY STENT PLACEMENT  03/24/2014   DES to LAD  . CYSTECTOMY     Left leg  . INTRAOPERATIVE TRANSESOPHAGEAL ECHOCARDIOGRAM N/A 04/22/2014   Procedure: INTRAOPERATIVE TRANSTHORACIC ECHOCARDIOGRAM;  Surgeon: [name], MD;  Location: [place];  Service: Open Heart Surgery;  Laterality: N/A;  . LARYNGOPLASTY Left 06/07/2017   Procedure: LARYNGOPLASTY;  Surgeon: Melida Quitter, MD;  Location: [place];  Service: ENT;  Laterality: Left;  LEFT MEDIALIZATION LARYNGOPLASTY  LOCAL WITH IV SEDATION  . LEFT AND RIGHT HEART CATHETERIZATION WITH CORONARY ANGIOGRAM N/A 01/08/2014   Procedure: LEFT AND RIGHT HEART CATHETERIZATION WITH CORONARY ANGIOGRAM;  Surgeon: Sanda Klein, MD;  Location: [place] CATH LAB;  Service: Cardiovascular;  Laterality: N/A;  . MITRAL VALVE REPLACEMENT  06/13/2008   #20 St. Jude Bicor mitral valve ;Maze procedure at [facility] by Dr. Amador Cunas  . PACEMAKER PLACEMENT  07/13/2008   [place] dual chamber-Medtronic at [facility]   . PERCUTANEOUS CORONARY ROTOBLATOR INTERVENTION (PCI-R) N/A 03/24/2014   Procedure: PERCUTANEOUS CORONARY  ROTOBLATOR INTERVENTION (PCI-R);  Surgeon: Blane Ohara, MD;  Location: [facility] CATH LAB;  Service: Cardiovascular;  Laterality: N/A;  . SKIN CANCER EXCISION     removed from forehead and right leg, nose/face  . TEE WITHOUT CARDIOVERSION N/A 01/22/2014   Procedure: TRANSESOPHAGEAL ECHOCARDIOGRAM (TEE);  Surgeon: Sanda Klein, MD;  Location: [facility] ENDOSCOPY;  Service: Cardiovascular;  Laterality: N/A;  . TONSILLECTOMY    . TRANSCATHETER AORTIC VALVE REPLACEMENT, TRANSFEMORAL N/A 04/22/2014   Procedure: TRANSCATHETER AORTIC VALVE REPLACEMENT, TRANSFEMORAL;  Surgeon: [name], MD;  Location: [place];  Service: Open Heart Surgery;  Laterality: N/A;  TAVR-TF (RIGHT SIDE)  . UPPER GASTROINTESTINAL ENDOSCOPY  07/14/2009   erosive reflux esopagitis, Barrett's esophagus     Medications Prior to Admission: Prior to Admission medications   Medication Sig Start Date End Date Taking? Authorizing Provider  clopidogrel (PLAVIX) 75 MG tablet Take 1 tablet (75 mg total) by mouth daily with breakfast. 07/29/16  Yes [name], [name], MD  furosemide (LASIX) 40 MG tablet Pt takes one tablet  on Monday, Wednesday, and Friday.  Pt takes two tablets on Sunday, Tuesday, Thursday, and Saturday. Patient taking differently: Take 20-40 mg by mouth See admin instructions. Pt takes one tablet on Monday, Wednesday, and Friday.  Pt takes two tablets on Sunday, Tuesday, Thursday, and Saturday. 06/27/17  Yes Eulogio Bear U, DO  metoprolol succinate (TOPROL XL) 25 MG 24 hr tablet Take 1 tablet (25 mg total) by mouth daily. 08/28/17  Yes Barrett, Evelene Croon, PA-C  Multiple Vitamin (MULTIVITAMIN WITH MINERALS) TABS Take 1 tablet by mouth daily. Centrum Silver   Yes [provider]  pantoprazole (PROTONIX) 40 MG tablet Take 1 tablet (40 mg total) by mouth daily. 11/09/16  Yes [name], MD  potassium chloride SA (K-DUR,KLOR-CON) 20 MEQ tablet Take 20 mEq by mouth daily.   Yes [provider]  simvastatin (ZOCOR) 40 MG  tablet Take 1 tablet (40 mg total) by mouth at bedtime. 07/19/17  Yes [name], MD  Tamsulosin HCl (FLOMAX) 0.4 MG CAPS Take 0.4 mg by mouth at bedtime.    Yes [provider]  triamcinolone (KENALOG) 0.025 % cream Apply 1 application topically 3 (three) times daily as needed. For rash 08/22/17  Yes [name], MD     Allergies:    Allergies  Allergen Reactions  . Amiodarone     Thyroid disorder    Social History:   Social History   Socioeconomic History  . Marital status: Married    Spouse name: Not on file  . Number of children: 4  . Years of education: Not on file  . Highest education level: Not on file  Social Needs  . Financial resource strain: Not on file  . Food insecurity - worry: Not on file  . Food insecurity - inability: Not on file  . Transportation needs - medical: Not on file  . Transportation needs - non-medical: Not on file  Occupational History  . Occupation: Retired    Fish farm manager: RETIRED  Tobacco Use  . Smoking status: Former Smoker    Years: 2.00    Types: Pipe    Last attempt to quit: 11/07/1965    Years since quitting: 51.9  . Smokeless tobacco: Never Used  . Tobacco comment: quit over 40 years ago, never smoked cigarettes  Substance and Sexual Activity  . Alcohol use: No  . Drug use: No  . Sexual activity: Not on file  Other Topics Concern  . Not on file  Social History Narrative  . Not on file   Married, lives with his wife at [facility]   Family History:   The patient's family history includes Breast cancer in his mother; Stroke in his mother; Transient ischemic attack in his mother.    ROS:  Please see the history of present illness.  All other ROS reviewed and negative.     Physical Exam/Data:   Vitals:   10/15/17 0911 10/15/17 0919 10/15/17 0923  BP:  (!) 143/100   Pulse:  77   Temp:  (S) 97.7 F (36.5 C)   TempSrc:  Rectal   SpO2: 94% 96%   Weight:   152 lb (68.9 kg)   No intake or  output data in the 24 hours ending 10/15/17 1149 Filed Weights   10/15/17 0923  Weight: 152 lb (68.9 kg)   Body mass index is 22.45 kg/m.  General:  Chronically ill appearing male, mild SOB at rest, on O2 HEENT: normal Lymph: no adenopathy Neck: JVD noted Endocrine:  No thryomegaly Vascular: No carotid bruits; FA pulses intact Cardiac: irreg irreg with soft systolic murmur AOV Lungs:  Significant kyphosis, decreased breath sounds Rt 1/2 up. Scatter rhonchi and rales on Lt Abd: soft, nontender, no hepatomegaly, umbilical hernia Ext: chronic skin changes both LE Musculoskeletal:  No deformities, BUE and BLE strength normal and equal Skin: pale warm and dry  Neuro:  CNs 2-12 intact, no focal abnormalities noted Psych:  Normal affect    EKG:  The ECG that was done 10/15/17 was personally reviewed and demonstrates AF with IVCD and inferior lateral TWI- similar to his Oct 2018 EKG  Relevant CV Studies: Echo 06/26/17- Study Conclusions  - Left ventricle: The cavity size was normal. Wall thickness was   increased in a pattern of moderate LVH. Systolic function was   normal. The estimated ejection fraction was in the range of 60%   to 65%. Wall motion was normal; there were no regional wall   motion abnormalities. - Aortic valve: A bioprosthesis was present. There was trivial   regurgitation. - Mitral valve: A bioprosthesis was present. Valve area by pressure   half-time: 1.62 cm^2. - Left atrium: The atrium was moderately dilated.  Impressions:  - Normal LV systolic function; moderate LVH; s/p AVR with trace AI   and normal mean gradient of 5 mmHg; s/p MVR with normal mean   gradient of 4 mmHg and no MR; moderate LAE; mild TR.   Laboratory Data:  Chemistry Recent Labs  Lab 10/15/17 0941 10/15/17 0958  NA 145 147*  K 3.9 4.0  CL 104 101  CO2 36*  --   GLUCOSE 96 88  BUN 19 24*  CREATININE 0.90 0.90  CALCIUM 8.6*  --   GFRNONAA >60  --   GFRAA >60  --     ANIONGAP 5  --     Recent Labs  Lab 10/15/17 0941  PROT 5.9*  ALBUMIN 3.2*  AST 25  ALT 16*  ALKPHOS 75  BILITOT 0.6   Hematology Recent Labs  Lab 10/15/17 0941 10/15/17 0958  WBC 4.9  --   RBC 3.87*  --   HGB 10.6* 11.2*  HCT 35.2* 33.0*  MCV 91.0  --   MCH 27.4  --   MCHC 30.1  --   RDW 15.4  --   PLT 149*  --    Cardiac EnzymesNo results for input(s): TROPONINI in the last 168 hours.  Recent Labs  Lab 10/15/17 0943  TROPIPOC 0.70*    BNP Recent Labs  Lab 10/15/17 0941  BNP 635.3*    DDimer No results for input(s): DDIMER in the last 168 hours.  Radiology/Studies:  Dg Chest Port 1 View  Result Date: 10/15/2017 CLINICAL DATA:  83 year old male with increased shortness of breath. EXAM: PORTABLE CHEST 1 VIEW COMPARISON:  06/25/2017 FINDINGS: Stable do lead left-sided AICD device. The patient is status post median sternotomy Ann T AVR. Cardiomediastinal silhouette is enlarged but stable. There has been interval increase of patchy opacity in the left mid lung. Right basilar effusion with associated atelectasis, with or without superimposed consolidation. This is stable from prior study. No pneumothorax. No acute osseous abnormalities. IMPRESSION: 1. Increasing left mid lung patchy opacity, focal edema versus consolidation. 2. Continued right pleural effusion is study stated atelectasis, with or without superimposed consolidation. 3. Other stable signs of CHF including cardiomegaly and vascular congestion. Electronically Signed   By: Kristopher Oppenheim M.D.   On: 10/15/2017 09:39    Assessment and Plan:  Acute on chronic diastolic CHF (congestive heart failure) (HCC) wgt 152 lbs is his baseline but BNP elevated 635 and history is c/w CHF. His CXR findings c/w atypical CHF or possibly CAP though he is afebrile and WBC WNL  Atrial fibrillation (HCC) Not on anticoagulation secondary to history of GI bleeding and anemia  CAD S/P percutaneous coronary angioplasty -   Xience DES 3.0 mm x 8 mm prox LAD May 2015. Troponin elevated-0.70. Will follow may be from acute CHF.  Chronic diastolic heart failure (HCC) EF 60% by echo Aug 2018  Mitral stenosis Porcine bioprosthetic tissue valve placed 06/13/2008  Pacemaker - Medtronic Adapta, dual chamber Pacemaker dependent , 100% pacing in the atrium and ventricle  S/P CABG x 1 SVG to LAD with open vein harvest via right thigh, LIMA harvested but not utilized by Dr Amador Cunas  S/P Maze operation for atrial fibrillation Partial left atrial lesion set using cryothermy for bilateral pulmonary vein isolation by Dr Amador Cunas  S/P TAVR (transcatheter aortic valve replacement) 29 mm Edwards Sapien XT transcatheter heart valve placed via open right transfemoral approach   Plan: Admit to step down as in patient. IV Lasix 40 mg BID. Check echo.    Severity of Illness: The appropriate patient status for this patient is INPATIENT. Inpatient status is judged to be reasonable and necessary in order to provide the required intensity of service to ensure the patient's safety. The patient's presenting symptoms, physical exam findings, and initial radiographic and laboratory data in the context of their chronic comorbidities is felt to place them at high risk for further clinical deterioration. Furthermore, it is not anticipated that the patient will be medically stable for discharge from the hospital within 2 midnights of admission. The following factors support the patient status of inpatient.   " The patient's presenting symptoms include dyspnea. " The worrisome physical exam findings include CHF. " The initial radiographic and laboratory data are worrisome because of CHF. " The chronic co-morbidities include CAD, CHF.   * I certify that at the point of admission it is my clinical judgment that the patient will require inpatient hospital care spanning beyond 2 midnights from the point of admission due to high intensity  of service, high risk for further deterioration and high frequency of surveillance required.*    For questions or updates, please contact [place] Please consult [email or web address] for contact info under Cardiology/STEMI.    Signed, [name] Ransom, PA-C  10/15/2017 11:49 AM   The patient was seen and examined, and I agree with the history, physical exam, assessment and plan as documented above, with modifications as noted below. I have also personally reviewed all relevant documentation, old records, labs, and both radiographic and cardiovascular studies. I have also independently interpreted old and new ECG's.  Very pleasant 83 year old gentleman with a complicated cardiac history as detailed above including mitral valve replacement, coronary arteries bypass graft surgery, drug-eluting stent placement in May 2015 followed byTAVR in June 2015.  He also has sick sinus syndrome and persistent atrial fibrillation and has a pacemaker and he is pacemaker dependent.  He ran the fastest mile in the country in high school in 1950 and broke the [place] high school state record.  He saw [name] [name] in the office in early November.  At that time it was noted that he does not like taking Lasix as it interferes with his activities.  He began developing shortness of breath and orthopnea over the past few days.  He said he "  did not make it to the pillbox yesterday "and did not take his Lasix. He denies chest pain.  He has some chronic lower extremity stasis dermatitis and associated edema but has noticed some mild increase in left leg swelling recently.  Echocardiogram 06/26/17: Normal left ventricular systolic function and regional wall motion, LVEF 60-65%, moderate LVH, normally functioning aortic and mitral bioprosthesis.  BNP elevated at 635.  His portable chest x-ray showed increasing left midlung patchy opacity demonstrating focal edema versus consolidation, continued right pleural effusion with or  without superimposed consolidation, and stable CHF.  He has been given 1 dose of IV Lasix in the ED and already feels symptomatic improvement. I would continue IV Lasix 40 mg twice daily. He is not a candidate for anticoagulation due to prior history of GI bleeding.  He is on aspirin, Plavix, metoprolol, and statin therapy. Point-of-care troponin is elevated at 0.7.  This may be related to acute diastolic heart failure.  Given his history of ischemic heart disease, I would recommend checking serial serum troponins.   Kate Sable, MD, [facility]  10/15/2017 12:28 PM

## 2017-10-16 ENCOUNTER — Inpatient Hospital Stay (HOSPITAL_COMMUNITY): Payer: Medicare Other

## 2017-10-16 DIAGNOSIS — I361 Nonrheumatic tricuspid (valve) insufficiency: Secondary | ICD-10-CM

## 2017-10-16 DIAGNOSIS — I371 Nonrheumatic pulmonary valve insufficiency: Secondary | ICD-10-CM

## 2017-10-16 LAB — PROTIME-INR
INR: 1.21
Prothrombin Time: 15.2 seconds (ref 11.4–15.2)

## 2017-10-16 LAB — CBC
HCT: 34.3 % — ABNORMAL LOW (ref 39.0–52.0)
Hemoglobin: 10 g/dL — ABNORMAL LOW (ref 13.0–17.0)
MCH: 26.7 pg (ref 26.0–34.0)
MCHC: 29.2 g/dL — ABNORMAL LOW (ref 30.0–36.0)
MCV: 91.7 fL (ref 78.0–100.0)
Platelets: 136 10*3/uL — ABNORMAL LOW (ref 150–400)
RBC: 3.74 MIL/uL — ABNORMAL LOW (ref 4.22–5.81)
RDW: 15.4 % (ref 11.5–15.5)
WBC: 5 10*3/uL (ref 4.0–10.5)

## 2017-10-16 LAB — ECHOCARDIOGRAM COMPLETE
Area-P 1/2: 1.75 cm2
E decel time: 475 ms
FS: 21 % — AB (ref 28–44)
IVS/LV PW RATIO, ED: 1.08
LA ID, A-P, ES: 49 mm
LA diam end sys: 49 mm
LA diam index: 2.69 cm/m2
LA vol A4C: 77.8 mL
LA vol index: 53.9 mL/m2
LA vol: 98.1 mL
LV PW d: 12 mm — AB (ref 0.6–1.1)
LVOT area: 3.14 cm2
LVOT diameter: 20 mm
MV Annulus VTI: 50.7 cm
MV Dec: 475
MV M vel: 126
MV Peak grad: 16 mmHg
MV pk E vel: 202 m/s
Mean grad: 8 mmHg
P 1/2 time: 130 ms
PV Reg grad dias: 19 mmHg
PV Reg vel dias: 217 cm/s
RV sys press: 68 mmHg
Reg peak vel: 363 cm/s
TAPSE: 9.15 mm
TR max vel: 363 cm/s
Weight: 2398.6 [oz_av]

## 2017-10-16 LAB — BASIC METABOLIC PANEL
Anion gap: 4 — ABNORMAL LOW (ref 5–15)
BUN: 22 mg/dL — ABNORMAL HIGH (ref 6–20)
CO2: 40 mmol/L — ABNORMAL HIGH (ref 22–32)
Calcium: 8.2 mg/dL — ABNORMAL LOW (ref 8.9–10.3)
Chloride: 100 mmol/L — ABNORMAL LOW (ref 101–111)
Creatinine, Ser: 1.01 mg/dL (ref 0.61–1.24)
GFR calc Af Amer: >60 mL/min (ref >60)
GFR calc non Af Amer: >60 mL/min (ref >60)
Glucose, Bld: 98 mg/dL (ref 65–99)
Potassium: 3.8 mmol/L (ref 3.5–5.1)
Sodium: 144 mmol/L (ref 135–145)

## 2017-10-16 LAB — TROPONIN I
Troponin I: 0.34 ng/mL (ref <0.03)
Troponin I: 0.29 ng/mL (ref <0.03)
Troponin I: 0.29 ng/mL (ref <0.03)

## 2017-10-16 MED ORDER — FUROSEMIDE 10 MG/ML IJ SOLN
40.0000 mg | Freq: Once | INTRAMUSCULAR | Status: AC
  Administered 2017-10-16: 40 mg via INTRAVENOUS
  Filled 2017-10-16: qty 4

## 2017-10-16 NOTE — Progress Notes (Signed)
  Echocardiogram 2D Echocardiogram has been performed.  Kennidee Heyne G Suhey Radford 10/16/2017, 11:22 AM

## 2017-10-16 NOTE — Progress Notes (Signed)
Progress Note  Patient Name: [name] [name] Date of Encounter: 10/16/2017  Primary Cardiologist: Dr. [name]  Subjective   Breathing improving. No chest pain. New R hand tremors.   Inpatient Medications    Scheduled Meds: . clopidogrel  75 mg Oral Q breakfast  . heparin  5,000 Units Subcutaneous Q8H  . metoprolol succinate  25 mg Oral Daily  . multivitamin with minerals  1 tablet Oral Daily  . pantoprazole  40 mg Oral Daily  . potassium chloride SA  20 mEq Oral Daily  . simvastatin  40 mg Oral QHS  . tamsulosin  0.4 mg Oral QHS   Continuous Infusions: . sodium chloride     PRN Meds: sodium chloride, acetaminophen, albuterol, nitroGLYCERIN, ondansetron (ZOFRAN) IV   Vital Signs    Vitals:   10/16/17 0200 10/16/17 0500 10/16/17 0606 10/16/17 0824  BP:   109/72 112/70  Pulse: 66  77   Resp: (!) 21  (!) 21   Temp:   98.1 F (36.7 C) 97.7 F (36.5 C)  TempSrc:   Oral Oral  SpO2: 99%  99%   Weight:  149 lb 14.6 oz (68 kg)      Intake/Output Summary (Last 24 hours) at 10/16/2017 3557 Last data filed at 10/15/2017 1856 Gross per 24 hour  Intake 120 ml  Output 475 ml  Net -355 ml   Filed Weights   10/15/17 0923 10/16/17 0500  Weight: 152 lb (68.9 kg) 149 lb 14.6 oz (68 kg)    Telemetry    Atrial fibrillation at rate of 60-70s - Personally Reviewed  ECG  N/A  Physical Exam   GEN: Elderly frail male in no acute distress.   Neck: + JVD Cardiac: Irregularly irregular , no murmurs, rubs, or gallops.  Respiratory: Bibasilar rales GI: Soft, nontender, non-distended  MS: No edema; No deformity. Neuro:  Nonfocal  Psych: Normal affect   Labs    Chemistry Recent Labs  Lab 10/15/17 0941 10/15/17 0958 10/16/17 0328  NA 145 147* 144  K 3.9 4.0 3.8  CL 104 101 100*  CO2 36*  --  40*  GLUCOSE 96 88 98  BUN 19 24* 22*  CREATININE 0.90 0.90 1.01  CALCIUM 8.6*  --  8.2*  PROT 5.9*  --   --   ALBUMIN 3.2*  --   --   AST 25  --   --   ALT 16*  --    --   ALKPHOS 75  --   --   BILITOT 0.6  --   --   GFRNONAA >60  --  >60  GFRAA >60  --  >60  ANIONGAP 5  --  4*    Hematology Recent Labs  Lab 10/15/17 0941 10/15/17 0958 10/16/17 0328  WBC 4.9  --  5.0  RBC 3.87*  --  3.74*  HGB 10.6* 11.2* 10.0*  HCT 35.2* 33.0* 34.3*  MCV 91.0  --  91.7  MCH 27.4  --  26.7  MCHC 30.1  --  29.2*  RDW 15.4  --  15.4  PLT 149*  --  136*   Recent Labs  Lab 10/15/17 0943  TROPIPOC 0.70*    BNP Recent Labs  Lab 10/15/17 0941  BNP 635.3*     Radiology    Dg Chest Port 1 View  Result Date: 10/15/2017 CLINICAL DATA:  83 year old male with increased shortness of breath. EXAM: PORTABLE CHEST 1 VIEW COMPARISON:  06/25/2017 FINDINGS: Stable do lead left-sided  AICD device. The patient is status post median sternotomy Ann T AVR. Cardiomediastinal silhouette is enlarged but stable. There has been interval increase of patchy opacity in the left mid lung. Right basilar effusion with associated atelectasis, with or without superimposed consolidation. This is stable from prior study. No pneumothorax. No acute osseous abnormalities. IMPRESSION: 1. Increasing left mid lung patchy opacity, focal edema versus consolidation. 2. Continued right pleural effusion is study stated atelectasis, with or without superimposed consolidation. 3. Other stable signs of CHF including cardiomegaly and vascular congestion. Electronically Signed   By: Kristopher Oppenheim M.D.   On: 10/15/2017 09:39    Cardiac Studies   Pending echo this admission  Patient Profile     83 y.o. male with hx of CAD s/p CABG x 1 in 2009 & DES to LAD 03/2014, s/p TVAR, persistent atrial fibrillation (not on anticoagulation due to prior GI bleed) s/p Maze, amiodarone induced hyperthyroidism in the past, mitral stenosis s/p Porcine bioprosthetic tissue valve placed 07/14/4162, chronic diastolic CHF, SSS s/p  Pacemaker, HTN, HLD admitted with acute CHF and elevated troponin.   Echocardiogram 06/26/17:  Normal left ventricular systolic function and regional wall motion, LVEF 60-65%, moderate LVH, normally functioning aortic and mitral bioprosthesis.  Assessment & Plan    1. Acute on chronic diastolic CHF - BNP 845. CXR with vascular congestion. Given IV lasix 40mg  x 2. Net I & O negative 355cc + 1.5L on bag. Weight down 2lb. Has crackles on lungs. Will give another Lasix 40mg  x 1. Further diuretics per MD.   2. Elevated troponin with hx of CAD - No chest pain. Likely demand in setting of acute CHF. Continue cycle troponin. Continue Plavix, statin and BB.   3. Atrial fibrillation  - Not on anticoagulation secondary to history of GI bleeding and anemia - Rate controlled.   4. R hand Tremors - Will continue to follow. No prior hx.   For questions or updates, please contact [place] Please consult [email or web address] for contact info under Cardiology/STEMI.      Signed, Leanor Kail, PA  10/16/2017, 8:28 AM    History and all data above reviewed.  Patient examined.  I agree with the findings as above.  The patient exam reveals COR:RRR  ,  Lungs: Decreased breath sounds,  Abd: Positive bowel sounds, no rebound no guarding, Ext No edema  .  All available labs, radiology testing, previous records reviewed. Agree with documented assessment and plan. Actue diastolic HF:  Agree with IV Lasix again today and will give PM dose as well.  Then decide in the AM the need for continued IV diuresis.  Elevated troponin:  Enzymes were not trended.  Need to trend these but suspect that he will be managed conservatively for what is probably demand ischemia.  Atrial Fib:  Rate controlled.  Tremor:  This was not evident when I was in the room.  Follow.    Jeneen Rinks Shemuel Harkleroad  10:04 AM  10/16/2017

## 2017-10-17 ENCOUNTER — Other Ambulatory Visit: Payer: Self-pay

## 2017-10-17 ENCOUNTER — Inpatient Hospital Stay (HOSPITAL_COMMUNITY): Payer: Medicare Other

## 2017-10-17 DIAGNOSIS — R748 Abnormal levels of other serum enzymes: Secondary | ICD-10-CM

## 2017-10-17 DIAGNOSIS — I482 Chronic atrial fibrillation: Secondary | ICD-10-CM

## 2017-10-17 LAB — BASIC METABOLIC PANEL
ANION GAP: 7 (ref 5–15)
BUN: 21 mg/dL — AB (ref 6–20)
CALCIUM: 8.4 mg/dL — AB (ref 8.9–10.3)
CO2: 38 mmol/L — ABNORMAL HIGH (ref 22–32)
Chloride: 97 mmol/L — ABNORMAL LOW (ref 101–111)
Creatinine, Ser: 0.93 mg/dL (ref 0.61–1.24)
GFR calc Af Amer: >60 mL/min (ref >60)
GLUCOSE: 121 mg/dL — AB (ref 65–99)
POTASSIUM: 3.7 mmol/L (ref 3.5–5.1)
SODIUM: 142 mmol/L (ref 135–145)

## 2017-10-17 MED ORDER — FUROSEMIDE 10 MG/ML IJ SOLN
40.0000 mg | Freq: Two times a day (BID) | INTRAMUSCULAR | Status: DC
  Administered 2017-10-17 – 2017-10-18 (×3): 40 mg via INTRAVENOUS
  Filled 2017-10-17 (×3): qty 4

## 2017-10-17 MED ORDER — ENOXAPARIN SODIUM 40 MG/0.4ML ~~LOC~~ SOLN
40.0000 mg | SUBCUTANEOUS | Status: DC
  Administered 2017-10-17 – 2017-10-19 (×3): 40 mg via SUBCUTANEOUS
  Filled 2017-10-17 (×3): qty 0.4

## 2017-10-17 NOTE — Progress Notes (Signed)
Progress Note  Patient Name: [name] [name] Date of Encounter: 10/17/2017  Primary Cardiologist: Dr. [name]  Subjective   The patient is SOB while talking.   Inpatient Medications    Scheduled Meds: . clopidogrel  75 mg Oral Q breakfast  . heparin  5,000 Units Subcutaneous Q8H  . metoprolol succinate  25 mg Oral Daily  . multivitamin with minerals  1 tablet Oral Daily  . pantoprazole  40 mg Oral Daily  . potassium chloride SA  20 mEq Oral Daily  . simvastatin  40 mg Oral QHS  . tamsulosin  0.4 mg Oral QHS   Continuous Infusions: . sodium chloride     PRN Meds: sodium chloride, acetaminophen, albuterol, nitroGLYCERIN, ondansetron (ZOFRAN) IV   Vital Signs    Vitals:   10/17/17 0059 10/17/17 0323 10/17/17 0331 10/17/17 0439  BP: 113/73 132/88    Pulse: 91 70  85  Resp: (!) 33 (!) 24  (!) 24  Temp: 98.2 F (36.8 C) 98.1 F (36.7 C)    TempSrc: Axillary Axillary    SpO2: 95% 95%  100%  Weight:   154 lb 12.2 oz (70.2 kg)     Intake/Output Summary (Last 24 hours) at 10/17/2017 0825 Last data filed at 10/17/2017 0300 Gross per 24 hour  Intake 800 ml  Output 3000 ml  Net -2200 ml   Filed Weights   10/15/17 0923 10/16/17 0500 10/17/17 0331  Weight: 152 lb (68.9 kg) 149 lb 14.6 oz (68 kg) 154 lb 12.2 oz (70.2 kg)    Telemetry    Atrial fibrillation at rate of 60-70s - Personally Reviewed  ECG  N/A  Physical Exam   GEN: Elderly frail male gasping for breath while talking   Neck: +8 cm JVD Cardiac: Irregularly irregular , no murmurs, rubs, or gallops.  Respiratory: Bibasilar rales GI: Soft, nontender, non-distended  MS: No edema; No deformity. Neuro:  Nonfocal  Psych: Normal affect   Labs    Chemistry Recent Labs  Lab 10/15/17 0941 10/15/17 0958 10/16/17 0328 10/17/17 0434  NA 145 147* 144 142  K 3.9 4.0 3.8 3.7  CL 104 101 100* 97*  CO2 36*  --  40* 38*  GLUCOSE 96 88 98 121*  BUN 19 24* 22* 21*  CREATININE 0.90 0.90 1.01 0.93    CALCIUM 8.6*  --  8.2* 8.4*  PROT 5.9*  --   --   --   ALBUMIN 3.2*  --   --   --   AST 25  --   --   --   ALT 16*  --   --   --   ALKPHOS 75  --   --   --   BILITOT 0.6  --   --   --   GFRNONAA >60  --  >60 >60  GFRAA >60  --  >60 >60  ANIONGAP 5  --  4* 7    Hematology Recent Labs  Lab 10/15/17 0941 10/15/17 0958 10/16/17 0328  WBC 4.9  --  5.0  RBC 3.87*  --  3.74*  HGB 10.6* 11.2* 10.0*  HCT 35.2* 33.0* 34.3*  MCV 91.0  --  91.7  MCH 27.4  --  26.7  MCHC 30.1  --  29.2*  RDW 15.4  --  15.4  PLT 149*  --  136*   Recent Labs  Lab 10/15/17 0943  TROPIPOC 0.70*    BNP Recent Labs  Lab 10/15/17 0941  BNP 635.3*  Radiology    Dg Chest Port 1 View  Result Date: 10/15/2017 CLINICAL DATA:  83 year old male with increased shortness of breath. EXAM: PORTABLE CHEST 1 VIEW COMPARISON:  06/25/2017 FINDINGS: Stable do lead left-sided AICD device. The patient is status post median sternotomy Ann T AVR. Cardiomediastinal silhouette is enlarged but stable. There has been interval increase of patchy opacity in the left mid lung. Right basilar effusion with associated atelectasis, with or without superimposed consolidation. This is stable from prior study. No pneumothorax. No acute osseous abnormalities. IMPRESSION: 1. Increasing left mid lung patchy opacity, focal edema versus consolidation. 2. Continued right pleural effusion is study stated atelectasis, with or without superimposed consolidation. 3. Other stable signs of CHF including cardiomegaly and vascular congestion. Electronically Signed   By: Kristopher Oppenheim M.D.   On: 10/15/2017 09:39    Cardiac Studies   TTE: 10/16/2017 - Left ventricle: The cavity size was normal. There was mild   concentric hypertrophy. Systolic function was normal. The   estimated ejection fraction was in the range of 50% to 55%. Mild   hypokinesis of the anteroseptal and inferoseptal myocardium. The   study is not technically sufficient to  allow evaluation of LV   diastolic function. - Aortic valve: A bioprosthesis was present. Transvalvular velocity   was within the normal range. There was no stenosis. There was   mild regurgitation. Peak velocity (S): 200 cm/s. - Mitral valve: A bioprosthesis was present. Pressure half-time:   130 ms. Mean gradient (D): 8 mm Hg. - Left atrium: The atrium was severely dilated. - Right ventricle: The cavity size was normal. Wall thickness was   normal. Systolic function was normal. - Tricuspid valve: There was moderate regurgitation. - Pulmonic valve: There was moderate regurgitation. - Pulmonary arteries: Systolic pressure was severely increased. PA   peak pressure: 68 mm Hg (S).  Impressions:  - Mean gradient across the bioprosthetic mitral valve has increased   from 4 mmHg to 8 mmHg since 06/2017.    Patient Profile     83 y.o. 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   Echocardiogram 06/26/17: Normal left ventricular systolic function and regional wall motion, LVEF 60-65%, moderate LVH, normally functioning aortic and mitral bioprosthesis.  Assessment & Plan    1. Acute on chronic diastolic CHF - BNP 353. CXR with vascular congestion. Given IV lasix 40mg  x 2.  Negative 2.2 L since yesterday, weight inaccurate, 150 lbs, in April 2018 161 lbs. - I will continue lasix 40 mg iv BID, care and K stable.   2. Elevated troponin with hx of CAD, now downtrending - No chest pain. Likely demand in setting of acute CHF.  Continue Plavix, statin and BB.   3. Atrial fibrillation  - Not on anticoagulation secondary to history of GI bleeding and anemia - Rate  controlled.   4. R hand Tremors - Will continue to follow. No prior hx.   5. S/P AVR and MVR, normal transaortic gradients, increased mean transmitral gradients 4--> 8 mmHg, the patient is not a surgical candidate and not TMVR candidate, he also has severe pulmonary hypertension, he appears cachectic and is non mobile. I am worrying that  the only option long term will be hospice care.   For questions or updates, please contact [place] Please consult [email or web address] for contact info under Cardiology/STEMI.   Signed, Ena Dawley, MD  10/17/2017, 8:25 AM

## 2017-10-18 ENCOUNTER — Ambulatory Visit: Payer: Medicare Other | Admitting: Podiatry

## 2017-10-18 ENCOUNTER — Encounter (HOSPITAL_COMMUNITY): Payer: Self-pay | Admitting: Student

## 2017-10-18 ENCOUNTER — Inpatient Hospital Stay (HOSPITAL_COMMUNITY): Payer: Medicare Other

## 2017-10-18 HISTORY — PX: IR THORACENTESIS ASP PLEURAL SPACE W/IMG GUIDE: IMG5380

## 2017-10-18 LAB — BASIC METABOLIC PANEL
Anion gap: 6 (ref 5–15)
BUN: 21 mg/dL — AB (ref 6–20)
CO2: 42 mmol/L — ABNORMAL HIGH (ref 22–32)
CREATININE: 0.85 mg/dL (ref 0.61–1.24)
Calcium: 8.4 mg/dL — ABNORMAL LOW (ref 8.9–10.3)
Chloride: 95 mmol/L — ABNORMAL LOW (ref 101–111)
GFR calc Af Amer: >60 mL/min (ref >60)
GLUCOSE: 106 mg/dL — AB (ref 65–99)
POTASSIUM: 3.7 mmol/L (ref 3.5–5.1)
SODIUM: 143 mmol/L (ref 135–145)

## 2017-10-18 MED ORDER — FUROSEMIDE 10 MG/ML IJ SOLN
40.0000 mg | Freq: Every day | INTRAMUSCULAR | Status: DC
Start: 2017-10-19 — End: 2017-10-20
  Administered 2017-10-19: 40 mg via INTRAVENOUS
  Filled 2017-10-18: qty 4

## 2017-10-18 MED ORDER — LIDOCAINE 2% (20 MG/ML) 5 ML SYRINGE
INTRAMUSCULAR | Status: AC
  Filled 2017-10-18: qty 10

## 2017-10-18 MED ORDER — ENSURE ENLIVE PO LIQD
237.0000 mL | Freq: Three times a day (TID) | ORAL | Status: DC
Start: 2017-10-18 — End: 2017-10-20
  Administered 2017-10-18 – 2017-10-19 (×5): 237 mL via ORAL

## 2017-10-18 NOTE — Progress Notes (Signed)
Patient bleeding at thoracentesis site. Applied pressure and placed new dressing. VSS. Will continue to monitor.

## 2017-10-18 NOTE — Plan of Care (Signed)
Pt has to be constantly reminded to call for assistance instead of trying to get out of bed alone.

## 2017-10-18 NOTE — Progress Notes (Signed)
Progress Note  Patient Name: [name] [name] Date of Encounter: 10/18/2017  Primary Cardiologist: Sanda Klein, MD   Subjective   He feels better today, still some SOB.  Inpatient Medications    Scheduled Meds: . clopidogrel  75 mg Oral Q breakfast  . enoxaparin (LOVENOX) injection  40 mg Subcutaneous Q24H  . furosemide  40 mg Intravenous BID  . metoprolol succinate  25 mg Oral Daily  . multivitamin with minerals  1 tablet Oral Daily  . pantoprazole  40 mg Oral Daily  . potassium chloride SA  20 mEq Oral Daily  . simvastatin  40 mg Oral QHS  . tamsulosin  0.4 mg Oral QHS   Continuous Infusions: . sodium chloride     PRN Meds: sodium chloride, acetaminophen, albuterol, nitroGLYCERIN, ondansetron (ZOFRAN) IV   Vital Signs    Vitals:   10/18/17 0040 10/18/17 0457 10/18/17 0808 10/18/17 0811  BP: 126/82 126/80 130/84 130/84  Pulse: 90 90 90 90  Resp: (!) 26 (!) 22 (!) 22   Temp:  (!) 97.4 F (36.3 C) 97.7 F (36.5 C)   TempSrc:  Axillary Axillary   SpO2: 100% 99% 93%   Weight:  155 lb 13.8 oz (70.7 kg)    Height:        Intake/Output Summary (Last 24 hours) at 10/18/2017 0829 Last data filed at 10/18/2017 0800 Gross per 24 hour  Intake 960 ml  Output 2150 ml  Net -1190 ml   Filed Weights   10/16/17 0500 10/17/17 0331 10/18/17 0457  Weight: 149 lb 14.6 oz (68 kg) 154 lb 12.2 oz (70.2 kg) 155 lb 13.8 oz (70.7 kg)    Telemetry    AF, CVR, PVCs - Personally Reviewed  ECG     AF, poor ant RW, diffuse ST depression- Personally Reviewed  Physical Exam   GEN: On O2, SOB with conversation Neck: No JVD Cardiac: irreg irreg, soft systolic murmur AOV, LSB Respiratory: severe kyphosis, crackles Lt base, decreased BS 1/2 up on Rt GI: Soft, nontender, non-distended  MS: No edema; No deformity. Chronic skin changes both LE Neuro:  Nonfocal  Psych: Normal affect   Labs    Chemistry Recent Labs  Lab 10/15/17 0941  10/16/17 0328 10/17/17 0434  10/18/17 0439  NA 145   < > 144 142 143  K 3.9   < > 3.8 3.7 3.7  CL 104   < > 100* 97* 95*  CO2 36*  --  40* 38* 42*  GLUCOSE 96   < > 98 121* 106*  BUN 19   < > 22* 21* 21*  CREATININE 0.90   < > 1.01 0.93 0.85  CALCIUM 8.6*  --  8.2* 8.4* 8.4*  PROT 5.9*  --   --   --   --   ALBUMIN 3.2*  --   --   --   --   AST 25  --   --   --   --   ALT 16*  --   --   --   --   ALKPHOS 75  --   --   --   --   BILITOT 0.6  --   --   --   --   GFRNONAA >60  --  >60 >60 >60  GFRAA >60  --  >60 >60 >60  ANIONGAP 5  --  4* 7 6   < > = values in this interval not displayed.  Hematology Recent Labs  Lab 10/15/17 0941 10/15/17 0958 10/16/17 0328  WBC 4.9  --  5.0  RBC 3.87*  --  3.74*  HGB 10.6* 11.2* 10.0*  HCT 35.2* 33.0* 34.3*  MCV 91.0  --  91.7  MCH 27.4  --  26.7  MCHC 30.1  --  29.2*  RDW 15.4  --  15.4  PLT 149*  --  136*    Cardiac Enzymes Recent Labs  Lab 10/16/17 0909 10/16/17 1540 10/16/17 2017  TROPONINI 0.34* 0.29* 0.29*    Recent Labs  Lab 10/15/17 0943  TROPIPOC 0.70*     BNP Recent Labs  Lab 10/15/17 0941  BNP 635.3*     DDimer No results for input(s): DDIMER in the last 168 hours.   Radiology    Dg Chest 2 View  Result Date: 10/17/2017 CLINICAL DATA:  Shortness of Breath EXAM: CHEST  2 VIEW COMPARISON:  10/15/2017 FINDINGS: Moderate right pleural effusion again noted. There is cardiomegaly with bilateral perihilar and lower lobe airspace opacities, favor edema/ CHF. Prior aortic valve repair. Left pacer is unchanged. IMPRESSION: Continued CHF pattern.  Moderate right pleural effusion. Electronically Signed   By: Rolm Baptise M.D.   On: 10/17/2017 09:43    Cardiac Studies   Echo 10/16/17 Study Conclusions  - Left ventricle: The cavity size was normal. There was mild   concentric hypertrophy. Systolic function was normal. The   estimated ejection fraction was in the range of 50% to 55%. Mild   hypokinesis of the anteroseptal and  inferoseptal myocardium. The   study is not technically sufficient to allow evaluation of LV   diastolic function. - Aortic valve: A bioprosthesis was present. Transvalvular velocity   was within the normal range. There was no stenosis. There was   mild regurgitation. Peak velocity (S): 200 cm/s. - Mitral valve: A bioprosthesis was present. Pressure half-time:   130 ms. Mean gradient (D): 8 mm Hg. - Left atrium: The atrium was severely dilated. - Right ventricle: The cavity size was normal. Wall thickness was   normal. Systolic function was normal. - Tricuspid valve: There was moderate regurgitation. - Pulmonic valve: There was moderate regurgitation. - Pulmonary arteries: Systolic pressure was severely increased. PA   peak pressure: 68 mm Hg (S).  Impressions:  - Mean gradient across the bioprosthetic mitral valve has increased   from 4 mmHg to 8 mmHg since 06/2017.  Patient Profile     83 y.o. male with a history of valvular heart disease and CAD. He is s/p MVR with CABG x 1 at [place] in 2009. He had an LAD DES in May 2015 followed by TAVR in June 2015. He has persistent atrial fibrillation and SSS. He is s/p MDT pacemaker and is pacer dependant. He is not anticoagulated secondary to a history of GI bleeding. He has diastolic CHF and pulmonary HTN and was admitted 10/15/17 with acute on chronic diastolic CHF.   Assessment & Plan    Acute on chronic diastolic CHF (congestive heart failure) (HCC) BNP elevated 635 on adm and CXR findings c/w atypical CHF. His history is c/w CHF. He has diuresed 3.7L. His CO2 is now 42.  Atrial fibrillation ([place]) Not on anticoagulation secondary to history of GI bleeding and anemia  CAD S/P percutaneous coronary angioplasty - Xience DES 3.0 mm x 8 mm prox LAD May 2015. Troponin elevated-0.70. Will follow may be from acute CHF.  Chronic diastolic heart failure (HCC) EF 50-55% by echo 10/16/17  Mitral  stenosis Porcine bioprosthetic tissue valve  placed 06/13/2008  Pacemaker - Medtronic Adapta, dual chamber Pacemaker dependent , 100% pacing in the atrium and ventricle  S/P CABG x 1 SVG to LAD with open vein harvest via right thigh, LIMA harvested but not utilized by Dr Amador Cunas  S/P Maze operation for atrial fibrillation Partial left atrial lesion set using cryothermy for bilateral pulmonary vein isolation by Dr Amador Cunas  S/P TAVR (transcatheter aortic valve replacement) 29 mm Edwards Sapien XT transcatheter heart valve placed via open right transfemoral approach  Pulmonary HTN PA 68 mmHg  Plan: CO2 42, consider decreasing diuretics. Check f/u BNP. ? Tap Rt pleural effusion.   Unfortunately he does not appear significantly improved despite diuresis. He and his wife live alone in the'yre own home, no family in the area.   For questions or updates, please contact [place] Please consult [email or web address] for contact info under Cardiology/STEMI.      Signed, [name], PA-C  10/18/2017, 8:29 AM    The patient was seen, examined and discussed with [name], PA-C and I agree with the above.   The patient doesn't look significantly fluid overloaded, LE edema has improved significantly, however he has a moderate right pleural effusion. We will arrange for a thoracentesis. I will decrease Lasix to 40 mg iv daily. We will start with PT&OT, he might need to go to an outpatient rehab or a nursing home given his deconditioning and no help at home.   Ena Dawley, MD 10/18/2017

## 2017-10-18 NOTE — NC FL2 (Signed)
[place] LEVEL OF CARE SCREENING TOOL     IDENTIFICATION  Patient Name: [name] [name] Birthdate: 04-Aug-1929 Sex: male Admission Date (Current Location): 10/15/2017  [facility] and [place] Number:  Herbalist and Address:  [facility] [place], [place] [address] [place], [place] [address]      Provider Number: [id number]  Attending Physician Name and Address:  Sanda Klein, MD  Relative Name and Phone Number:       Current Level of Care: Hospital Recommended Level of Care: [place] Prior Approval Number:    Date Approved/Denied:   PASRR Number: [id number]  Discharge Plan: SNF    Current Diagnoses: Patient Active Problem List   Diagnosis Date Noted  . Acute on chronic diastolic (congestive) heart failure ([place]) 10/15/2017  . Osteopenia 08/22/2017  . Acute on chronic diastolic heart failure ([place]) 06/25/2017  . Overflow incontinence 06/25/2017  . Vocal fold paralysis, left 06/07/2017  . Hypocalcemia 12/29/2016  . CAP (community acquired pneumonia) 12/17/2016  . Elevated troponin 12/17/2016  . CHF (congestive heart failure) ([place]) 12/17/2016  . Pressure ulcer of sacral region, stage 1 09/23/2016  . Permanent atrial fibrillation ([place]) 09/01/2016  . Chronic diastolic heart failure ([place]) 03/28/2016  . Diaphragmatic hernia 03/28/2016  . 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 Risk for falls 03/28/2016  . Paresthesia of left lower extremity 11/11/2015  . Hoarse 12/23/2014  . History of GI bleed 05/17/2014  . Rectal bleed 05/17/2014  . Hypotension 05/17/2014  . 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 Dizziness 04/05/2014  . SSS (sick sinus syndrome) ([place]) 03/25/2014  . CAD S/P percutaneous coronary angioplasty -  03/24/2014  . Perivalvular leak of prosthetic heart valve   . Mitral stenosis   . Routine general medical examination at a health care facility 12/31/2013  . Solitary  pulmonary nodule 12/08/2013  . Pleural effusion 12/05/2013  . Dyspnea 12/05/2013  . Pacemaker - Medtronic Adapta, dual chamber 11/19/2013  . Restrictive lung disease 03/30/2013  . Encounter for long-term (current) use of other medications 08/17/2012  . BASAL CELL CARCINOMA OF SKIN SITE UNSPECIFIED 09/28/2010  . PERSONAL HX COLONIC POLYPS 01/01/2010  . BARRETTS ESOPHAGUS 03/20/2009  . Hematuria 10/21/2008  . Iron deficiency anemia 10/13/2008  . Anemia 09/18/2008  . UNSPECIFIED VENOUS INSUFFICIENCY 09/18/2008  . ANXIETY 09/04/2008  . Laceration of leg 09/04/2008  . CEREBROVASCULAR ACCIDENT, HX OF 08/18/2008  . S/P CABG x 1 06/13/2008  . S/P mitral valve replacement with bioprosthetic valve 06/13/2008  . S/P Maze operation for atrial fibrillation 06/13/2008  . PROSTATE CANCER, HX OF 01/14/2008  . CATARACTS, BILATERAL, HX OF 01/14/2008  . Dyslipidemia 06/01/2007  . Essential hypertension 06/01/2007  . GERD 06/01/2007  . Unspecified essential hypertension 06/01/2007  . CAD (coronary artery disease) 06/01/2007    Orientation RESPIRATION BLADDER Height & Weight     Self, Time, Situation, Place  O2(nasal cannula 2L) Incontinent Weight: 155 lb 13.8 oz (70.7 kg) Height:  5\' 10"  (177.8 cm)  BEHAVIORAL SYMPTOMS/MOOD NEUROLOGICAL BOWEL NUTRITION STATUS      Continent Diet(please see DC summary)  AMBULATORY STATUS COMMUNICATION OF NEEDS Skin   Extensive Assist Verbally Normal                       Personal Care Assistance Level of Assistance  Bathing, Feeding, Dressing Bathing Assistance: Limited assistance Feeding assistance: Independent Dressing Assistance: Limited assistance     Functional Limitations Info  Sight, Hearing, Speech Sight Info: Adequate Hearing Info: Impaired Speech Info: Adequate    SPECIAL CARE FACTORS FREQUENCY  PT (By licensed PT)     PT Frequency: 5x/week              Contractures Contractures Info: Not present    Additional Factors Info   Code Status, Allergies Code Status Info: DNR Allergies Info: Amiodarone           Current Medications (10/18/2017):  This is the current hospital active medication list Current Facility-Administered Medications  Medication Dose Route Frequency Provider Last Rate Last Dose  . 0.9 %  sodium chloride infusion  250 mL Intravenous PRN Kilroy, Luke K, PA-C      . acetaminophen (TYLENOL) tablet 650 mg  650 mg Oral Q4H PRN Kilroy, Luke K, PA-C      . albuterol (PROVENTIL) (2.5 MG/3ML) 0.083% nebulizer solution 2.5 mg  2.5 mg Nebulization Q6H PRN Buford Dresser, MD   2.5 mg at 10/15/17 2214  . clopidogrel (PLAVIX) tablet 75 mg  75 mg Oral Q breakfast Erlene Quan, PA-C   75 mg at 10/18/17 0809  . enoxaparin (LOVENOX) injection 40 mg  40 mg Subcutaneous Q24H Karren Cobble, RPH   40 mg at 10/17/17 1524  . feeding supplement (ENSURE ENLIVE) (ENSURE ENLIVE) liquid 237 mL  237 mL Oral TID BM [name], [name], MD      . Derrill Memo ON 10/19/2017] furosemide (LASIX) injection 40 mg  40 mg Intravenous Daily Dorothy Spark, MD      . lidocaine 20 MG/ML injection           . metoprolol succinate (TOPROL-XL) 24 hr tablet 25 mg  25 mg Oral Daily Erlene Quan, PA-C   25 mg at 10/18/17 8127  . multivitamin with minerals tablet 1 tablet  1 tablet Oral Daily Erlene Quan, PA-C   1 tablet at 10/18/17 0809  . nitroGLYCERIN (NITROSTAT) SL tablet 0.4 mg  0.4 mg Sublingual Q5 Min x 3 PRN Kilroy, Luke K, PA-C      . ondansetron [facility]) injection 4 mg  4 mg Intravenous Q6H PRN Kilroy, Luke K, PA-C      . pantoprazole (PROTONIX) EC tablet 40 mg  40 mg Oral Daily Erlene Quan, PA-C   40 mg at 10/18/17 5170  . potassium chloride SA (K-DUR,KLOR-CON) CR tablet 20 mEq  20 mEq Oral Daily Erlene Quan, PA-C   20 mEq at 10/18/17 0809  . simvastatin (ZOCOR) tablet 40 mg  40 mg Oral QHS Erlene Quan, PA-C   40 mg at 10/17/17 2127  . tamsulosin (FLOMAX) capsule 0.4 mg  0.4 mg Oral QHS Kilroy, Luke K, PA-C    0.4 mg at 10/17/17 2127     Discharge Medications: Please see discharge summary for a list of discharge medications.  Relevant Imaging Results:  Relevant Lab Results:   Additional Information SSN: [id number]  Estanislado Emms, LCSW

## 2017-10-18 NOTE — Evaluation (Signed)
Physical Therapy Evaluation Patient Details Name: [name] [name] MRN: [id number] DOB: 14-Apr-1929 Today's Date: 10/18/2017   History of Present Illness  [name] [name] is a 83 y.o. male with a history of valvular heart disease, CAF, diastolic CHF, afib, CABG, TAVR, PAD, prostate CA, HTN, admitted with acute on chronic CHF  Clinical Impression  Pt admitted with/for CHF exacerbation.  Pt needing 6 L of oxygen to maintain sats in the low to mid 90's and hovers in low 90's with minimal activity at min to mod assist.  Pt currently limited functionally due to the problems listed. ( See problems list.)   Pt will benefit from PT to maximize function and safety in order to get ready for next venue listed below.     Follow Up Recommendations SNF;Supervision/Assistance - 24 hour    Equipment Recommendations  (TBA next venue)    Recommendations for Other Services       Precautions / Restrictions Precautions Precautions: Fall Restrictions Weight Bearing Restrictions: No      Mobility  Bed Mobility Overal bed mobility: Needs Assistance Bed Mobility: Supine to Sit     Supine to sit: Min assist;Mod assist     General bed mobility comments: pt more preoccupied with breathing and struggling to come forward OOB and scoot to EOB  Transfers Overall transfer level: Needs assistance Equipment used: 1 person hand held assist Transfers: Sit to/from [facility] Sit to Stand: Min assist Stand pivot transfers: Min assist       General transfer comment: cues for hand placement, face to face assist.  Pt able to attain full upright posture, but weak pivot otherwise.  Ambulation/Gait             General Gait Details: pt did not have the reserves to try ambulation today.  Stairs            Wheelchair Mobility    Modified Rankin (Stroke Patients Only)       Balance Overall balance assessment: Needs assistance Sitting-balance support: No upper extremity  supported;Single extremity supported Sitting balance-Leahy Scale: Fair     Standing balance support: Bilateral upper extremity supported Standing balance-Leahy Scale: Poor Standing balance comment: reliant on UE assist                             Pertinent Vitals/Pain Pain Assessment: No/denies pain    Home Living                        Prior Function Level of Independence: Independent with assistive device(s)         Comments: pt states he has lately encouraged to use a RW     Hand Dominance   Dominant Hand: Right    Extremity/Trunk Assessment   Upper Extremity Assessment Upper Extremity Assessment: Defer to OT evaluation    Lower Extremity Assessment Lower Extremity Assessment: Overall WFL for tasks assessed;Generalized weakness    Cervical / Trunk Assessment Cervical / Trunk Assessment: Kyphotic  Communication   Communication: No difficulties  Cognition Arousal/Alertness: Awake/alert Behavior During Therapy: WFL for tasks assessed/performed Overall Cognitive Status: Within Functional Limits for tasks assessed(slowed processing)                                        General Comments General comments (skin integrity, edema, etc.):  On arrival, pt had his oxygen pulled off.  abnd SpO2 registering 81%, HR 80's and pt's mentation was "foggy"    Exercises     Assessment/Plan    PT Assessment Patient needs continued PT services  PT Problem List Decreased strength;Decreased activity tolerance;Decreased balance;Decreased mobility;Decreased knowledge of use of DME;Cardiopulmonary status limiting activity       PT Treatment Interventions DME instruction;Gait training;Functional mobility training;Therapeutic activities;Balance training;Patient/family education    PT Goals (Current goals can be found in the Care Plan section)  Acute Rehab PT Goals Patient Stated Goal: get back home, breathing better PT Goal Formulation: With  patient Time For Goal Achievement: 11/01/17 Potential to Achieve Goals: Fair    Frequency Min 3X/week   Barriers to discharge        Co-evaluation               AM-PAC PT "6 Clicks" Daily Activity  Outcome Measure Difficulty turning over in bed (including adjusting bedclothes, sheets and blankets)?: A Little Difficulty moving from lying on back to sitting on the side of the bed? : Unable Difficulty sitting down on and standing up from a chair with arms (e.g., wheelchair, bedside commode, etc,.)?: Unable Help needed moving to and from a bed to chair (including a wheelchair)?: A Little Help needed walking in hospital room?: A Lot Help needed climbing 3-5 steps with a railing? : A Lot 6 Click Score: 12    End of Session   Activity Tolerance: Patient limited by fatigue Patient left: in chair;with call bell/phone within reach;with chair alarm set Nurse Communication: Mobility status PT Visit Diagnosis: Unsteadiness on feet (R26.81);Other abnormalities of gait and mobility (R26.89)    Time: 1245-8099 PT Time Calculation (min) (ACUTE ONLY): 33 min   Charges:   PT Evaluation $PT Eval Moderate Complexity: 1 Mod PT Treatments $Therapeutic Activity: 8-22 mins   PT G Codes:        11-04-17  Donnella Sham, PT [phone]  (pager)  Tessie Fass Clements Toro 2017-11-04, 11:37 AM

## 2017-10-18 NOTE — Procedures (Signed)
PROCEDURE SUMMARY:  Successful US guided right therapeutic thoracentesis. Yielded 2.0 liters of red-colored fluid. Pt tolerated procedure well. No immediate complications.  Specimen was not sent for labs. CXR ordered.  Docia Barrier PA-C 10/18/2017 12:58 PM

## 2017-10-18 NOTE — Clinical Social Work Note (Signed)
Clinical Social Work Assessment  Patient Details  Name: [name] [name] MRN: [id number] Date of Birth: Nov 22, 1928  Date of referral:  10/18/17               Reason for consult:  Facility Placement                Permission sought to share information with:  Facility Sport and exercise psychologist, Family Supports Permission granted to share information::  Yes, Verbal Permission Granted  Name::     Wofford Stratton  Agency::  [facility]  Relationship::  spouse  Contact Information:  [phone]  Housing/Transportation Living arrangements for the past 2 months:  [place] of Information:  Patient, Facility Patient Interpreter Needed:  None Criminal Activity/Legal Involvement Pertinent to Current Situation/Hospitalization:  No - Comment as needed Significant Relationships:  Spouse Lives with:  Spouse Do you feel safe going back to the place where you live?  Yes Need for family participation in patient care:  Yes (Comment)  Care giving concerns: Patient from [place]. PT recommending SNF.   Social Worker assessment / plan: CSW met with patient at bedside; patient alert and oriented. PT was finishing up their evaluation and indicated recommendation would be for SNF. Patient indicated he is agreeable to SNF and prefers the SNF portion of facility at [facility]. CSW called [facility] and confirmed they have bed for patient. CSW started patient's authorization for SNF with [facility]; awaiting determination. CSW to follow and support with discharge.  Employment status:    Insurance information:  Health and safety inspector) PT Recommendations:  [place] / Referral to community resources:  [place]  Patient/Family's Response to care:  Patient appreciative of care.  Patient/Family's Understanding of and Emotional Response to Diagnosis, Current Treatment, and Prognosis: Patient with good understanding of  condition and agreeable to rehab at [facility].  Emotional Assessment Appearance:  Appears stated age Attitude/Demeanor/Rapport:  Other(appropriate) Affect (typically observed):  Calm, Pleasant Orientation:  Oriented to Self, Oriented to Place, Oriented to  Time, Oriented to Situation Alcohol / Substance use:  Not Applicable Psych involvement (Current and /or in the community):  No (Comment)  Discharge Needs  Concerns to be addressed:  Discharge Planning Concerns Readmission within the last 30 days:  No Current discharge risk:  Physical Impairment Barriers to Discharge:  Continued Medical Work up   Estanislado Emms, LCSW 10/18/2017, 1:39 PM

## 2017-10-18 NOTE — Progress Notes (Signed)
Initial Nutrition Assessment  DOCUMENTATION CODES:   Severe malnutrition in context of chronic illness  INTERVENTION:    Ensure Enlive po TID, each supplement provides 350 kcal and 20 grams of protein  NUTRITION DIAGNOSIS:   Severe Malnutrition related to chronic illness(CHF) as evidenced by severe muscle depletion, severe fat depletion.  GOAL:   Patient will meet greater than or equal to 90% of their needs  MONITOR:   PO intake, Supplement acceptance  REASON FOR ASSESSMENT:   Malnutrition Screening Tool    ASSESSMENT:   83 yo male with PMH of colon polyps, HLD, anemia, HTN, venous insufficiency, GERD, Barrett's esophagus, CVA, basal cell skin cancer, CAD, CHF, pacemaker who was admitted on 12/9 with SOB.  Patient SOB during RD visit. He reports that he has lost weight. Usual weight 150 lbs, currently 5 lbs above usual weight, likely related to fluid retention. Per review of weights from the past year, he has had some weight fluctuations related to fluid shifts with CHF. He has been eating poorly. Likes ice water and chocolate Ensure.   Labs and medications reviewed.   NUTRITION - FOCUSED PHYSICAL EXAM:    Most Recent Value  Orbital Region  No depletion  Upper Arm Region  Severe depletion  Thoracic and Lumbar Region  Severe depletion  Buccal Region  Severe depletion  Temple Region  Severe depletion  Clavicle Bone Region  Severe depletion  Clavicle and Acromion Bone Region  Severe depletion  Scapular Bone Region  Severe depletion  Dorsal Hand  Moderate depletion  Patellar Region  Moderate depletion  Anterior Thigh Region  Moderate depletion  Posterior Calf Region  Unable to assess  Edema (RD Assessment)  Unable to assess  Hair  Reviewed  Eyes  Reviewed  Mouth  Reviewed  Skin  Reviewed  Nails  Reviewed       Diet Order:  Diet NPO time specified  EDUCATION NEEDS:   No education needs have been identified at this time  Skin:  Skin Assessment: Skin  Integrity Issues: Skin Integrity Issues:: Other (Comment) Other: flaky, cracking skin to bilateral LE  Last BM:  12/11  Height:   Ht Readings from Last 1 Encounters:  10/17/17 5\' 10"  (1.778 m)    Weight:   Wt Readings from Last 1 Encounters:  10/18/17 155 lb 13.8 oz (70.7 kg)    Ideal Body Weight:  75.5 kg  BMI:  Body mass index is 22.36 kg/m.  Estimated Nutritional Needs:   Kcal:  6568-1275  Protein:  85-100 gm  Fluid:  1.8-2 L   [name] [name], RD, LDN, [place] Pager [phone] After Hours Pager [phone]

## 2017-10-19 ENCOUNTER — Encounter (HOSPITAL_COMMUNITY): Payer: Self-pay | Admitting: Physician Assistant

## 2017-10-19 ENCOUNTER — Telehealth: Payer: Self-pay | Admitting: *Deleted

## 2017-10-19 DIAGNOSIS — I2510 Atherosclerotic heart disease of native coronary artery without angina pectoris: Secondary | ICD-10-CM

## 2017-10-19 DIAGNOSIS — I214 Non-ST elevation (NSTEMI) myocardial infarction: Secondary | ICD-10-CM

## 2017-10-19 DIAGNOSIS — E43 Unspecified severe protein-calorie malnutrition: Secondary | ICD-10-CM

## 2017-10-19 DIAGNOSIS — R0902 Hypoxemia: Secondary | ICD-10-CM

## 2017-10-19 DIAGNOSIS — I272 Pulmonary hypertension, unspecified: Secondary | ICD-10-CM

## 2017-10-19 LAB — BASIC METABOLIC PANEL
ANION GAP: 5 (ref 5–15)
BUN: 22 mg/dL — ABNORMAL HIGH (ref 6–20)
CALCIUM: 8.8 mg/dL — AB (ref 8.9–10.3)
CHLORIDE: 94 mmol/L — AB (ref 101–111)
CO2: 45 mmol/L — AB (ref 22–32)
Creatinine, Ser: 0.78 mg/dL (ref 0.61–1.24)
GLUCOSE: 111 mg/dL — AB (ref 65–99)
Potassium: 3.9 mmol/L (ref 3.5–5.1)
Sodium: 144 mmol/L (ref 135–145)

## 2017-10-19 LAB — BRAIN NATRIURETIC PEPTIDE: B Natriuretic Peptide: 428.1 pg/mL — ABNORMAL HIGH (ref 0.0–100.0)

## 2017-10-19 MED ORDER — ENSURE ENLIVE PO LIQD: 237.0000 mL | Freq: Three times a day (TID) | ORAL | 0 refills | Status: AC

## 2017-10-19 MED ORDER — ENSURE ENLIVE PO LIQD: 237.0000 mL | Freq: Three times a day (TID) | ORAL | Status: DC

## 2017-10-19 MED ORDER — FUROSEMIDE 40 MG PO TABS: 40.0000 mg | ORAL_TABLET | Freq: Every day | ORAL | 2 refills | Status: AC

## 2017-10-19 NOTE — Telephone Encounter (Signed)
-----   Message from Charlie Pitter, [place] sent at 10/19/2017  3:03 PM EST ----- Regarding: will need TOC phone call Wannetta Sender is helping me arrange appt, but can you make sure this patient gets a TOC call? Leaving to go to SNF today. Thanks! Dayna Dunn PA-C

## 2017-10-19 NOTE — Clinical Social Work Placement (Signed)
   CLINICAL SOCIAL WORK PLACEMENT  NOTE  Date:  10/19/2017  Patient Details  Name: [name] [name] MRN: [id number] Date of Birth: 09/13/1929  Clinical Social Work is seeking post-discharge placement for this patient at the [place] level of care (*CSW will initial, date and re-position this form in  chart as items are completed):  Yes   Patient/family provided with [place] Work Department's list of facilities offering this level of care within the geographic area requested by the patient (or if unable, by the patient's family).  Yes   Patient/family informed of their freedom to choose among providers that offer the needed level of care, that participate in Medicare, Medicaid or managed care program needed by the patient, have an available bed and are willing to accept the patient.  Yes   Patient/family informed of [facility]'s ownership interest in [facility] and [facility], as well as of the fact that they are under no obligation to receive care at these facilities.  PASRR submitted to EDS on       PASRR number received on       Existing PASRR number confirmed on 10/18/17     FL2 transmitted to all facilities in geographic area requested by pt/family on 10/18/17     FL2 transmitted to all facilities within larger geographic area on       Patient informed that his/her managed care company has contracts with or will negotiate with certain facilities, including the following:  [facility] informed of bed offers received.  Patient chooses bed at [facility]     Physician recommends and patient chooses bed at      Patient to be transferred to [facility] on 10/19/17.  Patient to be transferred to facility by [facility]     Patient family notified on 10/19/17 of transfer.  Name of family member notified:  Wofford Stratton, spouse     PHYSICIAN Please prepare priority  discharge summary, including medications, Please prepare prescriptions, Please sign DNR     Additional Comment:    _______________________________________________ Estanislado Emms, LCSW 10/19/2017, 11:31 AM

## 2017-10-19 NOTE — Progress Notes (Signed)
SATURATION QUALIFICATIONS: (This note is used to comply with regulatory documentation for home oxygen)  Patient Saturations on Room Air at Rest = 90%  Patient Saturations on Room Air while Ambulating = 83%  Patient Saturations on 2 Liters of oxygen while Ambulating = 94%  Please briefly explain why patient needs home oxygen: Unable to maintained O2 saturation while ambulating

## 2017-10-19 NOTE — Progress Notes (Signed)
Discharge summary reviewed with pt. No change since previous assessment. All questions answered and pt expresses no concerns at this time. PIV removed. Report called to [facility]. Pt awaiting transport via [facility] for discharge. Oncoming RN updated.

## 2017-10-19 NOTE — Evaluation (Signed)
Occupational Therapy Evaluation Patient Details Name: [name] [name] MRN: [id number] DOB: Aug 24, 1929 Today's Date: 10/19/2017    History of Present Illness [name] [name] is a 83 y.o. male with a history of valvular heart disease, CAF, diastolic CHF, afib, CABG, TAVR, PAD, prostate CA, HTN, admitted with acute on chronic CHF   Clinical Impression   Pt reports he walks without a device, drives and is independent in self care. He is assisted for housekeeping and receives some meals from the dining room at [facility], where he resides in a duplex. Pt presents with decreased activity tolerance, poor standing balance and decreased awareness of deficits. Sp02 down to 83% with ambulation on RA, returned 2L with rebound to 94%. Pt is in agreement that he will benefit from further rehab prior to return home. Recommending SNF when medically ready. Will follow acutely.   Follow Up Recommendations  SNF;Supervision/Assistance - 24 hour    Equipment Recommendations       Recommendations for Other Services       Precautions / Restrictions Precautions Precautions: Fall Restrictions Weight Bearing Restrictions: No      Mobility Bed Mobility               General bed mobility comments: pt received in chair  Transfers Overall transfer level: Needs assistance Equipment used: 1 person hand held assist Transfers: Sit to/from Stand Sit to Stand: Min assist Stand pivot transfers: Min guard       General transfer comment: min guard from chair    Balance Overall balance assessment: Needs assistance   Sitting balance-Leahy Scale: Fair     Standing balance support: No upper extremity supported Standing balance-Leahy Scale: Poor Standing balance comment: pt reaching for therapist's hand when ambulating, but yet declining use of RW                           ADL either performed or assessed with clinical judgement   ADL Overall ADL's : Needs  assistance/impaired Eating/Feeding: Independent;Sitting   Grooming: Standing;Min guard   Upper Body Bathing: Minimal assistance;Sitting   Lower Body Bathing: Minimal assistance;Sit to/from stand   Upper Body Dressing : Set up;Sitting   Lower Body Dressing: Minimal assistance;Sit to/from stand   Toilet Transfer: Minimal assistance;Ambulation   Toileting- Clothing Manipulation and Hygiene: Minimal assistance;Sit to/from stand       Functional mobility during ADLs: Minimal assistance       Vision Baseline Vision/History: No visual deficits       Perception     Praxis      Pertinent Vitals/Pain Pain Assessment: No/denies pain     Hand Dominance Right   Extremity/Trunk Assessment Upper Extremity Assessment Upper Extremity Assessment: Generalized weakness   Lower Extremity Assessment Lower Extremity Assessment: Defer to PT evaluation   Cervical / Trunk Assessment Cervical / Trunk Assessment: Kyphotic   Communication Communication Communication: HOH   Cognition Arousal/Alertness: Awake/alert Behavior During Therapy: WFL for tasks assessed/performed Overall Cognitive Status: Impaired/Different from baseline Area of Impairment: Safety/judgement                         Safety/Judgement: Decreased awareness of deficits         General Comments       Exercises     Shoulder Instructions      Home Living Family/patient expects to be discharged to:: Skilled nursing facility  Prior Functioning/Environment Level of Independence: Independent with assistive device(s)        Comments: pt lives at [place] with wife, goes for some meals to main building, drives        OT Problem List: Impaired balance (sitting and/or standing);Decreased activity tolerance;Decreased strength;Decreased knowledge of use of DME or AE;Decreased safety awareness      OT Treatment/Interventions:  Self-care/ADL training;DME and/or AE instruction;Energy conservation;Patient/family education;Balance training    OT Goals(Current goals can be found in the care plan section) Acute Rehab OT Goals Patient Stated Goal: get back home, breathing better OT Goal Formulation: With patient Time For Goal Achievement: 11/02/17 Potential to Achieve Goals: Good  OT Frequency: Min 2X/week   Barriers to D/C:            Co-evaluation PT/OT/SLP Co-Evaluation/Treatment: Yes Reason for Co-Treatment: For patient/therapist safety   OT goals addressed during session: ADL's and self-care      AM-PAC PT "6 Clicks" Daily Activity     Outcome Measure Help from another person eating meals?: None Help from another person taking care of personal grooming?: A Little Help from another person toileting, which includes using toliet, bedpan, or urinal?: A Little Help from another person bathing (including washing, rinsing, drying)?: A Little Help from another person to put on and taking off regular upper body clothing?: A Little Help from another person to put on and taking off regular lower body clothing?: A Little 6 Click Score: 19   End of Session Equipment Utilized During Treatment: Gait belt;Oxygen  Activity Tolerance: Patient limited by fatigue Patient left: in chair;with call bell/phone within reach;with chair alarm set  OT Visit Diagnosis: Unsteadiness on feet (R26.81);Other abnormalities of gait and mobility (R26.89);Muscle weakness (generalized) (M62.81)                Time: 3614-4315 OT Time Calculation (min): 30 min Charges:  OT General Charges $OT Visit: 1 Visit OT Evaluation $OT Eval Moderate Complexity: 1 Mod G-Codes:     October 24, 2017 Nestor Lewandowsky, OTR/L Pager: [phone]  Keiosha Cancro, Haze Boyden 2017-10-24, 2:27 PM

## 2017-10-19 NOTE — Telephone Encounter (Signed)
Dunn, Nedra Hai, PA-C  P Cv Div Ch St Triage        Trish is helping me arrange appt, but can you make sure this patient gets a TOC call? Leaving to go to SNF today. Thanks!  Dayna Dunn PA-C

## 2017-10-19 NOTE — Discharge Summary (Signed)
Discharge Summary    Patient ID: [name] [name],  MRN: [id number], DOB/AGE: [birth year] 83 y.o.  Admit date: 10/15/2017 Discharge date: 10/19/2017  Primary Care Provider: [name] Primary Cardiologist: Dr. 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 There was mild   concentric hypertrophy. Systolic function was normal. The   estimated ejection fraction was in the range of 50% to 55%. Mild   hypokinesis of the anteroseptal and inferoseptal myocardium. The   study is not technically sufficient to allow evaluation of LV   diastolic function. - Aortic valve: A bioprosthesis was present. Transvalvular velocity   was within the normal range. There was no stenosis. There was   mild regurgitation. Peak velocity (S): 200 cm/s. - Mitral valve: A bioprosthesis was present. Pressure half-time:   130 ms. Mean gradient (D): 8 mm Hg. - Left atrium: The atrium was severely dilated. - Right ventricle: The cavity size was normal. Wall thickness was   normal. Systolic function was normal. - Tricuspid valve: There was moderate regurgitation. - Pulmonic valve: There was moderate regurgitation. - Pulmonary arteries: Systolic pressure was severely increased. PA   peak pressure: 68 mm Hg (S). Impressions: - Mean gradient across the  bioprosthetic mitral valve has increased   from 4 mmHg to 8 mmHg since 06/2017. _____________     History of Present Illness     [name] [name] is a 83 y.o. 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 with acute on chronic diastolic CHF.  He has had issues with CHF in the setting of skipping Lasix as it would interfere with activities, and he has preferred to remain active. He was seen in the office 09/11/17 at 152lb. We decided his goal wgt would be between 150-154 lbs. He was given instructions on how to take his Lasix. Unfortunately he had not complied with instructions and developed worsening dyspnea on exertion, prompting ED visit from his ALF. Initial workup in the ED revealed BNP 635, troponin of 0.7, normal Cr, Hgb 10.6 (baseline earlier this year 10-12). EKG showed atrial fib similar to prior. CXR showed edema versus consolidation, right pleural effusion, and other stable signs of CHF.  Hospital Course    1. Acute on chronic diastolic CHF with right pleural effusion - his above findings were felt consistent with volume overload so he was admitted for diuresis with IV Lasix. 2D echo 10/16/17 showed EF 50-55% with mild hypokinesis of the anteroseptal and inferoseptal myocardium, mild aortic regurgitation, increase in mean  gradient across bioprosthetic MVR to 49mmHg, severe LA, moderate TR, moderate PR, severe increase in pulmonary pressures to 36mmHg (see commentary below). With increase in Lasix, he did bump his CO2 to 45 indicating contraction alkalosis. However, given residual pleural effusion he was felt to benefit  from thoracentesis. He underwent this on 12/12 yielding 2L of red-colored fluid, specimen not sent for labs. Today he feels like he has more energy. With PT/OT assessment he is felt to require SNF at discharge as he is currently limited functionally. PT felt benefit from PT to maximize function and safety. OT did a final assessment on the pt who noted he desaturated to 83% on ambulation with RA, returned to 94% on 2L. He remained on O2 at rest for the majority of his hospitalization at 2L to maintain O2 sats and will therefore need frequent reassessment at nursing home for oxygen requirements. His discharge weight is currently 147lb. Dr. Meda Coffee recommends to switch to Lasix 40mg  daily. Although his admission med rec suggested dosing of 40mg  alternating with 80mg  daily it was clear the patient was not previously compliant with this. Discharge Cr is 0.78.  2. Permanent atrial fibrillation - rate remained relatively controlled this admission, occasional PCs. Not on anticoagulation secondary to history of GI bleeding and anemia  3. CAD with elevated troponin - troponins were trended with peak value of Istat of 0.70, subsequently 0.34-0.29-0.29, felt to be due to demand ischemia, no invasive evaluation recommended. He was continued on aspirin and Plavix.  4. Valvular disease with h/o MR/MS s/p bioprosthetic valve in 2009 and TAVR in 2015- echo as outlined above. Dr. Meda Coffee felt the patient was not a surgical candidate and not TMVR candidate. She felt he appears cachectic and is non mobile, therefore only option long term may end up being hospice care.  5. Severe malnutrition in context of chronic illness - nutrition team recommended TID Ensure.  6. SSS s/p Pacemaker - Medtronic Adapta, dual chamber - Pacemaker dependent, 100% pacing in the atrium and ventricle.  7. Hypoxia - possible resultant chronic respiratory failure - see #1. This may represent need for long-term oxygen.  Dr. Meda Coffee has seen and  examined the patient and feels he is stable for discharge to SNF. She has reviewed code status with patient and he remains DNR. _____________  Discharge Vitals Blood pressure 127/63, pulse 93, temperature 97.6 F (36.4 C), temperature source Axillary, resp. rate 20, height 5\' 10"  (1.778 m), weight 147 lb 7.8 oz (66.9 kg), SpO2 96 %.  Filed Weights   10/17/17 0331 10/18/17 0457 10/19/17 0435  Weight: 154 lb 12.2 oz (70.2 kg) 155 lb 13.8 oz (70.7 kg) 147 lb 7.8 oz (66.9 kg)    Labs & Radiologic Studies    Basic Metabolic Panel Recent Labs    10/18/17 0439 10/19/17 0509  NA 143 144  K 3.7 3.9  CL 95* 94*  CO2 42* 45*  GLUCOSE 106* 111*  BUN 21* 22*  CREATININE 0.85 0.78  CALCIUM 8.4* 8.8*   Cardiac Enzymes Recent Labs    10/16/17 1540 10/16/17 2017  TROPONINI 0.29* 0.29*   _____________  Dg Chest 1 View  Result Date: 10/18/2017 CLINICAL DATA:  Post thoracentesis EXAM: CHEST 1 VIEW COMPARISON:  Chest radiograph October 17, 2017 FINDINGS: Note that a portion of the left apex is not seen due to patient's mandible overlying this area. No pneumothorax appreciable. Right pleural effusion is smaller compared to 1 day prior. There is a new left pleural effusion, however.  There is bibasilar consolidation, more on the left than on the right. There is cardiomegaly with pacemaker leads attached to the right atrium and right ventricle. There is an aortic valve replacement. Patient is status post median sternotomy. There is aortic atherosclerosis. No adenopathy evident. Bones are osteoporotic. IMPRESSION: Right pleural effusion smaller. No pneumothorax. New left pleural effusion. There is bibasilar consolidation. Stable cardiomegaly. There is aortic atherosclerosis. Aortic Atherosclerosis (ICD10-I70.0). Electronically Signed   By: Lowella Grip III M.D.   On: 10/18/2017 13:11   Dg Chest 2 View  Result Date: 10/17/2017 CLINICAL DATA:  Shortness of Breath EXAM: CHEST  2 VIEW COMPARISON:   10/15/2017 FINDINGS: Moderate right pleural effusion again noted. There is cardiomegaly with bilateral perihilar and lower lobe airspace opacities, favor edema/ CHF. Prior aortic valve repair. Left pacer is unchanged. IMPRESSION: Continued CHF pattern.  Moderate right pleural effusion. Electronically Signed   By: Rolm Baptise M.D.   On: 10/17/2017 09:43   Dg Chest Port 1 View  Result Date: 10/15/2017 CLINICAL DATA:  83 year old male with increased shortness of breath. EXAM: PORTABLE CHEST 1 VIEW COMPARISON:  06/25/2017 FINDINGS: Stable do lead left-sided AICD device. The patient is status post median sternotomy Ann T AVR. Cardiomediastinal silhouette is enlarged but stable. There has been interval increase of patchy opacity in the left mid lung. Right basilar effusion with associated atelectasis, with or without superimposed consolidation. This is stable from prior study. No pneumothorax. No acute osseous abnormalities. IMPRESSION: 1. Increasing left mid lung patchy opacity, focal edema versus consolidation. 2. Continued right pleural effusion is study stated atelectasis, with or without superimposed consolidation. 3. Other stable signs of CHF including cardiomegaly and vascular congestion. Electronically Signed   By: Kristopher Oppenheim M.D.   On: 10/15/2017 09:39   Ir Thoracentesis Asp Pleural Space W/img Guide  Result Date: 10/18/2017 INDICATION: Patient with right pleural effusion. Request is made for therapeutic thoracentesis. EXAM: ULTRASOUND GUIDED THERAPEUTIC RIGHT THORACENTESIS MEDICATIONS: 10 mL 2% lidocaine COMPLICATIONS: None immediate. PROCEDURE: An ultrasound guided thoracentesis was thoroughly discussed with the patient and questions answered. The benefits, risks, alternatives and complications were also discussed. The patient understands and wishes to proceed with the procedure. Written consent was obtained. Ultrasound was performed to localize and mark an adequate pocket of fluid in the right  chest. The area was then prepped and draped in the normal sterile fashion. 2% Lidocaine was used for local anesthesia. Under ultrasound guidance a Safe-T-Centesis catheter was introduced. Thoracentesis was performed. The catheter was removed and a dressing applied. FINDINGS: A total of approximately 2.0 liters of amber fluid was removed. IMPRESSION: Successful ultrasound guided right therapeutic thoracentesis yielding 2.0 liters of pleural fluid. Read by:  Brynda Greathouse PA-C Electronically Signed   By: Markus Daft M.D.   On: 10/18/2017 15:34   Disposition   Pt is being discharged home today in good condition.  Follow-up Plans & Appointments     Contact information for follow-up providers    Erlene Quan, PA-C Follow up.   Specialties:  Cardiology, Radiology Why:  [facility] - see appointment listed - 10/26/17 at [place] 15 minutes prior to appointment to check in. Contact information: [address] [place] [address] [phone]            Contact information for after-discharge care    Destination    [facility] SNF/ALF Follow up.   Service:  Skilled Nursing Contact information: [place]  [phone]                 Discharge Instructions    Diet - low sodium heart healthy   Complete by:  As directed    Increase activity slowly   Complete by:  As directed    For patients with congestive heart failure, we give them these special instructions:   1. Follow a low-salt diet - you are allowed no more than 2,000mg  of sodium per day. Watch your fluid intake. In general, you should not be taking in more than 2 liters of fluid per day (no more than 8 glasses per day). This includes sources of water in foods like soup, coffee, tea, milk, etc.  2. Please make sure patient is weighed DAILY on the same scale at same time of day and keep a log.  3. Please CALL THE OFFICE if patient develops: -  3lb weight gain overnight or 5lb within a few days - Shortness of breath, with or without a dry hacking cough  - Swelling in the hands, feet or stomach  - If patient has to sleep on extra pillows at night in order to breathe   IT IS IMPORTANT TO LET us KNOW EARLY ON IF YOU ARE HAVING SYMPTOMS SO WE CAN HELP YOU!      Discharge Medications   Allergies as of 10/19/2017      Reactions   Amiodarone    Thyroid disorder      Medication List    TAKE these medications   clopidogrel 75 MG tablet Commonly known as:  PLAVIX Take 1 tablet (75 mg total) by mouth daily with breakfast.   feeding supplement (ENSURE ENLIVE) Liqd Take 237 mLs by mouth 3 (three) times daily between meals.   furosemide 40 MG tablet Commonly known as:  LASIX Take 1 tablet (40 mg total) by mouth daily. What changed:    how much to take  how to take this  when to take this  additional instructions   metoprolol succinate 25 MG 24 hr tablet Commonly known as:  TOPROL XL Take 1 tablet (25 mg total) by mouth daily.   multivitamin with minerals Tabs tablet Take 1 tablet by mouth daily. Centrum Silver   pantoprazole 40 MG tablet Commonly known as:  PROTONIX Take 1 tablet (40 mg total) by mouth daily.   potassium chloride SA 20 MEQ tablet Commonly known as:  K-DUR,KLOR-CON Take 20 mEq by mouth daily.   simvastatin 40 MG tablet Commonly known as:  ZOCOR Take 1 tablet (40 mg total) by mouth at bedtime.   tamsulosin 0.4 MG Caps capsule Commonly known as:  FLOMAX Take 0.4 mg by mouth at bedtime.   triamcinolone 0.025 % cream Commonly known as:  KENALOG Apply 1 application topically 3 (three) times daily as needed. For rash        Allergies:  Allergies  Allergen Reactions  . Amiodarone     Thyroid disorder     Outstanding Labs/Studies   N/A  Duration of Discharge Encounter   Greater than 30 minutes including physician time.  Signed, Charlie Pitter PA-C 10/19/2017, 3:19  PM

## 2017-10-19 NOTE — Progress Notes (Signed)
Physical Therapy Treatment Patient Details Name: [name] [name] MRN: [id number] DOB: 11-18-1928 Today's Date: 10/19/2017    History of Present Illness DRAYCEN LEICHTER is a 83 y.o. male with a history of valvular heart disease, CAF, diastolic CHF, afib, CABG, TAVR, PAD, prostate CA, HTN, admitted with acute on chronic CHF    PT Comments     pt limited by fatigue, HR 90--100--93; RR 25--33--26;Pt Sp02 down to 83% with ambulation on RA, returned 2L with rebound to 94%. Pt is in agreement that he will benefit from further rehab prior to return home. Continue to recommend SNF when medically ready  Follow Up Recommendations  SNF;Supervision/Assistance - 24 hour     Equipment Recommendations  None recommended by PT    Recommendations for Other Services       Precautions / Restrictions Precautions Precautions: Fall Restrictions Weight Bearing Restrictions: No    Mobility  Bed Mobility               General bed mobility comments: pt received in chair  Transfers Overall transfer level: Needs assistance Equipment used: 1 person hand held assist Transfers: Sit to/from Stand Sit to Stand: Min assist Stand pivot transfers: Min guard       General transfer comment: assist to control descent  Ambulation/Gait Ambulation/Gait assistance: Min assist Ambulation Distance (Feet): 12 Feet Assistive device: 1 person hand held assist Gait Pattern/deviations: Step-through pattern;Decreased stride length;Shuffle     General Gait Details: pt denies dyspnea but with SpO2 down to  83% on RA, appears dyspneic and pt stated he needed to sit; O2 replaced at 2L and pt recovered to 94%; pt is unsteady and reliant on HHA  and external support at times to maintain balance;   Stairs            Wheelchair Mobility    Modified Rankin (Stroke Patients Only)       Balance Overall balance assessment: Needs assistance   Sitting balance-Leahy Scale: Fair     Standing balance  support: Single extremity supported;During functional activity Standing balance-Leahy Scale: Poor Standing balance comment: pt reaching for therapist's hand when ambulating, but yet declining use of RW                            Cognition Arousal/Alertness: Awake/alert Behavior During Therapy: WFL for tasks assessed/performed Overall Cognitive Status: Impaired/Different from baseline Area of Impairment: Safety/judgement                         Safety/Judgement: Decreased awareness of deficits            Exercises      General Comments        Pertinent Vitals/Pain Pain Assessment: No/denies pain    Home Living Family/patient expects to be discharged to:: Skilled nursing facility                    Prior Function Level of Independence: Independent with assistive device(s)      Comments: pt lives at [facility] with wife, goes for some meals to main building, drives   PT Goals (current goals can now be found in the care plan section) Acute Rehab PT Goals Patient Stated Goal: get back home, breathing better PT Goal Formulation: With patient Time For Goal Achievement: 11/01/17 Potential to Achieve Goals: Fair Progress towards PT goals: Progressing toward goals    Frequency  Min 2X/week      PT Plan Current plan remains appropriate;Frequency needs to be updated    Co-evaluation PT/OT/SLP Co-Evaluation/Treatment: Yes Reason for Co-Treatment: For patient/therapist safety PT goals addressed during session: Mobility/safety with mobility;Balance OT goals addressed during session: ADL's and self-care      AM-PAC PT "6 Clicks" Daily Activity  Outcome Measure  Difficulty turning over in bed (including adjusting bedclothes, sheets and blankets)?: A Little Difficulty moving from lying on back to sitting on the side of the bed? : Unable Difficulty sitting down on and standing up from a chair with arms (e.g., wheelchair, bedside  commode, etc,.)?: Unable Help needed moving to and from a bed to chair (including a wheelchair)?: A Little Help needed walking in hospital room?: A Lot Help needed climbing 3-5 steps with a railing? : A Lot 6 Click Score: 12    End of Session Equipment Utilized During Treatment: Oxygen Activity Tolerance: Patient limited by fatigue Patient left: in chair;with call bell/phone within reach;with chair alarm set Nurse Communication: Mobility status PT Visit Diagnosis: Unsteadiness on feet (R26.81);Other abnormalities of gait and mobility (R26.89)     Time: 8891-6945 PT Time Calculation (min) (ACUTE ONLY): 20 min  Charges:  $Gait Training: 8-22 mins                    G CodesKenyon Ana, PT Pager: [phone] 10/19/2017    Kenyon Ana 10/19/2017, 3:03 PM

## 2017-10-19 NOTE — Progress Notes (Signed)
Patient will discharge to [facility]. Anticipated discharge date: 10/19/17 Family notified: Torez Beauregard, spouse Transportation by: Corey Harold  Nurse to call report to [phone].   CSW signing off.  Estanislado Emms, [place]  Clinical Social Worker

## 2017-10-19 NOTE — Progress Notes (Signed)
Progress Note  Patient Name: [name] [name] Date of Encounter: 10/19/2017  Primary Cardiologist: Sanda Klein, MD   Subjective   He feels better today, has more energy today.  Inpatient Medications    Scheduled Meds: . clopidogrel  75 mg Oral Q breakfast  . enoxaparin (LOVENOX) injection  40 mg Subcutaneous Q24H  . feeding supplement (ENSURE ENLIVE)  237 mL Oral TID BM  . furosemide  40 mg Intravenous Daily  . metoprolol succinate  25 mg Oral Daily  . multivitamin with minerals  1 tablet Oral Daily  . pantoprazole  40 mg Oral Daily  . potassium chloride SA  20 mEq Oral Daily  . simvastatin  40 mg Oral QHS  . tamsulosin  0.4 mg Oral QHS   Continuous Infusions: . sodium chloride     PRN Meds: sodium chloride, acetaminophen, albuterol, nitroGLYCERIN, ondansetron (ZOFRAN) IV   Vital Signs    Vitals:   10/19/17 0024 10/19/17 0435 10/19/17 0852 10/19/17 0937  BP: 128/66 102/82 121/77   Pulse: 86 78 90   Resp: (!) 25 (!) 22 (!) 37   Temp: 97.7 F (36.5 C) 97.9 F (36.6 C)  98.1 F (36.7 C)  TempSrc: Axillary Axillary  Oral  SpO2: 92% (!) 88% 97%   Weight:  147 lb 7.8 oz (66.9 kg)    Height:        Intake/Output Summary (Last 24 hours) at 10/19/2017 0943 Last data filed at 10/19/2017 0600 Gross per 24 hour  Intake 480 ml  Output 725 ml  Net -245 ml   Filed Weights   10/17/17 0331 10/18/17 0457 10/19/17 0435  Weight: 154 lb 12.2 oz (70.2 kg) 155 lb 13.8 oz (70.7 kg) 147 lb 7.8 oz (66.9 kg)    Telemetry    AF, CVR, PVCs - Personally Reviewed  ECG     AF, poor ant RW, diffuse ST depression- Personally Reviewed  Physical Exam   GEN: On O2, SOB with conversation Neck: No JVD Cardiac: irreg irreg, soft systolic murmur AOV, LSB Respiratory: severe kyphosis, crackles Lt base, decreased BS 1/2 up on Rt GI: Soft, nontender, non-distended  MS: No edema; No deformity. Chronic skin changes both LE Neuro:  Nonfocal  Psych: Normal affect   Labs      Chemistry Recent Labs  Lab 10/15/17 0941  10/17/17 0434 10/18/17 0439 10/19/17 0509  NA 145   < > 142 143 144  K 3.9   < > 3.7 3.7 3.9  CL 104   < > 97* 95* 94*  CO2 36*   < > 38* 42* 45*  GLUCOSE 96   < > 121* 106* 111*  BUN 19   < > 21* 21* 22*  CREATININE 0.90   < > 0.93 0.85 0.78  CALCIUM 8.6*   < > 8.4* 8.4* 8.8*  PROT 5.9*  --   --   --   --   ALBUMIN 3.2*  --   --   --   --   AST 25  --   --   --   --   ALT 16*  --   --   --   --   ALKPHOS 75  --   --   --   --   BILITOT 0.6  --   --   --   --   GFRNONAA >60   < > >60 >60 >60  GFRAA >60   < > >60 >60 >60  ANIONGAP 5   < >  7 6 5    < > = values in this interval not displayed.     Hematology Recent Labs  Lab 10/15/17 0941 10/15/17 0958 10/16/17 0328  WBC 4.9  --  5.0  RBC 3.87*  --  3.74*  HGB 10.6* 11.2* 10.0*  HCT 35.2* 33.0* 34.3*  MCV 91.0  --  91.7  MCH 27.4  --  26.7  MCHC 30.1  --  29.2*  RDW 15.4  --  15.4  PLT 149*  --  136*    Cardiac Enzymes Recent Labs  Lab 10/16/17 0909 10/16/17 1540 10/16/17 2017  TROPONINI 0.34* 0.29* 0.29*    Recent Labs  Lab 10/15/17 0943  TROPIPOC 0.70*     BNP Recent Labs  Lab 10/15/17 0941 10/19/17 0509  BNP 635.3* 428.1*     DDimer No results for input(s): DDIMER in the last 168 hours.   Radiology    Dg Chest 1 View  Result Date: 10/18/2017 CLINICAL DATA:  Post thoracentesis EXAM: CHEST 1 VIEW COMPARISON:  Chest radiograph October 17, 2017 FINDINGS: Note that a portion of the left apex is not seen due to patient's mandible overlying this area. No pneumothorax appreciable. Right pleural effusion is smaller compared to 1 day prior. There is a new left pleural effusion, however. There is bibasilar consolidation, more on the left than on the right. There is cardiomegaly with pacemaker leads attached to the right atrium and right ventricle. There is an aortic valve replacement. Patient is status post median sternotomy. There is aortic atherosclerosis. No  adenopathy evident. Bones are osteoporotic. IMPRESSION: Right pleural effusion smaller. No pneumothorax. New left pleural effusion. There is bibasilar consolidation. Stable cardiomegaly. There is aortic atherosclerosis. Aortic Atherosclerosis (ICD10-I70.0). Electronically Signed   By: Lowella Grip III M.D.   On: 10/18/2017 13:11   Ir Thoracentesis Asp Pleural Space W/img Guide  Result Date: 10/18/2017 INDICATION: Patient with right pleural effusion. Request is made for therapeutic thoracentesis. EXAM: ULTRASOUND GUIDED THERAPEUTIC RIGHT THORACENTESIS MEDICATIONS: 10 mL 2% lidocaine COMPLICATIONS: None immediate. PROCEDURE: An ultrasound guided thoracentesis was thoroughly discussed with the patient and questions answered. The benefits, risks, alternatives and complications were also discussed. The patient understands and wishes to proceed with the procedure. Written consent was obtained. Ultrasound was performed to localize and mark an adequate pocket of fluid in the right chest. The area was then prepped and draped in the normal sterile fashion. 2% Lidocaine was used for local anesthesia. Under ultrasound guidance a Safe-T-Centesis catheter was introduced. Thoracentesis was performed. The catheter was removed and a dressing applied. FINDINGS: A total of approximately 2.0 liters of amber fluid was removed. IMPRESSION: Successful ultrasound guided right therapeutic thoracentesis yielding 2.0 liters of pleural fluid. Read by:  Brynda Greathouse PA-C Electronically Signed   By: Markus Daft M.D.   On: 10/18/2017 15:34    Cardiac Studies   Echo 10/16/17 Study Conclusions  - Left ventricle: The cavity size was normal. There was mild   concentric hypertrophy. Systolic function was normal. The   estimated ejection fraction was in the range of 50% to 55%. Mild   hypokinesis of the anteroseptal and inferoseptal myocardium. The   study is not technically sufficient to allow evaluation of LV   diastolic  function. - Aortic valve: A bioprosthesis was present. Transvalvular velocity   was within the normal range. There was no stenosis. There was   mild regurgitation. Peak velocity (S): 200 cm/s. - Mitral valve: A bioprosthesis was present. Pressure half-time:  130 ms. Mean gradient (D): 8 mm Hg. - Left atrium: The atrium was severely dilated. - Right ventricle: The cavity size was normal. Wall thickness was   normal. Systolic function was normal. - Tricuspid valve: There was moderate regurgitation. - Pulmonic valve: There was moderate regurgitation. - Pulmonary arteries: Systolic pressure was severely increased. PA   peak pressure: 68 mm Hg (S).  Impressions:  - Mean gradient across the bioprosthetic mitral valve has increased   from 4 mmHg to 8 mmHg since 06/2017.  Patient Profile     83 y.o. male with a history of valvular heart disease and CAD. He is s/p MVR with CABG x 1 at [facility] in 2009. He had an LAD DES in May 2015 followed by TAVR in June 2015. He has persistent atrial fibrillation and SSS. He is s/p MDT pacemaker and is pacer dependant. He is not anticoagulated secondary to a history of GI bleeding. He has diastolic CHF and pulmonary HTN and was admitted 10/15/17 with acute on chronic diastolic CHF.   Assessment & Plan    Acute on chronic diastolic CHF (congestive heart failure) (HCC) BNP elevated 635 -->428 on adm and CXR findings c/w atypical CHF. His history is c/w CHF.  EF 50-55% by echo 10/16/17 Successful US guided right therapeutic thoracentesis. Yielded 2.0 liters of red-colored fluid. He appears euvolemic. I will switch to lasix 40 mg po daily. Previously just 3x/week.   Atrial fibrillation ([place]) Not on anticoagulation secondary to history of GI bleeding and anemia  CAD S/P percutaneous coronary angioplasty - Xience DES 3.0 mm x 8 mm prox LAD May 2015. Troponin elevated-0.70. Will follow may be from acute CHF.  Mitral stenosis Porcine bioprosthetic tissue  valve placed 06/13/2008  Pacemaker - [employer]onic Adapta, dual chamber Pacemaker dependent , 100% pacing in the atrium and ventricle  S/P CABG x 1 SVG to LAD with open vein harvest via right thigh, LIMA harvested but not utilized by Dr Amador Cunas  S/P [place] operation for atrial fibrillation Partial left atrial lesion set using cryothermy for bilateral pulmonary vein isolation by Dr Amador Cunas  S/P TAVR (transcatheter aortic valve replacement) 29 mm Edwards Sapien XT transcatheter heart valve placed via open right transfemoral approach  Pulmonary HTN PA 68 mmHg  Plan: Plan is to work with PT&OT today, discharge to a SNF tomorrow.   Ena Dawley, MD 10/19/2017

## 2017-10-19 NOTE — Progress Notes (Signed)
CSW received [facility] authorization for SNF for patient. [facility] #[id number] good from today to 10/21/17. If patient does not discharge by this date, new [facility] will be needed. CSW to follow up with [facility] and confirm they still have bed available for patient. CSW to follow and support with discharge when medically ready.  Estanislado Emms, [place]

## 2017-10-19 NOTE — Care Management Important Message (Signed)
Important Message  Patient Details  Name: [name] [name] MRN: [id number] Date of Birth: 06-17-1929   Medicare Important Message Given:  Yes    [place] Christine Montine Circle 10/19/2017, 12:34 PM

## 2017-10-20 ENCOUNTER — Telehealth: Payer: Self-pay | Admitting: *Deleted

## 2017-10-20 ENCOUNTER — Emergency Department (HOSPITAL_COMMUNITY): Payer: Medicare Other

## 2017-10-20 ENCOUNTER — Inpatient Hospital Stay (HOSPITAL_COMMUNITY)
Admission: EM | Admit: 2017-10-20 | Discharge: 2017-10-25 | DRG: 480 | Disposition: A | Discharge status: Skilled Nursing Facility | Payer: Medicare Other | Attending: Internal Medicine | Admitting: Internal Medicine

## 2017-10-20 ENCOUNTER — Encounter (HOSPITAL_COMMUNITY): Payer: Self-pay | Admitting: Oncology

## 2017-10-20 DIAGNOSIS — E0590 Thyrotoxicosis, unspecified without thyrotoxic crisis or storm: Secondary | ICD-10-CM | POA: Diagnosis present

## 2017-10-20 DIAGNOSIS — S72002A Fracture of unspecified part of neck of left femur, initial encounter for closed fracture: Secondary | ICD-10-CM

## 2017-10-20 DIAGNOSIS — Z8673 Personal history of transient ischemic attack (TIA), and cerebral infarction without residual deficits: Secondary | ICD-10-CM

## 2017-10-20 DIAGNOSIS — Z6821 Body mass index (BMI) 21.0-21.9, adult: Secondary | ICD-10-CM

## 2017-10-20 DIAGNOSIS — I482 Chronic atrial fibrillation: Secondary | ICD-10-CM | POA: Diagnosis present

## 2017-10-20 DIAGNOSIS — M8580 Other specified disorders of bone density and structure, unspecified site: Secondary | ICD-10-CM | POA: Diagnosis present

## 2017-10-20 DIAGNOSIS — Z7902 Long term (current) use of antithrombotics/antiplatelets: Secondary | ICD-10-CM

## 2017-10-20 DIAGNOSIS — I255 Ischemic cardiomyopathy: Secondary | ICD-10-CM | POA: Diagnosis present

## 2017-10-20 DIAGNOSIS — Z66 Do not resuscitate: Secondary | ICD-10-CM | POA: Diagnosis present

## 2017-10-20 DIAGNOSIS — I2510 Atherosclerotic heart disease of native coronary artery without angina pectoris: Secondary | ICD-10-CM | POA: Diagnosis present

## 2017-10-20 DIAGNOSIS — G2581 Restless legs syndrome: Secondary | ICD-10-CM | POA: Diagnosis present

## 2017-10-20 DIAGNOSIS — I248 Other forms of acute ischemic heart disease: Secondary | ICD-10-CM | POA: Diagnosis present

## 2017-10-20 DIAGNOSIS — E43 Unspecified severe protein-calorie malnutrition: Secondary | ICD-10-CM | POA: Diagnosis present

## 2017-10-20 DIAGNOSIS — Z953 Presence of xenogenic heart valve: Secondary | ICD-10-CM

## 2017-10-20 DIAGNOSIS — I252 Old myocardial infarction: Secondary | ICD-10-CM

## 2017-10-20 DIAGNOSIS — I5032 Chronic diastolic (congestive) heart failure: Secondary | ICD-10-CM | POA: Diagnosis present

## 2017-10-20 DIAGNOSIS — W19XXXA Unspecified fall, initial encounter: Secondary | ICD-10-CM

## 2017-10-20 DIAGNOSIS — Z888 Allergy status to other drugs, medicaments and biological substances status: Secondary | ICD-10-CM

## 2017-10-20 DIAGNOSIS — Z950 Presence of cardiac pacemaker: Secondary | ICD-10-CM | POA: Diagnosis present

## 2017-10-20 DIAGNOSIS — Z87891 Personal history of nicotine dependence: Secondary | ICD-10-CM

## 2017-10-20 DIAGNOSIS — W010XXA Fall on same level from slipping, tripping and stumbling without subsequent striking against object, initial encounter: Secondary | ICD-10-CM | POA: Diagnosis present

## 2017-10-20 DIAGNOSIS — S72142A Displaced intertrochanteric fracture of left femur, initial encounter for closed fracture: Principal | ICD-10-CM | POA: Diagnosis present

## 2017-10-20 DIAGNOSIS — I080 Rheumatic disorders of both mitral and aortic valves: Secondary | ICD-10-CM | POA: Diagnosis present

## 2017-10-20 DIAGNOSIS — Z419 Encounter for procedure for purposes other than remedying health state, unspecified: Secondary | ICD-10-CM

## 2017-10-20 DIAGNOSIS — E785 Hyperlipidemia, unspecified: Secondary | ICD-10-CM | POA: Diagnosis present

## 2017-10-20 DIAGNOSIS — Z85828 Personal history of other malignant neoplasm of skin: Secondary | ICD-10-CM

## 2017-10-20 DIAGNOSIS — I110 Hypertensive heart disease with heart failure: Secondary | ICD-10-CM | POA: Diagnosis present

## 2017-10-20 DIAGNOSIS — Z923 Personal history of irradiation: Secondary | ICD-10-CM

## 2017-10-20 DIAGNOSIS — Z8546 Personal history of malignant neoplasm of prostate: Secondary | ICD-10-CM

## 2017-10-20 DIAGNOSIS — I739 Peripheral vascular disease, unspecified: Secondary | ICD-10-CM | POA: Diagnosis present

## 2017-10-20 DIAGNOSIS — K219 Gastro-esophageal reflux disease without esophagitis: Secondary | ICD-10-CM | POA: Diagnosis present

## 2017-10-20 DIAGNOSIS — Z951 Presence of aortocoronary bypass graft: Secondary | ICD-10-CM

## 2017-10-20 DIAGNOSIS — Z79899 Other long term (current) drug therapy: Secondary | ICD-10-CM

## 2017-10-20 DIAGNOSIS — I272 Pulmonary hypertension, unspecified: Secondary | ICD-10-CM | POA: Diagnosis present

## 2017-10-20 DIAGNOSIS — Z955 Presence of coronary angioplasty implant and graft: Secondary | ICD-10-CM

## 2017-10-20 LAB — CBC WITH DIFFERENTIAL/PLATELET
Basophils Absolute: 0 10*3/uL (ref 0.0–0.1)
Basophils Relative: 0 %
EOS ABS: 0 10*3/uL (ref 0.0–0.7)
EOS PCT: 1 %
HCT: 31.3 % — ABNORMAL LOW (ref 39.0–52.0)
HEMOGLOBIN: 9.5 g/dL — AB (ref 13.0–17.0)
LYMPHS ABS: 0.9 10*3/uL (ref 0.7–4.0)
Lymphocytes Relative: 13 %
MCH: 27.3 pg (ref 26.0–34.0)
MCHC: 30.4 g/dL (ref 30.0–36.0)
MCV: 89.9 fL (ref 78.0–100.0)
MONOS PCT: 6 %
Monocytes Absolute: 0.4 10*3/uL (ref 0.1–1.0)
NEUTROS PCT: 80 %
Neutro Abs: 5.5 10*3/uL (ref 1.7–7.7)
Platelets: 129 10*3/uL — ABNORMAL LOW (ref 150–400)
RBC: 3.48 MIL/uL — ABNORMAL LOW (ref 4.22–5.81)
RDW: 16.2 % — ABNORMAL HIGH (ref 11.5–15.5)
WBC: 6.9 10*3/uL (ref 4.0–10.5)

## 2017-10-20 LAB — SAMPLE TO BLOOD BANK

## 2017-10-20 NOTE — Telephone Encounter (Signed)
Pt was on TCM list admitted 10/15/17 for Acute on chronic diastolic heart failure. Initial workup in the ED revealed BNP 635, troponin of 0.7, normal Cr, Hgb 10.6 (baseline earlier this year 10-12). EKG showed atrial fib similar to prior. CXR showed edema versus consolidation, right pleural effusion, and other stable signs of CHF. Pt was D/C 10/18/17, and sent to SNF ([facility]) per summary pt will f/u w/ cardiology 10/26/17...Johny Chess         r

## 2017-10-20 NOTE — ED Triage Notes (Signed)
Pt bib [facility] from [facility] d/t fall.  Pt had x-rays done showing a left proximal femoral fx.  Pt sent here for further evaluation.

## 2017-10-20 NOTE — ED Notes (Signed)
Nurse collected labs. 

## 2017-10-20 NOTE — Telephone Encounter (Signed)
Discharge summary written yesterday. I called at home, spoke w wife - she states pt still in hospital. Will need call reattempt next business day (10/23/18).

## 2017-10-20 NOTE — ED Notes (Signed)
Patient transported to X-ray 

## 2017-10-20 NOTE — ED Provider Notes (Signed)
[facility] EMERGENCY DEPARTMENT Provider Note   CSN: [id number] Arrival date & time: 10/20/17  2301     History   Chief Complaint Chief Complaint  Patient presents with  . Hip Pain    HPI   Blood pressure (!) 112/46, pulse 84, temperature 97.9 F (36.6 C), temperature source Oral, resp. rate 18, SpO2 97 %.  [name] [name] is a 83 y.o. male brought in by EMS for [facility] secondary to mechanical fall after being tripped up in cords last night.  There is no head trauma, no other area of pain besides the left hip.  He states that his pain is minimal but exacerbated when weightbearing, he is ambulatory but with severe pain when he stands.  Denies anticoagulation but takes Plavix.   Past Medical History:  Diagnosis Date  . ANEMIA, IRON DEFICIENCY 10/13/2008  . ANXIETY 09/04/2008  . Aortic valvular stenosis    a. s/p TAVR 04/2014.  Marland Kitchen BARRETTS ESOPHAGUS 03/20/2009  . BASAL CELL CARCINOMA OF SKIN SITE UNSPECIFIED 09/28/2010  . Cardiomyopathy, ischemic 11/19/2013   EF 45-50% with inferior wall hypokinesis by echo 2014  . CATARACTS, BILATERAL, HX OF 01/14/2008  . CEREBROVASCULAR ACCIDENT, HX OF 08/18/2008  . Chronic diastolic CHF (congestive heart failure) ([place])   . COLONIC POLYPS, ADENOMATOUS 01/14/2008  . CORONARY ARTERY DISEASE    a. s/p single vessel CABG 2009 and bioprosthetic MVR. b. DES to proximal LAD 03/2014.  . Edema, peripheral 07/30/2009   LEV doppler- no thrombus or thrombophlebitis  . Elevated PSA   . Falls   . GASTRIC POLYP 05/13/2009  . GERD 06/01/2007  . History of hiatal hernia   . Hyperglycemia   . HYPERLIPIDEMIA 06/01/2007  . HYPERTENSION 06/01/2007  . 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  . Mitral valve disease 06/13/2008   a. mitral valve replacement #20 St. Jude Biocor; Maze procedure by Dr. Amador Cunas  at [facility].  Marland Kitchen Pacemaker   . Peripheral artery disease ([place]) 06/16/2010   LEA doppler-left ABI nml 0.61 calcified  vessels;right ABI  0.68  . Permanent atrial fibrillation (H[facility])    a. previously on warfarin, anticoag d/c'd due to GIB, now felt to be permanent.  . Prostate cancer [facility])    s/p radiation  . Pulmonary hypertension ([place])   . Restless leg syndrome   . S/P CABG x 1 06/13/2008   SVG to LAD with open vein harvest right thigh, LIMA harvested but not utilized by Dr Amador Cunas  . S/P Maze operation for atrial fibrillation 06/13/2008   Partial left atrial lesion set using cryothermy for bilateral pulmonary vein isolation by Dr Amador Cunas  . S/P mitral valve replacement with bioprosthetic valve 06/13/2008   26mm St Jude Biocor porcine bioprosthetic tissue valve by Dr Amador Cunas  . 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 Tachy-brady syndrome ([place]) 07/13/2008   a. medtronic Adapta gen. model ADDR01, serial # [id number] H by Dr  [name] at [facility].  Marland Kitchen UNSPECIFIED VENOUS INSUFFICIENCY 09/18/2008    Patient Active Problem List   Diagnosis Date Noted  . Severe malnutrition ([place]) 10/19/2017  . Hypoxia 10/19/2017  . Pulmonary hypertension ([place]) 10/19/2017  . NSTEMI (non-ST elevated myocardial infarction) ([place])   . Acute on chronic diastolic (congestive) heart failure ([place]) 10/15/2017  . Osteopenia 08/22/2017  . Acute on chronic diastolic heart failure ([place]) 06/25/2017  . Overflow  incontinence 06/25/2017  . Vocal fold paralysis, left 06/07/2017  . Hypocalcemia 12/29/2016  . CAP (community acquired pneumonia) 12/17/2016  . Elevated troponin 12/17/2016  . CHF (congestive heart failure) ([place]) 12/17/2016  . Pressure ulcer of sacral region, stage 1 09/23/2016  . Permanent atrial fibrillation ([place]) 09/01/2016  . Chronic diastolic heart failure ([place]) 03/28/2016  . Diaphragmatic hernia 03/28/2016  . 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 Risk for falls 03/28/2016  . Paresthesia of  left lower extremity 11/11/2015  . Hoarse 12/23/2014  . History of GI bleed 05/17/2014  . Rectal bleed 05/17/2014  . Hypotension 05/17/2014  . 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 Dizziness 04/05/2014  . SSS (sick sinus syndrome) ([place]) 03/25/2014  . CAD S/P percutaneous coronary angioplasty -  03/24/2014  . Perivalvular leak of prosthetic heart valve   . Mitral stenosis   . Routine general medical examination at a health care facility 12/31/2013  . Solitary pulmonary nodule 12/08/2013  . Pleural effusion 12/05/2013  . Dyspnea 12/05/2013  . Pacemaker - Medtronic Adapta, dual chamber 11/19/2013  . Restrictive lung disease 03/30/2013  . Encounter for long-term (current) use of other medications 08/17/2012  . BASAL CELL CARCINOMA OF SKIN SITE UNSPECIFIED 09/28/2010  . PERSONAL HX COLONIC POLYPS 01/01/2010  . BARRETTS ESOPHAGUS 03/20/2009  . Hematuria 10/21/2008  . Iron deficiency anemia 10/13/2008  . Anemia 09/18/2008  . UNSPECIFIED VENOUS INSUFFICIENCY 09/18/2008  . ANXIETY 09/04/2008  . Laceration of leg 09/04/2008  . CEREBROVASCULAR ACCIDENT, HX OF 08/18/2008  . S/P CABG x 1 06/13/2008  . S/P mitral valve replacement with bioprosthetic valve 06/13/2008  . S/P Maze operation for atrial fibrillation 06/13/2008  . PROSTATE CANCER, HX OF 01/14/2008  . CATARACTS, BILATERAL, HX OF 01/14/2008  . Dyslipidemia 06/01/2007  . Essential hypertension 06/01/2007  . GERD 06/01/2007  . Unspecified essential hypertension 06/01/2007  . CAD (coronary artery disease) 06/01/2007    Past Surgical History:  Procedure Laterality Date  . CARDIAC CATHETERIZATION  11/01/1990  . CARDIOVASCULAR STRESS TEST  06/03/2010   Persantine perfusion EF 45% mild to moderate ischemia basal and mid inferolateral region  . CARDIOVASCULAR STRESS TEST  01/01/2008   left ventricle normal,no significant ischemia  . CARDIOVASCULAR STRESS TEST  09/09/2005    possibility of ischemia inferior  wall  . COLONOSCOPY  07/14/2009   diverticulosis, internal hemorrhoids  . CORONARY ARTERY BYPASS GRAFT  06/13/2008   graft x1 w/vein graft LAD artery by Dr. Amador [facility]unas at [facility]  . CORONARY STENT PLACEMENT  03/24/2014   DES to LAD  . CYSTECTOMY     Left leg  . INTRAOPERATIVE TRANSESOPHAGEAL ECHOCARDIOGRAM N/A 04/22/2014   Procedure: INTRAOPERATIVE TRANSTHORACIC ECHOCARDIOGRAM;  Surgeon: [name], MD;  Location: [facility];  Service: Open Heart Surgery;  Laterality: N/A;  . IR THORACENTESIS ASP PLEURAL SPACE W/IMG GUIDE  10/18/2017  . LARYNGOPLASTY Left 06/07/2017   Procedure: LARYNGOPLASTY;  Surgeon: Melida Quitter, MD;  Location: [facility];  Service: ENT;  Laterality: Left;  LEFT MEDIALIZATION LARYNGOPLASTY  LOCAL WITH IV SEDATION  . LEFT AND RIGHT HEART CATHETERIZATION WITH CORONARY ANGIOGRAM N/A 01/08/2014   Procedure: LEFT AND RIGHT HEART CATHETERIZATION WITH CORONARY ANGIOGRAM;  Surgeon: Sanda Klein, MD;  Location: [facility] CATH LAB;  Service: Cardiovascular;  Laterality: N/A;  . MITRAL VALVE REPLACEMENT  06/13/2008   #20 St. Jude Bicor mitral valve ;Maze procedure at [facility] by Dr. Amador Cunas  . PACEMAKER PLACEMENT  07/13/2008   [place] dual chamber-Medtronic at [facility]   . PERCUTANEOUS  CORONARY ROTOBLATOR INTERVENTION (PCI-R) N/A 03/24/2014   Procedure: PERCUTANEOUS CORONARY ROTOBLATOR INTERVENTION (PCI-R);  Surgeon: Blane Ohara, MD;  Location: [facility] CATH LAB;  Service: Cardiovascular;  Laterality: N/A;  . SKIN CANCER EXCISION     removed from forehead and right leg, nose/face  . TEE WITHOUT CARDIOVERSION N/A 01/22/2014   Procedure: TRANSESOPHAGEAL ECHOCARDIOGRAM (TEE);  Surgeon: Sanda Klein, MD;  Location: [facility] ENDOSCOPY;  Service: Cardiovascular;  Laterality: N/A;  . TONSILLECTOMY    . TRANSCATHETER AORTIC VALVE REPLACEMENT, TRANSFEMORAL N/A 04/22/2014   Procedure: TRANSCATHETER AORTIC VALVE REPLACEMENT, TRANSFEMORAL;  Surgeon: [name], MD;  Location: [facility];  Service: Open Heart Surgery;  Laterality:  N/A;  TAVR-TF (RIGHT SIDE)  . UPPER GASTROINTESTINAL ENDOSCOPY  07/14/2009   erosive reflux esopagitis, Barrett's esophagus       Home Medications    Prior to Admission medications   Medication Sig Start Date End Date Taking? Authorizing Provider  clopidogrel (PLAVIX) 75 MG tablet Take 1 tablet (75 mg total) by mouth daily with breakfast. 07/29/16  Yes [name], [name], MD  feeding supplement, ENSURE ENLIVE, (ENSURE ENLIVE) LIQD Take 237 mLs by mouth 3 (three) times daily between meals. 10/19/17  Yes Dunn, Dayna N, PA-C  furosemide (LASIX) 40 MG tablet Take 1 tablet (40 mg total) by mouth daily. 10/19/17  Yes Dunn, Dayna N, PA-C  metoprolol succinate (TOPROL XL) 25 MG 24 hr tablet Take 1 tablet (25 mg total) by mouth daily. 08/28/17  Yes Barrett, Evelene Croon, PA-C  Multiple Vitamin (MULTIVITAMIN WITH MINERALS) TABS Take 1 tablet by mouth daily. Centrum Silver   Yes [provider]  pantoprazole (PROTONIX) 40 MG tablet Take 1 tablet (40 mg total) by mouth daily. 11/09/16  Yes [name], MD  potassium chloride SA (K-DUR,KLOR-CON) 20 MEQ tablet Take 20 mEq by mouth daily.   Yes [provider]  simvastatin (ZOCOR) 40 MG tablet Take 1 tablet (40 mg total) by mouth at bedtime. 07/19/17  Yes [name], MD  Tamsulosin HCl (FLOMAX) 0.4 MG CAPS Take 0.4 mg by mouth at bedtime.    Yes [provider]  triamcinolone (KENALOG) 0.025 % cream Apply 1 application topically 3 (three) times daily as needed. For rash 08/22/17  Yes [name], MD    Family History Family History  Problem Relation Age of Onset  . Breast cancer Mother   . Stroke Mother   . Transient ischemic attack Mother     Social History Social History   Tobacco Use  . Smoking status: Former Smoker    Years: 2.00    Types: Pipe    Last attempt to quit: 11/07/1965    Years since quitting: 51.9  . Smokeless tobacco: Never Used  . Tobacco comment: quit over 40 years ago, never smoked cigarettes    Substance Use Topics  . Alcohol use: No  . Drug use: No     Allergies   Amiodarone   Review of Systems Review of Systems  A complete review of systems was obtained and all systems are negative except as noted in the HPI and PMH.    Physical Exam Updated Vital Signs BP 109/65   Pulse 79   Temp 97.9 F (36.6 C) (Oral)   Resp (!) 24   SpO2 91%   Physical Exam  Constitutional: He is oriented to person, place, and time. He appears well-developed and well-nourished. No distress.  HENT:  Head: Normocephalic and atraumatic.  Mouth/Throat: Oropharynx is clear and moist.  Eyes: Conjunctivae and EOM  are normal. Pupils are equal, round, and reactive to light.  Neck: Normal range of motion.  Cardiovascular: Normal rate and intact distal pulses.  Irregularly irregular  Pulmonary/Chest: Effort normal and breath sounds normal. No stridor.  Nasal cannula at 2 L a minute  Abdominal: Soft. There is no tenderness.  Musculoskeletal:  No significant shortening, mild internal rotation on the left, neurovascularly intact.  Neurological: He is alert and oriented to person, place, and time.  Skin: He is not diaphoretic.  Psychiatric: He has a normal mood and affect.  Nursing note and vitals reviewed.    ED Treatments / Results  Labs (all labs ordered are listed, but only abnormal results are displayed) Labs Reviewed  CBC WITH DIFFERENTIAL/PLATELET - Abnormal; Notable for the following components:      Result Value   RBC 3.48 (*)    Hemoglobin 9.5 (*)    HCT 31.3 (*)    RDW 16.2 (*)    Platelets 129 (*)    All other components within normal limits  BASIC METABOLIC PANEL - Abnormal; Notable for the following components:   Chloride 98 (*)    CO2 37 (*)    Glucose, Bld 110 (*)    BUN 28 (*)    Calcium 8.1 (*)    All other components within normal limits  SAMPLE TO BLOOD BANK    EKG  EKG Interpretation None       Radiology Dg Chest 2 View  Result Date:  10/21/2017 CLINICAL DATA:  Status post fall, with concern for chest injury. Initial encounter. EXAM: CHEST  2 VIEW COMPARISON:  Chest radiograph performed 10/18/2017 FINDINGS: A moderate right-sided pleural effusion is noted. Vascular congestion is seen, with bilateral central airspace opacities. This may reflect pulmonary edema or possibly pneumonia. No pneumothorax is seen. The cardiomediastinal silhouette is mildly enlarged. A pacemaker is noted at the left chest wall, with leads ending at the right atrium and right ventricle. IMPRESSION: 1. Moderate right-sided pleural effusion. Vascular congestion and mild cardiomegaly, with bilateral central airspace opacities. This may reflect pulmonary edema or possibly pneumonia. 2. No displaced rib fracture seen. Electronically Signed   By: Garald Balding M.D.   On: 10/21/2017 01:33   Dg Hip Unilat With Pelvis 2-3 Views Left  Result Date: 10/21/2017 CLINICAL DATA:  Status post fall, with left hip pain. Initial encounter. EXAM: DG HIP (WITH OR WITHOUT PELVIS) 2-3V LEFT COMPARISON:  None. FINDINGS: There is a mildly displaced left femoral intertrochanteric fracture. The femoral heads remain seated within their respective acetabula. No additional fractures are seen. Diffuse calcification is seen along the abdominal aorta and its branches. The visualized bowel gas pattern is grossly unremarkable. The sacroiliac joints are not well characterized, but appear grossly unremarkable. The sacrum is difficult to assess. IMPRESSION: 1. Mildly displaced left femoral intertrochanteric fracture. 2. Diffuse aortic atherosclerosis. Electronically Signed   By: Garald Balding M.D.   On: 10/21/2017 01:38    Procedures Procedures (including critical care time)  Medications Ordered in ED Medications  tamsulosin (FLOMAX) capsule 0.4 mg (not administered)  simvastatin (ZOCOR) tablet 40 mg (not administered)  metoprolol succinate (TOPROL-XL) 24 hr tablet 25 mg (not administered)   multivitamin with minerals tablet 1 tablet (not administered)  pantoprazole (PROTONIX) EC tablet 40 mg (not administered)  furosemide (LASIX) tablet 40 mg (not administered)  feeding supplement (ENSURE ENLIVE) (ENSURE ENLIVE) liquid 237 mL (not administered)  potassium chloride SA (K-DUR,KLOR-CON) CR tablet 20 mEq (not administered)     Initial Impression /  Assessment and Plan / ED Course  I have reviewed the triage vital signs and the nursing notes.  Pertinent labs & imaging results that were available during my care of the patient were reviewed by me and considered in my medical decision making (see chart for details).       Vitals:   10/20/17 2315 10/21/17 0100 10/21/17 0130 10/21/17 0145  BP: 112/70 119/83 113/70 109/65  Pulse: 83 89 90 79  Resp: 16 20 18  (!) 24  Temp:      TempSrc:      SpO2: 94% 92% 93% 91%    Medications  tamsulosin (FLOMAX) capsule 0.4 mg (not administered)  simvastatin (ZOCOR) tablet 40 mg (not administered)  metoprolol succinate (TOPROL-XL) 24 hr tablet 25 mg (not administered)  multivitamin with minerals tablet 1 tablet (not administered)  pantoprazole (PROTONIX) EC tablet 40 mg (not administered)  furosemide (LASIX) tablet 40 mg (not administered)  feeding supplement (ENSURE ENLIVE) (ENSURE ENLIVE) liquid 237 mL (not administered)  potassium chloride SA (K-DUR,KLOR-CON) CR tablet 20 mEq (not administered)    RENZO VINCELETTE is 83 y.o. male presenting with mechanical fall and left hip pain, he is ambulatory. This is a closed injury. He is neurovascularly intact. X-ray with intertrochanteric fracture. Discussed with orthopedic PA Ayo who has discussed with her attending, OR is full tomorrow, they will consult in the a.m. Discussed with Dr. Alcario Drought who will admit the patient, patient declines pain medication in the ED.   Final Clinical Impressions(s) / ED Diagnoses   Final diagnoses:  Closed left hip fracture, initial encounter [facility])    ED  Discharge Orders    None       Waynetta Pean 10/21/17 0230    Veryl Speak, MD 10/21/17 [phone]

## 2017-10-21 ENCOUNTER — Other Ambulatory Visit: Payer: Self-pay

## 2017-10-21 DIAGNOSIS — W19XXXA Unspecified fall, initial encounter: Secondary | ICD-10-CM | POA: Diagnosis not present

## 2017-10-21 DIAGNOSIS — S72002A Fracture of unspecified part of neck of left femur, initial encounter for closed fracture: Secondary | ICD-10-CM | POA: Diagnosis not present

## 2017-10-21 DIAGNOSIS — I080 Rheumatic disorders of both mitral and aortic valves: Secondary | ICD-10-CM | POA: Diagnosis present

## 2017-10-21 DIAGNOSIS — Z950 Presence of cardiac pacemaker: Secondary | ICD-10-CM | POA: Diagnosis not present

## 2017-10-21 DIAGNOSIS — S72142A Displaced intertrochanteric fracture of left femur, initial encounter for closed fracture: Secondary | ICD-10-CM | POA: Diagnosis present

## 2017-10-21 DIAGNOSIS — Z953 Presence of xenogenic heart valve: Secondary | ICD-10-CM | POA: Diagnosis not present

## 2017-10-21 DIAGNOSIS — E785 Hyperlipidemia, unspecified: Secondary | ICD-10-CM | POA: Diagnosis present

## 2017-10-21 DIAGNOSIS — G2581 Restless legs syndrome: Secondary | ICD-10-CM | POA: Diagnosis present

## 2017-10-21 DIAGNOSIS — Z87891 Personal history of nicotine dependence: Secondary | ICD-10-CM | POA: Diagnosis not present

## 2017-10-21 DIAGNOSIS — Z79899 Other long term (current) drug therapy: Secondary | ICD-10-CM | POA: Diagnosis not present

## 2017-10-21 DIAGNOSIS — I248 Other forms of acute ischemic heart disease: Secondary | ICD-10-CM | POA: Diagnosis present

## 2017-10-21 DIAGNOSIS — I482 Chronic atrial fibrillation: Secondary | ICD-10-CM | POA: Diagnosis present

## 2017-10-21 DIAGNOSIS — I5032 Chronic diastolic (congestive) heart failure: Secondary | ICD-10-CM | POA: Diagnosis present

## 2017-10-21 DIAGNOSIS — I2510 Atherosclerotic heart disease of native coronary artery without angina pectoris: Secondary | ICD-10-CM

## 2017-10-21 DIAGNOSIS — E0590 Thyrotoxicosis, unspecified without thyrotoxic crisis or storm: Secondary | ICD-10-CM | POA: Diagnosis present

## 2017-10-21 DIAGNOSIS — I255 Ischemic cardiomyopathy: Secondary | ICD-10-CM | POA: Diagnosis present

## 2017-10-21 DIAGNOSIS — Z951 Presence of aortocoronary bypass graft: Secondary | ICD-10-CM | POA: Diagnosis not present

## 2017-10-21 DIAGNOSIS — Z923 Personal history of irradiation: Secondary | ICD-10-CM | POA: Diagnosis not present

## 2017-10-21 DIAGNOSIS — Z8546 Personal history of malignant neoplasm of prostate: Secondary | ICD-10-CM | POA: Diagnosis not present

## 2017-10-21 DIAGNOSIS — I272 Pulmonary hypertension, unspecified: Secondary | ICD-10-CM | POA: Diagnosis present

## 2017-10-21 DIAGNOSIS — K219 Gastro-esophageal reflux disease without esophagitis: Secondary | ICD-10-CM | POA: Diagnosis present

## 2017-10-21 DIAGNOSIS — Z6821 Body mass index (BMI) 21.0-21.9, adult: Secondary | ICD-10-CM | POA: Diagnosis not present

## 2017-10-21 DIAGNOSIS — Z66 Do not resuscitate: Secondary | ICD-10-CM | POA: Diagnosis present

## 2017-10-21 DIAGNOSIS — I739 Peripheral vascular disease, unspecified: Secondary | ICD-10-CM | POA: Diagnosis present

## 2017-10-21 DIAGNOSIS — I110 Hypertensive heart disease with heart failure: Secondary | ICD-10-CM | POA: Diagnosis present

## 2017-10-21 DIAGNOSIS — I252 Old myocardial infarction: Secondary | ICD-10-CM | POA: Diagnosis not present

## 2017-10-21 DIAGNOSIS — W010XXA Fall on same level from slipping, tripping and stumbling without subsequent striking against object, initial encounter: Secondary | ICD-10-CM | POA: Diagnosis present

## 2017-10-21 DIAGNOSIS — E43 Unspecified severe protein-calorie malnutrition: Secondary | ICD-10-CM | POA: Diagnosis present

## 2017-10-21 LAB — BASIC METABOLIC PANEL
Anion gap: 6 (ref 5–15)
BUN: 28 mg/dL — ABNORMAL HIGH (ref 6–20)
CALCIUM: 8.1 mg/dL — AB (ref 8.9–10.3)
CO2: 37 mmol/L — AB (ref 22–32)
CREATININE: 0.77 mg/dL (ref 0.61–1.24)
Chloride: 98 mmol/L — ABNORMAL LOW (ref 101–111)
GFR calc non Af Amer: >60 mL/min (ref >60)
GLUCOSE: 110 mg/dL — AB (ref 65–99)
Potassium: 3.7 mmol/L (ref 3.5–5.1)
Sodium: 141 mmol/L (ref 135–145)

## 2017-10-21 MED ORDER — HYDROCODONE-ACETAMINOPHEN 5-325 MG PO TABS
1.0000 | ORAL_TABLET | Freq: Four times a day (QID) | ORAL | Status: DC | PRN
  Administered 2017-10-23: 2 via ORAL
  Filled 2017-10-21: qty 2

## 2017-10-21 MED ORDER — SIMVASTATIN 40 MG PO TABS
40.0000 mg | ORAL_TABLET | Freq: Every day | ORAL | Status: DC
  Administered 2017-10-21 – 2017-10-24 (×4): 40 mg via ORAL
  Filled 2017-10-21 (×4): qty 1

## 2017-10-21 MED ORDER — POTASSIUM CHLORIDE CRYS ER 20 MEQ PO TBCR
20.0000 meq | EXTENDED_RELEASE_TABLET | Freq: Every day | ORAL | Status: DC
  Administered 2017-10-21 – 2017-10-23 (×3): 20 meq via ORAL
  Filled 2017-10-21 (×3): qty 1

## 2017-10-21 MED ORDER — ADULT MULTIVITAMIN W/MINERALS CH
1.0000 | ORAL_TABLET | Freq: Every day | ORAL | Status: DC
  Administered 2017-10-21 – 2017-10-25 (×4): 1 via ORAL
  Filled 2017-10-21 (×4): qty 1

## 2017-10-21 MED ORDER — METOPROLOL SUCCINATE ER 25 MG PO TB24
25.0000 mg | ORAL_TABLET | Freq: Every day | ORAL | Status: DC
  Administered 2017-10-21 – 2017-10-25 (×5): 25 mg via ORAL
  Filled 2017-10-21 (×5): qty 1

## 2017-10-21 MED ORDER — MORPHINE SULFATE (PF) 4 MG/ML IV SOLN: 0.5000 mg | INTRAVENOUS | Status: DC | PRN

## 2017-10-21 MED ORDER — FUROSEMIDE 40 MG PO TABS
40.0000 mg | ORAL_TABLET | Freq: Every day | ORAL | Status: DC
  Administered 2017-10-21 – 2017-10-25 (×5): 40 mg via ORAL
  Filled 2017-10-21 (×5): qty 1

## 2017-10-21 MED ORDER — ENSURE ENLIVE PO LIQD
237.0000 mL | Freq: Three times a day (TID) | ORAL | Status: DC
  Administered 2017-10-21 – 2017-10-24 (×7): 237 mL via ORAL

## 2017-10-21 MED ORDER — TAMSULOSIN HCL 0.4 MG PO CAPS
0.4000 mg | ORAL_CAPSULE | Freq: Every day | ORAL | Status: DC
  Administered 2017-10-21 – 2017-10-24 (×4): 0.4 mg via ORAL
  Filled 2017-10-21 (×4): qty 1

## 2017-10-21 MED ORDER — PANTOPRAZOLE SODIUM 40 MG PO TBEC
40.0000 mg | DELAYED_RELEASE_TABLET | Freq: Every day | ORAL | Status: DC
  Administered 2017-10-21 – 2017-10-25 (×4): 40 mg via ORAL
  Filled 2017-10-21 (×4): qty 1

## 2017-10-21 MED ORDER — HEPARIN SODIUM (PORCINE) 5000 UNIT/ML IJ SOLN
5000.0000 [IU] | Freq: Three times a day (TID) | INTRAMUSCULAR | Status: DC
  Administered 2017-10-21 – 2017-10-24 (×10): 5000 [IU] via SUBCUTANEOUS
  Filled 2017-10-21 (×10): qty 1

## 2017-10-21 NOTE — Care Management Note (Signed)
Case Management Note  Patient Details  Name: MONICO SUDDUTH MRN: [id number] Date of Birth: 1929/08/22  Subjective/Objective:                 Spoke w patient at bedside. He is from [employer], lives in home w his wife. Admitted w hip fracture. Will continue to follow, planning on sx in next 1-2 days. Current CSW consult for possible SNF placement.    Action/Plan:   Expected Discharge Date:                  Expected Discharge Plan:  [place]  In-House Referral:     Discharge planning Services  CM Consult  Post Acute Care Choice:    Choice offered to:     DME Arranged:    DME Agency:     HH Arranged:    [place] Agency:     Status of Service:  In process, will continue to follow  If discussed at Long Length of Stay Meetings, dates discussed:    Additional Comments:  Carles Collet, RN 10/21/2017, 2:45 PM

## 2017-10-21 NOTE — Consult Note (Signed)
Patient ID: [name] [name] MRN: [id number] DOB/AGE: 1929/04/28 83 y.o.  Admit date: 10/20/2017  Admission Diagnoses:  Principal Problem:   Closed left hip fracture, initial encounter [facility]) Active Problems:   Pacemaker - Medtronic Adapta, dual chamber   Chronic diastolic heart failure (HCC)   CAD (coronary artery disease)   HPI: Pleasant 83 year old male pt presents to the hospital after a trama in his room at the SNF.  He reports severe left hip pain which increases with weight bearing.  The pt also has a chronic hx of CHF, CAD, AVS. Hx of CVA.  He was recently hospitalized because of a CHF exasperation.   Past Medical History: Past Medical History:  Diagnosis Date  . ANEMIA, IRON DEFICIENCY 10/13/2008  . ANXIETY 09/04/2008  . Aortic valvular stenosis    a. s/p TAVR 04/2014.  Marland Kitchen BARRETTS ESOPHAGUS 03/20/2009  . BASAL CELL CARCINOMA OF SKIN SITE UNSPECIFIED 09/28/2010  . Cardiomyopathy, ischemic 11/19/2013   EF 45-50% with inferior wall hypokinesis by echo 2014  . CATARACTS, BILATERAL, HX OF 01/14/2008  . CEREBROVASCULAR ACCIDENT, HX OF 08/18/2008  . Chronic diastolic CHF (congestive heart failure) ([place])   . COLONIC POLYPS, ADENOMATOUS 01/14/2008  . CORONARY ARTERY DISEASE    a. s/p single vessel CABG 2009 and bioprosthetic MVR. b. DES to proximal LAD 03/2014.  . Edema, peripheral 07/30/2009   LEV doppler- no thrombus or thrombophlebitis  . Elevated PSA   . Falls   . GASTRIC POLYP 05/13/2009  . GERD 06/01/2007  . History of hiatal hernia   . Hyperglycemia   . HYPERLIPIDEMIA 06/01/2007  . HYPERTENSION 06/01/2007  . 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  . Mitral valve disease 06/13/2008   a. mitral valve replacement #20 St. Jude Biocor; Maze procedure by Dr. Amador Cunas  at [facility].  Marland Kitchen Pacemaker   . Peripheral artery disease ([place]) 06/16/2010   LEA doppler-left ABI nml 0.61 calcified vessels;right ABI  0.68  . Permanent atrial fibrillation (HCC)    a. previously on  warfarin, anticoag d/c'd due to GIB, now felt to be permanent.  . Prostate cancer [facility])    s/p radiation  . Pulmonary hypertension ([place])   . Restless leg syndrome   . S/P CABG x 1 06/13/2008   SVG to LAD with open vein harvest right thigh, LIMA harvested but not utilized by Dr Amador Cunas  . S/P Maze operation for atrial fibrillation 06/13/2008   Partial left atrial lesion set using cryothermy for bilateral pulmonary vein isolation by Dr Amador Cunas  . S/P mitral valve replacement with bioprosthetic valve 06/13/2008   30mm St Jude Biocor porcine bioprosthetic tissue valve by Dr Amador Cunas  . 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 Tachy-brady syndrome ([place]) 07/13/2008   a. medtronic Adapta gen. model ADDR01, serial # [id number] by Dr  [name] at [facility].  Marland Kitchen UNSPECIFIED VENOUS INSUFFICIENCY 09/18/2008    Surgical History: Past Surgical History:  Procedure Laterality Date  . CARDIAC CATHETERIZATION  11/01/1990  . CARDIOVASCULAR STRESS TEST  06/03/2010   Persantine perfusion EF 45% mild to moderate ischemia basal and mid inferolateral region  . CARDIOVASCULAR STRESS TEST  01/01/2008   left ventricle normal,no significant ischemia  . CARDIOVASCULAR STRESS TEST  09/09/2005    possibility of ischemia inferior wall  . COLONOSCOPY  07/14/2009   diverticulosis, internal hemorrhoids  . CORONARY ARTERY  BYPASS GRAFT  06/13/2008   graft x1 w/vein graft LAD artery by Dr. Amador Cunas at [facility]  . CORONARY STENT PLACEMENT  03/24/2014   DES to LAD  . CYSTECTOMY     Left leg  . INTRAOPERATIVE TRANSESOPHAGEAL ECHOCARDIOGRAM N/A 04/22/2014   Procedure: INTRAOPERATIVE TRANSTHORACIC ECHOCARDIOGRAM;  Surgeon: [name], MD;  Location: [place];  Service: Open Heart Surgery;  Laterality: N/A;  . IR THORACENTESIS ASP PLEURAL SPACE W/IMG GUIDE  10/18/2017  . LARYNGOPLASTY Left 06/07/2017   Procedure:  LARYNGOPLASTY;  Surgeon: Melida Quitter, MD;  Location: [place];  Service: ENT;  Laterality: Left;  LEFT MEDIALIZATION LARYNGOPLASTY  LOCAL WITH IV SEDATION  . LEFT AND RIGHT HEART CATHETERIZATION WITH CORONARY ANGIOGRAM N/A 01/08/2014   Procedure: LEFT AND RIGHT HEART CATHETERIZATION WITH CORONARY ANGIOGRAM;  Surgeon: Sanda Klein, MD;  Location: [place] CATH LAB;  Service: Cardiovascular;  Laterality: N/A;  . MITRAL VALVE REPLACEMENT  06/13/2008   #20 St. Jude Bicor mitral valve ;Maze procedure at [facility] by Dr. Amador Cunas  . PACEMAKER PLACEMENT  07/13/2008   [place] dual chamber-Medtronic at [facility]   . PERCUTANEOUS CORONARY ROTOBLATOR INTERVENTION (PCI-R) N/A 03/24/2014   Procedure: PERCUTANEOUS CORONARY ROTOBLATOR INTERVENTION (PCI-R);  Surgeon: Blane Ohara, MD;  Location: [facility] CATH LAB;  Service: Cardiovascular;  Laterality: N/A;  . SKIN CANCER EXCISION     removed from forehead and right leg, nose/face  . TEE WITHOUT CARDIOVERSION N/A 01/22/2014   Procedure: TRANSESOPHAGEAL ECHOCARDIOGRAM (TEE);  Surgeon: Sanda Klein, MD;  Location: [facility] ENDOSCOPY;  Service: Cardiovascular;  Laterality: N/A;  . TONSILLECTOMY    . TRANSCATHETER AORTIC VALVE REPLACEMENT, TRANSFEMORAL N/A 04/22/2014   Procedure: TRANSCATHETER AORTIC VALVE REPLACEMENT, TRANSFEMORAL;  Surgeon: [name], MD;  Location: [place];  Service: Open Heart Surgery;  Laterality: N/A;  TAVR-TF (RIGHT SIDE)  . UPPER GASTROINTESTINAL ENDOSCOPY  07/14/2009   erosive reflux esopagitis, Barrett's esophagus    Family History: Family History  Problem Relation Age of Onset  . Breast cancer Mother   . Stroke Mother   . Transient ischemic attack Mother     Social History: Social History   Socioeconomic History  . Marital status: Married    Spouse name: Not on file  . Number of children: 4  . Years of education: Not on file  . Highest education level: Not on file  Social Needs  . Financial resource strain: Not on file  . Food insecurity -  worry: Not on file  . Food insecurity - inability: Not on file  . Transportation needs - medical: Not on file  . Transportation needs - non-medical: Not on file  Occupational History  . Occupation: Retired    Fish farm manager: RETIRED  Tobacco Use  . Smoking status: Former Smoker    Years: 2.00    Types: Pipe    Last attempt to quit: 11/07/1965    Years since quitting: 51.9  . Smokeless tobacco: Never Used  . Tobacco comment: quit over 40 years ago, never smoked cigarettes  Substance and Sexual Activity  . Alcohol use: No  . Drug use: No  . Sexual activity: Not on file  Other Topics Concern  . Not on file  Social History Narrative  . Not on file    Allergies: Amiodarone  Medications: I have reviewed the patient's current medications.  Vital Signs: Patient Vitals for the past 24 hrs:  BP Temp Temp src Pulse Resp SpO2  10/21/17 0350 (!) 108/58 98.6 F (37 C) Oral 92 (!) 24 99 %  10/21/17 0245 107/71 - - 78 (!) 28 96 %  10/21/17 0145 109/65 - - 79 (!) 24 91 %  10/21/17 0130 113/70 - - 90 18 93 %  10/21/17 0100 119/83 - - 89 20 92 %  10/20/17 2315 112/70 - - 83 16 94 %  10/20/17 2309 (!) 112/46 97.9 F (36.6 C) Oral 84 18 97 %  10/20/17 2255 - - - - - 95 %    Radiology: Dg Chest 1 View  Result Date: 10/18/2017 CLINICAL DATA:  Post thoracentesis EXAM: CHEST 1 VIEW COMPARISON:  Chest radiograph October 17, 2017 FINDINGS: Note that a portion of the left apex is not seen due to patient's mandible overlying this area. No pneumothorax appreciable. Right pleural effusion is smaller compared to 1 day prior. There is a new left pleural effusion, however. There is bibasilar consolidation, more on the left than on the right. There is cardiomegaly with pacemaker leads attached to the right atrium and right ventricle. There is an aortic valve replacement. Patient is status post median sternotomy. There is aortic atherosclerosis. No adenopathy evident. Bones are osteoporotic. IMPRESSION: Right  pleural effusion smaller. No pneumothorax. New left pleural effusion. There is bibasilar consolidation. Stable cardiomegaly. There is aortic atherosclerosis. Aortic Atherosclerosis (ICD10-I70.0). Electronically Signed   By: Lowella Grip III M.D.   On: 10/18/2017 13:11   Dg Chest 2 View  Result Date: 10/21/2017 CLINICAL DATA:  Status post fall, with concern for chest injury. Initial encounter. EXAM: CHEST  2 VIEW COMPARISON:  Chest radiograph performed 10/18/2017 FINDINGS: A moderate right-sided pleural effusion is noted. Vascular congestion is seen, with bilateral central airspace opacities. This may reflect pulmonary edema or possibly pneumonia. No pneumothorax is seen. The cardiomediastinal silhouette is mildly enlarged. A pacemaker is noted at the left chest wall, with leads ending at the right atrium and right ventricle. IMPRESSION: 1. Moderate right-sided pleural effusion. Vascular congestion and mild cardiomegaly, with bilateral central airspace opacities. This may reflect pulmonary edema or possibly pneumonia. 2. No displaced rib fracture seen. Electronically Signed   By: Garald Balding M.D.   On: 10/21/2017 01:33   Dg Chest 2 View  Result Date: 10/17/2017 CLINICAL DATA:  Shortness of Breath EXAM: CHEST  2 VIEW COMPARISON:  10/15/2017 FINDINGS: Moderate right pleural effusion again noted. There is cardiomegaly with bilateral perihilar and lower lobe airspace opacities, favor edema/ CHF. Prior aortic valve repair. Left pacer is unchanged. IMPRESSION: Continued CHF pattern.  Moderate right pleural effusion. Electronically Signed   By: Rolm Baptise M.D.   On: 10/17/2017 09:43   Dg Chest Port 1 View  Result Date: 10/15/2017 CLINICAL DATA:  83 year old male with increased shortness of breath. EXAM: PORTABLE CHEST 1 VIEW COMPARISON:  06/25/2017 FINDINGS: Stable do lead left-sided AICD device. The patient is status post median sternotomy Ann T AVR. Cardiomediastinal silhouette is enlarged but  stable. There has been interval increase of patchy opacity in the left mid lung. Right basilar effusion with associated atelectasis, with or without superimposed consolidation. This is stable from prior study. No pneumothorax. No acute osseous abnormalities. IMPRESSION: 1. Increasing left mid lung patchy opacity, focal edema versus consolidation. 2. Continued right pleural effusion is study stated atelectasis, with or without superimposed consolidation. 3. Other stable signs of CHF including cardiomegaly and vascular congestion. Electronically Signed   By: Kristopher Oppenheim M.D.   On: 10/15/2017 09:39   Dg Hip Unilat With Pelvis 2-3 Views Left  Result Date: 10/21/2017 CLINICAL DATA:  Status post fall, with  left hip pain. Initial encounter. EXAM: DG HIP (WITH OR WITHOUT PELVIS) 2-3V LEFT COMPARISON:  None. FINDINGS: There is a mildly displaced left femoral intertrochanteric fracture. The femoral heads remain seated within their respective acetabula. No additional fractures are seen. Diffuse calcification is seen along the abdominal aorta and its branches. The visualized bowel gas pattern is grossly unremarkable. The sacroiliac joints are not well characterized, but appear grossly unremarkable. The sacrum is difficult to assess. IMPRESSION: 1. Mildly displaced left femoral intertrochanteric fracture. 2. Diffuse aortic atherosclerosis. Electronically Signed   By: Garald Balding M.D.   On: 10/21/2017 01:38   Ir Thoracentesis Asp Pleural Space W/img Guide  Result Date: 10/18/2017 INDICATION: Patient with right pleural effusion. Request is made for therapeutic thoracentesis. EXAM: ULTRASOUND GUIDED THERAPEUTIC RIGHT THORACENTESIS MEDICATIONS: 10 mL 2% lidocaine COMPLICATIONS: None immediate. PROCEDURE: An ultrasound guided thoracentesis was thoroughly discussed with the patient and questions answered. The benefits, risks, alternatives and complications were also discussed. The patient understands and wishes to  proceed with the procedure. Written consent was obtained. Ultrasound was performed to localize and mark an adequate pocket of fluid in the right chest. The area was then prepped and draped in the normal sterile fashion. 2% Lidocaine was used for local anesthesia. Under ultrasound guidance a Safe-T-Centesis catheter was introduced. Thoracentesis was performed. The catheter was removed and a dressing applied. FINDINGS: A total of approximately 2.0 liters of amber fluid was removed. IMPRESSION: Successful ultrasound guided right therapeutic thoracentesis yielding 2.0 liters of pleural fluid. Read by:  Brynda Greathouse PA-C Electronically Signed   By: Markus Daft M.D.   On: 10/18/2017 15:34    Labs: Recent Labs    10/20/17 2322  WBC 6.9  RBC 3.48*  HCT 31.3*  PLT 129*   Recent Labs    10/19/17 0509 10/20/17 2322  NA 144 141  K 3.9 3.7  CL 94* 98*  CO2 45* 37*  BUN 22* 28*  CREATININE 0.78 0.77  GLUCOSE 111* 110*  CALCIUM 8.8* 8.1*   No results for input(s): LABPT, INR in the last 72 hours.  Review of Systems: ROS  Physical Exam: Neurologically intact ABD soft Sensation intact distally Dorsiflexion/Plantar flexion intact Compartment soft ROM intact of knee and foot Left leg laterally rotated.   Assessment and Plan: Left inter troch fracture present Pt will need surgical intervention Pt is not NPO currently Notes say the pt is tx with Plavix.  It is not currently on his med list. Dr. Rolena Infante is consulting with hip specialist from our practice  Ronette Deter, PAC for Melina Schools, MD [facility] [phone]  Given his significant medical and cardiac history I think it would be in his best interest to be optimized today hold any anticoagulants (Plavix, if still on) and plan to try and get hip fixed tomorrow or Monday  He has a displaced left intertrochanteric proximal femur fracture and will need ORIF for fracture management, pain relief, and functional  capabilities

## 2017-10-21 NOTE — H&P (Signed)
History and Physical    [name] [name] NAT:[id number] DOB: [birth year] DOA: 10/20/2017  PCP: [name], MD  Patient coming from: [name]  I have personally briefly reviewed patient's old medical records in [place]  Chief Complaint: Fall  HPI: [name] [name] is a 83 y.o. male with medical history significant of CHF, CAD, AVS s/p TAVR, SSS s/p PPM, A.Fib only on plavix due to h/o GIB.  Patient arrives to hospital via EMS following a mechanical fall after tripping on cords on floor last night.  Pain in L hip, worse with wt bearing, severe when he tries to stand.  Patient was just discharged from hospital after a stay for acute on chronic diastolic CHF.  Trop was elevated at 0.34, 0.29, 0.29 that admission.  Felt to be due to demand ischemia.  ED Course: L hip fx.   Review of Systems: As per HPI otherwise 10 point review of systems negative.   Past Medical History:  Diagnosis Date  . ANEMIA, IRON DEFICIENCY 10/13/2008  . ANXIETY 09/04/2008  . Aortic valvular stenosis    a. s/p TAVR 04/2014.  Marland Kitchen BARRETTS ESOPHAGUS 03/20/2009  . BASAL CELL CARCINOMA OF SKIN SITE UNSPECIFIED 09/28/2010  . Cardiomyopathy, ischemic 11/19/2013   EF 45-50% with inferior wall hypokinesis by echo 2014  . CATARACTS, BILATERAL, HX OF 01/14/2008  . CEREBROVASCULAR ACCIDENT, HX OF 08/18/2008  . Chronic diastolic CHF (congestive heart failure) ([place])   . COLONIC POLYPS, ADENOMATOUS 01/14/2008  . CORONARY ARTERY DISEASE    a. s/p single vessel CABG 2009 and bioprosthetic MVR. b. DES to proximal LAD 03/2014.  . Edema, peripheral 07/30/2009   LEV doppler- no thrombus or thrombophlebitis  . Elevated PSA   . Falls   . GASTRIC POLYP 05/13/2009  . GERD 06/01/2007  . History of hiatal hernia   . Hyperglycemia   . HYPERLIPIDEMIA 06/01/2007  . HYPERTENSION 06/01/2007  . 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  . Mitral valve disease 06/13/2008   a. mitral valve replacement #20 St. Jude  Biocor; Maze procedure by Dr. Amador Cunas  at [facility].  Marland Kitchen Pacemaker   . Peripheral artery disease ([place]) 06/16/2010   LEA doppler-left ABI nml 0.61 calcified vessels;right ABI  0.68  . Permanent atrial fibrillation (HCC)    a. previously on warfarin, anticoag d/c'd due to GIB, now felt to be permanent.  . Prostate cancer [facility])    s/p radiation  . Pulmonary hypertension ([place])   . Restless leg syndrome   . S/P CABG x 1 06/13/2008   SVG to LAD with open vein harvest right thigh, LIMA harvested but not utilized by Dr Amador Cunas  . S/P Maze operation for atrial fibrillation 06/13/2008   Partial left atrial lesion set using cryothermy for bilateral pulmonary vein isolation by Dr Amador Cunas  . S/P mitral valve replacement with bioprosthetic valve 06/13/2008   9mm St Jude Biocor porcine bioprosthetic tissue valve by Dr Amador Cunas  . 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 Tachy-brady syndrome ([place]) 07/13/2008   a. medtronic Adapta gen. model ADDR01, serial # [id number] H by Dr  [name] at [facility].  Marland Kitchen UNSPECIFIED VENOUS INSUFFICIENCY 09/18/2008    Past Surgical History:  Procedure Laterality Date  . CARDIAC CATHETERIZATION  11/01/1990  . CARDIOVASCULAR STRESS TEST  06/03/2010   Persantine perfusion EF 45% mild to moderate ischemia basal and mid inferolateral  region  . CARDIOVASCULAR STRESS TEST  01/01/2008   left ventricle normal,no significant ischemia  . CARDIOVASCULAR STRESS TEST  09/09/2005    possibility of ischemia inferior wall  . COLONOSCOPY  07/14/2009   diverticulosis, internal hemorrhoids  . CORONARY ARTERY BYPASS GRAFT  06/13/2008   graft x1 w/vein graft LAD artery by Dr. Amador Cunas at [facility]  . CORONARY STENT PLACEMENT  03/24/2014   DES to LAD  . CYSTECTOMY     Left leg  . INTRAOPERATIVE TRANSESOPHAGEAL ECHOCARDIOGRAM N/A 04/22/2014   Procedure: INTRAOPERATIVE TRANSTHORACIC  ECHOCARDIOGRAM;  Surgeon: [name], MD;  Location: [place];  Service: Open Heart Surgery;  Laterality: N/A;  . IR THORACENTESIS ASP PLEURAL SPACE W/IMG GUIDE  10/18/2017  . LARYNGOPLASTY Left 06/07/2017   Procedure: LARYNGOPLASTY;  Surgeon: Melida Quitter, MD;  Location: [place];  Service: ENT;  Laterality: Left;  LEFT MEDIALIZATION LARYNGOPLASTY  LOCAL WITH IV SEDATION  . LEFT AND RIGHT HEART CATHETERIZATION WITH CORONARY ANGIOGRAM N/A 01/08/2014   Procedure: LEFT AND RIGHT HEART CATHETERIZATION WITH CORONARY ANGIOGRAM;  Surgeon: Sanda Klein, MD;  Location: [place] CATH LAB;  Service: Cardiovascular;  Laterality: N/A;  . MITRAL VALVE REPLACEMENT  06/13/2008   #20 St. Jude Bicor mitral valve ;Maze procedure at [facility] by Dr. Amador Cunas  . PACEMAKER PLACEMENT  07/13/2008   [place] dual chamber-Medtronic at [facility]   . PERCUTANEOUS CORONARY ROTOBLATOR INTERVENTION (PCI-R) N/A 03/24/2014   Procedure: PERCUTANEOUS CORONARY ROTOBLATOR INTERVENTION (PCI-R);  Surgeon: Blane Ohara, MD;  Location: [facility] C[facility] LAB;  Service: Cardiovascular;  Laterality: N/A;  . SKIN CANCER EXCISION     removed from forehead and right leg, nose/face  . TEE WITHOUT CARDIOVERSION N/A 01/22/2014   Procedure: TRANSESOPHAGEAL ECHOCARDIOGRAM (TEE);  Surgeon: Sanda Klein, MD;  Location: [facility] ENDOSCOPY;  Service: Cardiovascular;  Laterality: N/A;  . TONSILLECTOMY    . TRANSCATHETER AORTIC VALVE REPLACEMENT, TRANSFEMORAL N/A 04/22/2014   Procedure: TRANSCATHETER AORTIC VALVE REPLACEMENT, TRANSFEMORAL;  Surgeon: [name], MD;  Location: [place];  Service: Open Heart Surgery;  Laterality: N/A;  TAVR-TF (RIGHT SIDE)  . UPPER GASTROINTESTINAL ENDOSCOPY  07/14/2009   erosive reflux esopagitis, Barrett's esophagus     reports that he quit smoking about 51 years ago. His smoking use included pipe. He quit after 2.00 years of use. he has never used smokeless tobacco. He reports that he does not drink alcohol or use drugs.  Allergies  Allergen  Reactions  . Amiodarone     Thyroid disorder    Family History  Problem Relation Age of Onset  . Breast cancer Mother   . Stroke Mother   . Transient ischemic attack Mother      Prior to Admission medications   Medication Sig Start Date End Date Taking? Authorizing Provider  clopidogrel (PLAVIX) 75 MG tablet Take 1 tablet (75 mg total) by mouth daily with breakfast. 07/29/16  Yes [name], [name], MD  feeding supplement, ENSURE ENLIVE, (ENSURE ENLIVE) LIQD Take 237 mLs by mouth 3 (three) times daily between meals. 10/19/17  Yes Dunn, Dayna N, PA-C  furosemide (LASIX) 40 MG tablet Take 1 tablet (40 mg total) by mouth daily. 10/19/17  Yes Dunn, Dayna N, PA-C  metoprolol succinate (TOPROL XL) 25 MG 24 hr tablet Take 1 tablet (25 mg total) by mouth daily. 08/28/17  Yes Barrett, Evelene Croon, PA-C  Multiple Vitamin (MULTIVITAMIN WITH MINERALS) TABS Take 1 tablet by mouth daily. Centrum Silver   Yes [provider]  pantoprazole (PROTONIX) 40 MG tablet Take 1 tablet (40  mg total) by mouth daily. 11/09/16  Yes [name], MD  potassium chloride SA (K-DUR,KLOR-CON) 20 MEQ tablet Take 20 mEq by mouth daily.   Yes [provider]  simvastatin (ZOCOR) 40 MG tablet Take 1 tablet (40 mg total) by mouth at bedtime. 07/19/17  Yes [name], MD  Tamsulosin HCl (FLOMAX) 0.4 MG CAPS Take 0.4 mg by mouth at bedtime.    Yes [provider]  triamcinolone (KENALOG) 0.025 % cream Apply 1 application topically 3 (three) times daily as needed. For rash 08/22/17  Yes [name], MD    Physical Exam: Vitals:   10/21/17 0100 10/21/17 0130 10/21/17 0145 10/21/17 0245  BP: 119/83 113/70 109/65 107/71  Pulse: 89 90 79 78  Resp: 20 18 (!) 24 (!) 28  Temp:      TempSrc:      SpO2: 92% 93% 91% 96%    Constitutional: NAD, calm, comfortable Eyes: PERRL, lids and conjunctivae normal ENMT: Mucous membranes are moist. Posterior pharynx clear of any exudate or lesions.Normal dentition.    Neck: normal, supple, no masses, no thyromegaly Respiratory: clear to auscultation bilaterally, no wheezing, no crackles. Normal respiratory effort. No accessory muscle use.  Cardiovascular: Regular rate and rhythm, no murmurs / rubs / gallops. No extremity edema. 2+ pedal pulses. No carotid bruits.  Abdomen: no tenderness, no masses palpated. No hepatosplenomegaly. Bowel sounds positive.  Musculoskeletal: no clubbing / cyanosis. No joint deformity upper and lower extremities. Good ROM, no contractures. Normal muscle tone.  Skin: no rashes, lesions, ulcers. No induration Neurologic: CN 2-12 grossly intact. Sensation intact, DTR normal. Strength 5/5 in all 4.  Psychiatric: Normal judgment and insight. Alert and oriented x 3. Normal mood.    Labs on Admission: I have personally reviewed following labs and imaging studies  CBC: Recent Labs  Lab 10/15/17 0941 10/15/17 0958 10/16/17 0328 10/20/17 2322  WBC 4.9  --  5.0 6.9  NEUTROABS 3.7  --   --  5.5  HGB 10.6* 11.2* 10.0* 9.5*  HCT 35.2* 33.0* 34.3* 31.3*  MCV 91.0  --  91.7 89.9  PLT 149*  --  136* 629*   Basic Metabolic Panel: Recent Labs  Lab 10/16/17 0328 10/17/17 0434 10/18/17 0439 10/19/17 0509 10/20/17 2322  NA 144 142 143 144 141  K 3.8 3.7 3.7 3.9 3.7  CL 100* 97* 95* 94* 98*  CO2 40* 38* 42* 45* 37*  GLUCOSE 98 121* 106* 111* 110*  BUN 22* 21* 21* 22* 28*  CREATININE 1.01 0.93 0.85 0.78 0.77  CALCIUM 8.2* 8.4* 8.4* 8.8* 8.1*   GFR: Estimated Creatinine Clearance: 62.7 mL/min (by C-G formula based on SCr of 0.77 mg/dL). Liver Function Tests: Recent Labs  Lab 10/15/17 0941  AST 25  ALT 16*  ALKPHOS 75  BILITOT 0.6  PROT 5.9*  ALBUMIN 3.2*   No results for input(s): LIPASE, AMYLASE in the last 168 hours. No results for input(s): AMMONIA in the last 168 hours. Coagulation Profile: Recent Labs  Lab 10/16/17 0328  INR 1.21   Cardiac Enzymes: Recent Labs  Lab 10/16/17 0909 10/16/17 1540  10/16/17 2017  TROPONINI 0.34* 0.29* 0.29*   BNP (last 3 results) Recent Labs    12/29/16 0955  PROBNP 185.0*   HbA1C: No results for input(s): HGBA1C in the last 72 hours. CBG: No results for input(s): GLUCAP in the last 168 hours. Lipid Profile: No results for input(s): CHOL, HDL, LDLCALC, TRIG, CHOLHDL, LDLDIRECT in the last 72 hours. Thyroid Function  Tests: No results for input(s): TSH, T4TOTAL, FREET4, T3FREE, THYROIDAB in the last 72 hours. Anemia Panel: No results for input(s): VITAMINB12, FOLATE, FERRITIN, TIBC, IRON, RETICCTPCT in the last 72 hours. Urine analysis:    Component Value Date/Time   COLORURINE YELLOW 06/25/2017 [place] 06/25/2017 1322   LABSPEC 1.009 06/25/2017 1322   PHURINE 5.0 06/25/2017 1322   GLUCOSEU NEGATIVE 06/25/2017 1322   GLUCOSEU NEGATIVE 04/03/2015 1106   HGBUR NEGATIVE 06/25/2017 1322   HGBUR large 10/21/2008 0911   BILIRUBINUR NEGATIVE 06/25/2017 1322   KETONESUR NEGATIVE 06/25/2017 1322   PROTEINUR NEGATIVE 06/25/2017 1322   UROBILINOGEN 1.0 04/03/2015 2021   NITRITE NEGATIVE 06/25/2017 1322   LEUKOCYTESUR NEGATIVE 06/25/2017 1322    Radiological Exams on Admission: Dg Chest 2 View  Result Date: 10/21/2017 CLINICAL DATA:  Status post fall, with concern for chest injury. Initial encounter. EXAM: CHEST  2 VIEW COMPARISON:  Chest radiograph performed 10/18/2017 FINDINGS: A moderate right-sided pleural effusion is noted. Vascular congestion is seen, with bilateral central airspace opacities. This may reflect pulmonary edema or possibly pneumonia. No pneumothorax is seen. The cardiomediastinal silhouette is mildly enlarged. A pacemaker is noted at the left chest wall, with leads ending at the right atrium and right ventricle. IMPRESSION: 1. Moderate right-sided pleural effusion. Vascular congestion and mild cardiomegaly, with bilateral central airspace opacities. This may reflect pulmonary edema or possibly pneumonia. 2. No  displaced rib fracture seen. Electronically Signed   By: Garald Balding M.D.   On: 10/21/2017 01:33   Dg Hip Unilat With Pelvis 2-3 Views Left  Result Date: 10/21/2017 CLINICAL DATA:  Status post fall, with left hip pain. Initial encounter. EXAM: DG HIP (WITH OR WITHOUT PELVIS) 2-3V LEFT COMPARISON:  None. FINDINGS: There is a mildly displaced left femoral intertrochanteric fracture. The femoral heads remain seated within their respective acetabula. No additional fractures are seen. Diffuse calcification is seen along the abdominal aorta and its branches. The visualized bowel gas pattern is grossly unremarkable. The sacroiliac joints are not well characterized, but appear grossly unremarkable. The sacrum is difficult to assess. IMPRESSION: 1. Mildly displaced left femoral intertrochanteric fracture. 2. Diffuse aortic atherosclerosis. Electronically Signed   By: Garald Balding M.D.   On: 10/21/2017 01:38    EKG: Independently reviewed.  Assessment/Plan Principal Problem:   Closed left hip fracture, initial encounter [facility]) Active Problems:   Pacemaker - Medtronic Adapta, dual chamber   Chronic diastolic heart failure (HCC)   CAD (coronary artery disease)    1. L hip fx - 1. Hip fx pathway 2. Per ortho OR full tomorrow, they will consult on patient in AM 3. Therefore will let patient eat and put on [place] heparin for DVT ppx for now 2. Chronic diastolic CHF - 1. Continue home lasix for today while eating 2. Just got discharged for CHF exacerbation, though now currently asymptomatic 3. CAD - 1. No symptoms currently 2. But just had admission for CHF and demand ischemia  DVT prophylaxis: [place] heparin Code Status: DNR - living will, and yellow form at bedside Family Communication: No family in room Disposition Plan: SNF after admit Consults called: EDP spoke with ortho Admission status: Admit to inpatient   [place], [place] Hospital[facility] Pager [phone]  If 7AM-7PM, please  contact day team taking care of patient [email or web address] Password [email or web address]  10/21/2017, 2:53 AM

## 2017-10-21 NOTE — Progress Notes (Addendum)
Initial Nutrition Assessment  DOCUMENTATION CODES:   Severe malnutrition in context of chronic illness  INTERVENTION:    Continue Ensure Enlive po TID, each supplement provides 350 kcal and 20 grams of protein  NUTRITION DIAGNOSIS:   Severe Malnutrition related to chronic illness(CHF) as evidenced by severe fat depletion, severe muscle depletion  GOAL:   Patient will meet greater than or equal to 90% of their needs  MONITOR:   PO intake, Supplement acceptance, Labs, Weight trends, Skin  REASON FOR ASSESSMENT:   Consult Hip fracture protocol  ASSESSMENT:   82 yo Male who presented to the hospital after a trama in his room at the SNF.  He reports severe left hip pain which increases with weight bearing.  The pt also has a chronic hx of CHF, CAD, AVS. Hx of CVA.   Pt was recently admitted with SOB, CHF exacerbation. Reports his appetite is good, however, during last admission PO intake was poor Pt typically consumes 3 meals per day at the cafeteria at his ALF.  States he's lost some weight. Per readings below, wt has fluctuated likely due to fluid shifts. Medications reviewed and include MVI daily. Labs reviewed. Cl 98 (L). Plan is for surgery (ie ORIF).  NUTRITION - FOCUSED PHYSICAL EXAM:    Most Recent Value  Orbital Region  No depletion  Upper Arm Region  Severe depletion  Thoracic and Lumbar Region  Severe depletion  Buccal Region  Severe depletion  Temple Region  Severe depletion  Clavicle Bone Region  Severe depletion  Clavicle and Acromion Bone Region  Severe depletion  Scapular Bone Region  Severe depletion  Dorsal Hand  Moderate depletion  Patellar Region  Moderate depletion  Anterior Thigh Region  Moderate depletion  Posterior Calf Region  Moderate depletion  Edema (RD Assessment)  None     Diet Order:  Diet Heart Room service appropriate? Yes; Fluid consistency: Thin  EDUCATION NEEDS:   No education needs have been identified at this time  Skin:   Skin Assessment: Skin Integrity Issues: Skin Integrity Issues:: Other (Comment) Other: flaky, cracking skin to bilateral LE  Last BM:  12/11  Height:   Ht Readings from Last 1 Encounters:  10/21/17 5\' 10"  (1.778 m)    Weight:   Wt Readings from Last 1 Encounters:  10/21/17 147 lb 7.8 oz (66.9 kg)   Wt Readings from Last 10 Encounters:  10/21/17 147 lb 7.8 oz (66.9 kg)  10/19/17 147 lb 7.8 oz (66.9 kg)  09/11/17 152 lb 12.8 oz (69.3 kg)  08/28/17 157 lb 6.4 oz (71.4 kg)  08/22/17 162 lb 6.4 oz (73.7 kg)  06/27/17 141 lb 4.8 oz (64.1 kg)  06/13/17 155 lb (70.3 kg)  06/07/17 155 lb 6 oz (70.5 kg)  06/01/17 157 lb 8 oz (71.4 kg)  04/26/17 143 lb 11.8 oz (65.2 kg)   Ideal Body Weight:  75.5 kg  BMI:  Body mass index is 21.16 kg/m.  Estimated Nutritional Needs:   Kcal:  5366-4403  Protein:  85-100 gm  Fluid:  1.8-2.0 L  Arthur Holms, RD, LDN Pager #: [phone] After-Hours Pager #: [phone]

## 2017-10-21 NOTE — Progress Notes (Signed)
Pt see and examined at the bedside. Pt admitted after midnight. For details, please refer to admission note done 10/21/17.  Pt admitted from SNF with severe left leg pain. He was found to have \\ildly  displaced left femoral intertrochanteric fracture. Plan for surgery per ortho schedule.  Leisa Lenz [facility] [phone]

## 2017-10-21 NOTE — Progress Notes (Signed)
Orthopedic Tech Progress Note Patient Details:  [name] [name] 09/01/1929 [id number]  Ortho Devices Ortho Device/Splint Location: trapeze bar   Post Interventions Instructions Provided: Care of device, Adjustment of device   [name] 10/21/2017, 4:41 PM

## 2017-10-22 ENCOUNTER — Encounter (HOSPITAL_COMMUNITY): Payer: Self-pay | Admitting: *Deleted

## 2017-10-22 LAB — BASIC METABOLIC PANEL
Anion gap: 6 (ref 5–15)
BUN: 29 mg/dL — ABNORMAL HIGH (ref 6–20)
CO2: 41 mmol/L — ABNORMAL HIGH (ref 22–32)
Calcium: 8.4 mg/dL — ABNORMAL LOW (ref 8.9–10.3)
Chloride: 96 mmol/L — ABNORMAL LOW (ref 101–111)
Creatinine, Ser: 0.73 mg/dL (ref 0.61–1.24)
GFR calc Af Amer: >60 mL/min (ref >60)
GFR calc non Af Amer: >60 mL/min (ref >60)
Glucose, Bld: 109 mg/dL — ABNORMAL HIGH (ref 65–99)
Potassium: 3.8 mmol/L (ref 3.5–5.1)
Sodium: 143 mmol/L (ref 135–145)

## 2017-10-22 LAB — CBC
HCT: 28.6 % — ABNORMAL LOW (ref 39.0–52.0)
Hemoglobin: 8.6 g/dL — ABNORMAL LOW (ref 13.0–17.0)
MCH: 27.3 pg (ref 26.0–34.0)
MCHC: 30.1 g/dL (ref 30.0–36.0)
MCV: 90.8 fL (ref 78.0–100.0)
Platelets: 121 10*3/uL — ABNORMAL LOW (ref 150–400)
RBC: 3.15 MIL/uL — ABNORMAL LOW (ref 4.22–5.81)
RDW: 16.3 % — ABNORMAL HIGH (ref 11.5–15.5)
WBC: 5.3 10*3/uL (ref 4.0–10.5)

## 2017-10-22 LAB — SURGICAL PCR SCREEN
MRSA, PCR: NEGATIVE
Staphylococcus aureus: NEGATIVE

## 2017-10-22 MED ORDER — CEFAZOLIN SODIUM-DEXTROSE 2-4 GM/100ML-% IV SOLN
2.0000 g | INTRAVENOUS | Status: AC
  Administered 2017-10-23: 2 g via INTRAVENOUS
  Filled 2017-10-22 (×2): qty 100

## 2017-10-22 MED ORDER — PROPOFOL 10 MG/ML IV BOLUS
INTRAVENOUS | Status: AC
  Filled 2017-10-22: qty 20

## 2017-10-22 MED ORDER — FENTANYL CITRATE (PF) 250 MCG/5ML IJ SOLN
INTRAMUSCULAR | Status: AC
  Filled 2017-10-22: qty 5

## 2017-10-22 MED ORDER — POVIDONE-IODINE 10 % EX SWAB: 2.0000 "application " | Freq: Once | CUTANEOUS | Status: DC

## 2017-10-22 MED ORDER — CHLORHEXIDINE GLUCONATE 4 % EX LIQD
60.0000 mL | Freq: Once | CUTANEOUS | Status: AC
  Administered 2017-10-22: 4 via TOPICAL
  Filled 2017-10-22: qty 60

## 2017-10-22 NOTE — Progress Notes (Addendum)
PROGRESS NOTE    [name] [name]  BDZ:[id number] DOB: 22-May-1929 DOA: 10/20/2017  PCP: [name], MD   Brief Narrative:  Pt admitted from SNF with severe left leg pain. He was found to have \\ildly  displaced left femoral intertrochanteric fracture. Plan for surgery per ortho schedule.  Assessment & Plan:   Principal Problem:   Closed left hip fracture, initial encounter ([place]) - X ray showed mildly displaced left femoral intertrochanteric fracture - Plan for surgery tomorrow - Continue current pain management efforts  - NPO after midnight   Active Problems:   Pacemaker - Medtronic Adapta, dual chamber / Chronic diastolic heart failure (HCC) - Stable resp status - Continue lasix     CAD (coronary artery disease) - Likely demand ischemia - Continue metoprolol     DVT prophylaxis: Heparin subQ Code Status: DNR/DNI Family Communication: no family at the bedside Disposition Plan: surgery tomorrow    Consultants:   Ortho  Procedures:   None   Antimicrobials:   None    Subjective: No overnight events.  Objective: Vitals:   10/21/17 1300 10/21/17 1616 10/21/17 2036 10/22/17 0600  BP: 130/73 (!) 112/58 139/64 122/80  Pulse: 85 85 79 84  Resp: 18 18    Temp: (!) 97.2 F (36.2 C) (!) 97.5 F (36.4 C) 98.2 F (36.8 C) 97.8 F (36.6 C)  TempSrc: Oral Oral Oral Oral  SpO2: 95% 95% 97% 100%  Weight:      Height:        Intake/Output Summary (Last 24 hours) at 10/22/2017 1310 Last data filed at 10/22/2017 0715 Gross per 24 hour  Intake 240 ml  Output 400 ml  Net -160 ml   Filed Weights   10/21/17 1145  Weight: 66.9 kg (147 lb 7.8 oz)    Examination:  General exam: Appears calm and comfortable  Respiratory system: Clear to auscultation. Respiratory effort normal. Cardiovascular system: S1 & S2 heard, Rate controlled  Gastrointestinal system: Abdomen is nondistended, soft and nontender. No organomegaly or masses felt. Normal bowel sounds  heard. Central nervous system: No focal neurological deficits. Extremities: left leg pain Skin: No rashes, lesions or ulcers Psychiatry: Judgement and insight appear normal. Mood & affect appropriate.   Data Reviewed: I have personally reviewed following labs and imaging studies  CBC: Recent Labs  Lab 10/16/17 0328 10/20/17 2322 10/22/17 0708  WBC 5.0 6.9 5.3  NEUTROABS  --  5.5  --   HGB 10.0* 9.5* 8.6*  HCT 34.3* 31.3* 28.6*  MCV 91.7 89.9 90.8  PLT 136* 129* 341*   Basic Metabolic Panel: Recent Labs  Lab 10/17/17 0434 10/18/17 0439 10/19/17 0509 10/20/17 2322 10/22/17 0708  NA 142 143 144 141 143  K 3.7 3.7 3.9 3.7 3.8  CL 97* 95* 94* 98* 96*  CO2 38* 42* 45* 37* 41*  GLUCOSE 121* 106* 111* 110* 109*  BUN 21* 21* 22* 28* 29*  CREATININE 0.93 0.85 0.78 0.77 0.73  CALCIUM 8.4* 8.4* 8.8* 8.1* 8.4*   GFR: Estimated Creatinine Clearance: 62.7 mL/min (by C-G formula based on SCr of 0.73 mg/dL). Liver Function Tests: No results for input(s): AST, ALT, ALKPHOS, BILITOT, PROT, ALBUMIN in the last 168 hours. No results for input(s): LIPASE, AMYLASE in the last 168 hours. No results for input(s): AMMONIA in the last 168 hours. Coagulation Profile: Recent Labs  Lab 10/16/17 0328  INR 1.21   Cardiac Enzymes: Recent Labs  Lab 10/16/17 0909 10/16/17 1540 10/16/17 2017  TROPONINI 0.34* 0.29* 0.29*  BNP (last 3 results) Recent Labs    12/29/16 0955  PROBNP 185.0*   HbA1C: No results for input(s): HGBA1C in the last 72 hours. CBG: No results for input(s): GLUCAP in the last 168 hours. Lipid Profile: No results for input(s): CHOL, HDL, LDLCALC, TRIG, CHOLHDL, LDLDIRECT in the last 72 hours. Thyroid Function Tests: No results for input(s): TSH, T4TOTAL, FREET4, T3FREE, THYROIDAB in the last 72 hours. Anemia Panel: No results for input(s): VITAMINB12, FOLATE, FERRITIN, TIBC, IRON, RETICCTPCT in the last 72 hours. Urine analysis:    Component Value Date/Time    COLORURINE YELLOW 06/25/2017 1322   APPEARANCEUR CLEAR 06/25/2017 1322   LABSPEC 1.009 06/25/2017 1322   PHURINE 5.0 06/25/2017 1322   GLUCOSEU NEGATIVE 06/25/2017 1322   GLUCOSEU NEGATIVE 04/03/2015 1106   HGBUR NEGATIVE 06/25/2017 1322   HGBUR large 10/21/2008 0911   BILIRUBINUR NEGATIVE 06/25/2017 1322   KETONESUR NEGATIVE 06/25/2017 1322   PROTEINUR NEGATIVE 06/25/2017 1322   UROBILINOGEN 1.0 04/03/2015 2021   NITRITE NEGATIVE 06/25/2017 1322   LEUKOCYTESUR NEGATIVE 06/25/2017 1322   Sepsis Labs: @LABRCNTIP (procalcitonin:4,lacticidven:4)   ) Recent Results (from the past 240 hour(s))  MRSA PCR Screening     Status: None   Collection Time: 10/15/17  1:29 PM  Result Value Ref Range Status   MRSA by PCR NEGATIVE NEGATIVE Final    Comment:        The GeneXpert MRSA Assay (FDA approved for NASAL specimens only), is one component of a comprehensive MRSA colonization surveillance program. It is not intended to diagnose MRSA infection nor to guide or monitor treatment for MRSA infections.   Surgical pcr screen     Status: None   Collection Time: 10/22/17  9:49 AM  Result Value Ref Range Status   MRSA, PCR NEGATIVE NEGATIVE Final   Staphylococcus aureus NEGATIVE NEGATIVE Final    Comment: (NOTE) The Xpert SA Assay (FDA approved for NASAL specimens in patients 50 years of age and older), is one component of a comprehensive surveillance program. It is not intended to diagnose infection nor to guide or monitor treatment.       Radiology Studies: Dg Chest 2 View  Result Date: 10/21/2017 CLINICAL DATA:  Status post fall, with concern for chest injury. Initial encounter. EXAM: CHEST  2 VIEW COMPARISON:  Chest radiograph performed 10/18/2017 FINDINGS: A moderate right-sided pleural effusion is noted. Vascular congestion is seen, with bilateral central airspace opacities. This may reflect pulmonary edema or possibly pneumonia. No pneumothorax is seen. The cardiomediastinal  silhouette is mildly enlarged. A pacemaker is noted at the left chest wall, with leads ending at the right atrium and right ventricle. IMPRESSION: 1. Moderate right-sided pleural effusion. Vascular congestion and mild cardiomegaly, with bilateral central airspace opacities. This may reflect pulmonary edema or possibly pneumonia. 2. No displaced rib fracture seen. Electronically Signed   By: Garald Balding M.D.   On: 10/21/2017 01:33   Dg Hip Unilat With Pelvis 2-3 Views Left  Result Date: 10/21/2017 CLINICAL DATA:  Status post fall, with left hip pain. Initial encounter. EXAM: DG HIP (WITH OR WITHOUT PELVIS) 2-3V LEFT COMPARISON:  None. FINDINGS: There is a mildly displaced left femoral intertrochanteric fracture. The femoral heads remain seated within their respective acetabula. No additional fractures are seen. Diffuse calcification is seen along the abdominal aorta and its branches. The visualized bowel gas pattern is grossly unremarkable. The sacroiliac joints are not well characterized, but appear grossly unremarkable. The sacrum is difficult to assess. IMPRESSION: 1. Mildly displaced  left femoral intertrochanteric fracture. 2. Diffuse aortic atherosclerosis. Electronically Signed   By: Garald Balding M.D.   On: 10/21/2017 01:38        Scheduled Meds: . [START ON 10/23/2017] chlorhexidine  60 mL Topical Once  . feeding supplement (ENSURE ENLIVE)  237 mL Oral TID BM  . furosemide  40 mg Oral Daily  . heparin  5,000 Units Subcutaneous Q8H  . metoprolol succinate  25 mg Oral Daily  . multivitamin with minerals  1 tablet Oral Daily  . pantoprazole  40 mg Oral Daily  . potassium chloride SA  20 mEq Oral Daily  . [START ON 10/23/2017] povidone-iodine  2 application Topical Once  . simvastatin  40 mg Oral QHS  . tamsulosin  0.4 mg Oral QHS   Continuous Infusions: . [START ON 10/23/2017]  ceFAZolin (ANCEF) IV       LOS: 1 day    Time spent: 15 minutes  Greater than 50% of the time spent  on counseling and coordinating the care.   Leisa Lenz, MD [facility] Pager [phone]  If 7PM-7AM, please contact night-coverage [email or web address] Password [email or web address] 10/22/2017, 1:10 PM

## 2017-10-22 NOTE — Progress Notes (Signed)
Patient ID: [name] [name], male   DOB: 04-17-1929, 83 y.o.   MRN: [id number] Subjective:   Procedure(s) (LRB): INTRAMEDULLARY (IM) NAIL INTERTROCHANTRIC (Left)    Patient reports pain as moderate. No events noted, ready to get left hip fixed today  Objective:   VITALS:   Vitals:   10/21/17 2036 10/22/17 0600  BP: 139/64 122/80  Pulse: 79 84  Resp:    Temp: 98.2 F (36.8 C) 97.8 F (36.6 C)  SpO2: 97% 100%    Neurovascular intact  LLE shortened and externally rotated  LABS Recent Labs    10/20/17 2322  HGB 9.5*  HCT 31.3*  WBC 6.9  PLT 129*    Recent Labs    10/20/17 2322  NA 141  K 3.7  BUN 28*  CREATININE 0.77  GLUCOSE 110*    No results for input(s): LABPT, INR in the last 72 hours.   Assessment/Plan:   Procedure(s) (LRB): INTRAMEDULLARY (IM) NAIL INTERTROCHANTRIC (Left)  Plan: To OR today for ORIF left hip NPO Consent ordered  Post op plan to follow the procedure

## 2017-10-22 NOTE — Progress Notes (Signed)
Patient ID: [name] [name], male   DOB: [birth year], 83 y.o.   MRN: [id number]  OR case cancelled this am as he was given a breakfast that he unknowingly ate  I am unavailable this pm to perform surgery If I am unable to find someone who can help then we will repost for tomorrow  Otherwise continue medical optimization of patient Diet orders to follow once I am can determine timing

## 2017-10-22 NOTE — Progress Notes (Signed)
Patient ID: [name] [name], male   DOB: [birth year], 83 y.o.   MRN: [id number]   Case to be performed tomorrow pm with Dr. Stann Mainland  regular diet today NPO after midnight  Further urgent orders to follow

## 2017-10-23 ENCOUNTER — Encounter (HOSPITAL_COMMUNITY)
Admission: EM | Disposition: A | Discharge status: Skilled Nursing Facility | Payer: Self-pay | Source: Home / Self Care | Attending: Internal Medicine

## 2017-10-23 ENCOUNTER — Inpatient Hospital Stay (HOSPITAL_COMMUNITY): Payer: Medicare Other | Admitting: Certified Registered Nurse Anesthetist

## 2017-10-23 ENCOUNTER — Encounter (HOSPITAL_COMMUNITY): Payer: Self-pay | Admitting: Anesthesiology

## 2017-10-23 ENCOUNTER — Inpatient Hospital Stay (HOSPITAL_COMMUNITY): Payer: Medicare Other

## 2017-10-23 HISTORY — PX: INTRAMEDULLARY (IM) NAIL INTERTROCHANTERIC: SHX5875

## 2017-10-23 LAB — CBC
HCT: 28.1 % — ABNORMAL LOW (ref 39.0–52.0)
HEMOGLOBIN: 8.4 g/dL — AB (ref 13.0–17.0)
MCH: 27.3 pg (ref 26.0–34.0)
MCHC: 29.9 g/dL — AB (ref 30.0–36.0)
MCV: 91.2 fL (ref 78.0–100.0)
Platelets: 135 10*3/uL — ABNORMAL LOW (ref 150–400)
RBC: 3.08 MIL/uL — AB (ref 4.22–5.81)
RDW: 16.2 % — ABNORMAL HIGH (ref 11.5–15.5)
WBC: 5 10*3/uL (ref 4.0–10.5)

## 2017-10-23 LAB — BASIC METABOLIC PANEL
Anion gap: 5 (ref 5–15)
BUN: 27 mg/dL — AB (ref 6–20)
CO2: 40 mmol/L — ABNORMAL HIGH (ref 22–32)
CREATININE: 0.8 mg/dL (ref 0.61–1.24)
Calcium: 8.3 mg/dL — ABNORMAL LOW (ref 8.9–10.3)
Chloride: 97 mmol/L — ABNORMAL LOW (ref 101–111)
GFR calc Af Amer: >60 mL/min (ref >60)
GFR calc non Af Amer: >60 mL/min (ref >60)
GLUCOSE: 95 mg/dL (ref 65–99)
POTASSIUM: 4.1 mmol/L (ref 3.5–5.1)
SODIUM: 142 mmol/L (ref 135–145)

## 2017-10-23 LAB — PREPARE RBC (CROSSMATCH)

## 2017-10-23 SURGERY — FIXATION, FRACTURE, INTERTROCHANTERIC, WITH INTRAMEDULLARY ROD
Anesthesia: General | Laterality: Left

## 2017-10-23 MED ORDER — ASPIRIN 325 MG PO TABS
325.0000 mg | ORAL_TABLET | Freq: Every day | ORAL | Status: DC
  Administered 2017-10-24 – 2017-10-25 (×2): 325 mg via ORAL
  Filled 2017-10-23 (×2): qty 1

## 2017-10-23 MED ORDER — ACETAMINOPHEN 650 MG RE SUPP: 650.0000 mg | RECTAL | Status: DC | PRN

## 2017-10-23 MED ORDER — PROMETHAZINE HCL 25 MG/ML IJ SOLN: 6.2500 mg | INTRAMUSCULAR | Status: DC | PRN

## 2017-10-23 MED ORDER — ACETAMINOPHEN 325 MG PO TABS
650.0000 mg | ORAL_TABLET | ORAL | Status: DC | PRN
  Administered 2017-10-23 – 2017-10-24 (×2): 650 mg via ORAL
  Filled 2017-10-23 (×2): qty 2

## 2017-10-23 MED ORDER — LIDOCAINE HCL (CARDIAC) 20 MG/ML IV SOLN
INTRAVENOUS | Status: DC | PRN
  Administered 2017-10-23: 30 mg via INTRAVENOUS

## 2017-10-23 MED ORDER — PHENYLEPHRINE HCL 10 MG/ML IJ SOLN
INTRAVENOUS | Status: DC | PRN
  Administered 2017-10-23: 75 ug/min via INTRAVENOUS

## 2017-10-23 MED ORDER — CHLORHEXIDINE GLUCONATE 4 % EX LIQD
60.0000 mL | Freq: Once | CUTANEOUS | Status: AC
  Administered 2017-10-23: 4 via TOPICAL

## 2017-10-23 MED ORDER — PROPOFOL 10 MG/ML IV BOLUS
INTRAVENOUS | Status: AC
  Filled 2017-10-23: qty 20

## 2017-10-23 MED ORDER — METOCLOPRAMIDE HCL 5 MG PO TABS: 5.0000 mg | ORAL_TABLET | Freq: Three times a day (TID) | ORAL | Status: DC | PRN

## 2017-10-23 MED ORDER — FENTANYL CITRATE (PF) 100 MCG/2ML IJ SOLN: 25.0000 ug | INTRAMUSCULAR | Status: DC | PRN

## 2017-10-23 MED ORDER — ROCURONIUM BROMIDE 100 MG/10ML IV SOLN
INTRAVENOUS | Status: DC | PRN
  Administered 2017-10-23: 20 mg via INTRAVENOUS

## 2017-10-23 MED ORDER — PROPOFOL 10 MG/ML IV BOLUS
INTRAVENOUS | Status: DC | PRN
  Administered 2017-10-23: 75 mg via INTRAVENOUS

## 2017-10-23 MED ORDER — FENTANYL CITRATE (PF) 100 MCG/2ML IJ SOLN
INTRAMUSCULAR | Status: DC | PRN
Start: 2017-10-23 — End: 2017-10-23
  Administered 2017-10-23: 50 ug via INTRAVENOUS

## 2017-10-23 MED ORDER — POVIDONE-IODINE 10 % EX SWAB
2.0000 "application " | Freq: Once | CUTANEOUS | Status: AC
  Administered 2017-10-23: 2 via TOPICAL

## 2017-10-23 MED ORDER — CEFAZOLIN SODIUM-DEXTROSE 1-4 GM/50ML-% IV SOLN
1.0000 g | Freq: Four times a day (QID) | INTRAVENOUS | Status: AC
  Administered 2017-10-24 (×3): 1 g via INTRAVENOUS
  Filled 2017-10-23 (×4): qty 50

## 2017-10-23 MED ORDER — 0.9 % SODIUM CHLORIDE (POUR BTL) OPTIME
TOPICAL | Status: DC | PRN
  Administered 2017-10-23: 1000 mL

## 2017-10-23 MED ORDER — SUCCINYLCHOLINE CHLORIDE 20 MG/ML IJ SOLN
INTRAMUSCULAR | Status: DC | PRN
  Administered 2017-10-23: 100 mg via INTRAVENOUS

## 2017-10-23 MED ORDER — METOCLOPRAMIDE HCL 5 MG/ML IJ SOLN: 5.0000 mg | Freq: Three times a day (TID) | INTRAMUSCULAR | Status: DC | PRN

## 2017-10-23 MED ORDER — LACTATED RINGERS IV SOLN
INTRAVENOUS | Status: DC | PRN
  Administered 2017-10-23: 19:00:00 via INTRAVENOUS

## 2017-10-23 MED ORDER — SUGAMMADEX SODIUM 200 MG/2ML IV SOLN
INTRAVENOUS | Status: DC | PRN
  Administered 2017-10-23: 133.8 mg via INTRAVENOUS

## 2017-10-23 MED ORDER — PHENYLEPHRINE HCL 10 MG/ML IJ SOLN
INTRAMUSCULAR | Status: DC | PRN
  Administered 2017-10-23: 100 ug via INTRAVENOUS

## 2017-10-23 MED ORDER — ONDANSETRON HCL 4 MG/2ML IJ SOLN: 4.0000 mg | Freq: Four times a day (QID) | INTRAMUSCULAR | Status: DC | PRN

## 2017-10-23 MED ORDER — FENTANYL CITRATE (PF) 250 MCG/5ML IJ SOLN
INTRAMUSCULAR | Status: AC
  Filled 2017-10-23: qty 5

## 2017-10-23 MED ORDER — ONDANSETRON HCL 4 MG PO TABS: 4.0000 mg | ORAL_TABLET | Freq: Four times a day (QID) | ORAL | Status: DC | PRN

## 2017-10-23 MED ORDER — ALBUMIN HUMAN 5 % IV SOLN
INTRAVENOUS | Status: DC | PRN
  Administered 2017-10-23: 20:00:00 via INTRAVENOUS

## 2017-10-23 SURGICAL SUPPLY — 47 items
BIT DRILL FLUTED FEMUR 4.2/3 (BIT) ×2 IMPLANT
BLADE SURG 15 STRL LF DISP TIS (BLADE) ×1 IMPLANT
BLADE SURG 15 STRL SS (BLADE) ×3
BNDG COHESIVE 4X5 TAN NS LF (GAUZE/BANDAGES/DRESSINGS) ×3 IMPLANT
BNDG COHESIVE 6X5 TAN STRL LF (GAUZE/BANDAGES/DRESSINGS) IMPLANT
BNDG GAUZE ELAST 4 BULKY (GAUZE/BANDAGES/DRESSINGS) ×3 IMPLANT
COVER PERINEAL POST (MISCELLANEOUS) ×3 IMPLANT
COVER SURGICAL LIGHT HANDLE (MISCELLANEOUS) ×3 IMPLANT
DRAPE HALF SHEET 40X57 (DRAPES) IMPLANT
DRAPE STERI IOBAN 125X83 (DRAPES) ×3 IMPLANT
DRSG MEPILEX BORDER 4X4 (GAUZE/BANDAGES/DRESSINGS) ×3 IMPLANT
DRSG MEPILEX BORDER 4X8 (GAUZE/BANDAGES/DRESSINGS) ×3 IMPLANT
DRSG PAD ABDOMINAL 8X10 ST (GAUZE/BANDAGES/DRESSINGS) ×6 IMPLANT
DURAPREP 26ML APPLICATOR (WOUND CARE) ×3 IMPLANT
ELECT CAUTERY BLADE 6.4 (BLADE) ×3 IMPLANT
ELECT REM PT RETURN 9FT ADLT (ELECTROSURGICAL) ×3
ELECTRODE REM PT RTRN 9FT ADLT (ELECTROSURGICAL) ×1 IMPLANT
FACESHIELD WRAPAROUND (MASK) ×3 IMPLANT
FACESHIELD WRAPAROUND OR TEAM (MASK) ×1 IMPLANT
GAUZE SPONGE 4X4 12PLY STRL (GAUZE/BANDAGES/DRESSINGS) ×2 IMPLANT
GAUZE XEROFORM 1X8 LF (GAUZE/BANDAGES/DRESSINGS) ×2 IMPLANT
GAUZE XEROFORM 5X9 LF (GAUZE/BANDAGES/DRESSINGS) ×3 IMPLANT
GLOVE BIO SURGEON STRL SZ7.5 (GLOVE) ×3 IMPLANT
GLOVE BIOGEL PI IND STRL 8 (GLOVE) ×1 IMPLANT
GLOVE BIOGEL PI INDICATOR 8 (GLOVE) ×2
GOWN STRL REUS W/ TWL LRG LVL3 (GOWN DISPOSABLE) ×2 IMPLANT
GOWN STRL REUS W/ TWL XL LVL3 (GOWN DISPOSABLE) ×1 IMPLANT
GOWN STRL REUS W/TWL LRG LVL3 (GOWN DISPOSABLE) ×6
GOWN STRL REUS W/TWL XL LVL3 (GOWN DISPOSABLE) ×3
GUIDEWIRE 3.2X400 (WIRE) ×2 IMPLANT
KIT BASIN OR (CUSTOM PROCEDURE TRAY) ×3 IMPLANT
KIT ROOM TURNOVER OR (KITS) ×3 IMPLANT
LINER BOOT UNIVERSAL DISP (MISCELLANEOUS) ×3 IMPLANT
MANIFOLD NEPTUNE II (INSTRUMENTS) ×3 IMPLANT
NAIL TROCH FIX 11X235 LT 130 (Nail) ×2 IMPLANT
NS IRRIG 1000ML POUR BTL (IV SOLUTION) ×3 IMPLANT
PACK GENERAL/GYN (CUSTOM PROCEDURE TRAY) ×3 IMPLANT
PAD ARMBOARD 7.5X6 YLW CONV (MISCELLANEOUS) ×6 IMPLANT
PAD CAST 4YDX4 CTTN HI CHSV (CAST SUPPLIES) ×2 IMPLANT
PADDING CAST COTTON 4X4 STRL (CAST SUPPLIES) ×6
SCREW FEMORAL CFNA 10.35X100MM (Screw) ×2 IMPLANT
SCREW LOCKING 5.0X38MM (Screw) ×2 IMPLANT
STAPLER VISISTAT 35W (STAPLE) ×3 IMPLANT
SUT MON AB 2-0 CT1 36 (SUTURE) ×3 IMPLANT
TOWEL OR 17X24 6PK STRL BLUE (TOWEL DISPOSABLE) ×3 IMPLANT
TOWEL OR 17X26 10 PK STRL BLUE (TOWEL DISPOSABLE) ×3 IMPLANT
WATER STERILE IRR 1000ML POUR (IV SOLUTION) ×3 IMPLANT

## 2017-10-23 NOTE — Transfer of Care (Signed)
Immediate Anesthesia Transfer of Care Note  Patient: [name] [name]  Procedure(s) Performed: INTRAMEDULLARY (IM) NAIL INTERTROCHANTRIC (Left )  Patient Location: PACU  Anesthesia Type:General  Level of Consciousness: awake and alert   Airway & Oxygen Therapy: Patient Spontanous Breathing and Patient connected to nasal cannula oxygen  Post-op Assessment: Report given to RN and Post -op Vital signs reviewed and stable  Post vital signs: Reviewed and stable  Last Vitals:  Vitals:   10/23/17 1415 10/23/17 1603  BP: (!) 121/47 (!) 142/115  Pulse: 82 77  Resp: 17   Temp: 37.1 C   SpO2: 100%     Last Pain:  Vitals:   10/23/17 1415  TempSrc: Oral  PainSc:       Patients Stated Pain Goal: 2 (82/88/33 [id number])  Complications: No apparent anesthesia complications

## 2017-10-23 NOTE — Progress Notes (Signed)
PROGRESS NOTE    [name] [name]  CBS:[id number] DOB: 1929-04-07 DOA: 10/20/2017  PCP: [name], MD   Brief Narrative:  Pt admitted from SNF with severe left leg pain. He was found to have \\ildly  displaced left femoral intertrochanteric fracture. Plan for surgery per ortho schedule.  Assessment & Plan:   Principal Problem:   Closed left hip fracture, initial encounter ([place]) - X ray showed mildly displaced left femoral intertrochanteric fracture - Plan for surgery today - Continue pain management efforts   Active Problems:   Pacemaker - Medtronic Adapta, dual chamber / Chronic diastolic heart failure (HCC) - Respiratory status remains stable - Continue Lasix 40 mg daily    CAD (coronary artery disease) - Continue metoprolol 25 mg daily    Dyslipidemia - Continue Zocor 40 mg at bedtime   DVT prophylaxis: Heparin subcutaneous Code Status: DNR/DNI Family Communication: Family not at the bedside this morning Disposition Plan: Plan for surgery today   Consultants:   Orthopedic surgery  Procedures:   Surgery planned for today  Antimicrobials:   Patient to received preoperative cefazolin   Subjective: No overnight events.  Objective: Vitals:   10/22/17 0600 10/22/17 1525 10/22/17 2100 10/23/17 0549  BP: 122/80 118/69 128/74 (!) 116/52  Pulse: 84 66 72 73  Resp:  18 18 16   Temp: 97.8 F (36.6 C) 98.2 F (36.8 C) 97.7 F (36.5 C) 98.5 F (36.9 C)  TempSrc: Oral Oral Axillary Axillary  SpO2: 100% 99% 95% 98%  Weight:      Height:        Intake/Output Summary (Last 24 hours) at 10/23/2017 0950 Last data filed at 10/23/2017 0552 Gross per 24 hour  Intake -  Output 1300 ml  Net -1300 ml   Filed Weights   10/21/17 1145  Weight: 66.9 kg (147 lb 7.8 oz)    Physical Exam  Constitutional: Appears well-developed and well-nourished. No distress.   CVS: Rate controlled, S1/S2 + Pulmonary: Effort and breath sounds normal, no stridor, rhonchi, wheezes,  rales.  Abdominal: Soft. BS +,  no distension, tenderness, rebound or guarding.  Musculoskeletal: pain in legs, palpable pulses  Lymphadenopathy: No lymphadenopathy noted, cervical, inguinal. Neuro: Alert. No cranial nerve deficit. Skin: Skin is warm and dry.  Psychiatric: Normal mood and affect.   Data Reviewed: I have personally reviewed following labs and imaging studies  CBC: Recent Labs  Lab 10/20/17 2322 10/22/17 0708 10/23/17 0620  WBC 6.9 5.3 5.0  NEUTROABS 5.5  --   --   HGB 9.5* 8.6* 8.4*  HCT 31.3* 28.6* 28.1*  MCV 89.9 90.8 91.2  PLT 129* 121* 846*   Basic Metabolic Panel: Recent Labs  Lab 10/18/17 0439 10/19/17 0509 10/20/17 2322 10/22/17 0708 10/23/17 0620  NA 143 144 141 143 142  K 3.7 3.9 3.7 3.8 4.1  CL 95* 94* 98* 96* 97*  CO2 42* 45* 37* 41* 40*  GLUCOSE 106* 111* 110* 109* 95  BUN 21* 22* 28* 29* 27*  CREATININE 0.85 0.78 0.77 0.73 0.80  CALCIUM 8.4* 8.8* 8.1* 8.4* 8.3*   GFR: Estimated Creatinine Clearance: 62.7 mL/min (by C-G formula based on SCr of 0.8 mg/dL). Liver Function Tests: No results for input(s): AST, ALT, ALKPHOS, BILITOT, PROT, ALBUMIN in the last 168 hours. No results for input(s): LIPASE, AMYLASE in the last 168 hours. No results for input(s): AMMONIA in the last 168 hours. Coagulation Profile: No results for input(s): INR, PROTIME in the last 168 hours. Cardiac Enzymes: Recent Labs  Lab 10/16/17 1540 10/16/17 2017  TROPONINI 0.29* 0.29*   BNP (last 3 results) Recent Labs    12/29/16 0955  PROBNP 185.0*   HbA1C: No results for input(s): HGBA1C in the last 72 hours. CBG: No results for input(s): GLUCAP in the last 168 hours. Lipid Profile: No results for input(s): CHOL, HDL, LDLCALC, TRIG, CHOLHDL, LDLDIRECT in the last 72 hours. Thyroid Function Tests: No results for input(s): TSH, T4TOTAL, FREET4, T3FREE, THYROIDAB in the last 72 hours. Anemia Panel: No results for input(s): VITAMINB12, FOLATE, FERRITIN,  TIBC, IRON, RETICCTPCT in the last 72 hours. Urine analysis:    Component Value Date/Time   COLORURINE YELLOW 06/25/2017 1322   APPEARANCEUR CLEAR 06/25/2017 1322   LABSPEC 1.009 06/25/2017 1322   PHURINE 5.0 06/25/2017 1322   GLUCOSEU NEGATIVE 06/25/2017 1322   GLUCOSEU NEGATIVE 04/03/2015 1106   HGBUR NEGATIVE 06/25/2017 1322   HGBUR large 10/21/2008 0911   BILIRUBINUR NEGATIVE 06/25/2017 1322   KETONESUR NEGATIVE 06/25/2017 1322   PROTEINUR NEGATIVE 06/25/2017 1322   UROBILINOGEN 1.0 04/03/2015 2021   NITRITE NEGATIVE 06/25/2017 1322   LEUKOCYTESUR NEGATIVE 06/25/2017 1322   Sepsis Labs: @LABRCNTIP (procalcitonin:4,lacticidven:4)   ) Recent Results (from the past 240 hour(s))  MRSA PCR Screening     Status: None   Collection Time: 10/15/17  1:29 PM  Result Value Ref Range Status   MRSA by PCR NEGATIVE NEGATIVE Final    Comment:        The GeneXpert MRSA Assay (FDA approved for NASAL specimens only), is one component of a comprehensive MRSA colonization surveillance program. It is not intended to diagnose MRSA infection nor to guide or monitor treatment for MRSA infections.   Surgical pcr screen     Status: None   Collection Time: 10/22/17  9:49 AM  Result Value Ref Range Status   MRSA, PCR NEGATIVE NEGATIVE Final   Staphylococcus aureus NEGATIVE NEGATIVE Final    Comment: (NOTE) The Xpert SA Assay (FDA approved for NASAL specimens in patients 42 years of age and older), is one component of a comprehensive surveillance program. It is not intended to diagnose infection nor to guide or monitor treatment.       Radiology Studies: Dg Chest 2 View  Result Date: 10/21/2017 CLINICAL DATA:  Status post fall, with concern for chest injury. Initial encounter. EXAM: CHEST  2 VIEW COMPARISON:  Chest radiograph performed 10/18/2017 FINDINGS: A moderate right-sided pleural effusion is noted. Vascular congestion is seen, with bilateral central airspace opacities. This may  reflect pulmonary edema or possibly pneumonia. No pneumothorax is seen. The cardiomediastinal silhouette is mildly enlarged. A pacemaker is noted at the left chest wall, with leads ending at the right atrium and right ventricle. IMPRESSION: 1. Moderate right-sided pleural effusion. Vascular congestion and mild cardiomegaly, with bilateral central airspace opacities. This may reflect pulmonary edema or possibly pneumonia. 2. No displaced rib fracture seen. Electronically Signed   By: Garald Balding M.D.   On: 10/21/2017 01:33   Dg Hip Unilat With Pelvis 2-3 Views Left  Result Date: 10/21/2017 CLINICAL DATA:  Status post fall, with left hip pain. Initial encounter. EXAM: DG HIP (WITH OR WITHOUT PELVIS) 2-3V LEFT COMPARISON:  None. FINDINGS: There is a mildly displaced left femoral intertrochanteric fracture. The femoral heads remain seated within their respective acetabula. No additional fractures are seen. Diffuse calcification is seen along the abdominal aorta and its branches. The visualized bowel gas pattern is grossly unremarkable. The sacroiliac joints are not well characterized, but appear grossly  unremarkable. The sacrum is difficult to assess. IMPRESSION: 1. Mildly displaced left femoral intertrochanteric fracture. 2. Diffuse aortic atherosclerosis. Electronically Signed   By: Garald Balding M.D.   On: 10/21/2017 01:38        Scheduled Meds: . chlorhexidine  60 mL Topical Once  . feeding supplement (ENSURE ENLIVE)  237 mL Oral TID BM  . furosemide  40 mg Oral Daily  . heparin  5,000 Units Subcutaneous Q8H  . metoprolol succinate  25 mg Oral Daily  . multivitamin with minerals  1 tablet Oral Daily  . pantoprazole  40 mg Oral Daily  . potassium chloride SA  20 mEq Oral Daily  . povidone-iodine  2 application Topical Once  . povidone-iodine  2 application Topical Once  . simvastatin  40 mg Oral QHS  . tamsulosin  0.4 mg Oral QHS   Continuous Infusions: .  ceFAZolin (ANCEF) IV        LOS: 2 days    Time spent: 15 minutes  Greater than 50% of the time spent on counseling and coordinating the care.   Leisa Lenz, MD [facility] Pager [phone]  If 7PM-7AM, please contact night-coverage [email or web address] Password [email or web address] 10/23/2017, 9:50 AM

## 2017-10-23 NOTE — Op Note (Signed)
Date of Surgery: 10/23/2017  INDICATIONS: [name] [name] is a 83 y.o.-year-old male who sustained a left hip fracture. The risks and benefits of the procedure discussed with the patient prior to the procedure and all questions were answered; consent was obtained.  PREOPERATIVE DIAGNOSIS: left hip fracture   POSTOPERATIVE DIAGNOSIS: Same   PROCEDURE: Treatment of intertrochanteric, pertrochanteric, subtrochanteric fracture with intramedullary implant. CPT [phone]   SURGEON: Dannielle Karvonen. Stann Mainland, M.D.   ANESTHESIA: general   IV FLUIDS AND URINE: See anesthesia record   ESTIMATED BLOOD LOSS: 150 cc  IMPLANTS:  Synthes TFN A 11 mm x 235 mm 105 mm proximal compression Distal interlock 38 mm  DRAINS: None.   COMPLICATIONS: None.   DESCRIPTION OF PROCEDURE: The patient was brought to the operating room and placed supine on the operating table. The patient's leg had been signed prior to the procedure. The patient had the anesthesia placed by the anesthesiologist. The prep verification and incision time-outs were performed to confirm that this was the correct patient, site, side and location. The patient had an SCD on the opposite lower extremity. The patient did receive antibiotics prior to the incision and was re-dosed during the procedure as needed at indicated intervals. The patient was positioned on the fracture table with the table in traction and internal rotation to reduce the hip. The well leg was placed in a scissor position and all bony prominences were well-padded. The patient had the lower extremity prepped and draped in the standard surgical fashion. The incision was made 4 finger breadths superior to the greater trochanter. A guide pin was inserted into the tip of the greater trochanter under fluoroscopic guidance. An opening reamer was used to gain access to the femoral canal. The nail length was measured and inserted down the femoral canal to its proper depth. The appropriate version of  insertion for the lag screw was found under fluoroscopy. A pin was inserted up the femoral neck through the jig. The length of the lag screw was then measured. The lag screw was inserted as near to center-center in the head as possible. The leg was taken out of traction, then the compression screw was used to compress across the fracture. Compression was visualized on serial xrays.   We next turned our attention to the distal interlocking screw.  This was placed through the drill guide of the nail inserter.  A small incision was made overlying the lateral thigh at the screw site, and a tonsil was used to disect down to bone.  A drill pass was made through the jig and across the nail through both cortices.  This was measured, and the appropriate screw was placed under hand power and found to have good bit.    The wound was copiously irrigated with saline and the subcutaneous layer closed with 2.0 monocryl and the skin was reapproximated with staples. The wounds were cleaned and dried a final time and a sterile dressing was placed. The hip was taken through a range of motion at the end of the case under fluoroscopic imaging to visualize the approach-withdraw phenomenon and confirm implant length in the head. The patient was then awakened from anesthesia and taken to the recovery room in stable condition. All counts were correct at the end of the case.   POSTOPERATIVE PLAN: The patient will be weight bearing as tolerated and will return in 2 weeks for staple removal and the patient will receive DVT prophylaxis based on other medications, activity level, and  risk ratio of bleeding to thrombosis.      [name], [name] [phone] 8:34 PM

## 2017-10-23 NOTE — H&P (Signed)
H&P update  The surgical history has been reviewed and remains accurate without interval change.  The patient was re-examined and patient's physiologic condition has not changed significantly in the last 30 days. The condition still exists that makes this procedure necessary. The treatment plan remains the same, without new options for care.  No new pharmacological allergies or types of therapy has been initiated that would change the plan or the appropriateness of the plan.  The patient and/or family understand the potential benefits and risks.  Keisa Blow P. Stann Mainland, MD 10/23/2017 5:39 PM

## 2017-10-23 NOTE — Anesthesia Preprocedure Evaluation (Signed)
Anesthesia Evaluation  Patient identified by MRN, date of birth, ID band Patient awake    Reviewed: Allergy & Precautions, NPO status , Patient's Chart, lab work & pertinent test results  Airway Mallampati: II  TM Distance: >3 FB Neck ROM: Limited    Dental no notable dental hx.    Pulmonary neg pulmonary ROS, former smoker,    Pulmonary exam normal breath sounds clear to auscultation       Cardiovascular hypertension, + CAD, + Past MI, + Cardiac Stents and + CABG  Normal cardiovascular exam+ dysrhythmias Atrial Fibrillation + pacemaker  Rhythm:Regular Rate:Normal  Left ventricle: The cavity size was normal. There was mild   concentric hypertrophy. Systolic function was normal. The   estimated ejection fraction was in the range of 50% to 55%. Mild   hypokinesis of the anteroseptal and inferoseptal myocardium. The   study is not technically sufficient to allow evaluation of LV   diastolic function. - Aortic valve: A bioprosthesis was present. Transvalvular velocity   was within the normal range. There was no stenosis. There was   mild regurgitation. Peak velocity (S): 200 cm/s. - Mitral valve: A bioprosthesis was present. Pressure half-time:   130 ms. Mean gradient (D): 8 mm Hg. - Left atrium: The atrium was severely dilated. - Right ventricle: The cavity size was normal. Wall thickness was   normal. Systolic function was normal. - Tricuspid valve: There was moderate regurgitation. - Pulmonic valve: There was moderate regurgitation. - Pulmonary arteries: Systolic pressure was severely increased. PA   peak pressure: 68 mm Hg (S).  Impressions:  - Mean gradient across the bioprosthetic mitral valve has increased   from 4 mmHg to 8 mmHg since 06/2017.  ------------------------------------------------------------------- Labs, prior tests, procedures, and surgery: Permanent pacemaker system implantation.     Neuro/Psych negative neurological ROS  negative psych ROS   GI/Hepatic Neg liver ROS, GERD  ,  Endo/Other  Hyperthyroidism   Renal/GU negative Renal ROS  negative genitourinary   Musculoskeletal negative musculoskeletal ROS (+)   Abdominal   Peds negative pediatric ROS (+)  Hematology  (+) anemia ,   Anesthesia Other Findings   Reproductive/Obstetrics negative OB ROS                             Anesthesia Physical Anesthesia Plan  ASA: IV  Anesthesia Plan: General   Post-op Pain Management:    Induction: Intravenous  PONV Risk Score and Plan: 2 and Ondansetron, Dexamethasone and Treatment may vary due to age or medical condition  Airway Management Planned: Oral ETT  Additional Equipment:   Intra-op Plan:   Post-operative Plan: Possible Post-op intubation/ventilation  Informed Consent: I have reviewed the patients History and Physical, chart, labs and discussed the procedure including the risks, benefits and alternatives for the proposed anesthesia with the patient or authorized representative who has indicated his/her understanding and acceptance.   Dental advisory given  Plan Discussed with: CRNA and Surgeon  Anesthesia Plan Comments:         Anesthesia Quick Evaluation

## 2017-10-23 NOTE — Anesthesia Postprocedure Evaluation (Signed)
Anesthesia Post Note  Patient: HUSSEIN MACDOUGAL  Procedure(s) Performed: INTRAMEDULLARY (IM) NAIL INTERTROCHANTRIC (Left )     Patient location during evaluation: PACU Anesthesia Type: General Level of consciousness: sedated Pain management: pain level controlled Vital Signs Assessment: post-procedure vital signs reviewed and stable Respiratory status: spontaneous breathing and respiratory function stable Cardiovascular status: stable Postop Assessment: no apparent nausea or vomiting Anesthetic complications: no    Last Vitals:  Vitals:   10/23/17 2049 10/23/17 2105  BP: 119/74 117/73  Pulse: 88 88  Resp: (!) 23 20  Temp:  36.5 C  SpO2: 96% 92%    Last Pain:  Vitals:   10/23/17 1415  TempSrc: Oral  PainSc:                  Lazar Tierce DANIEL

## 2017-10-23 NOTE — Telephone Encounter (Signed)
Spoke with pt wife, she reports the patient fell at [facility] and cracked his hip. He is back in the hosp and needing surgery. She will call Wednesday if patient will not be able to make appointment Thursday this week.

## 2017-10-24 ENCOUNTER — Encounter (HOSPITAL_COMMUNITY): Payer: Self-pay | Admitting: Orthopedic Surgery

## 2017-10-24 LAB — CBC
HEMATOCRIT: 29.1 % — AB (ref 39.0–52.0)
HEMOGLOBIN: 8.7 g/dL — AB (ref 13.0–17.0)
MCH: 27.4 pg (ref 26.0–34.0)
MCHC: 29.9 g/dL — AB (ref 30.0–36.0)
MCV: 91.8 fL (ref 78.0–100.0)
Platelets: 149 10*3/uL — ABNORMAL LOW (ref 150–400)
RBC: 3.17 MIL/uL — ABNORMAL LOW (ref 4.22–5.81)
RDW: 16.2 % — ABNORMAL HIGH (ref 11.5–15.5)
WBC: 6.9 10*3/uL (ref 4.0–10.5)

## 2017-10-24 LAB — BASIC METABOLIC PANEL
ANION GAP: 8 (ref 5–15)
BUN: 31 mg/dL — ABNORMAL HIGH (ref 6–20)
CHLORIDE: 96 mmol/L — AB (ref 101–111)
CO2: 36 mmol/L — AB (ref 22–32)
Calcium: 8.2 mg/dL — ABNORMAL LOW (ref 8.9–10.3)
Creatinine, Ser: 0.89 mg/dL (ref 0.61–1.24)
GFR calc non Af Amer: >60 mL/min (ref >60)
Glucose, Bld: 105 mg/dL — ABNORMAL HIGH (ref 65–99)
Potassium: 4.3 mmol/L (ref 3.5–5.1)
Sodium: 140 mmol/L (ref 135–145)

## 2017-10-24 NOTE — Progress Notes (Signed)
PROGRESS NOTE    [name] [name]  OIZ:[id number] DOB: 09-28-1929 DOA: 10/20/2017 PCP: [name], MD    Brief Narrative: Pt admitted from SNF with severe left leg pain. He was found to have  displaced left femoral intertrochanteric fracture. Plan for surgery per ortho schedule. Underwent sx 12-17    Assessment & Plan:   Principal Problem:   Closed left hip fracture, initial encounter [facility]) Active Problems:   Pacemaker - Medtronic Adapta, dual chamber   Chronic diastolic heart failure (HCC)   CAD (coronary artery disease)   Closed left hip fracture;  X ray showed mildly displaced left femoral intertrochanteric fracture. Underwent sx 12-17, treatment of intertrochanteric peri and sub trochanteric fracture with intramedullary implant.  PT per Ortho, recommending SNF.  DVT prophylaxis per ortho.   Pacemaker - Medtronic Adapta, dual chamber / Chronic diastolic heart failure Continue with lasix.   CAD; continue with metoprolol.   Dyslipidemia; continue with Zocor.      DVT prophylaxis: Heparin  Code Status: DNR Family Communication: care discussed with Wife.  Disposition Plan: needs SNF, hopefully in 24 hours.    Consultants:   Ortho   Procedures:  Underwent sx 12-17, treatment of intertrochanteric peri and sub trochanteric fracture with intramedullary implant.    Antimicrobials:  Patient to received preoperative cefazolin       Subjective: He is feeling better, denies dyspnea. He had BM.   Objective: Vitals:   10/23/17 2105 10/23/17 2142 10/23/17 2300 10/24/17 0700  BP: 117/73 135/77 116/69 110/64  Pulse: 88 88 87 82  Resp: 20 18 20 18   Temp: 97.7 F (36.5 C)  98.8 F (37.1 C) 97.7 F (36.5 C)  TempSrc:   Oral Oral  SpO2: 92% 95% 95% 98%  Weight:      Height:        Intake/Output Summary (Last 24 hours) at 10/24/2017 1245 Last data filed at 10/24/2017 1000 Gross per 24 hour  Intake 1322 ml  Output 1200 ml  Net 122 ml   Filed Weights     10/21/17 1145  Weight: 66.9 kg (147 lb 7.8 oz)    Examination:  General exam: Appears calm and comfortable  Respiratory system: Clear to auscultation. Respiratory effort normal. Cardiovascular system: S1 & S2 heard, RRR. No JVD, murmurs, rubs, gallops or clicks. No pedal edema. Gastrointestinal system: Abdomen is nondistended, soft and nontender. No organomegaly or masses felt. Normal bowel sounds heard. Central nervous system: Alert and oriented. No focal neurological deficits. Extremities: Symmetric 5 x 5 power. Skin: No rashes, lesions or ulcers    Data Reviewed: I have personally reviewed following labs and imaging studies  CBC: Recent Labs  Lab 10/20/17 2322 10/22/17 0708 10/23/17 0620 10/24/17 0650  WBC 6.9 5.3 5.0 6.9  NEUTROABS 5.5  --   --   --   HGB 9.5* 8.6* 8.4* 8.7*  HCT 31.3* 28.6* 28.1* 29.1*  MCV 89.9 90.8 91.2 91.8  PLT 129* 121* 135* 338*   Basic Metabolic Panel: Recent Labs  Lab 10/19/17 0509 10/20/17 2322 10/22/17 0708 10/23/17 0620 10/24/17 0650  NA 144 141 143 142 140  K 3.9 3.7 3.8 4.1 4.3  CL 94* 98* 96* 97* 96*  CO2 45* 37* 41* 40* 36*  GLUCOSE 111* 110* 109* 95 105*  BUN 22* 28* 29* 27* 31*  CREATININE 0.78 0.77 0.73 0.80 0.89  CALCIUM 8.8* 8.1* 8.4* 8.3* 8.2*   GFR: Estimated Creatinine Clearance: 56.4 mL/min (by C-G formula based on SCr of 0.89  mg/dL). Liver Function Tests: No results for input(s): AST, ALT, ALKPHOS, BILITOT, PROT, ALBUMIN in the last 168 hours. No results for input(s): LIPASE, AMYLASE in the last 168 hours. No results for input(s): AMMONIA in the last 168 hours. Coagulation Profile: No results for input(s): INR, PROTIME in the last 168 hours. Cardiac Enzymes: No results for input(s): CKTOTAL, CKMB, CKMBINDEX, TROPONINI in the last 168 hours. BNP (last 3 results) Recent Labs    12/29/16 0955  PROBNP 185.0*   HbA1C: No results for input(s): HGBA1C in the last 72 hours. CBG: No results for input(s):  GLUCAP in the last 168 hours. Lipid Profile: No results for input(s): CHOL, HDL, LDLCALC, TRIG, CHOLHDL, LDLDIRECT in the last 72 hours. Thyroid Function Tests: No results for input(s): TSH, T4TOTAL, FREET4, T3FREE, THYROIDAB in the last 72 hours. Anemia Panel: No results for input(s): VITAMINB12, FOLATE, FERRITIN, TIBC, IRON, RETICCTPCT in the last 72 hours. Sepsis Labs: No results for input(s): PROCALCITON, LATICACIDVEN in the last 168 hours.  Recent Results (from the past 240 hour(s))  MRSA PCR Screening     Status: None   Collection Time: 10/15/17  1:29 PM  Result Value Ref Range Status   MRSA by PCR NEGATIVE NEGATIVE Final    Comment:        The GeneXpert MRSA Assay (FDA approved for NASAL specimens only), is one component of a comprehensive MRSA colonization surveillance program. It is not intended to diagnose MRSA infection nor to guide or monitor treatment for MRSA infections.   Surgical pcr screen     Status: None   Collection Time: 10/22/17  9:49 AM  Result Value Ref Range Status   MRSA, PCR NEGATIVE NEGATIVE Final   Staphylococcus aureus NEGATIVE NEGATIVE Final    Comment: (NOTE) The Xpert SA Assay (FDA approved for NASAL specimens in patients 66 years of age and older), is one component of a comprehensive surveillance program. It is not intended to diagnose infection nor to guide or monitor treatment.          Radiology Studies: Dg C-arm 1-60 Min  Result Date: 10/23/2017 CLINICAL DATA:  Fracture EXAM: LEFT FEMUR 2 VIEWS; DG C-ARM 61-120 MIN COMPARISON:  10/20/2017 FINDINGS: Four low resolution intraoperative spot views of the left femur. Total fluoroscopy time was 41 seconds. The images demonstrate intramedullary rod and screw fixation of the left femur for intertrochanteric fracture. IMPRESSION: Intraoperative fluoroscopic assistance provided during surgical fixation of proximal left femur fracture Electronically Signed   By: Donavan Foil M.D.   On:  10/23/2017 22:23   Dg Femur Min 2 Views Left  Result Date: 10/23/2017 CLINICAL DATA:  Fracture EXAM: LEFT FEMUR 2 VIEWS; DG C-ARM 61-120 MIN COMPARISON:  10/20/2017 FINDINGS: Four low resolution intraoperative spot views of the left femur. Total fluoroscopy time was 41 seconds. The images demonstrate intramedullary rod and screw fixation of the left femur for intertrochanteric fracture. IMPRESSION: Intraoperative fluoroscopic assistance provided during surgical fixation of proximal left femur fracture Electronically Signed   By: Donavan Foil M.D.   On: 10/23/2017 22:23        Scheduled Meds: . aspirin  325 mg Oral Daily  . feeding supplement (ENSURE ENLIVE)  237 mL Oral TID BM  . furosemide  40 mg Oral Daily  . heparin  5,000 Units Subcutaneous Q8H  . metoprolol succinate  25 mg Oral Daily  . multivitamin with minerals  1 tablet Oral Daily  . pantoprazole  40 mg Oral Daily  . simvastatin  40 mg  Oral QHS  . tamsulosin  0.4 mg Oral QHS   Continuous Infusions: .  ceFAZolin (ANCEF) IV Stopped (10/24/17 0705)     LOS: 3 days    Time spent: 35 minutes.     Elmarie Shiley, MD [facility] Pager [phone]  If 7PM-7AM, please contact night-coverage [email or web address] Password [email or web address] 10/24/2017, 12:45 PM

## 2017-10-24 NOTE — Progress Notes (Addendum)
   Subjective:  Patient reports pain as mild.  No complaints at this time.  He has been up to the side of the bed with therapy.  Pain well controlled.  He denies shortness of breath, chest pain, nausea or vomiting.  Objective:   VITALS:   Vitals:   10/23/17 2300 10/24/17 0700 10/24/17 1510 10/24/17 2017  BP: 116/69 110/64 (!) 142/69 126/72  Pulse: 87 82 75 90  Resp: 20 18 16 18   Temp: 98.8 F (37.1 C) 97.7 F (36.5 C) 97.9 F (36.6 C) 98 F (36.7 C)  TempSrc: Oral Oral Oral Oral  SpO2: 95% 98% 93% 92%  Weight:      Height:       Left lower extremity:  Neurovascular intact Sensation intact distally Dorsiflexion/Plantar flexion intact Incision: dressing C/D/I chronic venous stasis changes noted throughout bilateral lower knees   Lab Results  Component Value Date   WBC 6.9 10/24/2017   HGB 8.7 (L) 10/24/2017   HCT 29.1 (L) 10/24/2017   MCV 91.8 10/24/2017   PLT 149 (L) 10/24/2017   BMET    Component Value Date/Time   NA 140 10/24/2017 0650   NA 151 (H) 08/28/2017 1016   K 4.3 10/24/2017 0650   CL 96 (L) 10/24/2017 0650   CO2 36 (H) 10/24/2017 0650   GLUCOSE 105 (H) 10/24/2017 0650   BUN 31 (H) 10/24/2017 0650   BUN 23 08/28/2017 1016   CREATININE 0.89 10/24/2017 0650   CREATININE 1.14 (H) 02/22/2017 1314   CALCIUM 8.2 (L) 10/24/2017 0650   GFRNONAA >60 10/24/2017 0650   GFRNONAA 66 06/06/2014 1104   GFRAA >60 10/24/2017 0650   GFRAA 76 06/06/2014 1104     Assessment/Plan: 1 Day Post-Op   Principal Problem:   Closed left hip fracture, initial encounter (HCC) Active Problems:   Pacemaker - Medtronic Adapta, dual chamber   Chronic diastolic heart failure (HCC)   CAD (coronary artery disease)   Advance diet Up with therapy Discharge to SNF Weightbearing as tolerated to left lower extremity Aspirin Daily and SCDs for DVT prophylaxis. Maintain postoperative bandage until follow-up appointment.   Nicholes Stairs 10/24/2017, 9:21  PM   [name], MD [phone]

## 2017-10-24 NOTE — Evaluation (Signed)
Occupational Therapy Evaluation Patient Details Name: [name] [name] MRN: [id number] DOB: [birth year] Today's Date: 10/24/2017    History of Present Illness Pt is an 83 y.o. male who recently discharged to SNF for rehab s/p mechanical fall resulting in L femoral intertrochanteric fracture. Now s/p intertrochanteric IM nail 10/23/17. PMH includes CHF, CAD, AVS s/p TAVR, sick sinus syndrome s/p pacemaker placement, and atrial fibrillation.   Clinical Impression   Pt recently discharged to SNF for rehabilitation but has not begun working with therapy prior to falling. Prior to previous admission, he was independent with ADL and functional mobility. Pt currently limited by L LE pain and decreased activity tolerance for ADL this session. Pt very motivated to participate and return to independence. He requires total assistance for LB ADL mod assist +2 for simulated toilet transfers, and set-up for seated UB ADL. Pt would benefit from continued OT services while admitted to improve independence and safety with ADL and functional mobility. Recommend SNF level rehabilitation once medically ready for discharge. Pt is in agreement. Will continue to follow while admitted.     Follow Up Recommendations  SNF;Supervision/Assistance - 24 hour    Equipment Recommendations  Other (comment)(TBD at next venue of care)    Recommendations for Other Services       Precautions / Restrictions Precautions Precautions: Fall Precaution Comments: L knee buckling Restrictions Weight Bearing Restrictions: Yes LLE Weight Bearing: Weight bearing as tolerated      Mobility Bed Mobility Overal bed mobility: Needs Assistance Bed Mobility: Supine to Sit     Supine to sit: Mod assist     General bed mobility comments: Mod assist for management of L LE and scooting hips to EOB  Transfers Overall transfer level: Needs assistance Equipment used: Rolling walker (2 wheeled) Transfers: Sit to/from Stand Sit to  Stand: Mod assist;+2 safety/equipment         General transfer comment: Mod assist to power up. Second person for safety.     Balance Overall balance assessment: Needs assistance Sitting-balance support: No upper extremity supported;Feet supported Sitting balance-Leahy Scale: Fair     Standing balance support: No upper extremity supported;Bilateral upper extremity supported Standing balance-Leahy Scale: Poor Standing balance comment: Relies on external assistance and use of RW.                            ADL either performed or assessed with clinical judgement   ADL Overall ADL's : Needs assistance/impaired Eating/Feeding: Set up;Sitting   Grooming: Set up;Sitting   Upper Body Bathing: Set up;Sitting   Lower Body Bathing: Sit to/from stand;Total assistance   Upper Body Dressing : Set up;Sitting   Lower Body Dressing: Sit to/from stand;Total assistance   Toilet Transfer: Moderate assistance;+2 for safety/equipment;RW;Ambulation Toilet Transfer Details (indicate cue type and reason): L knee buckling and assistance to progress RW Toileting- Clothing Manipulation and Hygiene: Maximal assistance;Sit to/from stand       Functional mobility during ADLs: Moderate assistance;+2 for safety/equipment;Rolling walker General ADL Comments: Pt with posterior lean and decreased functional use of RW.      Vision Patient Visual Report: No change from baseline Vision Assessment?: No apparent visual deficits     Perception     Praxis      Pertinent Vitals/Pain Pain Assessment: 0-10 Pain Score: 7  Pain Descriptors / Indicators: Guarding;Aching;Sore Pain Intervention(s): Limited activity within patient's tolerance;Monitored during session;Repositioned     Hand Dominance     Extremity/Trunk Assessment  Upper Extremity Assessment Upper Extremity Assessment: Generalized weakness   Lower Extremity Assessment Lower Extremity Assessment: LLE deficits/detail LLE  Deficits / Details: Decreased strength and ROM post-operatively. Knee buckling during ambulation.        Communication Communication Communication: HOH   Cognition Arousal/Alertness: Awake/alert Behavior During Therapy: WFL for tasks assessed/performed Overall Cognitive Status: Impaired/Different from baseline Area of Impairment: Memory;Safety/judgement                     Memory: Decreased short-term memory   Safety/Judgement: Decreased awareness of deficits     General Comments: Decreased memory of events when talking with nurse tech noted after session.    General Comments  Pt very motivated to return to independence.     Exercises     Shoulder Instructions      Home Living Family/patient expects to be discharged to:: Skilled nursing facility                                        Prior Functioning/Environment Level of Independence: Needs assistance  Gait / Transfers Assistance Needed: Not using assistive devices at baseline ADL's / Homemaking Assistance Needed: Pt was at SNF for 1 day prior to admission after fall. Was independent prior to previous admission.             OT Problem List: Decreased strength;Decreased activity tolerance;Impaired balance (sitting and/or standing);Decreased safety awareness;Decreased knowledge of use of DME or AE;Decreased knowledge of precautions;Pain      OT Treatment/Interventions: Self-care/ADL training;Therapeutic exercise;Energy conservation;DME and/or AE instruction;Therapeutic activities;Balance training;Patient/family education    OT Goals(Current goals can be found in the care plan section) Acute Rehab OT Goals Patient Stated Goal: get back to walking OT Goal Formulation: With patient Time For Goal Achievement: 11/07/17 Potential to Achieve Goals: Good  OT Frequency: Min 2X/week   Barriers to D/C:            Co-evaluation PT/OT/SLP Co-Evaluation/Treatment: Yes Reason for Co-Treatment: For  patient/therapist safety   OT goals addressed during session: ADL's and self-care      AM-PAC PT "6 Clicks" Daily Activity     Outcome Measure Help from another person eating meals?: A Little Help from another person taking care of personal grooming?: A Little Help from another person toileting, which includes using toliet, bedpan, or urinal?: A Lot Help from another person bathing (including washing, rinsing, drying)?: A Lot Help from another person to put on and taking off regular upper body clothing?: A Little Help from another person to put on and taking off regular lower body clothing?: A Lot 6 Click Score: 15   End of Session Equipment Utilized During Treatment: Gait belt;Oxygen;Rolling walker Nurse Communication: Mobility status  Activity Tolerance: Patient tolerated treatment well Patient left: in chair;with call bell/phone within reach  OT Visit Diagnosis: Unsteadiness on feet (R26.81);Pain;Muscle weakness (generalized) (M62.81) Pain - Right/Left: Left Pain - part of body: Leg                Time: 2836-6294 OT Time Calculation (min): 26 min Charges:  OT General Charges $OT Visit: 1 Visit OT Evaluation $OT Eval Moderate Complexity: 1 Mod G-Codes:     Norman Herrlich, MS OTR/L  Pager: [place] A Chistopher Mangino 10/24/2017, 10:27 AM

## 2017-10-24 NOTE — Social Work (Addendum)
CSW faxed clinicals to [facility] to initiate Insurance Auth for SNF placement.  4:08pm CSW received call from [facility] approving SNF placement. Insurance [phone], RUG Level:RVB 550 min weekly of therapy.  CSW called Candace in admissions at [facility] and left message.  CSW will continue to follow for disposition.  Elissa Hefty, LCSW Clinical Social Worker [phone]

## 2017-10-24 NOTE — Evaluation (Signed)
Physical Therapy Evaluation Patient Details Name: [name] [name] MRN: [id number] DOB: 04/13/1929 Today's Date: 10/24/2017   History of Present Illness  Pt is an 83 y.o. male who recently discharged to SNF for rehab s/p mechanical fall resulting in L femoral intertrochanteric fracture. Now s/p intertrochanteric IM nail 10/23/17. PMH includes CHF, CAD, AVS s/p TAVR, sick sinus syndrome s/p pacemaker placement, and atrial fibrillation.  Clinical Impression  Patient is s/p above surgery resulting in functional limitations due to the deficits listed below (see PT Problem List). Pt with L knee buckling in standing, required mod A +2 to ambulate 5' with RW. Plane to return to [facility] for rehab.   Patient will benefit from skilled PT to increase their independence and safety with mobility to allow discharge to the venue listed below.       Follow Up Recommendations SNF;Supervision/Assistance - 24 hour    Equipment Recommendations  None recommended by PT    Recommendations for Other Services       Precautions / Restrictions Precautions Precautions: Fall Precaution Comments: L knee buckling Restrictions Weight Bearing Restrictions: Yes LLE Weight Bearing: Weight bearing as tolerated      Mobility  Bed Mobility Overal bed mobility: Needs Assistance Bed Mobility: Supine to Sit     Supine to sit: Mod assist     General bed mobility comments: Mod assist for management of L LE and scooting hips to EOB  Transfers Overall transfer level: Needs assistance Equipment used: Rolling walker (2 wheeled) Transfers: Sit to/from Stand Sit to Stand: Mod assist;+2 safety/equipment         General transfer comment: Mod assist to power up. Second person for safety.   Ambulation/Gait Ambulation/Gait assistance: Mod assist;+2 safety/equipment Ambulation Distance (Feet): 5 Feet Assistive device: Rolling walker (2 wheeled) Gait Pattern/deviations: Step-to pattern;Decreased weight shift to  left;Leaning posteriorly Gait velocity: decreased Gait velocity interpretation: <1.8 ft/sec, indicative of risk for recurrent falls General Gait Details: pt unfamiliar with use of RW and with L knee buckling which made ambulation quite difficult. Pt leaning posterior, mod A to prevent post LOB, cues for pressing down through RW with UE's  Stairs            Wheelchair Mobility    Modified Rankin (Stroke Patients Only)       Balance Overall balance assessment: Needs assistance Sitting-balance support: No upper extremity supported;Feet supported Sitting balance-Leahy Scale: Fair     Standing balance support: Bilateral upper extremity supported Standing balance-Leahy Scale: Poor Standing balance comment: heavy reliance on UE support                             Pertinent Vitals/Pain Pain Assessment: 0-10 Pain Score: 7  Pain Location: L hip Pain Descriptors / Indicators: Guarding;Aching;Sore Pain Intervention(s): Limited activity within patient's tolerance;Monitored during session    [place] expects to be discharged to:: Skilled nursing facility                 Additional Comments: had gone to the rehab section of [facility] for only a day before falling and returning to [facility]    Prior Function Level of Independence: Needs assistance   Gait / Transfers Assistance Needed: Not using assistive devices at baseline  ADL's / Homemaking Assistance Needed: Pt was at SNF for 1 day prior to admission after fall. Was independent prior to previous admission.   Comments: pt lives at [facility] with wife,  goes for some meals to main building, drives     Hand Dominance   Dominant Hand: Right    Extremity/Trunk Assessment   Upper Extremity Assessment Upper Extremity Assessment: Defer to OT evaluation    Lower Extremity Assessment Lower Extremity Assessment: LLE deficits/detail LLE Deficits / Details: hip flex 2-/5, knee ext 2/5,  knee flex 2+/5, L knee buckling when standing. Poor circulation noted knee down BLE's and pt reported decreased sensation L LE in standing LLE Sensation: decreased proprioception LLE Coordination: decreased fine motor;decreased gross motor    Cervical / Trunk Assessment Cervical / Trunk Assessment: Kyphotic  Communication   Communication: HOH  Cognition Arousal/Alertness: Awake/alert Behavior During Therapy: WFL for tasks assessed/performed Overall Cognitive Status: Impaired/Different from baseline Area of Impairment: Memory;Safety/judgement                     Memory: Decreased short-term memory   Safety/Judgement: Decreased awareness of deficits     General Comments: Decreased memory of events when talking with nurse tech noted after session.       General Comments General comments (skin integrity, edema, etc.): VSS, O2 sats 95% on 3L O2, HR 85 bpm after ambulation    Exercises General Exercises - Lower Extremity Ankle Circles/Pumps: AROM;Both;10 reps;Supine Quad Sets: AROM;Both;10 reps;Supine Long Arc Quad: AROM;Left;5 reps;Seated Heel Slides: AROM;Left;5 reps;Supine Straight Leg Raises: AAROM;Left;5 reps;Supine   Assessment/Plan    PT Assessment Patient needs continued PT services  PT Problem List Decreased strength;Decreased range of motion;Decreased activity tolerance;Decreased balance;Decreased mobility;Decreased knowledge of use of DME;Decreased safety awareness;Decreased knowledge of precautions;Pain;Impaired sensation       PT Treatment Interventions DME instruction;Gait training;Functional mobility training;Therapeutic activities;Therapeutic exercise;Balance training;Neuromuscular re-education;Patient/family education    PT Goals (Current goals can be found in the Care Plan section)  Acute Rehab PT Goals Patient Stated Goal: get back to walking PT Goal Formulation: With patient Time For Goal Achievement: 11/07/17 Potential to Achieve Goals: Good     Frequency Min 3X/week   Barriers to discharge        Co-evaluation PT/OT/SLP Co-Evaluation/Treatment: Yes Reason for Co-Treatment: For patient/therapist safety PT goals addressed during session: Mobility/safety with mobility;Balance;Proper use of DME OT goals addressed during session: ADL's and self-care       AM-PAC PT "6 Clicks" Daily Activity  Outcome Measure Difficulty turning over in bed (including adjusting bedclothes, sheets and blankets)?: Unable Difficulty moving from lying on back to sitting on the side of the bed? : Unable Difficulty sitting down on and standing up from a chair with arms (e.g., wheelchair, bedside commode, etc,.)?: Unable Help needed moving to and from a bed to chair (including a wheelchair)?: A Lot Help needed walking in hospital room?: A Lot Help needed climbing 3-5 steps with a railing? : Total 6 Click Score: 8    End of Session Equipment Utilized During Treatment: Gait belt;Oxygen Activity Tolerance: Patient limited by pain;Patient limited by fatigue Patient left: in chair;with call bell/phone within reach Nurse Communication: Mobility status PT Visit Diagnosis: Unsteadiness on feet (R26.81);Other abnormalities of gait and mobility (R26.89);Pain;Muscle weakness (generalized) (M62.81);History of falling (Z91.81) Pain - Right/Left: Left Pain - part of body: Hip    Time: 3086-5784 PT Time Calculation (min) (ACUTE ONLY): 26 min   Charges:   PT Evaluation $PT Eval Moderate Complexity: 1 Mod     PT G Codes:        Leighton Roach, PT  Acute Rehab Services  [place] 10/24/2017, 12:09 PM

## 2017-10-24 NOTE — Clinical Social Work Note (Signed)
Clinical Social Work Assessment  Patient Details  Name: [name] [name] MRN: [id number] Date of Birth: September 23, 1929  Date of referral:  10/24/17               Reason for consult:  Facility Placement                Permission sought to share information with:  Chartered certified accountant granted to share information::  Yes, Verbal Permission Granted  Name::     Gerhard Perches  Agency::  [facility]  Relationship::     Contact Information:     Housing/Transportation Living arrangements for the past 2 months:  Charity fundraiser of Information:  Patient, Spouse Patient Interpreter Needed:  None Criminal Activity/Legal Involvement Pertinent to Current Situation/Hospitalization:  No - Comment as needed Significant Relationships:  Spouse, Adult Children Lives with:  Spouse Do you feel safe going back to the place where you live?  Yes Need for family participation in patient care:  Yes (Comment)  Care giving concerns:  Pt and spouse resides at [facility] and can receive short term rehab at same facility. Pt unable to return to independent living at this time and will need rehab.  CSW called facility and spoke with Candace about the patient returning for SNF. Pt will need Insurance Auth for SNF placement.  Social Worker assessment / plan:  CSW met with patient/spouse at bedside and discussed SNF placement/options. CSW answered questions about insurance covering skilled nursing needs.  Pt will receive short term rehab at [facility].  CSW discussed the insurance auth needed for SNF placement. CSW will f/u on same.  CSW will f/u on disposition.  Employment status:  Retired Nurse, adult PT Recommendations:  [place] / Referral to community resources:  [place]  Patient/Family's Response to care:  Patient/family voiced appreciation for assistance with SNF placement and  process. No issues or concerns.    Patient/Family's Understanding of and Emotional Response to Diagnosis, Current Treatment, and Prognosis:  Patient/family understands that patient is not independent with ambulation and will need skilled nursing rehab once he is discharged due to impairment and limitations. In addition, patient was in rehab at facility and is familiar with his need and he can obtain care at the independent living. CSW will continue to follow. No other issues or concerns.  Emotional Assessment Appearance:  Appears stated age Attitude/Demeanor/Rapport:  (Cooperative) Affect (typically observed):  Accepting Orientation:  Oriented to Situation, Oriented to  Time, Oriented to Place, Oriented to Self Alcohol / Substance use:  Not Applicable Psych involvement (Current and /or in the community):  No (Comment)  Discharge Needs  Concerns to be addressed:  Discharge Planning Concerns Readmission within the last 30 days:  Yes Current discharge risk:  Dependent with Mobility, Physical Impairment Barriers to Discharge:  No Barriers Identified   Normajean Baxter, LCSW 10/24/2017, 1:45 PM

## 2017-10-24 NOTE — Discharge Instructions (Signed)
Orthopedics discharge instructions:  - OK for full weightbearing as tolerated without restrictions to the left leg - Maintained postoperative bandages until follow-up appointment.  You may shower with these in place, but do not submerge underwater. - Take one aspirin daily for DVT prophylaxis for 6 weeks.

## 2017-10-24 NOTE — NC FL2 (Signed)
[place] LEVEL OF CARE SCREENING TOOL     IDENTIFICATION  Patient Name: [name] [name] Birthdate: Dec 10, 1928 Sex: male Admission Date (Current Location): 10/20/2017  [facility] and [place] Number:  Herbalist and Address:  [facility], [place] [address], [place], [place] [address]      Provider Number: [id number]  Attending Physician Name and Address:  Elmarie Shiley, MD  Relative Name and Phone Number:  Kameron Blethen, spouse, [phone]    Current Level of Care: Hospital Recommended Level of Care: [place] Prior Approval Number:    Date Approved/Denied:   PASRR Number: [id number]  Discharge Plan: SNF    Current Diagnoses: Patient Active Problem List   Diagnosis Date Noted  . Closed left hip fracture, initial encounter ([place]) 10/21/2017  . Severe malnutrition ([place]) 10/19/2017  . Hypoxia 10/19/2017  . Pulmonary hypertension ([place]) 10/19/2017  . NSTEMI (non-ST elevated myocardial infarction) ([place])   . Acute on chronic diastolic (congestive) heart failure ([place]) 10/15/2017  . Osteopenia 08/22/2017  . Acute on chronic diastolic heart failure ([place]) 06/25/2017  . Overflow incontinence 06/25/2017  . Vocal fold paralysis, left 06/07/2017  . Hypocalcemia 12/29/2016  . CAP (community acquired pneumonia) 12/17/2016  . Elevated troponin 12/17/2016  . CHF (congestive heart failure) ([place]) 12/17/2016  . Pressure ulcer of sacral region, stage 1 09/23/2016  . Permanent atrial fibrillation ([place]) 09/01/2016  . Chronic diastolic heart failure ([place]) 03/28/2016  . Diaphragmatic hernia 03/28/2016  . 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 Risk for falls 03/28/2016  . Paresthesia of left lower extremity 11/11/2015  . Hoarse 12/23/2014  . History of GI bleed 05/17/2014  . Rectal bleed 05/17/2014  . Hypotension 05/17/2014  . 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 Dizziness 04/05/2014  . SSS  (sick sinus syndrome) ([place]) 03/25/2014  . CAD S/P percutaneous coronary angioplasty -  03/24/2014  . Perivalvular leak of prosthetic heart valve   . Mitral stenosis   . Routine general medical examination at a health care facility 12/31/2013  . Solitary pulmonary nodule 12/08/2013  . Pleural effusion 12/05/2013  . Dyspnea 12/05/2013  . Pacemaker - Medtronic Adapta, dual chamber 11/19/2013  . Restrictive lung disease 03/30/2013  . Encounter for long-term (current) use of other medications 08/17/2012  . BASAL CELL CARCINOMA OF SKIN SITE UNSPECIFIED 09/28/2010  . PERSONAL HX COLONIC POLYPS 01/01/2010  . BARRETTS ESOPHAGUS 03/20/2009  . Hematuria 10/21/2008  . Iron deficiency anemia 10/13/2008  . Anemia 09/18/2008  . UNSPECIFIED VENOUS INSUFFICIENCY 09/18/2008  . ANXIETY 09/04/2008  . Laceration of leg 09/04/2008  . CEREBROVASCULAR ACCIDENT, HX OF 08/18/2008  . S/P CABG x 1 06/13/2008  . S/P mitral valve replacement with bioprosthetic valve 06/13/2008  . S/P Maze operation for atrial fibrillation 06/13/2008  . PROSTATE CANCER, HX OF 01/14/2008  . CATARACTS, BILATERAL, HX OF 01/14/2008  . Dyslipidemia 06/01/2007  . Essential hypertension 06/01/2007  . GERD 06/01/2007  . Unspecified essential hypertension 06/01/2007  . CAD (coronary artery disease) 06/01/2007    Orientation RESPIRATION BLADDER Height & Weight     Self, Time, Situation, Place  O2(nasal cannula 4L) Incontinent Weight: 147 lb 7.8 oz (66.9 kg) Height:  5\' 10"  (177.8 cm)  BEHAVIORAL SYMPTOMS/MOOD NEUROLOGICAL BOWEL NUTRITION STATUS      Continent Diet(please see DC summary)  AMBULATORY STATUS COMMUNICATION OF NEEDS Skin   Extensive Assist Verbally Surgical wounds  Personal Care Assistance Level of Assistance  Dressing, Feeding, Bathing Bathing Assistance: Maximum assistance Feeding assistance: Limited assistance Dressing Assistance: Maximum assistance     Functional Limitations Info   Sight, Hearing, Speech Sight Info: Adequate Hearing Info: Impaired Speech Info: Adequate    SPECIAL CARE FACTORS FREQUENCY  PT (By licensed PT), OT (By licensed OT)     PT Frequency: 5x week OT Frequency: 5x week            Contractures Contractures Info: Not present    Additional Factors Info  Code Status, Allergies Code Status Info: DNR Allergies Info: Amiodarone           Current Medications (10/24/2017):  This is the current hospital active medication list Current Facility-Administered Medications  Medication Dose Route Frequency Provider Last Rate Last Dose  . acetaminophen (TYLENOL) tablet 650 mg  650 mg Oral Q4H PRN Nicholes Stairs, MD   650 mg at 10/23/17 2331   Or  . acetaminophen (TYLENOL) suppository 650 mg  650 mg Rectal Q4H PRN Nicholes Stairs, MD      . aspirin tablet 325 mg  325 mg Oral Daily Nicholes Stairs, MD   325 mg at 10/24/17 1053  . ceFAZolin (ANCEF) IVPB 1 g/50 mL premix  1 g Intravenous Q6H Nicholes Stairs, MD 100 mL/hr at 10/24/17 1249 1 g at 10/24/17 1249  . feeding supplement (ENSURE ENLIVE) (ENSURE ENLIVE) liquid 237 mL  237 mL Oral TID BM [name], DO   237 mL at 10/24/17 1053  . furosemide (LASIX) tablet 40 mg  40 mg Oral Daily Jennette Kettle M, DO   40 mg at 10/24/17 1052  . heparin injection 5,000 Units  5,000 Units Subcutaneous Q8H [name], DO   5,000 Units at 10/24/17 [phone]  . HYDROcodone-acetaminophen (NORCO/VICODIN) 5-325 MG per tablet 1-2 tablet  1-2 tablet Oral Q6H PRN [name], DO   2 tablet at 10/23/17 1051  . metoCLOPramide (REGLAN) tablet 5-10 mg  5-10 mg Oral Q8H PRN Nicholes Stairs, MD       Or  . metoCLOPramide [facility]) injection 5-10 mg  5-10 mg Intravenous Q8H PRN Nicholes Stairs, MD      . metoprolol succinate (TOPROL-XL) 24 hr tablet 25 mg  25 mg Oral Daily Jennette Kettle M, DO   25 mg at 10/24/17 1052  . morphine 4 MG/ML injection 0.52 mg  0.52 mg Intravenous Q2H  PRN [name], DO      . multivitamin with minerals tablet 1 tablet  1 tablet Oral Daily Jennette Kettle M, DO   1 tablet at 10/24/17 1052  . ondansetron (ZOFRAN) tablet 4 mg  4 mg Oral Q6H PRN Nicholes Stairs, MD       Or  . ondansetron [facility]) injection 4 mg  4 mg Intravenous Q6H PRN Nicholes Stairs, MD      . pantoprazole (PROTONIX) EC tablet 40 mg  40 mg Oral Daily Jennette Kettle M, DO   40 mg at 10/24/17 1052  . simvastatin (ZOCOR) tablet 40 mg  40 mg Oral QHS Jennette Kettle M, DO   40 mg at 10/23/17 2331  . tamsulosin (FLOMAX) capsule 0.4 mg  0.4 mg Oral QHS Jennette Kettle M, DO   0.4 mg at 10/23/17 2330     Discharge Medications: Please see discharge summary for a list of discharge medications.  Relevant Imaging Results:  Relevant Lab Results:   Additional Information SSN: [id number]  [place], LCSW

## 2017-10-24 NOTE — Social Work (Signed)
CSW called [facility] to confirm that patient was from independent living and would be able to get into the skilled nursing side if recommended by therapy. CSW unable to reach a person and left message for Candace in admissions.  Pt has [place] and will need [employer] fast streamer for skilled nursing care. CSW will await therapy notes and follow up.  Elissa Hefty, LCSW Clinical Social Worker [phone]

## 2017-10-25 MED ORDER — HYDROCODONE-ACETAMINOPHEN 5-325 MG PO TABS: 1.0000 | ORAL_TABLET | Freq: Four times a day (QID) | ORAL | 0 refills | Status: AC | PRN

## 2017-10-25 MED ORDER — ACETAMINOPHEN 325 MG PO TABS: 650.0000 mg | ORAL_TABLET | ORAL | 0 refills | Status: AC | PRN

## 2017-10-25 MED ORDER — ASPIRIN 325 MG PO TABS: 325.0000 mg | ORAL_TABLET | Freq: Every day | ORAL | 0 refills | Status: AC

## 2017-10-25 NOTE — Clinical Social Work Placement (Signed)
   CLINICAL SOCIAL WORK PLACEMENT  NOTE  Date:  10/25/2017  Patient Details  Name: [name] [name] MRN: [id number] Date of Birth: April 25, 1929  Clinical Social Work is seeking post-discharge placement for this patient at the [place] level of care (*CSW will initial, date and re-position this form in  chart as items are completed):  Yes   Patient/family provided with [place] Work Department's list of facilities offering this level of care within the geographic area requested by the patient (or if unable, by the patient's family).  Yes   Patient/family informed of their freedom to choose among providers that offer the needed level of care, that participate in Medicare, Medicaid or managed care program needed by the patient, have an available bed and are willing to accept the patient.  Yes   Patient/family informed of [facility]'s ownership interest in [facility] and [facility], as well as of the fact that they are under no obligation to receive care at these facilities.  PASRR submitted to EDS on       PASRR number received on       Existing PASRR number confirmed on 10/24/17     FL2 transmitted to all facilities in geographic area requested by pt/family on       FL2 transmitted to all facilities within larger geographic area on 10/24/17     Patient informed that his/her managed care company has contracts with or will negotiate with certain facilities, including the following:        Yes   Patient/family informed of bed offers received.  Patient chooses bed at [facility]     Physician recommends and patient chooses bed at      Patient to be transferred to [facility] at [place] on 10/25/17.  Patient to be transferred to facility by [facility]     Patient family notified on 10/25/17 of transfer.  Name of family member notified:  spouse notified     PHYSICIAN Please prepare priority discharge summary, including  medications, Please prepare prescriptions     Additional Comment:    _______________________________________________ Normajean Baxter, LCSW 10/25/2017, 10:38 AM

## 2017-10-25 NOTE — Social Work (Signed)
Clinical Social Worker facilitated patient discharge including contacting patient family and facility to confirm patient discharge plans.  Clinical information faxed to facility and family agreeable with plan.    CSW arranged ambulance transport via [facility] to [place] at [facility].    RN to call [phone] to give report prior to discharge.  Clinical Social Worker will sign off for now as social work intervention is no longer needed. Please consult [place] again if new need arises.  Elissa Hefty, LCSW Clinical Social Worker [phone]

## 2017-10-25 NOTE — Discharge Summary (Signed)
Physician Discharge Summary  [name] [name] ZDG:[id number] DOB: January 14, 1929 DOA: 10/20/2017  PCP: [name], MD  Admit date: 10/20/2017 Discharge date: 10/25/2017  Admitted From: [facility]F Disposition:  SNF  Recommendations for Outpatient Follow-up:  1. Follow up with PCP in 1-2 weeks 2. Please obtain BMP/CBC in one week 3. Please follow with Dr Stann Mainland post op   Discharge Condition: stable CODE STATUS: DNR Diet recommendation: Heart Healthy  Brief/Interim Summary:  Pt admitted from SNF with severe left leg pain. He was found to have  displaced left femoral intertrochanteric fracture. Plan for surgery per ortho schedule. Underwent sx 12-17    Assessment & Plan:   Principal Problem:   Closed left hip fracture, initial encounter [facility]) Active Problems:   Pacemaker - Medtronic Adapta, dual chamber   Chronic diastolic heart failure (HCC)   CAD (coronary artery disease)   Closed left hip fracture;  X ray showed mildly displaced left femoral intertrochanteric fracture. Underwent sx 12-17, treatment of intertrochanteric peri and sub trochanteric fracture with intramedullary implant.  PT per Ortho, recommending SNF.  DVT prophylaxis per ortho. aspirin and ok to resume plavix Stable for discharge   Pacemaker - Medtronic Adapta, dual chamber / Chronic diastolic heart failure Continue with lasix.   CAD; continue with metoprolol. And resume plavix  Dyslipidemia; continue with Zocor.      Discharge Diagnoses:  Principal Problem:   Closed left hip fracture, initial encounter [facility]) Active Problems:   Pacemaker - Medtronic Adapta, dual chamber   Chronic diastolic heart failure (HCC)   CAD (coronary artery disease)    Discharge Instructions  Discharge Instructions    Diet - low sodium heart healthy   Complete by:  As directed    Increase activity slowly   Complete by:  As directed      Allergies as of 10/25/2017      Reactions   Amiodarone    Thyroid disorder       Medication List    TAKE these medications   acetaminophen 325 MG tablet Commonly known as:  TYLENOL Take 2 tablets (650 mg total) by mouth every 4 (four) hours as needed for mild pain ((score 1 to 3) or temp > 100.5).   aspirin 325 MG tablet Take 1 tablet (325 mg total) by mouth daily. Start taking on:  10/26/2017   clopidogrel 75 MG tablet Commonly known as:  PLAVIX Take 1 tablet (75 mg total) by mouth daily with breakfast.   feeding supplement (ENSURE ENLIVE) Liqd Take 237 mLs by mouth 3 (three) times daily between meals.   furosemide 40 MG tablet Commonly known as:  LASIX Take 1 tablet (40 mg total) by mouth daily.   HYDROcodone-acetaminophen 5-325 MG tablet Commonly known as:  NORCO/VICODIN Take 1-2 tablets by mouth every 6 (six) hours as needed for moderate pain.   metoprolol succinate 25 MG 24 hr tablet Commonly known as:  TOPROL XL Take 1 tablet (25 mg total) by mouth daily.   multivitamin with minerals Tabs tablet Take 1 tablet by mouth daily. Centrum Silver   pantoprazole 40 MG tablet Commonly known as:  PROTONIX Take 1 tablet (40 mg total) by mouth daily.   potassium chloride SA 20 MEQ tablet Commonly known as:  K-DUR,KLOR-CON Take 20 mEq by mouth daily.   simvastatin 40 MG tablet Commonly known as:  ZOCOR Take 1 tablet (40 mg total) by mouth at bedtime.   tamsulosin 0.4 MG Caps capsule Commonly known as:  FLOMAX Take 0.4 mg by mouth  at bedtime.   triamcinolone 0.025 % cream Commonly known as:  KENALOG Apply 1 application topically 3 (three) times daily as needed. For rash      Follow-up Information    Nicholes Stairs, MD Follow up in 2 week(s).   Specialty:  Orthopedic Surgery Why:  For wound re-check, For suture removal Contact information: [address] [place] [address] [phone]          Allergies  Allergen Reactions  . Amiodarone     Thyroid disorder    Consultations:  Dr Stann Mainland.     Procedures/Studies: Dg Chest 1 View  Result Date: 10/18/2017 CLINICAL DATA:  Post thoracentesis EXAM: CHEST 1 VIEW COMPARISON:  Chest radiograph October 17, 2017 FINDINGS: Note that a portion of the left apex is not seen due to patient's mandible overlying this area. No pneumothorax appreciable. Right pleural effusion is smaller compared to 1 day prior. There is a new left pleural effusion, however. There is bibasilar consolidation, more on the left than on the right. There is cardiomegaly with pacemaker leads attached to the right atrium and right ventricle. There is an aortic valve replacement. Patient is status post median sternotomy. There is aortic atherosclerosis. No adenopathy evident. Bones are osteoporotic. IMPRESSION: Right pleural effusion smaller. No pneumothorax. New left pleural effusion. There is bibasilar consolidation. Stable cardiomegaly. There is aortic atherosclerosis. Aortic Atherosclerosis (ICD10-I70.0). Electronically Signed   By: Lowella Grip III M.D.   On: 10/18/2017 13:11   Dg Chest 2 View  Result Date: 10/21/2017 CLINICAL DATA:  Status post fall, with concern for chest injury. Initial encounter. EXAM: CHEST  2 VIEW COMPARISON:  Chest radiograph performed 10/18/2017 FINDINGS: A moderate right-sided pleural effusion is noted. Vascular congestion is seen, with bilateral central airspace opacities. This may reflect pulmonary edema or possibly pneumonia. No pneumothorax is seen. The cardiomediastinal silhouette is mildly enlarged. A pacemaker is noted at the left chest wall, with leads ending at the right atrium and right ventricle. IMPRESSION: 1. Moderate right-sided pleural effusion. Vascular congestion and mild cardiomegaly, with bilateral central airspace opacities. This may reflect pulmonary edema or possibly pneumonia. 2. No displaced rib fracture seen. Electronically Signed   By: Garald Balding M.D.   On: 10/21/2017 01:33   Dg Chest 2 View  Result Date:  10/17/2017 CLINICAL DATA:  Shortness of Breath EXAM: CHEST  2 VIEW COMPARISON:  10/15/2017 FINDINGS: Moderate right pleural effusion again noted. There is cardiomegaly with bilateral perihilar and lower lobe airspace opacities, favor edema/ CHF. Prior aortic valve repair. Left pacer is unchanged. IMPRESSION: Continued CHF pattern.  Moderate right pleural effusion. Electronically Signed   By: Rolm Baptise M.D.   On: 10/17/2017 09:43   Dg Chest Port 1 View  Result Date: 10/15/2017 CLINICAL DATA:  83 year old male with increased shortness of breath. EXAM: PORTABLE CHEST 1 VIEW COMPARISON:  06/25/2017 FINDINGS: Stable do lead left-sided AICD device. The patient is status post median sternotomy Ann T AVR. Cardiomediastinal silhouette is enlarged but stable. There has been interval increase of patchy opacity in the left mid lung. Right basilar effusion with associated atelectasis, with or without superimposed consolidation. This is stable from prior study. No pneumothorax. No acute osseous abnormalities. IMPRESSION: 1. Increasing left mid lung patchy opacity, focal edema versus consolidation. 2. Continued right pleural effusion is study stated atelectasis, with or without superimposed consolidation. 3. Other stable signs of CHF including cardiomegaly and vascular congestion. Electronically Signed   By: Kristopher Oppenheim M.D.   On:  10/15/2017 09:39   Dg C-arm 1-60 Min  Result Date: 10/23/2017 CLINICAL DATA:  Fracture EXAM: LEFT FEMUR 2 VIEWS; DG C-ARM 61-120 MIN COMPARISON:  10/20/2017 FINDINGS: Four low resolution intraoperative spot views of the left femur. Total fluoroscopy time was 41 seconds. The images demonstrate intramedullary rod and screw fixation of the left femur for intertrochanteric fracture. IMPRESSION: Intraoperative fluoroscopic assistance provided during surgical fixation of proximal left femur fracture Electronically Signed   By: Donavan Foil M.D.   On: 10/23/2017 22:23   Dg Hip Unilat With  Pelvis 2-3 Views Left  Result Date: 10/21/2017 CLINICAL DATA:  Status post fall, with left hip pain. Initial encounter. EXAM: DG HIP (WITH OR WITHOUT PELVIS) 2-3V LEFT COMPARISON:  None. FINDINGS: There is a mildly displaced left femoral intertrochanteric fracture. The femoral heads remain seated within their respective acetabula. No additional fractures are seen. Diffuse calcification is seen along the abdominal aorta and its branches. The visualized bowel gas pattern is grossly unremarkable. The sacroiliac joints are not well characterized, but appear grossly unremarkable. The sacrum is difficult to assess. IMPRESSION: 1. Mildly displaced left femoral intertrochanteric fracture. 2. Diffuse aortic atherosclerosis. Electronically Signed   By: Garald Balding M.D.   On: 10/21/2017 01:38   Dg Femur Min 2 Views Left  Result Date: 10/23/2017 CLINICAL DATA:  Fracture EXAM: LEFT FEMUR 2 VIEWS; DG C-ARM 61-120 MIN COMPARISON:  10/20/2017 FINDINGS: Four low resolution intraoperative spot views of the left femur. Total fluoroscopy time was 41 seconds. The images demonstrate intramedullary rod and screw fixation of the left femur for intertrochanteric fracture. IMPRESSION: Intraoperative fluoroscopic assistance provided during surgical fixation of proximal left femur fracture Electronically Signed   By: Donavan Foil M.D.   On: 10/23/2017 22:23   Ir Thoracentesis Asp Pleural Space W/img Guide  Result Date: 10/18/2017 INDICATION: Patient with right pleural effusion. Request is made for therapeutic thoracentesis. EXAM: ULTRASOUND GUIDED THERAPEUTIC RIGHT THORACENTESIS MEDICATIONS: 10 mL 2% lidocaine COMPLICATIONS: None immediate. PROCEDURE: An ultrasound guided thoracentesis was thoroughly discussed with the patient and questions answered. The benefits, risks, alternatives and complications were also discussed. The patient understands and wishes to proceed with the procedure. Written consent was obtained. Ultrasound  was performed to localize and mark an adequate pocket of fluid in the right chest. The area was then prepped and draped in the normal sterile fashion. 2% Lidocaine was used for local anesthesia. Under ultrasound guidance a Safe-T-Centesis catheter was introduced. Thoracentesis was performed. The catheter was removed and a dressing applied. FINDINGS: A total of approximately 2.0 liters of amber fluid was removed. IMPRESSION: Successful ultrasound guided right therapeutic thoracentesis yielding 2.0 liters of pleural fluid. Read by:  Brynda Greathouse PA-C Electronically Signed   By: Markus Daft M.D.   On: 10/18/2017 15:34      Subjective: He is feeling well. Denies dyspnea.   Discharge Exam: Vitals:   10/24/17 2017 10/25/17 0758  BP: 126/72 127/75  Pulse: 90 86  Resp: 18 18  Temp: 98 F (36.7 C) 99.1 F (37.3 C)  SpO2: 92% 100%   Vitals:   10/24/17 0700 10/24/17 1510 10/24/17 2017 10/25/17 0758  BP: 110/64 (!) 142/69 126/72 127/75  Pulse: 82 75 90 86  Resp: 18 16 18 18   Temp: 97.7 F (36.5 C) 97.9 F (36.6 C) 98 F (36.7 C) 99.1 F (37.3 C)  TempSrc: Oral Oral Oral Oral  SpO2: 98% 93% 92% 100%  Weight:      Height:  General: Pt is alert, awake, not in acute distress Cardiovascular: RRR, S1/S2 +, no rubs, no gallops Respiratory: CTA bilaterally, no wheezing, no rhonchi Abdominal: Soft, NT, ND, bowel sounds + Extremities: no edema, no cyanosis    The results of significant diagnostics from this hospitalization (including imaging, microbiology, ancillary and laboratory) are listed below for reference.     Microbiology: Recent Results (from the past 240 hour(s))  MRSA PCR Screening     Status: None   Collection Time: 10/15/17  1:29 PM  Result Value Ref Range Status   MRSA by PCR NEGATIVE NEGATIVE Final    Comment:        The GeneXpert MRSA Assay (FDA approved for NASAL specimens only), is one component of a comprehensive MRSA colonization surveillance program. It  is not intended to diagnose MRSA infection nor to guide or monitor treatment for MRSA infections.   Surgical pcr screen     Status: None   Collection Time: 10/22/17  9:49 AM  Result Value Ref Range Status   MRSA, PCR NEGATIVE NEGATIVE Final   Staphylococcus aureus NEGATIVE NEGATIVE Final    Comment: (NOTE) The Xpert SA Assay (FDA approved for NASAL specimens in patients 24 years of age and older), is one component of a comprehensive surveillance program. It is not intended to diagnose infection nor to guide or monitor treatment.      Labs: BNP (last 3 results) Recent Labs    06/25/17 1318 10/15/17 0941 10/19/17 0509  BNP 447.4* 635.3* 540.0*   Basic Metabolic Panel: Recent Labs  Lab 10/19/17 0509 10/20/17 2322 10/22/17 0708 10/23/17 0620 10/24/17 0650  NA 144 141 143 142 140  K 3.9 3.7 3.8 4.1 4.3  CL 94* 98* 96* 97* 96*  CO2 45* 37* 41* 40* 36*  GLUCOSE 111* 110* 109* 95 105*  BUN 22* 28* 29* 27* 31*  CREATININE 0.78 0.77 0.73 0.80 0.89  CALCIUM 8.8* 8.1* 8.4* 8.3* 8.2*   Liver Function Tests: No results for input(s): AST, ALT, ALKPHOS, BILITOT, PROT, ALBUMIN in the last 168 hours. No results for input(s): LIPASE, AMYLASE in the last 168 hours. No results for input(s): AMMONIA in the last 168 hours. CBC: Recent Labs  Lab 10/20/17 2322 10/22/17 0708 10/23/17 0620 10/24/17 0650  WBC 6.9 5.3 5.0 6.9  NEUTROABS 5.5  --   --   --   HGB 9.5* 8.6* 8.4* 8.7*  HCT 31.3* 28.6* 28.1* 29.1*  MCV 89.9 90.8 91.2 91.8  PLT 129* 121* 135* 149*   Cardiac Enzymes: No results for input(s): CKTOTAL, CKMB, CKMBINDEX, TROPONINI in the last 168 hours. BNP: Invalid input(s): POCBNP CBG: No results for input(s): GLUCAP in the last 168 hours. D-Dimer No results for input(s): DDIMER in the last 72 hours. Hgb A1c No results for input(s): HGBA1C in the last 72 hours. Lipid Profile No results for input(s): CHOL, HDL, LDLCALC, TRIG, CHOLHDL, LDLDIRECT in the last 72  hours. Thyroid function studies No results for input(s): TSH, T4TOTAL, T3FREE, THYROIDAB in the last 72 hours.  Invalid input(s): FREET3 Anemia work up No results for input(s): VITAMINB12, FOLATE, FERRITIN, TIBC, IRON, RETICCTPCT in the last 72 hours. Urinalysis    Component Value Date/Time   COLORURINE YELLOW 06/25/2017 1322   APPEARANCEUR CLEAR 06/25/2017 1322   LABSPEC 1.009 06/25/2017 1322   PHURINE 5.0 06/25/2017 1322   GLUCOSEU NEGATIVE 06/25/2017 1322   GLUCOSEU NEGATIVE 04/03/2015 1106   HGBUR NEGATIVE 06/25/2017 1322   HGBUR large 10/21/2008 0911   BILIRUBINUR NEGATIVE  06/25/2017 1322   KETONESUR NEGATIVE 06/25/2017 1322   PROTEINUR NEGATIVE 06/25/2017 1322   UROBILINOGEN 1.0 04/03/2015 2021   NITRITE NEGATIVE 06/25/2017 1322   LEUKOCYTESUR NEGATIVE 06/25/2017 1322   Sepsis Labs Invalid input(s): PROCALCITONIN,  WBC,  LACTICIDVEN Microbiology Recent Results (from the past 240 hour(s))  MRSA PCR Screening     Status: None   Collection Time: 10/15/17  1:29 PM  Result Value Ref Range Status   MRSA by PCR NEGATIVE NEGATIVE Final    Comment:        The GeneXpert MRSA Assay (FDA approved for NASAL specimens only), is one component of a comprehensive MRSA colonization surveillance program. It is not intended to diagnose MRSA infection nor to guide or monitor treatment for MRSA infections.   Surgical pcr screen     Status: None   Collection Time: 10/22/17  9:49 AM  Result Value Ref Range Status   MRSA, PCR NEGATIVE NEGATIVE Final   Staphylococcus aureus NEGATIVE NEGATIVE Final    Comment: (NOTE) The Xpert SA Assay (FDA approved for NASAL specimens in patients 3 years of age and older), is one component of a comprehensive surveillance program. It is not intended to diagnose infection nor to guide or monitor treatment.      Time coordinating discharge: Over 30 minutes  SIGNED:   Elmarie Shiley, MD  [facility] 10/25/2017, 10:36 AM Pager  [phone]  If 7PM-7AM, please contact night-coverage [email or web address] Password [email or web address]

## 2017-10-25 NOTE — Progress Notes (Signed)
Physical Therapy Treatment Patient Details Name: [name] [name] MRN: [id number] DOB: April 16, 1929 Today's Date: 10/25/2017    History of Present Illness Pt is an 83 y.o. male who recently discharged to SNF for rehab s/p mechanical fall resulting in L femoral intertrochanteric fracture. Now s/p intertrochanteric IM nail 10/23/17. PMH includes CHF, CAD, AVS s/p TAVR, sick sinus syndrome s/p pacemaker placement, and atrial fibrillation.    PT Comments    Patient is making gradual progress toward mobility goals. Pt is eager to participate in therapy and wants to return to PLOF. Continue to progress as tolerated with anticipated d/c to SNF for further skilled PT services.     Follow Up Recommendations  SNF;Supervision/Assistance - 24 hour     Equipment Recommendations  None recommended by PT    Recommendations for Other Services       Precautions / Restrictions Precautions Precautions: Fall Restrictions Weight Bearing Restrictions: Yes LLE Weight Bearing: Weight bearing as tolerated    Mobility  Bed Mobility Overal bed mobility: Needs Assistance Bed Mobility: Supine to Sit     Supine to sit: Mod assist     General bed mobility comments: cues for sequencing and technique; assist to scoot hips toward EOB with use of chuck pad and to bring L LE to EOB  Transfers Overall transfer level: Needs assistance Equipment used: Rolling walker (2 wheeled) Transfers: Sit to/from Stand Sit to Stand: Mod assist         General transfer comment: assist to power up and gain balance upon standing; cues for safe hand placement; posterior lean in standing  Ambulation/Gait Ambulation/Gait assistance: Mod assist;+2 safety/equipment Ambulation Distance (Feet): 8 Feet Assistive device: Rolling walker (2 wheeled) Gait Pattern/deviations: Step-to pattern;Decreased weight shift to left;Leaning posteriorly;Trunk flexed Gait velocity: decreased   General Gait Details: cues for posture,  sequencing, and proximity of RW; assist for balance and advancing RW   Stairs            Wheelchair Mobility    Modified Rankin (Stroke Patients Only)       Balance Overall balance assessment: Needs assistance Sitting-balance support: No upper extremity supported;Feet supported Sitting balance-Leahy Scale: Fair     Standing balance support: Bilateral upper extremity supported Standing balance-Leahy Scale: Poor Standing balance comment: heavy reliance on UE support                            Cognition Arousal/Alertness: Awake/alert Behavior During Therapy: WFL for tasks assessed/performed Overall Cognitive Status: No family/caregiver present to determine baseline cognitive functioning                                        Exercises      General Comments        Pertinent Vitals/Pain Pain Assessment: Faces Faces Pain Scale: Hurts little more Pain Location: L hip Pain Descriptors / Indicators: Aching;Sore Pain Intervention(s): Limited activity within patient's tolerance;Monitored during session;Repositioned    Home Living                      Prior Function            PT Goals (current goals can now be found in the care plan section) Acute Rehab PT Goals PT Goal Formulation: With patient Time For Goal Achievement: 11/07/17 Potential to Achieve Goals: Good Progress towards PT goals:  Progressing toward goals    Frequency    Min 3X/week      PT Plan Current plan remains appropriate;Frequency needs to be updated    Co-evaluation              AM-PAC PT "6 Clicks" Daily Activity  Outcome Measure  Difficulty turning over in bed (including adjusting bedclothes, sheets and blankets)?: Unable Difficulty moving from lying on back to sitting on the side of the bed? : Unable Difficulty sitting down on and standing up from a chair with arms (e.g., wheelchair, bedside commode, etc,.)?: Unable Help needed moving to  and from a bed to chair (including a wheelchair)?: A Lot Help needed walking in hospital room?: A Lot Help needed climbing 3-5 steps with a railing? : Total 6 Click Score: 8    End of Session Equipment Utilized During Treatment: Gait belt;Oxygen Activity Tolerance: Patient tolerated treatment well Patient left: in chair;with call bell/phone within reach;with chair alarm set Nurse Communication: Mobility status PT Visit Diagnosis: Unsteadiness on feet (R26.81);Other abnormalities of gait and mobility (R26.89);Pain;Muscle weakness (generalized) (M62.81);History of falling (Z91.81) Pain - Right/Left: Left Pain - part of body: Hip     Time: 1610-9604 PT Time Calculation (min) (ACUTE ONLY): 32 min  Charges:  $Gait Training: 8-22 mins $Therapeutic Activity: 8-22 mins                    G Codes:       Earney Navy, PTA Pager: [phone]     Darliss Cheney 10/25/2017, 10:20 AM

## 2017-10-26 ENCOUNTER — Ambulatory Visit: Payer: Medicare Other | Admitting: Cardiology

## 2017-10-26 LAB — TYPE AND SCREEN
ABO/RH(D): B NEG
ANTIBODY SCREEN: NEGATIVE
UNIT DIVISION: 0
UNIT DIVISION: 0

## 2017-10-26 LAB — BPAM RBC
BLOOD PRODUCT EXPIRATION DATE: 201812262359
BLOOD PRODUCT EXPIRATION DATE: 201901022359
UNIT TYPE AND RH: 1700
UNIT TYPE AND RH: 1700

## 2017-11-08 ENCOUNTER — Encounter: Payer: Self-pay | Admitting: Endocrinology

## 2017-11-08 NOTE — Progress Notes (Unsigned)
Flu vaccine completed

## 2017-11-22 ENCOUNTER — Ambulatory Visit: Payer: Medicare Other | Admitting: Cardiovascular Disease

## 2017-11-22 ENCOUNTER — Ambulatory Visit: Payer: Medicare Other | Admitting: Endocrinology

## 2017-11-29 ENCOUNTER — Ambulatory Visit: Payer: Medicare Other | Admitting: Cardiovascular Disease

## 2017-11-30 ENCOUNTER — Encounter: Payer: Self-pay | Admitting: *Deleted

## 2017-12-08 DEATH — deceased

## 2018-04-18 IMAGING — CT CT NECK W/ CM
3 of 5 series · 10 of 27 positions shown, 12 images · IV contrast (agent unspecified)
Comparison: CT of the cervical spine dated 09/03/2015

CLINICAL DATA: 88 y/o  M; left vocal cord paralysis.

EXAM:
CT NECK WITH CONTRAST
TECHNIQUE: Multidetector CT imaging of the neck was performed using the
standard protocol following the bolus administration of intravenous
contrast.
CONTRAST:  75 cc Bsovue-SMM

[Series 5: sag · sagittal · 0.44mm/px · 5 of 137 slices shown, 6 images]
[im 46/137  bone]
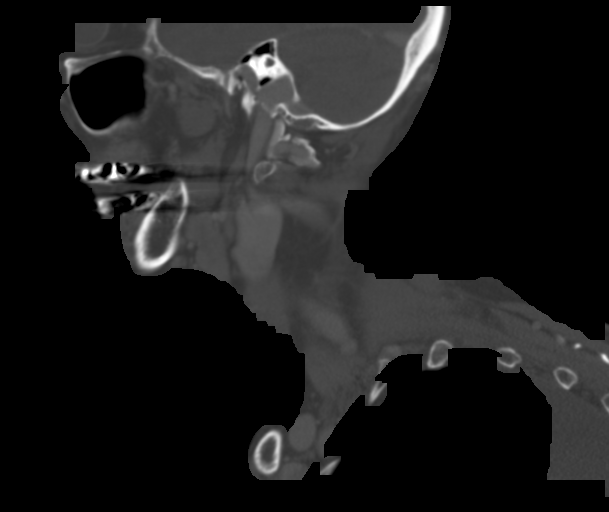
[im 57/137  bone]
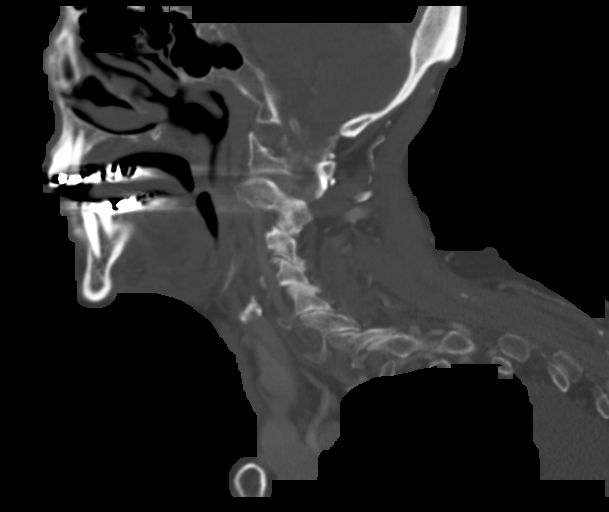
[im 69/137  soft-tissue]
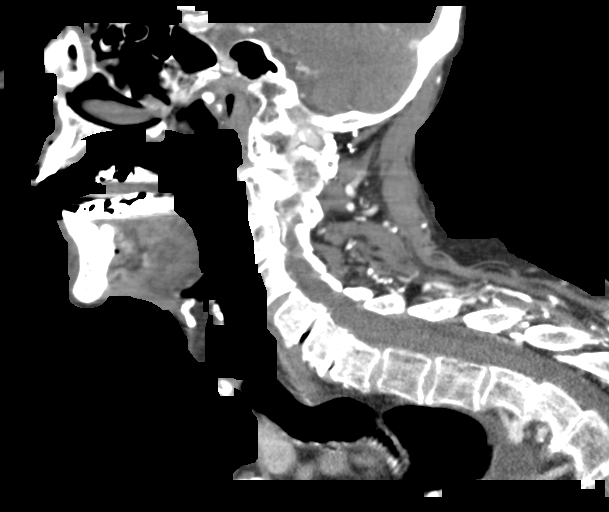
[im 69/137  bone]
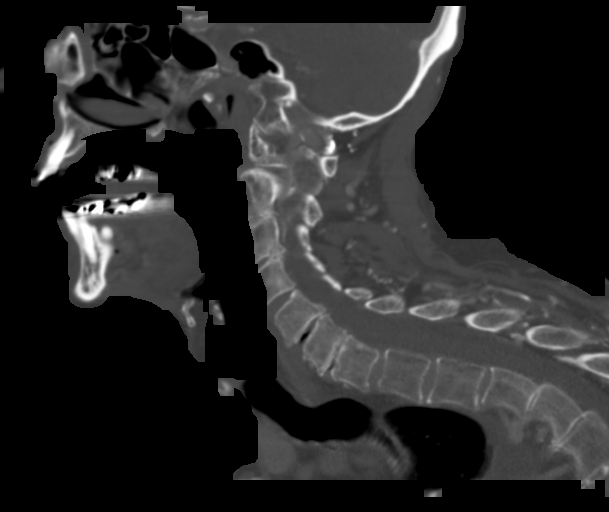
[im 80/137  bone]
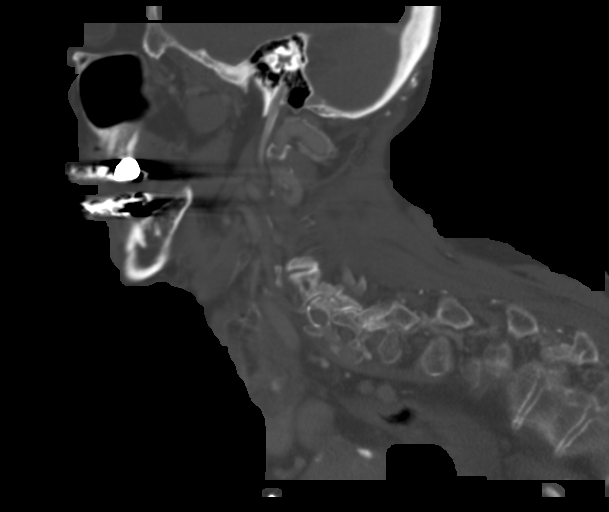
[im 91/137  bone]
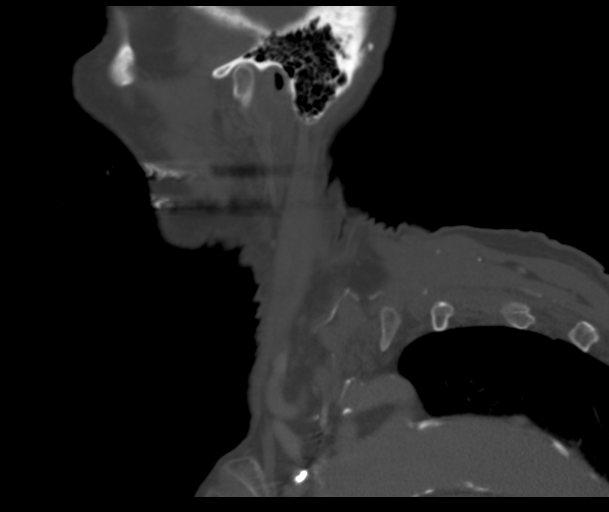

[Series 7: angled axial-oropharynx · axial · 0.51mm/px · z∈[-269,-197]mm · 2 of 108 slices shown]
[im 36/108  bone]
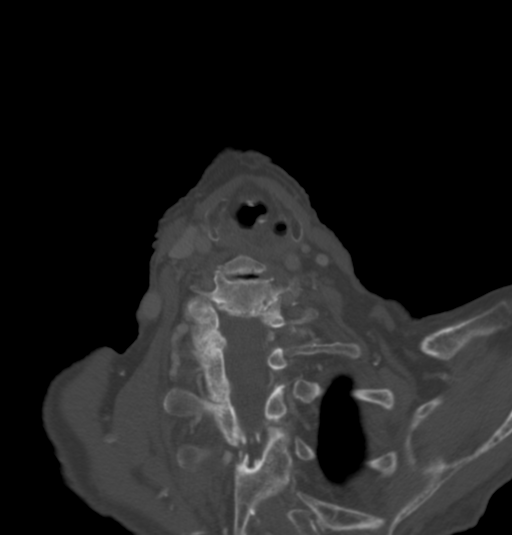
[im 72/108  bone]
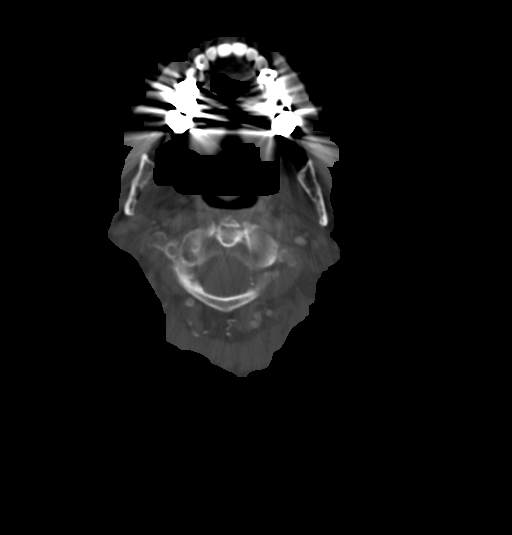

[Series 8: angled axial-dental · axial · 0.51mm/px · z∈[-354,-236]mm · 3 of 134 slices shown, 4 images]
[im 34/134  soft-tissue]
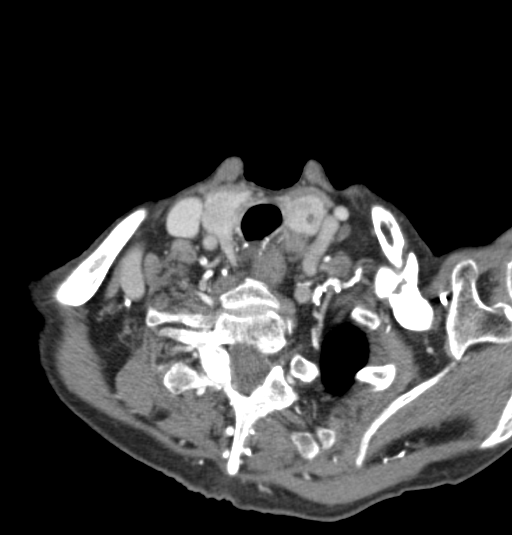
[im 34/134  bone]
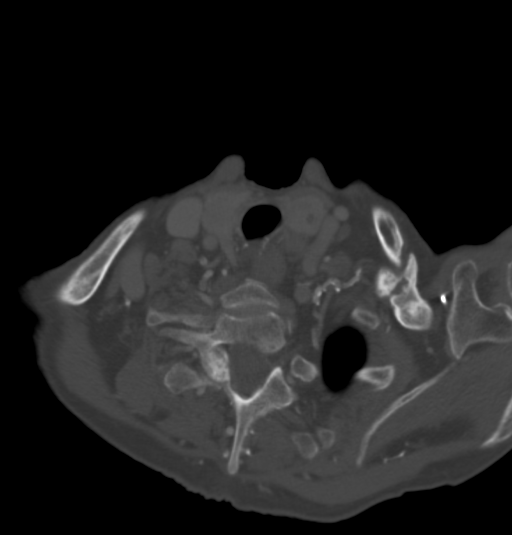
[im 67/134  bone]
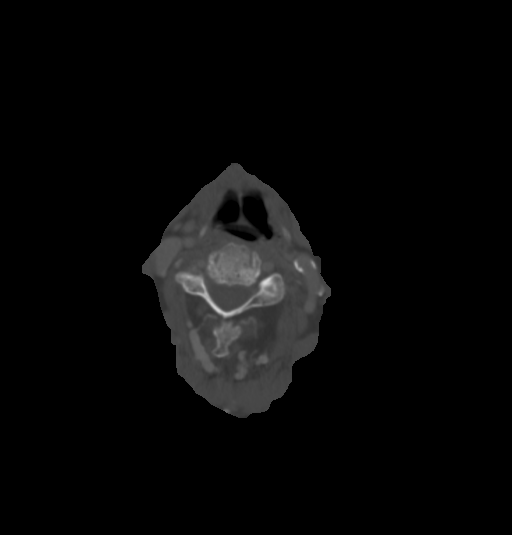
[im 100/134  bone]
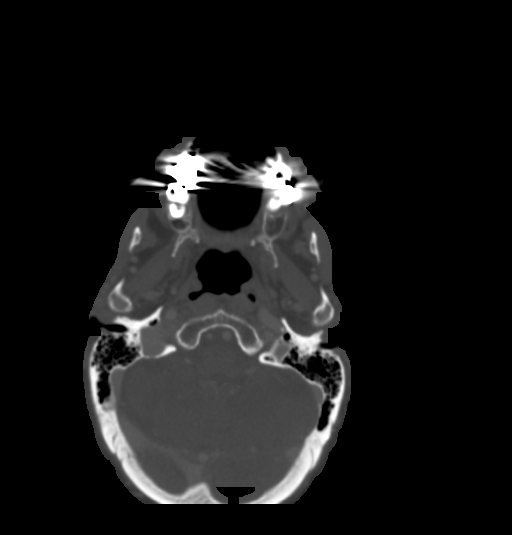

[10 of 27 positions shown; findings below may reference images not displayed]

FINDINGS: Pharynx and larynx: Enlarged left piriform sinus and "Sail sign"
compatible with left vocal cord paralysis. Findings of vocal cord
paralysis may have been present on the 6054 CT of the cervical spine
although less prevalent. No mass or lesion identified along the
course of cranial nerves 10 or recurrent laryngeal nerve. No
exophytic mucosal mass or swelling identified.

Salivary glands: No inflammation, mass, or stone.

Thyroid: Multinodular thyroid the largest within the left lobe of
the thyroid measure up to 10 mm.

Lymph nodes: None enlarged or abnormal density.

Vascular: The ascending thoracic aorta measures 4.0 cm. Moderate
calcific atherosclerosis of the aortic arch. Carotid and vertebral
arteries of the neck are patent. Calcified plaque of bilateral
carotid bifurcations without high-grade stenosis.

Limited intracranial: Negative.

Visualized orbits: Bilateral intra-ocular lens replacement.

Mastoids and visualized paranasal sinuses: Visualized paranasal
sinuses and mastoid air cells are normally aerated.

Skeleton: No acute or aggressive process. Moderate multilevel
cervical spondylosis with disc and facet degenerative changes
greatest at the C5 through C7 levels. No high-grade bony canal
stenosis.

Upper chest: Moderate right pleural effusion. Patulous and
fluid-filled esophagus and upper mediastinum.

Other: None.
IMPRESSION: 1. Left vocal cord paralysis. Similar findings may have been present
on the 6054 cervical spine CT although less prevalent. No mass or
lesion along the course of left cranial nerve 10 or recurrent
laryngeal nerve. No exophytic mucosal lesion of aerodigestive tract
identified.
2. Ascending thoracic aorta measures 4 cm. Recommend annual imaging
followup by CTA or MRA. This recommendation follows 2131
ACCF/AHA/AATS/ACR/ASA/SCA/AJ/ROLLE/AKLESIYA/PLASTER Guidelines for the
Diagnosis and Management of Patients with Thoracic Aortic Disease.
Circulation. 2131; 121: E266-e369.
3. Moderate cervical spondylosis greatest at C5 through C7 levels.
4. Moderate right pleural effusion.
5. Mildly patulous and fluid-filled upper mediastinal esophagus.

By: Ele Modi M.D.

## 2018-10-07 IMAGING — RF DG FEMUR 2+V*L*
1 series · 4 of 4 positions shown · non-contrast
Comparison: 10/20/2017

CLINICAL DATA: Fracture

EXAM:
LEFT FEMUR 2 VIEWS; DG C-ARM 61-120 MIN

[Series 1: run · 4 of 4 slices shown]
[im 1/4]
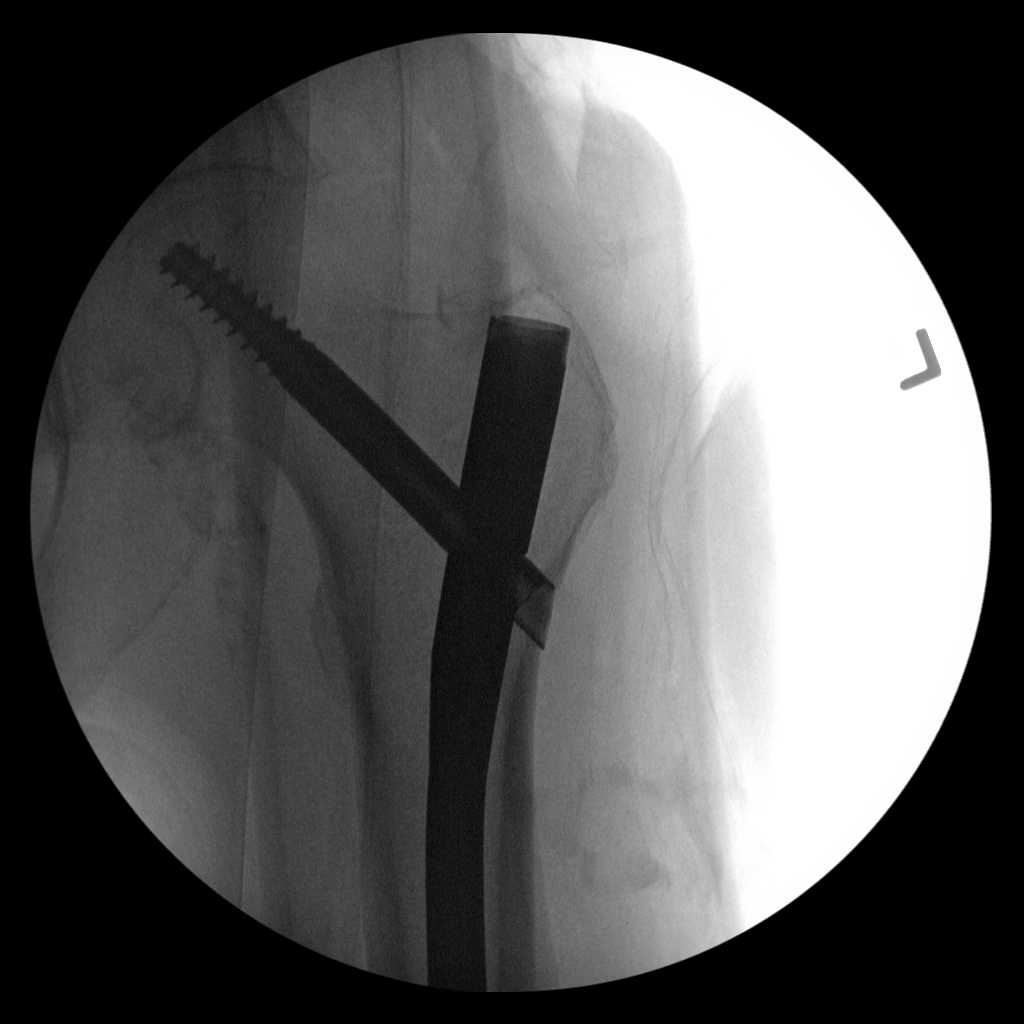
[im 2/4]
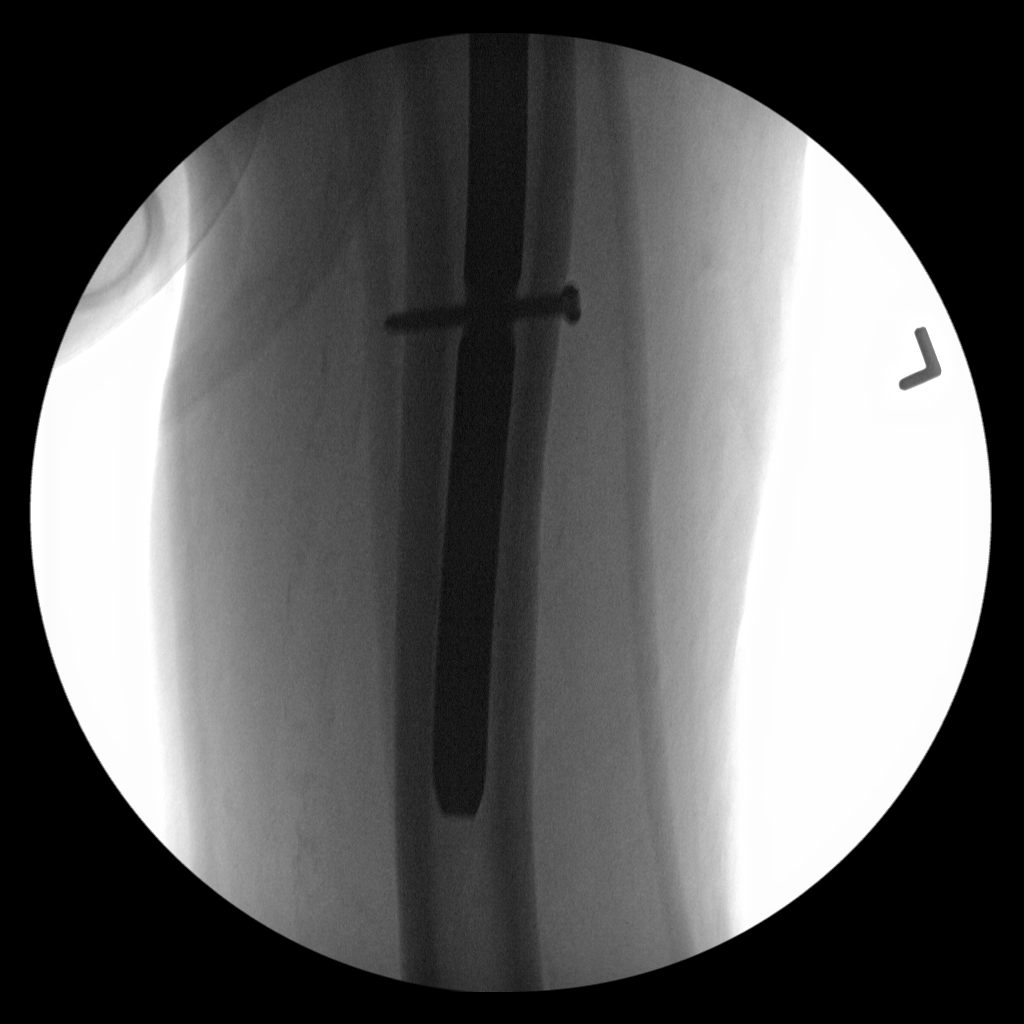
[im 3/4]
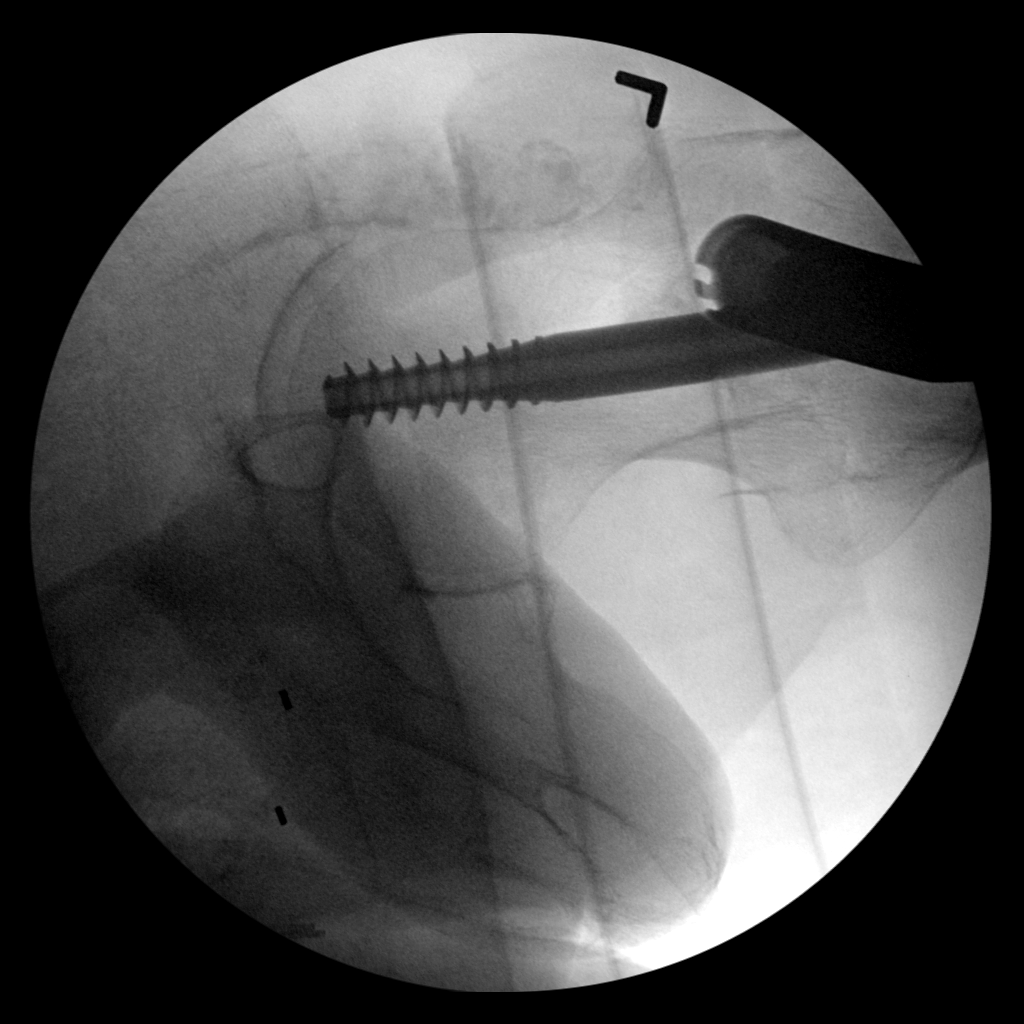
[im 4/4]
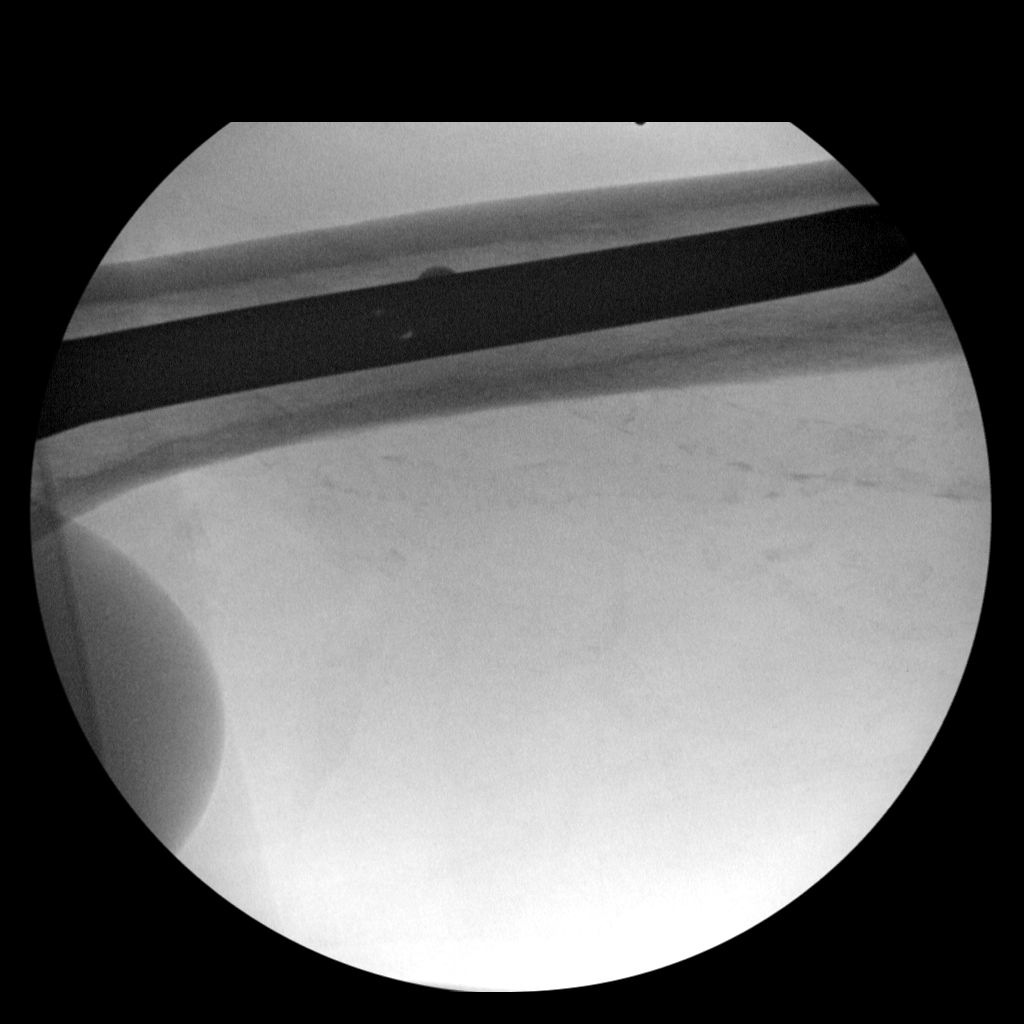

[4 of 4 positions shown; findings below may reference images not displayed]

FINDINGS: Four low resolution intraoperative spot views of the left femur.
Total fluoroscopy time was 41 seconds. The images demonstrate
intramedullary rod and screw fixation of the left femur for
intertrochanteric fracture.
IMPRESSION: Intraoperative fluoroscopic assistance provided during surgical
fixation of proximal left femur fracture
